# Patient Record
Sex: Male | Born: 1945
Health system: Southern US, Community
[De-identification: ages and names within clinical notes are randomized; demographics above are authoritative.]

## PROBLEM LIST (undated history)

## (undated) DIAGNOSIS — R011 Cardiac murmur, unspecified: Secondary | ICD-10-CM

## (undated) DIAGNOSIS — I509 Heart failure, unspecified: Secondary | ICD-10-CM

## (undated) DIAGNOSIS — M1990 Unspecified osteoarthritis, unspecified site: Secondary | ICD-10-CM

## (undated) DIAGNOSIS — R0902 Hypoxemia: Secondary | ICD-10-CM

## (undated) DIAGNOSIS — I2510 Atherosclerotic heart disease of native coronary artery without angina pectoris: Secondary | ICD-10-CM

## (undated) DIAGNOSIS — Z9289 Personal history of other medical treatment: Secondary | ICD-10-CM

## (undated) DIAGNOSIS — K5900 Constipation, unspecified: Secondary | ICD-10-CM

## (undated) DIAGNOSIS — H269 Unspecified cataract: Secondary | ICD-10-CM

## (undated) DIAGNOSIS — I10 Essential (primary) hypertension: Secondary | ICD-10-CM

## (undated) DIAGNOSIS — I4819 Other persistent atrial fibrillation: Secondary | ICD-10-CM

## (undated) DIAGNOSIS — E039 Hypothyroidism, unspecified: Secondary | ICD-10-CM

## (undated) DIAGNOSIS — G4733 Obstructive sleep apnea (adult) (pediatric): Secondary | ICD-10-CM

## (undated) DIAGNOSIS — C4490 Unspecified malignant neoplasm of skin, unspecified: Secondary | ICD-10-CM

## (undated) DIAGNOSIS — I340 Nonrheumatic mitral (valve) insufficiency: Secondary | ICD-10-CM

## (undated) DIAGNOSIS — Z86010 Personal history of colonic polyps: Secondary | ICD-10-CM

## (undated) DIAGNOSIS — I5030 Unspecified diastolic (congestive) heart failure: Secondary | ICD-10-CM

## (undated) DIAGNOSIS — E785 Hyperlipidemia, unspecified: Secondary | ICD-10-CM

## (undated) DIAGNOSIS — I7121 Aneurysm of the ascending aorta, without rupture: Secondary | ICD-10-CM

## (undated) DIAGNOSIS — I499 Cardiac arrhythmia, unspecified: Secondary | ICD-10-CM

## (undated) DIAGNOSIS — K759 Inflammatory liver disease, unspecified: Secondary | ICD-10-CM

## (undated) DIAGNOSIS — K635 Polyp of colon: Secondary | ICD-10-CM

## (undated) DIAGNOSIS — K922 Gastrointestinal hemorrhage, unspecified: Secondary | ICD-10-CM

## (undated) DIAGNOSIS — Z79899 Other long term (current) drug therapy: Secondary | ICD-10-CM

## (undated) DIAGNOSIS — I77810 Thoracic aortic ectasia: Secondary | ICD-10-CM

## (undated) DIAGNOSIS — I712 Thoracic aortic aneurysm, without rupture: Secondary | ICD-10-CM

## (undated) HISTORY — DX: Thoracic aortic aneurysm, without rupture: I71.2

## (undated) HISTORY — DX: Heart failure, unspecified: I50.9

## (undated) HISTORY — DX: Other persistent atrial fibrillation: I48.19

## (undated) HISTORY — DX: Gastrointestinal hemorrhage, unspecified: K92.2

## (undated) HISTORY — DX: Hypothyroidism, unspecified: E03.9

## (undated) HISTORY — DX: Inflammatory liver disease, unspecified: K75.9

## (undated) HISTORY — DX: Hypoxemia: R09.02

## (undated) HISTORY — DX: Essential (primary) hypertension: I10

## (undated) HISTORY — DX: Unspecified osteoarthritis, unspecified site: M19.90

## (undated) HISTORY — DX: Aneurysm of the ascending aorta, without rupture: I71.21

## (undated) HISTORY — DX: Cardiac murmur, unspecified: R01.1

## (undated) HISTORY — DX: Obstructive sleep apnea (adult) (pediatric): G47.33

## (undated) HISTORY — DX: Atherosclerotic heart disease of native coronary artery without angina pectoris: I25.10

## (undated) HISTORY — DX: Constipation, unspecified: K59.00

## (undated) HISTORY — PX: TONSILLECTOMY: SUR1361

## (undated) HISTORY — DX: Thoracic aortic ectasia: I77.810

## (undated) HISTORY — DX: Unspecified diastolic (congestive) heart failure: I50.30

## (undated) HISTORY — DX: Nonrheumatic mitral (valve) insufficiency: I34.0

## (undated) HISTORY — DX: Encounter for therapeutic drug level monitoring: Z79.899

## (undated) HISTORY — DX: Unspecified malignant neoplasm of skin, unspecified: C44.90

## (undated) HISTORY — DX: Personal history of colonic polyps: Z86.010

## (undated) HISTORY — DX: Hyperlipidemia, unspecified: E78.5

## (undated) HISTORY — DX: Unspecified cataract: H26.9

## (undated) HISTORY — DX: Polyp of colon: K63.5

## (undated) HISTORY — PX: COLONOSCOPY: SHX174

## (undated) HISTORY — DX: Personal history of other medical treatment: Z92.89

---

## 1980-08-06 HISTORY — PX: VASECTOMY: SHX75

## 1984-08-06 HISTORY — PX: CATARACT EXTRACTION: SUR2

## 1993-08-06 HISTORY — PX: CATARACT EXTRACTION: SUR2

## 1996-01-01 ENCOUNTER — Encounter: Payer: Self-pay | Admitting: Family Medicine

## 1996-05-13 ENCOUNTER — Encounter: Payer: Self-pay | Admitting: Family Medicine

## 1996-05-13 LAB — CONVERTED CEMR LAB: TSH: 6.84 microintl units/mL

## 1997-05-14 ENCOUNTER — Encounter: Payer: Self-pay | Admitting: Family Medicine

## 1997-06-06 HISTORY — PX: LIPOMA EXCISION: SHX5283

## 1997-08-11 ENCOUNTER — Encounter: Payer: Self-pay | Admitting: Family Medicine

## 1997-08-11 LAB — CONVERTED CEMR LAB
PSA: 0.7 ng/mL
TSH: 9.26 microintl units/mL

## 1997-11-26 ENCOUNTER — Encounter: Payer: Self-pay | Admitting: Family Medicine

## 1998-12-07 ENCOUNTER — Encounter: Payer: Self-pay | Admitting: Family Medicine

## 1998-12-07 LAB — CONVERTED CEMR LAB
Blood Glucose, Fasting: 89 mg/dL
TSH: 2.35 microintl units/mL

## 1999-12-05 ENCOUNTER — Encounter: Payer: Self-pay | Admitting: Family Medicine

## 2000-12-11 ENCOUNTER — Encounter: Payer: Self-pay | Admitting: Family Medicine

## 2000-12-11 LAB — CONVERTED CEMR LAB
RBC count: 4.5 10*6/uL
WBC, blood: 7.3 10*3/uL

## 2001-03-06 ENCOUNTER — Encounter: Payer: Self-pay | Admitting: Family Medicine

## 2001-03-06 LAB — CONVERTED CEMR LAB: TSH: 4.57 microintl units/mL

## 2001-12-19 ENCOUNTER — Encounter: Payer: Self-pay | Admitting: Family Medicine

## 2001-12-19 LAB — CONVERTED CEMR LAB
Blood Glucose, Fasting: 94 mg/dL
PSA: 0.7 ng/mL
TSH: 2.84 microintl units/mL

## 2002-12-17 ENCOUNTER — Encounter: Payer: Self-pay | Admitting: Family Medicine

## 2004-01-11 ENCOUNTER — Encounter: Payer: Self-pay | Admitting: Family Medicine

## 2004-01-11 LAB — CONVERTED CEMR LAB: PSA: 0.6 ng/mL

## 2005-02-02 ENCOUNTER — Ambulatory Visit: Payer: Self-pay | Admitting: Family Medicine

## 2005-02-02 LAB — CONVERTED CEMR LAB: TSH: 6.29 microintl units/mL

## 2005-02-05 ENCOUNTER — Ambulatory Visit: Payer: Self-pay | Admitting: Family Medicine

## 2005-02-27 ENCOUNTER — Ambulatory Visit: Payer: Self-pay | Admitting: Family Medicine

## 2005-07-20 ENCOUNTER — Ambulatory Visit: Payer: Self-pay | Admitting: Family Medicine

## 2005-07-20 LAB — CONVERTED CEMR LAB: TSH: 2.05 microintl units/mL

## 2006-02-12 ENCOUNTER — Ambulatory Visit: Payer: Self-pay | Admitting: Family Medicine

## 2006-02-12 LAB — CONVERTED CEMR LAB
Blood Glucose, Fasting: 90 mg/dL
PSA: 0.68 ng/mL
TSH: 0.81 microintl units/mL

## 2006-02-15 ENCOUNTER — Ambulatory Visit: Payer: Self-pay | Admitting: Family Medicine

## 2006-02-27 ENCOUNTER — Ambulatory Visit: Payer: Self-pay | Admitting: Family Medicine

## 2007-02-21 ENCOUNTER — Ambulatory Visit: Payer: Self-pay | Admitting: Family Medicine

## 2007-02-21 DIAGNOSIS — I10 Essential (primary) hypertension: Secondary | ICD-10-CM | POA: Insufficient documentation

## 2007-02-21 DIAGNOSIS — M479 Spondylosis, unspecified: Secondary | ICD-10-CM | POA: Insufficient documentation

## 2007-02-21 DIAGNOSIS — E7800 Pure hypercholesterolemia, unspecified: Secondary | ICD-10-CM | POA: Insufficient documentation

## 2007-02-26 ENCOUNTER — Ambulatory Visit: Payer: Self-pay | Admitting: Family Medicine

## 2007-02-26 ENCOUNTER — Telehealth: Payer: Self-pay | Admitting: Family Medicine

## 2007-02-26 LAB — CONVERTED CEMR LAB
AST: 15 units/L (ref 0–37)
Bilirubin, Direct: 0.1 mg/dL (ref 0.0–0.3)
Chloride: 107 meq/L (ref 96–112)
Cholesterol: 184 mg/dL (ref 0–200)
Creatinine, Ser: 1.1 mg/dL (ref 0.4–1.5)
Free T4: 1.1 ng/dL (ref 0.6–1.6)
Glucose, Bld: 90 mg/dL (ref 70–99)
HDL: 30.6 mg/dL — ABNORMAL LOW (ref >39.0)
Sodium: 142 meq/L (ref 135–145)
Total Bilirubin: 1 mg/dL (ref 0.3–1.2)
Total Protein: 6.3 g/dL (ref 6.0–8.3)
Triglycerides: 129 mg/dL (ref 0–149)

## 2007-08-18 ENCOUNTER — Telehealth (INDEPENDENT_AMBULATORY_CARE_PROVIDER_SITE_OTHER): Payer: Self-pay | Admitting: *Deleted

## 2007-09-30 ENCOUNTER — Encounter: Payer: Self-pay | Admitting: Family Medicine

## 2007-10-09 ENCOUNTER — Encounter: Payer: Self-pay | Admitting: Family Medicine

## 2008-02-16 ENCOUNTER — Ambulatory Visit: Payer: Self-pay | Admitting: Family Medicine

## 2008-02-16 LAB — CONVERTED CEMR LAB
ALT: 11 units/L (ref 0–53)
Basophils Relative: 0.6 % (ref 0.0–1.0)
Bilirubin, Direct: 0.1 mg/dL (ref 0.0–0.3)
CO2: 29 meq/L (ref 19–32)
Calcium: 9.6 mg/dL (ref 8.4–10.5)
Glucose, Bld: 92 mg/dL (ref 70–99)
Hemoglobin: 14.9 g/dL (ref 13.0–17.0)
LDL Cholesterol: 122 mg/dL — ABNORMAL HIGH (ref 0–99)
Lymphocytes Relative: 44.2 % (ref 12.0–46.0)
Monocytes Relative: 7.6 % (ref 3.0–12.0)
Neutro Abs: 3.4 10*3/uL (ref 1.4–7.7)
PSA: 0.92 ng/mL (ref 0.10–4.00)
RBC: 4.89 M/uL (ref 4.22–5.81)
RDW: 13 % (ref 11.5–14.6)
Sodium: 143 meq/L (ref 135–145)
Total CHOL/HDL Ratio: 5.4
Total Protein: 6.6 g/dL (ref 6.0–8.3)
WBC: 7.4 10*3/uL (ref 4.5–10.5)

## 2008-02-19 ENCOUNTER — Ambulatory Visit: Payer: Self-pay | Admitting: Family Medicine

## 2008-02-23 ENCOUNTER — Encounter: Payer: Self-pay | Admitting: Family Medicine

## 2008-02-24 DIAGNOSIS — E039 Hypothyroidism, unspecified: Secondary | ICD-10-CM | POA: Insufficient documentation

## 2008-03-11 ENCOUNTER — Ambulatory Visit: Payer: Self-pay | Admitting: Family Medicine

## 2008-03-11 LAB — CONVERTED CEMR LAB
OCCULT 1: NEGATIVE
OCCULT 2: NEGATIVE
OCCULT 3: NEGATIVE

## 2008-11-03 ENCOUNTER — Telehealth: Payer: Self-pay | Admitting: Family Medicine

## 2009-02-21 ENCOUNTER — Ambulatory Visit: Payer: Self-pay | Admitting: Family Medicine

## 2009-02-21 LAB — CONVERTED CEMR LAB
ALT: 9 units/L (ref 0–53)
AST: 13 units/L (ref 0–37)
Albumin: 3.6 g/dL (ref 3.5–5.2)
Alkaline Phosphatase: 61 units/L (ref 39–117)
Bilirubin, Direct: 0.2 mg/dL (ref 0.0–0.3)
CO2: 30 meq/L (ref 19–32)
Chloride: 109 meq/L (ref 96–112)
Glucose, Bld: 86 mg/dL (ref 70–99)
HDL: 36.6 mg/dL — ABNORMAL LOW (ref >39.00)
Potassium: 4.8 meq/L (ref 3.5–5.1)
Sodium: 143 meq/L (ref 135–145)
Total Protein: 6.3 g/dL (ref 6.0–8.3)

## 2009-02-23 ENCOUNTER — Ambulatory Visit: Payer: Self-pay | Admitting: Family Medicine

## 2009-03-30 ENCOUNTER — Encounter (INDEPENDENT_AMBULATORY_CARE_PROVIDER_SITE_OTHER): Payer: Self-pay | Admitting: *Deleted

## 2009-03-30 ENCOUNTER — Ambulatory Visit: Payer: Self-pay | Admitting: Family Medicine

## 2009-03-30 LAB — CONVERTED CEMR LAB
OCCULT 2: NEGATIVE
OCCULT 3: NEGATIVE

## 2009-05-19 ENCOUNTER — Ambulatory Visit: Payer: Self-pay | Admitting: Family Medicine

## 2009-05-19 LAB — CONVERTED CEMR LAB
CO2: 29 meq/L (ref 19–32)
Chloride: 103 meq/L (ref 96–112)
Sodium: 140 meq/L (ref 135–145)

## 2009-05-26 ENCOUNTER — Ambulatory Visit: Payer: Self-pay | Admitting: Family Medicine

## 2010-01-16 ENCOUNTER — Ambulatory Visit: Payer: Self-pay | Admitting: Family Medicine

## 2010-01-16 LAB — CONVERTED CEMR LAB
ALT: 10 units/L (ref 0–53)
AST: 14 units/L (ref 0–37)
Alkaline Phosphatase: 55 units/L (ref 39–117)
BUN: 21 mg/dL (ref 6–23)
Chloride: 112 meq/L (ref 96–112)
Cholesterol: 187 mg/dL (ref 0–200)
Free T4: 1.19 ng/dL (ref 0.60–1.60)
GFR calc non Af Amer: 66.38 mL/min (ref >60)
Glucose, Bld: 85 mg/dL (ref 70–99)
LDL Cholesterol: 123 mg/dL — ABNORMAL HIGH (ref 0–99)
PSA: 0.97 ng/mL (ref 0.10–4.00)
Potassium: 4.8 meq/L (ref 3.5–5.1)
Sodium: 144 meq/L (ref 135–145)
TSH: 5.33 microintl units/mL (ref 0.35–5.50)
Total Bilirubin: 0.9 mg/dL (ref 0.3–1.2)
VLDL: 20.8 mg/dL (ref 0.0–40.0)

## 2010-01-23 ENCOUNTER — Ambulatory Visit: Payer: Self-pay | Admitting: Family Medicine

## 2010-03-08 ENCOUNTER — Encounter (INDEPENDENT_AMBULATORY_CARE_PROVIDER_SITE_OTHER): Payer: Self-pay | Admitting: *Deleted

## 2010-06-16 ENCOUNTER — Ambulatory Visit: Payer: Self-pay | Admitting: Family Medicine

## 2010-06-19 LAB — CONVERTED CEMR LAB
ALT: 11 units/L (ref 0–53)
AST: 14 units/L (ref 0–37)
Albumin: 4.1 g/dL (ref 3.5–5.2)
Alkaline Phosphatase: 60 units/L (ref 39–117)
BUN: 15 mg/dL (ref 6–23)
Bilirubin, Direct: 0.2 mg/dL (ref 0.0–0.3)
CO2: 28 meq/L (ref 19–32)
Calcium: 9.7 mg/dL (ref 8.4–10.5)
Chloride: 107 meq/L (ref 96–112)
Cholesterol: 198 mg/dL (ref 0–200)
Creatinine, Ser: 1.3 mg/dL (ref 0.4–1.5)
Free T4: 1.29 ng/dL (ref 0.60–1.60)
GFR calc non Af Amer: 62.02 mL/min (ref >60)
Glucose, Bld: 91 mg/dL (ref 70–99)
HDL: 46.6 mg/dL (ref >39.00)
LDL Cholesterol: 125 mg/dL — ABNORMAL HIGH (ref 0–99)
PSA: 1.12 ng/mL (ref 0.10–4.00)
Potassium: 5 meq/L (ref 3.5–5.1)
Sodium: 141 meq/L (ref 135–145)
TSH: 8.7 microintl units/mL — ABNORMAL HIGH (ref 0.35–5.50)
Total Bilirubin: 1.1 mg/dL (ref 0.3–1.2)
Total CHOL/HDL Ratio: 4
Total Protein: 6.6 g/dL (ref 6.0–8.3)
Triglycerides: 134 mg/dL (ref 0.0–149.0)
VLDL: 26.8 mg/dL (ref 0.0–40.0)

## 2010-06-21 ENCOUNTER — Ambulatory Visit: Payer: Self-pay | Admitting: Family Medicine

## 2010-06-26 ENCOUNTER — Ambulatory Visit: Payer: Self-pay | Admitting: Family Medicine

## 2010-06-26 LAB — FECAL OCCULT BLOOD, GUAIAC: Fecal Occult Blood: NEGATIVE

## 2010-06-28 ENCOUNTER — Encounter (INDEPENDENT_AMBULATORY_CARE_PROVIDER_SITE_OTHER): Payer: Self-pay | Admitting: *Deleted

## 2010-07-05 ENCOUNTER — Ambulatory Visit: Payer: Self-pay | Admitting: Family Medicine

## 2010-08-26 ENCOUNTER — Encounter: Payer: Self-pay | Admitting: Orthopedic Surgery

## 2010-09-05 NOTE — Letter (Signed)
Summary: [facility]: Immunoassay Fecal Occult Blood (iFOB) Order Form  [place] at [facility]  [address] [place], [place] [address]   Phone: [phone]  Fax: [phone]      [facility]: Immunoassay Fecal Occult Blood (iFOB) Order Form   June 21, 2010 MRN: [id number]   JHOAN SCHMIEDER 10-Aug-1945   Physicican Name:____Robert Liana Crocker MD  June 21, 2010 9:01 AM _____________________  Diagnosis Code:______________v70.0____________      Shaune Leeks MD

## 2010-09-05 NOTE — Assessment & Plan Note (Signed)
Summary: HIGH BP/DLO   Vital Signs:  Patient profile:   65 year old male Height:      72 inches Weight:      262.50 pounds BMI:     35.73 Temp:     96.8 degrees F oral Pulse rate:   60 / minute Pulse rhythm:   regular BP sitting:   140 / 88  (right arm) Cuff size:   large  Vitals Entered By: Linde Gillis CMA Duncan Dull) (July 05, 2010 11:41 AM) CC: blood pressure    History of Present Illness: Pt here for recheck of his BP. He got a machine for home monitoring and has found his pressure to be all over the place.Marland Kitchen..140-160s/80s. He  is not taking any OTCs and is taking his other meds as rx'd. He has no urinary sxs typically and feels well.  Problems Prior to Update: 1)  Special Screening Malig Neoplasms Other Sites  (ICD-V76.49) 2)  Screening For Malignant Neoplasm, Prostate  (ICD-V76.44) 3)  Degenerative Joint Disease, Cervical Spine  (ICD-721.90) 4)  Hypothyroidism Nos  (ICD-244.9) 5)  Hypertension, Benign Essential  (ICD-401.1) 6)  Hypercholesterolemia  (ICD-272.0) 7)  Health Maintenance Exam  (ICD-V70.0)  Medications Prior to Update: 1)  Ramipril 10 Mg Caps (Ramipril) .... One Tab By Mouth Once Daily 2)  Adult Aspirin Low Strength 81 Mg  Tbdp (Aspirin) .Marland Kitchen.. 1 Daily By Mouth 3)  Levoxyl 137 Mcg Tabs (Levothyroxine Sodium) .... Onbe Tab By Mouth Once Daily 4)  Simvastatin 40 Mg Tabs (Simvastatin) .... One Tab By Mouth At Night 5)  Doxazosin Mesylate 2 Mg  Tabs (Doxazosin Mesylate) .Marland Kitchen.. 1 By Mouth Daily  Allergies: 1)  ! Sulfa  Physical Exam  General:  Well-developed,well-nourished,in no acute distress; alert,appropriate and cooperative throughout examination, slightly overweight. Head:  Normocephalic and atraumatic without obvious abnormalities. No apparent alopecia or balding. Sinuses NT. Eyes:  Conjunctiva clear bilaterally.  Ears:  External ear exam shows no significant lesions or deformities.  Otoscopic examination reveals clear canals, tympanic membranes are intact  bilaterally without bulging, retraction, inflammation or discharge. Hearing is grossly normal bilaterally. Nose:  External nasal examination shows no deformity or inflammation. Nasal mucosa are pink and moist without lesions or exudates. Mouth:  Oral mucosa and oropharynx without lesions or exudates.  Teeth in good repair. Neck:  No deformities, masses, or tenderness noted. Lungs:  Normal respiratory effort, chest expands symmetrically. Lungs are clear to auscultation, no crackles or wheezes. Heart:  Normal rate and regular rhythm. S1 and S2 normal without gallop, murmur, click, rub or other extra sounds.   Impression & Recommendations:  Problem # 1:  HYPERTENSION, BENIGN ESSENTIAL (ICD-401.1) Assessment Unchanged Still high and appears to be as outpt also. Will incr Doxaz to 4mg  and cont to monitor. Can go up on his own in 6 wks if conts high.  Will see back in 3 mos. His updated medication list for this problem includes:    Ramipril 10 Mg Caps (Ramipril) ..... One tab by mouth once daily    Doxazosin Mesylate 2 Mg Tabs (Doxazosin mesylate) .Marland Kitchen... 1 by mouth daily    Doxazosin Mesylate 4 Mg Tabs (Doxazosin mesylate) ..... One tab by mouth once daily  BP today: 140/88 Prior BP: 140/78 (06/21/2010)  Labs Reviewed: K+: 5.0 (06/16/2010) Creat: : 1.3 (06/16/2010)   Chol: 198 (06/16/2010)   HDL: 46.60 (06/16/2010)   LDL: 125 (06/16/2010)   TG: 134.0 (06/16/2010)  Complete Medication List: 1)  Ramipril 10 Mg Caps (Ramipril) .... One  tab by mouth once daily 2)  Adult Aspirin Low Strength 81 Mg Tbdp (Aspirin) .Marland Kitchen.. 1 daily by mouth 3)  Levoxyl 137 Mcg Tabs (Levothyroxine sodium) .... Onbe tab by mouth once daily 4)  Simvastatin 40 Mg Tabs (Simvastatin) .... One tab by mouth at night 5)  Doxazosin Mesylate 2 Mg Tabs (Doxazosin mesylate) .Marland Kitchen.. 1 by mouth daily 6)  Doxazosin Mesylate 4 Mg Tabs (Doxazosin mesylate) .... One tab by mouth once daily  Patient Instructions: 1)  RTC 3mos for BP  check. Prescriptions: DOXAZOSIN MESYLATE 4 MG TABS (DOXAZOSIN MESYLATE) one tab by mouth once daily  #90 x 3   Entered and Authorized by:   Shaune Leeks MD   Signed by:   Shaune Leeks MD on 07/05/2010   Method used:   Print then Give to Patient   RxID:   [id number]    Orders Added: 1)  Est. Patient Level III [21308]     Current Allergies (reviewed today): ! SULFA

## 2010-09-05 NOTE — Letter (Signed)
Summary: Results Follow up Letter   at [facility]  [address] [place], [place] [address]   Phone: [phone]  Fax: [phone]    06/28/2010 MRN: [id number]     James [name] [address] [place], [place]  [address]     Dear [name],  The following are the results of your recent test(s):  Test         Result    Pap Smear:        Normal _____  Not Normal _____ Comments: ______________________________________________________ Cholesterol: LDL(Bad cholesterol):         Your goal is less than:         HDL (Good cholesterol):       Your goal is more than: Comments:  ______________________________________________________ Mammogram:        Normal _____  Not Normal _____ Comments:  ___________________________________________________________________ Hemoccult:        Normal __X___  Not normal _______ Comments:  There was no blood in your stool. Please follow-up in one             year.  _____________________________________________________________________ Other Tests:    We routinely do not discuss normal results over the telephone.  If you desire a copy of the results, or you have any questions about this information we can discuss them at your next office visit.   Sincerely,     Laurita Quint, [facility]

## 2010-09-05 NOTE — Letter (Signed)
Summary: Nadara Eaton letter  [place] at [facility]  [address] [place], [place] [address]   Phone: [phone]  Fax: [phone]       03/08/2010 MRN: [id number]  NICOLAS BANH [address] [place], [place]  [address]  Dear Mr. [facility] [place] and [place] announce the retirement of Arta Silence, M.D., from full-time practice at the [facility] office effective February 02, 2010 and his plans of returning part-time.  It is important to Dr. Hetty Ely and to our practice that you understand that [facility] has seven physicians in our office for your health care needs.  We will continue to offer the same exceptional care that you have today.    Dr. Hetty Ely has spoken to many of you about his plans for retirement and returning part-time in the fall.   We will continue to work with you through the transition to schedule appointments for you in the office and meet the high standards that [facility] is committed to.   Again, it is with great pleasure that we share the news that Dr. Hetty Ely will return to [facility] at [facility] in October of 2011 with a reduced schedule.    If you have any questions, or would like to request an appointment with one of our physicians, please call us at [phone] and press the option for Scheduling an appointment.  We take pleasure in providing you with excellent patient care and look forward to seeing you at your next office visit.  Our [facility] Physicians are:  Tillman Abide, M.D. Laurita Quint, M.D. Roxy Manns, M.D. Kerby Nora, M.D. Hannah Beat, M.D. Ruthe Mannan, M.D. We proudly welcomed Raechel Ache, M.D. and Eustaquio Boyden, M.D. to the practice in July/August 2011.  Sincerely,  [facility]

## 2010-09-05 NOTE — Assessment & Plan Note (Signed)
Summary: CPX   Vital Signs:  Patient profile:   65 year old male Weight:      263.8 pounds Temp:     98.0 degrees F oral Pulse rate:   68 / minute Pulse rhythm:   regular BP sitting:   140 / 78  (left arm) Cuff size:   large  Vitals Entered By: Sydell Axon LPN (June 21, 2010 8:19 AM) CC: 30 Minute checkup, declined flu vaccine   History of Present Illness: Pt here for Comp Exam, BP up slightly. He had a couple cups of coffee this AM but is usual routine.  Preventive Screening-Counseling & Management  Alcohol-Tobacco     Alcohol drinks/day: <1     Alcohol type: wine     Smoking Status: quit     Packs/Day: 1985     Year Quit: 1982     Pack years: 45     Passive Smoke Exposure: no  Caffeine-Diet-Exercise     Caffeine use/day: 2     Does Patient Exercise: yes     Type of exercise: tennis     Times/week: 3  Problems Prior to Update: 1)  Special Screening Malig Neoplasms Other Sites  (ICD-V76.49) 2)  Screening For Malignant Neoplasm, Prostate  (ICD-V76.44) 3)  Degenerative Joint Disease, Cervical Spine  (ICD-721.90) 4)  Hypothyroidism Nos  (ICD-244.9) 5)  Hypertension, Benign Essential  (ICD-401.1) 6)  Hypercholesterolemia  (ICD-272.0) 7)  Health Maintenance Exam  (ICD-V70.0)  Medications Prior to Update: 1)  Ramipril 10 Mg Caps (Ramipril) .... One Tab By Mouth Once Daily 2)  Adult Aspirin Low Strength 81 Mg  Tbdp (Aspirin) .Marland Kitchen.. 1 Daily By Mouth 3)  Levoxyl 125 Mcg  Tabs (Levothyroxine Sodium) .Marland Kitchen.. 1daily By Mouth 4)  Simvastatin 40 Mg Tabs (Simvastatin) .... One Tab By Mouth At Night 5)  Doxazosin Mesylate 2 Mg  Tabs (Doxazosin Mesylate) .Marland Kitchen.. 1 By Mouth Daily  Current Medications (verified): 1)  Ramipril 10 Mg Caps (Ramipril) .... One Tab By Mouth Once Daily 2)  Adult Aspirin Low Strength 81 Mg  Tbdp (Aspirin) .Marland Kitchen.. 1 Daily By Mouth 3)  Levoxyl 125 Mcg  Tabs (Levothyroxine Sodium) .Marland Kitchen.. 1daily By Mouth 4)  Simvastatin 40 Mg Tabs (Simvastatin) .... One Tab By  Mouth At Night 5)  Doxazosin Mesylate 2 Mg  Tabs (Doxazosin Mesylate) .Marland Kitchen.. 1 By Mouth Daily  Allergies: 1)  ! 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 Married LIVES WITH WIFE CHILDREN : 1 DAUGHTER  Risk Factors: Alcohol Use: <1 (06/21/2010) Caffeine Use: 2 (06/21/2010) Exercise: yes (06/21/2010)  Risk Factors: Smoking Status: quit (06/21/2010) Packs/Day: 1985 (06/21/2010) Passive Smoke Exposure: no (06/21/2010)  Family History: Father dec 32  MVA Mother A 65  Joint repl other MSK comps Numbness of hands Brother A  61 CV: +PGF MI OLD AGE HBP: + SELF DM: NEGTIVE GOUT/ARTHRITIS: PROSTATE CANCER:  BREAST/OVARIAN/UTERINE CANCER: + MGF LUNG CANCER:(SMOKER) COLON CANCER: DEPRESSION: NEGATIVE ETOH/DRUG ABUSE: NEGATIVE  OTHER: + STROKE MGF  Social History: 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 Married LIVES WITH WIFE CHILDREN : 1 DAUGHTER  Review of Systems General:  Denies chills, fatigue, fever, sweats, weakness, and weight loss. Eyes:  Denies blurring, discharge, and eye pain. ENT:  Complains of ringing in ears; denies decreased hearing and earache; chronic. CV:  Denies chest pain or discomfort, fainting, fatigue, palpitations, shortness of breath with exertion, swelling of feet, and swelling of  hands. Resp:  Denies cough, shortness of breath, and wheezing. GI:  Denies abdominal pain, bloody stools, change in bowel habits, constipation, dark tarry stools, diarrhea, indigestion, loss of appetite, nausea, vomiting, vomiting blood, and yellowish skin color. GU:  Denies discharge, dysuria, nocturia, and urinary frequency. MS:  Complains of low back pain; denies joint pain, muscle aches, cramps, and stiffness; occas right hip pain. Derm:  Denies dryness, itching, and rash. Neuro:  Denies numbness, poor balance, tingling, and tremors.  Physical Exam  General:  Well-developed,well-nourished,in no acute distress; alert,appropriate and cooperative throughout examination, slightly overweight. Head:  Normocephalic and atraumatic without obvious abnormalities. No apparent alopecia or balding. Sinuses NT. Eyes:  Conjunctiva clear bilaterally.  Ears:  External ear exam shows no significant lesions or deformities.  Otoscopic examination reveals clear canals, tympanic membranes are intact bilaterally without bulging, retraction, inflammation or discharge. Hearing is grossly normal bilaterally. Nose:  External nasal examination shows no deformity or inflammation. Nasal mucosa are pink and moist without lesions or exudates. Mouth:  Oral mucosa and oropharynx without lesions or exudates.  Teeth in good repair. Neck:  No deformities, masses, or tenderness noted. Chest Wall:  No deformities, masses, tenderness or gynecomastia noted. Breasts:  No masses or gynecomastia noted Lungs:  Normal respiratory effort, chest expands symmetrically. Lungs are clear to auscultation, no crackles or wheezes. Heart:  Normal rate and regular rhythm. S1 and S2 normal without gallop, murmur, click, rub or other extra sounds. Abdomen:  Bowel sounds positive,abdomen soft and non-tender without masses, organomegaly  noted. Ventral hernia above the umbilicus  that does not appear to be through and through defect. Rectal:  No  external abnormalities noted. Normal sphincter tone. No rectal masses or tenderness. G neg. Genitalia:  Testes bilaterally descended without nodularity, tenderness or masses. No scrotal masses or lesions. No penis lesions or urethral discharge. Prostate:  Prostate gland firm and smooth, no enlargement, nodularity, tenderness, mass, asymmetry or induration. 10gms. Msk:  No deformity or scoliosis noted of thoracic or lumbar spine.   Pulses:  R and L carotid,radial,femoral,dorsalis pedis and posterior tibial pulses are full and equal bilaterally Extremities:  No clubbing, cyanosis, edema, or deformity noted with normal full range of motion of all joints.   Neurologic:  No cranial nerve deficits noted. Station and gait are normal. Sensory, motor and coordinative functions appear intact. Skin:  Intact without suspicious lesions or rashes. Cervical Nodes:  No lymphadenopathy noted Inguinal Nodes:  No significant adenopathy Psych:  Cognition and judgment appear intact. Alert and cooperative with normal attention span and concentration. No apparent delusions, illusions, hallucinations   Impression & Recommendations:  Problem # 1:  HEALTH MAINTENANCE EXAM (ICD-V70.0)  Reviewed preventive care protocols, scheduled due services, and updated immunizations. Up to date.  Problem # 2:  HYPERTENSION, BENIGN ESSENTIAL (ICD-401.1) Assessment: Deteriorated Elevated today, 155/90 by me. Get machine and monitor for 6 weeks then bring nos in to reassess. Has done well on just Doxasocin. His updated medication list for this problem includes:  Ramipril 10 Mg Caps (Ramipril) ..... One tab by mouth once daily    Doxazosin Mesylate 2 Mg Tabs (Doxazosin mesylate) .Marland Kitchen... 1 by mouth daily  BP today: 140/78 Prior BP: 120/74 (01/23/2010)  Labs Reviewed: K+: 5.0 (06/16/2010) Creat: : 1.3 (06/16/2010)   Chol: 198 (06/16/2010)   HDL: 46.60 (06/16/2010)   LDL: 125 (06/16/2010)   TG: 134.0 (06/16/2010)  Problem # 3:   SCREENING FOR MALIGNANT NEOPLASM, PROSTATE (ICD-V76.44) Assessment: Unchanged Stable PSA and exam.  Problem # 4:  HYPOTHYROIDISM NOS (ICD-244.9) Assessment: Deteriorated TSH high, will increase Levoxyl to 137 and recheck. His updated medication list for this problem includes:    Levoxyl 137 Mcg Tabs (Levothyroxine sodium) ..... Onbe tab by mouth once daily  Labs Reviewed: TSH: 8.70 (06/16/2010)    Chol: 198 (06/16/2010)   HDL: 46.60 (06/16/2010)   LDL: 125 (06/16/2010)   TG: 134.0 (06/16/2010)  Problem # 5:  HYPERCHOLESTEROLEMIA (ICD-272.0) Assessment: Unchanged OK, not great, watch intake. His updated medication list for this problem includes:    Simvastatin 40 Mg Tabs (Simvastatin) ..... One tab by mouth at night  Labs Reviewed: SGOT: 14 (06/16/2010)   SGPT: 11 (06/16/2010)   HDL:46.60 (06/16/2010), 43.30 (01/16/2010)  LDL:125 (06/16/2010), 123 (01/16/2010)  Chol:198 (06/16/2010), 187 (01/16/2010)  Trig:134.0 (06/16/2010), 104.0 (01/16/2010)  Complete Medication List: 1)  Ramipril 10 Mg Caps (Ramipril) .... One tab by mouth once daily 2)  Adult Aspirin Low Strength 81 Mg Tbdp (Aspirin) .Marland Kitchen.. 1 daily by mouth 3)  Levoxyl 137 Mcg Tabs (Levothyroxine sodium) .... Onbe tab by mouth once daily 4)  Simvastatin 40 Mg Tabs (Simvastatin) .... One tab by mouth at night 5)  Doxazosin Mesylate 2 Mg Tabs (Doxazosin mesylate) .Marland Kitchen.. 1 by mouth daily  Patient Instructions: 1)  RTC 6 mos for Thyroid recheck, TSH, T4Fr 244.9 Check BP also. 2)  RTC 6 weeks to recheck BP  Prescriptions: LEVOXYL 137 MCG TABS (LEVOTHYROXINE SODIUM) onbe tab by mouth once daily  #90 x 3   Entered and Authorized by:   Shaune Leeks MD   Signed by:   Shaune Leeks MD on 06/21/2010   Method used:   Electronically to        [facility]* (retail)             ,          Ph: [phone]       Fax: [phone]   RxID:   [id number]    Orders Added: 1)  Est. Patient 40-64 years  [[id number]]    Current Allergies (reviewed today): ! SULFA

## 2010-09-05 NOTE — Assessment & Plan Note (Signed)
Summary: James [name]   Vital Signs:  Patient profile:   65 year old male Weight:      261.75 pounds BMI:     35.63 Temp:     98.0 degrees F oral Pulse rate:   64 / minute Pulse rhythm:   regular BP sitting:   120 / 74  (left arm) Cuff size:   large  Vitals Entered By: Sydell Axon LPN (James 20, 2011 7:58 AM) CC: Follow-up after labs   History of Present Illness: Pty here for 6 month followup. He has low back SI joint area discomfort. We discussed SI pathophysiology. Warm up well. He would like to cahange from Lipitor if poss. He is on 10 mg with good response altho LDL in 130 range. He has had no problems with it.  Preventive Screening-Counseling & Management  Alcohol-Tobacco     Alcohol drinks/day: <1     Alcohol type: wine     Smoking Status: quit     Packs/Day: 1985     Year Quit: 1982     Pack years: 45     Passive Smoke Exposure: no  Caffeine-Diet-Exercise     Caffeine use/day: 2     Does Patient Exercise: yes     Type of exercise: tennis     Times/week: 3  Problems Prior to Update: 1)  Special Screening Malig Neoplasms Other Sites  (ICD-V76.49) 2)  Screening For Malignant Neoplasm, Prostate  (ICD-V76.44) 3)  Degenerative Joint Disease, Cervical Spine  (ICD-721.90) 4)  Hypothyroidism Nos  (ICD-244.9) 5)  Hypertension, Benign Essential  (ICD-401.1) 6)  Hypercholesterolemia  (ICD-272.0) 7)  Health Maintenance Exam  (ICD-V70.0)  Medications Prior to Update: 1)  Ramipril 10 Mg Caps (Ramipril) .... One Tab By Mouth Once Daily 2)  Adult Aspirin Low Strength 81 Mg  Tbdp (Aspirin) .Marland Kitchen.. 1 Daily By Mouth 3)  Levoxyl 125 Mcg  Tabs (Levothyroxine Sodium) .Marland Kitchen.. 1daily By Mouth 4)  Lipitor 10 Mg  Tabs (Atorvastatin Calcium) .Marland Kitchen.. 1 By Mouth Daily 5)  Doxazosin Mesylate 2 Mg  Tabs (Doxazosin Mesylate) .Marland Kitchen.. 1 By Mouth Daily  Allergies: 1)  ! 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 Married LIVES WITH WIFE CHILDREN : 1 DAUGHTER  Risk Factors: Alcohol Use: <1 (01/23/2010) Caffeine Use: 2 (01/23/2010) Exercise: yes (01/23/2010)  Risk Factors: Smoking Status: quit (01/23/2010) Packs/Day: 1985 (01/23/2010) Passive Smoke Exposure: no (01/23/2010)  Physical Exam  General:  Well-developed,well-nourished,in no acute distress; alert,appropriate and cooperative throughout examination, slightly overweight. Head:  Normocephalic and atraumatic without obvious abnormalities. No apparent alopecia or balding. Sinuses NT. Eyes:  Conjunctiva clear bilaterally.  Ears:  External ear exam shows no significant lesions or deformities.  Otoscopic examination reveals clear canals, tympanic membranes are intact bilaterally without bulging, retraction, inflammation or discharge. Hearing is grossly normal bilaterally. Nose:  External nasal examination shows no deformity or inflammation. Nasal mucosa are pink and moist without lesions or exudates. Mouth:  Oral mucosa and oropharynx without  lesions or exudates.  Teeth in good repair. Neck:  No deformities, masses, or tenderness noted. Chest Wall:  No deformities, masses, tenderness or gynecomastia noted. Breasts:  No masses or gynecomastia noted Lungs:  Normal respiratory effort, chest expands symmetrically. Lungs are clear to auscultation, no crackles or wheezes. Heart:  Normal rate and regular rhythm. S1 and S2 normal without  gallop, murmur, click, rub or other extra sounds. Abdomen:  Bowel sounds positive,abdomen soft and non-tender without masses, organomegaly  noted. Ventral hernia above the umbilicus  that does not appear to be through and through defect. Msk:  No deformity or scoliosis noted of thoracic or lumbar spine.   Pulses:  R and L carotid,radial,femoral,dorsalis pedis and posterior tibial pulses are full and equal bilaterally Extremities:  No clubbing, cyanosis, edema, or deformity noted with normal full range of motion of all joints.   Neurologic:  No cranial nerve deficits noted. Station and gait are normal. Sensory, motor and coordinative functions appear intact. Skin:  Intact without suspicious lesions or rashes, recent biopsy scar left ant lower neck. Cervical Nodes:  No lymphadenopathy noted Inguinal Nodes:  No significant adenopathy Psych:  Cognition and judgment appear intact. Alert and cooperative with normal attention span and concentration. No apparent delusions, illusions, hallucinations   Impression & Recommendations:  Problem # 1:  DEGENERATIVE JOINT DISEASE, CERVICAL SPINE (ICD-721.90) Assessment Unchanged Stable.  Problem # 2:  HYPOTHYROIDISM NOS (ICD-244.9) Assessment: Unchanged  Stable on curr dose. Cont script sent in. His updated medication list for this problem includes:    Levoxyl 125 Mcg Tabs (Levothyroxine sodium) .Marland Kitchen... 1daily by mouth  Labs Reviewed: TSH: 5.33 (01/16/2010)    Chol: 187 (01/16/2010)   HDL: 43.30 (01/16/2010)   LDL: 123 (01/16/2010)   TG: 104.0 (01/16/2010)  Problem # 3:  HYPERTENSION, BENIGN ESSENTIAL (ICD-401.1) Assessment: Unchanged Well controlled. Cont curr med. Script sent in. His updated medication list for this problem includes:    Ramipril 10 Mg Caps (Ramipril) ..... One tab by mouth once daily    Doxazosin Mesylate 2 Mg Tabs (Doxazosin mesylate) .Marland Kitchen... 1 by mouth daily  BP today: 120/74 Prior BP: 130/80 (05/26/2009)  Labs Reviewed: K+:  4.8 (01/16/2010) Creat: : 1.2 (01/16/2010)   Chol: 187 (01/16/2010)   HDL: 43.30 (01/16/2010)   LDL: 123 (01/16/2010)   TG: 104.0 (01/16/2010)  Problem # 4:  HYPERCHOLESTEROLEMIA (ICD-272.0) Assessment: Unchanged  OK.   Changed to Simvastatin at pt request. Recheck at end of year. His updated medication list for this problem includes:    Simvastatin 40 Mg Tabs (Simvastatin) ..... One tab by mouth at night  Labs Reviewed: SGOT: 14 (01/16/2010)   SGPT: 10 (01/16/2010)   HDL:43.30 (01/16/2010), 36.60 (02/21/2009)  LDL:123 (01/16/2010), 105 (02/21/2009)  Chol:187 (01/16/2010), 161 (02/21/2009)  Trig:104.0 (01/16/2010), 95.0 (02/21/2009)  Complete Medication List: 1)  Ramipril 10 Mg Caps (Ramipril) .... One tab by mouth once daily 2)  Adult Aspirin Low Strength 81 Mg Tbdp (Aspirin) .Marland Kitchen.. 1 daily by mouth 3)  Levoxyl 125 Mcg Tabs (Levothyroxine sodium) .Marland Kitchen.. 1daily by mouth 4)  Simvastatin 40 Mg Tabs (Simvastatin) .... One tab by mouth at night 5)  Doxazosin Mesylate 2 Mg Tabs (Doxazosin mesylate) .Marland Kitchen.. 1 by mouth daily  Patient Instructions: 1)  Call in 3 weeks for appt in Dec for Comp Exam, labs prior. Prescriptions: DOXAZOSIN MESYLATE 2 MG  TABS (DOXAZOSIN MESYLATE) 1 by mouth daily  #90 x 3   Entered and Authorized by:   Shaune Leeks MD  Signed by:   Shaune Leeks MD on 01/23/2010   Method used:   Electronically to        [facility]* (retail)             ,          Ph: [phone]       Fax: [phone]   RxID:   [id number] LEVOXYL 125 MCG  TABS (LEVOTHYROXINE SODIUM) 1daily by mouth  #90 x 3   Entered and Authorized by:   Shaune Leeks MD   Signed by:   Shaune Leeks MD on 01/23/2010   Method used:   Electronically to        [facility]* (retail)             ,          Ph: [phone]       Fax: [phone]   RxID:   [id number] RAMIPRIL 10 MG CAPS (RAMIPRIL) one tab by mouth once daily  #90 x 3   Entered and Authorized by:    Shaune Leeks MD   Signed by:   Shaune Leeks MD on 01/23/2010   Method used:   Electronically to        [facility]* (retail)             ,          Ph: [phone]       Fax: [phone]   RxID:   [id number] SIMVASTATIN 40 MG TABS (SIMVASTATIN) one tab by mouth at night  #90 x 3   Entered and Authorized by:   Shaune Leeks MD   Signed by:   Shaune Leeks MD on 01/23/2010   Method used:   Electronically to        [facility]* (retail)             ,          Ph: [phone]       Fax: [phone]   RxID:   [id number]   Current Allergies (reviewed today): ! SULFA

## 2010-09-05 NOTE — Letter (Signed)
Summary: [facility]: Immunoassay Fecal Occult Blood (iFOB) Order Form  [facility]  [address] [place], [place] [address]   Phone: [phone]  Fax: [phone]      [facility]: Immunoassay Fecal Occult Blood (iFOB) Order Form   June 21, 2010 MRN: [id number]   James [name] 09-30-1945   Physicican Name:_________________________  Diagnosis Code:__________________________      Shaune Leeks MD

## 2010-10-04 ENCOUNTER — Encounter: Payer: Self-pay | Admitting: Family Medicine

## 2010-10-04 ENCOUNTER — Ambulatory Visit (INDEPENDENT_AMBULATORY_CARE_PROVIDER_SITE_OTHER): Payer: Managed Care, Other (non HMO) | Admitting: Family Medicine

## 2010-10-04 DIAGNOSIS — I10 Essential (primary) hypertension: Secondary | ICD-10-CM

## 2010-10-12 NOTE — Assessment & Plan Note (Signed)
Summary: 3 MONTH FU BP   Vital Signs:  Patient profile:   65 year old male Weight:      257 pounds Temp:     97.0 degrees F oral Pulse rate:   64 / minute Pulse rhythm:   regular BP sitting:   122 / 64  (left arm) Cuff size:   large  Vitals Entered By: Sydell Axon LPN (October 04, 2010 8:03 AM) CC: 3 month follow-up on BP   History of Present Illness: Pt here for three month followup of BP with increase of Doxazosin which he has tolerated well w/o dizziness or other problem. He is urinating well and BPs pressures at home have been better altho not as good as here this AM. He feels well and  has no other complaints.  Problems Prior to Update: 1)  Special Screening Malig Neoplasms Other Sites  (ICD-V76.49) 2)  Screening For Malignant Neoplasm, Prostate  (ICD-V76.44) 3)  Degenerative Joint Disease, Cervical Spine  (ICD-721.90) 4)  Hypothyroidism Nos  (ICD-244.9) 5)  Hypertension, Benign Essential  (ICD-401.1) 6)  Hypercholesterolemia  (ICD-272.0) 7)  Health Maintenance Exam  (ICD-V70.0)  Medications Prior to Update: 1)  Ramipril 10 Mg Caps (Ramipril) .... One Tab By Mouth Once Daily 2)  Adult Aspirin Low Strength 81 Mg  Tbdp (Aspirin) .Marland Kitchen.. 1 Daily By Mouth 3)  Levoxyl 137 Mcg Tabs (Levothyroxine Sodium) .... Onbe Tab By Mouth Once Daily 4)  Simvastatin 40 Mg Tabs (Simvastatin) .... One Tab By Mouth At Night 5)  Doxazosin Mesylate 2 Mg  Tabs (Doxazosin Mesylate) .Marland Kitchen.. 1 By Mouth Daily 6)  Doxazosin Mesylate 4 Mg Tabs (Doxazosin Mesylate) .... One Tab By Mouth Once Daily  Current Medications (verified): 1)  Ramipril 10 Mg Caps (Ramipril) .... One Tab By Mouth Once Daily 2)  Adult Aspirin Low Strength 81 Mg  Tbdp (Aspirin) .Marland Kitchen.. 1 Daily By Mouth 3)  Levoxyl 137 Mcg Tabs (Levothyroxine Sodium) .... Onbe Tab By Mouth Once Daily 4)  Simvastatin 40 Mg Tabs (Simvastatin) .... One Tab By Mouth At Night 5)  Doxazosin Mesylate 4 Mg Tabs (Doxazosin Mesylate) .... One Tab By Mouth Once  Daily  Allergies: 1)  ! Sulfa  Physical Exam  General:  Well-developed,well-nourished,in no acute distress; alert,appropriate and cooperative throughout examination, slightly overweight. Head:  Normocephalic and atraumatic without obvious abnormalities. No apparent alopecia or balding. Sinuses NT. Eyes:  Conjunctiva clear bilaterally.  Ears:  External ear exam shows no significant lesions or deformities.  Otoscopic examination reveals clear canals, tympanic membranes are intact bilaterally without bulging, retraction, inflammation or discharge. Hearing is grossly normal bilaterally. Nose:  External nasal examination shows no deformity or inflammation. Nasal mucosa are pink and moist without lesions or exudates. Mouth:  Oral mucosa and oropharynx without lesions or exudates.  Teeth in good repair. Lungs:  Normal respiratory effort, chest expands symmetrically. Lungs are clear to auscultation, no crackles or wheezes. Heart:  Normal rate and regular rhythm. S1 and S2 normal without gallop, murmur, click, rub or other extra sounds.   Impression & Recommendations:  Problem # 1:  HYPERTENSION, BENIGN ESSENTIAL (ICD-401.1) Assessment Improved Good response. Cont curr meds. The following medications were removed from the medication list:    Doxazosin Mesylate 2 Mg Tabs (Doxazosin mesylate) .Marland Kitchen... 1 by mouth daily His updated medication list for this problem includes:    Ramipril 10 Mg Caps (Ramipril) ..... One tab by mouth once daily    Doxazosin Mesylate 4 Mg Tabs (Doxazosin mesylate) ..... One tab  by mouth once daily  BP today: 122/64     130/70 by me. Prior BP: 140/88 (07/05/2010)  Labs Reviewed: K+: 5.0 (06/16/2010) Creat: : 1.3 (06/16/2010)   Chol: 198 (06/16/2010)   HDL: 46.60 (06/16/2010)   LDL: 125 (06/16/2010)   TG: 134.0 (06/16/2010)  Complete Medication List: 1)  Ramipril 10 Mg Caps (Ramipril) .... One tab by mouth once daily 2)  Adult Aspirin Low Strength 81 Mg Tbdp (Aspirin)  .Marland Kitchen.. 1 daily by mouth 3)  Levoxyl 137 Mcg Tabs (Levothyroxine sodium) .... Onbe tab by mouth once daily 4)  Simvastatin 40 Mg Tabs (Simvastatin) .... One tab by mouth at night 5)  Doxazosin Mesylate 4 Mg Tabs (Doxazosin mesylate) .... One tab by mouth once daily  Patient Instructions: 1)  RTC as scheduled.   Orders Added: 1)  Est. Patient Level II [04540]    Current Allergies (reviewed today): ! SULFA

## 2010-11-18 ENCOUNTER — Encounter: Payer: Self-pay | Admitting: Family Medicine

## 2010-12-12 ENCOUNTER — Other Ambulatory Visit: Payer: Self-pay | Admitting: Family Medicine

## 2010-12-12 DIAGNOSIS — E039 Hypothyroidism, unspecified: Secondary | ICD-10-CM

## 2010-12-15 ENCOUNTER — Other Ambulatory Visit (INDEPENDENT_AMBULATORY_CARE_PROVIDER_SITE_OTHER): Payer: Managed Care, Other (non HMO) | Admitting: Family Medicine

## 2010-12-15 ENCOUNTER — Other Ambulatory Visit: Payer: Self-pay | Admitting: Family Medicine

## 2010-12-15 DIAGNOSIS — E039 Hypothyroidism, unspecified: Secondary | ICD-10-CM

## 2010-12-15 LAB — TSH: TSH: 1.6 u[IU]/mL (ref 0.35–5.50)

## 2010-12-15 LAB — T4, FREE: Free T4: 1.2 ng/dL (ref 0.60–1.60)

## 2010-12-20 ENCOUNTER — Encounter: Payer: Self-pay | Admitting: Family Medicine

## 2010-12-20 ENCOUNTER — Ambulatory Visit (INDEPENDENT_AMBULATORY_CARE_PROVIDER_SITE_OTHER): Payer: Managed Care, Other (non HMO) | Admitting: Family Medicine

## 2010-12-20 DIAGNOSIS — E039 Hypothyroidism, unspecified: Secondary | ICD-10-CM

## 2010-12-20 DIAGNOSIS — I10 Essential (primary) hypertension: Secondary | ICD-10-CM

## 2010-12-20 NOTE — Assessment & Plan Note (Signed)
Good control. Cont curr meds. BP Readings from Last 3 Encounters:  12/20/10 122/70  10/04/10 122/64  07/05/10 140/88

## 2010-12-20 NOTE — Patient Instructions (Signed)
RTC 6 mos for Comp Exam, labs prior. 

## 2010-12-20 NOTE — Progress Notes (Signed)
  Subjective:    Patient ID: KINSEY COWSERT, male    DOB: 06/08/1946, 65 y.o.   MRN: [id number]  HPI Pt here for repeated recheck of BP and recheck of readjusted thyroid replacement. He feels well and has no complaints. He was at the dermatologist's yesterday and had a basal cell cut off the right zygoma area. Tolerated fine.    Review of SystemsNoncontributory except as above.       Objective:   Physical Exam  Constitutional: He appears well-developed and well-nourished. No distress.  HENT:  Head: Normocephalic and atraumatic.  Right Ear: External ear normal.  Left Ear: External ear normal.  Nose: Nose normal.  Mouth/Throat: Oropharynx is clear and moist.  Eyes: Conjunctivae and EOM are normal. Pupils are equal, round, and reactive to light. Right eye exhibits no discharge. Left eye exhibits no discharge.  Neck: Normal range of motion. Neck supple.  Cardiovascular: Normal rate and regular rhythm.   Pulmonary/Chest: Effort normal and breath sounds normal. He has no wheezes.  Lymphadenopathy:    He has no cervical adenopathy.  Skin: He is not diaphoretic.       Dressing over right zygoma.          Assessment & Plan:

## 2010-12-20 NOTE — Assessment & Plan Note (Signed)
Euthyroid on curr dose of replacement. Cont.

## 2011-01-17 ENCOUNTER — Other Ambulatory Visit: Payer: Self-pay | Admitting: Family Medicine

## 2011-05-01 ENCOUNTER — Other Ambulatory Visit: Payer: Self-pay | Admitting: Family Medicine

## 2011-05-25 ENCOUNTER — Other Ambulatory Visit: Payer: Self-pay | Admitting: Family Medicine

## 2011-06-14 ENCOUNTER — Other Ambulatory Visit: Payer: Self-pay | Admitting: Family Medicine

## 2011-06-14 DIAGNOSIS — Z125 Encounter for screening for malignant neoplasm of prostate: Secondary | ICD-10-CM

## 2011-06-14 DIAGNOSIS — E039 Hypothyroidism, unspecified: Secondary | ICD-10-CM

## 2011-06-14 DIAGNOSIS — E7800 Pure hypercholesterolemia, unspecified: Secondary | ICD-10-CM

## 2011-06-14 DIAGNOSIS — M479 Spondylosis, unspecified: Secondary | ICD-10-CM

## 2011-06-14 DIAGNOSIS — I10 Essential (primary) hypertension: Secondary | ICD-10-CM

## 2011-06-20 ENCOUNTER — Other Ambulatory Visit (INDEPENDENT_AMBULATORY_CARE_PROVIDER_SITE_OTHER): Payer: Managed Care, Other (non HMO)

## 2011-06-20 DIAGNOSIS — I10 Essential (primary) hypertension: Secondary | ICD-10-CM

## 2011-06-20 DIAGNOSIS — M479 Spondylosis, unspecified: Secondary | ICD-10-CM

## 2011-06-20 DIAGNOSIS — E7800 Pure hypercholesterolemia, unspecified: Secondary | ICD-10-CM

## 2011-06-20 DIAGNOSIS — Z125 Encounter for screening for malignant neoplasm of prostate: Secondary | ICD-10-CM

## 2011-06-20 DIAGNOSIS — E039 Hypothyroidism, unspecified: Secondary | ICD-10-CM

## 2011-06-20 LAB — HEPATIC FUNCTION PANEL
AST: 14 U/L (ref 0–37)
Total Bilirubin: 1 mg/dL (ref 0.3–1.2)

## 2011-06-20 LAB — CBC WITH DIFFERENTIAL/PLATELET
Basophils Absolute: 0 10*3/uL (ref 0.0–0.1)
Eosinophils Absolute: 0.1 10*3/uL (ref 0.0–0.7)
Lymphocytes Relative: 39.8 % (ref 12.0–46.0)
MCHC: 33.9 g/dL (ref 30.0–36.0)
Monocytes Relative: 6.7 % (ref 3.0–12.0)
Platelets: 174 10*3/uL (ref 150.0–400.0)
RDW: 13.4 % (ref 11.5–14.6)

## 2011-06-20 LAB — LIPID PANEL
Cholesterol: 158 mg/dL (ref 0–200)
HDL: 43.9 mg/dL (ref >39.00)
LDL Cholesterol: 99 mg/dL (ref 0–99)
VLDL: 14.8 mg/dL (ref 0.0–40.0)

## 2011-06-20 LAB — BASIC METABOLIC PANEL
CO2: 28 mEq/L (ref 19–32)
Calcium: 9.4 mg/dL (ref 8.4–10.5)
Chloride: 105 mEq/L (ref 96–112)
Sodium: 140 mEq/L (ref 135–145)

## 2011-06-20 LAB — TSH: TSH: 2.26 u[IU]/mL (ref 0.35–5.50)

## 2011-06-22 LAB — T4, FREE: Free T4: 1.05 ng/dL (ref 0.60–1.60)

## 2011-06-23 ENCOUNTER — Other Ambulatory Visit: Payer: Self-pay | Admitting: Family Medicine

## 2011-06-27 ENCOUNTER — Ambulatory Visit (INDEPENDENT_AMBULATORY_CARE_PROVIDER_SITE_OTHER): Payer: Managed Care, Other (non HMO) | Admitting: Family Medicine

## 2011-06-27 ENCOUNTER — Encounter: Payer: Self-pay | Admitting: Family Medicine

## 2011-06-27 DIAGNOSIS — I10 Essential (primary) hypertension: Secondary | ICD-10-CM

## 2011-06-27 DIAGNOSIS — E039 Hypothyroidism, unspecified: Secondary | ICD-10-CM

## 2011-06-27 DIAGNOSIS — K644 Residual hemorrhoidal skin tags: Secondary | ICD-10-CM | POA: Insufficient documentation

## 2011-06-27 DIAGNOSIS — E7800 Pure hypercholesterolemia, unspecified: Secondary | ICD-10-CM

## 2011-06-27 MED ORDER — HYDROCORTISONE 2.5 % RE CREA: TOPICAL_CREAM | RECTAL | Status: DC

## 2011-06-27 MED ORDER — METOPROLOL SUCCINATE ER 25 MG PO TB24: 25.0000 mg | ORAL_TABLET | Freq: Every day | ORAL | Status: DC

## 2011-06-27 NOTE — Assessment & Plan Note (Signed)
Trial of Anusol cream and sitz bathing as discussed.

## 2011-06-27 NOTE — Assessment & Plan Note (Signed)
Still mildly elevated. Cont Doxazosin and Ramipril and add small dose of Metoprolol at night. Script sent in. Recheck in three months. BP Readings from Last 3 Encounters:  06/27/11 146/70  12/20/10 122/70  10/04/10 122/64

## 2011-06-27 NOTE — Patient Instructions (Signed)
RTC 3 mos to estab with Dr Para March and recheck BP

## 2011-06-27 NOTE — Assessment & Plan Note (Signed)
Great nos. Cont Simva current dose. Lab Results  Component Value Date   CHOL 158 06/20/2011   HDL 43.90 06/20/2011   LDLCALC 99 06/20/2011   TRIG 74.0 06/20/2011   CHOLHDL 4 06/20/2011

## 2011-06-27 NOTE — Assessment & Plan Note (Signed)
Well controlled and currently euthyroid. Cont curr dose.

## 2011-06-27 NOTE — Progress Notes (Signed)
  Subjective:    Patient ID: James [name], male    DOB: 08-Aug-1945, 65 y.o.   MRN: [id number]  HPI Pt here for Comp Exam. He feels and has no complaints except for irregular bowel movements. He has had some hemm problems.     Review of Systems  Constitutional: Negative for fever, chills, diaphoresis, appetite change, fatigue and unexpected weight change.  HENT: Positive for tinnitus (chronic). Negative for hearing loss, ear pain and ear discharge.   Eyes: Negative for pain, discharge and visual disturbance.  Respiratory: Negative for cough, shortness of breath and wheezing.   Cardiovascular: Negative for chest pain and palpitations.       No SOB w/ exertion  Gastrointestinal: Positive for constipation (see HPI). Negative for nausea, vomiting, abdominal pain, diarrhea and blood in stool.       No heartburn or swallowing problems.  Genitourinary: Negative for dysuria, frequency and difficulty urinating.       No nocturia  Musculoskeletal: Positive for arthralgias (generally.). Negative for myalgias and back pain.  Skin: Negative for rash.       No itching or dryness.  Neurological: Positive for light-headedness (thought to have mild labyrinthitis at times.). Negative for tremors and numbness.       No tingling or balance problems.  Hematological: Negative for adenopathy. Does not bruise/bleed easily.  Psychiatric/Behavioral: Negative for dysphoric mood and agitation.       Objective:   Physical Exam  Constitutional: He is oriented to person, place, and time. He appears well-developed and well-nourished. No distress.  HENT:  Head: Normocephalic and atraumatic.  Right Ear: External ear normal.  Left Ear: External ear normal.  Nose: Nose normal.  Mouth/Throat: Oropharynx is clear and moist.  Eyes: Conjunctivae and EOM are normal. Pupils are equal, round, and reactive to light. Right eye exhibits no discharge. Left eye exhibits no discharge. No scleral icterus.  Neck: Normal range  of motion. Neck supple. No thyromegaly present.  Cardiovascular: Normal rate, regular rhythm, normal heart sounds and intact distal pulses.   No murmur heard. Pulmonary/Chest: Effort normal and breath sounds normal. No respiratory distress. He has no wheezes.  Abdominal: Soft. Bowel sounds are normal. He exhibits no distension and no mass. There is no tenderness. There is no rebound and no guarding.  Genitourinary: Rectum normal, prostate normal and penis normal. Guaiac negative stool.  Musculoskeletal: Normal range of motion. He exhibits no edema.  Lymphadenopathy:    He has no cervical adenopathy.  Neurological: He is alert and oriented to person, place, and time. Coordination normal.  Skin: Skin is warm and dry. No rash noted. He is not diaphoretic.  Psychiatric: He has a normal mood and affect. His behavior is normal. Judgment and thought content normal.          Assessment & Plan:  HMPE

## 2011-07-30 ENCOUNTER — Other Ambulatory Visit: Payer: Self-pay | Admitting: Family Medicine

## 2011-08-07 HISTORY — PX: MOHS SURGERY: SUR867

## 2011-08-14 ENCOUNTER — Other Ambulatory Visit: Payer: Self-pay | Admitting: Family Medicine

## 2011-08-14 ENCOUNTER — Other Ambulatory Visit: Payer: Managed Care, Other (non HMO)

## 2011-08-14 DIAGNOSIS — Z0000 Encounter for general adult medical examination without abnormal findings: Secondary | ICD-10-CM

## 2011-08-14 LAB — FECAL OCCULT BLOOD, IMMUNOCHEMICAL: Fecal Occult Bld: NEGATIVE

## 2011-08-19 ENCOUNTER — Other Ambulatory Visit: Payer: Self-pay | Admitting: Family Medicine

## 2011-09-10 ENCOUNTER — Other Ambulatory Visit: Payer: Self-pay | Admitting: *Deleted

## 2011-09-10 MED ORDER — SIMVASTATIN 40 MG PO TABS: 40.0000 mg | ORAL_TABLET | Freq: Every day | ORAL | Status: DC

## 2011-09-10 MED ORDER — RAMIPRIL 10 MG PO CAPS: 10.0000 mg | ORAL_CAPSULE | Freq: Every day | ORAL | Status: DC

## 2011-09-21 ENCOUNTER — Other Ambulatory Visit: Payer: Self-pay | Admitting: Family Medicine

## 2011-09-27 ENCOUNTER — Encounter: Payer: Self-pay | Admitting: Family Medicine

## 2011-09-27 ENCOUNTER — Ambulatory Visit (INDEPENDENT_AMBULATORY_CARE_PROVIDER_SITE_OTHER): Payer: Managed Care, Other (non HMO) | Admitting: Family Medicine

## 2011-09-27 DIAGNOSIS — I10 Essential (primary) hypertension: Secondary | ICD-10-CM

## 2011-09-27 NOTE — Patient Instructions (Addendum)
When you are 23, we should talk about getting the pneumonia vaccine and screening for AAA.  I would get a flu shot each fall. We may not need to check a PSA this fall.   Physical this fall.

## 2011-09-27 NOTE — Progress Notes (Signed)
Hypertension:    Using medication without problems or lightheadedness: yes Chest pain with exertion:no Edema:no Short of breath:no Average home BPs: occ checked but his cuff wasn't reliable.   Exercising: walking and playing tennis.  Labs d/w pt.   Meds, vitals, and allergies reviewed.   PMH and SH reviewed  ROS: See HPI.  Otherwise negative.    GEN: nad, alert and oriented HEENT: mucous membranes moist NECK: supple w/o LA CV: rrr. PULM: ctab, no inc wob ABD: soft, +bs EXT: no edema SKIN: no acute rash

## 2011-09-30 NOTE — Assessment & Plan Note (Signed)
Improved, no change in meds.  Labs d/w pt.  F/u this fall.

## 2011-10-01 NOTE — Telephone Encounter (Signed)
Received refill request from [facility]. Medication was refilled with [facility] on 09/10/11. Left message for patient to call back and verify pharmacy.

## 2012-04-08 ENCOUNTER — Other Ambulatory Visit: Payer: Self-pay | Admitting: *Deleted

## 2012-04-08 MED ORDER — DOXAZOSIN MESYLATE 4 MG PO TABS: 4.0000 mg | ORAL_TABLET | Freq: Every day | ORAL | Status: DC

## 2012-04-24 ENCOUNTER — Ambulatory Visit (INDEPENDENT_AMBULATORY_CARE_PROVIDER_SITE_OTHER): Payer: Managed Care, Other (non HMO) | Admitting: Family Medicine

## 2012-04-24 ENCOUNTER — Encounter: Payer: Self-pay | Admitting: Family Medicine

## 2012-04-24 ENCOUNTER — Telehealth: Payer: Self-pay | Admitting: Family Medicine

## 2012-04-24 VITALS — BP 150/84 | HR 59 | Temp 98.6°F | Wt 253.0 lb

## 2012-04-24 DIAGNOSIS — K644 Residual hemorrhoidal skin tags: Secondary | ICD-10-CM

## 2012-04-24 MED ORDER — METOPROLOL SUCCINATE ER 25 MG PO TB24: 25.0000 mg | ORAL_TABLET | Freq: Every day | ORAL | Status: DC

## 2012-04-24 MED ORDER — LEVOTHYROXINE SODIUM 137 MCG PO TABS: ORAL_TABLET | ORAL | Status: DC

## 2012-04-24 MED ORDER — HYDROCORTISONE ACETATE 25 MG RE SUPP: 25.0000 mg | Freq: Two times a day (BID) | RECTAL | Status: DC

## 2012-04-24 MED ORDER — DOCUSATE SODIUM 100 MG PO CAPS: 100.0000 mg | ORAL_CAPSULE | Freq: Every day | ORAL | Status: DC | PRN

## 2012-04-24 NOTE — Telephone Encounter (Signed)
Caller: James [name]/Patient; Patient Name: James [name]; PCP: Crawford Givens Clelia Croft) [facility]); Best Callback Phone Number: [phone]. Caller reports he has not had a good BM in about two weeks due to some hemmorhoids. He has been treating them with minimal amounts of stool produced. No bleeding. Pressure in lower abdomen and feeling urge to have stool. Per Constipation Protocol, No bowel movement for one week and unresponsive to home care, See MD in 24 hours. Appointment scheduled for today, Thursday 9/19 at 12:15 with Dr Para March. Caller is agreeable.

## 2012-04-24 NOTE — Telephone Encounter (Signed)
Seen today. 

## 2012-04-24 NOTE — Patient Instructions (Addendum)
Use the suppositories and take colace 100mg  a day.  If not improved, then notify us.  Take care.

## 2012-04-24 NOTE — Progress Notes (Signed)
Constipation/altered BMs over the last few weeks.  H/o hemorrhoids that are puffy and tender.  He has urge to defecate.  He's trying not to strain with BM.  Still passing gas.  No clear trigger for constipation.  No abd pain but he feels slightly bloated.  Last BM was 2 days ago.  No blood in stool.  He's had occ soft stools, no very hard stools.    Fiber in diet- taking Citrucel, this is not new.  He usually does well with that.   Needed refills on BB and levoxyl.  Sent.  Has routine f/u pending for later in the year.   No FH colon cancer.  Prev with neg IFOBs.    Meds, vitals, and allergies reviewed.   ROS: See HPI.  Otherwise, noncontributory.  nad ncat rrr ctab Abd soft, not ttp, +BS.   Ext hemorrhoid noted, not thrombosed.

## 2012-04-25 NOTE — Assessment & Plan Note (Signed)
Also possibly with int hemorrhoids.  Will change to suppository tx, add on colace and then f/u prn.  He agrees.  Exam reassuring with abd not ttp and normal BS.

## 2012-05-26 ENCOUNTER — Other Ambulatory Visit: Payer: Self-pay | Admitting: *Deleted

## 2012-05-26 NOTE — Telephone Encounter (Signed)
Faxed refill request   

## 2012-05-27 MED ORDER — LEVOTHYROXINE SODIUM 137 MCG PO TABS: ORAL_TABLET | ORAL | Status: DC

## 2012-05-27 NOTE — Telephone Encounter (Signed)
Sent!

## 2012-06-09 ENCOUNTER — Other Ambulatory Visit: Payer: Self-pay | Admitting: Family Medicine

## 2012-06-09 DIAGNOSIS — I10 Essential (primary) hypertension: Secondary | ICD-10-CM

## 2012-06-09 DIAGNOSIS — Z125 Encounter for screening for malignant neoplasm of prostate: Secondary | ICD-10-CM

## 2012-06-09 DIAGNOSIS — E039 Hypothyroidism, unspecified: Secondary | ICD-10-CM

## 2012-06-18 ENCOUNTER — Other Ambulatory Visit (INDEPENDENT_AMBULATORY_CARE_PROVIDER_SITE_OTHER): Payer: Managed Care, Other (non HMO)

## 2012-06-18 DIAGNOSIS — E039 Hypothyroidism, unspecified: Secondary | ICD-10-CM

## 2012-06-18 DIAGNOSIS — I10 Essential (primary) hypertension: Secondary | ICD-10-CM

## 2012-06-18 DIAGNOSIS — Z125 Encounter for screening for malignant neoplasm of prostate: Secondary | ICD-10-CM

## 2012-06-18 LAB — COMPREHENSIVE METABOLIC PANEL
BUN: 16 mg/dL (ref 6–23)
CO2: 28 mEq/L (ref 19–32)
Calcium: 9.3 mg/dL (ref 8.4–10.5)
Chloride: 106 mEq/L (ref 96–112)
Creatinine, Ser: 1.3 mg/dL (ref 0.4–1.5)
GFR: 57.87 mL/min — ABNORMAL LOW (ref >60.00)
Glucose, Bld: 85 mg/dL (ref 70–99)
Total Bilirubin: 0.8 mg/dL (ref 0.3–1.2)

## 2012-06-18 LAB — LIPID PANEL
Cholesterol: 169 mg/dL (ref 0–200)
HDL: 46.6 mg/dL (ref >39.00)
Triglycerides: 113 mg/dL (ref 0.0–149.0)

## 2012-06-18 LAB — TSH: TSH: 4.44 u[IU]/mL (ref 0.35–5.50)

## 2012-06-19 ENCOUNTER — Other Ambulatory Visit: Payer: Managed Care, Other (non HMO)

## 2012-06-30 ENCOUNTER — Encounter: Payer: Self-pay | Admitting: Family Medicine

## 2012-06-30 ENCOUNTER — Ambulatory Visit (INDEPENDENT_AMBULATORY_CARE_PROVIDER_SITE_OTHER): Payer: Managed Care, Other (non HMO) | Admitting: Family Medicine

## 2012-06-30 VITALS — BP 130/78 | HR 54 | Temp 97.8°F | Ht 72.0 in | Wt 255.8 lb

## 2012-06-30 DIAGNOSIS — Z0000 Encounter for general adult medical examination without abnormal findings: Secondary | ICD-10-CM

## 2012-06-30 DIAGNOSIS — I10 Essential (primary) hypertension: Secondary | ICD-10-CM

## 2012-06-30 DIAGNOSIS — Z1211 Encounter for screening for malignant neoplasm of colon: Secondary | ICD-10-CM

## 2012-06-30 DIAGNOSIS — E7800 Pure hypercholesterolemia, unspecified: Secondary | ICD-10-CM

## 2012-06-30 DIAGNOSIS — E039 Hypothyroidism, unspecified: Secondary | ICD-10-CM

## 2012-06-30 NOTE — Progress Notes (Signed)
CPE- See plan.  Routine anticipatory guidance given to patient.  See health maintenance. Tetanus 2009 Shingles 2009 PNA due at 58.   Flu shot done prev at pharmacy.   Living will.  Wife would be designated.  He's talked with wife prev.   D/w patient [id number] for colon cancer screening, including IFOB vs. colonoscopy.  Risks and benefits of both were discussed and patient voiced understanding.  Pt elects WUJ:[id number].   PSA wnl.   Diet d/w pt.   Exercise d/w pt.  Tennis a few times a week.  Has a silver sneakers card.    Hypothyroid.  No ant neck pain. No neck mass. Compliant with meds.  TSH.   Hypertension:    Using medication without problems or lightheadedness: yes Chest pain with exertion:no Edema:no Short of breath:no  Elevated Cholesterol: Using medications without problems: yes Muscle aches: no Diet compliance:yes Exercise:yes  Hemorrhoids are improved from prev.     He has sleep walked once since last OV.  He had gotten up to go to the bathroom and his wife saw him.  He urinated in the bathroom and then went back to bed.  No other events.  He was very fatigued that night and this may have played a role in the event.    PMH and SH reviewed  Meds, vitals, and allergies reviewed.   ROS: See HPI.  Otherwise negative.    GEN: nad, alert and oriented HEENT: mucous membranes moist, TM wnl, pinna w/o x2 w/o abnormality seen, OP wnl NECK: supple w/o LA, no tmg CV: rrr. PULM: ctab, no inc wob ABD: soft, +bs EXT: no edema SKIN: no acute rash

## 2012-06-30 NOTE — Patient Instructions (Addendum)
Go to the lab on the way out to get the stool cards.  We'll contact you with your lab report. Take care.  Glad to see you.  Don't change your meds.

## 2012-07-01 DIAGNOSIS — Z0000 Encounter for general adult medical examination without abnormal findings: Secondary | ICD-10-CM | POA: Insufficient documentation

## 2012-07-01 NOTE — Assessment & Plan Note (Signed)
Controlled, continue as is.  Labs d/w pt.  

## 2012-07-01 NOTE — Assessment & Plan Note (Signed)
Routine anticipatory guidance given to patient.  See health maintenance. Tetanus 2009 Shingles 2009 PNA due at 56.   Flu shot done prev at pharmacy.   Living will.  Wife would be designated.  He's talked with wife prev.   D/w patient AV:[id number] for colon cancer screening, including IFOB vs. colonoscopy.  Risks and benefits of both were discussed and patient voiced understanding.  Pt elects JYN:WGNF.   PSA wnl.   Diet d/w pt.   Exercise d/w pt.  Tennis a few times a week.  Has a silver sneakers card.

## 2012-07-01 NOTE — Assessment & Plan Note (Signed)
With tsh trending up but still normal and no TMG.  Continue as is for now, recheck yearly, sooner prn.

## 2012-07-04 ENCOUNTER — Other Ambulatory Visit (INDEPENDENT_AMBULATORY_CARE_PROVIDER_SITE_OTHER): Payer: Managed Care, Other (non HMO)

## 2012-07-04 DIAGNOSIS — Z1211 Encounter for screening for malignant neoplasm of colon: Secondary | ICD-10-CM

## 2012-07-04 LAB — FECAL OCCULT BLOOD, IMMUNOCHEMICAL: Fecal Occult Bld: NEGATIVE

## 2012-07-07 ENCOUNTER — Encounter: Payer: Self-pay | Admitting: *Deleted

## 2012-09-11 ENCOUNTER — Other Ambulatory Visit: Payer: Self-pay | Admitting: *Deleted

## 2012-09-11 MED ORDER — RAMIPRIL 10 MG PO CAPS: 10.0000 mg | ORAL_CAPSULE | Freq: Every day | ORAL | Status: DC

## 2012-09-11 MED ORDER — SIMVASTATIN 40 MG PO TABS: 40.0000 mg | ORAL_TABLET | Freq: Every day | ORAL | Status: DC

## 2013-01-05 ENCOUNTER — Other Ambulatory Visit: Payer: Self-pay | Admitting: *Deleted

## 2013-01-05 MED ORDER — RAMIPRIL 10 MG PO CAPS: 10.0000 mg | ORAL_CAPSULE | Freq: Every day | ORAL | Status: DC

## 2013-02-18 ENCOUNTER — Other Ambulatory Visit: Payer: Self-pay | Admitting: *Deleted

## 2013-02-18 MED ORDER — METOPROLOL SUCCINATE ER 25 MG PO TB24: 25.0000 mg | ORAL_TABLET | Freq: Every day | ORAL | Status: DC

## 2013-02-18 MED ORDER — DOXAZOSIN MESYLATE 4 MG PO TABS: 4.0000 mg | ORAL_TABLET | Freq: Every day | ORAL | Status: DC

## 2013-03-03 ENCOUNTER — Other Ambulatory Visit: Payer: Self-pay | Admitting: *Deleted

## 2013-03-03 MED ORDER — SIMVASTATIN 40 MG PO TABS: 40.0000 mg | ORAL_TABLET | Freq: Every day | ORAL | Status: DC

## 2013-03-31 ENCOUNTER — Other Ambulatory Visit: Payer: Self-pay

## 2013-03-31 MED ORDER — LEVOTHYROXINE SODIUM 137 MCG PO TABS: ORAL_TABLET | ORAL | Status: DC

## 2013-03-31 NOTE — Telephone Encounter (Signed)
Pt left v/m; pt is out of levothyroxine 137 mcg; pt has ordered from mail order pharmacy but request # [phone] sent to [facility].advised pt done.

## 2013-05-21 ENCOUNTER — Telehealth: Payer: Self-pay | Admitting: Family Medicine

## 2013-05-21 NOTE — Telephone Encounter (Signed)
I'll work on the hard copy.  

## 2013-05-21 NOTE — Telephone Encounter (Signed)
Pt dropped off a medical clearance form for [facility] for Dr. Para March to sign. I have placed the form in your inbox. Thank you.

## 2013-07-03 ENCOUNTER — Encounter: Payer: Managed Care, Other (non HMO) | Admitting: Family Medicine

## 2013-07-05 ENCOUNTER — Other Ambulatory Visit: Payer: Self-pay | Admitting: Family Medicine

## 2013-07-05 DIAGNOSIS — E7800 Pure hypercholesterolemia, unspecified: Secondary | ICD-10-CM

## 2013-07-05 DIAGNOSIS — E039 Hypothyroidism, unspecified: Secondary | ICD-10-CM

## 2013-07-08 ENCOUNTER — Other Ambulatory Visit: Payer: Self-pay | Admitting: *Deleted

## 2013-07-08 MED ORDER — LEVOTHYROXINE SODIUM 137 MCG PO TABS: ORAL_TABLET | ORAL | Status: DC

## 2013-07-09 ENCOUNTER — Other Ambulatory Visit (INDEPENDENT_AMBULATORY_CARE_PROVIDER_SITE_OTHER): Payer: Medicare Other

## 2013-07-09 DIAGNOSIS — E039 Hypothyroidism, unspecified: Secondary | ICD-10-CM

## 2013-07-09 DIAGNOSIS — I10 Essential (primary) hypertension: Secondary | ICD-10-CM

## 2013-07-09 DIAGNOSIS — E7800 Pure hypercholesterolemia, unspecified: Secondary | ICD-10-CM

## 2013-07-09 LAB — LIPID PANEL
Cholesterol: 161 mg/dL (ref 0–200)
HDL: 40.6 mg/dL (ref >39.00)
Triglycerides: 111 mg/dL (ref 0.0–149.0)
VLDL: 22.2 mg/dL (ref 0.0–40.0)

## 2013-07-09 LAB — COMPREHENSIVE METABOLIC PANEL
ALT: 15 U/L (ref 0–53)
Alkaline Phosphatase: 45 U/L (ref 39–117)
BUN: 17 mg/dL (ref 6–23)
CO2: 31 mEq/L (ref 19–32)
Chloride: 107 mEq/L (ref 96–112)
Creatinine, Ser: 1.2 mg/dL (ref 0.4–1.5)
GFR: 66.96 mL/min (ref >60.00)
Glucose, Bld: 88 mg/dL (ref 70–99)
Total Bilirubin: 1 mg/dL (ref 0.3–1.2)

## 2013-07-16 ENCOUNTER — Ambulatory Visit (INDEPENDENT_AMBULATORY_CARE_PROVIDER_SITE_OTHER): Payer: Medicare Other | Admitting: Family Medicine

## 2013-07-16 ENCOUNTER — Encounter: Payer: Self-pay | Admitting: Family Medicine

## 2013-07-16 VITALS — BP 142/78 | HR 74 | Temp 97.7°F | Ht 73.0 in | Wt 253.8 lb

## 2013-07-16 DIAGNOSIS — E039 Hypothyroidism, unspecified: Secondary | ICD-10-CM

## 2013-07-16 DIAGNOSIS — Z0000 Encounter for general adult medical examination without abnormal findings: Secondary | ICD-10-CM

## 2013-07-16 DIAGNOSIS — Z23 Encounter for immunization: Secondary | ICD-10-CM

## 2013-07-16 DIAGNOSIS — K644 Residual hemorrhoidal skin tags: Secondary | ICD-10-CM

## 2013-07-16 DIAGNOSIS — Z1211 Encounter for screening for malignant neoplasm of colon: Secondary | ICD-10-CM

## 2013-07-16 DIAGNOSIS — I10 Essential (primary) hypertension: Secondary | ICD-10-CM

## 2013-07-16 DIAGNOSIS — E7800 Pure hypercholesterolemia, unspecified: Secondary | ICD-10-CM

## 2013-07-16 MED ORDER — DOXAZOSIN MESYLATE 4 MG PO TABS: 4.0000 mg | ORAL_TABLET | Freq: Every day | ORAL | Status: DC

## 2013-07-16 MED ORDER — RAMIPRIL 10 MG PO CAPS: 10.0000 mg | ORAL_CAPSULE | Freq: Every day | ORAL | Status: DC

## 2013-07-16 MED ORDER — METOPROLOL SUCCINATE ER 25 MG PO TB24: 25.0000 mg | ORAL_TABLET | Freq: Every day | ORAL | Status: DC

## 2013-07-16 MED ORDER — HYDROCORTISONE 2.5 % RE CREA: 1.0000 "application " | TOPICAL_CREAM | Freq: Two times a day (BID) | RECTAL | Status: DC

## 2013-07-16 MED ORDER — SIMVASTATIN 40 MG PO TABS: 40.0000 mg | ORAL_TABLET | Freq: Every day | ORAL | Status: DC

## 2013-07-16 NOTE — Assessment & Plan Note (Signed)
Controlled, continue current meds. Labs d/w pt.  

## 2013-07-16 NOTE — Progress Notes (Signed)
Pre-visit discussion using our clinic review tool. No additional management support is needed unless otherwise documented below in the visit note.  I have personally reviewed the Medicare Annual Wellness questionnaire and have noted 1. The patient's medical and social history 2. Their use of alcohol, tobacco or illicit drugs 3. Their current medications and supplements 4. The patient's functional ability including ADL's, fall risks, home safety risks and hearing or visual             impairment. 5. Diet and physical activities 6. Evidence for depression or mood disorders  The patients weight, height, BMI have been recorded in the chart and visual acuity is per eye clinic.  I have made referrals, counseling and provided education to the patient based review of the above and I have provided the pt with a written personalized care plan for preventive services.  See scanned forms.  Routine anticipatory guidance given to patient.  See health maintenance. Flu 04/23/13 Shingles 2009 PNA today Tetanus 2009 D/w patient [id number] for colon cancer screening, including IFOB vs. colonoscopy.  Risks and benefits of both were discussed and patient voiced understanding.  Pt elects [id number].  Prostate cancer screening and PSA options (with potential risks and benefits of testing vs not testing) were discussed along with recent recs/guidelines.  He declined testing PSA at this point. Advance directive.  D/w pt.  Encouraged.  Wife would be designated if incapacitated.  Cognitive function addressed- see scanned forms- and if abnormal then additional documentation follows.   Hypertension:    Using medication without problems or lightheadedness: yes Chest pain with exertion:no Edema:no Short of breath:no  Elevated Cholesterol: Using medications without problems:yes Muscle aches: no Diet compliance:yes Exercise:yes  Hypothyroid, no lump in neck no dysphagia. No sx of under/over replacement.   PMH and  SH reviewed  Meds, vitals, and allergies reviewed.   ROS: See HPI.  Otherwise negative.    GEN: nad, alert and oriented HEENT: mucous membranes moist NECK: supple w/o LA, no TMG CV: rrr. PULM: ctab, no inc wob ABD: soft, +bs EXT: no edema SKIN: no acute rash

## 2013-07-16 NOTE — Assessment & Plan Note (Signed)
See scanned forms.  Routine anticipatory guidance given to patient.  See health maintenance. Flu 04/23/13 Shingles 2009 PNA today Tetanus 2009 D/w patient [id number] for colon cancer screening, including IFOB vs. colonoscopy.  Risks and benefits of both were discussed and patient voiced understanding.  Pt elects WUJ:[id number].  Prostate cancer screening and PSA options (with potential risks and benefits of testing vs not testing) were discussed along with recent recs/guidelines.  He declined testing PSA at this point. Advance directive.  D/w pt.  Encouraged.  Wife would be designated if incapacitated.  Cognitive function addressed- see scanned forms- and if abnormal then additional documentation follows.

## 2013-07-16 NOTE — Assessment & Plan Note (Signed)
Controlled, continue current meds. Labs d/w pt.  Continue diet and exercise.

## 2013-07-16 NOTE — Assessment & Plan Note (Signed)
Reasonably controlled, continue current meds. Labs d/w pt.  Continue diet and exercise.

## 2013-07-16 NOTE — Assessment & Plan Note (Signed)
Controlled, continue current meds to use prn.  He agrees.

## 2013-07-16 NOTE — Patient Instructions (Signed)
Take care.  Glad to see you.  Don't change your meds.  Go to the lab on the way out.  We'll contact you with your lab report.

## 2013-07-28 ENCOUNTER — Other Ambulatory Visit (INDEPENDENT_AMBULATORY_CARE_PROVIDER_SITE_OTHER): Payer: Medicare Other

## 2013-07-28 ENCOUNTER — Encounter: Payer: Self-pay | Admitting: *Deleted

## 2013-07-28 DIAGNOSIS — Z1211 Encounter for screening for malignant neoplasm of colon: Secondary | ICD-10-CM

## 2013-07-28 LAB — FECAL OCCULT BLOOD, IMMUNOCHEMICAL: Fecal Occult Bld: NEGATIVE

## 2013-10-16 ENCOUNTER — Other Ambulatory Visit: Payer: Self-pay | Admitting: *Deleted

## 2013-10-16 MED ORDER — LEVOTHYROXINE SODIUM 137 MCG PO TABS: ORAL_TABLET | ORAL | Status: DC

## 2014-03-01 ENCOUNTER — Other Ambulatory Visit: Payer: Self-pay | Admitting: *Deleted

## 2014-03-01 MED ORDER — LEVOTHYROXINE SODIUM 137 MCG PO TABS: ORAL_TABLET | ORAL | Status: DC

## 2014-07-07 ENCOUNTER — Other Ambulatory Visit: Payer: Self-pay | Admitting: Family Medicine

## 2014-07-07 DIAGNOSIS — I10 Essential (primary) hypertension: Secondary | ICD-10-CM

## 2014-07-13 ENCOUNTER — Other Ambulatory Visit (INDEPENDENT_AMBULATORY_CARE_PROVIDER_SITE_OTHER): Payer: Medicare Other

## 2014-07-13 DIAGNOSIS — I10 Essential (primary) hypertension: Secondary | ICD-10-CM

## 2014-07-13 LAB — TSH: TSH: 4.09 u[IU]/mL (ref 0.35–4.50)

## 2014-07-13 LAB — COMPREHENSIVE METABOLIC PANEL
ALT: 9 U/L (ref 0–53)
AST: 14 U/L (ref 0–37)
Albumin: 3.7 g/dL (ref 3.5–5.2)
Alkaline Phosphatase: 53 U/L (ref 39–117)
BUN: 18 mg/dL (ref 6–23)
CALCIUM: 9.3 mg/dL (ref 8.4–10.5)
CHLORIDE: 106 meq/L (ref 96–112)
CO2: 26 meq/L (ref 19–32)
Creatinine, Ser: 1.3 mg/dL (ref 0.4–1.5)
GFR: 59.05 mL/min — AB (ref >60.00)
Glucose, Bld: 89 mg/dL (ref 70–99)
Potassium: 4.4 mEq/L (ref 3.5–5.1)
Sodium: 139 mEq/L (ref 135–145)
Total Bilirubin: 1.2 mg/dL (ref 0.2–1.2)
Total Protein: 6.3 g/dL (ref 6.0–8.3)

## 2014-07-13 LAB — LIPID PANEL
Cholesterol: 163 mg/dL (ref 0–200)
HDL: 32.8 mg/dL — ABNORMAL LOW (ref >39.00)
LDL Cholesterol: 103 mg/dL — ABNORMAL HIGH (ref 0–99)
NonHDL: 130.2
TRIGLYCERIDES: 138 mg/dL (ref 0.0–149.0)
Total CHOL/HDL Ratio: 5
VLDL: 27.6 mg/dL (ref 0.0–40.0)

## 2014-07-20 ENCOUNTER — Ambulatory Visit (INDEPENDENT_AMBULATORY_CARE_PROVIDER_SITE_OTHER): Payer: Medicare Other | Admitting: Family Medicine

## 2014-07-20 ENCOUNTER — Encounter: Payer: Self-pay | Admitting: Family Medicine

## 2014-07-20 VITALS — BP 152/86 | HR 62 | Temp 98.4°F | Ht 72.0 in | Wt 259.0 lb

## 2014-07-20 DIAGNOSIS — Z0000 Encounter for general adult medical examination without abnormal findings: Secondary | ICD-10-CM

## 2014-07-20 DIAGNOSIS — J111 Influenza due to unidentified influenza virus with other respiratory manifestations: Secondary | ICD-10-CM

## 2014-07-20 DIAGNOSIS — J101 Influenza due to other identified influenza virus with other respiratory manifestations: Secondary | ICD-10-CM

## 2014-07-20 DIAGNOSIS — Z7189 Other specified counseling: Secondary | ICD-10-CM

## 2014-07-20 DIAGNOSIS — E7800 Pure hypercholesterolemia, unspecified: Secondary | ICD-10-CM

## 2014-07-20 DIAGNOSIS — Z1211 Encounter for screening for malignant neoplasm of colon: Secondary | ICD-10-CM

## 2014-07-20 DIAGNOSIS — I10 Essential (primary) hypertension: Secondary | ICD-10-CM

## 2014-07-20 DIAGNOSIS — Z23 Encounter for immunization: Secondary | ICD-10-CM

## 2014-07-20 DIAGNOSIS — Z136 Encounter for screening for cardiovascular disorders: Secondary | ICD-10-CM

## 2014-07-20 DIAGNOSIS — E039 Hypothyroidism, unspecified: Secondary | ICD-10-CM

## 2014-07-20 MED ORDER — DOXAZOSIN MESYLATE 4 MG PO TABS: 4.0000 mg | ORAL_TABLET | Freq: Every day | ORAL | Status: DC

## 2014-07-20 MED ORDER — METOPROLOL SUCCINATE ER 25 MG PO TB24: 25.0000 mg | ORAL_TABLET | Freq: Every day | ORAL | Status: DC

## 2014-07-20 MED ORDER — LEVOTHYROXINE SODIUM 137 MCG PO TABS: ORAL_TABLET | ORAL | Status: DC

## 2014-07-20 MED ORDER — SIMVASTATIN 40 MG PO TABS: 40.0000 mg | ORAL_TABLET | Freq: Every day | ORAL | Status: DC

## 2014-07-20 MED ORDER — RAMIPRIL 10 MG PO CAPS: 10.0000 mg | ORAL_CAPSULE | Freq: Every day | ORAL | Status: DC

## 2014-07-20 NOTE — Progress Notes (Signed)
Pre visit review using our clinic review tool, if applicable. No additional management support is needed unless otherwise documented below in the visit note.  I have personally reviewed the Medicare Annual Wellness questionnaire and have noted 1. The patient's medical and social history 2. Their use of alcohol, tobacco or illicit drugs 3. Their current medications and supplements 4. The patient's functional ability including ADL's, fall risks, home safety risks and hearing or visual             impairment. 5. Diet and physical activities 6. Evidence for depression or mood disorders  The patients weight, height, BMI have been recorded in the chart and visual acuity is per eye clinic.  I have made referrals, counseling and provided education to the patient based review of the above and I have provided the pt with a written personalized care plan for preventive services.  Provider list updated- see scanned forms.  Routine anticipatory guidance given to patient.  See health maintenance.  See scanned forms. Routine anticipatory guidance given to patient. See health maintenance. Flu 04/2014 Shingles 2009 PNA today Tetanus 2009 D/w patient [id number] for colon cancer screening, including IFOB vs. colonoscopy. Risks and benefits of both were discussed and patient voiced understanding. Pt elects VEL:FYBO.  Prostate cancer screening and PSA options (with potential risks and benefits of testing vs not testing) were discussed along with recent recs/guidelines. He declined testing PSA at this point. Advance directive. D/w pt. Encouraged. Wife would be designated if incapacitated.  Cognitive function addressed- see scanned forms- and if abnormal then additional documentation follows.  AAA screening pending.   Hypertension:    Using medication without problems or lightheadedness: yes Chest pain with exertion: usually no.  He'll get an occ chest sensation (not pain) with walking, about once a  month or less often.  "it happens about 10 steps in, then immediately resolves."  He can exercise and it doesn't happen, never was a problem playing tennis.  It isn't getting worse or more often.   Edema:no Short of breath:no  Hypothyroid.  No ADE.  No dysphagia.  No neck mass.  TSH wnl.   Elevated Cholesterol: Using medications without problems:yes Muscle aches: no Diet compliance:yes Exercise:yes  PMH and SH reviewed  Meds, vitals, and allergies reviewed.   ROS: See HPI.  Otherwise negative.    GEN: nad, alert and oriented HEENT: mucous membranes moist NECK: supple w/o LA, no tmg CV: rrr. PULM: ctab, no inc wob ABD: soft, +bs EXT: no edema SKIN: no acute rash

## 2014-07-20 NOTE — Patient Instructions (Signed)
Check your BP a few times.  If consistently >140/>90, then notify me.  Go to the lab on the way out.  We'll contact you with your lab report. James [name] will call about your referral. Take care. Glad to see you.

## 2014-07-21 ENCOUNTER — Encounter: Payer: Self-pay | Admitting: Family Medicine

## 2014-07-21 DIAGNOSIS — Z7189 Other specified counseling: Secondary | ICD-10-CM | POA: Insufficient documentation

## 2014-07-21 NOTE — Assessment & Plan Note (Signed)
Reasonable control, continue current statin, work on diet and exercise d/w pt.

## 2014-07-21 NOTE — Assessment & Plan Note (Signed)
tsh wnl, continue as is.  D/w pt.  He agrees.

## 2014-07-21 NOTE — Assessment & Plan Note (Addendum)
He'll heck his BP a few times. If consistently >140/>90, then he'll notify me.  O/w continue as is.  Labs d/w pt.  He has rare, atypical, inconsistent sx that aren't reproduced with exercise and no CP.  He'll monitor this, no extra w/u for now.  He agrees.

## 2014-07-21 NOTE — Assessment & Plan Note (Signed)
See scanned forms. Routine anticipatory guidance given to patient. See health maintenance. Flu 04/2014 Shingles 2009 PNA today Tetanus 2009 D/w patient [id number] for colon cancer screening, including IFOB vs. colonoscopy. Risks and benefits of both were discussed and patient voiced understanding. Pt elects UXL:[id number].  Prostate cancer screening and PSA options (with potential risks and benefits of testing vs not testing) were discussed along with recent recs/guidelines. He declined testing PSA at this point. Advance directive. D/w pt. Encouraged. Wife would be designated if incapacitated.  Cognitive function addressed- see scanned forms- and if abnormal then additional documentation follows.  AAA screening pending.

## 2014-07-27 ENCOUNTER — Other Ambulatory Visit (INDEPENDENT_AMBULATORY_CARE_PROVIDER_SITE_OTHER): Payer: Medicare Other

## 2014-07-27 DIAGNOSIS — Z1211 Encounter for screening for malignant neoplasm of colon: Secondary | ICD-10-CM

## 2014-07-27 LAB — FECAL OCCULT BLOOD, IMMUNOCHEMICAL: Fecal Occult Bld: NEGATIVE

## 2014-07-28 ENCOUNTER — Encounter: Payer: Self-pay | Admitting: *Deleted

## 2014-08-17 ENCOUNTER — Other Ambulatory Visit: Payer: Self-pay | Admitting: Radiology

## 2014-08-17 ENCOUNTER — Encounter (INDEPENDENT_AMBULATORY_CARE_PROVIDER_SITE_OTHER): Payer: Medicare Other

## 2014-08-17 DIAGNOSIS — Z136 Encounter for screening for cardiovascular disorders: Secondary | ICD-10-CM

## 2014-08-17 DIAGNOSIS — I700 Atherosclerosis of aorta: Secondary | ICD-10-CM

## 2014-10-06 ENCOUNTER — Ambulatory Visit (INDEPENDENT_AMBULATORY_CARE_PROVIDER_SITE_OTHER): Payer: Medicare Other | Admitting: Family Medicine

## 2014-10-06 ENCOUNTER — Encounter: Payer: Self-pay | Admitting: Family Medicine

## 2014-10-06 VITALS — BP 122/76 | HR 72 | Temp 98.5°F | Wt 256.8 lb

## 2014-10-06 DIAGNOSIS — B9789 Other viral agents as the cause of diseases classified elsewhere: Principal | ICD-10-CM

## 2014-10-06 DIAGNOSIS — J069 Acute upper respiratory infection, unspecified: Secondary | ICD-10-CM

## 2014-10-06 LAB — POCT INFLUENZA A/B
Influenza A, POC: NEGATIVE
Influenza B, POC: NEGATIVE

## 2014-10-06 NOTE — Progress Notes (Signed)
BP 122/76 mmHg  Pulse 72  Temp(Src) 98.5 F (36.9 C) (Oral)  Wt 256 lb 12 oz (116.461 kg)   CC: I think I have the flu  Subjective:    Patient ID: James [name], male    DOB: Nov 20, 1945, 69 y.o.   MRN: [id number]  HPI: James [name] is a 69 y.o. male presenting on 10/06/2014 for URI   Started feeling ill 2 nights ago, yesterday with fever, chills, chest congestion, and dry cough. Painful to cough. +body aches. Mild ST. Progressive onset of sxs. Cough slept fitfully.   No ear or tooth pain, PNDrainage, abd pain, nausea.   Wife with similar illness.  Did receive flu shot this year.  No h/o asthma or COPD.   So far has tried corcedin BP and cough drops.  Relevant past medical, surgical, family and social history reviewed and updated as indicated. Interim medical history since our last visit reviewed. Allergies and medications reviewed and updated. Current Outpatient Prescriptions on File Prior to Visit  Medication Sig  . aspirin 81 MG tablet Take 81 mg by mouth daily.    Marland Kitchen doxazosin (CARDURA) 4 MG tablet Take 1 tablet (4 mg total) by mouth daily.  Marland Kitchen ibuprofen (ADVIL,MOTRIN) 200 MG tablet Take 200 mg by mouth as needed.    Marland Kitchen levothyroxine (LEVOXYL) 137 MCG tablet TAKE 1 TABLET ONCE DAILY  . metoprolol succinate (TOPROL XL) 25 MG 24 hr tablet Take 1 tablet (25 mg total) by mouth daily.  . Multiple Vitamin (MULTIVITAMIN) tablet Take 1 tablet by mouth daily.    . Omega-3 Fatty Acids (FISH OIL) 1000 MG CAPS Take by mouth.  . ramipril (ALTACE) 10 MG capsule Take 1 capsule (10 mg total) by mouth daily.  . simvastatin (ZOCOR) 40 MG tablet Take 1 tablet (40 mg total) by mouth at bedtime.   No current facility-administered medications on file prior to visit.    Review of Systems Per HPI unless specifically indicated above     Objective:    BP 122/76 mmHg  Pulse 72  Temp(Src) 98.5 F (36.9 C) (Oral)  Wt 256 lb 12 oz (116.461 kg)  Wt Readings from Last 3 Encounters:    10/06/14 256 lb 12 oz (116.461 kg)  07/20/14 259 lb (117.482 kg)  07/16/13 253 lb 12 oz (115.1 kg)    Physical Exam  Constitutional: He appears well-developed and well-nourished. No distress.  Tired but non toxic appearing  HENT:  Head: Normocephalic and atraumatic.  Right Ear: Hearing, tympanic membrane, external ear and ear canal normal.  Left Ear: Hearing, tympanic membrane, external ear and ear canal normal.  Nose: Mucosal edema and rhinorrhea present. Right sinus exhibits no maxillary sinus tenderness and no frontal sinus tenderness. Left sinus exhibits no maxillary sinus tenderness and no frontal sinus tenderness.  Mouth/Throat: Uvula is midline and mucous membranes are normal. Posterior oropharyngeal erythema present. No oropharyngeal exudate, posterior oropharyngeal edema or tonsillar abscesses.  Nasal mucosal erythema  Eyes: Conjunctivae and EOM are normal. Pupils are equal, round, and reactive to light. No scleral icterus.  Neck: Normal range of motion. Neck supple.  Cardiovascular: Normal rate, regular rhythm, normal heart sounds and intact distal pulses.   No murmur heard. Pulmonary/Chest: Effort normal and breath sounds normal. No respiratory distress. He has no wheezes. He has no rales.  Lymphadenopathy:    He has no cervical adenopathy.  Skin: Skin is warm and dry. No rash noted.  Nursing note and vitals reviewed.  Assessment & Plan:   Problem List Items Addressed This Visit    Viral URI with cough - Primary    Anticipate viral URI - discussed supportive care as per instructions. Pt declines cough syrup today. Flu swab negative Update if not improving as expected.          Follow up plan: Return if symptoms worsen or fail to improve.

## 2014-10-06 NOTE — Assessment & Plan Note (Signed)
Anticipate viral URI - discussed supportive care as per instructions. Pt declines cough syrup today. Flu swab negative Update if not improving as expected.

## 2014-10-06 NOTE — Progress Notes (Signed)
Pre visit review using our clinic review tool, if applicable. No additional management support is needed unless otherwise documented below in the visit note. 

## 2014-10-06 NOTE — Patient Instructions (Signed)
You have a viral upper respiratory infection. Antibiotics are not needed for this.  Viral infections usually take 7-10 days to resolve.  The cough can last a few weeks to go away. Push fluids and plenty of rest. May continue corsedin Please return if you are not improving as expected over the next week, or if you have high fevers (>101.5) or difficulty swallowing or worsening productive cough. Call clinic with questions.  Good to see you today. I hope you start feeling better soon.

## 2014-10-12 ENCOUNTER — Telehealth: Payer: Self-pay

## 2014-10-12 MED ORDER — GUAIFENESIN-CODEINE 100-10 MG/5ML PO SYRP: 5.0000 mL | ORAL_SOLUTION | Freq: Every evening | ORAL | Status: DC | PRN

## 2014-10-12 NOTE — Telephone Encounter (Signed)
[facility] [address]. Called and notified patient as well. Patient verbalized understanding.

## 2014-10-12 NOTE — Telephone Encounter (Signed)
Phoned in codeine cough syrup. plz notify patient. Update if not improving with time and treatment.

## 2014-10-12 NOTE — Telephone Encounter (Signed)
Pt was seen 10/06/14; productive cough with clear phlegm has worsened and pt is requesting cough med. No fever,wheezing or SOB. Pt does have sinus congestion and pressure. Pt request cb. [facility].

## 2015-07-14 ENCOUNTER — Other Ambulatory Visit: Payer: Self-pay | Admitting: Family Medicine

## 2015-07-14 DIAGNOSIS — I10 Essential (primary) hypertension: Secondary | ICD-10-CM

## 2015-07-15 ENCOUNTER — Other Ambulatory Visit (INDEPENDENT_AMBULATORY_CARE_PROVIDER_SITE_OTHER): Payer: Medicare Other

## 2015-07-15 DIAGNOSIS — I10 Essential (primary) hypertension: Secondary | ICD-10-CM | POA: Diagnosis not present

## 2015-07-15 LAB — COMPREHENSIVE METABOLIC PANEL
ALK PHOS: 50 U/L (ref 39–117)
ALT: 8 U/L (ref 0–53)
AST: 12 U/L (ref 0–37)
Albumin: 3.8 g/dL (ref 3.5–5.2)
BUN: 15 mg/dL (ref 6–23)
CO2: 29 mEq/L (ref 19–32)
Calcium: 9.2 mg/dL (ref 8.4–10.5)
Chloride: 107 mEq/L (ref 96–112)
Creatinine, Ser: 1.3 mg/dL (ref 0.40–1.50)
GFR: 58.35 mL/min — AB (ref >60.00)
Glucose, Bld: 77 mg/dL (ref 70–99)
POTASSIUM: 4.6 meq/L (ref 3.5–5.1)
SODIUM: 142 meq/L (ref 135–145)
TOTAL PROTEIN: 6.3 g/dL (ref 6.0–8.3)
Total Bilirubin: 0.8 mg/dL (ref 0.2–1.2)

## 2015-07-15 LAB — LIPID PANEL
Cholesterol: 177 mg/dL (ref 0–200)
HDL: 42.2 mg/dL (ref >39.00)
LDL CALC: 112 mg/dL — AB (ref 0–99)
NonHDL: 134.66
Total CHOL/HDL Ratio: 4
Triglycerides: 115 mg/dL (ref 0.0–149.0)
VLDL: 23 mg/dL (ref 0.0–40.0)

## 2015-07-15 LAB — TSH: TSH: 6.37 u[IU]/mL — AB (ref 0.35–4.50)

## 2015-07-21 ENCOUNTER — Other Ambulatory Visit: Payer: Self-pay | Admitting: *Deleted

## 2015-07-21 MED ORDER — LEVOTHYROXINE SODIUM 137 MCG PO TABS: ORAL_TABLET | ORAL | Status: DC

## 2015-07-22 ENCOUNTER — Encounter: Payer: Self-pay | Admitting: Family Medicine

## 2015-07-22 ENCOUNTER — Ambulatory Visit (INDEPENDENT_AMBULATORY_CARE_PROVIDER_SITE_OTHER): Payer: Medicare Other | Admitting: Family Medicine

## 2015-07-22 VITALS — BP 136/88 | HR 58 | Temp 97.6°F | Ht 72.0 in | Wt 263.5 lb

## 2015-07-22 DIAGNOSIS — I10 Essential (primary) hypertension: Secondary | ICD-10-CM

## 2015-07-22 DIAGNOSIS — Z0000 Encounter for general adult medical examination without abnormal findings: Secondary | ICD-10-CM | POA: Diagnosis not present

## 2015-07-22 DIAGNOSIS — E039 Hypothyroidism, unspecified: Secondary | ICD-10-CM

## 2015-07-22 DIAGNOSIS — E7800 Pure hypercholesterolemia, unspecified: Secondary | ICD-10-CM | POA: Diagnosis not present

## 2015-07-22 DIAGNOSIS — Z119 Encounter for screening for infectious and parasitic diseases, unspecified: Secondary | ICD-10-CM

## 2015-07-22 DIAGNOSIS — Z1211 Encounter for screening for malignant neoplasm of colon: Secondary | ICD-10-CM

## 2015-07-22 MED ORDER — LEVOTHYROXINE SODIUM 150 MCG PO TABS
ORAL_TABLET | ORAL | Status: DC
Start: 2015-07-22 — End: 2015-07-26

## 2015-07-22 MED ORDER — SIMVASTATIN 40 MG PO TABS: 40.0000 mg | ORAL_TABLET | Freq: Every day | ORAL | Status: DC

## 2015-07-22 MED ORDER — RAMIPRIL 10 MG PO CAPS: 10.0000 mg | ORAL_CAPSULE | Freq: Every day | ORAL | Status: DC

## 2015-07-22 MED ORDER — DOXAZOSIN MESYLATE 4 MG PO TABS: 4.0000 mg | ORAL_TABLET | Freq: Every day | ORAL | Status: DC

## 2015-07-22 MED ORDER — METOPROLOL SUCCINATE ER 25 MG PO TB24: 25.0000 mg | ORAL_TABLET | Freq: Every day | ORAL | Status: DC

## 2015-07-22 NOTE — Progress Notes (Signed)
Pre visit review using our clinic review tool, if applicable. No additional management support is needed unless otherwise documented below in the visit note.  I have personally reviewed the Medicare Annual Wellness questionnaire and have noted 1. The patient's medical and social history 2. Their use of alcohol, tobacco or illicit drugs 3. Their current medications and supplements 4. The patient's functional ability including ADL's, fall risks, home safety risks and hearing or visual             impairment. 5. Diet and physical activities 6. Evidence for depression or mood disorders  The patients weight, height, BMI have been recorded in the chart and visual acuity is per eye clinic.  I have made referrals, counseling and provided education to the patient based review of the above and I have provided the pt with a written personalized care plan for preventive services.  Provider list updated- see scanned forms.  Routine anticipatory guidance given to patient.  See health maintenance.  Flu 05/2014 Shingles 2009 PNA 2015 Tetanus 2009 D/w patient [id number] for colon cancer screening, including IFOB vs. colonoscopy. Risks and benefits of both were discussed and patient voiced understanding. Pt elects [id number].  Prostate cancer screening and PSA options (with potential risks and benefits of testing vs not testing) were discussed along with recent recs/guidelines. He declined testing PSA at this point. Advance directive. D/w pt. Encouraged. Wife would be designated if incapacitated.  Cognitive function addressed- see scanned forms- and if abnormal then additional documentation follows.  AAA screening prev done.  Pt opts in for HCV screening with next set of labs.  D/w pt re: routine screening.    Hypertension:    Using medication without problems or lightheadedness: yes Chest pain with exertion:no Edema:no Short of breath:no  Elevated Cholesterol: Using medications without  problems:yes Muscle aches: not now, prev with some leg aches that resolved w/o clear cause.   Diet compliance:yes, "trying" Exercise:yes  Hypothyroidism.   TSH up.  No neck mass or lumps.  Compliant.  occ slightly sensation of mild changes with swallowing, not consistent. No formal dysphagia.    PMH and SH reviewed  Meds, vitals, and allergies reviewed.   ROS: See HPI.  Otherwise negative.    GEN: nad, alert and oriented HEENT: mucous membranes moist NECK: supple w/o LA, no tmg CV: rrr. PULM: ctab, no inc wob ABD: soft, +bs EXT: no edema SKIN: no acute rash External hemorrhoid noted on exam, not bloody (Longstanding per patient, he'll update me if worsening).

## 2015-07-22 NOTE — Patient Instructions (Addendum)
Go to the lab on the way out.  We'll contact you with your lab report (stool cards).   Take 1 tab of 149mcg levothyroid daily except for 1.5 tabs on Sundays to finish your current rx.  (7.5 tabs a week). Recheck TSH in about 2-3 months (with the HCV lab). When you get your new rx of 150mcg, change to 1 a day, assuming your TSH is normal.  Take care.  Keep working on your weight.  Glad to see you.

## 2015-07-25 NOTE — Assessment & Plan Note (Signed)
D/w pt re: tsh.  Take 1 tab of 153mcg levothyroid daily except for 1.5 tabs on Sundays to finish current rx. (7.5 tabs a week, to use up old rx supply). Recheck TSH in about 2-3 months.  When he gets new rx of 161mcg tab, change to 1 a day, assuming TSH is normal.  He agrees.

## 2015-07-25 NOTE — Assessment & Plan Note (Signed)
Flu 05/2014 Shingles 2009 PNA 2015 Tetanus 2009 D/w patient [id number] for colon cancer screening, including IFOB vs. colonoscopy. Risks and benefits of both were discussed and patient voiced understanding. Pt elects [id number].  Prostate cancer screening and PSA options (with potential risks and benefits of testing vs not testing) were discussed along with recent recs/guidelines. He declined testing PSA at this point. Advance directive. D/w pt. Encouraged. Wife would be designated if incapacitated.  Cognitive function addressed- see scanned forms- and if abnormal then additional documentation follows.  AAA screening prev done.  Pt opts in for HCV screening with next set of labs.  D/w pt re: routine screening.

## 2015-07-25 NOTE — Assessment & Plan Note (Signed)
Reasonable control, continue work on diet and exercise.  No change in meds.  He agrees.

## 2015-07-25 NOTE — Assessment & Plan Note (Signed)
Reasonable control, continue work on diet and exercise. Continue statin.

## 2015-07-26 ENCOUNTER — Telehealth: Payer: Self-pay | Admitting: *Deleted

## 2015-07-26 ENCOUNTER — Other Ambulatory Visit: Payer: Self-pay | Admitting: *Deleted

## 2015-07-26 ENCOUNTER — Other Ambulatory Visit (INDEPENDENT_AMBULATORY_CARE_PROVIDER_SITE_OTHER): Payer: Medicare Other

## 2015-07-26 DIAGNOSIS — Z1211 Encounter for screening for malignant neoplasm of colon: Secondary | ICD-10-CM | POA: Diagnosis not present

## 2015-07-26 MED ORDER — LEVOTHYROXINE SODIUM 150 MCG PO TABS: ORAL_TABLET | ORAL | Status: DC

## 2015-07-26 NOTE — Telephone Encounter (Signed)
133mcg is correct. Printed for faxing.  Thanks.

## 2015-07-26 NOTE — Telephone Encounter (Signed)
Fax received:  Mail order pharmacy says they have received two prescriptions for Levothyroxine on this patient.  One dose is 150 mcg per day and the other is for 137 mcg per day.  Please clarify which is correct and return to [facility] for faxing.

## 2015-07-26 NOTE — Telephone Encounter (Signed)
Clarified and faxed.

## 2015-07-27 ENCOUNTER — Encounter: Payer: Self-pay | Admitting: *Deleted

## 2015-07-27 LAB — FECAL OCCULT BLOOD, IMMUNOCHEMICAL: FECAL OCCULT BLD: NEGATIVE

## 2015-08-23 ENCOUNTER — Other Ambulatory Visit: Payer: Self-pay

## 2015-08-23 MED ORDER — DOXAZOSIN MESYLATE 4 MG PO TABS: 4.0000 mg | ORAL_TABLET | Freq: Every day | ORAL | Status: DC

## 2015-08-23 MED ORDER — SIMVASTATIN 40 MG PO TABS: 40.0000 mg | ORAL_TABLET | Freq: Every day | ORAL | Status: DC

## 2015-08-23 MED ORDER — METOPROLOL SUCCINATE ER 25 MG PO TB24: 25.0000 mg | ORAL_TABLET | Freq: Every day | ORAL | Status: DC

## 2015-08-23 MED ORDER — LEVOTHYROXINE SODIUM 150 MCG PO TABS: ORAL_TABLET | ORAL | Status: DC

## 2015-08-23 MED ORDER — RAMIPRIL 10 MG PO CAPS: 10.0000 mg | ORAL_CAPSULE | Freq: Every day | ORAL | Status: DC

## 2015-08-23 NOTE — Telephone Encounter (Signed)
Pt has changed ins and now using [facility] for refills; pt no longer has [employer]. Pt request refills for doxazosin, levothyroxine, metoprolol, ramipril and simvastatin to [facility]. Pt last annual 07/2015. Advised pt done.

## 2015-09-26 DIAGNOSIS — Z85820 Personal history of malignant melanoma of skin: Secondary | ICD-10-CM | POA: Diagnosis not present

## 2015-09-26 DIAGNOSIS — D2262 Melanocytic nevi of left upper limb, including shoulder: Secondary | ICD-10-CM | POA: Diagnosis not present

## 2015-09-26 DIAGNOSIS — Z85828 Personal history of other malignant neoplasm of skin: Secondary | ICD-10-CM | POA: Diagnosis not present

## 2015-09-26 DIAGNOSIS — D225 Melanocytic nevi of trunk: Secondary | ICD-10-CM | POA: Diagnosis not present

## 2015-10-20 ENCOUNTER — Other Ambulatory Visit: Payer: Self-pay | Admitting: Family Medicine

## 2015-10-20 ENCOUNTER — Other Ambulatory Visit (INDEPENDENT_AMBULATORY_CARE_PROVIDER_SITE_OTHER): Payer: Medicare Other

## 2015-10-20 DIAGNOSIS — Z119 Encounter for screening for infectious and parasitic diseases, unspecified: Secondary | ICD-10-CM | POA: Diagnosis not present

## 2015-10-20 DIAGNOSIS — E039 Hypothyroidism, unspecified: Secondary | ICD-10-CM

## 2015-10-20 LAB — TSH: TSH: 1.06 u[IU]/mL (ref 0.35–4.50)

## 2015-10-21 ENCOUNTER — Encounter: Payer: Self-pay | Admitting: *Deleted

## 2015-10-21 LAB — HEPATITIS C ANTIBODY: HCV Ab: NEGATIVE

## 2015-10-24 ENCOUNTER — Encounter: Payer: Self-pay | Admitting: *Deleted

## 2015-11-22 ENCOUNTER — Ambulatory Visit (INDEPENDENT_AMBULATORY_CARE_PROVIDER_SITE_OTHER): Payer: Medicare Other | Admitting: Family Medicine

## 2015-11-22 ENCOUNTER — Encounter: Payer: Self-pay | Admitting: Family Medicine

## 2015-11-22 VITALS — BP 148/84 | HR 65 | Temp 97.8°F | Wt 265.2 lb

## 2015-11-22 DIAGNOSIS — R195 Other fecal abnormalities: Secondary | ICD-10-CM | POA: Diagnosis not present

## 2015-11-22 LAB — COMPREHENSIVE METABOLIC PANEL
ALBUMIN: 4 g/dL (ref 3.5–5.2)
ALT: 10 U/L (ref 0–53)
AST: 15 U/L (ref 0–37)
Alkaline Phosphatase: 52 U/L (ref 39–117)
BUN: 14 mg/dL (ref 6–23)
CHLORIDE: 104 meq/L (ref 96–112)
CO2: 30 mEq/L (ref 19–32)
CREATININE: 1.19 mg/dL (ref 0.40–1.50)
Calcium: 10.1 mg/dL (ref 8.4–10.5)
GFR: 64.55 mL/min (ref >60.00)
GLUCOSE: 89 mg/dL (ref 70–99)
POTASSIUM: 4.8 meq/L (ref 3.5–5.1)
SODIUM: 139 meq/L (ref 135–145)
Total Bilirubin: 0.8 mg/dL (ref 0.2–1.2)
Total Protein: 6.8 g/dL (ref 6.0–8.3)

## 2015-11-22 LAB — CBC WITH DIFFERENTIAL/PLATELET
BASOS PCT: 0.4 % (ref 0.0–3.0)
Basophils Absolute: 0 10*3/uL (ref 0.0–0.1)
EOS ABS: 0.1 10*3/uL (ref 0.0–0.7)
EOS PCT: 0.9 % (ref 0.0–5.0)
HCT: 44.2 % (ref 39.0–52.0)
HEMOGLOBIN: 14.9 g/dL (ref 13.0–17.0)
LYMPHS ABS: 3.8 10*3/uL (ref 0.7–4.0)
Lymphocytes Relative: 39 % (ref 12.0–46.0)
MCHC: 33.8 g/dL (ref 30.0–36.0)
MCV: 88.8 fl (ref 78.0–100.0)
MONO ABS: 0.6 10*3/uL (ref 0.1–1.0)
MONOS PCT: 6.2 % (ref 3.0–12.0)
NEUTROS PCT: 53.5 % (ref 43.0–77.0)
Neutro Abs: 5.2 10*3/uL (ref 1.4–7.7)
Platelets: 166 10*3/uL (ref 150.0–400.0)
RBC: 4.97 Mil/uL (ref 4.22–5.81)
RDW: 13.6 % (ref 11.5–15.5)
WBC: 9.6 10*3/uL (ref 4.0–10.5)

## 2015-11-22 MED ORDER — HYDROCORTISONE 2.5 % RE CREA: TOPICAL_CREAM | RECTAL | Status: AC

## 2015-11-22 NOTE — Patient Instructions (Addendum)
Go to the lab on the way out.  We'll contact you with your lab report. Rosaria Ferries will call about your referral (GI in [place]) Don't change your meds for now.  Take care.  Glad to see you.

## 2015-11-22 NOTE — Assessment & Plan Note (Signed)
Check cbc and cmet.  I didn't check ifob again, if pos or neg would still refer to GI, d/w pt.  He agrees.  Refer to gi.  ddx d/w pt.  There is the possibility of occult malignancy, but still could be from polyp, AVM, etc.  Not acute abd, okay for outpatient f/u.  abd not ttp.

## 2015-11-22 NOTE — Progress Notes (Signed)
Pre visit review using our clinic review tool, if applicable. No additional management support is needed unless otherwise documented below in the visit note.  GI complaint.  Longstanding- he would have urge to have BM, but then couldn't have BM at that moment.  "I've learned to be patient."  In the last few weeks, more trouble again with BMs, more trouble getting a full void.  Has sensation of incomplete voiding.   H/o hemorrhoid noted, not having sx from that recently- does have rare small amount of BRBPR presumed from the hemorrhoid.  No blood in stool recently.  Still with soft stools, not hard until recently, now harder and darker.  Not on iron, not on new meds.  Stool cards always neg when done in the last few years, done yearly.   He can feel bloated episodically, better after BM.  No FH colon cancer.    O/w no FCNAV.  Feels at baseline o/w.   PMH and SH reviewed  ROS: See HPI, otherwise noncontributory.  Meds, vitals, and allergies reviewed.   GEN: nad, alert and oriented OP: MMM NECK: supple w/o LA CV: rrr PULM: ctab, no inc wob ABD: soft, +bs, not ttp, no rebound.  EXT: no edema SKIN: no acute rash

## 2015-11-22 NOTE — Addendum Note (Signed)
Addended by: Tonia Ghent on: 11/22/2015 11:33 AM   Modules accepted: Orders

## 2015-11-23 ENCOUNTER — Ambulatory Visit (AMBULATORY_SURGERY_CENTER): Payer: Self-pay | Admitting: *Deleted

## 2015-11-23 VITALS — Ht 72.0 in | Wt 265.4 lb

## 2015-11-23 DIAGNOSIS — Z1211 Encounter for screening for malignant neoplasm of colon: Secondary | ICD-10-CM

## 2015-11-23 NOTE — Progress Notes (Signed)
Patient denies any allergies to egg or soy products. Patient denies complications with anesthesia/sedation.  Patient denies oxygen use at home and denies diet medications. Emmi instructions for colonoscopy explained and given to patient.  

## 2015-11-24 ENCOUNTER — Encounter: Payer: Self-pay | Admitting: *Deleted

## 2015-12-05 ENCOUNTER — Encounter: Payer: Self-pay | Admitting: *Deleted

## 2015-12-05 ENCOUNTER — Observation Stay
Admission: EM | Admit: 2015-12-05 | Discharge: 2015-12-06 | Disposition: A | Discharge status: Home / Self Care | Payer: Medicare Other | Attending: Internal Medicine | Admitting: Internal Medicine

## 2015-12-05 ENCOUNTER — Telehealth: Payer: Self-pay | Admitting: Internal Medicine

## 2015-12-05 ENCOUNTER — Encounter: Payer: Self-pay | Admitting: Internal Medicine

## 2015-12-05 ENCOUNTER — Ambulatory Visit (AMBULATORY_SURGERY_CENTER): Payer: Medicare Other | Admitting: Internal Medicine

## 2015-12-05 VITALS — BP 133/80 | HR 49 | Temp 97.3°F | Resp 12 | Ht 72.0 in | Wt 265.0 lb

## 2015-12-05 DIAGNOSIS — Z791 Long term (current) use of non-steroidal anti-inflammatories (NSAID): Secondary | ICD-10-CM | POA: Diagnosis not present

## 2015-12-05 DIAGNOSIS — E785 Hyperlipidemia, unspecified: Secondary | ICD-10-CM | POA: Insufficient documentation

## 2015-12-05 DIAGNOSIS — Z9852 Vasectomy status: Secondary | ICD-10-CM | POA: Insufficient documentation

## 2015-12-05 DIAGNOSIS — Z87891 Personal history of nicotine dependence: Secondary | ICD-10-CM | POA: Insufficient documentation

## 2015-12-05 DIAGNOSIS — E039 Hypothyroidism, unspecified: Secondary | ICD-10-CM | POA: Diagnosis not present

## 2015-12-05 DIAGNOSIS — Z9842 Cataract extraction status, left eye: Secondary | ICD-10-CM | POA: Diagnosis not present

## 2015-12-05 DIAGNOSIS — Z79899 Other long term (current) drug therapy: Secondary | ICD-10-CM | POA: Insufficient documentation

## 2015-12-05 DIAGNOSIS — Z85828 Personal history of other malignant neoplasm of skin: Secondary | ICD-10-CM | POA: Insufficient documentation

## 2015-12-05 DIAGNOSIS — K625 Hemorrhage of anus and rectum: Secondary | ICD-10-CM | POA: Diagnosis present

## 2015-12-05 DIAGNOSIS — Z86010 Personal history of colonic polyps: Secondary | ICD-10-CM

## 2015-12-05 DIAGNOSIS — K91840 Postprocedural hemorrhage and hematoma of a digestive system organ or structure following a digestive system procedure: Principal | ICD-10-CM | POA: Insufficient documentation

## 2015-12-05 DIAGNOSIS — I10 Essential (primary) hypertension: Secondary | ICD-10-CM | POA: Diagnosis not present

## 2015-12-05 DIAGNOSIS — Z7982 Long term (current) use of aspirin: Secondary | ICD-10-CM | POA: Diagnosis not present

## 2015-12-05 DIAGNOSIS — Z1211 Encounter for screening for malignant neoplasm of colon: Secondary | ICD-10-CM

## 2015-12-05 DIAGNOSIS — R195 Other fecal abnormalities: Secondary | ICD-10-CM | POA: Diagnosis not present

## 2015-12-05 DIAGNOSIS — R109 Unspecified abdominal pain: Secondary | ICD-10-CM | POA: Diagnosis not present

## 2015-12-05 DIAGNOSIS — Z9889 Other specified postprocedural states: Secondary | ICD-10-CM | POA: Diagnosis not present

## 2015-12-05 DIAGNOSIS — D123 Benign neoplasm of transverse colon: Secondary | ICD-10-CM

## 2015-12-05 DIAGNOSIS — K922 Gastrointestinal hemorrhage, unspecified: Secondary | ICD-10-CM | POA: Insufficient documentation

## 2015-12-05 DIAGNOSIS — D122 Benign neoplasm of ascending colon: Secondary | ICD-10-CM

## 2015-12-05 DIAGNOSIS — Z9841 Cataract extraction status, right eye: Secondary | ICD-10-CM | POA: Insufficient documentation

## 2015-12-05 DIAGNOSIS — D124 Benign neoplasm of descending colon: Secondary | ICD-10-CM

## 2015-12-05 DIAGNOSIS — K635 Polyp of colon: Secondary | ICD-10-CM

## 2015-12-05 DIAGNOSIS — K649 Unspecified hemorrhoids: Secondary | ICD-10-CM | POA: Insufficient documentation

## 2015-12-05 DIAGNOSIS — Z860101 Personal history of adenomatous and serrated colon polyps: Secondary | ICD-10-CM

## 2015-12-05 HISTORY — DX: Personal history of colonic polyps: Z86.010

## 2015-12-05 HISTORY — DX: Personal history of adenomatous and serrated colon polyps: Z86.0101

## 2015-12-05 LAB — COMPREHENSIVE METABOLIC PANEL
ALK PHOS: 55 U/L (ref 38–126)
ALT: 12 U/L — AB (ref 17–63)
AST: 16 U/L (ref 15–41)
Albumin: 3.9 g/dL (ref 3.5–5.0)
Anion gap: 8 (ref 5–15)
BUN: 15 mg/dL (ref 6–20)
CALCIUM: 9.2 mg/dL (ref 8.9–10.3)
CO2: 25 mmol/L (ref 22–32)
CREATININE: 1.4 mg/dL — AB (ref 0.61–1.24)
Chloride: 108 mmol/L (ref 101–111)
GFR, EST AFRICAN AMERICAN: 58 mL/min — AB (ref >60)
GFR, EST NON AFRICAN AMERICAN: 50 mL/min — AB (ref >60)
Glucose, Bld: 104 mg/dL — ABNORMAL HIGH (ref 65–99)
Potassium: 3.7 mmol/L (ref 3.5–5.1)
Sodium: 141 mmol/L (ref 135–145)
Total Bilirubin: 1 mg/dL (ref 0.3–1.2)
Total Protein: 6.7 g/dL (ref 6.5–8.1)

## 2015-12-05 LAB — CBC
HCT: 43.6 % (ref 40.0–52.0)
Hemoglobin: 14.9 g/dL (ref 13.0–18.0)
MCH: 30.2 pg (ref 26.0–34.0)
MCHC: 34.3 g/dL (ref 32.0–36.0)
MCV: 88 fL (ref 80.0–100.0)
PLATELETS: 148 10*3/uL — AB (ref 150–440)
RBC: 4.95 MIL/uL (ref 4.40–5.90)
RDW: 13.6 % (ref 11.5–14.5)
WBC: 11.2 10*3/uL — AB (ref 3.8–10.6)

## 2015-12-05 MED ORDER — SODIUM CHLORIDE 0.9 % IV SOLN: 500.0000 mL | INTRAVENOUS | Status: DC

## 2015-12-05 NOTE — Progress Notes (Signed)
Report given to PACU RN, vss 

## 2015-12-05 NOTE — ED Notes (Addendum)
Pt ambulatory to triage.  Pt states he had a colonoscopy today in [place].  Tonight, pt has diarrhea and rectal bleeding.  Pt denies abd pain.  No dizziness.  Hx hemorrhoids.  Pt alert.  Speech clear.

## 2015-12-05 NOTE — Progress Notes (Signed)
No problems noted in the recovery room. maw 

## 2015-12-05 NOTE — Telephone Encounter (Signed)
Pt called tonight at 20:00 Colon today.  Multiple polyps removed. One 25 mm polyp removed from transverse colon with hot snare.  Other subcentimeter polyps removed from the left colon with cold snare. About 10 min ago he had a large bloody BM associated with minor lower abd cramping.  He described the bleeding as "excessive". No dizziness, lightheadedness, cp, dyspnea, fever or chills. He lives in [place] I instructed him to contact 911 and go immediately to the ER if he has any further bloody BMs tonight. Same recommendation in the event of abd pain, fever, nausea or vomiting. In the event of more bleeding, I instructed that he not drive to [facility], but rather go to [facility] (local ED for eval). He voiced understanding I advised he can call me back tonight if needed He thanked me for the call

## 2015-12-05 NOTE — Progress Notes (Signed)
Called to room to assist during endoscopic procedure.  Patient ID and intended procedure confirmed with present staff. Received instructions for my participation in the procedure from the performing physician.  

## 2015-12-05 NOTE — Op Note (Signed)
[place] Patient Name: James [name] Procedure Date: 12/05/2015 10:09 AM MRN: [id number] Endoscopist: Gatha Mayer , MD Age: 70 Date of Birth: 12/12/1945 Gender: Male Procedure:                Colonoscopy Indications:              Screening for colorectal malignant neoplasm, This                            is the patient's first colonoscopy Medicines:                Propofol per Anesthesia, Monitored Anesthesia Care Procedure:                Pre-Anesthesia Assessment:                           - Prior to the procedure, a History and Physical                            was performed, and patient medications and                            allergies were reviewed. The patient's tolerance of                            previous anesthesia was also reviewed. The risks                            and benefits of the procedure and the sedation                            options and risks were discussed with the patient.                            All questions were answered, and informed consent                            was obtained. Prior Anticoagulants: The patient                            last took aspirin 4 days prior to the procedure.                            ASA Grade Assessment: II - A patient with mild                            systemic disease. After reviewing the risks and                            benefits, the patient was deemed in satisfactory                            condition to undergo the procedure.  After obtaining informed consent, the colonoscope                            was passed under direct vision. Throughout the                            procedure, the patient's blood pressure, pulse, and                            oxygen saturations were monitored continuously. The                            Model CF-HQ190L [id number]) scope was introduced                            through the anus and advanced to the the cecum,                           identified by appendiceal orifice and ileocecal                            valve. The colonoscopy was performed without                            difficulty. The patient tolerated the procedure                            well. The quality of the bowel preparation was                            excellent. The bowel preparation used was Miralax.                            The ileocecal valve, appendiceal orifice, and                            rectum were photographed. Scope In: 10:24:22 AM Scope Out: 10:48:34 AM Scope Withdrawal Time: 0 hours 22 minutes 34 seconds  Total Procedure Duration: 0 hours 24 minutes 12 seconds  Findings:                 The digital rectal exam findings include increased                            firmness of the prostate. Pertinent negatives                            include no palpable rectal lesions.                           A 25 mm polyp was found in the transverse colon.                            The polyp was semi-sessile. The polyp was removed  with a hot snare. Resection and retrieval were                            complete. Verification of patient identification                            for the specimen was done. Estimated blood loss was                            minimal. Area was tattooed with an injection of 3                            mL of Spot (carbon black). Estimated blood loss:                            none.                           Three sessile polyps were found in the transverse                            colon and ascending colon. The polyps were 3 to 5                            mm in size. These polyps were removed with a cold                            snare. Resection and retrieval were complete.                            Verification of patient identification for the                            specimen was done. Estimated blood loss was minimal.                           A 2  mm polyp was found in the descending colon. The                            polyp was sessile. The polyp was removed with a                            cold biopsy forceps. Resection and retrieval were                            complete. Verification of patient identification                            for the specimen was done. Estimated blood loss was                            minimal.  Multiple small and large-mouthed diverticula were                            found in the entire colon. There was no evidence of                            diverticular bleeding.                           The exam was otherwise without abnormality on                            direct and retroflexion views. Complications:            No immediate complications. Estimated Blood Loss:     Estimated blood loss was minimal. Impression:               - Increased firmness of the prostate found on                            digital rectal exam.                           - One 25 mm polyp in the transverse colon, removed                            with a hot snare. Resected and retrieved. Tattooed.                           - Three 3 to 5 mm polyps in the transverse colon                            and in the ascending colon, removed with a cold                            snare. Resected and retrieved.                           - One 2 mm polyp in the descending colon, removed                            with a cold biopsy forceps. Resected and retrieved.                           - Moderate diverticulosis in the entire examined                            colon. There was no evidence of diverticular                            bleeding.                           - The examination was otherwise normal on direct  and retroflexion views. Recommendation:           - Patient has a contact number available for                            emergencies. The signs and  symptoms of potential                            delayed complications were discussed with the                            patient. Return to normal activities tomorrow.                            Written discharge instructions were provided to the                            patient.                           - Resume previous diet.                           - Continue present medications.                           - No aspirin, ibuprofen, naproxen, or other                            non-steroidal anti-inflammatory drugs for 2 weeks                            after polyp removal.                           - Repeat colonoscopy is recommended for                            surveillance after piecemeal polypectomy. The                            colonoscopy date will be determined after pathology                            results from today's exam become available for                            review. Gatha Mayer, MD 12/05/2015 11:01:54 AM This report has been signed electronically. CC Letter to:             Elveria Rising. Damita Dunnings

## 2015-12-05 NOTE — Patient Instructions (Addendum)
I found and removed 5 polyps - all look beneign but one was large and will need a recheck within 1 year I suspect - to make sure I removed it all.  I will let you know pathology results and when to have another routine colonoscopy by mail.  You also have a condition called diverticulosis - common and not usually a problem. Please read the handout provided.  I appreciate the opportunity to care for you. Gatha Mayer, MD, FACG      YOU HAD AN ENDOSCOPIC PROCEDURE TODAY AT [facility]:   Refer to the procedure report that was given to you for any specific questions about what was found during the examination.  If the procedure report does not answer your questions, please call your gastroenterologist to clarify.  If you requested that your care partner not be given the details of your procedure findings, then the procedure report has been included in a sealed envelope for you to review at your convenience later.  YOU SHOULD EXPECT: Some feelings of bloating in the abdomen. Passage of more gas than usual.  Walking can help get rid of the air that was put into your GI tract during the procedure and reduce the bloating. If you had a lower endoscopy (such as a colonoscopy or flexible sigmoidoscopy) you may notice spotting of blood in your stool or on the toilet paper. If you underwent a bowel prep for your procedure, you may not have a normal bowel movement for a few days.  Please Note:  You might notice some irritation and congestion in your nose or some drainage.  This is from the oxygen used during your procedure.  There is no need for concern and it should clear up in a day or so.  SYMPTOMS TO REPORT IMMEDIATELY:   Following lower endoscopy (colonoscopy or flexible sigmoidoscopy):  Excessive amounts of blood in the stool  Significant tenderness or worsening of abdominal pains  Swelling of the abdomen that is new, acute  Fever of 100F or higher   For urgent or  emergent issues, a gastroenterologist can be reached at any hour by calling [phone].   DIET: Your first meal following the procedure should be a small meal and then it is ok to progress to your normal diet. Heavy or fried foods are harder to digest and may make you feel nauseous or bloated.  Likewise, meals heavy in dairy and vegetables can increase bloating.  Drink plenty of fluids but you should avoid alcoholic beverages for 24 hours.  ACTIVITY:  You should plan to take it easy for the rest of today and you should NOT DRIVE or use heavy machinery until tomorrow (because of the sedation medicines used during the test).    FOLLOW UP: Our staff will call the number listed on your records the next business day following your procedure to check on you and address any questions or concerns that you may have regarding the information given to you following your procedure. If we do not reach you, we will leave a message.  However, if you are feeling well and you are not experiencing any problems, there is no need to return our call.  We will assume that you have returned to your regular daily activities without incident.  If any biopsies were taken you will be contacted by phone or by letter within the next 1-3 weeks.  Please call us at [phone] if you have not heard about the  biopsies in 3 weeks.    SIGNATURES/CONFIDENTIALITY: You and/or your care partner have signed paperwork which will be entered into your electronic medical record.  These signatures attest to the fact that that the information above on your After Visit Summary has been reviewed and is understood.  Full responsibility of the confidentiality of this discharge information lies with you and/or your care-partner.   Handouts were given to your care partner on polyps and diverticulosis. Please hold aspirin, ibuprofen, naproxen, or NSIADS for 2 weeks. You may resume your other current medications today. Await biopsy  results. Please call if any questions or concerns.

## 2015-12-06 ENCOUNTER — Encounter: Payer: Self-pay | Admitting: Internal Medicine

## 2015-12-06 ENCOUNTER — Telehealth: Payer: Self-pay

## 2015-12-06 ENCOUNTER — Telehealth: Payer: Self-pay | Admitting: Internal Medicine

## 2015-12-06 DIAGNOSIS — R109 Unspecified abdominal pain: Secondary | ICD-10-CM | POA: Diagnosis not present

## 2015-12-06 DIAGNOSIS — K922 Gastrointestinal hemorrhage, unspecified: Secondary | ICD-10-CM | POA: Diagnosis not present

## 2015-12-06 DIAGNOSIS — E785 Hyperlipidemia, unspecified: Secondary | ICD-10-CM | POA: Diagnosis not present

## 2015-12-06 DIAGNOSIS — I10 Essential (primary) hypertension: Secondary | ICD-10-CM | POA: Diagnosis not present

## 2015-12-06 LAB — HEMOGLOBIN
HEMOGLOBIN: 14 g/dL (ref 13.0–18.0)
Hemoglobin: 13.8 g/dL (ref 13.0–18.0)

## 2015-12-06 LAB — BASIC METABOLIC PANEL
Anion gap: 5 (ref 5–15)
BUN: 16 mg/dL (ref 6–20)
CHLORIDE: 111 mmol/L (ref 101–111)
CO2: 26 mmol/L (ref 22–32)
CREATININE: 1.26 mg/dL — AB (ref 0.61–1.24)
Calcium: 8.9 mg/dL (ref 8.9–10.3)
GFR calc Af Amer: >60 mL/min (ref >60)
GFR, EST NON AFRICAN AMERICAN: 57 mL/min — AB (ref >60)
GLUCOSE: 92 mg/dL (ref 65–99)
POTASSIUM: 4.6 mmol/L (ref 3.5–5.1)
SODIUM: 142 mmol/L (ref 135–145)

## 2015-12-06 MED ORDER — DOXAZOSIN MESYLATE 4 MG PO TABS: 4.0000 mg | ORAL_TABLET | Freq: Every day | ORAL | Status: DC

## 2015-12-06 MED ORDER — ACETAMINOPHEN 325 MG PO TABS: 650.0000 mg | ORAL_TABLET | Freq: Four times a day (QID) | ORAL | Status: DC | PRN

## 2015-12-06 MED ORDER — ONDANSETRON HCL 4 MG PO TABS
4.0000 mg | ORAL_TABLET | Freq: Four times a day (QID) | ORAL | Status: DC | PRN
Start: 2015-12-06 — End: 2015-12-06

## 2015-12-06 MED ORDER — ONDANSETRON HCL 4 MG/2ML IJ SOLN: 4.0000 mg | Freq: Four times a day (QID) | INTRAMUSCULAR | Status: DC | PRN

## 2015-12-06 MED ORDER — RAMIPRIL 5 MG PO CAPS: 10.0000 mg | ORAL_CAPSULE | Freq: Every day | ORAL | Status: DC

## 2015-12-06 MED ORDER — HYDROCORTISONE 2.5 % RE CREA
TOPICAL_CREAM | Freq: Three times a day (TID) | RECTAL | Status: DC
  Filled 2015-12-06: qty 28.35

## 2015-12-06 MED ORDER — LEVOTHYROXINE SODIUM 75 MCG PO TABS: 75.0000 ug | ORAL_TABLET | Freq: Every day | ORAL | Status: DC

## 2015-12-06 MED ORDER — SIMVASTATIN 40 MG PO TABS: 40.0000 mg | ORAL_TABLET | Freq: Every day | ORAL | Status: DC

## 2015-12-06 MED ORDER — ACETAMINOPHEN 650 MG RE SUPP: 650.0000 mg | Freq: Four times a day (QID) | RECTAL | Status: DC | PRN

## 2015-12-06 MED ORDER — PANTOPRAZOLE SODIUM 40 MG IV SOLR
40.0000 mg | Freq: Two times a day (BID) | INTRAVENOUS | Status: DC
  Administered 2015-12-06: 40 mg via INTRAVENOUS
  Filled 2015-12-06: qty 40

## 2015-12-06 MED ORDER — METOPROLOL SUCCINATE ER 25 MG PO TB24: 25.0000 mg | ORAL_TABLET | Freq: Every day | ORAL | Status: DC

## 2015-12-06 MED ORDER — SODIUM CHLORIDE 0.9 % IV SOLN
INTRAVENOUS | Status: DC
  Administered 2015-12-06: 02:00:00 via INTRAVENOUS

## 2015-12-06 NOTE — Telephone Encounter (Signed)
  Follow up Call-  Call back number 12/05/2015  Post procedure Call Back phone  # [phone]  Permission to leave phone message Yes     Patient questions:  Do you have a fever, pain , or abdominal swelling? No. Pain Score  0 *  Have you tolerated food without any problems? Yes.    Have you been able to return to your normal activities? No.  Do you have any questions about your discharge instructions: Diet   No. Medications  No. Follow up visit  No.  Do you have questions or concerns about your Care? No.  Actions: * If pain score is 4 or above: No action needed, pain <4. Patient admitted to hospital for rectal bleeding.

## 2015-12-06 NOTE — Telephone Encounter (Signed)
He is admitted at [facility] Last bleeding midnight Feels ok  CBC Latest Ref Rng 12/06/2015 12/06/2015 12/05/2015  WBC 3.8 - 10.6 K/uL - - 11.2(H)  Hemoglobin 13.0 - 18.0 g/dL 14.0 13.8 14.9  Hematocrit 40.0 - 52.0 % - - 43.6  Platelets 150 - 440 K/uL - - 148(L)   VSS/NL He thinks they may release him this AM.  I will f/u by phone w/ him and he knows to call back if more bleeding.

## 2015-12-06 NOTE — Telephone Encounter (Signed)
This encounter was created in error - please disregard.

## 2015-12-06 NOTE — Telephone Encounter (Signed)
Pt presented to [facility] after another bloody BM Hgb was stable there ED MD was to decide whether to admit to obs at [facility] or transfer to [facility] Looking at census, I do not see him here

## 2015-12-06 NOTE — ED Provider Notes (Signed)
[facility] Emergency Department Provider Note   ____________________________________________  Time seen: Approximately 2327 PM  I have reviewed the triage vital signs and the nursing notes.   HISTORY  Chief Complaint Rectal Bleeding    HPI ZACARIAS DILALLO is a 70 y.o. male who comes into the hospital today with rectal bleeding. The patient reports that he had a colonoscopy this morning and they removed a large polyp. He was warned that he may have some bleeding but he reports that his first bowel movement was very bloody at 6 PM. The patient had a second bowel movement a short while later and reports that it was still bloody but not as bad. The patient had his procedure in [place]. He contacted the on-call physician and was told that if after the first bowel movement he was still having bloody stools that he should come into the emergency department here. He reports that he had some lower abdominal cramping earlier but is currently resolved. He reports it is watery stool this dark material with blood in it. He denies any dizziness or lightheadedness. The patient was concerned as he was continuing to bleed so he came in to get checked out.   Past Medical History  Diagnosis Date  . Hyperlipidemia   . Hypertension   . Hypothyroid   . Hemorrhoids   . Skin cancer     basal cell, R ear, MOHS  . Hepatitis     self resolved, likely food exposure, 1974.   . Cataract     bilateral surgery to remove  . Constipation     Patient Active Problem List   Diagnosis Date Noted  . GI bleed 12/06/2015  . Dark stools 11/22/2015  . Advance care planning 07/21/2014  . Medicare annual wellness visit, subsequent 07/01/2012  . External hemorrhoids 06/27/2011  . Hypothyroidism 02/24/2008  . HYPERCHOLESTEROLEMIA 02/21/2007  . HYPERTENSION, BENIGN ESSENTIAL 02/21/2007  . DEGENERATIVE JOINT DISEASE, CERVICAL SPINE 02/21/2007    Past Surgical History  Procedure  Laterality Date  . Tonsillectomy    . Vasectomy  1982  . Cataract extraction  1986    OD  . Cataract extraction Bilateral 1995    x 2 for right and left   . Lipoma excision  06/1997    left neck ([place])  . Mohs surgery Right 2013    behind right ear    Current Outpatient Rx  Name  Route  Sig  Dispense  Refill  . aspirin 81 MG tablet   Oral   Take 81 mg by mouth daily.           Marland Kitchen doxazosin (CARDURA) 4 MG tablet   Oral   Take 1 tablet (4 mg total) by mouth daily.   90 tablet   3   . hydrocortisone (ANUSOL-HC) 2.5 % rectal cream      Apply rectally 3  times daily as needed.   30 g   6   . ibuprofen (ADVIL,MOTRIN) 200 MG tablet   Oral   Take 200 mg by mouth as needed.           Marland Kitchen levothyroxine (SYNTHROID, LEVOTHROID) 150 MCG tablet      TAKE 1 TABLET ONCE DAILY   90 tablet   3   . metoprolol succinate (TOPROL XL) 25 MG 24 hr tablet   Oral   Take 1 tablet (25 mg total) by mouth daily.   90 tablet   3   . Multiple Vitamin (MULTIVITAMIN)  tablet   Oral   Take 1 tablet by mouth daily.           . Omega-3 Fatty Acids (FISH OIL) 1000 MG CAPS   Oral   Take by mouth.         . psyllium (METAMUCIL) 58.6 % powder   Oral   Take 1 packet by mouth daily.         . ramipril (ALTACE) 10 MG capsule   Oral   Take 1 capsule (10 mg total) by mouth daily.   90 capsule   3   . simvastatin (ZOCOR) 40 MG tablet   Oral   Take 1 tablet (40 mg total) by mouth at bedtime.   90 tablet   3     Allergies Sulfonamide derivatives  Family History  Problem Relation Age of Onset  . Cancer Maternal Grandfather     lung, smoker  . Stroke Maternal Grandfather   . Heart disease Paternal Grandfather     MI, old age  . Stroke Mother   . Colon cancer Neg Hx   . Colon polyps Neg Hx   . Esophageal cancer Neg Hx   . Rectal cancer Neg Hx   . Stomach cancer Neg Hx     Social History Social History  Substance Use Topics  . Smoking status: Former Smoker -- 2.00  packs/day for 20 years    Types: Cigarettes    Quit date: 08/07/1983  . Smokeless tobacco: Never Used     Comment: quit over 30 years ago  . Alcohol Use: 4.2 oz/week    7 Shots of liquor per week     Comment: a drink every evening, scotch or whiskey    Review of Systems Constitutional: No fever/chills Eyes: No visual changes. ENT: No sore throat. Cardiovascular: Denies chest pain. Respiratory: Denies shortness of breath. Gastrointestinal: Abdominal cramping and diarrhea.  No constipation. Genitourinary: Rectal bleeding Musculoskeletal: Negative for back pain. Skin: Negative for rash. Neurological: Negative for headaches, focal weakness or numbness.  10-point ROS otherwise negative.  ____________________________________________   PHYSICAL EXAM:  VITAL SIGNS: ED Triage Vitals  Enc Vitals Group     BP 12/05/15 2136 161/84 mmHg     Pulse Rate 12/05/15 2136 67     Resp 12/05/15 2136 20     Temp 12/05/15 2136 98.2 F (36.8 C)     Temp Source 12/05/15 2136 Oral     SpO2 12/05/15 2136 98 %     Weight 12/05/15 2136 255 lb (115.667 kg)     Height 12/05/15 2136 6\' 1"  (1.854 m)     Head Cir --      Peak Flow --      Pain Score 12/05/15 2137 1     Pain Loc --      Pain Edu? --      Excl. in [place]? --     Constitutional: Alert and oriented. Well appearing and in no acute distress. Eyes: Conjunctivae are normal. PERRL. EOMI. Head: Atraumatic. Nose: No congestion/rhinnorhea. Mouth/Throat: Mucous membranes are moist.  Oropharynx non-erythematous. Cardiovascular: Normal rate, regular rhythm. Grossly normal heart sounds.  Good peripheral circulation. Respiratory: Normal respiratory effort.  No retractions. Lungs CTAB. Gastrointestinal: Soft and nontender. No distention. Positive bowel sounds Rectal: Gross blood noted on rectal exam with no pain, hemorrhoid also noted on rectal exam. Musculoskeletal: No lower extremity tenderness nor edema.  Neurologic:  Normal speech and  language.  Skin:  Skin is warm, dry and intact.  Psychiatric: Mood and affect are normal.   ____________________________________________   LABS (all labs ordered are listed, but only abnormal results are displayed)  Labs Reviewed  COMPREHENSIVE METABOLIC PANEL - Abnormal; Notable for the following:    Glucose, Bld 104 (*)    Creatinine, Ser 1.40 (*)    ALT 12 (*)    GFR calc non Af Amer 50 (*)    GFR calc Af Amer 58 (*)    All other components within normal limits  CBC - Abnormal; Notable for the following:    WBC 11.2 (*)    Platelets 148 (*)    All other components within normal limits  HEMOGLOBIN  HEMOGLOBIN  HEMOGLOBIN  HEMOGLOBIN   ____________________________________________  EKG  none ____________________________________________  RADIOLOGY  none ____________________________________________   PROCEDURES  Procedure(s) performed: None  Critical Care performed: No  ____________________________________________   INITIAL IMPRESSION / ASSESSMENT AND PLAN / ED COURSE  Pertinent labs & imaging results that were available during my care of the patient were reviewed by me and considered in my medical decision making (see chart for details).  This is a 70 year old male who comes into the hospital today with rectal bleeding. The patient had a colonoscopy done today and had a large polyp removed. I did contact the physician on-call for the GI group and he reports that he recommended the patient coming here as he was concerned with the amount of bleeding that the patient reported. He reports though that he thinks the patient should be observed overnight and have a repeat colonoscopy. He said that it is at the patient's discretion whether he wants to be transferred or stay here at [facility]. I did discuss it with the patient and he says he would like to stay at this hospital. The patient will be admitted to the hospitalist service and he will be  reevaluated by GI in the morning. ____________________________________________   FINAL CLINICAL IMPRESSION(S) / ED DIAGNOSES  Final diagnoses:  Gastrointestinal hemorrhage associated with anorectal source      NEW MEDICATIONS STARTED DURING THIS VISIT:  New Prescriptions   No medications on file     Note:  This document was prepared using Dragon voice recognition software and may include unintentional dictation errors.   Loney Hering, MD 12/06/15 [phone]

## 2015-12-06 NOTE — H&P (Signed)
[place] [facility] [place] NAME: James [name]    MR#:  [id number]  DATE OF BIRTH:  [birth year]  DATE OF ADMISSION:  12/05/2015  PRIMARY CARE PHYSICIAN: Elsie Stain, MD   REQUESTING/REFERRING PHYSICIAN:   CHIEF COMPLAINT:   Chief Complaint  Patient presents with  . Rectal Bleeding    HISTORY OF PRESENT ILLNESS: James [name]  is a 70 y.o. male with a known history of hyperlipidemia, hypertension, hypothyroidism, basal cell cancer of skin presented to the emergency room for bleeding per rectum. Patient had colonoscopy yesterday at [facility] with gastroenterology clinic at [place]. Polyps were removed during colonoscopy. Patient went home and he had rectal bleed and 3 episodes and he came to the emergency room at our hospital. Case was discussed by ER attending with gastroenterology attending on call for [facility] who recommended the patient can be admitted at [facility]. No history of any vomiting of blood. Has mild abdominal discomfort which is aching in nature 2 out of 10 on a scale of 1-10. No history of fever or chills. Patient currently takes oral aspirin and Motrin as needed for pain. No history of chest pain. No history of any shortness of breath. No headache dizziness or blurry vision. No history of syncope or seizure.  PAST MEDICAL HISTORY:   Past Medical History  Diagnosis Date  . Hyperlipidemia   . Hypertension   . Hypothyroid   . Hemorrhoids   . Skin cancer     basal cell, R ear, MOHS  . Hepatitis     self resolved, likely food exposure, 1974.   . Cataract     bilateral surgery to remove  . Constipation     PAST SURGICAL HISTORY: Past Surgical History  Procedure Laterality Date  . Tonsillectomy    . Vasectomy  1982  . Cataract extraction  1986    OD  . Cataract extraction Bilateral 1995    x 2 for right and left   . Lipoma excision  06/1997    left neck ([place])  . Mohs surgery Right 2013     behind right ear    SOCIAL HISTORY:  Social History  Substance Use Topics  . Smoking status: Former Smoker -- 2.00 packs/day for 20 years    Types: Cigarettes    Quit date: 08/07/1983  . Smokeless tobacco: Never Used     Comment: quit over 30 years ago  . Alcohol Use: 4.2 oz/week    7 Shots of liquor per week     Comment: a drink every evening, scotch or whiskey    FAMILY HISTORY:  Family History  Problem Relation Age of Onset  . Cancer Maternal Grandfather     lung, smoker  . Stroke Maternal Grandfather   . Heart disease Paternal Grandfather     MI, old age  . Stroke Mother   . Colon cancer Neg Hx   . Colon polyps Neg Hx   . Esophageal cancer Neg Hx   . Rectal cancer Neg Hx   . Stomach cancer Neg Hx     DRUG ALLERGIES:  Allergies  Allergen Reactions  . Sulfonamide Derivatives Rash    REACTION: UNKNOWN AN CHILD    REVIEW OF SYSTEMS:   CONSTITUTIONAL: No fever, fatigue or weakness.  EYES: No blurred or double vision.  EARS, NOSE, AND THROAT: No tinnitus or ear pain.  RESPIRATORY: No cough, shortness of breath, wheezing or hemoptysis.  CARDIOVASCULAR: No chest pain,  orthopnea, edema.  GASTROINTESTINAL: No nausea, vomiting, mild abdominal pain, has rectal bleed.  GENITOURINARY: No dysuria, hematuria.  ENDOCRINE: No polyuria, nocturia,  HEMATOLOGY: No anemia, easy bruising, bleeding per rectum noted SKIN: No rash or lesion. MUSCULOSKELETAL: No joint pain or arthritis.   NEUROLOGIC: No tingling, numbness, weakness.  PSYCHIATRY: No anxiety or depression.   MEDICATIONS AT HOME:  Prior to Admission medications   Medication Sig Start Date End Date Taking? Authorizing Provider  aspirin 81 MG tablet Take 81 mg by mouth daily.     Yes Historical Provider, MD  doxazosin (CARDURA) 4 MG tablet Take 1 tablet (4 mg total) by mouth daily. 08/23/15  Yes Tonia Ghent, MD  hydrocortisone (ANUSOL-HC) 2.5 % rectal cream Apply rectally 3  times daily as needed. 11/22/15  11/21/16 Yes Tonia Ghent, MD  ibuprofen (ADVIL,MOTRIN) 200 MG tablet Take 200 mg by mouth as needed.     Yes Historical Provider, MD  levothyroxine (SYNTHROID, LEVOTHROID) 150 MCG tablet TAKE 1 TABLET ONCE DAILY 08/23/15  Yes Tonia Ghent, MD  metoprolol succinate (TOPROL XL) 25 MG 24 hr tablet Take 1 tablet (25 mg total) by mouth daily. 08/23/15 08/26/16 Yes Tonia Ghent, MD  Multiple Vitamin (MULTIVITAMIN) tablet Take 1 tablet by mouth daily.     Yes Historical Provider, MD  Omega-3 Fatty Acids (FISH OIL) 1000 MG CAPS Take by mouth.   Yes Historical Provider, MD  psyllium (METAMUCIL) 58.6 % powder Take 1 packet by mouth daily.   Yes Historical Provider, MD  ramipril (ALTACE) 10 MG capsule Take 1 capsule (10 mg total) by mouth daily. 08/23/15  Yes Tonia Ghent, MD  simvastatin (ZOCOR) 40 MG tablet Take 1 tablet (40 mg total) by mouth at bedtime. 08/23/15  Yes Tonia Ghent, MD      PHYSICAL EXAMINATION:   VITAL SIGNS: Blood pressure 164/90, pulse 54, temperature 98.2 F (36.8 C), temperature source Oral, resp. rate 18, height 6\' 1"  (1.854 m), weight 115.667 kg (255 lb), SpO2 97 %.  GENERAL:  70 y.o.-year-old patient lying in the bed with no acute distress.  EYES: Pupils equal, round, reactive to light and accommodation. No scleral icterus. Extraocular muscles intact.  HEENT: Head atraumatic, normocephalic. Oropharynx and nasopharynx clear.  NECK:  Supple, no jugular venous distention. No thyroid enlargement, no tenderness.  LUNGS: Normal breath sounds bilaterally, no wheezing, rales,rhonchi or crepitation. No use of accessory muscles of respiration.  CARDIOVASCULAR: S1, S2 normal. No murmurs, rubs, or gallops.  ABDOMEN: Soft, mild tenderness around umbilicus, nondistended. Bowel sounds present. No organomegaly or mass.  EXTREMITIES: No pedal edema, cyanosis, or clubbing.  NEUROLOGIC: Cranial nerves II through XII are intact. Muscle strength 5/5 in all extremities. Sensation intact.  Gait normal PSYCHIATRIC: The patient is alert and oriented x 3.  SKIN: No obvious rash, lesion, or ulcer.   LABORATORY PANEL:   CBC  Recent Labs Lab 12/05/15 2139  WBC 11.2*  HGB 14.9  HCT 43.6  PLT 148*  MCV 88.0  MCH 30.2  MCHC 34.3  RDW 13.6   ------------------------------------------------------------------------------------------------------------------  Chemistries   Recent Labs Lab 12/05/15 2139  NA 141  K 3.7  CL 108  CO2 25  GLUCOSE 104*  BUN 15  CREATININE 1.40*  CALCIUM 9.2  AST 16  ALT 12*  ALKPHOS 55  BILITOT 1.0   ------------------------------------------------------------------------------------------------------------------ estimated creatinine clearance is 67.3 mL/min (by C-G formula based on Cr of 1.4). ------------------------------------------------------------------------------------------------------------------ No results for input(s): TSH, T4TOTAL, T3FREE, THYROIDAB in  the last 72 hours.  Invalid input(s): FREET3   Coagulation profile No results for input(s): INR, PROTIME in the last 168 hours. ------------------------------------------------------------------------------------------------------------------- No results for input(s): DDIMER in the last 72 hours. -------------------------------------------------------------------------------------------------------------------  Cardiac Enzymes No results for input(s): CKMB, TROPONINI, MYOGLOBIN in the last 168 hours.  Invalid input(s): CK ------------------------------------------------------------------------------------------------------------------ Invalid input(s): POCBNP  ---------------------------------------------------------------------------------------------------------------  Urinalysis No results found for: COLORURINE, APPEARANCEUR, LABSPEC, PHURINE, GLUCOSEU, HGBUR, BILIRUBINUR, KETONESUR, PROTEINUR, UROBILINOGEN, NITRITE, LEUKOCYTESUR   RADIOLOGY: No results  found.  EKG: No orders found for this or any previous visit.  IMPRESSION AND PLAN: 70 year old male patient with history of hypertension, hyperlipidemia, hypothyroidism, hemorrhoids presented to the emergency room with rectal bleeding. Patient had colonoscopy done yesterday at [facility] and polyps removed. Hemoglobin is stable around 14.9 after evaluation in the emergency room. Admitting diagnosis 1. Acute gastrointestinal bleeding which could be secondary to residual bleed after polypectomy 2. Hypertension 3. Hyperlipidemia 4. Hypothyroidism 5. Abdominal discomfort Treatment plan Admit patient to medical floor observation bed IV fluid hydration Hold aspirin and Motrin for now Serial hemoglobin monitoring every 6 hourly Gastroenterology consultation for rectal bleed IV Protonix 40 mg every 12 hourly DVT prophylaxis with sequential compression device to both lower extremities. Supportive care.  All the records are reviewed and case discussed with ED provider. Management plans discussed with the patient, family and they are in agreement.  CODE STATUS:FULL Code Status History    This patient does not have a recorded code status. Please follow your organizational policy for patients in this situation.    Advance Directive Documentation        Most Recent Value   Type of Advance Directive  Living will   Pre-existing out of facility DNR order (yellow form or pink MOST form)     "MOST" Form in Place?         TOTAL TIME TAKING CARE OF THIS PATIENT: 50 minutes.    Saundra Shelling M.D on 12/06/2015 at 12:48 AM  Between 7am to 6pm - Pager - [phone]  After 6pm go to [email or web address] - password [email or web address] [facility]  Office  [phone]  CC: Primary care physician; Elsie Stain, MD

## 2015-12-06 NOTE — Progress Notes (Signed)
12/06/2015 1:40 PM  BP 137/67 mmHg  Pulse 60  Temp(Src) 98.2 F (36.8 C) (Oral)  Resp 18  Ht 6\' 1"  (1.854 m)  Wt 115.577 kg (254 lb 12.8 oz)  BMI 33.62 kg/m2  SpO2 99% Patient discharged per MD orders. Discharge instructions reviewed with patient and patient verbalized understanding. IV removed per policy.  Discharged via wheelchair escorted by auxilary.  Almedia Balls, RN

## 2015-12-06 NOTE — Discharge Instructions (Signed)
Heart healthy diet. °Activity as tolerated. °

## 2015-12-06 NOTE — Discharge Summary (Signed)
[place] [facility] [place] NAME: James [name]    MR#:  [id number]  DATE OF BIRTH:  Jun 25, 1946  DATE OF ADMISSION:  12/05/2015 ADMITTING PHYSICIAN: Saundra Shelling, MD  DATE OF DISCHARGE: 12/06/2015 PRIMARY CARE PHYSICIAN: Elsie Stain, MD    ADMISSION DIAGNOSIS:  Gastrointestinal hemorrhage associated with anorectal source [K62.5]   DISCHARGE DIAGNOSIS:   Acute gastrointestinal bleeding, secondary to residual bleed after polypectomy  SECONDARY DIAGNOSIS:   Past Medical History  Diagnosis Date  . Hyperlipidemia   . Hypertension   . Hypothyroid   . Hemorrhoids   . Skin cancer     basal cell, R ear, MOHS  . Hepatitis     self resolved, likely food exposure, 1974.   . Cataract     bilateral surgery to remove  . Constipation     HOSPITAL COURSE:   Acute gastrointestinal bleeding which could be secondary to residual bleed after polypectomy. He has been treated with IV fluid support, Protonix IV twice a day. He has no active bleeding. Hemoglobin is stable at 14. Started diet this morning. Hold aspirin. Follow-up with his GI physician as outpatient.  DISCHARGE CONDITIONS:   Stable, discharge to home today.  CONSULTS OBTAINED:  Treatment Team:  Lollie Sails, MD  DRUG ALLERGIES:   Allergies  Allergen Reactions  . Sulfonamide Derivatives Rash    REACTION: UNKNOWN AN CHILD    DISCHARGE MEDICATIONS:   Current Discharge Medication List    CONTINUE these medications which have NOT CHANGED   Details  doxazosin (CARDURA) 4 MG tablet Take 1 tablet (4 mg total) by mouth daily. Qty: 90 tablet, Refills: 3    hydrocortisone (ANUSOL-HC) 2.5 % rectal cream Apply rectally 3  times daily as needed. Qty: 30 g, Refills: 6    levothyroxine (SYNTHROID, LEVOTHROID) 150 MCG tablet TAKE 1 TABLET ONCE DAILY Qty: 90 tablet, Refills: 3    metoprolol succinate (TOPROL XL) 25 MG 24 hr tablet Take 1 tablet (25 mg total) by mouth  daily. Qty: 90 tablet, Refills: 3    Multiple Vitamin (MULTIVITAMIN) tablet Take 1 tablet by mouth daily.      Omega-3 Fatty Acids (FISH OIL) 1000 MG CAPS Take by mouth.    psyllium (METAMUCIL) 58.6 % powder Take 1 packet by mouth daily.    ramipril (ALTACE) 10 MG capsule Take 1 capsule (10 mg total) by mouth daily. Qty: 90 capsule, Refills: 3    simvastatin (ZOCOR) 40 MG tablet Take 1 tablet (40 mg total) by mouth at bedtime. Qty: 90 tablet, Refills: 3      STOP taking these medications     aspirin 81 MG tablet      ibuprofen (ADVIL,MOTRIN) 200 MG tablet          DISCHARGE INSTRUCTIONS:   If you experience worsening of your admission symptoms, develop shortness of breath, life threatening emergency, suicidal or homicidal thoughts you must seek medical attention immediately by calling 911 or calling your MD immediately  if symptoms less severe.  You Must read complete instructions/literature along with all the possible adverse reactions/side effects for all the Medicines you take and that have been prescribed to you. Take any new Medicines after you have completely understood and accept all the possible adverse reactions/side effects.   Please note  You were cared for by a hospitalist during your hospital stay. If you have any questions about your discharge medications or the care you received while you were in the  hospital after you are discharged, you can call the unit and asked to speak with the hospitalist on call if the hospitalist that took care of you is not available. Once you are discharged, your primary care physician will handle any further medical issues. Please note that NO REFILLS for any discharge medications will be authorized once you are discharged, as it is imperative that you return to your primary care physician (or establish a relationship with a primary care physician if you do not have one) for your aftercare needs so that they can reassess your need for  medications and monitor your lab values.    Today   SUBJECTIVE   No complaint.   VITAL SIGNS:  Blood pressure 152/76, pulse 53, temperature 97.6 F (36.4 C), temperature source Oral, resp. rate 18, height 6\' 1"  (1.854 m), weight 115.577 kg (254 lb 12.8 oz), SpO2 98 %.  I/O:   Intake/Output Summary (Last 24 hours) at 12/06/15 1114 Last data filed at 12/06/15 0513  Gross per 24 hour  Intake    381 ml  Output    200 ml  Net    181 ml    PHYSICAL EXAMINATION:  GENERAL:  70 y.o.-year-old patient lying in the bed with no acute distress. Morbidly obese. EYES: Pupils equal, round, reactive to light and accommodation. No scleral icterus. Extraocular muscles intact.  HEENT: Head atraumatic, normocephalic. Oropharynx and nasopharynx clear.  NECK:  Supple, no jugular venous distention. No thyroid enlargement, no tenderness.  LUNGS: Normal breath sounds bilaterally, no wheezing, rales,rhonchi or crepitation. No use of accessory muscles of respiration.  CARDIOVASCULAR: S1, S2 normal. No murmurs, rubs, or gallops.  ABDOMEN: Soft, non-tender, non-distended. Bowel sounds present. No organomegaly or mass.  EXTREMITIES: No pedal edema, cyanosis, or clubbing.  NEUROLOGIC: Cranial nerves II through XII are intact. Muscle strength 5/5 in all extremities. Sensation intact. Gait not checked.  PSYCHIATRIC: The patient is alert and oriented x 3.  SKIN: No obvious rash, lesion, or ulcer.   DATA REVIEW:   CBC  Recent Labs Lab 12/05/15 2139  12/06/15 0645  WBC 11.2*  --   --   HGB 14.9  < > 14.0  HCT 43.6  --   --   PLT 148*  --   --   < > = values in this interval not displayed.  Chemistries   Recent Labs Lab 12/05/15 2139 12/06/15 0645  NA 141 142  K 3.7 4.6  CL 108 111  CO2 25 26  GLUCOSE 104* 92  BUN 15 16  CREATININE 1.40* 1.26*  CALCIUM 9.2 8.9  AST 16  --   ALT 12*  --   ALKPHOS 55  --   BILITOT 1.0  --     Cardiac Enzymes No results for input(s): TROPONINI in the  last 168 hours.  Microbiology Results  Results for orders placed or performed in visit on 07/26/15  Fecal occult blood, imunochemical     Status: None   Collection Time: 07/26/15  4:33 PM  Result Value Ref Range Status   Fecal Occult Bld Negative Negative Final    RADIOLOGY:  No results found.      Management plans discussed with the patient, His wife and they are in agreement.  CODE STATUS:     Code Status Orders        Start     Ordered   12/06/15 0137  Full code   Continuous     12/06/15 0136    Code  Status History    Date Active Date Inactive Code Status Order ID Comments User Context   This patient has a current code status but no historical code status.    Advance Directive Documentation        Most Recent Value   Type of Advance Directive  Living will   Pre-existing out of facility DNR order (yellow form or pink MOST form)     "MOST" Form in Place?        TOTAL TIME TAKING CARE OF THIS PATIENT:27 minutes.    Demetrios Loll M.D on 12/06/2015 at 11:14 AM  Between 7am to 6pm - Pager - [phone]  After 6pm go to [email or web address] - password [email or web address] [facility]  Office  [phone]  CC: Primary care physician; Elsie Stain, MD

## 2015-12-09 ENCOUNTER — Encounter: Payer: Self-pay | Admitting: Gastroenterology

## 2015-12-12 ENCOUNTER — Ambulatory Visit (INDEPENDENT_AMBULATORY_CARE_PROVIDER_SITE_OTHER): Payer: Self-pay | Admitting: Internal Medicine

## 2015-12-12 ENCOUNTER — Encounter: Payer: Self-pay | Admitting: Internal Medicine

## 2015-12-12 VITALS — BP 102/70 | HR 76 | Ht 71.5 in | Wt 260.2 lb

## 2015-12-12 DIAGNOSIS — K922 Gastrointestinal hemorrhage, unspecified: Secondary | ICD-10-CM

## 2015-12-12 DIAGNOSIS — D123 Benign neoplasm of transverse colon: Secondary | ICD-10-CM

## 2015-12-12 HISTORY — DX: Gastrointestinal hemorrhage, unspecified: K92.2

## 2015-12-12 NOTE — Progress Notes (Signed)
   Subjective:    Patient ID: James [name], male    DOB: 03-09-1946, 70 y.o.   MRN: [id number] Cc: f/u GI bleeding after colonoscopy HPI Doing well no further bleeding since his brief hospitalization. The patient had been hospitalized overnight at [facility] the day of his colonoscopy. He has some hematochezia and maroon stools, hemoglobin never became abnormal. Sxs lasted < 24 hrs. 6 polyps removed largest 25 mm in the transverse colon. He also had left sided diverticulosis. He is having a little mucus when he  wipes his rectum after defecation was C thinks is a new issue.  Medications, allergies, past medical history, past surgical history, family history and social history are reviewed and updated in the EMR.   Review of Systems As above    Objective:   Physical Exam BP 102/70 mmHg  Pulse 76  Ht 5' 11.5" (1.816 m)  Wt 260 lb 4 oz (118.049 kg)  BMI 35.80 kg/m2   CBC Latest Ref Rng 12/06/2015 12/06/2015 12/05/2015  WBC 3.8 - 10.6 K/uL - - 11.2(H)  Hemoglobin 13.0 - 18.0 g/dL 14.0 13.8 14.9  Hematocrit 40.0 - 52.0 % - - 43.6  Platelets 150 - 440 K/uL - - 148(L)      Assessment & Plan:   1. Benign neoplasm of transverse colon   2. Acute lower GI bleeding after colonoscopy and polypectomy, resolved     His bleeding is resolved, presumed post-polypectomy bleeding though he could've had a transient diverticular bleed, half seen that in the past after colonoscopy also. His hemoglobin never became abnormal. He is reassured today. He needs a repeat colonoscopy later this year in about 6 months to make sure the 2.5 cm tubular adenoma removed from the transverse colon (likely source of bleed) is completely gone. I explained the risks benefits and indications and the rationale for that and he understands and agrees to proceed later this year.  He is not charged for today's visit. I appreciate the opportunity to care for this patient. CC: Elsie Stain, MD

## 2015-12-12 NOTE — Patient Instructions (Signed)
   We are glad the bleeding is over and we will put you in the system for an October colon recall.     I appreciate the opportunity to care for you.

## 2015-12-15 ENCOUNTER — Encounter: Payer: Self-pay | Admitting: Internal Medicine

## 2015-12-15 NOTE — Progress Notes (Signed)
Quick Note:  5 adenomas one large - recall 05/2016 already entered at office visit No letter ______

## 2015-12-20 ENCOUNTER — Encounter: Payer: Self-pay | Admitting: Family Medicine

## 2015-12-20 ENCOUNTER — Ambulatory Visit (INDEPENDENT_AMBULATORY_CARE_PROVIDER_SITE_OTHER): Payer: Medicare Other | Admitting: Family Medicine

## 2015-12-20 VITALS — BP 134/78 | HR 48 | Temp 97.5°F | Wt 262.5 lb

## 2015-12-20 DIAGNOSIS — Z86010 Personal history of colonic polyps: Secondary | ICD-10-CM | POA: Diagnosis not present

## 2015-12-20 NOTE — Progress Notes (Signed)
Pre visit review using our clinic review tool, if applicable. No additional management support is needed unless otherwise documented below in the visit note.  Admitted after colonoscopy with prev polypectomy with rectal bleeding.  HGB stable prev, bleeding stopped and patient was discharged.  Here today to discuss.  D/w pt about path results (not CA), rationale for f/u.  No blood in stool, no black stools.  Prev mucous in stool resolved.  Back on ASA.  No bleeding o/w.    Meds, vitals, and allergies reviewed.   ROS: Per HPI unless specifically indicated in ROS section   GEN: nad, alert and oriented HEENT: mucous membranes moist NECK: supple w/o LA CV: rrr. PULM: ctab, no inc wob ABD: soft, +bs

## 2015-12-20 NOTE — Patient Instructions (Signed)
Take care.  Glad to see you. Update me as needed.  

## 2015-12-21 NOTE — Assessment & Plan Note (Signed)
D/w pt about path results (not CA), rationale for f/u. No blood in stool, no black stools. Prev mucous in stool resolved. Back on ASA. No bleeding o/w.  Would still need f/u colonoscopy (images reviewed with patient at [facility]).  Would hold ASA before the repeat testing.  He agrees.   Update me as needed.  App help of all involved.  >15 minutes spent in face to face time with patient, >50% spent in counselling or coordination of care

## 2016-03-06 ENCOUNTER — Encounter: Payer: Self-pay | Admitting: Family Medicine

## 2016-03-06 ENCOUNTER — Ambulatory Visit (INDEPENDENT_AMBULATORY_CARE_PROVIDER_SITE_OTHER): Payer: Medicare Other | Admitting: Family Medicine

## 2016-03-06 ENCOUNTER — Telehealth: Payer: Self-pay | Admitting: Family Medicine

## 2016-03-06 DIAGNOSIS — R319 Hematuria, unspecified: Secondary | ICD-10-CM | POA: Insufficient documentation

## 2016-03-06 LAB — URINALYSIS, MICROSCOPIC ONLY: WBC UA: NONE SEEN (ref 0–?)

## 2016-03-06 LAB — POC URINALSYSI DIPSTICK (AUTOMATED)
Bilirubin, UA: NEGATIVE
GLUCOSE UA: NEGATIVE
LEUKOCYTES UA: NEGATIVE
NITRITE UA: NEGATIVE
Protein, UA: NEGATIVE
Spec Grav, UA: 1.01
UROBILINOGEN UA: 0.2
pH, UA: 6

## 2016-03-06 NOTE — Addendum Note (Signed)
Addended by: Ellamae Sia on: 03/06/2016 03:45 PM   Modules accepted: Orders

## 2016-03-06 NOTE — Progress Notes (Signed)
Pre visit review using our clinic review tool, if applicable. No additional management support is needed unless otherwise documented below in the visit note. 

## 2016-03-06 NOTE — Assessment & Plan Note (Signed)
New- UA pos for RBC, otherwise neg. Infection less likely but will send for urine cx to be sure. Urine micro to quantify RBC. Also cannot rule out nephrolithiasis/malignancy. Renal stone CT ordered. If neg and hematuria persists, refer to urology for cystoscopy to rule out malignancy. The patient indicates understanding of these issues and agrees with the plan.

## 2016-03-06 NOTE — Progress Notes (Signed)
Subjective:   Patient ID: James [name], male    DOB: [birth year], 70 y.o.   MRN: [id number]  James [name] is a pleasant 70 y.o. year old male pt of Dr. Damita Dunnings, new to me, who presents to clinic today with Hematuria  on 03/06/2016  HPI:  Onset of gross, painless hematuria last night.  He is having some back pain but this started at least a week ago.  Has been sitting more, doing more work on his computer.  No dysuria.  No change in urinary frequency- does have BPH.  No known h/o nephrolithiasis but his brother has suffered from multiple bouts of kidney stones.  No fever, nausea or vomiting.  Current Outpatient Prescriptions on File Prior to Visit  Medication Sig Dispense Refill  . aspirin 81 MG tablet Take 81 mg by mouth daily.    Marland Kitchen doxazosin (CARDURA) 4 MG tablet Take 1 tablet (4 mg total) by mouth daily. 90 tablet 3  . hydrocortisone (ANUSOL-HC) 2.5 % rectal cream Apply rectally 3  times daily as needed. 30 g 6  . levothyroxine (SYNTHROID, LEVOTHROID) 150 MCG tablet TAKE 1 TABLET ONCE DAILY 90 tablet 3  . metoprolol succinate (TOPROL XL) 25 MG 24 hr tablet Take 1 tablet (25 mg total) by mouth daily. 90 tablet 3  . Multiple Vitamin (MULTIVITAMIN) tablet Take 1 tablet by mouth daily.      . Omega-3 Fatty Acids (FISH OIL) 1000 MG CAPS Take by mouth.    . psyllium (METAMUCIL) 58.6 % powder Take 1 packet by mouth daily.    . ramipril (ALTACE) 10 MG capsule Take 1 capsule (10 mg total) by mouth daily. 90 capsule 3  . simvastatin (ZOCOR) 40 MG tablet Take 1 tablet (40 mg total) by mouth at bedtime. 90 tablet 3   No current facility-administered medications on file prior to visit.     Allergies  Allergen Reactions  . Sulfonamide Derivatives Rash    REACTION: UNKNOWN AN CHILD    Past Medical History:  Diagnosis Date  . Acute lower GI bleeding after colonoscopy and polypectomy, resolved 12/12/2015  . Cataract    bilateral surgery to remove  . Colon polyp   . Constipation   .  Hemorrhoids   . Hepatitis    self resolved, likely food exposure, 1974.   Marland Kitchen Hx of adenomatous colonic polyps 12/05/2015  . Hyperlipidemia   . Hypertension   . Hypothyroidism   . Skin cancer    basal cell, R ear, MOHS    Past Surgical History:  Procedure Laterality Date  . CATARACT EXTRACTION  1986   OD  . CATARACT EXTRACTION Bilateral 1995   x 2 for right and left   . COLONOSCOPY    . LIPOMA EXCISION  06/1997   left neck (Juengel)  . MOHS SURGERY Right 2013   behind right ear  . TONSILLECTOMY    . VASECTOMY  1982    Family History  Problem Relation Age of Onset  . Lung cancer Maternal Grandfather     smoker  . Stroke Maternal Grandfather   . Heart disease Paternal Grandfather     MI, old age  . Stroke Mother   . Colon cancer Neg Hx   . Colon polyps Neg Hx   . Esophageal cancer Neg Hx   . Rectal cancer Neg Hx   . Stomach cancer Neg Hx     Social History   Social History  . Marital status: Married  Spouse name: N/A  . Number of children: 1  . Years of education: N/A   Occupational History  . Retired Retired    1   Social History Main Topics  . Smoking status: Former Smoker    Packs/day: 2.00    Years: 20.00    Types: Cigarettes    Quit date: 08/07/1983  . Smokeless tobacco: Never Used     Comment: quit over 30 years ago  . Alcohol use 4.2 oz/week    7 Shots of liquor per week     Comment: a drink every evening, scotch or whiskey  . Drug use: No  . Sexual activity: Not on file   Other Topics Concern  . Not on file   Social History Narrative   Married and lives with wife 1974   One daughter, not local   Retired from [employer], sea based telecommunication/contracting   The PMH, [place], Social History, Family History, Medications, and allergies have been reviewed in [facility], and have been updated if relevant.   Review of Systems  Constitutional: Negative.   HENT: Negative.   Cardiovascular: Negative.   Gastrointestinal: Negative.   Genitourinary:  Positive for flank pain and hematuria. Negative for decreased urine volume, frequency, penile swelling, testicular pain and urgency.  Musculoskeletal: Positive for back pain.  Neurological: Negative.   Hematological: Negative.   Psychiatric/Behavioral: Negative.   All other systems reviewed and are negative.      Objective:    BP (!) 160/90   Pulse 62   Temp 98.1 F (36.7 C) (Oral)   Wt 266 lb 8 oz (120.9 kg)   SpO2 97%   BMI 36.65 kg/m    Physical Exam  Constitutional: He is oriented to person, place, and time. He appears well-developed and well-nourished. No distress.  HENT:  Head: Normocephalic.  Eyes: Conjunctivae are normal.  Cardiovascular: Normal rate.   Pulmonary/Chest: Effort normal.  Abdominal: Soft. He exhibits no distension. There is no tenderness. There is no rebound and no guarding.  Musculoskeletal: Normal range of motion.  Neurological: He is alert and oriented to person, place, and time. No cranial nerve deficit.  Skin: Skin is warm and dry. He is not diaphoretic.  Psychiatric: He has a normal mood and affect. His behavior is normal. Judgment and thought content normal.  Nursing note and vitals reviewed.         Assessment & Plan:   Hematuria No Follow-up on file.

## 2016-03-06 NOTE — Telephone Encounter (Signed)
Patient Name: James [name]  DOB: 1946/04/28    Initial Comment caller states he has blood in his urine   Nurse Assessment  Nurse: Raphael Gibney, RN, Vanita Ingles Date/Time (Eastern Time): 03/06/2016 7:14:12 AM  Confirm and document reason for call. If symptomatic, describe symptoms. You must click the next button to save text entered. ---Caller states he passes a little blood after he urinates. Slight burning when he urinates. No fever. Has a little back pain which he has occasaionally.  Has the patient traveled out of the country within the last 30 days? ---Not Applicable  Does the patient have any new or worsening symptoms? ---Yes  Will a triage be completed? ---Yes  Related visit to physician within the last 2 weeks? ---No  Does the PT have any chronic conditions? (i.e. diabetes, asthma, etc.) ---Yes  List chronic conditions. ---HTN  Is this a behavioral health or substance abuse call? ---No     Guidelines    Guideline Title Affirmed Question Affirmed Notes  Urine - Blood In Pain or burning with passing urine    Final Disposition User   See Physician within 24 Hours Hayden, RN, Vera    Comments  appt scheduled for 12 noon 03/06/16 with Dr. Arnette Norris   Referrals  REFERRED TO PCP OFFICE   Disagree/Comply: Comply

## 2016-03-06 NOTE — Telephone Encounter (Signed)
Pt has appt with Dr Deborra Medina 03/06/16 12 noon/

## 2016-03-06 NOTE — Patient Instructions (Signed)
Nice to meet you. Please stop by to see Rosaria Ferries or Ebony Hail on your way out.

## 2016-03-08 ENCOUNTER — Ambulatory Visit
Admission: RE | Admit: 2016-03-08 | Discharge: 2016-03-08 | Disposition: A | Discharge status: Home / Self Care | Payer: Medicare Other | Source: Ambulatory Visit | Attending: Family Medicine | Admitting: Family Medicine

## 2016-03-08 DIAGNOSIS — R310 Gross hematuria: Secondary | ICD-10-CM | POA: Diagnosis not present

## 2016-03-08 DIAGNOSIS — I700 Atherosclerosis of aorta: Secondary | ICD-10-CM | POA: Insufficient documentation

## 2016-03-08 DIAGNOSIS — K5730 Diverticulosis of large intestine without perforation or abscess without bleeding: Secondary | ICD-10-CM | POA: Insufficient documentation

## 2016-03-08 DIAGNOSIS — R935 Abnormal findings on diagnostic imaging of other abdominal regions, including retroperitoneum: Secondary | ICD-10-CM | POA: Diagnosis not present

## 2016-03-08 DIAGNOSIS — R319 Hematuria, unspecified: Secondary | ICD-10-CM

## 2016-03-08 LAB — URINE CULTURE: ORGANISM ID, BACTERIA: NO GROWTH

## 2016-03-08 IMAGING — CT CT RENAL STONE PROTOCOL
1 of 2 series · 15 of 32 positions shown, 19 images · non-contrast
Comparison: None.

CLINICAL DATA: History of painless gross hematuria for several
days, no history of kidney stone

EXAM:
CT ABDOMEN AND PELVIS WITHOUT CONTRAST
TECHNIQUE: Multidetector CT imaging of the abdomen and pelvis was performed
following the standard protocol without IV contrast.

[Series 2: axial st · axial · 0.90mm/px · z∈[-1070,-610]mm · 15 of 102 slices shown, 19 images]
[im 5/102  soft-tissue]
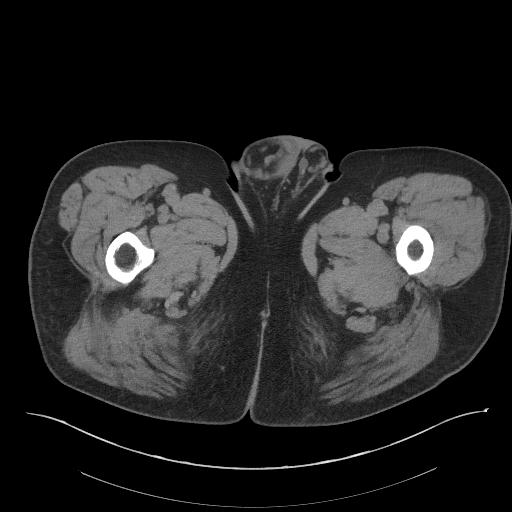
[im 5/102  bone]
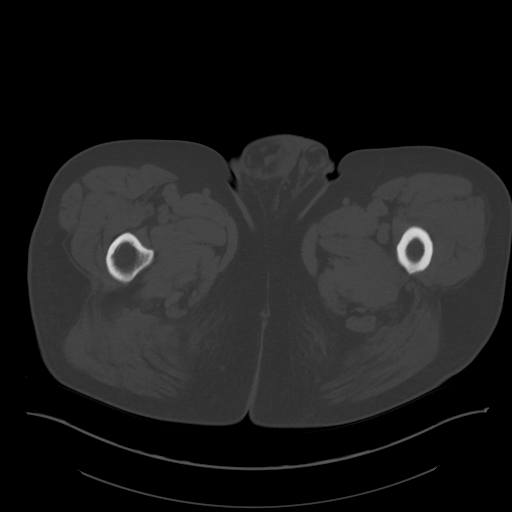
[im 13/102  soft-tissue]
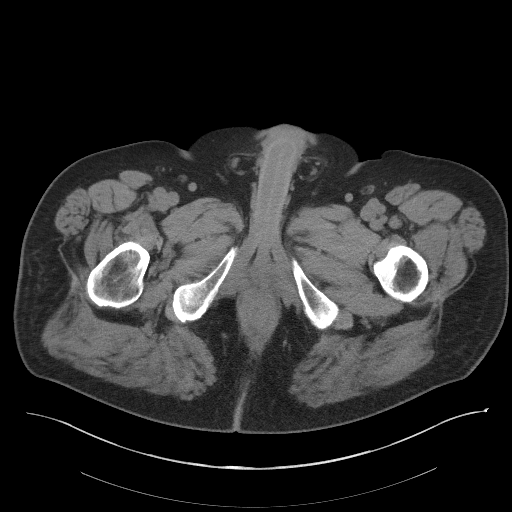
[im 21/102  soft-tissue]
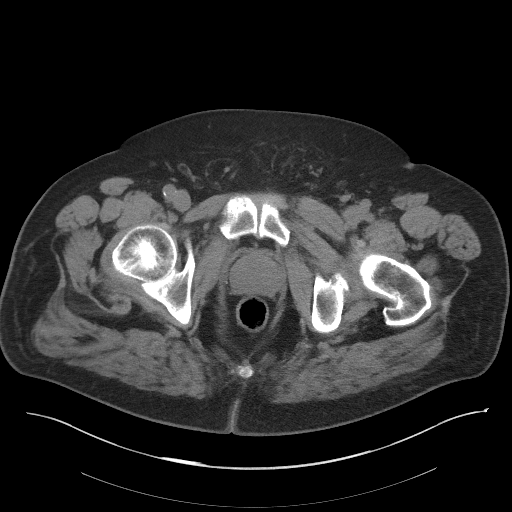
[im 29/102  soft-tissue]
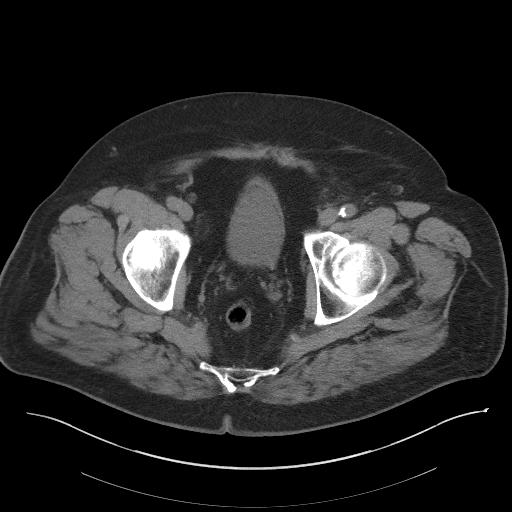
[im 37/102  soft-tissue]
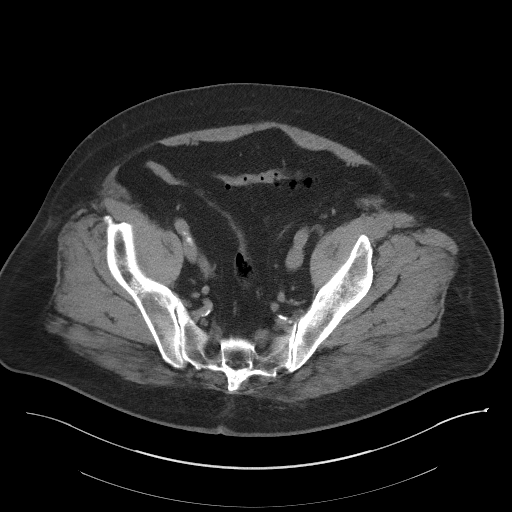
[im 45/102  soft-tissue]
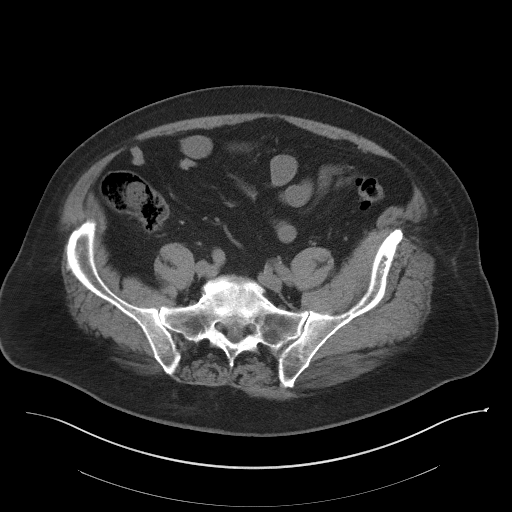
[im 53/102  soft-tissue]
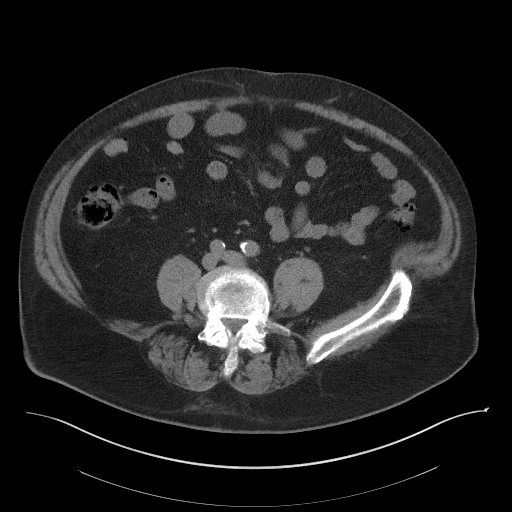
[im 57/102  soft-tissue]
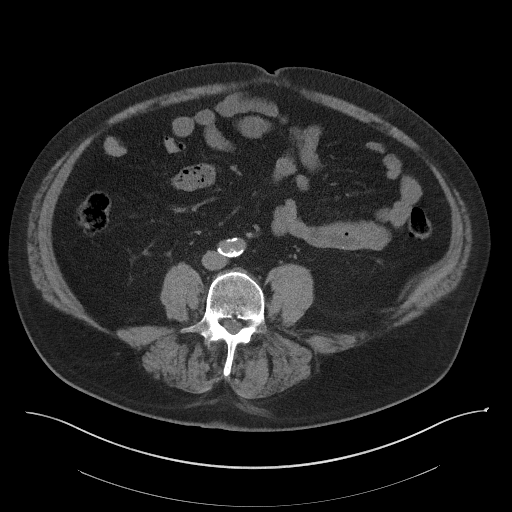
[im 65/102  soft-tissue]
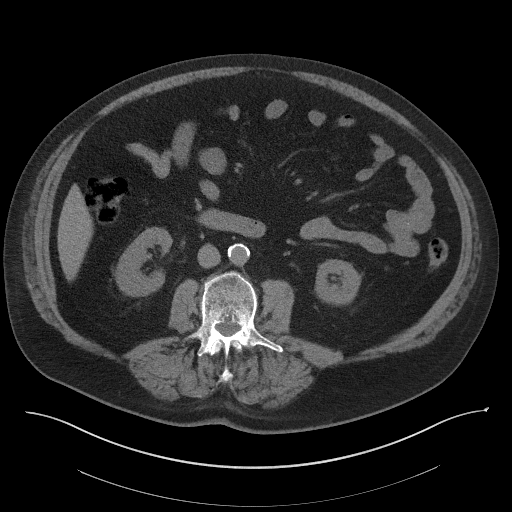
[im 65/102  bone]
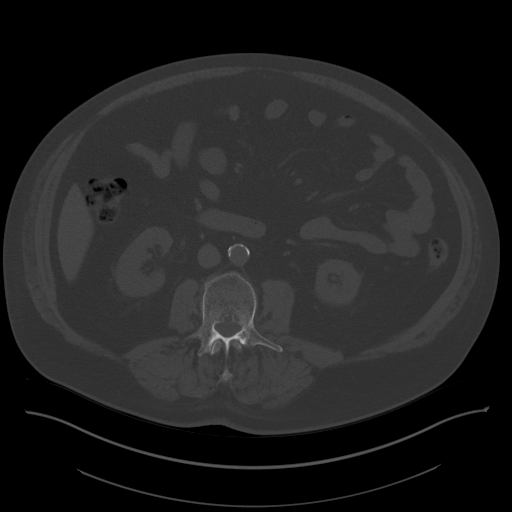
[im 73/102  soft-tissue]
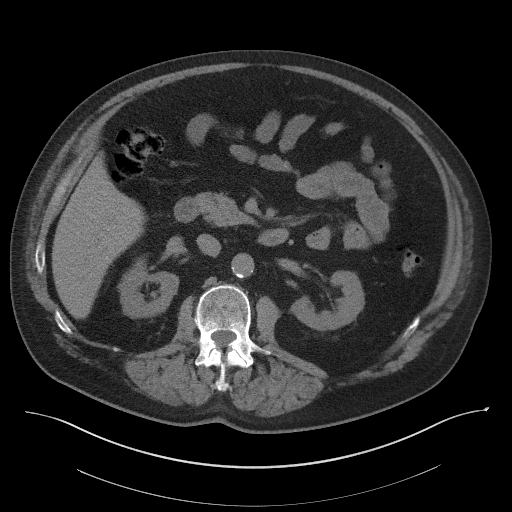
[im 81/102  soft-tissue]
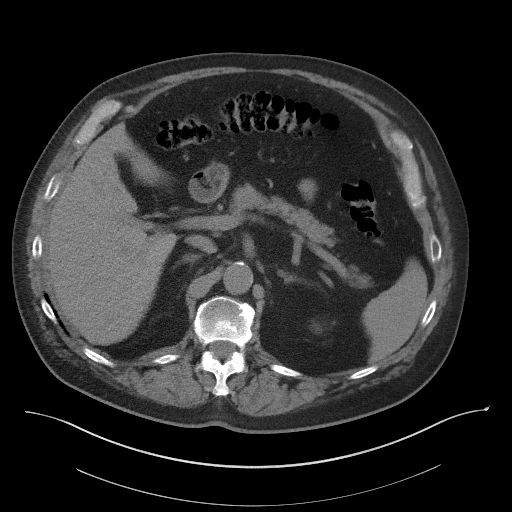
[im 85/102  lung]
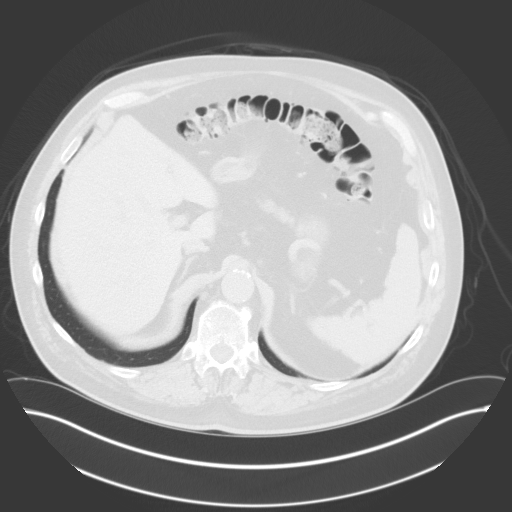
[im 89/102  soft-tissue]
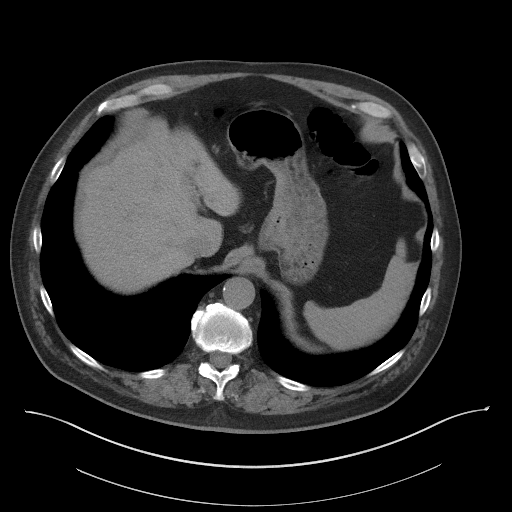
[im 89/102  lung]
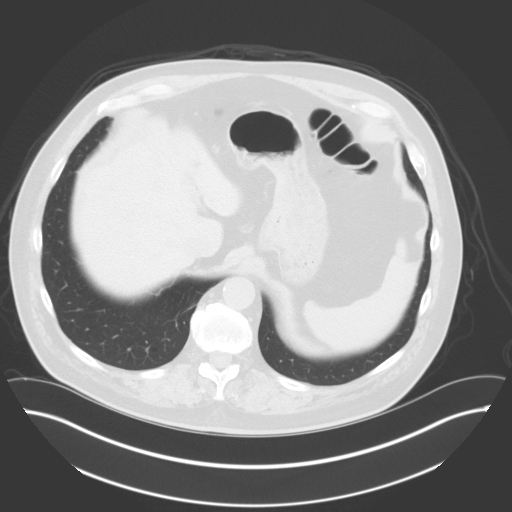
[im 93/102  lung]
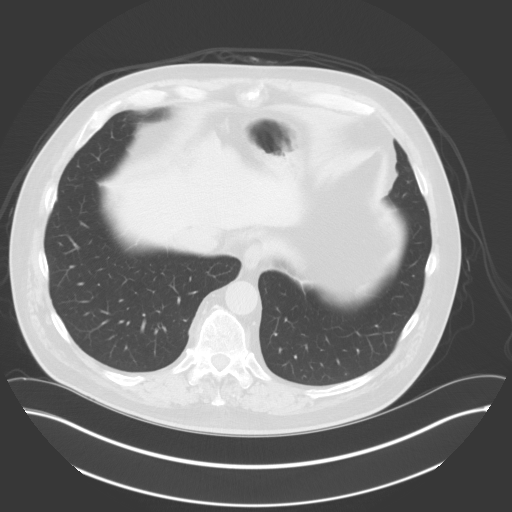
[im 97/102  soft-tissue]
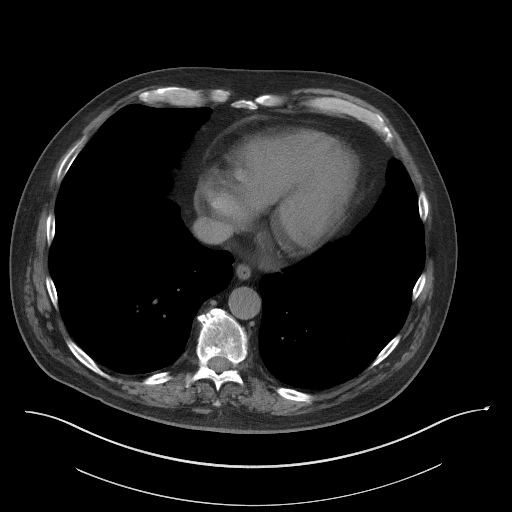
[im 97/102  lung]
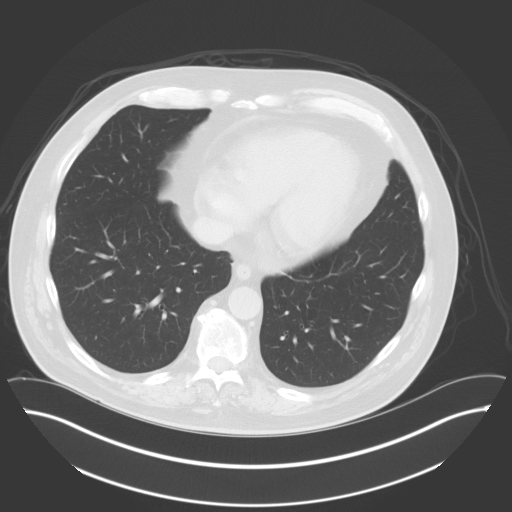

[15 of 32 positions shown; findings below may reference images not displayed]

FINDINGS: The lung bases are clear. The heart is borderline enlarged. No
pericardial effusion is seen. There is faint calcification of the
left anterior descending and right coronary arteries. The liver is
unremarkable in the unenhanced state. No calcified gallstones are
seen. The pancreas is normal in size and the pancreatic duct is not
dilated. The adrenal glands and spleen are unremarkable. The stomach
is decompressed.

No renal calculi are seen on this unenhanced study. A small
exophytic structure emanates from the upper pole of the left kidney
posterior medially probably a small cyst but difficult to assess on
this unenhanced study. The abdominal aorta is normal in caliber with
moderate atherosclerotic change present.

The distal ureters are normal in caliber and no distal ureteral
calculus or mass is seen. The urinary bladder unfortunately is not
well distended but no intraluminal abnormality is evident. The
prostate is within normal limits in size. There are rectosigmoid
colon diverticula present but no diverticulitis is seen the terminal
ileum is unremarkable. The appendix is not visualized. Has the
appendix been resected previously? The lumbar vertebrae are normal
alignment. There is degenerative disc disease most marked at L4-5
and L5-S1. Abdominal aortic atherosclerotic change is present.
IMPRESSION: 1. No explanation for the patient's gross hematuria is seen. No
renal calculi are noted and there is no evidence of hydronephrosis
or hydroureter.
2. The urinary bladder is unfortunately not well distended but no
intraluminal bladder lesion is seen.
3. Moderate abdominal aortic atherosclerosis.
4. Multiple rectosigmoid colon diverticula.

## 2016-03-21 ENCOUNTER — Ambulatory Visit (INDEPENDENT_AMBULATORY_CARE_PROVIDER_SITE_OTHER): Payer: Medicare Other | Admitting: Family Medicine

## 2016-03-21 ENCOUNTER — Encounter: Payer: Self-pay | Admitting: Family Medicine

## 2016-03-21 VITALS — BP 148/84 | HR 55 | Temp 98.6°F | Ht 71.5 in | Wt 263.8 lb

## 2016-03-21 DIAGNOSIS — R319 Hematuria, unspecified: Secondary | ICD-10-CM

## 2016-03-21 DIAGNOSIS — R399 Unspecified symptoms and signs involving the genitourinary system: Secondary | ICD-10-CM | POA: Diagnosis not present

## 2016-03-21 MED ORDER — DOXAZOSIN MESYLATE 4 MG PO TABS: 4.0000 mg | ORAL_TABLET | Freq: Every day | ORAL | Status: DC

## 2016-03-21 NOTE — Progress Notes (Signed)
He was seen prev with hematuria.  CT stone protocol done.  D/w pt:   1. No explanation for the patient's gross hematuria is seen. No renal calculi are noted and there is no evidence of hydronephrosis or hydroureter. 2. The urinary bladder is unfortunately not well distended but no intraluminal bladder lesion is seen. 3. Moderate abdominal aortic atherosclerosis. 4. Multiple rectosigmoid colon diverticula.  Now with lower abd bloating and lower back pain, slightly worse in the last 2 weeks.  No burning with urination.  Stream is slower.  No blood seen in urine in the meantime.  No FCNAVD.  No FH prostate cancer.  On doxazosin.    PMH and SH reviewed  ROS: Per HPI unless specifically indicated in ROS section   Meds, vitals, and allergies reviewed.   GEN: nad, alert and oriented HEENT: mucous membranes moist NECK: supple w/o LA CV: rrr. PULM: ctab, no inc wob ABD: soft, +bs EXT: no edema SKIN: no acute rash Prostate gland firm and smooth, no enlargement, nodularity, tenderness, mass, asymmetry or induration.

## 2016-03-21 NOTE — Patient Instructions (Signed)
Go to the lab on the way out.  We'll contact you with your lab report. Rosaria Ferries will call about your referral. Take care.  Glad to see you.  Take 1.5-2 doxazosin in the meantime and see if that helps.

## 2016-03-22 ENCOUNTER — Encounter: Payer: Self-pay | Admitting: Family Medicine

## 2016-03-22 LAB — PSA, MEDICARE: PSA: 2.88 ng/mL (ref 0.10–4.00)

## 2016-03-22 NOTE — Assessment & Plan Note (Signed)
Presumed that he has some BPH with urethral narrowing, with presumed source of prev blood in urine from prostatic urethra.  With dec bladder emptying, that could explain some of the abd bloating and abd sx.  D/w pt.  Check PSA, take 1.5-2 doxazosin in the meantime to see if that helps with urination with BP caution.  He agrees.  Refer to uro.  Okay for outpatient f/u.

## 2016-04-04 ENCOUNTER — Ambulatory Visit (INDEPENDENT_AMBULATORY_CARE_PROVIDER_SITE_OTHER): Payer: Medicare Other | Admitting: Urology

## 2016-04-04 ENCOUNTER — Encounter: Payer: Self-pay | Admitting: Urology

## 2016-04-04 VITALS — BP 160/86 | HR 59 | Ht 71.5 in | Wt 264.7 lb

## 2016-04-04 DIAGNOSIS — R310 Gross hematuria: Secondary | ICD-10-CM | POA: Diagnosis not present

## 2016-04-04 DIAGNOSIS — R350 Frequency of micturition: Secondary | ICD-10-CM | POA: Diagnosis not present

## 2016-04-04 DIAGNOSIS — R399 Unspecified symptoms and signs involving the genitourinary system: Secondary | ICD-10-CM

## 2016-04-04 LAB — MICROSCOPIC EXAMINATION: Bacteria, UA: NONE SEEN

## 2016-04-04 LAB — URINALYSIS, COMPLETE
BILIRUBIN UA: NEGATIVE
GLUCOSE, UA: NEGATIVE
KETONES UA: NEGATIVE
Leukocytes, UA: NEGATIVE
Nitrite, UA: NEGATIVE
PROTEIN UA: NEGATIVE
Specific Gravity, UA: 1.01 (ref 1.005–1.030)
UUROB: 0.2 mg/dL (ref 0.2–1.0)
pH, UA: 6.5 (ref 5.0–7.5)

## 2016-04-04 NOTE — Progress Notes (Signed)
04/04/2016 3:11 PM   James [name] 06-26-1946 [id number]  Referring provider: Tonia Ghent, MD [address] [place], [place] [address]  Chief Complaint  Patient presents with  . New Patient (Initial Visit)    HPI: 1) Gross hematuria - Pt complain of gross hematuria about 2 months ago. He had red urine, no clots. Also saw red in his underwear. This was after a c-scope where he was admitted for post-op bleeding. His gross hematuria resolved. His UA was clear when he saw his PCP. CT non-con A/P was normal - I reviewed the images.   2) BPH with LUTS - pt was having frequency, nocturia, weak stream. He is on doxazosin for HTN and this was increased from 4 - 6 mg and then back to 4 mg. Recent PSA 2.88 with a PSAV of 0.56 / year. DRE was normal. Prostate looks normal on CT - measured ~ 40 grams. His symptoms improved. He has no h/o kidney stones, UTI or urethral stricture.   Today, [name] is seen for the above. His symptoms have improved. He has nocturia x 2 which is chronic.    PMH: Past Medical History:  Diagnosis Date  . Acute lower GI bleeding after colonoscopy and polypectomy, resolved 12/12/2015  . Cataract    bilateral surgery to remove  . Colon polyp   . Constipation   . Hemorrhoids   . Hepatitis    self resolved, likely food exposure, 1974.   Marland Kitchen Hx of adenomatous colonic polyps 12/05/2015  . Hyperlipidemia   . Hypertension   . Hypothyroidism   . Skin cancer    basal cell, R ear, MOHS    Surgical History: Past Surgical History:  Procedure Laterality Date  . CATARACT EXTRACTION  1986   OD  . CATARACT EXTRACTION Bilateral 1995   x 2 for right and left   . COLONOSCOPY    . LIPOMA EXCISION  06/1997   left neck (Juengel)  . MOHS SURGERY Right 2013   behind right ear  . TONSILLECTOMY    . VASECTOMY  1982    Home Medications:    Medication List       Accurate as of 04/04/16  3:11 PM. Always use your most recent med list.          aspirin 81 MG  tablet Take 81 mg by mouth daily.   doxazosin 4 MG tablet Commonly known as:  CARDURA Take 1-2 tablets (4-8 mg total) by mouth daily.   Fish Oil 1000 MG Caps Take by mouth.   hydrocortisone 2.5 % rectal cream Commonly known as:  ANUSOL-HC Apply rectally 3  times daily as needed.   levothyroxine 150 MCG tablet Commonly known as:  SYNTHROID, LEVOTHROID TAKE 1 TABLET ONCE DAILY   metoprolol succinate 25 MG 24 hr tablet Commonly known as:  TOPROL XL Take 1 tablet (25 mg total) by mouth daily.   multivitamin tablet Take 1 tablet by mouth daily.   psyllium 58.6 % powder Commonly known as:  METAMUCIL Take 1 packet by mouth daily.   ramipril 10 MG capsule Commonly known as:  ALTACE Take 1 capsule (10 mg total) by mouth daily.   simvastatin 40 MG tablet Commonly known as:  ZOCOR Take 1 tablet (40 mg total) by mouth at bedtime.       Allergies:  Allergies  Allergen Reactions  . Sulfonamide Derivatives Rash    REACTION: UNKNOWN AN CHILD    Family History: Family History  Problem Relation Age of Onset  . Heart disease Paternal Grandfather     MI, old age  . Stroke Mother   . Lung cancer Maternal Grandfather     smoker  . Stroke Maternal Grandfather   . Colon cancer Neg Hx   . Colon polyps Neg Hx   . Esophageal cancer Neg Hx   . Rectal cancer Neg Hx   . Stomach cancer Neg Hx   . Bladder Cancer Neg Hx   . Prostate cancer Neg Hx     Social History:  reports that he quit smoking about 32 years ago. His smoking use included Cigarettes. He has a 40.00 pack-year smoking history. He has never used smokeless tobacco. He reports that he drinks about 4.2 oz of alcohol per week . He reports that he does not use drugs.  ROS: UROLOGY Frequent Urination?: Yes Hard to postpone urination?: No Burning/pain with urination?: No Get up at night to urinate?: Yes Leakage of urine?: No Urine stream starts and stops?: No Trouble starting stream?: No Do you have to strain to  urinate?: No Blood in urine?: No Urinary tract infection?: No Sexually transmitted disease?: No Injury to kidneys or bladder?: No Painful intercourse?: No Weak stream?: Yes Erection problems?: Yes Penile pain?: No  Gastrointestinal Nausea?: No Vomiting?: No Indigestion/heartburn?: No Diarrhea?: No Constipation?: No  Constitutional Fever: No Night sweats?: No Weight loss?: No Fatigue?: No  Skin Skin rash/lesions?: No Itching?: No  Eyes Blurred vision?: No Double vision?: No  Ears/Nose/Throat Sore throat?: No Sinus problems?: No  Hematologic/Lymphatic Swollen glands?: No Easy bruising?: No  Cardiovascular Leg swelling?: No Chest pain?: No  Respiratory Cough?: No Shortness of breath?: No  Endocrine Excessive thirst?: Yes  Musculoskeletal Back pain?: Yes Joint pain?: No  Neurological Headaches?: No Dizziness?: No  Psychologic Depression?: No Anxiety?: No  Physical Exam: BP (!) 160/86 (BP Location: Left Arm, Patient Position: Sitting, Cuff Size: Large)   Pulse (!) 59   Ht 5' 11.5" (1.816 m)   Wt 264 lb 11.2 oz (120.1 kg)   BMI 36.40 kg/m   Constitutional:  Alert and oriented, No acute distress. HEENT: [place] AT, moist mucus membranes.  Trachea midline, no masses. Cardiovascular: No clubbing, cyanosis, or edema. Respiratory: Normal respiratory effort, no increased work of breathing. GI: Abdomen is soft, nontender, nondistended, no abdominal masses Skin: No rashes, bruises or suspicious lesions. Lymph: No cervical or inguinal adenopathy. Neurologic: Grossly intact, no focal deficits, moving all 4 extremities. Psychiatric: Normal mood and affect.  Laboratory Data: Lab Results  Component Value Date   WBC 11.2 (H) 12/05/2015   HGB 14.0 12/06/2015   HCT 43.6 12/05/2015   MCV 88.0 12/05/2015   PLT 148 (L) 12/05/2015    Lab Results  Component Value Date   CREATININE 1.26 (H) 12/06/2015    Lab Results  Component Value Date   PSA 2.88  03/21/2016   PSA 1.23 06/18/2012   PSA 1.45 06/20/2011    No results found for: TESTOSTERONE  No results found for: HGBA1C  Urinalysis    Component Value Date/Time   BILIRUBINUR neg 03/06/2016 1212   PROTEINUR neg 03/06/2016 1212   UROBILINOGEN 0.2 03/06/2016 1212   NITRITE neg 03/06/2016 1212   LEUKOCYTESUR Negative 03/06/2016 1212    Pertinent Imaging: I reviewed non-con CT. Good visual of kidneys and ureters.   Assessment & Plan:   1) gross and microscopic hematuria - discussed cystoscopy and he elects to proceed. I don't feel he needs contrasted study at  this point.   2) BPH with LUTS - stable. Eval as above.    No Follow-up on file.  Festus Aloe, [facility] [address] [place], [place] [address] [phone]

## 2016-05-01 ENCOUNTER — Ambulatory Visit (INDEPENDENT_AMBULATORY_CARE_PROVIDER_SITE_OTHER): Payer: Medicare Other | Admitting: Urology

## 2016-05-01 ENCOUNTER — Encounter: Payer: Self-pay | Admitting: Urology

## 2016-05-01 DIAGNOSIS — R3912 Poor urinary stream: Secondary | ICD-10-CM | POA: Insufficient documentation

## 2016-05-01 DIAGNOSIS — R310 Gross hematuria: Secondary | ICD-10-CM | POA: Diagnosis not present

## 2016-05-01 DIAGNOSIS — Z0000 Encounter for general adult medical examination without abnormal findings: Secondary | ICD-10-CM | POA: Insufficient documentation

## 2016-05-01 DIAGNOSIS — Z125 Encounter for screening for malignant neoplasm of prostate: Secondary | ICD-10-CM

## 2016-05-01 LAB — URINALYSIS, COMPLETE
Bilirubin, UA: NEGATIVE
GLUCOSE, UA: NEGATIVE
Ketones, UA: NEGATIVE
Leukocytes, UA: NEGATIVE
NITRITE UA: NEGATIVE
Protein, UA: NEGATIVE
Specific Gravity, UA: 1.02 (ref 1.005–1.030)
UUROB: 1 mg/dL (ref 0.2–1.0)
pH, UA: 6 (ref 5.0–7.5)

## 2016-05-01 LAB — MICROSCOPIC EXAMINATION: Bacteria, UA: NONE SEEN

## 2016-05-01 MED ORDER — LIDOCAINE HCL 2 % EX GEL
1.0000 "application " | Freq: Once | CUTANEOUS | Status: AC
  Administered 2016-05-01: 1 via URETHRAL

## 2016-05-01 MED ORDER — FINASTERIDE 5 MG PO TABS: 5.0000 mg | ORAL_TABLET | Freq: Every day | ORAL | 3 refills | Status: AC

## 2016-05-01 MED ORDER — CIPROFLOXACIN HCL 500 MG PO TABS
500.0000 mg | ORAL_TABLET | Freq: Once | ORAL | Status: AC
  Administered 2016-05-01: 500 mg via ORAL

## 2016-05-01 NOTE — Progress Notes (Signed)
04/04/2016 3:11 PM   James [name] 11-27-1945 [id number]  Referring provider: Tonia Ghent, MD [address] [place], [place] [address]  Chief Complaint  Patient presents with  . New Patient (Initial Visit)    HPI: 1) Gross hematuria - non-recurrent episide gross hematuria after colonoscopoy that was complicated by re-admission for rectal bleeding. Subsequent UA normal. CT non-con A/P w/o stoens or large upper tract masses. Cysto 04/2016 with large bilobar prostatic hypertrophy only.   2) BPH with LUTS - pt was having frequency, nocturia, weak stream. He is on doxazosin for HTN and this was increased from 4 - 6 mg and then back to 4 mg. Prostate vol 40mg  by imaging. Staring finasteride 04/2016.  3) Prostate Screening -  2017 - PSA 2.88, DRE 40gm smooth  Today, James [name] is seen for the above. His symptoms have improved. He has nocturia x 2 which is chronic.    PMH: Past Medical History:  Diagnosis Date  . Acute lower GI bleeding after colonoscopy and polypectomy, resolved 12/12/2015  . Cataract    bilateral surgery to remove  . Colon polyp   . Constipation   . Hemorrhoids   . Hepatitis    self resolved, likely food exposure, 1974.   Marland Kitchen Hx of adenomatous colonic polyps 12/05/2015  . Hyperlipidemia   . Hypertension   . Hypothyroidism   . Skin cancer    basal cell, R ear, MOHS    Surgical History: Past Surgical History:  Procedure Laterality Date  . CATARACT EXTRACTION  1986   OD  . CATARACT EXTRACTION Bilateral 1995   x 2 for right and left   . COLONOSCOPY    . LIPOMA EXCISION  06/1997   left neck ([place])  . MOHS SURGERY Right 2013   behind right ear  . TONSILLECTOMY    . VASECTOMY  1982    Home Medications:    Medication List       Accurate as of 04/04/16  3:11 PM. Always use your most recent med list.          aspirin 81 MG tablet Take 81 mg by mouth daily.   doxazosin 4 MG tablet Commonly known as:  CARDURA Take 1-2 tablets (4-8 mg  total) by mouth daily.   Fish Oil 1000 MG Caps Take by mouth.   hydrocortisone 2.5 % rectal cream Commonly known as:  ANUSOL-HC Apply rectally 3  times daily as needed.   levothyroxine 150 MCG tablet Commonly known as:  SYNTHROID, LEVOTHROID TAKE 1 TABLET ONCE DAILY   metoprolol succinate 25 MG 24 hr tablet Commonly known as:  TOPROL XL Take 1 tablet (25 mg total) by mouth daily.   multivitamin tablet Take 1 tablet by mouth daily.   psyllium 58.6 % powder Commonly known as:  METAMUCIL Take 1 packet by mouth daily.   ramipril 10 MG capsule Commonly known as:  ALTACE Take 1 capsule (10 mg total) by mouth daily.   simvastatin 40 MG tablet Commonly known as:  ZOCOR Take 1 tablet (40 mg total) by mouth at bedtime.       Allergies:  Allergies  Allergen Reactions  . Sulfonamide Derivatives Rash    REACTION: UNKNOWN AN CHILD    Family History: Family History  Problem Relation Age of Onset  . Heart disease Paternal Grandfather     MI, old age  . Stroke Mother   . Lung cancer Maternal Grandfather     smoker  . Stroke  Maternal Grandfather   . Colon cancer Neg Hx   . Colon polyps Neg Hx   . Esophageal cancer Neg Hx   . Rectal cancer Neg Hx   . Stomach cancer Neg Hx   . Bladder Cancer Neg Hx   . Prostate cancer Neg Hx     Social History:  reports that he quit smoking about 32 years ago. His smoking use included Cigarettes. He has a 40.00 pack-year smoking history. He has never used smokeless tobacco. He reports that he drinks about 4.2 oz of alcohol per week . He reports that he does not use drugs.  ROS: UROLOGY Frequent Urination?: Yes Hard to postpone urination?: No Burning/pain with urination?: No Get up at night to urinate?: Yes Leakage of urine?: No Urine stream starts and stops?: No Trouble starting stream?: No Do you have to strain to urinate?: No Blood in urine?: No Urinary tract infection?: No Sexually transmitted disease?: No Injury to kidneys  or bladder?: No Painful intercourse?: No Weak stream?: Yes Erection problems?: Yes Penile pain?: No  Gastrointestinal Nausea?: No Vomiting?: No Indigestion/heartburn?: No Diarrhea?: No Constipation?: No  Constitutional Fever: No Night sweats?: No Weight loss?: No Fatigue?: No  Skin Skin rash/lesions?: No Itching?: No  Eyes Blurred vision?: No Double vision?: No  Ears/Nose/Throat Sore throat?: No Sinus problems?: No  Hematologic/Lymphatic Swollen glands?: No Easy bruising?: No  Cardiovascular Leg swelling?: No Chest pain?: No  Respiratory Cough?: No Shortness of breath?: No  Endocrine Excessive thirst?: Yes  Musculoskeletal Back pain?: Yes Joint pain?: No  Neurological Headaches?: No Dizziness?: No  Psychologic Depression?: No Anxiety?: No  Physical Exam: BP (!) 160/86 (BP Location: Left Arm, Patient Position: Sitting, Cuff Size: Large)   Pulse (!) 59   Ht 5' 11.5" (1.816 m)   Wt 264 lb 11.2 oz (120.1 kg)   BMI 36.40 kg/m   Constitutional:  Alert and oriented, No acute distress. HEENT: [place] AT, moist mucus membranes.  Trachea midline, no masses. Cardiovascular: No clubbing, cyanosis, or edema. Respiratory: Normal respiratory effort, no increased work of breathing. GI: Abdomen is soft, nontender, nondistended, no abdominal masses Skin: No rashes, bruises or suspicious lesions. Lymph: No cervical or inguinal adenopathy. Neurologic: Grossly intact, no focal deficits, moving all 4 extremities. Psychiatric: Normal mood and affect.  Laboratory Data: Lab Results  Component Value Date   WBC 11.2 (H) 12/05/2015   HGB 14.0 12/06/2015   HCT 43.6 12/05/2015   MCV 88.0 12/05/2015   PLT 148 (L) 12/05/2015    Lab Results  Component Value Date   CREATININE 1.26 (H) 12/06/2015    Lab Results  Component Value Date   PSA 2.88 03/21/2016   PSA 1.23 06/18/2012   PSA 1.45 06/20/2011    No results found for: TESTOSTERONE  No results found for:  HGBA1C  Urinalysis    Component Value Date/Time   BILIRUBINUR neg 03/06/2016 1212   PROTEINUR neg 03/06/2016 1212   UROBILINOGEN 0.2 03/06/2016 1212   NITRITE neg 03/06/2016 1212   LEUKOCYTESUR Negative 03/06/2016 1212      Cystoscopy Procedure Note  Patient identification was confirmed, informed consent was obtained, and patient was prepped using Betadine solution.  Lidocaine jelly was administered per urethral meatus.    Preoperative abx where received prior to procedure.     Pre-Procedure: - Inspection reveals a normal caliber ureteral meatus.  Procedure: The flexible cystoscope was introduced without difficulty - No urethral strictures/lesions are present. - Enlarged prostate bilobar with kissing lobes. - Normal bladder  neck - Bilateral ureteral orifices identified - Bladder mucosa  reveals no ulcers, tumors, or lesions - No bladder stones - No trabeculation  Retroflexion shows no addiitonal findings.    Post-Procedure: - Patient tolerated the procedure well    Assessment & Plan:   1) Gross hematuria - most likely from BPH given history, exam, and eval. Discussed role of daily finasteride for this and to help LUTS and he opts for trial. Time course to improvement (mos) discussed. Also frankly discussed that future unexplained gross episodes need to prompte repeat eval.   2) BPH with LUTS - add finasteride in adidtion to cardura.   3) Prostate Screening -  Up to date thisyear, continue annual screening until age 57 or so as average risk.   RTC 1 year with PSA prior.    No Follow-up on file.  Festus Aloe, [facility] [address] [place], [place] [address] [phone]

## 2016-05-11 ENCOUNTER — Ambulatory Visit (INDEPENDENT_AMBULATORY_CARE_PROVIDER_SITE_OTHER): Payer: Medicare Other

## 2016-05-11 VITALS — BP 176/101 | HR 60 | Temp 98.0°F | Wt 265.4 lb

## 2016-05-11 DIAGNOSIS — R3912 Poor urinary stream: Secondary | ICD-10-CM | POA: Diagnosis not present

## 2016-05-11 LAB — URINALYSIS, COMPLETE
BILIRUBIN UA: NEGATIVE
Glucose, UA: NEGATIVE
Ketones, UA: NEGATIVE
Leukocytes, UA: NEGATIVE
Nitrite, UA: NEGATIVE
PH UA: 7 (ref 5.0–7.5)
Protein, UA: NEGATIVE
Specific Gravity, UA: 1.015 (ref 1.005–1.030)
UUROB: 0.2 mg/dL (ref 0.2–1.0)

## 2016-05-11 LAB — MICROSCOPIC EXAMINATION: BACTERIA UA: NONE SEEN

## 2016-05-11 NOTE — Progress Notes (Signed)
Pt came in today stating he had a cysto on 9/26 and since then feels as though he has developed a UTI. Pt described s/s to be urinary frequency, burning on urination, back pain, and lower abd pain. Pt stated he only had cipro that was given at cysto. A clean catch urine was obtained for a u/a and cx.   Pt BP is very high. Pt refused to go to ER due to not having taken BP meds today. Reinforce with pt to take BP med and an hour later take BP again. Reinforced with pt should BP not come down he should call PCP or go to ER. Pt voiced understanding.  Blood pressure (!) 176/101, pulse 60, temperature 98 F (36.7 C), weight 265 lb 6.4 oz (120.4 kg).

## 2016-05-14 ENCOUNTER — Telehealth: Payer: Self-pay

## 2016-05-14 LAB — CULTURE, URINE COMPREHENSIVE

## 2016-05-14 NOTE — Telephone Encounter (Signed)
-----   Message from Hollice Espy, MD sent at 05/11/2016  4:41 PM EDT ----- UA negative.  No evidence of infection.    Hollice Espy, MD

## 2016-05-14 NOTE — Telephone Encounter (Signed)
Spoke with pt in reference to u/a results. Pt voiced understanding.  

## 2016-05-15 ENCOUNTER — Telehealth: Payer: Self-pay

## 2016-05-15 DIAGNOSIS — N390 Urinary tract infection, site not specified: Secondary | ICD-10-CM

## 2016-05-15 MED ORDER — CIPROFLOXACIN HCL 500 MG PO TABS: 500.0000 mg | ORAL_TABLET | Freq: Two times a day (BID) | ORAL | 0 refills | Status: DC

## 2016-05-15 NOTE — Telephone Encounter (Signed)
-----   Message from Hollice Espy, MD sent at 05/15/2016  8:33 AM EDT ----- Mr. Swearinger ended up growing VERY low count of what appears to be contaminant in setting of negative UA.  Can you please call and see if his symptoms are resolving/ improving?  Hollice Espy, MD

## 2016-05-15 NOTE — Telephone Encounter (Signed)
Spoke with pt in reference to s/s of UTI. Pt stated that his s/s are still present and burning is starting to get worse.

## 2016-05-15 NOTE — Telephone Encounter (Signed)
I am still not convinced he has an infection.  We can go ahead and treat with cipro 500 mg po bid x 7 days but advise him to drink plenty of water and contact us if not improved.    Hollice Espy, MD

## 2016-05-15 NOTE — Telephone Encounter (Signed)
Spoke with pt in reference to s/s of UTI and abx. Made aware abx were sent to pharmacy. Pt voiced understanding.

## 2016-05-30 ENCOUNTER — Encounter: Payer: Self-pay | Admitting: Internal Medicine

## 2016-06-12 ENCOUNTER — Encounter: Payer: Self-pay | Admitting: Internal Medicine

## 2016-07-15 ENCOUNTER — Other Ambulatory Visit: Payer: Self-pay | Admitting: Family Medicine

## 2016-07-15 DIAGNOSIS — I10 Essential (primary) hypertension: Secondary | ICD-10-CM

## 2016-07-16 ENCOUNTER — Other Ambulatory Visit (INDEPENDENT_AMBULATORY_CARE_PROVIDER_SITE_OTHER): Payer: Medicare Other

## 2016-07-16 DIAGNOSIS — I10 Essential (primary) hypertension: Secondary | ICD-10-CM

## 2016-07-16 LAB — LIPID PANEL
Cholesterol: 177 mg/dL (ref 0–200)
HDL: 42.4 mg/dL (ref >39.00)
LDL CALC: 104 mg/dL — AB (ref 0–99)
NONHDL: 134.73
Total CHOL/HDL Ratio: 4
Triglycerides: 153 mg/dL — ABNORMAL HIGH (ref 0.0–149.0)
VLDL: 30.6 mg/dL (ref 0.0–40.0)

## 2016-07-16 LAB — COMPREHENSIVE METABOLIC PANEL
ALT: 10 U/L (ref 0–53)
AST: 12 U/L (ref 0–37)
Albumin: 3.9 g/dL (ref 3.5–5.2)
Alkaline Phosphatase: 53 U/L (ref 39–117)
BUN: 15 mg/dL (ref 6–23)
CHLORIDE: 104 meq/L (ref 96–112)
CO2: 29 mEq/L (ref 19–32)
Calcium: 9.6 mg/dL (ref 8.4–10.5)
Creatinine, Ser: 1.22 mg/dL (ref 0.40–1.50)
GFR: 62.6 mL/min (ref >60.00)
GLUCOSE: 93 mg/dL (ref 70–99)
POTASSIUM: 4.4 meq/L (ref 3.5–5.1)
SODIUM: 140 meq/L (ref 135–145)
Total Bilirubin: 0.8 mg/dL (ref 0.2–1.2)
Total Protein: 6.6 g/dL (ref 6.0–8.3)

## 2016-07-16 LAB — TSH: TSH: 2.2 u[IU]/mL (ref 0.35–4.50)

## 2016-07-23 ENCOUNTER — Encounter: Payer: Self-pay | Admitting: Family Medicine

## 2016-07-23 ENCOUNTER — Ambulatory Visit (INDEPENDENT_AMBULATORY_CARE_PROVIDER_SITE_OTHER): Payer: Medicare Other | Admitting: Family Medicine

## 2016-07-23 VITALS — BP 130/70 | HR 90 | Temp 97.7°F | Wt 264.5 lb

## 2016-07-23 DIAGNOSIS — E7800 Pure hypercholesterolemia, unspecified: Secondary | ICD-10-CM | POA: Diagnosis not present

## 2016-07-23 DIAGNOSIS — I10 Essential (primary) hypertension: Secondary | ICD-10-CM | POA: Diagnosis not present

## 2016-07-23 DIAGNOSIS — Z0000 Encounter for general adult medical examination without abnormal findings: Secondary | ICD-10-CM

## 2016-07-23 DIAGNOSIS — E039 Hypothyroidism, unspecified: Secondary | ICD-10-CM

## 2016-07-23 MED ORDER — DOXAZOSIN MESYLATE 4 MG PO TABS
4.0000 mg | ORAL_TABLET | Freq: Every day | ORAL | Status: DC
Start: 2016-07-23 — End: 2016-07-23

## 2016-07-23 MED ORDER — DOXAZOSIN MESYLATE 4 MG PO TABS: 4.0000 mg | ORAL_TABLET | Freq: Every day | ORAL | 3 refills | Status: DC

## 2016-07-23 MED ORDER — SIMVASTATIN 40 MG PO TABS: 40.0000 mg | ORAL_TABLET | Freq: Every day | ORAL | 3 refills | Status: DC

## 2016-07-23 MED ORDER — LEVOTHYROXINE SODIUM 150 MCG PO TABS: ORAL_TABLET | ORAL | 3 refills | Status: DC

## 2016-07-23 MED ORDER — METOPROLOL SUCCINATE ER 25 MG PO TB24: 25.0000 mg | ORAL_TABLET | Freq: Every day | ORAL | 3 refills | Status: DC

## 2016-07-23 MED ORDER — RAMIPRIL 10 MG PO CAPS: 10.0000 mg | ORAL_CAPSULE | Freq: Every day | ORAL | 3 refills | Status: DC

## 2016-07-23 NOTE — Patient Instructions (Signed)
Take care.  Glad to see you. Update me as needed.  

## 2016-07-23 NOTE — Assessment & Plan Note (Signed)
Controlled, continue as is.  No ADE on med.  Labs d/w pt.  He agrees.  Diet and exercise d/w pt.

## 2016-07-23 NOTE — Assessment & Plan Note (Signed)
Flu done 04/2016 at hospital.   Shingles 2009 PNA 2015 Tetanus 2009 F/u colonoscopy pending . PSA prev done 2017.  Has seen urology in the meantime.   Advance directive. D/w pt. Encouraged. Wife would be designated if incapacitated.  Cognitive function addressed- see scanned forms- and if abnormal then additional documentation follows.  AAA screening prev done.

## 2016-07-23 NOTE — Progress Notes (Signed)
I have personally reviewed the Medicare Annual Wellness questionnaire and have noted 1. The patient's medical and social history 2. Their use of alcohol, tobacco or illicit drugs 3. Their current medications and supplements 4. The patient's functional ability including ADL's, fall risks, home safety risks and hearing or visual             impairment. 5. Diet and physical activities 6. Evidence for depression or mood disorders  The patients weight, height, BMI have been recorded in the chart and visual acuity is per eye clinic.  I have made referrals, counseling and provided education to the patient based review of the above and I have provided the pt with a written personalized care plan for preventive services.  Provider list updated- see scanned forms.  Routine anticipatory guidance given to patient.  See health maintenance.  Flu done 04/2016 at hospital.   Shingles 2009 PNA 2015 Tetanus 2009 F/u colonoscopy pending . PSA prev done 2017.  Has seen urology in the meantime.   Advance directive. D/w pt. Encouraged. Wife would be designated if incapacitated.  Cognitive function addressed- see scanned forms- and if abnormal then additional documentation follows.  AAA screening prev done.    Hypertension:    Using medication without problems or lightheadedness: yes Chest pain with exertion:no Edema:no Short of breath:no  Some recent heel pain with exercise, improved in the meantime with relative rest from exercise.    Elevated Cholesterol: Using medications without problems:yes Muscle aches: no Diet compliance:encouraged Exercise: encouraged.  See above.   Hypothyroidism.  TSH wnl.  Compliant.  D/w pt.   No ADE on med.  Labs d/w pt.    PMH and SH reviewed  Meds, vitals, and allergies reviewed.   ROS: Per HPI.  Unless specifically indicated otherwise in HPI, the patient denies:  General: fever. Eyes: acute vision changes ENT: sore throat Cardiovascular: chest  pain Respiratory: SOB GI: vomiting GU: dysuria Musculoskeletal: acute back pain Derm: acute rash Neuro: acute motor dysfunction Psych: worsening mood Endocrine: polydipsia Heme: bleeding Allergy: hayfever  GEN: nad, alert and oriented HEENT: mucous membranes moist NECK: supple w/o LA CV: rrr. PULM: ctab, no inc wob ABD: soft, +bs EXT: no edema SKIN: no acute rash

## 2016-07-23 NOTE — Assessment & Plan Note (Signed)
Controlled, continue as is.  No ADE on med.  Labs d/w pt.  He agrees.

## 2016-07-23 NOTE — Progress Notes (Signed)
Pre visit review using our clinic review tool, if applicable. No additional management support is needed unless otherwise documented below in the visit note. 

## 2016-08-09 ENCOUNTER — Ambulatory Visit (AMBULATORY_SURGERY_CENTER): Payer: Self-pay | Admitting: *Deleted

## 2016-08-09 VITALS — Ht 72.0 in | Wt 269.4 lb

## 2016-08-09 DIAGNOSIS — Z86010 Personal history of colonic polyps: Secondary | ICD-10-CM

## 2016-08-09 NOTE — Progress Notes (Signed)
No egg or soy allergy  No anesthesia or intubation problems per pt  No diet medications taken  Registered in EMMI  No home oxygen used or hx of sleep apnea

## 2016-08-10 DIAGNOSIS — H02831 Dermatochalasis of right upper eyelid: Secondary | ICD-10-CM | POA: Diagnosis not present

## 2016-08-13 ENCOUNTER — Encounter: Payer: Self-pay | Admitting: Internal Medicine

## 2016-08-16 DIAGNOSIS — H02834 Dermatochalasis of left upper eyelid: Secondary | ICD-10-CM | POA: Diagnosis not present

## 2016-08-16 DIAGNOSIS — H02831 Dermatochalasis of right upper eyelid: Secondary | ICD-10-CM | POA: Diagnosis not present

## 2016-08-21 ENCOUNTER — Ambulatory Visit (AMBULATORY_SURGERY_CENTER): Payer: Medicare Other | Admitting: Internal Medicine

## 2016-08-21 ENCOUNTER — Encounter: Payer: Self-pay | Admitting: Internal Medicine

## 2016-08-21 VITALS — BP 130/73 | HR 61 | Temp 96.9°F | Resp 11 | Ht 72.0 in | Wt 269.0 lb

## 2016-08-21 DIAGNOSIS — D122 Benign neoplasm of ascending colon: Secondary | ICD-10-CM

## 2016-08-21 DIAGNOSIS — Z86010 Personal history of colonic polyps: Secondary | ICD-10-CM | POA: Diagnosis present

## 2016-08-21 DIAGNOSIS — D123 Benign neoplasm of transverse colon: Secondary | ICD-10-CM

## 2016-08-21 DIAGNOSIS — I10 Essential (primary) hypertension: Secondary | ICD-10-CM | POA: Diagnosis not present

## 2016-08-21 MED ORDER — SODIUM CHLORIDE 0.9 % IV SOLN: 500.0000 mL | INTRAVENOUS | Status: DC

## 2016-08-21 NOTE — Progress Notes (Signed)
Called to room to assist during endoscopic procedure.  Patient ID and intended procedure confirmed with present staff. Received instructions for my participation in the procedure from the performing physician.  

## 2016-08-21 NOTE — Op Note (Signed)
James [name] Patient Name: James [name] Procedure Date: 08/21/2016 1:19 PM MRN: [id number] Endoscopist: Gatha Mayer , MD Age: 71 Referring MD:  Date of Birth: 1945-12-08 Gender: Male Account #: [id number] Procedure:                Colonoscopy Indications:              Surveillance: Piecemeal removal of large sessile                            adenoma last colonoscopy (< 3 yrs) Medicines:                Propofol per Anesthesia, Monitored Anesthesia Care Procedure:                Pre-Anesthesia Assessment:                           - Prior to the procedure, a History and Physical                            was performed, and patient medications and                            allergies were reviewed. The patient's tolerance of                            previous anesthesia was also reviewed. The risks                            and benefits of the procedure and the sedation                            options and risks were discussed with the patient.                            All questions were answered, and informed consent                            was obtained. Prior Anticoagulants: The patient                            last took aspirin 1 day prior to the procedure. ASA                            Grade Assessment: II - A patient with mild systemic                            disease. After reviewing the risks and benefits,                            the patient was deemed in satisfactory condition to                            undergo the procedure.  After obtaining informed consent, the colonoscope                            was passed under direct vision. Throughout the                            procedure, the patient's blood pressure, pulse, and                            oxygen saturations were monitored continuously. The                            Model CF-HQ190L [id number]) scope was introduced                            through the  anus and advanced to the the cecum,                            identified by appendiceal orifice and ileocecal                            valve. The colonoscopy was performed without                            difficulty. The patient tolerated the procedure                            well. The quality of the bowel preparation was                            excellent. The bowel preparation used was Miralax.                            The ileocecal valve, appendiceal orifice, and                            rectum were photographed. Scope In: 1:27:07 PM Scope Out: 1:44:13 PM Scope Withdrawal Time: 0 hours 14 minutes 55 seconds  Total Procedure Duration: 0 hours 17 minutes 6 seconds  Findings:                 The perianal and digital rectal examinations were                            normal. Pertinent negatives include normal prostate                            (size, shape, and consistency).                           Three sessile polyps were found in the transverse                            colon and ascending colon. The polyps were 2 to 6  mm in size. These polyps were removed with a cold                            snare. Resection and retrieval were complete.                            Verification of patient identification for the                            specimen was done. Estimated blood loss was minimal.                           A medium post polypectomy scar was found in the                            ascending colon. Tattoos also noted. There was no                            evidence of the previous polyp. Biopsies were taken                            with a cold forceps for histology. Verification of                            patient identification for the specimen was done.                            Estimated blood loss was minimal.                           Multiple small and large-mouthed diverticula were                            found in the  entire colon.                           The exam was otherwise without abnormality on                            direct and retroflexion views. Complications:            No immediate complications. Estimated Blood Loss:     Estimated blood loss was minimal. Impression:               - Three 2 to 6 mm polyps in the transverse colon                            and in the ascending colon, removed with a cold                            snare. Resected and retrieved.                           - Post-polypectomy scar in the ascending colon.  Biopsied.                           - Diverticulosis in the entire examined colon.                           - The examination was otherwise normal on direct                            and retroflexion views.                           - Personal history of colonic polyps. Recommendation:           - Patient has a contact number available for                            emergencies. The signs and symptoms of potential                            delayed complications were discussed with the                            patient. Return to normal activities tomorrow.                            Written discharge instructions were provided to the                            patient.                           - Continue present medications.                           - Resume previous diet.                           - Repeat colonoscopy is recommended. The                            colonoscopy date will be determined after pathology                            results from today's exam become available for                            review. Gatha Mayer, MD 08/21/2016 2:01:42 PM This report has been signed electronically.

## 2016-08-21 NOTE — Progress Notes (Signed)
To PACU PT awake and alert. Report to RN 

## 2016-08-21 NOTE — Patient Instructions (Addendum)
I found and removed 3 other small polyps. The large polyp looks completely gone though I took biopsies.  I will let you know pathology results and when to have another routine colonoscopy by mail.  I appreciate the opportunity to care for you. Gatha Mayer, MD, FACG     YOU HAD AN ENDOSCOPIC PROCEDURE TODAY AT [facility]:   Refer to the procedure report that was given to you for any specific questions about what was found during the examination.  If the procedure report does not answer your questions, please call your gastroenterologist to clarify.  If you requested that your care partner not be given the details of your procedure findings, then the procedure report has been included in a sealed envelope for you to review at your convenience later.  YOU SHOULD EXPECT: Some feelings of bloating in the abdomen. Passage of more gas than usual.  Walking can help get rid of the air that was put into your GI tract during the procedure and reduce the bloating. If you had a lower endoscopy (such as a colonoscopy or flexible sigmoidoscopy) you may notice spotting of blood in your stool or on the toilet paper. If you underwent a bowel prep for your procedure, you may not have a normal bowel movement for a few days.  Please Note:  You might notice some irritation and congestion in your nose or some drainage.  This is from the oxygen used during your procedure.  There is no need for concern and it should clear up in a day or so.  SYMPTOMS TO REPORT IMMEDIATELY:   Following lower endoscopy (colonoscopy or flexible sigmoidoscopy):  Excessive amounts of blood in the stool  Significant tenderness or worsening of abdominal pains  Swelling of the abdomen that is new, acute  Fever of 100F or higher    For urgent or emergent issues, a gastroenterologist can be reached at any hour by calling [phone].   DIET:  We do recommend a small meal at first, but then you may  proceed to your regular diet.  Drink plenty of fluids but you should avoid alcoholic beverages for 24 hours.  ACTIVITY:  You should plan to take it easy for the rest of today and you should NOT DRIVE or use heavy machinery until tomorrow (because of the sedation medicines used during the test).    FOLLOW UP: Our staff will call the number listed on your records the next business day following your procedure to check on you and address any questions or concerns that you may have regarding the information given to you following your procedure. If we do not reach you, we will leave a message.  However, if you are feeling well and you are not experiencing any problems, there is no need to return our call.  We will assume that you have returned to your regular daily activities without incident.  If any biopsies were taken you will be contacted by phone or by letter within the next 1-3 weeks.  Please call us at [phone] if you have not heard about the biopsies in 3 weeks.    SIGNATURES/CONFIDENTIALITY: You and/or your care partner have signed paperwork which will be entered into your electronic medical record.  These signatures attest to the fact that that the information above on your After Visit Summary has been reviewed and is understood.  Full responsibility of the confidentiality of this discharge information lies with you and/or your care-partner.  Resume medications. Information given on polyps and diverticulosis.

## 2016-08-22 ENCOUNTER — Telehealth: Payer: Self-pay | Admitting: *Deleted

## 2016-08-22 NOTE — Telephone Encounter (Signed)
Message left

## 2016-08-28 ENCOUNTER — Encounter: Payer: Self-pay | Admitting: Internal Medicine

## 2016-08-28 NOTE — Progress Notes (Signed)
3 adenomas and no residual polyp Colon recall 2021

## 2016-09-04 ENCOUNTER — Other Ambulatory Visit: Payer: Self-pay | Admitting: Family Medicine

## 2016-09-12 ENCOUNTER — Telehealth: Payer: Self-pay

## 2016-09-12 NOTE — Telephone Encounter (Signed)
Pt request refill ramipril and metoprolol; advised to ck with [facility] and speak with someone; refills done on 07/23/16. Pt voiced understanding.

## 2016-09-18 ENCOUNTER — Other Ambulatory Visit: Payer: Self-pay | Admitting: *Deleted

## 2016-09-18 NOTE — Telephone Encounter (Signed)
Received faxed refill request Last office visit 07/23/16 No upcoming appointment scheduled

## 2016-09-19 MED ORDER — DOXAZOSIN MESYLATE 4 MG PO TABS: 4.0000 mg | ORAL_TABLET | Freq: Every day | ORAL | 3 refills | Status: DC

## 2016-09-19 MED ORDER — LEVOTHYROXINE SODIUM 150 MCG PO TABS: ORAL_TABLET | ORAL | 3 refills | Status: DC

## 2016-09-19 NOTE — Telephone Encounter (Signed)
Sent. Thanks.   

## 2016-09-25 DIAGNOSIS — Z85820 Personal history of malignant melanoma of skin: Secondary | ICD-10-CM | POA: Diagnosis not present

## 2016-09-25 DIAGNOSIS — D2261 Melanocytic nevi of right upper limb, including shoulder: Secondary | ICD-10-CM | POA: Diagnosis not present

## 2016-09-25 DIAGNOSIS — D225 Melanocytic nevi of trunk: Secondary | ICD-10-CM | POA: Diagnosis not present

## 2016-09-25 DIAGNOSIS — Z85828 Personal history of other malignant neoplasm of skin: Secondary | ICD-10-CM | POA: Diagnosis not present

## 2016-10-15 ENCOUNTER — Encounter: Payer: Self-pay | Admitting: *Deleted

## 2016-10-18 NOTE — Discharge Instructions (Signed)
INSTRUCTIONS FOLLOWING OCULOPLASTIC SURGERY °AMY M. FOWLER, MD ° °AFTER YOUR EYE SURGERY, THER ARE MANY THINGS THWIHC YOU, THE PATIENT, CAN DO TO ASSURE THE BEST POSSIBLE RESULT FROM YOUR OPERATION.  THIS SHEET SHOULD BE REFERRED TO WHENEVER QUESTIONS ARISE.  IF THERE ARE ANY QUESTIONS NOT ANSWERED HERE, DO NOT HESITATE TO CALL OUR OFFICE AT [phone] OR [phone].  THERE IS ALWAYS [facility] AVAILABLE TO CALL IF QUESTIONS OR PROBLEMS ARISE. ° °VISION: Your vision may be blurred and out of focus after surgery until you are able to stop using your ointment, swelling resolves and your eye(s) heal. This may take 1 to 2 weeks at the least.  If your vision becomes gradually more dim or dark, this is not normal and you need to call our office immediately. ° °EYE CARE: For the first 48 hours after surgery, use ice packs frequently - “20 minutes on, 20 minutes off” - to help reduce swelling and bruising.  Small bags of frozen peas or corn make good ice packs along with cloths soaked in ice water.  If you are wearing a patch or other type of dressing following surgery, keep this on for the amount of time specified by your doctor.  For the first week following surgery, you will need to treat your stitches with great care.  If is OK to shower, but take care to not allow soapy water to run into your eye(s) to help reduce changes of infection.  You may gently clean the eyelashes and around the eye(s) with cotton balls and sterile water, BUT DO NOT RUB THE STITCHES VIGOROUSLY.  Keeping your stitches moist with ointment will help promote healing with minimal scar formation. ° °ACTIVITY: When you leave the surgery center, you should go home, rest and be inactive.  The eye(s) may feel scratchy and keeping the eyes closed will allow for faster healing.  The first week following surgery, avoid straining (anything making the face turn red) or lifting over 20 pounds.  Additionally, avoid bending which causes your head to go below  your waist.  Using your eyes will NOT harm them, so feel free to read, watch television, use the computer, etc as desired.  Driving depends on each individual, so check with your doctor if you have questions about driving. ° °MEDICATIONS:  You will be given a prescription for an ointment to use 4 times a day on your stitches.  You can use the ointment in your eyes if they feel scratchy or irritated.  If you eyelid(s) don’t close completely when you sleep, put some ointment in your eyes before bedtime. ° °EMERGENCY: If you experience SEVERE EYE PAIN OR HEADACHE UNRELIEVED BY TYLENOL OR PERCOCET, NAUSEA OR VOMITING, WORSENING REDNESS, OR WORSENING VISION (ESPECIALLY VISION THAT WA INITIALLY BETTER) CALL [phone] OR [phone] DURING BUSINESS HOURS OR AFTER HOURS. ° °General Anesthesia, Adult, Care After °These instructions provide you with information about caring for yourself after your procedure. Your health care provider may also give you more specific instructions. Your treatment has been planned according to current medical practices, but problems sometimes occur. Call your health care provider if you have any problems or questions after your procedure. °What can I expect after the procedure? °After the procedure, it is common to have: °· Vomiting. °· A sore throat. °· Mental slowness. °It is common to feel: °· Nauseous. °· Cold or shivery. °· Sleepy. °· Tired. °· Sore or achy, even in parts of your body where you did not have surgery. °Follow   these instructions at home: °For at least 24 hours after the procedure:  °· Do not: °¨ Participate in activities where you could fall or become injured. °¨ Drive. °¨ Use heavy machinery. °¨ Drink alcohol. °¨ Take sleeping pills or medicines that cause drowsiness. °¨ Make important decisions or sign legal documents. °¨ Take care of children on your own. °· Rest. °Eating and drinking  °· If you vomit, drink water, juice, or soup when you can drink without  vomiting. °· Drink enough fluid to keep your urine clear or pale yellow. °· Make sure you have little or no nausea before eating solid foods. °· Follow the diet recommended by your health care provider. °General instructions  °· Have a responsible adult stay with you until you are awake and alert. °· Return to your normal activities as told by your health care provider. Ask your health care provider what activities are safe for you. °· Take over-the-counter and prescription medicines only as told by your health care provider. °· If you smoke, do not smoke without supervision. °· Keep all follow-up visits as told by your health care provider. This is important. °Contact a health care provider if: °· You continue to have nausea or vomiting at home, and medicines are not helpful. °· You cannot drink fluids or start eating again. °· You cannot urinate after 8-12 hours. °· You develop a skin rash. °· You have fever. °· You have increasing redness at the site of your procedure. °Get help right away if: °· You have difficulty breathing. °· You have chest pain. °· You have unexpected bleeding. °· You feel that you are having a life-threatening or urgent problem. °This information is not intended to replace advice given to you by your health care provider. Make sure you discuss any questions you have with your health care provider. °Document Released: 10/29/2000 Document Revised: 12/26/2015 Document Reviewed: 07/07/2015 °Elsevier Interactive Patient Education © 2017 [employer] ° °

## 2016-10-23 ENCOUNTER — Encounter
Admission: RE | Disposition: A | Discharge status: Home / Self Care | Payer: Self-pay | Source: Ambulatory Visit | Attending: Ophthalmology

## 2016-10-23 ENCOUNTER — Ambulatory Visit: Payer: Medicare Other | Admitting: Anesthesiology

## 2016-10-23 ENCOUNTER — Ambulatory Visit
Admission: RE | Admit: 2016-10-23 | Discharge: 2016-10-23 | Disposition: A | Discharge status: Home / Self Care | Payer: Medicare Other | Source: Ambulatory Visit | Attending: Ophthalmology | Admitting: Ophthalmology

## 2016-10-23 DIAGNOSIS — H02002 Unspecified entropion of right lower eyelid: Secondary | ICD-10-CM | POA: Insufficient documentation

## 2016-10-23 DIAGNOSIS — H02834 Dermatochalasis of left upper eyelid: Secondary | ICD-10-CM | POA: Insufficient documentation

## 2016-10-23 DIAGNOSIS — H02042 Spastic entropion of right lower eyelid: Secondary | ICD-10-CM | POA: Diagnosis not present

## 2016-10-23 DIAGNOSIS — Z7982 Long term (current) use of aspirin: Secondary | ICD-10-CM | POA: Insufficient documentation

## 2016-10-23 DIAGNOSIS — I10 Essential (primary) hypertension: Secondary | ICD-10-CM | POA: Diagnosis not present

## 2016-10-23 DIAGNOSIS — H02032 Senile entropion of right lower eyelid: Secondary | ICD-10-CM | POA: Diagnosis not present

## 2016-10-23 DIAGNOSIS — H02009 Unspecified entropion of unspecified eye, unspecified eyelid: Secondary | ICD-10-CM | POA: Diagnosis present

## 2016-10-23 DIAGNOSIS — H02005 Unspecified entropion of left lower eyelid: Secondary | ICD-10-CM | POA: Diagnosis not present

## 2016-10-23 DIAGNOSIS — E039 Hypothyroidism, unspecified: Secondary | ICD-10-CM | POA: Diagnosis not present

## 2016-10-23 DIAGNOSIS — Z79899 Other long term (current) drug therapy: Secondary | ICD-10-CM | POA: Insufficient documentation

## 2016-10-23 DIAGNOSIS — Z87891 Personal history of nicotine dependence: Secondary | ICD-10-CM | POA: Insufficient documentation

## 2016-10-23 DIAGNOSIS — H02831 Dermatochalasis of right upper eyelid: Secondary | ICD-10-CM | POA: Diagnosis not present

## 2016-10-23 HISTORY — PX: ECTROPION REPAIR: SHX357

## 2016-10-23 HISTORY — PX: BROW LIFT: SHX178

## 2016-10-23 SURGERY — BLEPHAROPLASTY
Anesthesia: Monitor Anesthesia Care | Site: Eye | Laterality: Bilateral | Wound class: Clean

## 2016-10-23 MED ORDER — ERYTHROMYCIN 5 MG/GM OP OINT: TOPICAL_OINTMENT | OPHTHALMIC | 3 refills | Status: DC

## 2016-10-23 MED ORDER — LACTATED RINGERS IV SOLN
INTRAVENOUS | Status: DC | PRN
  Administered 2016-10-23: 09:00:00 via INTRAVENOUS

## 2016-10-23 MED ORDER — HYALURONIDASE HUMAN 150 UNIT/ML IJ SOLN
INTRAMUSCULAR | Status: DC | PRN
  Administered 2016-10-23: 8.5 mL via OPHTHALMIC
  Administered 2016-10-23: 3.5 mL via OPHTHALMIC

## 2016-10-23 MED ORDER — MEPERIDINE HCL 25 MG/ML IJ SOLN: 6.2500 mg | INTRAMUSCULAR | Status: DC | PRN

## 2016-10-23 MED ORDER — OXYCODONE HCL 5 MG/5ML PO SOLN: 5.0000 mg | Freq: Once | ORAL | Status: AC | PRN

## 2016-10-23 MED ORDER — TETRACAINE HCL 0.5 % OP SOLN
OPHTHALMIC | Status: DC | PRN
  Administered 2016-10-23: 2 [drp] via OPHTHALMIC

## 2016-10-23 MED ORDER — OXYCODONE HCL 5 MG PO TABS
5.0000 mg | ORAL_TABLET | Freq: Once | ORAL | Status: AC | PRN
  Administered 2016-10-23: 5 mg via ORAL

## 2016-10-23 MED ORDER — ERYTHROMYCIN 5 MG/GM OP OINT
TOPICAL_OINTMENT | OPHTHALMIC | Status: DC | PRN
  Administered 2016-10-23: 1 via OPHTHALMIC

## 2016-10-23 MED ORDER — ALFENTANIL 500 MCG/ML IJ INJ
INJECTION | INTRAMUSCULAR | Status: DC | PRN
  Administered 2016-10-23: 800 ug via INTRAVENOUS

## 2016-10-23 MED ORDER — OXYCODONE-ACETAMINOPHEN 5-325 MG PO TABS: 1.0000 | ORAL_TABLET | ORAL | 0 refills | Status: DC | PRN

## 2016-10-23 MED ORDER — MIDAZOLAM HCL 2 MG/2ML IJ SOLN
INTRAMUSCULAR | Status: DC | PRN
  Administered 2016-10-23 (×4): 0.5 mg via INTRAVENOUS

## 2016-10-23 MED ORDER — BSS IO SOLN
INTRAOCULAR | Status: DC | PRN
  Administered 2016-10-23: 4 mL

## 2016-10-23 MED ORDER — HYDROMORPHONE HCL 1 MG/ML IJ SOLN: 0.2500 mg | INTRAMUSCULAR | Status: DC | PRN

## 2016-10-23 MED ORDER — PROMETHAZINE HCL 25 MG/ML IJ SOLN
6.2500 mg | INTRAMUSCULAR | Status: DC | PRN
Start: 2016-10-23 — End: 2016-10-23

## 2016-10-23 MED ORDER — PROPOFOL 500 MG/50ML IV EMUL
INTRAVENOUS | Status: DC | PRN
  Administered 2016-10-23: 25 ug/kg/min via INTRAVENOUS

## 2016-10-23 SURGICAL SUPPLY — 38 items
APPLICATOR COTTON TIP WD 3 STR (MISCELLANEOUS) ×4 IMPLANT
BLADE SURG 15 STRL LF DISP TIS (BLADE) ×1 IMPLANT
BLADE SURG 15 STRL SS (BLADE) ×2
CORD BIP STRL DISP 12FT (MISCELLANEOUS) ×2 IMPLANT
DRAPE HEAD BAR (DRAPES) ×2 IMPLANT
GAUZE SPONGE 4X4 12PLY STRL (GAUZE/BANDAGES/DRESSINGS) ×2 IMPLANT
GAUZE SPONGE NON-WVN 2X2 STRL (MISCELLANEOUS) ×10 IMPLANT
GLOVE SURG LX 7.0 MICRO (GLOVE) ×2
GLOVE SURG LX STRL 7.0 MICRO (GLOVE) ×2 IMPLANT
MARKER SKIN XFINE TIP W/RULER (MISCELLANEOUS) ×2 IMPLANT
NDL FILTER BLUNT 18X1 1/2 (NEEDLE) ×1 IMPLANT
NDL HYPO 30X.5 LL (NEEDLE) ×2 IMPLANT
NEEDLE FILTER BLUNT 18X 1/2SAF (NEEDLE) ×1
NEEDLE FILTER BLUNT 18X1 1/2 (NEEDLE) ×1 IMPLANT
NEEDLE HYPO 30X.5 LL (NEEDLE) ×4 IMPLANT
PACK DRAPE NASAL/ENT (PACKS) ×2 IMPLANT
SOL PREP PVP 2OZ (MISCELLANEOUS) ×2
SOLUTION PREP PVP 2OZ (MISCELLANEOUS) ×1 IMPLANT
SPONGE VERSALON 2X2 STRL (MISCELLANEOUS) ×20
SUT CHROMIC 4-0 (SUTURE)
SUT CHROMIC 4-0 M2 12X2 ARM (SUTURE)
SUT CHROMIC 5 0 P 3 (SUTURE) IMPLANT
SUT ETHILON 4 0 CL P 3 (SUTURE) ×2 IMPLANT
SUT MERSILENE 4-0 S-2 (SUTURE) ×3 IMPLANT
SUT PDS AB 4-0 P3 18 (SUTURE) IMPLANT
SUT PLAIN GUT (SUTURE) ×3 IMPLANT
SUT PROLENE 5 0 P 3 (SUTURE) IMPLANT
SUT PROLENE 6 0 P 1 18 (SUTURE) IMPLANT
SUT SILK 4 0 G 3 (SUTURE) IMPLANT
SUT VIC AB 5-0 P-3 18X BRD (SUTURE) IMPLANT
SUT VIC AB 5-0 P3 18 (SUTURE)
SUT VICRYL 6-0  S14 CTD (SUTURE)
SUT VICRYL 6-0 S14 CTD (SUTURE) IMPLANT
SUT VICRYL 7 0 TG140 8 (SUTURE) IMPLANT
SUTURE CHRMC 4-0 M2 12X2 ARM (SUTURE) IMPLANT
SYR 3ML LL SCALE MARK (SYRINGE) ×2 IMPLANT
SYRINGE 10CC LL (SYRINGE) ×2 IMPLANT
WATER STERILE IRR 500ML POUR (IV SOLUTION) ×2 IMPLANT

## 2016-10-23 NOTE — Anesthesia Procedure Notes (Signed)
Procedure Name: MAC Performed by: Velvet Moomaw Pre-anesthesia Checklist: Patient identified, Emergency Drugs available, Suction available, Patient being monitored and Timeout performed Patient Re-evaluated:Patient Re-evaluated prior to inductionOxygen Delivery Method: Nasal cannula       

## 2016-10-23 NOTE — Interval H&P Note (Signed)
History and Physical Interval Note:  10/23/2016 8:54 AM  James [name]  has presented today for surgery, with the diagnosis of H02.831 Dermatochalasis H02.834 entropion senile H02.032 H02.042 Entropion spastic  The various methods of treatment have been discussed with the patient and family. After consideration of risks, benefits and other options for treatment, the patient has consented to  Procedure(s) with comments: BLEPHAROPLASTY upper eyelid with excess skin (Bilateral) - MAC REPAIR OF ECTROPION sutures, extensive (Bilateral) as a surgical intervention .  The patient's history has been reviewed, patient examined, no change in status, stable for surgery.  I have reviewed the patient's chart and labs.  Questions were answered to the patient's satisfaction.     Vickki Muff, Amy M

## 2016-10-23 NOTE — Anesthesia Postprocedure Evaluation (Signed)
Anesthesia Post Note  Patient: James [name]  Procedure(s) Performed: Procedure(s) (LRB): BLEPHAROPLASTY upper eyelid with excess skin (Bilateral) REPAIR OF ECTROPION sutures, extensive (Bilateral)  Patient location during evaluation: PACU Anesthesia Type: MAC Level of consciousness: awake and alert and oriented Pain management: pain level controlled Vital Signs Assessment: post-procedure vital signs reviewed and stable Respiratory status: spontaneous breathing and nonlabored ventilation Cardiovascular status: stable Postop Assessment: no signs of nausea or vomiting and adequate PO intake Anesthetic complications: no    Estill Batten

## 2016-10-23 NOTE — Anesthesia Preprocedure Evaluation (Signed)
Anesthesia Evaluation  Patient identified by MRN, date of birth, ID band Patient awake    Reviewed: Allergy & Precautions, NPO status , Patient's Chart, lab work & pertinent test results  Airway Mallampati: II  TM Distance: >3 FB Neck ROM: Full    Dental no notable dental hx.    Pulmonary former smoker,    Pulmonary exam normal        Cardiovascular hypertension, Pt. on medications and Pt. on home beta blockers Normal cardiovascular exam     Neuro/Psych    GI/Hepatic negative GI ROS, Neg liver ROS,   Endo/Other  Hypothyroidism   Renal/GU negative Renal ROS     Musculoskeletal  (+) Arthritis , Osteoarthritis,    Abdominal   Peds  Hematology   Anesthesia Other Findings   Reproductive/Obstetrics                             Anesthesia Physical Anesthesia Plan  ASA: II  Anesthesia Plan: MAC   Post-op Pain Management:    Induction: Intravenous  Airway Management Planned:   Additional Equipment:   Intra-op Plan:   Post-operative Plan:   Informed Consent: I have reviewed the patients History and Physical, chart, labs and discussed the procedure including the risks, benefits and alternatives for the proposed anesthesia with the patient or authorized representative who has indicated his/her understanding and acceptance.     Plan Discussed with: CRNA  Anesthesia Plan Comments:         Anesthesia Quick Evaluation

## 2016-10-23 NOTE — H&P (Signed)
See the history and physical completed at [facility] on 10/02/16 and scanned into the chart.

## 2016-10-23 NOTE — Transfer of Care (Signed)
Immediate Anesthesia Transfer of Care Note  Patient: James [name]  Procedure(s) Performed: Procedure(s) with comments: BLEPHAROPLASTY upper eyelid with excess skin (Bilateral) - MAC REPAIR OF ECTROPION sutures, extensive (Bilateral)  Patient Location: PACU  Anesthesia Type: MAC  Level of Consciousness: awake, alert  and patient cooperative  Airway and Oxygen Therapy: Patient Spontanous Breathing and Patient connected to supplemental oxygen  Post-op Assessment: Post-op Vital signs reviewed, Patient's Cardiovascular Status Stable, Respiratory Function Stable, Patent Airway and No signs of Nausea or vomiting  Post-op Vital Signs: Reviewed and stable  Complications: No apparent anesthesia complications

## 2016-10-23 NOTE — Op Note (Signed)
Preoperative Diagnosis:   1.  Lower eyelid laxity with entropion, bilateral lower eyelid(s). 2.  Spastic Entropion right Lower eyelid(s) 3.  Visually significant dermatochalasis bilateral Upper Eyelid(s)  Postoperative Diagnosis:   Same.  Procedure(s) Performed:  1.  Lateral tarsal strip procedure,  bilateral  lower eyelid(s). 2.  Quickert Sutures for entropion right lower eyelid(s) 3.  Upper eyelid blepharoplasty with excess skin excision  bilateral Upper Eyelid(s)  Surgeon: Philis Pique. Vickki [name], M.D.  Assistants: none  Anesthesia: MAC  Specimens: None.  Estimated Blood Loss: Minimal.  Complications: None.  Operative Findings: None   Procedure:   Allergies were reviewed and the patient Lipitor [atorvastatin calcium].    After discussing the risks, benefits, complications, and alternatives with the patient, appropriate informed consent was obtained. The patient was brought to the operating suite and reclined supine. Time out was conducted and the patient was sedated.  Local anesthetic consisting of a 50-50 mixture of 2% lidocaine with epinephrine and 0.75% bupivacaine with added Hylenex was injected subcutaneously  to both upper eyelid(s) and to the bilateral lateral canthal region(s) and lower eyelid(s). Additional anesthetic was injected subconjunctivally to the bilateral lower eyelid(s). Finally, anesthetic was injected down to the periosteum of the bilateral lateral orbital rim(s).  After adequate local was instilled, the patient was prepped and draped in the usual sterile fashion for eyelid surgery.   Attention was turned to the upper eyelids. A 15m upper eyelid crease incision line was marked with calipers on both upper eyelid(s).  A pinch test was used to estimate the amount of excess skin to remove and this was marked in standard blepharoplasty style fashion. Attention was turned to the  right upper eyelid. A #15 blade was used to open the premarked incision line. A skin and  muscle flap was excised and hemostasis was obtained with bipolar cautery.   A buttonhole was created medially in orbicularis and orbital septum to reveal the medial fat pocket. This was dissected free from fascial attachments, cauterized towards the pedicle base and excised to produce a nice flattening of the medial corner of the upper eyelid.   The incisions were closed with a combination of running and interrupted 6-0 fast absorbing plain gut suture. Attention was then turned to the opposite eyelid where the same procedure was performed in the same manner.   Attention was turned to the right lateral canthal angle. Westcott scissors were used to create a lateral canthotomy. Hemostasis was obtained with bipolar cautery. An inferior cantholysis was then performed with additional bipolar hemostasis. The anterior and posterior lamella of the lid were divided for approximately 8 mm.  A strip of the epithelium was excised off the superior margin of the tarsal strip and conjunctiva and retractors were incised off the inferior margin of the tarsal strip.   Two double-armed 4-0 chromic sutures were passed medially and laterally through the lower lid from the conjunctival fornix, anteriorly through the skin just below the border of the tarsal plate. Each of these sutures was tied off and this created a nice outward rolling of the lid margin.  A double-armed 4-0 Mersilene suture was then passed each arm through the terminal portion of the tarsal strip. Each arm of the suture was then passed through the periosteum of the inner portion of the lateral orbital rim at the level of Whitnall's tubercle. The sutures were advanced and this provided nice elevation and tightening of the lower eyelid. Once the suture was secured, a thin strip of follicle-bearing skin was  excised. The lateral canthal angle was reformed with an interrupted 6-0 fast absorbing plain suture. Orbicularis was reapproximated with horizontal  subcuticular 6-0 fast absorbing plain gut sutures. The skin was closed with interrupted 6-0 fast absorbing plain gut sutures.   Attention was then turned to the opposite eyelid where the same procedure was performed in the same manner with the exception that Quickert sutures were not placed in the left lower eyelid.   The patient tolerated the procedure well. Erythromycin ophthalmic ointment was applied to the incision site(s) followed by ice packs. The patient was taken to the recovery area where she recovered without difficulty.  Post-Op Plan/Instructions:  The patient was instructed to use ice packs frequently for the next 48 hours. She was instructed to use erythromycin ophthalmic ointment on her incisions 4 times a day for the next 12 to 14 days. She was given a prescription for Percocet for pain control should Tylenol not be effective. She was asked to to follow up at the [facility] in [place], [place] in 3-4 weeks' time or sooner as needed for problems.   James [name], M.D. Ophthalmology

## 2016-10-24 ENCOUNTER — Encounter: Payer: Self-pay | Admitting: Ophthalmology

## 2016-11-20 ENCOUNTER — Ambulatory Visit (INDEPENDENT_AMBULATORY_CARE_PROVIDER_SITE_OTHER): Payer: Medicare Other | Admitting: Family Medicine

## 2016-11-20 ENCOUNTER — Encounter: Payer: Self-pay | Admitting: Family Medicine

## 2016-11-20 DIAGNOSIS — L237 Allergic contact dermatitis due to plants, except food: Secondary | ICD-10-CM

## 2016-11-20 MED ORDER — PREDNISONE 10 MG PO TABS: ORAL_TABLET | ORAL | 0 refills | Status: DC

## 2016-11-20 MED ORDER — TRIAMCINOLONE ACETONIDE 0.1 % EX CREA: 1.0000 "application " | TOPICAL_CREAM | Freq: Two times a day (BID) | CUTANEOUS | 1 refills | Status: DC | PRN

## 2016-11-20 NOTE — Patient Instructions (Signed)
TAC cream as needed.  Use prednisone pills if worse, with food.  Take care.  Glad to see you.  Update me as needed.

## 2016-11-20 NOTE — Progress Notes (Signed)
Pre visit review using our clinic review tool, if applicable. No additional management support is needed unless otherwise documented below in the visit note. 

## 2016-11-20 NOTE — Progress Notes (Signed)
Linear rash on the forearm.  Also on the R side of the neck.  No FCNAVD.  He isn't prone to poison ivy usually and didn't have a known exposure.  No trauma.  He had been working outside some.  Using benadryl cream.    Meds, vitals, and allergies reviewed.   ROS: Per HPI unless specifically indicated in ROS section   nad R neck blotchy blanching lesions without fluctuance Linear lesion pattern on R forearm. Neither set of lesions are purulent or fluctuant or hot  rrr ctab

## 2016-11-21 DIAGNOSIS — L237 Allergic contact dermatitis due to plants, except food: Secondary | ICD-10-CM | POA: Insufficient documentation

## 2016-11-21 NOTE — Assessment & Plan Note (Signed)
Presumed. Likely. Discussed with patient. Use topical triamcinolone cream. If not improved or if worse continue steroid taper with routine cautions. Update me as needed. He agrees.

## 2017-01-29 ENCOUNTER — Ambulatory Visit (INDEPENDENT_AMBULATORY_CARE_PROVIDER_SITE_OTHER): Payer: Medicare Other | Admitting: Primary Care

## 2017-01-29 ENCOUNTER — Ambulatory Visit (INDEPENDENT_AMBULATORY_CARE_PROVIDER_SITE_OTHER)
Admission: RE | Admit: 2017-01-29 | Discharge: 2017-01-29 | Disposition: A | Discharge status: Home / Self Care | Payer: Medicare Other | Source: Ambulatory Visit | Attending: Primary Care | Admitting: Primary Care

## 2017-01-29 ENCOUNTER — Encounter: Payer: Self-pay | Admitting: Primary Care

## 2017-01-29 ENCOUNTER — Other Ambulatory Visit: Payer: Self-pay | Admitting: Primary Care

## 2017-01-29 VITALS — BP 130/94 | HR 64 | Temp 97.6°F | Wt 276.8 lb

## 2017-01-29 DIAGNOSIS — M25562 Pain in left knee: Principal | ICD-10-CM

## 2017-01-29 DIAGNOSIS — M25561 Pain in right knee: Principal | ICD-10-CM

## 2017-01-29 DIAGNOSIS — G8929 Other chronic pain: Secondary | ICD-10-CM

## 2017-01-29 IMAGING — DX DG KNEE COMPLETE 4+V*L*
4 series · 4 of 4 positions shown · non-contrast
Comparison: None.

CLINICAL DATA: Chronic left knee pain

EXAM:
LEFT KNEE - COMPLETE 4+ VIEW

[knee ap (1 of 2)]
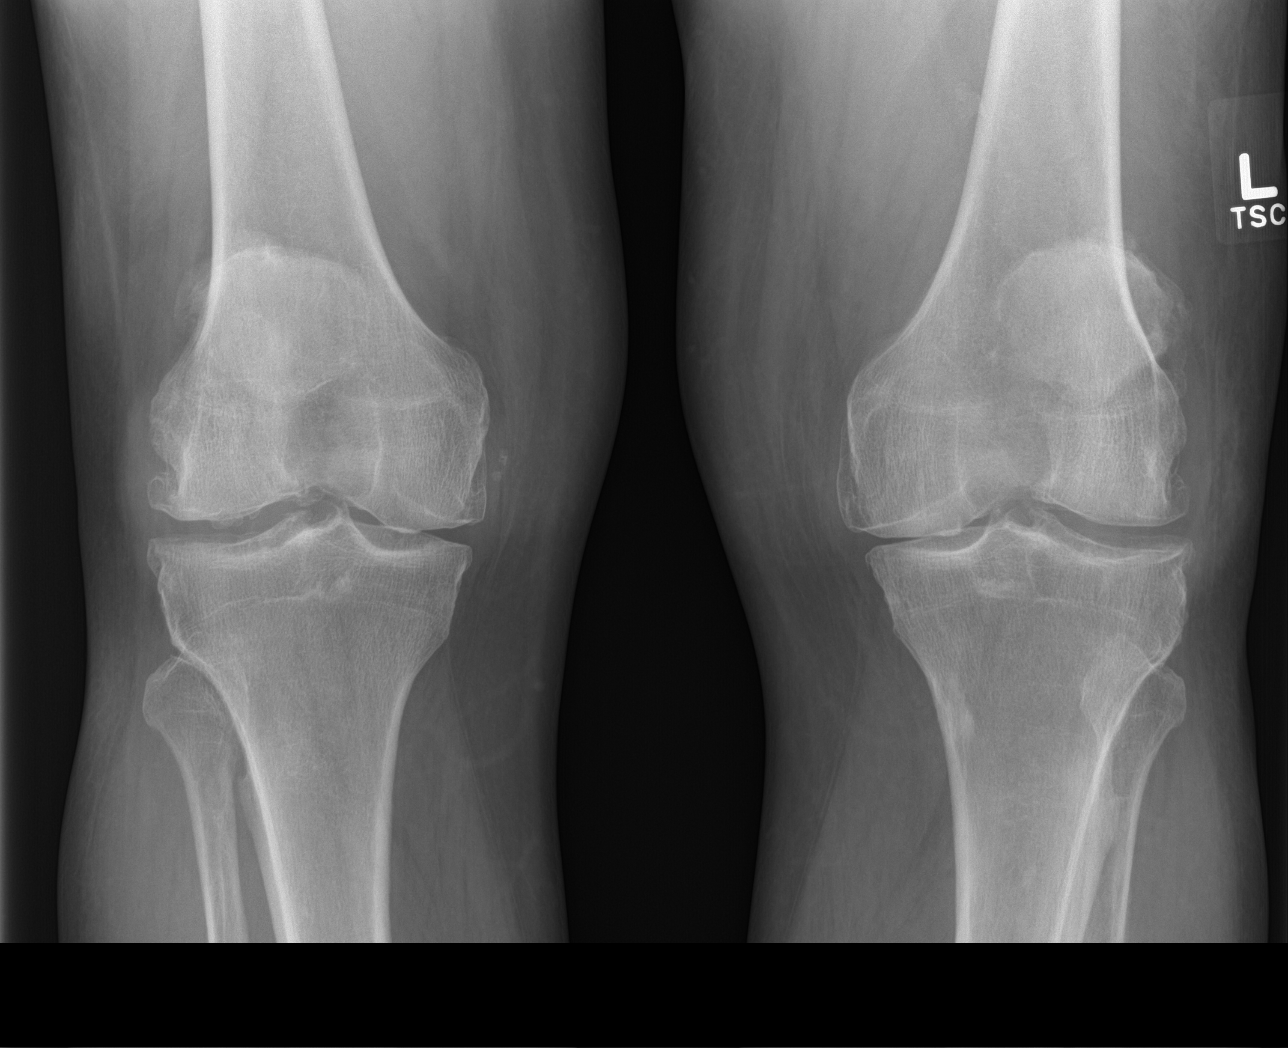

[knee lat]
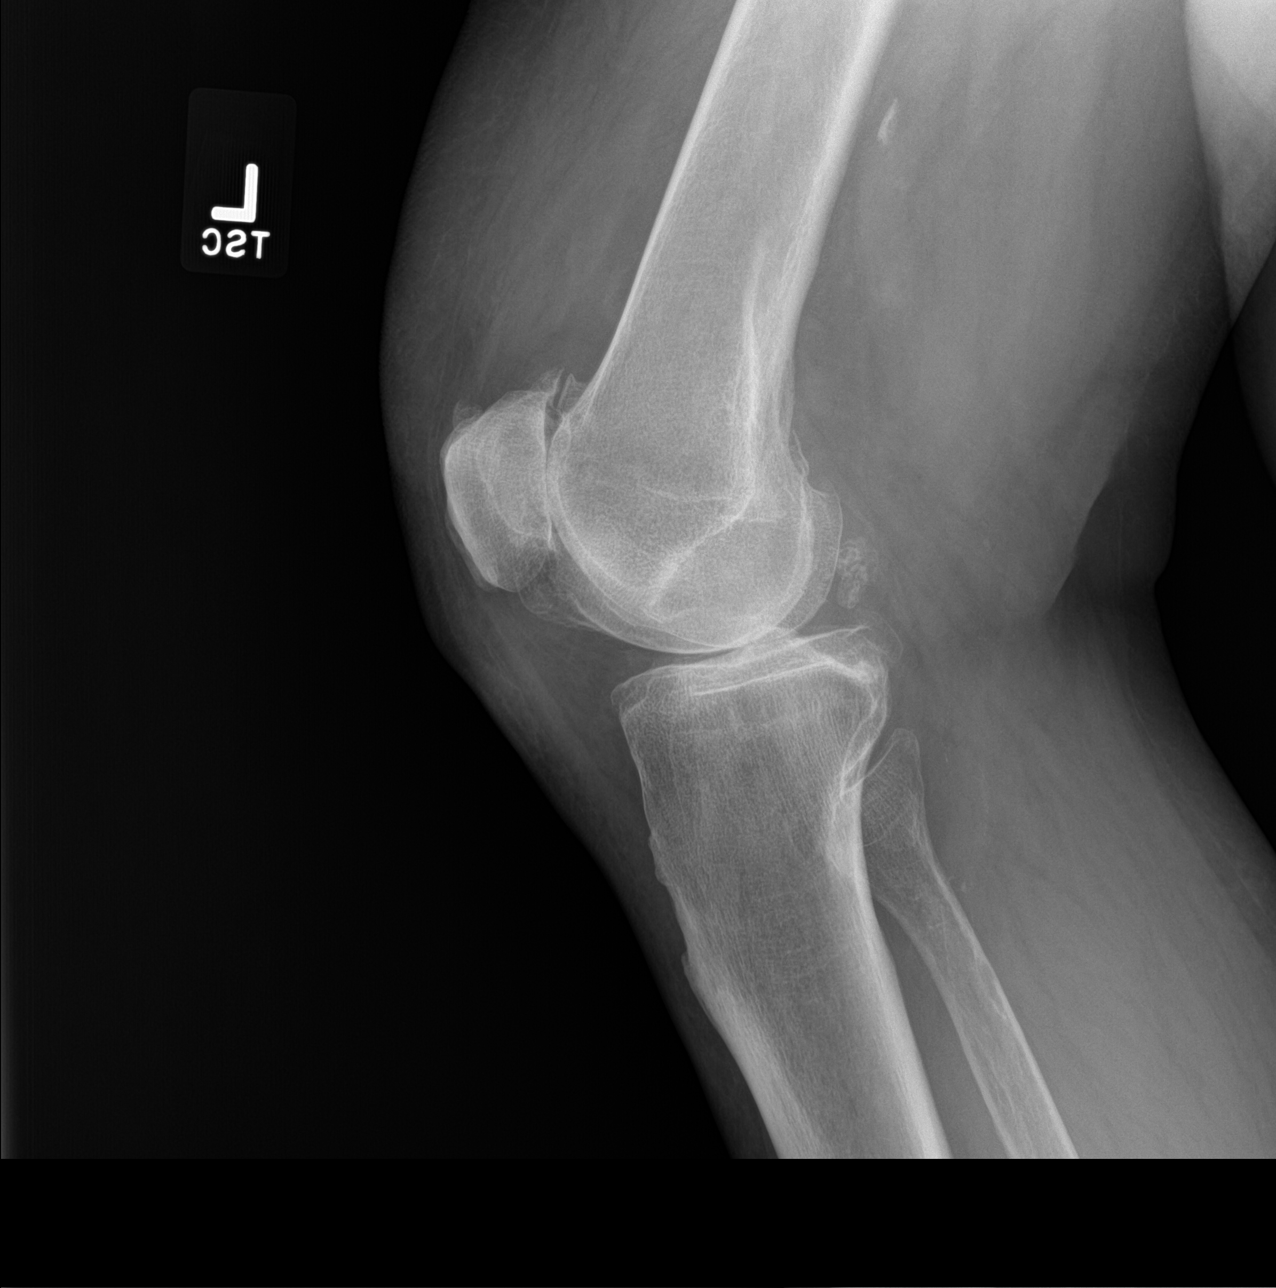

[patella skyline]
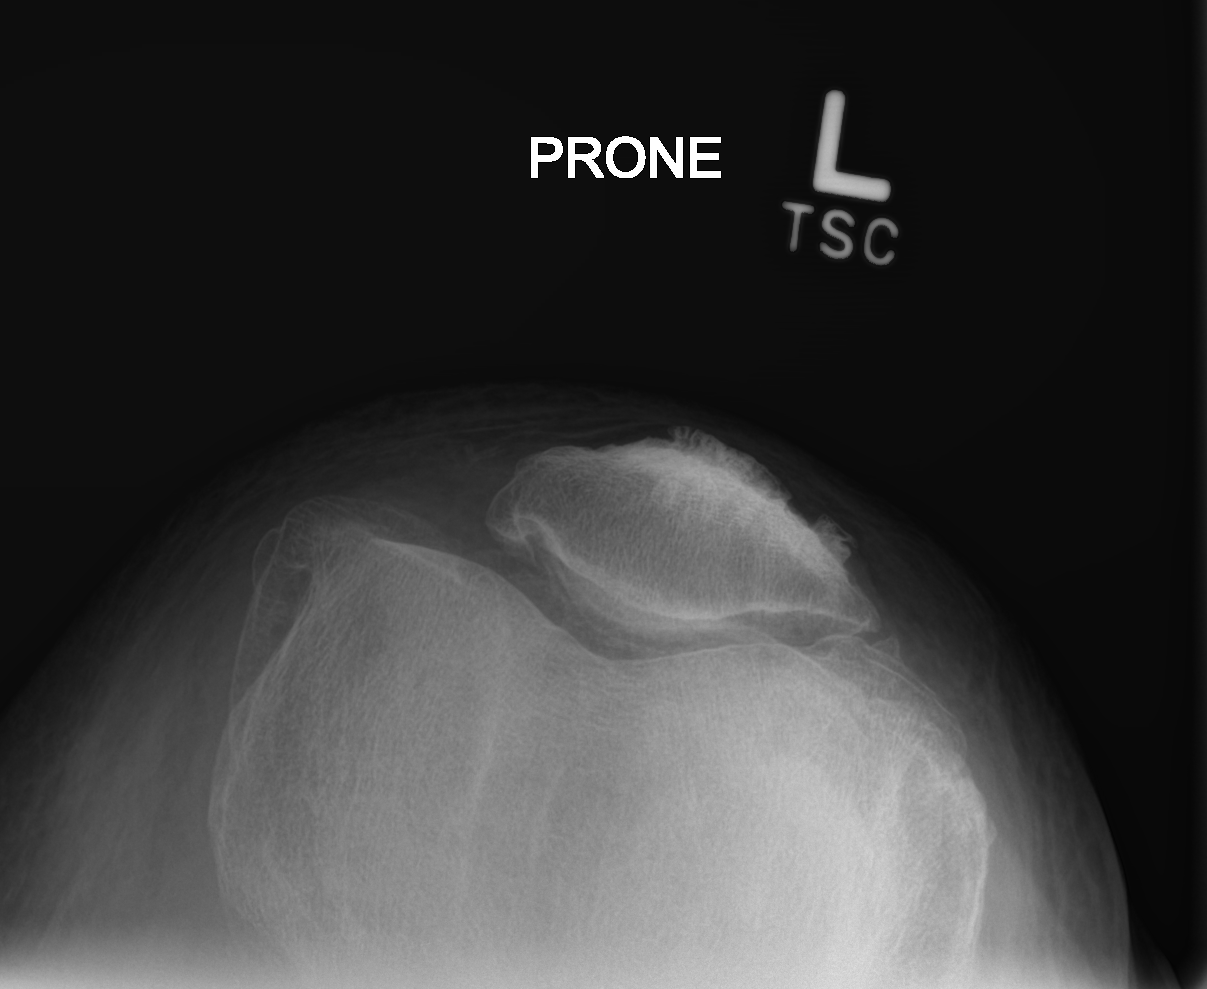

[knee ap (2 of 2)]
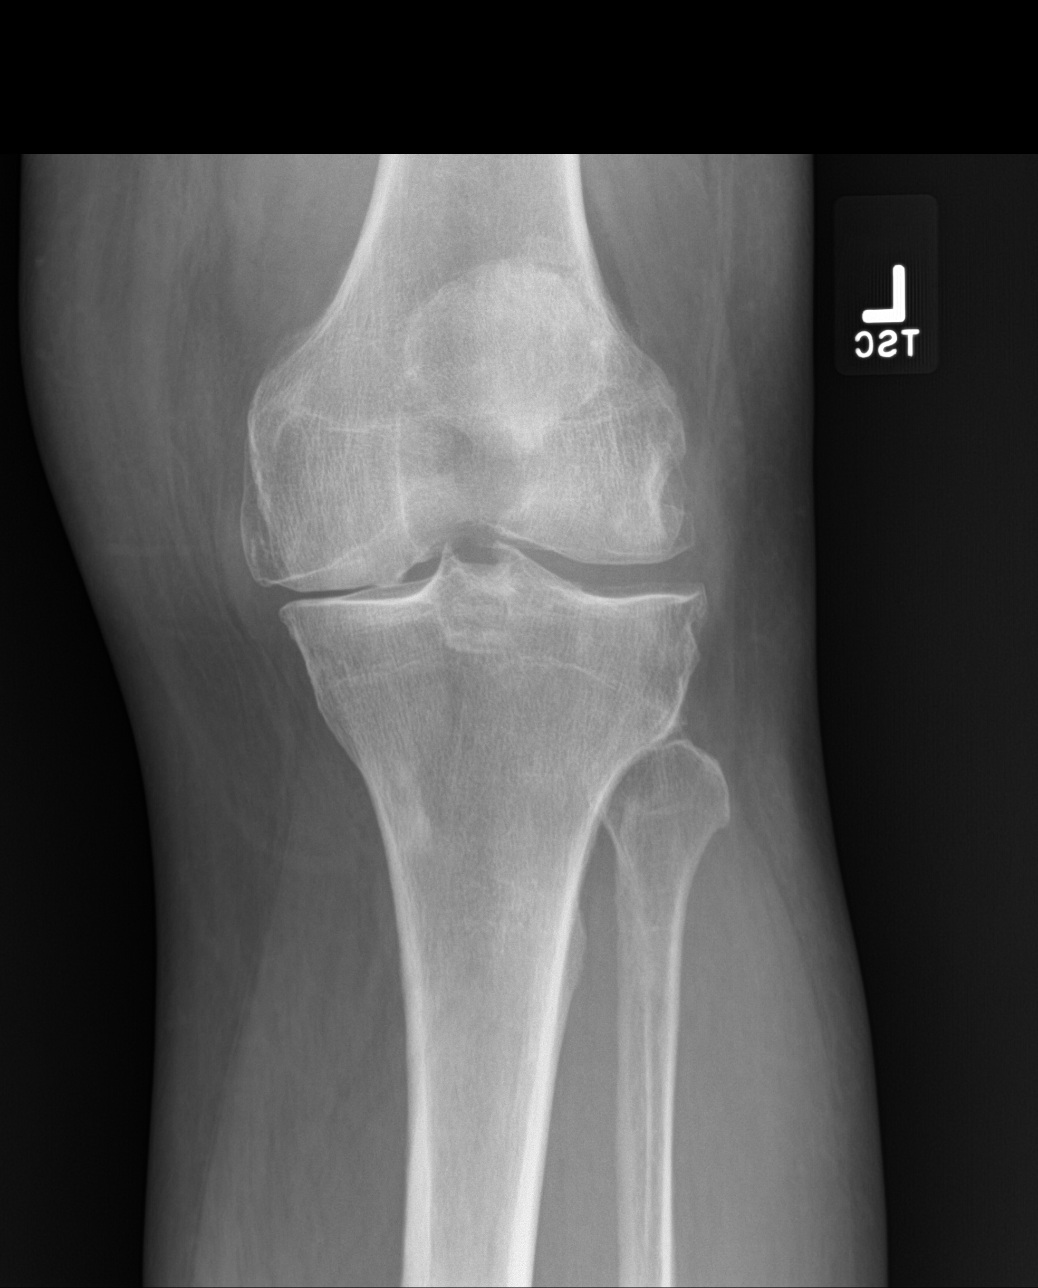

[4 of 4 positions shown; findings below may reference images not displayed]

FINDINGS: Advanced tricompartment osteoarthritic change. No acute fracture. No
dislocation. There is varus deformity at the knee. Minimal vascular
calcification in the distal superficial femoral artery.
IMPRESSION: No acute bony pathology.  Degenerative change.

## 2017-01-29 NOTE — Progress Notes (Signed)
Subjective:    Patient ID: James [name], male    DOB: 09/25/1945, 71 y.o.   MRN: [id number]  HPI  James [name] is a 71 year old male with a history of chronic knee pain and swelling who presents today with a chief complaint of knee pain. His pain is located to the bottom anterior part of the left knee. His recent knee pain and swelling began 1 month ago with intermittent symptoms of swelling and pain. He felt better yesterday so he attempted to play tennis. During his warm up he noticed his pain return to the anterior lower patellar joint with swelling so he had to stop. He's been taking Ibuprofen 600 mg TID with some improvement. He has an elastic knee sleeve that he'll wear occasionally when he experiences increased pain.   He denies recent injury/trauma, numbness/tingling, weakness.   Review of Systems  Musculoskeletal: Positive for arthralgias and joint swelling.  Skin: Negative for color change.  Neurological: Negative for weakness and numbness.       Past Medical History:  Diagnosis Date  . Acute lower GI bleeding after colonoscopy and polypectomy, resolved 12/12/2015  . Cataract    bilateral surgery to remove  . Colon polyp   . Constipation   . Hemorrhoids   . Hepatitis    self resolved, likely food exposure, 1974.   Marland Kitchen Hx of adenomatous colonic polyps 12/05/2015  . Hyperlipidemia   . Hypertension   . Hypothyroidism   . Skin cancer    basal cell, R ear, MOHS     Social History   Social History  . Marital status: Married    Spouse name: N/A  . Number of children: 1  . Years of education: N/A   Occupational History  . Retired Retired    1   Social History Main Topics  . Smoking status: Former Smoker    Packs/day: 2.00    Years: 20.00    Types: Cigarettes    Quit date: 08/07/1983  . Smokeless tobacco: Never Used     Comment: quit over 30 years ago  . Alcohol use 4.2 oz/week    7 Shots of liquor per week     Comment: a drink every evening, scotch or whiskey  .  Drug use: No  . Sexual activity: Not on file   Other Topics Concern  . Not on file   Social History Narrative   Married and lives with wife 1974   One daughter, not local   Retired from [employer], sea based telecommunication/contracting    Past Surgical History:  Procedure Laterality Date  . BROW LIFT Bilateral 10/23/2016   Procedure: BLEPHAROPLASTY upper eyelid with excess skin;  Surgeon: Karle Starch, MD;  Location: [facility];  Service: Ophthalmology;  Laterality: Bilateral;  MAC  . CATARACT EXTRACTION  1986   OD  . CATARACT EXTRACTION Bilateral 1995   x 2 for right and left   . COLONOSCOPY    . ECTROPION REPAIR Bilateral 10/23/2016   Procedure: REPAIR OF ECTROPION sutures, extensive;  Surgeon: Karle Starch, MD;  Location: [facility];  Service: Ophthalmology;  Laterality: Bilateral;  . LIPOMA EXCISION  06/1997   left neck ([place])  . MOHS SURGERY Right 2013   behind right ear  . TONSILLECTOMY    . VASECTOMY  1982    Family History  Problem Relation Age of Onset  . Heart disease Paternal Grandfather        MI, old age  .  Stroke Mother   . Lung cancer Maternal Grandfather        smoker  . Stroke Maternal Grandfather   . Colon cancer Neg Hx   . Colon polyps Neg Hx   . Esophageal cancer Neg Hx   . Rectal cancer Neg Hx   . Stomach cancer Neg Hx   . Bladder Cancer Neg Hx   . Prostate cancer Neg Hx     Allergies  Allergen Reactions  . Sulfonamide Derivatives Rash    REACTION: UNKNOWN AN CHILD    Current Outpatient Prescriptions on File Prior to Visit  Medication Sig Dispense Refill  . aspirin 81 MG tablet Take 81 mg by mouth daily.    Marland Kitchen doxazosin (CARDURA) 4 MG tablet Take 1 tablet (4 mg total) by mouth daily. 90 tablet 3  . finasteride (PROSCAR) 5 MG tablet Take 1 tablet (5 mg total) by mouth daily. 90 tablet 3  . levothyroxine (SYNTHROID, LEVOTHROID) 150 MCG tablet TAKE 1 TABLET ONCE DAILY 90 tablet 3  . metoprolol succinate (TOPROL XL) 25 MG 24  hr tablet Take 1 tablet (25 mg total) by mouth daily. 90 tablet 3  . Multiple Vitamin (MULTIVITAMIN) tablet Take 1 tablet by mouth daily.      . Omega-3 Fatty Acids (FISH OIL) 1000 MG CAPS Take by mouth.    . psyllium (METAMUCIL) 58.6 % powder Take 2 teaspoons with water daily.    . ramipril (ALTACE) 10 MG capsule Take 1 capsule (10 mg total) by mouth daily. 90 capsule 3  . simvastatin (ZOCOR) 40 MG tablet Take 1 tablet (40 mg total) by mouth at bedtime. 90 tablet 3   No current facility-administered medications on file prior to visit.     BP (!) 130/94   Pulse 64   Temp 97.6 F (36.4 C) (Oral)   Wt 276 lb 12.8 oz (125.6 kg)   SpO2 99%   BMI 37.54 kg/m    Objective:   Physical Exam  Constitutional: He appears well-nourished.  Neck: Neck supple.  Cardiovascular: Normal rate.   Pulmonary/Chest: Effort normal.  Musculoskeletal:       Left knee: He exhibits decreased range of motion and swelling. He exhibits no erythema and no bony tenderness.       Legs: Negative Lachman and Drawer tests.  Skin: Skin is warm and dry. No erythema.          Assessment & Plan:  Acute on Chronic Knee Pain:  Located to left lower knee intermittently for years, recent bout over the last 1 month. Exam today with mild swelling and mild decrease in ROM. No deformity or laxity in joints. Could be arthritis. Check xray today.  Discussed potential for PT vs Sports Medicine evaluation based off of xray results. Continue NSAID's PRN. Continue wearing brace for support. Wait for xray results.  Sheral Flow, NP

## 2017-01-29 NOTE — Patient Instructions (Signed)
Complete xray(s) prior to leaving today.   Continue Ibuprofen 600 mg three times daily as needed.  Wear the knee brace during the day for stability.  We may consider physical therapy vs Sports Medicine referral based off of the xray results. We will be in touch once we receive the results.  It was a pleasure meeting you!

## 2017-02-05 ENCOUNTER — Ambulatory Visit: Payer: Medicare Other | Attending: Primary Care

## 2017-02-05 DIAGNOSIS — R262 Difficulty in walking, not elsewhere classified: Secondary | ICD-10-CM | POA: Insufficient documentation

## 2017-02-05 DIAGNOSIS — G8929 Other chronic pain: Secondary | ICD-10-CM | POA: Diagnosis not present

## 2017-02-05 DIAGNOSIS — M25561 Pain in right knee: Secondary | ICD-10-CM | POA: Diagnosis not present

## 2017-02-05 DIAGNOSIS — M25562 Pain in left knee: Secondary | ICD-10-CM | POA: Diagnosis not present

## 2017-02-05 DIAGNOSIS — M6281 Muscle weakness (generalized): Secondary | ICD-10-CM | POA: Diagnosis not present

## 2017-02-05 NOTE — Patient Instructions (Signed)
Abduction: Side Leg Lift (Eccentric) - Side-Lying    Lie on side. Lift top leg slightly higher than shoulder level. Keep top leg straight with body, toes pointing forward.  __10_ reps per set, __3_ sets per day, _5__ days per week. Perform for both sides.   http://ecce.exer.us/62   Copyright  VHI. All rights reserved.

## 2017-02-05 NOTE — Therapy (Signed)
[facility] [address] [place], [address] Phone: [phone]   Fax:  [phone]  Physical Therapy Evaluation  Patient Details  Name: James [name] MRN: [id number] Date of Birth: 10/01/1945 Referring Provider: Pleas Koch, NP  Encounter Date: 02/05/2017      PT End of Session - 02/05/17 1645    Visit Number 1   Number of Visits 13   Date for PT Re-Evaluation 03/21/17   Authorization Type 1    Authorization Time Period of 10 g-code   PT Start Time 1645   PT Stop Time 1755   PT Time Calculation (min) 70 min   Activity Tolerance Patient tolerated treatment well   Behavior During Therapy [facility] for tasks assessed/performed      Past Medical History:  Diagnosis Date  . Acute lower GI bleeding after colonoscopy and polypectomy, resolved 12/12/2015  . Cataract    bilateral surgery to remove  . Colon polyp   . Constipation   . Hemorrhoids   . Hepatitis    self resolved, likely food exposure, 1974.   Marland Kitchen Hx of adenomatous colonic polyps 12/05/2015  . Hyperlipidemia   . Hypertension   . Hypothyroidism   . Skin cancer    basal cell, R ear, MOHS    Past Surgical History:  Procedure Laterality Date  . BROW LIFT Bilateral 10/23/2016   Procedure: BLEPHAROPLASTY upper eyelid with excess skin;  Surgeon: Karle Starch, MD;  Location: [place];  Service: Ophthalmology;  Laterality: Bilateral;  MAC  . CATARACT EXTRACTION  1986   OD  . CATARACT EXTRACTION Bilateral 1995   x 2 for right and left   . COLONOSCOPY    . ECTROPION REPAIR Bilateral 10/23/2016   Procedure: REPAIR OF ECTROPION sutures, extensive;  Surgeon: Karle Starch, MD;  Location: [place];  Service: Ophthalmology;  Laterality: Bilateral;  . LIPOMA EXCISION  06/1997   left neck (Juengel)  . MOHS SURGERY Right 2013   behind right ear  . TONSILLECTOMY    . VASECTOMY  1982    There were no vitals filed for this  visit.       Subjective Assessment - 02/05/17 1652    Subjective L knee pain 0/10 (pt is sitting on a chair; 1/10 L knee pain with gait; pt took 600 mg ibuprofen lunchtime), 6/10 L knee pain at most for the past month (pt would wake up in the morning at times with knee pain).   R knee pain 0/10 currently, 3/10 R knee pain at most for the past month.   Pertinent History Bilateral knee pain. Pt states having pain in his L knee currently. Has pain in his R knee at times. Has always had knee problems due to tennis. Pain worsened during the spring of 2018. Has always taken ibuprofen  which helps but takes a higher dose now due to increased pain. Denies locking sensation bilateral knees.    Patient Stated Goals I'd love to get back to playing tennis, improve mobility (being able to go up and down from the floor or chair easier), be able to load and unload his kayak, get into and out of his kayak.    Currently in Pain? Yes   Pain Score 1   L knee with gait   Pain Location Knee   Pain Orientation Left;Right  L > R knee joint line and posterior knee muscles   Pain Descriptors / Indicators Aching;[place] Ard  Pain Type Chronic pain   Pain Onset More than a month ago   Pain Frequency Occasional   Aggravating Factors  walking on uneven ground, playing tennis (side to side movement), floor to stand transfers, sit <> stand transfers, going up and down steps (greater than 5-6 steps with one rail assist)   Pain Relieving Factors walking with his L knee locked in extension, ibuprofen, rubbing his knee.             [facility] PT Assessment - 02/05/17 1636      Assessment   Medical Diagnosis Chronic pain of both knees   Referring Provider Pleas Koch, NP   Onset Date/Surgical Date 01/29/17  Date PT referral signed. Chronic condition   Next MD Visit none scheduled   Prior Therapy Has not had PT for his knees before      Precautions   Precaution Comments no known precautions     Restrictions   Other  Position/Activity Restrictions no known restrictions     Balance Screen   Has the patient fallen in the past 6 months No   Has the patient had a decrease in activity level because of a fear of falling?  No   Is the patient reluctant to leave their home because of a fear of falling?  No     Home Environment   Additional Comments Pt lives in a 1 story home with his wife, no stairs.      Prior Function   Vocation Retired   Biomedical scientist PLOF: better able to perform sit <> stand, floor <> stand transfers, walk on uneven ground, participate in activities such as Designer, television/film set, tennis     Observation/Other Assessments   Observations Step down test bilateral LE reveals decreased femoral control    Lower Extremity Functional Scale  44/80     Posture/Postural Control   Posture Comments bilaterally protracted shoulders and neck, R lateral shift, decreased bilateral hip extension, L leg in ER, bilateral foot prontation     AROM   Overall AROM Comments no reproduction of knee pain with lumbar AROM. Side bending and rotation performed in sitting.      PROM   Overall PROM Comments Prone knee flexion: bilateral quadriceps tightness L > R. WFL but L knee flexion limited compared to R knee. hips at 90/90: R hip IR limited compared to L hip     Strength   Right Hip Extension 4-/5   Right Hip External Rotation  4/5   Right Hip ABduction 4/5   Left Hip Extension 4-/5   Left Hip External Rotation 4/5   Left Hip ABduction 4/5   Right Knee Flexion 4/5   Right Knee Extension 5/5   Left Knee Flexion 4/5   Left Knee Extension 5/5     Palpation   Palpation comment Reproduced joint line pain with manual L knee extension with medial and lateral tibial rotation. Reproduced L knee pain with flexion and medial tibial rotation.      Ambulation/Gait   Gait Comments Decreased stance L LE, contralateral pelvic drop during stance phase of gait, decreased bilateral hip  extension            Objective measurements completed on examination: See above findings.   supine L knee extension with ER of tibia bothers knee > than extension with medial tibial rotation     Manual therapy  Supine medial tibial rotation mobilization grade  1 to 3- to 3.  Discomfort with grade 3. No discomfort with lower grades.   No change in symptoms  with knee extension with ER tibial rotation in supine   There-ex  Directed patient with S/L L hip abduction 10x2   Review and given exercise as part of his HEP. Pt demonstrated and verbalized understanding. Handout provided.   Improved exercise technique, movement at target joints, use of target muscles after mod verbal, visual, tactile cues.           PT Education - 02/05/17 [place]    Education provided Yes   Education Details ther-ex, HEP, plan of care   Person(s) Educated Patient   Methods Explanation;Demonstration;Tactile cues;Verbal cues;Handout   Comprehension Returned demonstration;Verbalized understanding             PT Long Term Goals - 02/05/17 1812      PT LONG TERM GOAL #1   Title Patient will have a decrease in L knee pain to 3/10 or less at most to promote ability to walk on uneven ground, perform transfers, participate in activities such as tennis and kayaking.    Baseline 6/10 L knee pain at most for the past month (takes ibuprofen), 02/05/2017   Time 6   Period Weeks   Status New     PT LONG TERM GOAL #2   Title Patient will improve bilateral LE strength by at least 1/2 MMT grade to promote ability to perform functional tasks with less bilateral knee pain.    Time 6   Period Weeks   Status New     PT LONG TERM GOAL #3   Title Patient will improve his LEFS score by at least 9 points as a demonstration of improved function.    Baseline 44/80 (02/05/2017)   Time 6   Period Weeks   Status New     PT LONG TERM GOAL #4   Title Pt will report being able to play at least 1 game of tennis  with 3/10 L knee pain or less to promote ability to participate in recreational activities.    Baseline Unable to play tennis due to knee pain; 6/10 at most (02/05/2017)   Time 6   Period Weeks   Status New                Plan - 02/05/17 1802    Clinical Impression Statement Patient is a 72 year old male who came to physical therapy secondary to bilateral knee pain L > R. He also presents with reproduction of symptoms with L knee extension with medial and lateral tibial rotation; bilateral hip weakness, decreased femoral control, altered gait pattern and posture, and difficulty with performing functional task such as walking on uneven ground, floor <> stand and sit <> stand transfers, and stair negotiation. Pt will benefit from skilled physical therapy services to address the aforementioned deficits.    History and Personal Factors relevant to plan of care: Chronicity of condition   Clinical Presentation Evolving   Clinical Presentation due to: decreased L knee pain with ibuprofen but at a higher dose due to worsening L knee pain   Clinical Decision Making Low   Rehab Potential Good   Clinical Impairments Affecting Rehab Potential (+) chronicity of condition; (+) motivated, active   PT Frequency 2x / week   PT Duration 6 weeks   PT Treatment/Interventions Therapeutic exercise;Therapeutic activities;Manual techniques;Aquatic Therapy;Electrical Stimulation;Iontophoresis 47m/ml Dexamethasone;Ultrasound;Neuromuscular re-education;Patient/family education;Dry needling   PT Next Visit Plan hip strengthening, femoral control, manual therapy and modalities PRN  Consulted and Agree with Plan of Care Patient      Patient will benefit from skilled therapeutic intervention in order to improve the following deficits and impairments:  Pain, Improper body mechanics, Difficulty walking, Decreased strength  Visit Diagnosis: Chronic pain of left knee - Plan: PT plan of care cert/re-cert  Chronic  pain of right knee - Plan: PT plan of care cert/re-cert  Muscle weakness (generalized) - Plan: PT plan of care cert/re-cert  Difficulty in walking, not elsewhere classified - Plan: PT plan of care cert/re-cert      G-Codes - 61/95/09 1821    Functional Assessment Tool Used (Outpatient Only) LEFS, clinical presentation, patient interview   Functional Limitation Mobility: Walking and moving around   Mobility: Walking and Moving Around Current Status (T2671) At least 40 percent but less than 60 percent impaired, limited or restricted   Mobility: Walking and Moving Around Goal Status [id number]) At least 20 percent but less than 40 percent impaired, limited or restricted       Problem List Patient Active Problem List   Diagnosis Date Noted  . Poison ivy 11/21/2016  . Gross hematuria 05/01/2016  . Weak urinary stream 05/01/2016  . Prostate cancer screening 05/01/2016  . Hematuria 03/06/2016  . Hx of adenomatous colonic polyps 12/05/2015  . Advance care planning 07/21/2014  . Medicare annual wellness visit, subsequent 07/01/2012  . External hemorrhoids 06/27/2011  . Hypothyroidism 02/24/2008  . HYPERCHOLESTEROLEMIA 02/21/2007  . HYPERTENSION, BENIGN ESSENTIAL 02/21/2007  . DEGENERATIVE JOINT DISEASE, CERVICAL SPINE 02/21/2007    Joneen Boers PT, DPT   02/05/2017, 6:42 PM  [facility] [facility] [address] [place], [address] Phone: [phone]   Fax:  [phone]  Name: GARRISON MICHIE MRN: [id number] Date of Birth: 04/05/1946

## 2017-02-07 ENCOUNTER — Ambulatory Visit: Payer: Medicare Other

## 2017-02-07 DIAGNOSIS — M6281 Muscle weakness (generalized): Secondary | ICD-10-CM

## 2017-02-07 DIAGNOSIS — M25562 Pain in left knee: Secondary | ICD-10-CM | POA: Diagnosis not present

## 2017-02-07 DIAGNOSIS — R262 Difficulty in walking, not elsewhere classified: Secondary | ICD-10-CM

## 2017-02-07 DIAGNOSIS — M25561 Pain in right knee: Secondary | ICD-10-CM

## 2017-02-07 DIAGNOSIS — G8929 Other chronic pain: Secondary | ICD-10-CM

## 2017-02-07 NOTE — Therapy (Signed)
[facility] [address], [place], [address] Phone: [phone]   Fax:  [phone]  Physical Therapy Treatment  Patient Details  Name: James [name] MRN: [id number] Date of Birth: [birth year] Referring Provider: Pleas Koch, NP  Encounter Date: 02/07/2017      PT End of Session - 02/07/17 1338    Visit Number 2   Number of Visits 13   Date for PT Re-Evaluation 03/21/17   Authorization Type 2   Authorization Time Period of 10 g-code   PT Start Time 1338   PT Stop Time 1423   PT Time Calculation (min) 45 min   Activity Tolerance Patient tolerated treatment well   Behavior During Therapy [facility] for tasks assessed/performed      Past Medical History:  Diagnosis Date  . Acute lower GI bleeding after colonoscopy and polypectomy, resolved 12/12/2015  . Cataract    bilateral surgery to remove  . Colon polyp   . Constipation   . Hemorrhoids   . Hepatitis    self resolved, likely food exposure, 1974.   Marland Kitchen Hx of adenomatous colonic polyps 12/05/2015  . Hyperlipidemia   . Hypertension   . Hypothyroidism   . Skin cancer    basal cell, R ear, MOHS    Past Surgical History:  Procedure Laterality Date  . BROW LIFT Bilateral 10/23/2016   Procedure: BLEPHAROPLASTY upper eyelid with excess skin;  Surgeon: Karle Starch, MD;  Location: [place];  Service: Ophthalmology;  Laterality: Bilateral;  MAC  . CATARACT EXTRACTION  1986   OD  . CATARACT EXTRACTION Bilateral 1995   x 2 for right and left   . COLONOSCOPY    . ECTROPION REPAIR Bilateral 10/23/2016   Procedure: REPAIR OF ECTROPION sutures, extensive;  Surgeon: Karle Starch, MD;  Location: [place];  Service: Ophthalmology;  Laterality: Bilateral;  . LIPOMA EXCISION  06/1997   left neck (Juengel)  . MOHS SURGERY Right 2013   behind right ear  . TONSILLECTOMY    . VASECTOMY  1982    There were no vitals filed for this visit.      Subjective Assessment - 02/07/17 1339    Subjective Pt states cutting back on his ibuprofen just a little bit. L knee is a little bit of pain today. 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 2/10 L knee pain with gait from waiting room to treatment room, more at the medial knee.    Pertinent History Bilateral knee pain. Pt states having pain in his L knee currently. Has pain in his R knee at times. Has always had knee problems due to tennis. Pain worsened during the spring of 2018. Has always taken ibuprofen  which helps but takes a higher dose now due to increased pain. Denies locking sensation bilateral knees.    Patient Stated Goals I'd love to get back to playing tennis, improve mobility (being able to go up and down from the floor or chair easier), be able to load and unload his kayak, get into and out of his kayak.    Currently in Pain? Yes   Pain Score 2    Pain Onset More than a month ago                                 PT Education - 02/07/17 1412    Education  provided Yes   Education Details ther-ex, HEP   Person(s) Educated Patient   Methods Explanation;Demonstration;Tactile cues;Verbal cues;Handout   Comprehension Verbalized understanding;Returned demonstration       Objectives   Manual therapy  Supine medial tibial rotation mobilization grade  1 to 3-  Decreased knee discomfort with gait afterwards  Supine STM L lateral hamstrings  Supine medial glide to L patella grade 3  No L medial knee pain with gait    There-ex   Gait throughout session to test effectiveness of treatment  Seated L knee flexion targeting the medial hamstrings 10x3  Seated hip adduction ball squeeze 10x3 with 5 second holds with glute max squeeze  SLS on L LE with opposite tip toe assist, emphasis on pelvic control 10x5 seconds  Then SLS 10x2 with 5 second holds with bilateral UE assist to promote glute med strengthening.   Standing ankle DF/PF on  rocker board to promtoe gastroc flexibility  Side stepping 32 ft to the L and 32 ft to the R. Slight L medial knee twinge when side stepping to the R.    Seated hip adduction ball squeeze with glute max squeeze 10x5 seconds again  No L knee twinge with gait afterwards.    Reviewed HEP. Pt demonstrated and verbalized understanding, handout provided.    Improved exercise technique, movement at target joints, use of target muscles after mod verbal, visual, tactile cues.    Decreased L medial knee pain following manual therapy to promote gentle IR of tibia, medial patellar glide and ther-ex to promote VMO/glute muscle use.         PT Long Term Goals - 02/05/17 1812      PT LONG TERM GOAL #1   Title Patient will have a decrease in L knee pain to 3/10 or less at most to promote ability to walk on uneven ground, perform transfers, participate in activities such as tennis and kayaking.    Baseline 6/10 L knee pain at most for the past month (takes ibuprofen), 02/05/2017   Time 6   Period Weeks   Status New     PT LONG TERM GOAL #2   Title Patient will improve bilateral LE strength by at least 1/2 MMT grade to promote ability to perform functional tasks with less bilateral knee pain.    Time 6   Period Weeks   Status New     PT LONG TERM GOAL #3   Title Patient will improve his LEFS score by at least 9 points as a demonstration of improved function.    Baseline 44/80 (02/05/2017)   Time 6   Period Weeks   Status New     PT LONG TERM GOAL #4   Title Pt will report being able to play at least 1 game of tennis with 3/10 L knee pain or less to promote ability to participate in recreational activities.    Baseline Unable to play tennis due to knee pain; 6/10 at most (02/05/2017)   Time 6   Period Weeks   Status New               Plan - 02/07/17 1337    Clinical Impression Statement Decreased L medial knee pain following manual therapy to promote gentle IR of tibia, medial  patellar glide and ther-ex to promote VMO/glute muscle use.    History and Personal Factors relevant to plan of care: Chronicity of condition   Clinical Presentation Stable   Clinical Presentation due to: decreased L  knee pain after today's treatment   Clinical Decision Making Low   Rehab Potential Good   Clinical Impairments Affecting Rehab Potential (+) chronicity of condition; (+) motivated, active   PT Frequency 2x / week   PT Duration 6 weeks   PT Treatment/Interventions Therapeutic exercise;Therapeutic activities;Manual techniques;Aquatic Therapy;Electrical Stimulation;Iontophoresis 29m/ml Dexamethasone;Ultrasound;Neuromuscular re-education;Patient/family education;Dry needling   PT Next Visit Plan hip strengthening, femoral control, manual therapy and modalities PRN   Consulted and Agree with Plan of Care Patient      Patient will benefit from skilled therapeutic intervention in order to improve the following deficits and impairments:  Pain, Improper body mechanics, Difficulty walking, Decreased strength  Visit Diagnosis: Chronic pain of left knee  Chronic pain of right knee  Muscle weakness (generalized)  Difficulty in walking, not elsewhere classified     Problem List Patient Active Problem List   Diagnosis Date Noted  . Poison ivy 11/21/2016  . Gross hematuria 05/01/2016  . Weak urinary stream 05/01/2016  . Prostate cancer screening 05/01/2016  . Hematuria 03/06/2016  . Hx of adenomatous colonic polyps 12/05/2015  . Advance care planning 07/21/2014  . Medicare annual wellness visit, subsequent 07/01/2012  . External hemorrhoids 06/27/2011  . Hypothyroidism 02/24/2008  . HYPERCHOLESTEROLEMIA 02/21/2007  . HYPERTENSION, BENIGN ESSENTIAL 02/21/2007  . DEGENERATIVE JOINT DISEASE, CERVICAL SPINE 02/21/2007    MJoneen BoersPT, DPT  02/07/2017, 7:51 PM  [facility] [address] [place]  [address]Phone: [phone]  Fax:  [phone] Name: James DROSTMRN: [id number] of Birth: 1Jan 19, 1947

## 2017-02-07 NOTE — Patient Instructions (Signed)
  Adduction: Hip - Knees Together (Sitting)   Sit with folded pillows between knees. Push knees together. Hold for _5__ seconds. Repeat _10__ times. Do __3_ times a day.  Copyright  VHI. All rights reserved.

## 2017-02-11 ENCOUNTER — Ambulatory Visit: Payer: Medicare Other

## 2017-02-11 DIAGNOSIS — R262 Difficulty in walking, not elsewhere classified: Secondary | ICD-10-CM

## 2017-02-11 DIAGNOSIS — M25562 Pain in left knee: Principal | ICD-10-CM

## 2017-02-11 DIAGNOSIS — G8929 Other chronic pain: Secondary | ICD-10-CM

## 2017-02-11 DIAGNOSIS — M25561 Pain in right knee: Secondary | ICD-10-CM

## 2017-02-11 DIAGNOSIS — M6281 Muscle weakness (generalized): Secondary | ICD-10-CM

## 2017-02-11 NOTE — Therapy (Signed)
[facility] [address], [place], [address] Phone: [phone]   Fax:  [phone]  Physical Therapy Treatment  Patient Details  Name: James [name] MRN: [id number] Date of Birth: 1945/12/05 Referring Provider: Pleas Koch, NP  Encounter Date: 02/11/2017      PT End of Session - 02/11/17 1743    Visit Number 3   Number of Visits 13   Date for PT Re-Evaluation 03/21/17   Authorization Type 3   Authorization Time Period of 10 g-code   PT Start Time 1093   PT Stop Time 1830   PT Time Calculation (min) 47 min   Activity Tolerance Patient tolerated treatment well   Behavior During Therapy [facility] for tasks assessed/performed      Past Medical History:  Diagnosis Date  . Acute lower GI bleeding after colonoscopy and polypectomy, resolved 12/12/2015  . Cataract    bilateral surgery to remove  . Colon polyp   . Constipation   . Hemorrhoids   . Hepatitis    self resolved, likely food exposure, 1974.   Marland Kitchen Hx of adenomatous colonic polyps 12/05/2015  . Hyperlipidemia   . Hypertension   . Hypothyroidism   . Skin cancer    basal cell, R ear, MOHS    Past Surgical History:  Procedure Laterality Date  . BROW LIFT Bilateral 10/23/2016   Procedure: BLEPHAROPLASTY upper eyelid with excess skin;  Surgeon: Karle Starch, MD;  Location: [place];  Service: Ophthalmology;  Laterality: Bilateral;  MAC  . CATARACT EXTRACTION  1986   OD  . CATARACT EXTRACTION Bilateral 1995   x 2 for right and left   . COLONOSCOPY    . ECTROPION REPAIR Bilateral 10/23/2016   Procedure: REPAIR OF ECTROPION sutures, extensive;  Surgeon: Karle Starch, MD;  Location: [facility];  Service: Ophthalmology;  Laterality: Bilateral;  . LIPOMA EXCISION  06/1997   left neck (Juengel)  . MOHS SURGERY Right 2013   behind right ear  . TONSILLECTOMY    . VASECTOMY  1982    There were no vitals filed for this visit.      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 Was hobbling around that evening but cleared up the next day. Taking ibuprofen. No L knee pain when walking from waiting room to treatment room. Has an ache in the back of his R hip at times (no R hip ache today).  Took the same amount of ibuprofen as previous session.    Pertinent History Bilateral knee pain. Pt states having pain in his L knee currently. Has pain in his R knee at times. Has always had knee problems due to tennis. Pain worsened during the spring of 2018. Has always taken ibuprofen  which helps but takes a higher dose now due to increased pain. Denies locking sensation bilateral knees.    Patient Stated Goals I'd love to get back to playing tennis, improve mobility (being able to go up and down from the floor or chair easier), be able to load and unload his kayak, get into and out of his kayak.    Currently in Pain? No/denies   Pain Score 0-No pain   Pain Onset More than a month ago  PT Education - 02/11/17 1807    Education provided Yes   Education Details ther-ex, HEP   Person(s) Educated Patient   Methods Explanation;Demonstration;Tactile cues;Verbal cues;Handout   Comprehension Returned demonstration;Verbalized understanding        Objectives    Manual therapy   Supine medial glide to L patella grade 3-           Supine STM L lateral hamstrings    There-ex   Seated L knee flexion targeting the medial hamstrings  Supine SLR L hip flexion to promote VMO muscle use 10x2   S/L L hip abduction 10x  Prone glute max/quad set10x2 with 5 second holds  Seated L knee flexion targeting the medial hamstrings 10x3 resisting green band    SLS 10x2 with 5 second holds with bilateral UE assist to promote glute med strengthening.   Standing ankle DF/PF on rocker board to  promtoe gastroc flexibility 2 minutes  Standing hip machine:   Hip extension plate 70 for 23N each LE  Forward step up onto Dyna disc with L LE and R UE assist, emphasis on femoral control 10x2  Walking on balance stones 5x each way with one UE assist     Improved exercise technique, movement at target joints, use of target muscles after mod verbal, visual, tactile cues.    Pt tolerated session well without complain of knee pain. Continued working on decreasing lateral hamstring tension, VMO, glute med and max strengthening, and femoral control to help decrease knee pain, especially when on uneven surfaces.        PT Long Term Goals - 02/05/17 1812      PT LONG TERM GOAL #1   Title Patient will have a decrease in L knee pain to 3/10 or less at most to promote ability to walk on uneven ground, perform transfers, participate in activities such as tennis and kayaking.    Baseline 6/10 L knee pain at most for the past month (takes ibuprofen), 02/05/2017   Time 6   Period Weeks   Status New     PT LONG TERM GOAL #2   Title Patient will improve bilateral LE strength by at least 1/2 MMT grade to promote ability to perform functional tasks with less bilateral knee pain.    Time 6   Period Weeks   Status New     PT LONG TERM GOAL #3   Title Patient will improve his LEFS score by at least 9 points as a demonstration of improved function.    Baseline 44/80 (02/05/2017)   Time 6   Period Weeks   Status New     PT LONG TERM GOAL #4   Title Pt will report being able to play at least 1 game of tennis with 3/10 L knee pain or less to promote ability to participate in recreational activities.    Baseline Unable to play tennis due to knee pain; 6/10 at most (02/05/2017)   Time 6   Period Weeks   Status New               Plan - 02/11/17 1738    Clinical Impression Statement Pt tolerated session well without complain of knee pain. Continued working on decreasing lateral  hamstring tension, VMO, glute med and max strengthening, and femoral control to help decrease knee pain, especially when on uneven surfaces.    History and Personal Factors relevant to plan of care: Chronicity of condition   Clinical Presentation Stable   Clinical  Presentation due to: no knee pain today, pt took same amount of ibuprofen as previous session   Clinical Decision Making Low   Rehab Potential Good   Clinical Impairments Affecting Rehab Potential (+) chronicity of condition; (+) motivated, active   PT Frequency 2x / week   PT Duration 6 weeks   PT Treatment/Interventions Therapeutic exercise;Therapeutic activities;Manual techniques;Aquatic Therapy;Electrical Stimulation;Iontophoresis 25m/ml Dexamethasone;Ultrasound;Neuromuscular re-education;Patient/family education;Dry needling   PT Next Visit Plan hip strengthening, femoral control, manual therapy and modalities PRN   Consulted and Agree with Plan of Care Patient      Patient will benefit from skilled therapeutic intervention in order to improve the following deficits and impairments:  Pain, Improper body mechanics, Difficulty walking, Decreased strength  Visit Diagnosis: Chronic pain of left knee  Chronic pain of right knee  Muscle weakness (generalized)  Difficulty in walking, not elsewhere classified     Problem List Patient Active Problem List   Diagnosis Date Noted  . Poison ivy 11/21/2016  . Gross hematuria 05/01/2016  . Weak urinary stream 05/01/2016  . Prostate cancer screening 05/01/2016  . Hematuria 03/06/2016  . Hx of adenomatous colonic polyps 12/05/2015  . Advance care planning 07/21/2014  . Medicare annual wellness visit, subsequent 07/01/2012  . External hemorrhoids 06/27/2011  . Hypothyroidism 02/24/2008  . HYPERCHOLESTEROLEMIA 02/21/2007  . HYPERTENSION, BENIGN ESSENTIAL 02/21/2007  . DEGENERATIVE JOINT DISEASE, CERVICAL SPINE 02/21/2007    MJoneen BoersPT, DPT   02/11/2017, 6:50 PM  [facility] [address] [place] [address]Phone: [phone]  Fax:  [phone] Name: TEDER MACEKMRN: [id number] of Birth: 103/24/1947

## 2017-02-11 NOTE — Patient Instructions (Signed)
   Quadriceps Set (Prone)   With toes supporting lower legs, squeeze rear end muscles and tighten thigh muscles to straighten knees. Hold _5___ seconds. Relax. Repeat __10__ times per set. Do ___3_ sets per session. Do ___1_ sessions per day.  http://orth.exer.us/726   Copyright  VHI. All rights reserved.   

## 2017-02-13 ENCOUNTER — Ambulatory Visit: Payer: Medicare Other

## 2017-02-13 DIAGNOSIS — M6281 Muscle weakness (generalized): Secondary | ICD-10-CM

## 2017-02-13 DIAGNOSIS — M25561 Pain in right knee: Secondary | ICD-10-CM

## 2017-02-13 DIAGNOSIS — M25562 Pain in left knee: Principal | ICD-10-CM

## 2017-02-13 DIAGNOSIS — G8929 Other chronic pain: Secondary | ICD-10-CM

## 2017-02-13 DIAGNOSIS — R262 Difficulty in walking, not elsewhere classified: Secondary | ICD-10-CM

## 2017-02-13 NOTE — Therapy (Signed)
[facility] [address], [place], [address] Phone: [phone]   Fax:  [phone]  Physical Therapy Treatment  Patient Details  Name: James [name] MRN: [id number] Date of Birth: Nov 09, 1945 Referring Provider: Pleas Koch, NP  Encounter Date: 02/13/2017      PT End of Session - 02/13/17 1652    Visit Number 4   Number of Visits 13   Date for PT Re-Evaluation 03/21/17   Authorization Type 4   Authorization Time Period of 10 g-code   PT Start Time 1652   PT Stop Time 1733   PT Time Calculation (min) 41 min   Activity Tolerance Patient tolerated treatment well   Behavior During Therapy [facility] for tasks assessed/performed      Past Medical History:  Diagnosis Date  . Acute lower GI bleeding after colonoscopy and polypectomy, resolved 12/12/2015  . Cataract    bilateral surgery to remove  . Colon polyp   . Constipation   . Hemorrhoids   . Hepatitis    self resolved, likely food exposure, 1974.   Marland Kitchen Hx of adenomatous colonic polyps 12/05/2015  . Hyperlipidemia   . Hypertension   . Hypothyroidism   . Skin cancer    basal cell, R ear, MOHS    Past Surgical History:  Procedure Laterality Date  . BROW LIFT Bilateral 10/23/2016   Procedure: BLEPHAROPLASTY upper eyelid with excess skin;  Surgeon: Karle Starch, MD;  Location: [facility];  Service: Ophthalmology;  Laterality: Bilateral;  MAC  . CATARACT EXTRACTION  1986   OD  . CATARACT EXTRACTION Bilateral 1995   x 2 for right and left   . COLONOSCOPY    . ECTROPION REPAIR Bilateral 10/23/2016   Procedure: REPAIR OF ECTROPION sutures, extensive;  Surgeon: Karle Starch, MD;  Location: [facility];  Service: Ophthalmology;  Laterality: Bilateral;  . LIPOMA EXCISION  06/1997   left neck (Juengel)  . MOHS SURGERY Right 2013   behind right ear  . TONSILLECTOMY    . VASECTOMY  1982    There were no vitals filed for this visit.       Subjective Assessment - 02/13/17 1653    Subjective L knee is doing pretty good. Not bothering him at all. Took the same amount of ibuprofen as before. 1/10 L knee pain at most since last session.     Pertinent History Bilateral knee pain. Pt states having pain in his L knee currently. Has pain in his R knee at times. Has always had knee problems due to tennis. Pain worsened during the spring of 2018. Has always taken ibuprofen  which helps but takes a higher dose now due to increased pain. Denies locking sensation bilateral knees.    Patient Stated Goals I'd love to get back to playing tennis, improve mobility (being able to go up and down from the floor or chair easier), be able to load and unload his kayak, get into and out of his kayak.    Currently in Pain? No/denies   Pain Score 0-No pain   Pain Onset More than a month ago                                 PT Education - 02/13/17 1658    Education provided Yes   Education Details ther-ex   [employer]) Educated Patient   Methods Explanation;Demonstration;Tactile  cues;Verbal cues   Comprehension Returned demonstration;Verbalized understanding        Objectives    Manual therapy  Supine STM L lateral hamstrings  Supine STM to vastus lateralis on L       There-ex  Seated hip adduction ball squeeze 10x2 with 5 second holds with glute max squeeze with gentle isometric knee extension to help target VMO as well  Supine SLR L hip flexion to promote VMO muscle use 10x2   Prone glute max/quad set10x2 with 5 second holds  Standing hip machine:              Hip extension plate 70 for 26Z each LE  Forward step up onto Dyna disc with L LE and R UE assist, emphasis on femoral control 10x2   SLS 10x2 with 5 second holds with bilateral UE assist to promote glute med strengthening.   Standing ankle DF/PF on rocker board to promtoe gastroc flexibility 2 minutes    Improved  exercise technique, movement at target joints, use of target muscles after min to mod verbal, visual, tactile cues.    Tight L vastus lateralis and lateral hamstrings which improved after manual therapy. Good carry over of decreased to no L knee pain from one session to the next. Pt however reports also taking ibuprofen. Continued working on glute strengthening and femoral control to help decrease L knee pain during standing activities.                     PT Long Term Goals - 02/05/17 1812      PT LONG TERM GOAL #1   Title Patient will have a decrease in L knee pain to 3/10 or less at most to promote ability to walk on uneven ground, perform transfers, participate in activities such as tennis and kayaking.    Baseline 6/10 L knee pain at most for the past month (takes ibuprofen), 02/05/2017   Time 6   Period Weeks   Status New     PT LONG TERM GOAL #2   Title Patient will improve bilateral LE strength by at least 1/2 MMT grade to promote ability to perform functional tasks with less bilateral knee pain.    Time 6   Period Weeks   Status New     PT LONG TERM GOAL #3   Title Patient will improve his LEFS score by at least 9 points as a demonstration of improved function.    Baseline 44/80 (02/05/2017)   Time 6   Period Weeks   Status New     PT LONG TERM GOAL #4   Title Pt will report being able to play at least 1 game of tennis with 3/10 L knee pain or less to promote ability to participate in recreational activities.    Baseline Unable to play tennis due to knee pain; 6/10 at most (02/05/2017)   Time 6   Period Weeks   Status New               Plan - 02/13/17 1658    Clinical Impression Statement Tight L vastus lateralis and lateral hamstrings which improved after manual therapy. Good carry over of decreased to no L knee pain from one session to the next. Pt however reports also taking ibuprofen. Continued working on glute strengthening and femoral control to  help decrease L knee pain during standing activities.    History and Personal Factors relevant to plan of care: Chronicity of condition  Clinical Presentation Stable   Clinical Presentation due to: no knee pain today, pt took same amount of ibuprofen as previous session   Clinical Decision Making Low   Rehab Potential Good   Clinical Impairments Affecting Rehab Potential (+) chronicity of condition; (+) motivated, active   PT Frequency 2x / week   PT Duration 6 weeks   PT Treatment/Interventions Therapeutic exercise;Therapeutic activities;Manual techniques;Aquatic Therapy;Electrical Stimulation;Iontophoresis 68m/ml Dexamethasone;Ultrasound;Neuromuscular re-education;Patient/family education;Dry needling   PT Next Visit Plan hip strengthening, femoral control, manual therapy and modalities PRN   Consulted and Agree with Plan of Care Patient      Patient will benefit from skilled therapeutic intervention in order to improve the following deficits and impairments:  Pain, Improper body mechanics, Difficulty walking, Decreased strength  Visit Diagnosis: Chronic pain of left knee  Chronic pain of right knee  Muscle weakness (generalized)  Difficulty in walking, not elsewhere classified     Problem List Patient Active Problem List   Diagnosis Date Noted  . Poison ivy 11/21/2016  . Gross hematuria 05/01/2016  . Weak urinary stream 05/01/2016  . Prostate cancer screening 05/01/2016  . Hematuria 03/06/2016  . Hx of adenomatous colonic polyps 12/05/2015  . Advance care planning 07/21/2014  . Medicare annual wellness visit, subsequent 07/01/2012  . External hemorrhoids 06/27/2011  . Hypothyroidism 02/24/2008  . HYPERCHOLESTEROLEMIA 02/21/2007  . HYPERTENSION, BENIGN ESSENTIAL 02/21/2007  . DEGENERATIVE JOINT DISEASE, CERVICAL SPINE 02/21/2007     MJoneen BoersPT, DPT  02/13/2017, 9:17 PM  [facility] [address] [place] [address]Phone: [phone]  Fax:  [phone] Name: James BADALMRN: [id number] of Birth: 109-07-1946

## 2017-02-19 ENCOUNTER — Ambulatory Visit: Payer: Medicare Other

## 2017-02-19 DIAGNOSIS — R262 Difficulty in walking, not elsewhere classified: Secondary | ICD-10-CM

## 2017-02-19 DIAGNOSIS — G8929 Other chronic pain: Secondary | ICD-10-CM

## 2017-02-19 DIAGNOSIS — M6281 Muscle weakness (generalized): Secondary | ICD-10-CM

## 2017-02-19 DIAGNOSIS — M25562 Pain in left knee: Secondary | ICD-10-CM | POA: Diagnosis not present

## 2017-02-19 NOTE — Patient Instructions (Signed)
Gave seated L knee flexion resisting green band targeting the medial hamstrings 10x3 daily as part of his HEP. Pt demonstrated and verbalized understanding.

## 2017-02-19 NOTE — Therapy (Signed)
[facility] [address] [place], [address] Phone: [phone]   Fax:  [phone]  Physical Therapy Treatment  Patient Details  Name: James [name] MRN: [id number] Date of Birth: 03-01-1946 Referring Provider: Pleas Koch, NP  Encounter Date: 02/19/2017      PT End of Session - 02/19/17 1620    Visit Number 5   Number of Visits 13   Date for PT Re-Evaluation 03/21/17   Authorization Type 5   Authorization Time Period of 10 g-code   PT Start Time 1621   PT Stop Time 1703   PT Time Calculation (min) 42 min   Activity Tolerance Patient tolerated treatment well   Behavior During Therapy [facility] for tasks assessed/performed      Past Medical History:  Diagnosis Date  . Acute lower GI bleeding after colonoscopy and polypectomy, resolved 12/12/2015  . Cataract    bilateral surgery to remove  . Colon polyp   . Constipation   . Hemorrhoids   . Hepatitis    self resolved, likely food exposure, 1974.   Marland Kitchen Hx of adenomatous colonic polyps 12/05/2015  . Hyperlipidemia   . Hypertension   . Hypothyroidism   . Skin cancer    basal cell, R ear, MOHS    Past Surgical History:  Procedure Laterality Date  . BROW LIFT Bilateral 10/23/2016   Procedure: BLEPHAROPLASTY upper eyelid with excess skin;  Surgeon: Karle Starch, MD;  Location: [facility];  Service: Ophthalmology;  Laterality: Bilateral;  MAC  . CATARACT EXTRACTION  1986   OD  . CATARACT EXTRACTION Bilateral 1995   x 2 for right and left   . COLONOSCOPY    . ECTROPION REPAIR Bilateral 10/23/2016   Procedure: REPAIR OF ECTROPION sutures, extensive;  Surgeon: Karle Starch, MD;  Location: [facility];  Service: Ophthalmology;  Laterality: Bilateral;  . LIPOMA EXCISION  06/1997   left neck (Juengel)  . MOHS SURGERY Right 2013   behind right ear  . TONSILLECTOMY    . VASECTOMY  1982    There were no vitals filed for this visit.       Subjective Assessment - 02/19/17 1621    Subjective L knee feels perfect. No pain today or yesterday. 1/10 L knee pain at most for the past 6 days. Still takes the same amount of ibuprofen over the counter, MD told him to take it.   Planning on trying to not take his Ibuprofen tomorrow.     Pertinent History Bilateral knee pain. Pt states having pain in his L knee currently. Has pain in his R knee at times. Has always had knee problems due to tennis. Pain worsened during the spring of 2018. Has always taken ibuprofen  which helps but takes a higher dose now due to increased pain. Denies locking sensation bilateral knees.    Patient Stated Goals I'd love to get back to playing tennis, improve mobility (being able to go up and down from the floor or chair easier), be able to load and unload his kayak, get into and out of his kayak.    Currently in Pain? No/denies   Pain Score 0-No pain   Pain Onset More than a month ago                                 PT Education - 02/19/17 1631  Education provided Yes   Education Details ther-ex, HEP   Person(s) Educated Patient   Methods Explanation;Demonstration;Tactile cues;Verbal cues   Comprehension Verbalized understanding;Returned demonstration      Objectives   There-ex   Seated L knee flexion targeting medial hamstrings resisting green band 10x3 Seated hip adductor physioball squeeze with glute max squeeze and gentle knee extension isometrics to help activate VMO 10x2 with 5 second holds  T-band Cowboy walk resisting green band 32 ft x 2. L medial knee pain at tibia which eases with rest.  Then 32 ft x 2 after low dye tape. No pain.    Standing posture: bilateral foot pronation  Low Dye Tape L foot to support arch. Decreased L knee pain with standing exercises. Pt was recommended to try arch supports. Pt verbalized understanding.   T-band side step green 32 ft each direction. Slight medial knee pain which  disappeared..   L LE on Air Ex pad: R hip abduction with light touch assist 10x2 to work L glute med in closed chain   Improved exercise technique, movement at target joints, use of target muscles after min to mod verbal, visual, tactile cues.    Manual therapy  STM lateral L hamstrings in supine   Decreased knee pain with standing exercise following low dye tape to support arch.  Continued working on decreasing lateral hamstring pull on L tibia in addition to hip muscle strengthening to promote ability to perform standing tasks with less pain.           PT Long Term Goals - 02/05/17 1812      PT LONG TERM GOAL #1   Title Patient will have a decrease in L knee pain to 3/10 or less at most to promote ability to walk on uneven ground, perform transfers, participate in activities such as tennis and kayaking.    Baseline 6/10 L knee pain at most for the past month (takes ibuprofen), 02/05/2017   Time 6   Period Weeks   Status New     PT LONG TERM GOAL #2   Title Patient will improve bilateral LE strength by at least 1/2 MMT grade to promote ability to perform functional tasks with less bilateral knee pain.    Time 6   Period Weeks   Status New     PT LONG TERM GOAL #3   Title Patient will improve his LEFS score by at least 9 points as a demonstration of improved function.    Baseline 44/80 (02/05/2017)   Time 6   Period Weeks   Status New     PT LONG TERM GOAL #4   Title Pt will report being able to play at least 1 game of tennis with 3/10 L knee pain or less to promote ability to participate in recreational activities.    Baseline Unable to play tennis due to knee pain; 6/10 at most (02/05/2017)   Time 6   Period Weeks   Status New               Plan - 02/19/17 1835    Clinical Impression Statement Decreased knee pain with standing exercise following low dye tape to support arch.  Continued working on decreasing lateral hamstring pull on L tibia in addition to hip  muscle strengthening to promote ability to perform standing tasks with less pain.   History and Personal Factors relevant to plan of care: Chronicity of condition   Clinical Presentation Stable   Clinical Presentation due to:  Minimal to no knee pain, pt takes same amount of ibuprofen.    Clinical Decision Making Low   Rehab Potential Good   Clinical Impairments Affecting Rehab Potential (+) chronicity of condition; (+) motivated, active   PT Frequency 2x / week   PT Duration 6 weeks   PT Treatment/Interventions Therapeutic exercise;Therapeutic activities;Manual techniques;Aquatic Therapy;Electrical Stimulation;Iontophoresis 41m/ml Dexamethasone;Ultrasound;Neuromuscular re-education;Patient/family education;Dry needling   PT Next Visit Plan hip strengthening, femoral control, manual therapy and modalities PRN   Consulted and Agree with Plan of Care Patient      Patient will benefit from skilled therapeutic intervention in order to improve the following deficits and impairments:  Pain, Improper body mechanics, Difficulty walking, Decreased strength  Visit Diagnosis: Chronic pain of left knee  Muscle weakness (generalized)  Difficulty in walking, not elsewhere classified     Problem List Patient Active Problem List   Diagnosis Date Noted  . Poison ivy 11/21/2016  . Gross hematuria 05/01/2016  . Weak urinary stream 05/01/2016  . Prostate cancer screening 05/01/2016  . Hematuria 03/06/2016  . Hx of adenomatous colonic polyps 12/05/2015  . Advance care planning 07/21/2014  . Medicare annual wellness visit, subsequent 07/01/2012  . External hemorrhoids 06/27/2011  . Hypothyroidism 02/24/2008  . HYPERCHOLESTEROLEMIA 02/21/2007  . HYPERTENSION, BENIGN ESSENTIAL 02/21/2007  . DEGENERATIVE JOINT DISEASE, CERVICAL SPINE 02/21/2007   MJoneen BoersPT, DPT   02/19/2017, 6:42 PM  [facility] [address]  [place] [address]Phone: [phone]  Fax:  [phone] Name: TMADDYX WIECKMRN: [id number] of Birth: [birth year]

## 2017-02-21 ENCOUNTER — Ambulatory Visit: Payer: Medicare Other

## 2017-02-21 DIAGNOSIS — G8929 Other chronic pain: Secondary | ICD-10-CM | POA: Diagnosis not present

## 2017-02-21 DIAGNOSIS — R262 Difficulty in walking, not elsewhere classified: Secondary | ICD-10-CM | POA: Diagnosis not present

## 2017-02-21 DIAGNOSIS — M6281 Muscle weakness (generalized): Secondary | ICD-10-CM

## 2017-02-21 DIAGNOSIS — M25561 Pain in right knee: Secondary | ICD-10-CM | POA: Diagnosis not present

## 2017-02-21 DIAGNOSIS — M25562 Pain in left knee: Principal | ICD-10-CM

## 2017-02-21 NOTE — Therapy (Signed)
[facility] [address], [place], [address] Phone: [phone]   Fax:  [phone]  Physical Therapy Treatment  Patient Details  Name: James [name] MRN: [id number] Date of Birth: Jun 10, 1946 Referring Provider: Pleas Koch, NP  Encounter Date: 02/21/2017      PT End of Session - 02/21/17 1050    Visit Number 6   Number of Visits 13   Date for PT Re-Evaluation 03/21/17   Authorization Type 6   Authorization Time Period of 10 g-code   PT Start Time 0959   PT Stop Time 1052   PT Time Calculation (min) 53 min   Activity Tolerance Patient tolerated treatment well   Behavior During Therapy [facility] for tasks assessed/performed      Past Medical History:  Diagnosis Date  . Acute lower GI bleeding after colonoscopy and polypectomy, resolved 12/12/2015  . Cataract    bilateral surgery to remove  . Colon polyp   . Constipation   . Hemorrhoids   . Hepatitis    self resolved, likely food exposure, 1974.   Marland Kitchen Hx of adenomatous colonic polyps 12/05/2015  . Hyperlipidemia   . Hypertension   . Hypothyroidism   . Skin cancer    basal cell, R ear, MOHS    Past Surgical History:  Procedure Laterality Date  . BROW LIFT Bilateral 10/23/2016   Procedure: BLEPHAROPLASTY upper eyelid with excess skin;  Surgeon: Karle Starch, MD;  Location: [place];  Service: Ophthalmology;  Laterality: Bilateral;  MAC  . CATARACT EXTRACTION  1986   OD  . CATARACT EXTRACTION Bilateral 1995   x 2 for right and left   . COLONOSCOPY    . ECTROPION REPAIR Bilateral 10/23/2016   Procedure: REPAIR OF ECTROPION sutures, extensive;  Surgeon: Karle Starch, MD;  Location: [place];  Service: Ophthalmology;  Laterality: Bilateral;  . LIPOMA EXCISION  06/1997   left neck (Juengel)  . MOHS SURGERY Right 2013   behind right ear  . TONSILLECTOMY    . VASECTOMY  1982    There were no vitals filed for this visit.       Subjective Assessment - 02/21/17 0958    Subjective Stopped taking the ibuprofen since Tuesday.  R knee is not doing as well. Woke up with discomfort at 3-4 am this morning. Pt was sleeping on his L side with his R knee on top of his L knee. Could not find a position which did not bother him. Tried using a pillow between his knees which helped a little bit. Lying down on his back with a pillow behind his knee gives him the most relief.   Feels more swollen and puffy.  2/10 L knee pain currently,    Pertinent History Bilateral knee pain. Pt states having pain in his L knee currently. Has pain in his R knee at times. Has always had knee problems due to tennis. Pain worsened during the spring of 2018. Has always taken ibuprofen  which helps but takes a higher dose now due to increased pain. Denies locking sensation bilateral knees.    Patient Stated Goals I'd love to get back to playing tennis, improve mobility (being able to go up and down from the floor or chair easier), be able to load and unload his kayak, get into and out of his kayak.    Currently in Pain? Yes   Pain Score 2    Pain Onset  More than a month ago                                 PT Education - 02/21/17 1102    Education provided Yes   Education Details ther-ex, HEP   Person(s) Educated Patient   Methods Demonstration;Explanation;Verbal cues;Tactile cues   Comprehension Verbalized understanding;Returned demonstration        Objectives  Manual therapy  Supine medial tibial rotation mobilization grade 1 to 3- to L LE  Decreased L knee pain with gait  Muscle energy technique to promote knee extension  Improved L knee extension AROM to -15 degrees  STM lateral L hamstrings in supine to decrease lateral hamstring tension   There-ex  Seated L knee extension AROM -18 degrees prior to muscle energy technique  Gait throughout session to assess effectiveness of treatment  Supine quad set  10x3 with 5 second holds. L medial knee pain (at proximal tibia) which disappeared when supported into gentle IR at tibia by PT  rocker board ankle DF/PF with bilateral UE assist to promote L knee extension and ankle DF.  Seated L knee extension resisting 5 lb ankle weight 10x5 seconds, then 10x10 seconds to promote quadriceps muscle use and reciprocally inhibit hamstrings to promote knee extension   Improved exercise technique, movement at target joints, use of target muscles after min to mod verbal, visual, tactile cues.    Decreased L knee pain following manual therapy and exercise to promote medial rotation of L tibia, decrease L lateral hamstrings tension, and improve L knee extension AROM. No L knee pain after session. Just an insignificant discomfort medial L knee at proximal tibia per pt. Pt will benefit from continued skilled physical therapy services to decrease L knee pain with standing activities, improve knee extension ROM and proper positioning of his L tibia to promote ability to perform functional tasks.           PT Long Term Goals - 02/05/17 1812      PT LONG TERM GOAL #1   Title Patient will have a decrease in L knee pain to 3/10 or less at most to promote ability to walk on uneven ground, perform transfers, participate in activities such as tennis and kayaking.    Baseline 6/10 L knee pain at most for the past month (takes ibuprofen), 02/05/2017   Time 6   Period Weeks   Status New     PT LONG TERM GOAL #2   Title Patient will improve bilateral LE strength by at least 1/2 MMT grade to promote ability to perform functional tasks with less bilateral knee pain.    Time 6   Period Weeks   Status New     PT LONG TERM GOAL #3   Title Patient will improve his LEFS score by at least 9 points as a demonstration of improved function.    Baseline 44/80 (02/05/2017)   Time 6   Period Weeks   Status New     PT LONG TERM GOAL #4   Title Pt will report being able to play at  least 1 game of tennis with 3/10 L knee pain or less to promote ability to participate in recreational activities.    Baseline Unable to play tennis due to knee pain; 6/10 at most (02/05/2017)   Time 6   Period Weeks   Status New  Plan - 02/21/17 0952    Clinical Impression Statement Decreased L knee pain following manual therapy and exercise to promote medial rotation of L tibia, decrease L lateral hamstrings tension, and improve L knee extension AROM. No L knee pain after session. Just an insignificant discomfort medial L knee at proximal tibia per pt. Pt will benefit from continued skilled physical therapy services to decrease L knee pain with standing activities, improve knee extension ROM and proper positioning of his L tibia to promote ability to perform functional tasks.    History and Personal Factors relevant to plan of care: Chronicity of condition   Clinical Presentation Stable   Clinical Presentation due to: Minimal to no L knee pain after session. No ibuprofen used per pt.    Clinical Decision Making Low   Rehab Potential Good   Clinical Impairments Affecting Rehab Potential (+) chronicity of condition; (+) motivated, active   PT Frequency 2x / week   PT Duration 6 weeks   PT Treatment/Interventions Therapeutic exercise;Therapeutic activities;Manual techniques;Aquatic Therapy;Electrical Stimulation;Iontophoresis 11m/ml Dexamethasone;Ultrasound;Neuromuscular re-education;Patient/family education;Dry needling   PT Next Visit Plan hip strengthening, femoral control, manual therapy and modalities PRN   Consulted and Agree with Plan of Care Patient      Patient will benefit from skilled therapeutic intervention in order to improve the following deficits and impairments:  Pain, Improper body mechanics, Difficulty walking, Decreased strength  Visit Diagnosis: Chronic pain of left knee  Muscle weakness (generalized)  Difficulty in walking, not elsewhere  classified     Problem List Patient Active Problem List   Diagnosis Date Noted  . Poison ivy 11/21/2016  . Gross hematuria 05/01/2016  . Weak urinary stream 05/01/2016  . Prostate cancer screening 05/01/2016  . Hematuria 03/06/2016  . Hx of adenomatous colonic polyps 12/05/2015  . Advance care planning 07/21/2014  . Medicare annual wellness visit, subsequent 07/01/2012  . External hemorrhoids 06/27/2011  . Hypothyroidism 02/24/2008  . HYPERCHOLESTEROLEMIA 02/21/2007  . HYPERTENSION, BENIGN ESSENTIAL 02/21/2007  . DEGENERATIVE JOINT DISEASE, CERVICAL SPINE 02/21/2007    MJoneen BoersPT, DPT  02/21/2017, 11:10 AM  [facility] [address] [place] [address]Phone: [phone]  Fax:  [phone] Name: James WINCHELLMRN: [id number] of Birth: 127-Dec-1947

## 2017-02-21 NOTE — Patient Instructions (Signed)
Pt was recommended to try using a heating pad to his L lateral hamstrings with his leg propped x 15 minutes (with a cloth barrier between the skin and heating pad) then perform his seated knee flexion resisting the green band targeting the medial hamstrings exercise.   Pt was also recommended to perform seated knee extension exercises holding 10 seconds to 15 seconds at a time if not using an ankle weight at home to promote knee extension ROM and quadriceps muscle use.   Pt demonstrated and verbalized understanding.

## 2017-02-26 ENCOUNTER — Ambulatory Visit: Payer: Medicare Other

## 2017-02-26 DIAGNOSIS — G8929 Other chronic pain: Secondary | ICD-10-CM

## 2017-02-26 DIAGNOSIS — M25562 Pain in left knee: Principal | ICD-10-CM

## 2017-02-26 DIAGNOSIS — M6281 Muscle weakness (generalized): Secondary | ICD-10-CM

## 2017-02-26 DIAGNOSIS — R262 Difficulty in walking, not elsewhere classified: Secondary | ICD-10-CM

## 2017-02-26 NOTE — Therapy (Signed)
[facility] [address], [place], [address] Phone: [phone]   Fax:  [phone]  Physical Therapy Treatment  Patient Details  Name: James [name] MRN: [id number] Date of Birth: 1945/09/12 Referring Provider: Pleas Koch, NP  Encounter Date: 02/26/2017      PT End of Session - 02/26/17 1745    Visit Number 7   Number of Visits 13   Date for PT Re-Evaluation 03/21/17   Authorization Type 7   Authorization Time Period of 10 g-code   PT Start Time 8329   PT Stop Time 1840   PT Time Calculation (min) 54 min   Activity Tolerance Patient tolerated treatment well   Behavior During Therapy [facility] for tasks assessed/performed      Past Medical History:  Diagnosis Date  . Acute lower GI bleeding after colonoscopy and polypectomy, resolved 12/12/2015  . Cataract    bilateral surgery to remove  . Colon polyp   . Constipation   . Hemorrhoids   . Hepatitis    self resolved, likely food exposure, 1974.   Marland Kitchen Hx of adenomatous colonic polyps 12/05/2015  . Hyperlipidemia   . Hypertension   . Hypothyroidism   . Skin cancer    basal cell, R ear, MOHS    Past Surgical History:  Procedure Laterality Date  . BROW LIFT Bilateral 10/23/2016   Procedure: BLEPHAROPLASTY upper eyelid with excess skin;  Surgeon: Karle Starch, MD;  Location: [place];  Service: Ophthalmology;  Laterality: Bilateral;  MAC  . CATARACT EXTRACTION  1986   OD  . CATARACT EXTRACTION Bilateral 1995   x 2 for right and left   . COLONOSCOPY    . ECTROPION REPAIR Bilateral 10/23/2016   Procedure: REPAIR OF ECTROPION sutures, extensive;  Surgeon: Karle Starch, MD;  Location: [place];  Service: Ophthalmology;  Laterality: Bilateral;  . LIPOMA EXCISION  06/1997   left neck (Juengel)  . MOHS SURGERY Right 2013   behind right ear  . TONSILLECTOMY    . VASECTOMY  1982    There were no vitals filed for this visit.       Subjective Assessment - 02/26/17 1747    Subjective L knee is 1/10. Did not take ibuprofen. Took ibuprofen since after last session until yesterday. L knee was okay after last session. Not pain but stiffness or a real dull ache.  Feels like the home exercises keep his L knee from being pain free.  Does not know which specific exercise does it.    Pertinent History Bilateral knee pain. Pt states having pain in his L knee currently. Has pain in his R knee at times. Has always had knee problems due to tennis. Pain worsened during the spring of 2018. Has always taken ibuprofen  which helps but takes a higher dose now due to increased pain. Denies locking sensation bilateral knees.    Patient Stated Goals I'd love to get back to playing tennis, improve mobility (being able to go up and down from the floor or chair easier), be able to load and unload his kayak, get into and out of his kayak.    Currently in Pain? Yes   Pain Score 1    Pain Onset More than a month ago  PT Education - 02/26/17 1851    Education provided Yes   Education Details ther-ex   Person(s) Educated Patient   Methods Explanation;Demonstration;Tactile cues;Verbal cues   Comprehension Returned demonstration;Verbalized understanding        Objectives  -15 degrees seated L knee extension AROM at start of session    Manual therapy  Muscle energy technique to promote knee extension             Improved L knee extension AROM to -13 degrees  STM lateral L hamstrings in supine to decrease lateral hamstring tension  No change in L knee pain today   Supine medial tibial rotation mobilization grade 1 to 3- to L LE             Decreased L knee pain with gait     There-ex  Pt was recommended to perform one home exercise at a time per day to see which one bothers his L knee. Pt verbalized understanding.    Gait throughout session to assess effectiveness  of treatment  Supine quad set 10x 5 second holds  Seated L knee extension resisting 5 lb ankle weight 10x10 seconds to promote quadriceps muscle use and reciprocally inhibit hamstrings to promote knee extension  Standing hip machine:              Hip extension plate 70 for 63K1 each LE   One LE on Air Ex pad:  hip abduction with bilateral UE  assist 10x2 to work glute med in closed chain and open chain. Bilateral UE assist  Standing heel -toe raises 10x2 each way with bilateral UE assist    Forward step up onto air ex pad with L LE 10x2 Lateral step up onto Air ex pad with L LE 10x2  NuStep at end of session seat 11, level 3 for 5 min (4 min unskilled)   Improved exercise technique, movement at target joints, use of target muscles after min to mod verbal, visual, tactile cues.      Slight decrease in L knee pain following gentle medial rotation of tibia manual therapy. Continued working on knee extension ROM, hip strengthening and femoral control to help decrease L knee pain with closed chain activities. Less starting knee pain at beginning of session compared to last visit.         PT Long Term Goals - 02/05/17 1812      PT LONG TERM GOAL #1   Title Patient will have a decrease in L knee pain to 3/10 or less at most to promote ability to walk on uneven ground, perform transfers, participate in activities such as tennis and kayaking.    Baseline 6/10 L knee pain at most for the past month (takes ibuprofen), 02/05/2017   Time 6   Period Weeks   Status New     PT LONG TERM GOAL #2   Title Patient will improve bilateral LE strength by at least 1/2 MMT grade to promote ability to perform functional tasks with less bilateral knee pain.    Time 6   Period Weeks   Status New     PT LONG TERM GOAL #3   Title Patient will improve his LEFS score by at least 9 points as a demonstration of improved function.    Baseline 44/80 (02/05/2017)   Time 6   Period Weeks   Status New      PT LONG TERM GOAL #4   Title Pt will report being able to play  at least 1 game of tennis with 3/10 L knee pain or less to promote ability to participate in recreational activities.    Baseline Unable to play tennis due to knee pain; 6/10 at most (02/05/2017)   Time 6   Period Weeks   Status New               Plan - 02/26/17 1745    Clinical Impression Statement Slight decrease in L knee pain following gentle medial rotation of tibia manual therapy. Continued working on knee extension ROM, hip strengthening and femoral control to help decrease L knee pain with closed chain activities. Less starting knee pain at beginning of session compared to last visit.    History and Personal Factors relevant to plan of care: Chronicity of condition   Clinical Presentation Stable   Clinical Presentation due to: decreased starting knee pain   Clinical Decision Making Low   Rehab Potential Good   Clinical Impairments Affecting Rehab Potential (+) chronicity of condition; (+) motivated, active   PT Frequency 2x / week   PT Duration 6 weeks   PT Treatment/Interventions Therapeutic exercise;Therapeutic activities;Manual techniques;Aquatic Therapy;Electrical Stimulation;Iontophoresis 58m/ml Dexamethasone;Ultrasound;Neuromuscular re-education;Patient/family education;Dry needling   PT Next Visit Plan hip strengthening, femoral control, manual therapy and modalities PRN   Consulted and Agree with Plan of Care Patient      Patient will benefit from skilled therapeutic intervention in order to improve the following deficits and impairments:  Pain, Improper body mechanics, Difficulty walking, Decreased strength  Visit Diagnosis: Chronic pain of left knee  Muscle weakness (generalized)  Difficulty in walking, not elsewhere classified     Problem List Patient Active Problem List   Diagnosis Date Noted  . Poison ivy 11/21/2016  . Gross hematuria 05/01/2016  . Weak urinary stream 05/01/2016  .  Prostate cancer screening 05/01/2016  . Hematuria 03/06/2016  . Hx of adenomatous colonic polyps 12/05/2015  . Advance care planning 07/21/2014  . Medicare annual wellness visit, subsequent 07/01/2012  . External hemorrhoids 06/27/2011  . Hypothyroidism 02/24/2008  . HYPERCHOLESTEROLEMIA 02/21/2007  . HYPERTENSION, BENIGN ESSENTIAL 02/21/2007  . DEGENERATIVE JOINT DISEASE, CERVICAL SPINE 02/21/2007   MJoneen BoersPT, DPT   02/26/2017, 7:02 PM  [facility] [address] [place] [address]Phone: [phone]  Fax:  [phone] Name: TDEWANE TIMSONMRN: [id number] of Birth: 119-Jan-1947

## 2017-03-05 ENCOUNTER — Ambulatory Visit: Payer: Medicare Other

## 2017-03-05 DIAGNOSIS — M6281 Muscle weakness (generalized): Secondary | ICD-10-CM

## 2017-03-05 DIAGNOSIS — M25561 Pain in right knee: Secondary | ICD-10-CM

## 2017-03-05 DIAGNOSIS — M25562 Pain in left knee: Principal | ICD-10-CM

## 2017-03-05 DIAGNOSIS — R262 Difficulty in walking, not elsewhere classified: Secondary | ICD-10-CM

## 2017-03-05 DIAGNOSIS — G8929 Other chronic pain: Secondary | ICD-10-CM

## 2017-03-05 NOTE — Patient Instructions (Signed)
  Sitting on a chair with a back rest   Pull your left leg back with a strap to feel a comfortable stretch at your hamstrings.    Hold for 15 seconds.    Repeat 5 times per day.

## 2017-03-05 NOTE — Therapy (Signed)
[place] [facility] [address] [place], [address] Phone: [phone]   Fax:  [phone]  Physical Therapy Treatment  Patient Details  Name: James [name] MRN: [id number] Date of Birth: [birth year] Referring Provider: Pleas Koch, NP  Encounter Date: 03/05/2017      PT End of Session - 03/05/17 1636    Visit Number 8   Number of Visits 13   Date for PT Re-Evaluation 03/21/17   Authorization Type 8   Authorization Time Period of 10 g-code   PT Start Time 1636   PT Stop Time 1721   PT Time Calculation (min) 45 min   Activity Tolerance Patient tolerated treatment well   Behavior During Therapy [facility] for tasks assessed/performed      Past Medical History:  Diagnosis Date  . Acute lower GI bleeding after colonoscopy and polypectomy, resolved 12/12/2015  . Cataract    bilateral surgery to remove  . Colon polyp   . Constipation   . Hemorrhoids   . Hepatitis    self resolved, likely food exposure, 1974.   Marland Kitchen Hx of adenomatous colonic polyps 12/05/2015  . Hyperlipidemia   . Hypertension   . Hypothyroidism   . Skin cancer    basal cell, R ear, MOHS    Past Surgical History:  Procedure Laterality Date  . BROW LIFT Bilateral 10/23/2016   Procedure: BLEPHAROPLASTY upper eyelid with excess skin;  Surgeon: Karle Starch, MD;  Location: [place];  Service: Ophthalmology;  Laterality: Bilateral;  MAC  . CATARACT EXTRACTION  1986   OD  . CATARACT EXTRACTION Bilateral 1995   x 2 for right and left   . COLONOSCOPY    . ECTROPION REPAIR Bilateral 10/23/2016   Procedure: REPAIR OF ECTROPION sutures, extensive;  Surgeon: Karle Starch, MD;  Location: [place];  Service: Ophthalmology;  Laterality: Bilateral;  . LIPOMA EXCISION  06/1997   left neck (Juengel)  . MOHS SURGERY Right 2013   behind right ear  . TONSILLECTOMY    . VASECTOMY  1982    There were no vitals filed for this visit.       Subjective Assessment - 03/05/17 1637    Subjective Continued to scale back on the ibuprofen. Did not have any today. L knee feels very slight discomfort. 1/10 currently.  Thinks that the exercise with the ball between his knees might be the home exercise that bothers him.   3-4/10 Friday and Saturday due to walking on uneven ground.    Pertinent History Bilateral knee pain. Pt states having pain in his L knee currently. Has pain in his R knee at times. Has always had knee problems due to tennis. Pain worsened during the spring of 2018. Has always taken ibuprofen  which helps but takes a higher dose now due to increased pain. Denies locking sensation bilateral knees.    Patient Stated Goals I'd love to get back to playing tennis, improve mobility (being able to go up and down from the floor or chair easier), be able to load and unload his kayak, get into and out of his kayak.    Currently in Pain? Yes   Pain Score 1    Pain Onset More than a month ago                                 PT Education -  03/05/17 1641    Education provided Yes   Education Details ther-ex, HEP   Person(s) Educated Patient   Methods Explanation;Demonstration;Tactile cues;Verbal cues;Handout   Comprehension Returned demonstration;Verbalized understanding        Objectives    There-ex  Seated hip adduction ball squeeze with glute max squeeze and knee extension isometrics 10x5 seconds for 2 sets  No increase in pain   Pt was recommended to try 2 folded pillows instead of his small firm ball. Stop if it bothers him.  Pt demonstrated and verbalized understanding.   Seated L knee flexion targeting the medial hamstrings resisting green band 10x  Then blue band 10x2    Standing hip machine:  Hip extension plate 70 for 48N4 each LE  Seated L hamstring stretch 15 seconds x 5  Seated L knee extension plate 15 for 62V0    Improved exercise technique, movement at  target joints, use of target muscles after min to mod verbal, visual, tactile cues.      Manual therapy   Supine STM to lateral hamstrings Supine P to A to R tibia grade 3 with leg propped on pillow and towel to promote knee extension ROM.   No change in 1/10 pain   Pt demonstrates consistent 1/10 medial L knee pain throughout session. Limited L knee extension ROM observed. Worked on increasing knee extension to see if it helps decrease pain with standing activities.        PT Long Term Goals - 02/05/17 1812      PT LONG TERM GOAL #1   Title Patient will have a decrease in L knee pain to 3/10 or less at most to promote ability to walk on uneven ground, perform transfers, participate in activities such as tennis and kayaking.    Baseline 6/10 L knee pain at most for the past month (takes ibuprofen), 02/05/2017   Time 6   Period Weeks   Status New     PT LONG TERM GOAL #2   Title Patient will improve bilateral LE strength by at least 1/2 MMT grade to promote ability to perform functional tasks with less bilateral knee pain.    Time 6   Period Weeks   Status New     PT LONG TERM GOAL #3   Title Patient will improve his LEFS score by at least 9 points as a demonstration of improved function.    Baseline 44/80 (02/05/2017)   Time 6   Period Weeks   Status New     PT LONG TERM GOAL #4   Title Pt will report being able to play at least 1 game of tennis with 3/10 L knee pain or less to promote ability to participate in recreational activities.    Baseline Unable to play tennis due to knee pain; 6/10 at most (02/05/2017)   Time 6   Period Weeks   Status New               Plan - 03/05/17 1635    Clinical Impression Statement Pt demonstrates consistent 1/10 medial L knee pain throughout session. Limited L knee extension ROM observed. Worked on increasing knee extension to see if it helps decrease pain with standing activities.    History and Personal Factors relevant to plan  of care: Chronicity of condition   Clinical Presentation Stable   Clinical Presentation due to: decreased starting knee pain with decreased use of ibuprofen   Clinical Decision Making Low   Rehab Potential Good  Clinical Impairments Affecting Rehab Potential (+) chronicity of condition; (+) motivated, active   PT Frequency 2x / week   PT Duration 6 weeks   PT Treatment/Interventions Therapeutic exercise;Therapeutic activities;Manual techniques;Aquatic Therapy;Electrical Stimulation;Iontophoresis 55m/ml Dexamethasone;Ultrasound;Neuromuscular re-education;Patient/family education;Dry needling   PT Next Visit Plan hip strengthening, femoral control, manual therapy and modalities PRN   Consulted and Agree with Plan of Care Patient      Patient will benefit from skilled therapeutic intervention in order to improve the following deficits and impairments:  Pain, Improper body mechanics, Difficulty walking, Decreased strength  Visit Diagnosis: Chronic pain of left knee  Muscle weakness (generalized)  Difficulty in walking, not elsewhere classified  Chronic pain of right knee     Problem List Patient Active Problem List   Diagnosis Date Noted  . Poison ivy 11/21/2016  . Gross hematuria 05/01/2016  . Weak urinary stream 05/01/2016  . Prostate cancer screening 05/01/2016  . Hematuria 03/06/2016  . Hx of adenomatous colonic polyps 12/05/2015  . Advance care planning 07/21/2014  . Medicare annual wellness visit, subsequent 07/01/2012  . External hemorrhoids 06/27/2011  . Hypothyroidism 02/24/2008  . HYPERCHOLESTEROLEMIA 02/21/2007  . HYPERTENSION, BENIGN ESSENTIAL 02/21/2007  . DEGENERATIVE JOINT DISEASE, CERVICAL SPINE 02/21/2007    MJoneen BoersPT, DPT   03/05/2017, 5:37 PM  [facility] [address] [place] [address]Phone: [phone]  Fax:  [phone] Name: James CARSTARPHENMRN: [id number]  of Birth: [birth year]

## 2017-03-07 ENCOUNTER — Ambulatory Visit: Payer: Medicare Other | Attending: Primary Care

## 2017-03-07 DIAGNOSIS — G8929 Other chronic pain: Secondary | ICD-10-CM | POA: Insufficient documentation

## 2017-03-07 DIAGNOSIS — M6281 Muscle weakness (generalized): Secondary | ICD-10-CM | POA: Diagnosis present

## 2017-03-07 DIAGNOSIS — M25561 Pain in right knee: Secondary | ICD-10-CM | POA: Diagnosis present

## 2017-03-07 DIAGNOSIS — M25562 Pain in left knee: Secondary | ICD-10-CM | POA: Insufficient documentation

## 2017-03-07 DIAGNOSIS — R262 Difficulty in walking, not elsewhere classified: Secondary | ICD-10-CM

## 2017-03-07 NOTE — Therapy (Signed)
[place] [facility] [address] [place], [address] Phone: [phone]   Fax:  [phone]  Physical Therapy Treatment And Progress Report (G-code)  Patient Details  Name: James [name] MRN: [id number] Date of Birth: [birth year] Referring Provider: Pleas Koch, NP  Encounter Date: 03/07/2017      PT End of Session - 03/07/17 1633    Visit Number 10   Number of Visits 13   Date for PT Re-Evaluation 03/21/17   Authorization Type 1   Authorization Time Period of 10 g-code   PT Start Time 1634   PT Stop Time 1718   PT Time Calculation (min) 44 min   Activity Tolerance Patient tolerated treatment well   Behavior During Therapy [facility] for tasks assessed/performed      Past Medical History:  Diagnosis Date  . Acute lower GI bleeding after colonoscopy and polypectomy, resolved 12/12/2015  . Cataract    bilateral surgery to remove  . Colon polyp   . Constipation   . Hemorrhoids   . Hepatitis    self resolved, likely food exposure, 1974.   Marland Kitchen Hx of adenomatous colonic polyps 12/05/2015  . Hyperlipidemia   . Hypertension   . Hypothyroidism   . Skin cancer    basal cell, R ear, MOHS    Past Surgical History:  Procedure Laterality Date  . BROW LIFT Bilateral 10/23/2016   Procedure: BLEPHAROPLASTY upper eyelid with excess skin;  Surgeon: Karle Starch, MD;  Location: [place];  Service: Ophthalmology;  Laterality: Bilateral;  MAC  . CATARACT EXTRACTION  1986   OD  . CATARACT EXTRACTION Bilateral 1995   x 2 for right and left   . COLONOSCOPY    . ECTROPION REPAIR Bilateral 10/23/2016   Procedure: REPAIR OF ECTROPION sutures, extensive;  Surgeon: Karle Starch, MD;  Location: [place];  Service: Ophthalmology;  Laterality: Bilateral;  . LIPOMA EXCISION  06/1997   left neck (Juengel)  . MOHS SURGERY Right 2013   behind right ear  . TONSILLECTOMY    . VASECTOMY  1982    There were no  vitals filed for this visit.      Subjective Assessment - 03/07/17 1635    Subjective Has not had Ibuprofen since this past Sunday (4 days ago). L knee is not bad. 1/10 L knee currently. Wakes up around 4 am with R knee hurning.  Does not use a pillow between the knees but will try it.  Ends up sleeping on his L side.  Felt L knee pain after walking 200 yards on uneven surface (ditch on the side of the road) which bothered his L knee (3/10).  Knee pain went away after 3-4 hours.  Has been taking ibuprofen strongly (3 pills for 3 times a day) before starting PT.   3/10 L knee pain at most for the past 7 days.    Pertinent History Bilateral knee pain. Pt states having pain in his L knee currently. Has pain in his R knee at times. Has always had knee problems due to tennis. Pain worsened during the spring of 2018. Has always taken ibuprofen  which helps but takes a higher dose now due to increased pain. Denies locking sensation bilateral knees.    Patient Stated Goals I'd love to get back to playing tennis, improve mobility (being able to go up and down from the floor or chair easier), be able to load and unload his  kayak, get into and out of his kayak.    Currently in Pain? Yes   Pain Score 1    Pain Onset More than a month ago            [facility] PT Assessment - 03/07/17 1727      Observation/Other Assessments   Lower Extremity Functional Scale  53/80     Strength   Right Hip Extension 4/5   Right Hip External Rotation  4/5   Right Hip ABduction 4+/5   Left Hip Extension 4+/5   Left Hip External Rotation 4/5   Left Hip ABduction 4+/5   Right Knee Flexion 4/5   Right Knee Extension 5/5   Left Knee Flexion 4+/5   Left Knee Extension 5/5                             PT Education - 03/07/17 1703    Education provided Yes   Education Details ther-ex   Person(s) Educated Patient   Methods Explanation;Demonstration;Tactile cues;Verbal cues   Comprehension Returned  demonstration;Verbalized understanding        Objectives    There-ex  Seated manually resisted knee flexion, knee extension, hip ER, S/L hip abduction, prone hip extension 1-2x each way for each LE. Reviewed progress/current status with strength with pt.  Forward step onto and over Air Ex pad with L LE, no UE assist 10x2 Bilateral gastroc stretch at stair step 30 seconds x 4  Limited L knee extension compared to R knee extension  Muscle energy technique to promote L knee extension AROM   Forward step up onto bosu with L LE and R UE assist 10x  L knee pressure  Side stepping on Air Ex beam with bilateral UE assist 5 x  Then without UE assist 5x. L medial knee discomfort which eased with addition of glute max contraction.    Standing hip machine:  Hip extension plate 70 for [id number] LE   Improved exercise technique, movement at target joints, use of target muscles after min to mod verbal, visual, tactile cues.    Pt demonstrates improved bilateral glute med, max and L hamstring strength, improved function (improved LEFS score) and overall decreased L knee pain to 3/10 at worst with pt not taking as much ibuprofen as before since initial evaluation. Patient making progress with physical therapy towards goals. He still presents with LE weakness, L knee pain, and difficulty performing functional tasks such as walking on uneven surfaces due to knee pain and will benefit from continued skilled physical therapy services to address the aforementioned deficits.          PT Long Term Goals - 03/07/17 1733      PT LONG TERM GOAL #1   Title Patient will have a decrease in L knee pain to 3/10 or less at most to promote ability to walk on uneven ground, perform transfers, participate in activities such as tennis and kayaking.    Baseline 6/10 L knee pain at most for the past month (takes ibuprofen), 02/05/2017; 3/10 L knee pain at most for the past 7 days (03/07/2017)    Time 2   Period Weeks   Status Partially Met   Target Date 03/21/17     PT LONG TERM GOAL #2   Title Patient will improve bilateral LE strength by at least 1/2 MMT grade to promote ability to perform functional tasks with less bilateral knee pain.  Time 2   Period Weeks   Status Partially Met   Target Date 03/21/17     PT LONG TERM GOAL #3   Title Patient will improve his LEFS score by at least 9 points as a demonstration of improved function.    Baseline 44/80 (02/05/2017); 53/80 (2017-04-04)   Time 2   Period Weeks   Status Partially Met   Target Date 03/21/17     PT LONG TERM GOAL #4   Title Pt will report being able to play at least 1 game of tennis with 3/10 L knee pain or less to promote ability to participate in recreational activities.    Baseline Unable to play tennis due to knee pain; 6/10 at most (02/05/2017); Pt has not played tennis yet (April 04, 2017)   Time 6   Period Weeks   Status On-going   Target Date 03/21/17               Plan - 04/04/17 1630    Clinical Impression Statement Pt demonstrates improved bilateral glute med, max and L hamstring strength, improved function (improved LEFS score) and overall decreased L knee pain to 3/10 at worst with pt not taking as much ibuprofen as before since initial evaluation. Patient making progress with physical therapy towards goals. He still presents with LE weakness, L knee pain, and difficulty performing functional tasks such as walking on uneven surfaces due to knee pain and will benefit from continued skilled physical therapy services to address the aforementioned deficits.    History and Personal Factors relevant to plan of care: Chronicity of condition   Clinical Presentation Stable   Clinical Presentation due to: Decreasing pain in L knee even with decreased ibuprofen intake.    Clinical Decision Making Low   Rehab Potential Good   Clinical Impairments Affecting Rehab Potential (+) chronicity of condition; (+)  motivated, active   PT Frequency 2x / week   PT Duration 6 weeks   PT Treatment/Interventions Therapeutic exercise;Therapeutic activities;Manual techniques;Aquatic Therapy;Electrical Stimulation;Iontophoresis 46m/ml Dexamethasone;Ultrasound;Neuromuscular re-education;Patient/family education;Dry needling   PT Next Visit Plan hip strengthening, femoral control, manual therapy and modalities PRN   Consulted and Agree with Plan of Care Patient      Patient will benefit from skilled therapeutic intervention in order to improve the following deficits and impairments:  Pain, Improper body mechanics, Difficulty walking, Decreased strength  Visit Diagnosis: Chronic pain of left knee  Muscle weakness (generalized)  Difficulty in walking, not elsewhere classified       G-Codes - [id number]    Functional Assessment Tool Used (Outpatient Only) LEFS, clinical presentation, patient interview   Functional Limitation Mobility: Walking and moving around   Mobility: Walking and Moving Around Current Status (([id number] At least 20 percent but less than 40 percent impaired, limited or restricted   Mobility: Walking and Moving Around Goal Status [phone] At least 1 percent but less than 20 percent impaired, limited or restricted      Problem List Patient Active Problem List   Diagnosis Date Noted  . Poison ivy 11/21/2016  . Gross hematuria 05/01/2016  . Weak urinary stream 05/01/2016  . Prostate cancer screening 05/01/2016  . Hematuria 03/06/2016  . Hx of adenomatous colonic polyps 12/05/2015  . Advance care planning 07/21/2014  . Medicare annual wellness visit, subsequent 07/01/2012  . External hemorrhoids 06/27/2011  . Hypothyroidism 02/24/2008  . HYPERCHOLESTEROLEMIA 02/21/2007  . HYPERTENSION, BENIGN ESSENTIAL 02/21/2007  . DEGENERATIVE JOINT DISEASE, CERVICAL SPINE 02/21/2007   Thank you for your  referral.  Joneen Boers PT, DPT   03/07/2017, 5:42 PM  [place] [facility] [address] [place], [address] Phone: [phone]   Fax:  [phone]  Name: James [name] MRN: [id number] Date of Birth: January 13, 1946

## 2017-03-07 NOTE — Patient Instructions (Signed)
Gave standing wall gastroc stretch 30 seconds 4-5x each LE per day as part of his HEP. Pt demonstrated and verbalized understanding.

## 2017-03-08 NOTE — Progress Notes (Signed)
FYI

## 2017-03-12 ENCOUNTER — Ambulatory Visit: Payer: Medicare Other

## 2017-03-12 DIAGNOSIS — M6281 Muscle weakness (generalized): Secondary | ICD-10-CM

## 2017-03-12 DIAGNOSIS — M25562 Pain in left knee: Principal | ICD-10-CM

## 2017-03-12 DIAGNOSIS — R262 Difficulty in walking, not elsewhere classified: Secondary | ICD-10-CM

## 2017-03-12 DIAGNOSIS — G8929 Other chronic pain: Secondary | ICD-10-CM

## 2017-03-12 NOTE — Therapy (Signed)
[place] [facility] [address], [place], [address] Phone: [phone]   Fax:  [phone]  Physical Therapy Treatment  Patient Details  Name: James [name] MRN: [id number] Date of Birth: 1946/01/17 Referring Provider: Pleas Koch, NP  Encounter Date: 03/12/2017      PT End of Session - 03/12/17 1550    Visit Number 10   Number of Visits 13   Date for PT Re-Evaluation 03/21/17   Authorization Type 2   Authorization Time Period of 10 g-code   PT Start Time 8182   PT Stop Time 1633   PT Time Calculation (min) 43 min   Activity Tolerance Patient tolerated treatment well   Behavior During Therapy [facility] for tasks assessed/performed      Past Medical History:  Diagnosis Date  . Acute lower GI bleeding after colonoscopy and polypectomy, resolved 12/12/2015  . Cataract    bilateral surgery to remove  . Colon polyp   . Constipation   . Hemorrhoids   . Hepatitis    self resolved, likely food exposure, 1974.   Marland Kitchen Hx of adenomatous colonic polyps 12/05/2015  . Hyperlipidemia   . Hypertension   . Hypothyroidism   . Skin cancer    basal cell, R ear, MOHS    Past Surgical History:  Procedure Laterality Date  . BROW LIFT Bilateral 10/23/2016   Procedure: BLEPHAROPLASTY upper eyelid with excess skin;  Surgeon: Karle Starch, MD;  Location: [place];  Service: Ophthalmology;  Laterality: Bilateral;  MAC  . CATARACT EXTRACTION  1986   OD  . CATARACT EXTRACTION Bilateral 1995   x 2 for right and left   . COLONOSCOPY    . ECTROPION REPAIR Bilateral 10/23/2016   Procedure: REPAIR OF ECTROPION sutures, extensive;  Surgeon: Karle Starch, MD;  Location: [place];  Service: Ophthalmology;  Laterality: Bilateral;  . LIPOMA EXCISION  06/1997   left neck (Juengel)  . MOHS SURGERY Right 2013   behind right ear  . TONSILLECTOMY    . VASECTOMY  1982    There were no vitals filed for this visit.       Subjective Assessment - 03/12/17 1551    Subjective Has not taken ibuprofen since last session. L knee is a 1/10 currently, 2/10 at most for the past 7 days. Pt states that his knee is better now compared to before he started.    Pertinent History Bilateral knee pain. Pt states having pain in his L knee currently. Has pain in his R knee at times. Has always had knee problems due to tennis. Pain worsened during the spring of 2018. Has always taken ibuprofen  which helps but takes a higher dose now due to increased pain. Denies locking sensation bilateral knees.    Patient Stated Goals I'd love to get back to playing tennis, improve mobility (being able to go up and down from the floor or chair easier), be able to load and unload his kayak, get into and out of his kayak.    Currently in Pain? Yes   Pain Score 1    Pain Onset More than a month ago                                 PT Education - 03/12/17 1618    Education provided Yes   Education Details ther-ex    Person(s)  Educated Patient   Methods Explanation;Demonstration;Tactile cues;Verbal cues   Comprehension Returned demonstration;Verbalized understanding        Objectives  Pt states that is knee is better now compared to before he started.   -15 degrees seated L knee extension AROM  There-ex  Bilateral gastroc stretch at stair step 30 seconds x 3  Muscle energy technique to promote L knee extension AROM   Improve L knee extension AROM to -12 degrees  Standing hip machine:  Hip extension plate 70 for [id number] LE   Forward step onto and over Air Ex pad with L LE, no UE assist 10x  Then 5x with glute max squeeze  Then 5x with one UE assist   Forward step up onto Dyna disc with L LE 5x5 with R UE assist  Forward weight shifting onto Dyna disc with L foot 5x5 seconds  Seated L knee extension resisting 7 lbs 10x3  Pt was recommended to use ice to his knee at home. Pt  verbalized understanding.    Standing hip machine: L hip abductoin plate 25 for [id number] (easy) Try increasing weight next visit   R hip abduction plate 40 for 67H4  Forward static mini lunge L foot onto 2nd stair step 5x5 seconds to promote glute muscle use.   Seated hip extension isometrics on table with bilateral UE assist 5x5 seconds each LE for 2 sets to promote glute max muscle use in non-weight bearing.    Improved exercise technique, movement at target joints, use of target muscles after min to mod verbal, visual, tactile cues.      Pt continues to report 1/10 starting knee pain. Decreased L knee pain at worst to 2/10 for the past 7 days without taking ibuprofen. Patient making progress towards pain goal. Continue working on knee extension ROM, hip and quadriceps strengthening in both weight bearing and non-weight bearing positions. Pt tolerated session well without aggravation of symptoms.         PT Long Term Goals - 03/07/17 1733      PT LONG TERM GOAL #1   Title Patient will have a decrease in L knee pain to 3/10 or less at most to promote ability to walk on uneven ground, perform transfers, participate in activities such as tennis and kayaking.    Baseline 6/10 L knee pain at most for the past month (takes ibuprofen), 02/05/2017; 3/10 L knee pain at most for the past 7 days (03/07/2017)   Time 2   Period Weeks   Status Partially Met   Target Date 03/21/17     PT LONG TERM GOAL #2   Title Patient will improve bilateral LE strength by at least 1/2 MMT grade to promote ability to perform functional tasks with less bilateral knee pain.    Time 2   Period Weeks   Status Partially Met   Target Date 03/21/17     PT LONG TERM GOAL #3   Title Patient will improve his LEFS score by at least 9 points as a demonstration of improved function.    Baseline 44/80 (02/05/2017); 53/80 (03/07/2017)   Time 2   Period Weeks   Status Partially Met   Target Date 03/21/17     PT LONG TERM  GOAL #4   Title Pt will report being able to play at least 1 game of tennis with 3/10 L knee pain or less to promote ability to participate in recreational activities.    Baseline Unable to play tennis due to  knee pain; 6/10 at most (02/05/2017); Pt has not played tennis yet (03/07/2017)   Time 6   Period Weeks   Status On-going   Target Date 03/21/17               Plan - 03/12/17 1619    Clinical Impression Statement Pt continues to report 1/10 starting knee pain. Decreased L knee pain at worst to 2/10 for the past 7 days without taking ibuprofen. Patient making progress towards pain goal. Continue working on knee extension ROM, hip and quadriceps strengthening in both weight bearing and non-weight bearing positions. Pt tolerated session well without aggravation of symptoms.    History and Personal Factors relevant to plan of care: chronicity of condition   Clinical Presentation Stable   Clinical Presentation due to: decreasing pain in L knee even with decreased ibuprofen intake   Clinical Decision Making Low   Rehab Potential Good   Clinical Impairments Affecting Rehab Potential (+) chronicity of condition; (+) motivated, active   PT Frequency 2x / week   PT Duration 6 weeks   PT Treatment/Interventions Therapeutic exercise;Therapeutic activities;Manual techniques;Aquatic Therapy;Electrical Stimulation;Iontophoresis 81m/ml Dexamethasone;Ultrasound;Neuromuscular re-education;Patient/family education;Dry needling   PT Next Visit Plan hip strengthening, femoral control, manual therapy and modalities PRN   Consulted and Agree with Plan of Care Patient      Patient will benefit from skilled therapeutic intervention in order to improve the following deficits and impairments:  Pain, Improper body mechanics, Difficulty walking, Decreased strength  Visit Diagnosis: Chronic pain of left knee  Muscle weakness (generalized)  Difficulty in walking, not elsewhere classified     Problem  List Patient Active Problem List   Diagnosis Date Noted  . Poison ivy 11/21/2016  . Gross hematuria 05/01/2016  . Weak urinary stream 05/01/2016  . Prostate cancer screening 05/01/2016  . Hematuria 03/06/2016  . Hx of adenomatous colonic polyps 12/05/2015  . Advance care planning 07/21/2014  . Medicare annual wellness visit, subsequent 07/01/2012  . External hemorrhoids 06/27/2011  . Hypothyroidism 02/24/2008  . HYPERCHOLESTEROLEMIA 02/21/2007  . HYPERTENSION, BENIGN ESSENTIAL 02/21/2007  . DEGENERATIVE JOINT DISEASE, CERVICAL SPINE 02/21/2007    MJoneen BoersPT, DPT   03/12/2017, 7:28 PM  [place] [facility] [address] [place] [address]Phone: [phone]  Fax:  [phone] Name: TCANTON YEARBYMRN: [id number] of Birth: [birth year]

## 2017-03-14 ENCOUNTER — Ambulatory Visit: Payer: Medicare Other

## 2017-03-14 DIAGNOSIS — M6281 Muscle weakness (generalized): Secondary | ICD-10-CM

## 2017-03-14 DIAGNOSIS — G8929 Other chronic pain: Secondary | ICD-10-CM

## 2017-03-14 DIAGNOSIS — R262 Difficulty in walking, not elsewhere classified: Secondary | ICD-10-CM

## 2017-03-14 DIAGNOSIS — M25562 Pain in left knee: Principal | ICD-10-CM

## 2017-03-14 NOTE — Patient Instructions (Signed)
Sitting: raising arch of foot 10x5 seconds for 2 sets   Reviewed and given as part of his HEP. Pt demonstrated and verbalized understanding.

## 2017-03-14 NOTE — Therapy (Signed)
[place] [facility] [address] [place], [address] Phone: [phone]   Fax:  [phone]  Physical Therapy Treatment  Patient Details  Name: James [name] MRN: [id number] Date of Birth: [birth year] Referring Provider: Pleas Koch, NP  Encounter Date: 03/14/2017      PT End of Session - 03/14/17 1548    Visit Number 11   Number of Visits 13   Date for PT Re-Evaluation 03/21/17   Authorization Type 3   Authorization Time Period of 10 g-code   PT Start Time 1548   PT Stop Time 1629   PT Time Calculation (min) 41 min   Activity Tolerance Patient tolerated treatment well   Behavior During Therapy [facility] for tasks assessed/performed      Past Medical History:  Diagnosis Date  . Acute lower GI bleeding after colonoscopy and polypectomy, resolved 12/12/2015  . Cataract    bilateral surgery to remove  . Colon polyp   . Constipation   . Hemorrhoids   . Hepatitis    self resolved, likely food exposure, 1974.   Marland Kitchen Hx of adenomatous colonic polyps 12/05/2015  . Hyperlipidemia   . Hypertension   . Hypothyroidism   . Skin cancer    basal cell, R ear, MOHS    Past Surgical History:  Procedure Laterality Date  . BROW LIFT Bilateral 10/23/2016   Procedure: BLEPHAROPLASTY upper eyelid with excess skin;  Surgeon: Karle Starch, MD;  Location: [place];  Service: Ophthalmology;  Laterality: Bilateral;  MAC  . CATARACT EXTRACTION  1986   OD  . CATARACT EXTRACTION Bilateral 1995   x 2 for right and left   . COLONOSCOPY    . ECTROPION REPAIR Bilateral 10/23/2016   Procedure: REPAIR OF ECTROPION sutures, extensive;  Surgeon: Karle Starch, MD;  Location: [place];  Service: Ophthalmology;  Laterality: Bilateral;  . LIPOMA EXCISION  06/1997   left neck (Juengel)  . MOHS SURGERY Right 2013   behind right ear  . TONSILLECTOMY    . VASECTOMY  1982    There were no vitals filed for this visit.       Subjective Assessment - 03/14/17 1549    Subjective L knee is doing very good. 0/10 currently. Felt knee pain when walking barefoot at home. No knee pain when walking with his shoes with the arch supprot inside.  Has not taken ibuprofen in about a week.    Pertinent History Bilateral knee pain. Pt states having pain in his L knee currently. Has pain in his R knee at times. Has always had knee problems due to tennis. Pain worsened during the spring of 2018. Has always taken ibuprofen  which helps but takes a higher dose now due to increased pain. Denies locking sensation bilateral knees.    Patient Stated Goals I'd love to get back to playing tennis, improve mobility (being able to go up and down from the floor or chair easier), be able to load and unload his kayak, get into and out of his kayak.    Currently in Pain? No/denies   Pain Score 0-No pain   Pain Onset More than a month ago                                 PT Education - 03/14/17 1558    Education provided Yes   Education Details ther-ex,  HEP   Person(s) Educated Patient   Methods Explanation;Demonstration;Tactile cues;Verbal cues   Comprehension Returned demonstration;Verbalized understanding        Objectives  R knee is no problem    There-ex  Sitting: raising arch of foot 10x5 seconds for 2 sets Bilateral gastroc stretch at stair step 30 seconds x 3  Muscle energy technique to promote L knee extension AROM   Standing ankle DF/PF on rocker board x 2 min to promote knee extension   Seated L knee extension resisting 7 lbs 10x3  Standing hip machine:  Hip extension plate 70 for [id number] LE   Forward step up onto Dyna disc with L LE 5x5 seconds with R UE assist  Forward weight shifting onto Dyna disc with L foot 5x5 seconds, then 10x5 seconds   Standing hip machine: L hip abduction plate 40 for 65K3                         R hip abduction plate 40 for  54S5     Improved exercise technique, movement at target joints, use of target muscles after min to mod verbal, visual, tactile cues.     Pt continues to make very good progress with decreased knee pain from one session to the next and without the use of medication. Worked on improving strength at the muscles of the arch of his foot to help decrease knee pain when walking barefoot at home as well as improving knee extension ROM and quadriceps and hip strength         PT Long Term Goals - 03/07/17 1733      PT LONG TERM GOAL #1   Title Patient will have a decrease in L knee pain to 3/10 or less at most to promote ability to walk on uneven ground, perform transfers, participate in activities such as tennis and kayaking.    Baseline 6/10 L knee pain at most for the past month (takes ibuprofen), 02/05/2017; 3/10 L knee pain at most for the past 7 days (03/07/2017)   Time 2   Period Weeks   Status Partially Met   Target Date 03/21/17     PT LONG TERM GOAL #2   Title Patient will improve bilateral LE strength by at least 1/2 MMT grade to promote ability to perform functional tasks with less bilateral knee pain.    Time 2   Period Weeks   Status Partially Met   Target Date 03/21/17     PT LONG TERM GOAL #3   Title Patient will improve his LEFS score by at least 9 points as a demonstration of improved function.    Baseline 44/80 (02/05/2017); 53/80 (03/07/2017)   Time 2   Period Weeks   Status Partially Met   Target Date 03/21/17     PT LONG TERM GOAL #4   Title Pt will report being able to play at least 1 game of tennis with 3/10 L knee pain or less to promote ability to participate in recreational activities.    Baseline Unable to play tennis due to knee pain; 6/10 at most (02/05/2017); Pt has not played tennis yet (03/07/2017)   Time 6   Period Weeks   Status On-going   Target Date 03/21/17               Plan - 03/14/17 1601    Clinical Impression Statement Pt continues to  make very good progress with decreased knee pain from  one session to the next and without the use of medication. Worked on improving strength at the muscles of the arch of his foot to help decrease knee pain when walking barefoot at home as well as improving knee extension ROM and quadriceps and hip strength   History and Personal Factors relevant to plan of care: Chronicity of condition   Clinical Presentation Stable   Clinical Presentation due to: continued decreased knee pain, good carryover from previous session   Clinical Decision Making Low   Rehab Potential Good   Clinical Impairments Affecting Rehab Potential (+) chronicity of condition; (+) motivated, active   PT Frequency 2x / week   PT Duration 6 weeks   PT Treatment/Interventions Therapeutic exercise;Therapeutic activities;Manual techniques;Aquatic Therapy;Electrical Stimulation;Iontophoresis 70m/ml Dexamethasone;Ultrasound;Neuromuscular re-education;Patient/family education;Dry needling   PT Next Visit Plan hip strengthening, femoral control, manual therapy and modalities PRN   Consulted and Agree with Plan of Care Patient      Patient will benefit from skilled therapeutic intervention in order to improve the following deficits and impairments:  Pain, Improper body mechanics, Difficulty walking, Decreased strength  Visit Diagnosis: Chronic pain of left knee  Muscle weakness (generalized)  Difficulty in walking, not elsewhere classified     Problem List Patient Active Problem List   Diagnosis Date Noted  . Poison ivy 11/21/2016  . Gross hematuria 05/01/2016  . Weak urinary stream 05/01/2016  . Prostate cancer screening 05/01/2016  . Hematuria 03/06/2016  . Hx of adenomatous colonic polyps 12/05/2015  . Advance care planning 07/21/2014  . Medicare annual wellness visit, subsequent 07/01/2012  . External hemorrhoids 06/27/2011  . Hypothyroidism 02/24/2008  . HYPERCHOLESTEROLEMIA 02/21/2007  . HYPERTENSION, BENIGN  ESSENTIAL 02/21/2007  . DEGENERATIVE JOINT DISEASE, CERVICAL SPINE 02/21/2007   MJoneen BoersPT, DPT   03/14/2017, 6:23 PM  [facility] [address] [place] [address]Phone: [phone]  Fax:  [phone] Name: TDENE NAZIRMRN: [id number] of Birth: 108-21-1947

## 2017-03-19 ENCOUNTER — Ambulatory Visit: Payer: Medicare Other

## 2017-03-19 DIAGNOSIS — M25562 Pain in left knee: Principal | ICD-10-CM

## 2017-03-19 DIAGNOSIS — G8929 Other chronic pain: Secondary | ICD-10-CM

## 2017-03-19 DIAGNOSIS — R262 Difficulty in walking, not elsewhere classified: Secondary | ICD-10-CM

## 2017-03-19 DIAGNOSIS — M6281 Muscle weakness (generalized): Secondary | ICD-10-CM

## 2017-03-19 NOTE — Therapy (Signed)
[facility] [address], [place], [address] Phone: [phone]   Fax:  [phone]  Physical Therapy Treatment  Patient Details  Name: James [name] MRN: [id number] Date of Birth: May 10, 1946 Referring Provider: Pleas Koch, NP  Encounter Date: 03/19/2017      PT End of Session - 03/19/17 1547    Visit Number 12   Number of Visits 13   Date for PT Re-Evaluation 03/21/17   Authorization Type 4   Authorization Time Period of 10 g-code   PT Start Time 2951   PT Stop Time 1632   PT Time Calculation (min) 45 min   Activity Tolerance Patient tolerated treatment well   Behavior During Therapy [facility] for tasks assessed/performed      Past Medical History:  Diagnosis Date  . Acute lower GI bleeding after colonoscopy and polypectomy, resolved 12/12/2015  . Cataract    bilateral surgery to remove  . Colon polyp   . Constipation   . Hemorrhoids   . Hepatitis    self resolved, likely food exposure, 1974.   Marland Kitchen Hx of adenomatous colonic polyps 12/05/2015  . Hyperlipidemia   . Hypertension   . Hypothyroidism   . Skin cancer    basal cell, R ear, MOHS    Past Surgical History:  Procedure Laterality Date  . BROW LIFT Bilateral 10/23/2016   Procedure: BLEPHAROPLASTY upper eyelid with excess skin;  Surgeon: Karle Starch, MD;  Location: [place];  Service: Ophthalmology;  Laterality: Bilateral;  MAC  . CATARACT EXTRACTION  1986   OD  . CATARACT EXTRACTION Bilateral 1995   x 2 for right and left   . COLONOSCOPY    . ECTROPION REPAIR Bilateral 10/23/2016   Procedure: REPAIR OF ECTROPION sutures, extensive;  Surgeon: Karle Starch, MD;  Location: [place];  Service: Ophthalmology;  Laterality: Bilateral;  . LIPOMA EXCISION  06/1997   left neck (Juengel)  . MOHS SURGERY Right 2013   behind right ear  . TONSILLECTOMY    . VASECTOMY  1982    There were no vitals filed for this visit.       Subjective Assessment - 03/19/17 1547    Subjective L knee is doing fine. No pain at all. Has not taken ibuprofen in the past 2 weeks. 2/10 L knee pain for the past 7 days.  Feels like he is good to continue with his HEP after his next visit.    Pertinent History Bilateral knee pain. Pt states having pain in his L knee currently. Has pain in his R knee at times. Has always had knee problems due to tennis. Pain worsened during the spring of 2018. Has always taken ibuprofen  which helps but takes a higher dose now due to increased pain. Denies locking sensation bilateral knees.    Patient Stated Goals I'd love to get back to playing tennis, improve mobility (being able to go up and down from the floor or chair easier), be able to load and unload his kayak, get into and out of his kayak.    Currently in Pain? No/denies   Pain Score 0-No pain   Pain Onset More than a month ago                                 PT Education - 03/19/17 1603    Education provided Yes  Education Details ther-ex   Person(s) Educated Patient   Methods Explanation;Demonstration;Tactile cues;Verbal cues   Comprehension Returned demonstration;Verbalized understanding         Objectives    There-ex   Muscle energy technique to promote L knee extension AROM   -10 degrees seated L knee extension AROM afterwards  Sitting: raising arch of foot 10x5 seconds for 2 sets   Standing hip machine:  Hip extension plate 70 for [id number] LE   Bilateral gastroc stretch at stair step 30 seconds x 3  Standing hip machine: L hip abduction plate 40 for [id number] R hip abduction plate 40 for [id number]   SLS on L LE with R foot on bosu: 1 kg ball toss to trampoline 20x2  Then with R foot on upside down bosu 20 throws x 2   Standing ankle DF/PF on rocker board x 2 min to promote knee extension   Seated hip adduction ball squeeze with glute max squeeze and  isometric knee extension 10x5 seconds for 2 sets.   Decreased L knee discomfort with forward weight shifting onto Dyna disc  Forward weight shifting onto Dyna disc with L foot 10x5 seconds for 2 sets   Forward step up onto Dyna disc with L LE 5x5 seconds for 2 sets with R UE assist     Improved exercise technique, movement at target joints, use of target muscles after min to mod verbal, visual, tactile cues.   Pt continues to demonstrate decreased L knee pain and without using pain medication. Pt also demonstrates improved seated L knee extension AROM following muscle energy technique to improve ROM. Continued working on hip strengthening, and knee extension and control over uneven surfaces to promote ability to perform standing tasks with less L knee pain.           PT Long Term Goals - 03/07/17 1733      PT LONG TERM GOAL #1   Title Patient will have a decrease in L knee pain to 3/10 or less at most to promote ability to walk on uneven ground, perform transfers, participate in activities such as tennis and kayaking.    Baseline 6/10 L knee pain at most for the past month (takes ibuprofen), 02/05/2017; 3/10 L knee pain at most for the past 7 days (03/07/2017)   Time 2   Period Weeks   Status Partially Met   Target Date 03/21/17     PT LONG TERM GOAL #2   Title Patient will improve bilateral LE strength by at least 1/2 MMT grade to promote ability to perform functional tasks with less bilateral knee pain.    Time 2   Period Weeks   Status Partially Met   Target Date 03/21/17     PT LONG TERM GOAL #3   Title Patient will improve his LEFS score by at least 9 points as a demonstration of improved function.    Baseline 44/80 (02/05/2017); 53/80 (03/07/2017)   Time 2   Period Weeks   Status Partially Met   Target Date 03/21/17     PT LONG TERM GOAL #4   Title Pt will report being able to play at least 1 game of tennis with 3/10 L knee pain or less to promote ability to participate in  recreational activities.    Baseline Unable to play tennis due to knee pain; 6/10 at most (02/05/2017); Pt has not played tennis yet (03/07/2017)   Time 6   Period Weeks   Status On-going  Target Date 03/21/17               Plan - 03/19/17 1603    Clinical Impression Statement Pt continues to demonstrate decreased L knee pain and without using pain medication. Pt also demonstrates improved seated L knee extension AROM following muscle energy technique to improve ROM. Continued working on hip strengthening, and knee extension and control over uneven surfaces to promote ability to perform standing tasks with less L knee pain.    History and Personal Factors relevant to plan of care: Chronicity of condition   Clinical Presentation Stable   Clinical Presentation due to: decreased L knee pain to 2/10 at most without medication    Clinical Decision Making Low   Rehab Potential Good   Clinical Impairments Affecting Rehab Potential (+) chronicity of condition; (+) motivated, active   PT Frequency 2x / week   PT Duration 6 weeks   PT Treatment/Interventions Therapeutic exercise;Therapeutic activities;Manual techniques;Aquatic Therapy;Electrical Stimulation;Iontophoresis 4m/ml Dexamethasone;Ultrasound;Neuromuscular re-education;Patient/family education;Dry needling   PT Next Visit Plan hip strengthening, femoral control, manual therapy and modalities PRN   Consulted and Agree with Plan of Care Patient      Patient will benefit from skilled therapeutic intervention in order to improve the following deficits and impairments:  Pain, Improper body mechanics, Difficulty walking, Decreased strength  Visit Diagnosis: Chronic pain of left knee  Muscle weakness (generalized)  Difficulty in walking, not elsewhere classified     Problem List Patient Active Problem List   Diagnosis Date Noted  . Poison ivy 11/21/2016  . Gross hematuria 05/01/2016  . Weak urinary stream 05/01/2016  .  Prostate cancer screening 05/01/2016  . Hematuria 03/06/2016  . Hx of adenomatous colonic polyps 12/05/2015  . Advance care planning 07/21/2014  . Medicare annual wellness visit, subsequent 07/01/2012  . External hemorrhoids 06/27/2011  . Hypothyroidism 02/24/2008  . HYPERCHOLESTEROLEMIA 02/21/2007  . HYPERTENSION, BENIGN ESSENTIAL 02/21/2007  . DEGENERATIVE JOINT DISEASE, CERVICAL SPINE 02/21/2007    MJoneen BoersPT, DPT   03/19/2017, 7:08 PM  [facility] [address] [place] [address]Phone: [phone]  Fax:  [phone] Name: TLORRAINE TERRIQUEZMRN: [id number] of Birth: 109-26-1947

## 2017-03-21 ENCOUNTER — Ambulatory Visit: Payer: Medicare Other

## 2017-03-21 DIAGNOSIS — M6281 Muscle weakness (generalized): Secondary | ICD-10-CM

## 2017-03-21 DIAGNOSIS — G8929 Other chronic pain: Secondary | ICD-10-CM

## 2017-03-21 DIAGNOSIS — M25562 Pain in left knee: Secondary | ICD-10-CM | POA: Diagnosis not present

## 2017-03-21 DIAGNOSIS — R262 Difficulty in walking, not elsewhere classified: Secondary | ICD-10-CM

## 2017-03-21 DIAGNOSIS — M25561 Pain in right knee: Secondary | ICD-10-CM

## 2017-03-21 NOTE — Therapy (Signed)
[place] [facility] [address], [place], [address] Phone: [phone]   Fax:  [phone]  Physical Therapy Treatment And Discharge Summary  Patient Details  Name: James [name] MRN: [id number] Date of Birth: 1946-06-09 Referring Provider: Pleas Koch, NP  Encounter Date: 03/21/2017      PT End of Session - 03/21/17 0809    Visit Number 13   Number of Visits 13   Date for PT Re-Evaluation 03/21/17   Authorization Type 5   Authorization Time Period of 10 g-code   PT Start Time 0809   PT Stop Time 0851   PT Time Calculation (min) 42 min   Activity Tolerance Patient tolerated treatment well   Behavior During Therapy [facility] for tasks assessed/performed      Past Medical History:  Diagnosis Date  . Acute lower GI bleeding after colonoscopy and polypectomy, resolved 12/12/2015  . Cataract    bilateral surgery to remove  . Colon polyp   . Constipation   . Hemorrhoids   . Hepatitis    self resolved, likely food exposure, 1974.   Marland Kitchen Hx of adenomatous colonic polyps 12/05/2015  . Hyperlipidemia   . Hypertension   . Hypothyroidism   . Skin cancer    basal cell, R ear, MOHS    Past Surgical History:  Procedure Laterality Date  . BROW LIFT Bilateral 10/23/2016   Procedure: BLEPHAROPLASTY upper eyelid with excess skin;  Surgeon: Karle Starch, MD;  Location: [place];  Service: Ophthalmology;  Laterality: Bilateral;  MAC  . CATARACT EXTRACTION  1986   OD  . CATARACT EXTRACTION Bilateral 1995   x 2 for right and left   . COLONOSCOPY    . ECTROPION REPAIR Bilateral 10/23/2016   Procedure: REPAIR OF ECTROPION sutures, extensive;  Surgeon: Karle Starch, MD;  Location: [place];  Service: Ophthalmology;  Laterality: Bilateral;  . LIPOMA EXCISION  06/1997   left neck (Juengel)  . MOHS SURGERY Right 2013   behind right ear  . TONSILLECTOMY    . VASECTOMY  1982    There were no vitals  filed for this visit.      Subjective Assessment - 03/21/17 0811    Subjective No L knee pain. Feels much better than he was. Feels like he is good to graduate and continue with his HEP.    Pertinent History Bilateral knee pain. Pt states having pain in his L knee currently. Has pain in his R knee at times. Has always had knee problems due to tennis. Pain worsened during the spring of 2018. Has always taken ibuprofen  which helps but takes a higher dose now due to increased pain. Denies locking sensation bilateral knees.    Patient Stated Goals I'd love to get back to playing tennis, improve mobility (being able to go up and down from the floor or chair easier), be able to load and unload his kayak, get into and out of his kayak.    Currently in Pain? No/denies   Pain Score 0-No pain   Pain Onset More than a month ago            [facility] PT Assessment - 03/21/17 0841      Observation/Other Assessments   Lower Extremity Functional Scale  54/80     Strength   Right Hip Extension 4+/5   Right Hip External Rotation  4+/5   Right Hip ABduction 5/5   Left Hip  Extension 5/5   Left Hip External Rotation 4+/5   Left Hip ABduction 4+/5   Right Knee Flexion 5/5   Right Knee Extension 5/5   Left Knee Flexion 5/5   Left Knee Extension 5/5                             PT Education - 03/21/17 0820    Education provided Yes   Education Details ther-ex, HEP   Person(s) Educated Patient   Methods Explanation;Demonstration;Tactile cues;Verbal cues;Handout   Comprehension Verbalized understanding;Returned demonstration        Objectives    There-ex   Seated L knee extension AROM at start of session -15 degrees  Seated self muscle energy technique to promote L knee extension AROM   -12 degrees seated L knee extension AROM afterwards.    Standing hip machine:  Hip extension plate 70 for [id number] LE   Standing hip machine: L hip  abduction plate 76fr [id number] hip abduction plate 40 for [id number]  Walking on balance stones with one UE assist 5x each way  Seated manually resisted hip ER, knee flexion, knee extension, S/L hip abduction, prone hip extension 1x each way for each LE.   Reviewed progress/current status with LE strength with pt.    Standing ankle DF/PF on rocker board x 2 min to promote knee extension      Improved exercise technique, movement at target joints, use of target muscles after min to mod verbal, visual, tactile cues.    Pt demonstrates significant decrease in L knee pain since initial evaluation and without taking his ibuprofen. He also demonstrates improvement in bilateral LE strength and L knee extension ROM, function as well as independence and consistency with his HEP. No complain of R knee pain. Pt has made good progress with PT towards goals. Skilled physical therapy services discharged with pt continuing progress with his exercises at home.             PT Long Term Goals - 03/21/17 06503     PT LONG TERM GOAL #1   Title Patient will have a decrease in L knee pain to 3/10 or less at most to promote ability to walk on uneven ground, perform transfers, participate in activities such as tennis and kayaking.    Baseline 6/10 L knee pain at most for the past month (takes ibuprofen), 02/05/2017; 3/10 L knee pain at most for the past 7 days (03/07/2017); 2/10 at most for the past 7 days (03/19/2017)   Time 2   Period Weeks   Status Achieved     PT LONG TERM GOAL #2   Title Patient will improve bilateral LE strength by at least 1/2 MMT grade to promote ability to perform functional tasks with less bilateral knee pain.    Time 2   Period Weeks   Status Achieved     PT LONG TERM GOAL #3   Title Patient will improve his LEFS score by at least 9 points as a demonstration of improved function.    Baseline 44/80 (02/05/2017); 53/80 (03/07/2017); 54/80 (03/21/2017)   Time 2    Period Weeks   Status Achieved     PT LONG TERM GOAL #4   Title Pt will report being able to play at least 1 game of tennis with 3/10 L knee pain or less to promote ability to participate in recreational activities.    Baseline Unable to play  tennis due to knee pain; 6/10 at most (02/05/2017); Pt has not played tennis yet (03/07/2017); (04/05/17)   Time 6   Period Weeks   Status On-going               Plan - 2017-04-05 1610    Clinical Impression Statement Pt demonstrates significant decrease in L knee pain since initial evaluation and without taking his ibuprofen. He also demonstrates improvement in bilateral LE strength and L knee extension ROM, function as well as independence and consistency with his HEP. No complain of R knee pain. Pt has made good progress with PT towards goals. Skilled physical therapy services discharged with pt continuing progress with his exercises at home.     History and Personal Factors relevant to plan of care: Chronicity of condition   Clinical Presentation Stable   Clinical Presentation due to: Decreased L knee pain, R knee continues to not bother him, improved LE strength since initial evaluation   Clinical Decision Making Low   Rehab Potential Good   Clinical Impairments Affecting Rehab Potential (+) chronicity of condition; (+) motivated, active   PT Frequency --   PT Duration --   PT Treatment/Interventions Therapeutic exercise;Therapeutic activities;Manual techniques;Neuromuscular re-education;Patient/family education   PT Next Visit Plan Continue progress with his HEP.    Consulted and Agree with Plan of Care Patient      Patient will benefit from skilled therapeutic intervention in order to improve the following deficits and impairments:  Pain, Improper body mechanics, Difficulty walking, Decreased strength  Visit Diagnosis: Chronic pain of left knee  Muscle weakness (generalized)  Difficulty in walking, not elsewhere classified  Chronic  pain of right knee       G-Codes - 04-05-17 1436    Functional Assessment Tool Used (Outpatient Only) LEFS, clinical presentation, patient interview   Functional Limitation Mobility: Walking and moving around   Mobility: Walking and Moving Around Goal Status [phone]) At least 1 percent but less than 20 percent impaired, limited or restricted   Mobility: Walking and Moving Around Discharge Status [phone]) At least 20 percent but less than 40 percent impaired, limited or restricted      Problem List Patient Active Problem List   Diagnosis Date Noted  . Poison ivy 11/21/2016  . Gross hematuria 05/01/2016  . Weak urinary stream 05/01/2016  . Prostate cancer screening 05/01/2016  . Hematuria 03/06/2016  . Hx of adenomatous colonic polyps 12/05/2015  . Advance care planning 07/21/2014  . Medicare annual wellness visit, subsequent 07/01/2012  . External hemorrhoids 06/27/2011  . Hypothyroidism 02/24/2008  . HYPERCHOLESTEROLEMIA 02/21/2007  . HYPERTENSION, BENIGN ESSENTIAL 02/21/2007  . DEGENERATIVE JOINT DISEASE, CERVICAL SPINE 02/21/2007    Thank you for your referral.  Joneen Boers PT, DPT   2017/04/05, 2:47 PM  [facility] [address] [place], [address] Phone: [phone]   Fax:  [phone]  Name: James [name] MRN: [id number] Date of Birth: [birth year]

## 2017-03-21 NOTE — Progress Notes (Signed)
To PCP

## 2017-03-21 NOTE — Patient Instructions (Addendum)
Gave seated L knee extension self muscle energy technique to promote L knee extension AROM. Pt demonstrated and verbalized understanding. Handout provided. 3x5 with 5 second holds daily

## 2017-04-25 ENCOUNTER — Other Ambulatory Visit: Payer: Medicare Other

## 2017-04-25 DIAGNOSIS — Z125 Encounter for screening for malignant neoplasm of prostate: Secondary | ICD-10-CM

## 2017-04-25 DIAGNOSIS — R310 Gross hematuria: Secondary | ICD-10-CM | POA: Diagnosis not present

## 2017-04-26 LAB — PSA TOTAL (REFLEX TO FREE): Prostate Specific Ag, Serum: 0.7 ng/mL (ref 0.0–4.0)

## 2017-04-30 ENCOUNTER — Ambulatory Visit: Payer: Medicare Other

## 2017-05-02 ENCOUNTER — Ambulatory Visit: Payer: Medicare Other | Admitting: Urology

## 2017-05-09 ENCOUNTER — Encounter: Payer: Self-pay | Admitting: Urology

## 2017-05-09 ENCOUNTER — Ambulatory Visit: Payer: Medicare Other | Admitting: Urology

## 2017-05-09 VITALS — BP 135/81 | HR 78 | Ht 72.0 in | Wt 268.1 lb

## 2017-05-09 DIAGNOSIS — N400 Enlarged prostate without lower urinary tract symptoms: Secondary | ICD-10-CM

## 2017-05-09 DIAGNOSIS — Z125 Encounter for screening for malignant neoplasm of prostate: Secondary | ICD-10-CM

## 2017-05-09 DIAGNOSIS — R310 Gross hematuria: Secondary | ICD-10-CM | POA: Diagnosis not present

## 2017-05-09 MED ORDER — FINASTERIDE 5 MG PO TABS: 5.0000 mg | ORAL_TABLET | Freq: Every day | ORAL | 3 refills | Status: DC

## 2017-05-09 NOTE — Progress Notes (Signed)
05/09/2017 3:29 PM   James [name] 1946/04/24 [id number]  Referring provider: Tonia Ghent, MD [address] [place], [place] [address]  Chief Complaint  Patient presents with  . Follow-up    HPI: The patient is a 71 year old gentleman who presents today for annual follow-up.  1) Gross hematuria - non-recurrent episide gross hematuria after colonoscopoy that was complicated by re-admission for rectal bleeding. Subsequent UA normal. CT non-con A/P w/o stoens or large upper tract masses. Cysto 04/2016 with large bilobar prostatic hypertrophy only.   2) BPH with LUTS - pt was having nocturia x1. He is on doxazosin for HTN and this was increased from 4 - 6 mg and then back to 4 mg. Prostate vol 8m by imaging. Started finasteride 04/2016. No stiff obstructive urinary symptoms. No current bother.  3) Prostate Screening -  2017 - PSA 2.88, DRE 40gm smooth 2018 - PSA 0.7 (First PSA on finasteride), DRE 20gm benign  Today, [name] seen for the above. His symptoms have improved. He has nocturia x 2 which is chronic.    PMH: Past Medical History:  Diagnosis Date  . Acute lower GI bleeding after colonoscopy and polypectomy, resolved 12/12/2015  . Cataract    bilateral surgery to remove  . Colon polyp   . Constipation   . Hemorrhoids   . Hepatitis    self resolved, likely food exposure, 1974.   .Marland KitchenHx of adenomatous colonic polyps 12/05/2015  . Hyperlipidemia   . Hypertension   . Hypothyroidism   . Skin cancer    basal cell, R ear, MOHS    Surgical History: Past Surgical History:  Procedure Laterality Date  . BROW LIFT Bilateral 10/23/2016   Procedure: BLEPHAROPLASTY upper eyelid with excess skin;  Surgeon: AKarle Starch MD;  [facility]  Service: Ophthalmology;  Laterality: Bilateral;  MAC  . CATARACT EXTRACTION  1986   OD  . CATARACT EXTRACTION Bilateral 1995   x 2 for right and left   . COLONOSCOPY    . ECTROPION REPAIR Bilateral  10/23/2016   Procedure: REPAIR OF ECTROPION sutures, extensive;  Surgeon: AKarle Starch MD;  [facility]  Service: Ophthalmology;  Laterality: Bilateral;  . LIPOMA EXCISION  06/1997   left neck (Juengel)  . MOHS SURGERY Right 2013   behind right ear  . TONSILLECTOMY    . VASECTOMY  1982    Home Medications:  Allergies as of 05/09/2017      Reactions   Sulfonamide Derivatives Rash   REACTION: UNKNOWN AN CHILD      Medication List       Accurate as of 05/09/17  3:29 PM. Always use your most recent med list.          aspirin 81 MG tablet Take 81 mg by mouth daily.   doxazosin 4 MG tablet Commonly known as:  CARDURA Take 1 tablet (4 mg total) by mouth daily.   finasteride 5 MG tablet Commonly known as:  PROSCAR Take 5 mg by mouth daily.   Fish Oil 1000 MG Caps Take by mouth.   levothyroxine 150 MCG tablet Commonly known as:  SYNTHROID, LEVOTHROID TAKE 1 TABLET ONCE DAILY   metoprolol succinate 25 MG 24 hr tablet Commonly known as:  TOPROL XL Take 1 tablet (25 mg total) by mouth daily.   multivitamin tablet Take 1 tablet by mouth daily.   psyllium 58.6 % powder Commonly known as:  METAMUCIL Take 2 teaspoons  with water daily.   ramipril 10 MG capsule Commonly known as:  ALTACE Take 1 capsule (10 mg total) by mouth daily.   simvastatin 40 MG tablet Commonly known as:  ZOCOR Take 1 tablet (40 mg total) by mouth at bedtime.       Allergies:  Allergies  Allergen Reactions  . Sulfonamide Derivatives Rash    REACTION: UNKNOWN AN CHILD    Family History: Family History  Problem Relation Age of Onset  . Heart disease Paternal Grandfather        MI, old age  . Stroke Mother   . Lung cancer Maternal Grandfather        smoker  . Stroke Maternal Grandfather   . Colon cancer Neg Hx   . Colon polyps Neg Hx   . Esophageal cancer Neg Hx   . Rectal cancer Neg Hx   . Stomach cancer Neg Hx   . Bladder Cancer Neg Hx   . Prostate cancer Neg  Hx     Social History:  reports that he quit smoking about 33 years ago. His smoking use included Cigarettes. He has a 40.00 pack-year smoking history. He has never used smokeless tobacco. He reports that he drinks about 4.2 oz of alcohol per week . He reports that he does not use drugs.  ROS: UROLOGY Frequent Urination?: No Hard to postpone urination?: No Burning/pain with urination?: No Get up at night to urinate?: Yes Leakage of urine?: No Urine stream starts and stops?: No Trouble starting stream?: No Do you have to strain to urinate?: No Blood in urine?: No Urinary tract infection?: No Sexually transmitted disease?: No Injury to kidneys or bladder?: No Painful intercourse?: No Weak stream?: No Erection problems?: No Penile pain?: No  Gastrointestinal Nausea?: No Vomiting?: No Indigestion/heartburn?: No Diarrhea?: No Constipation?: No  Constitutional Fever: No Night sweats?: No Weight loss?: No Fatigue?: No  Skin Skin rash/lesions?: No Itching?: No  Eyes Blurred vision?: No Double vision?: No  Ears/Nose/Throat Sore throat?: No Sinus problems?: No  Hematologic/Lymphatic Swollen glands?: No Easy bruising?: No  Cardiovascular Leg swelling?: No Chest pain?: No  Respiratory Cough?: No Shortness of breath?: No  Endocrine Excessive thirst?: No  Musculoskeletal Back pain?: No Joint pain?: No  Neurological Headaches?: No Dizziness?: No  Psychologic Depression?: No Anxiety?: No  Physical Exam: BP 135/81 (BP Location: Right Arm, Patient Position: Sitting, Cuff Size: Normal)   Pulse 78   Ht 6' (1.829 m)   Wt 268 lb 1.6 oz (121.6 kg)   BMI 36.36 kg/m   Constitutional:  Alert and oriented, No acute distress. HEENT: [place] AT, moist mucus membranes.  Trachea midline, no masses. Cardiovascular: No clubbing, cyanosis, or edema. Respiratory: Normal respiratory effort, no increased work of breathing. GI: Abdomen is soft, nontender, nondistended, no  abdominal masses GU: No CVA tenderness. DRE 20 g benign Skin: No rashes, bruises or suspicious lesions. Lymph: No cervical or inguinal adenopathy. Neurologic: Grossly intact, no focal deficits, moving all 4 extremities. Psychiatric: Normal mood and affect.  Laboratory Data: Lab Results  Component Value Date   WBC 11.2 (H) 12/05/2015   HGB 14.0 12/06/2015   HCT 43.6 12/05/2015   MCV 88.0 12/05/2015   PLT 148 (L) 12/05/2015    Lab Results  Component Value Date   CREATININE 1.22 07/16/2016    Lab Results  Component Value Date   PSA 2.88 03/21/2016   PSA 1.23 06/18/2012   PSA 1.45 06/20/2011    No results found for: TESTOSTERONE  No results found for: HGBA1C  Urinalysis    Component Value Date/Time   APPEARANCEUR Clear 05/11/2016 1149   GLUCOSEU Negative 05/11/2016 1149   BILIRUBINUR Negative 05/11/2016 1149   PROTEINUR Negative 05/11/2016 1149   UROBILINOGEN 0.2 03/06/2016 1212   NITRITE Negative 05/11/2016 1149   LEUKOCYTESUR Negative 05/11/2016 1149    Assessment & Plan:    1) Gross hematuria - most likely from BPH given history. No further episdoes  2) BPH with LUTS - continue finasteride and cardura.   3) Prostate Screening -  Up to date thisyear, continue annual screening until age 94 or so as average risk.   RTC 1 year with PSA prior.   Return in about 1 year (around 05/09/2018) for PSA prior.  Nickie Retort, MD  [facility] [address], [place], [place] [address] [phone]

## 2017-07-12 DIAGNOSIS — H11442 Conjunctival cysts, left eye: Secondary | ICD-10-CM | POA: Diagnosis not present

## 2017-07-24 ENCOUNTER — Telehealth: Payer: Self-pay

## 2017-07-24 DIAGNOSIS — E782 Mixed hyperlipidemia: Secondary | ICD-10-CM

## 2017-07-24 DIAGNOSIS — I10 Essential (primary) hypertension: Secondary | ICD-10-CM

## 2017-07-24 NOTE — Telephone Encounter (Signed)
CPE labs ordered. TSH, FLP, CMP  Dr. Damita Dunnings, please review lab orders, make modifications as necessary, and sign note. Thank you.

## 2017-07-25 ENCOUNTER — Ambulatory Visit (INDEPENDENT_AMBULATORY_CARE_PROVIDER_SITE_OTHER): Payer: Medicare Other

## 2017-07-25 VITALS — BP 124/76 | HR 89 | Temp 98.1°F | Ht 72.0 in | Wt 268.5 lb

## 2017-07-25 DIAGNOSIS — E782 Mixed hyperlipidemia: Secondary | ICD-10-CM | POA: Diagnosis not present

## 2017-07-25 DIAGNOSIS — Z0000 Encounter for general adult medical examination without abnormal findings: Secondary | ICD-10-CM

## 2017-07-25 DIAGNOSIS — I10 Essential (primary) hypertension: Secondary | ICD-10-CM

## 2017-07-25 LAB — LIPID PANEL
CHOLESTEROL: 140 mg/dL (ref 0–200)
HDL: 40.5 mg/dL (ref >39.00)
LDL CALC: 73 mg/dL (ref 0–99)
NonHDL: 99.31
TRIGLYCERIDES: 132 mg/dL (ref 0.0–149.0)
Total CHOL/HDL Ratio: 3
VLDL: 26.4 mg/dL (ref 0.0–40.0)

## 2017-07-25 LAB — COMPREHENSIVE METABOLIC PANEL
ALBUMIN: 3.8 g/dL (ref 3.5–5.2)
ALT: 7 U/L (ref 0–53)
AST: 9 U/L (ref 0–37)
Alkaline Phosphatase: 54 U/L (ref 39–117)
BUN: 18 mg/dL (ref 6–23)
CALCIUM: 9.3 mg/dL (ref 8.4–10.5)
CHLORIDE: 107 meq/L (ref 96–112)
CO2: 29 mEq/L (ref 19–32)
Creatinine, Ser: 1.22 mg/dL (ref 0.40–1.50)
GFR: 62.42 mL/min (ref >60.00)
Glucose, Bld: 81 mg/dL (ref 70–99)
POTASSIUM: 4.6 meq/L (ref 3.5–5.1)
Sodium: 140 mEq/L (ref 135–145)
Total Bilirubin: 1.2 mg/dL (ref 0.2–1.2)
Total Protein: 6.8 g/dL (ref 6.0–8.3)

## 2017-07-25 LAB — TSH: TSH: 1.62 u[IU]/mL (ref 0.35–4.50)

## 2017-07-25 NOTE — Patient Instructions (Signed)
James [name] , Thank you for taking time to come for your Medicare Wellness Visit. I appreciate your ongoing commitment to your health goals. Please review the following plan we discussed and let me know if I can assist you in the future.   These are the goals we discussed: Goals    . Increase physical activity     Spring 2019, I will attempt to walk for at least 60 minutes 3 days per week.        This is a list of the screening recommended for you and due dates:  Health Maintenance  Topic Date Due  . Tetanus Vaccine  02/18/2018  . Colon Cancer Screening  08/22/2019  . Flu Shot  Completed  .  Hepatitis C: One time screening is recommended by Center for Disease Control  (CDC) for  adults born from [birth year] through 1964.   Completed  . Pneumonia vaccines  Completed   Preventive Care for Adults  A healthy lifestyle and preventive care can promote health and wellness. Preventive health guidelines for adults include the following key practices.  . A routine yearly physical is a good way to check with your health care provider about your health and preventive screening. It is a chance to share any concerns and updates on your health and to receive a thorough exam.  . Visit your dentist for a routine exam and preventive care every 6 months. Brush your teeth twice a day and floss once a day. Good oral hygiene prevents tooth decay and gum disease.  . The frequency of eye exams is based on your age, health, family medical history, use  of contact lenses, and other factors. Follow your health care provider's recommendations for frequency of eye exams.  . Eat a healthy diet. Foods like vegetables, fruits, whole grains, low-fat dairy products, and lean protein foods contain the nutrients you need without too many calories. Decrease your intake of foods high in solid fats, added sugars, and salt. Eat the right amount of calories for you. Get information about a proper diet from your health care provider,  if necessary.  . Regular physical exercise is one of the most important things you can do for your health. Most adults should get at least 150 minutes of moderate-intensity exercise (any activity that increases your heart rate and causes you to sweat) each week. In addition, most adults need muscle-strengthening exercises on 2 or more days a week.  Silver Sneakers may be a benefit available to you. To determine eligibility, you may visit the website: www.silversneakers.com or contact program at 902-883-8765 Mon-Fri between 8AM-8PM.   . Maintain a healthy weight. The body mass index (BMI) is a screening tool to identify possible weight problems. It provides an estimate of body fat based on height and weight. Your health care provider can find your BMI and can help you achieve or maintain a healthy weight.   For adults 20 years and older: ? A BMI below 18.5 is considered underweight. ? A BMI of 18.5 to 24.9 is normal. ? A BMI of 25 to 29.9 is considered overweight. ? A BMI of 30 and above is considered obese.   . Maintain normal blood lipids and cholesterol levels by exercising and minimizing your intake of saturated fat. Eat a balanced diet with plenty of fruit and vegetables. Blood tests for lipids and cholesterol should begin at age 90 and be repeated every 5 years. If your lipid or cholesterol levels are high, you are over 50,  or you are at high risk for heart disease, you may need your cholesterol levels checked more frequently. Ongoing high lipid and cholesterol levels should be treated with medicines if diet and exercise are not working.  . If you smoke, find out from your health care provider how to quit. If you do not use tobacco, please do not start.  . If you choose to drink alcohol, please do not consume more than 2 drinks per day. One drink is considered to be 12 ounces (355 mL) of beer, 5 ounces (148 mL) of wine, or 1.5 ounces (44 mL) of liquor.  . If you are 58-65 years old, ask  your health care provider if you should take aspirin to prevent strokes.  . Use sunscreen. Apply sunscreen liberally and repeatedly throughout the day. You should seek shade when your shadow is shorter than you. Protect yourself by wearing long sleeves, pants, a wide-brimmed hat, and sunglasses year round, whenever you are outdoors.  . Once a month, do a whole body skin exam, using a mirror to look at the skin on your back. Tell your health care provider of new moles, moles that have irregular borders, moles that are larger than a pencil eraser, or moles that have changed in shape or color.

## 2017-07-25 NOTE — Progress Notes (Signed)
Subjective:   James [name] is a 71 y.o. male who presents for Medicare Annual/Subsequent preventive examination.  Review of Systems:  N/A Cardiac Risk Factors include: advanced age (>78mn, >>50women);male gender;obesity (BMI >30kg/m2);dyslipidemia;hypertension     Objective:    Vitals: BP 124/76 (BP Location: Right Arm, Patient Position: Sitting, Cuff Size: Normal)   Pulse 89   Temp 98.1 F (36.7 C) (Oral)   Ht 6' (1.829 m) Comment: no shoes  Wt 268 lb 8 oz (121.8 kg)   SpO2 98%   BMI 36.42 kg/m   Body mass index is 36.42 kg/m.  Advanced Directives 07/25/2017 02/05/2017 10/23/2016 08/09/2016 12/06/2015 12/05/2015 11/23/2015  Does Patient Have a Medical Advance Directive? _0  Yes Yes  Type of AParamedicof AFair OaksLiving will HWilton ManorsLiving will HEdieLiving will HSpringerLiving will Living will Living will HBooker Does patient want to make changes to medical advance directive? - No - Patient declined - - No - Patient declined - -  Copy of HWarrenin Chart? No - copy requested - No - copy requested - - - -    Tobacco Social History   Tobacco Use  Smoking Status Former Smoker  . Packs/day: 2.00  . Years: 20.00  . Pack years: 40.00  . Types: Cigarettes  . Last attempt to quit: 08/07/1983  . Years since quitting: 33.9  Smokeless Tobacco Never Used  Tobacco Comment   quit over 30 years ago     Counseling given: No Comment: quit over 30 years ago   Clinical Intake:  Pre-visit preparation completed: Yes  Pain Score: 2 (left knee pain)     Nutritional Status: BMI > 30  Obese Nutritional Risks: None Diabetes: No  How often do you need to have someone help you when you read instructions, pamphlets, or other written materials from your doctor or pharmacy?: 1 - Never What is the last grade level you completed in school?:  Technical degree  Interpreter Needed?: No  Comments: pt lives with spouse Information entered by :: LPinson, LPN  Past Medical History:  Diagnosis Date  . Acute lower GI bleeding after colonoscopy and polypectomy, resolved 12/12/2015  . Cataract    bilateral surgery to remove  . Colon polyp   . Constipation   . Hemorrhoids   . Hepatitis    self resolved, likely food exposure, 1974.   .Marland KitchenHx of adenomatous colonic polyps 12/05/2015  . Hyperlipidemia   . Hypertension   . Hypothyroidism   . Skin cancer    basal cell, R ear, MOHS   Past Surgical History:  Procedure Laterality Date  . BROW LIFT Bilateral 10/23/2016   Procedure: BLEPHAROPLASTY upper eyelid with excess skin;  Surgeon: AKarle Starch MD;  [place] [facility]  Service: Ophthalmology;  Laterality: Bilateral;  MAC  . CATARACT EXTRACTION  1986   OD  . CATARACT EXTRACTION Bilateral 1995   x 2 for right and left   . COLONOSCOPY    . ECTROPION REPAIR Bilateral 10/23/2016   Procedure: REPAIR OF ECTROPION sutures, extensive;  Surgeon: AKarle Starch MD;  [facility]  Service: Ophthalmology;  Laterality: Bilateral;  . LIPOMA EXCISION  06/1997   left neck (Juengel)  . MOHS SURGERY Right 2013   behind right ear  . TONSILLECTOMY    . VASECTOMY  1982   Family History  Problem Relation Age  of Onset  . Heart disease Paternal Grandfather        MI, old age  . Stroke Mother   . Lung cancer Maternal Grandfather        smoker  . Stroke Maternal Grandfather   . Colon cancer Neg Hx   . Colon polyps Neg Hx   . Esophageal cancer Neg Hx   . Rectal cancer Neg Hx   . Stomach cancer Neg Hx   . Bladder Cancer Neg Hx   . Prostate cancer Neg Hx    Social History   Socioeconomic History  . Marital status: Married    Spouse name: Remo Lipps  . Number of children: 1  . Years of education: 74  . Highest education level: Associate degree: occupational, Hotel manager, or vocational program  Social Needs  . Financial  resource strain: None  . Food insecurity - worry: None  . Food insecurity - inability: None  . Transportation needs - medical: None  . Transportation needs - non-medical: None  Occupational History  . Occupation: Retired    Fish farm manager: RETIRED    Comment: 1  Tobacco Use  . Smoking status: Former Smoker    Packs/day: 2.00    Years: 20.00    Pack years: 40.00    Types: Cigarettes    Last attempt to quit: 08/07/1983    Years since quitting: 33.9  . Smokeless tobacco: Never Used  . Tobacco comment: quit over 30 years ago  Substance and Sexual Activity  . Alcohol use: Yes    Alcohol/week: 4.2 oz    Types: 7 Shots of liquor per week    Comment: a drink every evening, scotch or whiskey  . Drug use: No  . Sexual activity: None  Other Topics Concern  . None  Social History Narrative   Married and lives with wife 1974   One daughter, not local   Retired from [employer], sea based telecommunication/contracting    Outpatient Encounter Medications as of 07/25/2017  Medication Sig  . aspirin 81 MG tablet Take 81 mg by mouth daily.  Marland Kitchen doxazosin (CARDURA) 4 MG tablet Take 1 tablet (4 mg total) by mouth daily.  . finasteride (PROSCAR) 5 MG tablet Take 1 tablet (5 mg total) by mouth daily.  Marland Kitchen levothyroxine (SYNTHROID, LEVOTHROID) 150 MCG tablet TAKE 1 TABLET ONCE DAILY  . metoprolol succinate (TOPROL XL) 25 MG 24 hr tablet Take 1 tablet (25 mg total) by mouth daily.  . Multiple Vitamin (MULTIVITAMIN) tablet Take 1 tablet by mouth daily.    . Omega-3 Fatty Acids (FISH OIL) 1000 MG CAPS Take by mouth.  . psyllium (METAMUCIL) 58.6 % powder Take 2 teaspoons with water daily.  . ramipril (ALTACE) 10 MG capsule Take 1 capsule (10 mg total) by mouth daily.  . simvastatin (ZOCOR) 40 MG tablet Take 1 tablet (40 mg total) by mouth at bedtime.  . [DISCONTINUED] finasteride (PROSCAR) 5 MG tablet Take 5 mg by mouth daily.   No facility-administered encounter medications on file as of 07/25/2017.      Activities of Daily Living In your present state of health, do you have any difficulty performing the following activities: 07/25/2017 10/23/2016  Hearing? N N  Vision? N N  Difficulty concentrating or making decisions? N N  Walking or climbing stairs? Y N  Comment SOB and knee pain -  Dressing or bathing? N N  Doing errands, shopping? N -  Preparing Food and eating ? N -  Using the Toilet? N -  In the past six months, have you accidently leaked urine? N -  Do you have problems with loss of bowel control? N -  Managing your Medications? N -  Managing your Finances? N -  Housekeeping or managing your Housekeeping? N -  Some recent data might be hidden    Patient Care Team: Tonia Ghent, MD as PCP - General (Family Medicine)   Assessment:   This is a routine wellness examination for [facility].   Hearing Screening   _0  _1  _2  _3  _4  _5  _6  _7  _8   Right ear:   40 40 40  40    Left ear:   40 40 40  0    Vision Screening Comments: Last vision exam in December 2018 @ Dr. Edison Pace    Exercise Activities and Dietary recommendations Current Exercise Habits: The patient does not participate in regular exercise at present, Exercise limited by: orthopedic condition(s)  Goals    . Increase physical activity     Spring 2019, I will attempt to walk for at least 60 minutes 3 days per week.        Fall Risk Fall Risk  07/25/2017 07/23/2016 07/22/2015 07/20/2014 07/16/2013  Falls in the past year? No No No No Yes  Number falls in past yr: - - - - 1   Depression Screen PHQ 2/9 Scores 07/25/2017 07/23/2016 07/22/2015 07/20/2014  PHQ - 2 Score 0 0 0 0  PHQ- 9 Score 0 - - -    Cognitive Function MMSE - Mini Mental State Exam 07/25/2017  Orientation to time 5  Orientation to Place 5  Registration 3  Attention/ Calculation 0  Recall 3  Language- name 2 objects 0  Language- repeat 1  Language- follow 3 step command 3  Language- read & follow  direction 0  Write a sentence 0  Copy design 0  Total score 20     PLEASE NOTE: A Mini-Cog screen was completed. Maximum score is 20. A value of 0 denotes this part of [place] MMSE was not completed or the patient failed this part of the Mini-Cog screening.   Mini-Cog Screening Orientation to Time - Max 5 pts Orientation to Place - Max 5 pts Registration - Max 3 pts Recall - Max 3 pts Language Repeat - Max 1 pts Language Follow 3 Step Command - Max 3 pts     Immunization History  Administered Date(s) Administered  . Influenza, High Dose Seasonal PF 04/14/2017  . Influenza-Unspecified 05/06/2013, 04/20/2014, 05/07/2015, 04/06/2016  . Pneumococcal Conjugate-13 07/20/2014  . Pneumococcal Polysaccharide-23 07/16/2013  . Td 02/19/2008  . Zoster 09/30/2007    Screening Tests Health Maintenance  Topic Date Due  . TETANUS/TDAP  02/18/2018  . COLONOSCOPY  08/22/2019  . INFLUENZA VACCINE  Completed  . Hepatitis C Screening  Completed  . PNA vac Low Risk Adult  Completed     Plan:     I have personally reviewed, addressed, and noted the following in the patient's chart:  A. Medical and social history B. Use of alcohol, tobacco or illicit drugs  C. Current medications and supplements D. Functional ability and status E.  Nutritional status F.  Physical activity G. Advance directives H. List of other physicians I.  Hospitalizations, surgeries, and ER visits in previous 12 months [facility] to include hearing, vision, cognitive, depression L. Referrals and appointments - none  In addition, I have reviewed and discussed with patient certain preventive protocols, quality metrics, and best practice  recommendations. A written personalized care plan for preventive services as well as general preventive health recommendations were provided to patient.  See attached scanned questionnaire for additional information.   Signed,   Lindell Noe, MHA, BS, LPN [facility]

## 2017-07-25 NOTE — Progress Notes (Signed)
PCP notes:   Health maintenance:  No gaps identified.  Abnormal screenings:   Hearing - failed  Hearing Screening   125Hz  250Hz  500Hz  1000Hz  2000Hz  3000Hz  4000Hz  6000Hz  8000Hz   Right ear:   40 40 40  40    Left ear:   40 40 40  0      Patient concerns:   Chronic left knee pain - Pain scale: 2/10. Takes OTC Ibuprofen and uses massage as relief measures.   Nurse concerns:  None  Next PCP appt:   08/05/17 @ 0845 I reviewed health advisor's note, was available for consultation on the day of service listed in this note, and agree with documentation and plan. Elsie Stain, MD.

## 2017-07-25 NOTE — Progress Notes (Signed)
Pre visit review using our clinic review tool, if applicable. No additional management support is needed unless otherwise documented below in the visit note. 

## 2017-08-05 ENCOUNTER — Encounter: Payer: Self-pay | Admitting: Family Medicine

## 2017-08-05 ENCOUNTER — Ambulatory Visit (INDEPENDENT_AMBULATORY_CARE_PROVIDER_SITE_OTHER): Payer: Medicare Other | Admitting: Family Medicine

## 2017-08-05 VITALS — BP 122/88 | HR 88 | Temp 97.7°F | Ht 72.0 in | Wt 272.2 lb

## 2017-08-05 DIAGNOSIS — Z0000 Encounter for general adult medical examination without abnormal findings: Secondary | ICD-10-CM

## 2017-08-05 DIAGNOSIS — E7800 Pure hypercholesterolemia, unspecified: Secondary | ICD-10-CM

## 2017-08-05 DIAGNOSIS — M25569 Pain in unspecified knee: Secondary | ICD-10-CM | POA: Diagnosis not present

## 2017-08-05 DIAGNOSIS — E039 Hypothyroidism, unspecified: Secondary | ICD-10-CM

## 2017-08-05 DIAGNOSIS — I10 Essential (primary) hypertension: Secondary | ICD-10-CM | POA: Diagnosis not present

## 2017-08-05 MED ORDER — METOPROLOL SUCCINATE ER 25 MG PO TB24: 25.0000 mg | ORAL_TABLET | Freq: Every day | ORAL | 3 refills | Status: DC

## 2017-08-05 MED ORDER — SIMVASTATIN 40 MG PO TABS: 40.0000 mg | ORAL_TABLET | Freq: Every day | ORAL | 3 refills | Status: DC

## 2017-08-05 MED ORDER — LEVOTHYROXINE SODIUM 150 MCG PO TABS: ORAL_TABLET | ORAL | 3 refills | Status: DC

## 2017-08-05 MED ORDER — DICLOFENAC SODIUM 1 % TD GEL: 4.0000 g | Freq: Four times a day (QID) | TRANSDERMAL | 3 refills | Status: DC | PRN

## 2017-08-05 MED ORDER — RAMIPRIL 10 MG PO CAPS: 10.0000 mg | ORAL_CAPSULE | Freq: Every day | ORAL | 3 refills | Status: DC

## 2017-08-05 MED ORDER — DOXAZOSIN MESYLATE 4 MG PO TABS: 4.0000 mg | ORAL_TABLET | Freq: Every day | ORAL | 3 refills | Status: DC

## 2017-08-05 NOTE — Patient Instructions (Addendum)
Check with your insurance to see if they will cover the shingrix shot. Use diclofenac gel and limit oral ibuprofen.  Get back to exercising.  Update me as needed.  Take care.  Glad to see you.

## 2017-08-05 NOTE — Progress Notes (Signed)
Hearing - failed.  Declined hearing aids.   Shingles d/w pt.  See AVS.   PSA per urology.  I'll defer for now.  Consider recheck PSA next year.  D/w pt about PSAs in general.  We can recheck here next year if not seen at urology.   Colonoscopy up to date, due 2021.  Wife would be designated if incapacitated.  Diet and exercise d/w pt.  He was doing well until his knee started to bother him.  He had to quit tennis.    Chronic left knee pain - Pain scale: 2/10. Takes OTC Ibuprofen and uses massage as relief measures.  he is trying to limit ibuprofen.  Diclofenac gel helped a lot.  D/w pt.  No joint locking but just sore.    LUTS controlled with current meds.  See above re: PSA.    Hypothyroidism.  No neck mass, compliant.  No dysphagia.  Labs d/w pt.   Elevated Cholesterol: Using medications without problems:yes Muscle aches: not from statin Diet compliance: encouraged, "fair" Exercise: see above.   Hypertension:    Using medication without problems or lightheadedness: yes Chest pain with exertion:no Edema:no Short of breath: some with exertion, he thought due to relative deconditioning.    Meds, vitals, and allergies reviewed.   PMH and SH reviewed  ROS: Per HPI unless specifically indicated in ROS section   GEN: nad, alert and oriented HEENT: mucous membranes moist NECK: supple w/o LA CV: rrr. PULM: ctab, no inc wob ABD: soft, +bs EXT: no edema SKIN: no acute rash Left knee puffy with crepitus on range of motion.  Able to bear weight.  No redness.  No bruising.

## 2017-08-07 DIAGNOSIS — M25569 Pain in unspecified knee: Secondary | ICD-10-CM | POA: Insufficient documentation

## 2017-08-07 DIAGNOSIS — M25462 Effusion, left knee: Secondary | ICD-10-CM | POA: Insufficient documentation

## 2017-08-07 NOTE — Assessment & Plan Note (Signed)
No change in meds.  Continue work on diet and exercise.  He agrees.  Labs discussed with patient.

## 2017-08-07 NOTE — Assessment & Plan Note (Signed)
No neck mass, compliant.  No dysphagia.  Labs d/w pt.  Continue as is.  He agrees.

## 2017-08-07 NOTE — Assessment & Plan Note (Signed)
Okay to use diclofenac gel as needed and update me as needed.  Try to limit systemic NSAID use.  He agrees.  Routine cautions given.

## 2017-08-07 NOTE — Assessment & Plan Note (Signed)
Hearing - failed.  Declined hearing aids.   Shingles d/w pt.  See AVS.   PSA per urology.  I'll defer for now.  Consider recheck PSA next year.  D/w pt about PSAs in general.  We can recheck here next year if not seen at urology.   Colonoscopy up to date, due 2021.  Wife would be designated if incapacitated.  Diet and exercise d/w pt.  He was doing well until his knee started to bother him.  He had to quit tennis.

## 2017-08-13 ENCOUNTER — Telehealth: Payer: Self-pay | Admitting: *Deleted

## 2017-08-13 NOTE — Telephone Encounter (Signed)
Copied from [place] [id number]. Topic: General - Other >> Aug 13, 2017 11:21 AM Cecelia Byars, NT wrote: Reason for CRM: [facility] called and need additional clinical information for diclofenac sodium  please call  [phone] opt. 5

## 2017-08-13 NOTE — Telephone Encounter (Signed)
Spoke with Lorriane Shire at [facility].  PA will be submitted to their pharmacist for a determination.

## 2017-09-24 DIAGNOSIS — Z85820 Personal history of malignant melanoma of skin: Secondary | ICD-10-CM | POA: Diagnosis not present

## 2017-09-24 DIAGNOSIS — Z08 Encounter for follow-up examination after completed treatment for malignant neoplasm: Secondary | ICD-10-CM | POA: Diagnosis not present

## 2017-09-24 DIAGNOSIS — L821 Other seborrheic keratosis: Secondary | ICD-10-CM | POA: Diagnosis not present

## 2017-09-24 DIAGNOSIS — Z85828 Personal history of other malignant neoplasm of skin: Secondary | ICD-10-CM | POA: Diagnosis not present

## 2017-11-18 DIAGNOSIS — H26499 Other secondary cataract, unspecified eye: Secondary | ICD-10-CM | POA: Diagnosis not present

## 2017-12-23 ENCOUNTER — Ambulatory Visit (INDEPENDENT_AMBULATORY_CARE_PROVIDER_SITE_OTHER): Payer: Medicare Other | Admitting: Orthopedic Surgery

## 2017-12-23 ENCOUNTER — Encounter (INDEPENDENT_AMBULATORY_CARE_PROVIDER_SITE_OTHER): Payer: Self-pay | Admitting: Orthopedic Surgery

## 2017-12-23 DIAGNOSIS — M1712 Unilateral primary osteoarthritis, left knee: Secondary | ICD-10-CM | POA: Diagnosis not present

## 2017-12-23 DIAGNOSIS — R0989 Other specified symptoms and signs involving the circulatory and respiratory systems: Secondary | ICD-10-CM | POA: Diagnosis not present

## 2017-12-24 ENCOUNTER — Telehealth (INDEPENDENT_AMBULATORY_CARE_PROVIDER_SITE_OTHER): Payer: Self-pay | Admitting: *Deleted

## 2017-12-24 NOTE — Telephone Encounter (Signed)
Pt is scheduled for [place] ABI on Thurs May 23 at 2:00pm at [facility]. IC LVM for pt to return call.

## 2017-12-25 NOTE — Telephone Encounter (Signed)
PT AWARE OF APPT VIA MYCHART PER PT

## 2017-12-26 ENCOUNTER — Ambulatory Visit (HOSPITAL_COMMUNITY)
Admission: RE | Admit: 2017-12-26 | Discharge: 2017-12-26 | Disposition: A | Discharge status: Home / Self Care | Payer: Medicare Other | Source: Ambulatory Visit | Attending: Orthopedic Surgery | Admitting: Orthopedic Surgery

## 2017-12-26 DIAGNOSIS — R0989 Other specified symptoms and signs involving the circulatory and respiratory systems: Secondary | ICD-10-CM | POA: Diagnosis not present

## 2017-12-26 NOTE — Progress Notes (Signed)
VASCULAR LAB PRELIMINARY  ARTERIAL  ABI completed:    RIGHT    LEFT    PRESSURE WAVEFORM  PRESSURE WAVEFORM  BRACHIAL 147 T BRACHIAL 144 T  DP 151 T DP 151 T  AT   AT    PT 175 T PT 181 T  PER   PER    GREAT TOE  NA GREAT TOE  NA    RIGHT LEFT  ABI 1.19 1.23     Sahej Hauswirth, RVT 12/26/2017, 1:56 PM

## 2017-12-28 DIAGNOSIS — R0989 Other specified symptoms and signs involving the circulatory and respiratory systems: Secondary | ICD-10-CM | POA: Diagnosis not present

## 2017-12-28 DIAGNOSIS — M1712 Unilateral primary osteoarthritis, left knee: Secondary | ICD-10-CM

## 2017-12-28 MED ORDER — METHYLPREDNISOLONE ACETATE 40 MG/ML IJ SUSP
40.0000 mg | INTRAMUSCULAR | Status: AC | PRN
Start: 2017-12-28 — End: 2017-12-28
  Administered 2017-12-28: 40 mg via INTRA_ARTICULAR

## 2017-12-28 MED ORDER — BUPIVACAINE HCL 0.25 % IJ SOLN
4.0000 mL | INTRAMUSCULAR | Status: AC | PRN
  Administered 2017-12-28: 4 mL via INTRA_ARTICULAR

## 2017-12-28 MED ORDER — LIDOCAINE HCL 1 % IJ SOLN
5.0000 mL | INTRAMUSCULAR | Status: AC | PRN
  Administered 2017-12-28: 5 mL

## 2017-12-28 NOTE — Progress Notes (Signed)
Office Visit Note   Patient: [name]           Date of Birth: 02/07/1946           MRN: [id number] Visit Date: 12/23/2017 Requested by: James Ghent, MD [place], [place] [address] PCP: James Ghent, MD  Subjective: Chief Complaint  Patient presents with  . Left Knee - Pain    HPI: [name] is a patient with left knee pain of one year duration.  Been going on for longer but is been severe over the past year.  Radiographs from June 2018 show tricompartmental arthritis.  He states that the left knee is not really getting any better.  He describes decreased walking endurance.  Does not wake him from sleep.  Hard for him to walk on uneven ground.  Patient also has known history of mild right knee arthritis.  He has had no prior surgery in the left knee.  Describes global pain in that knee.  His wife is at home and they live in a retirement community which is entirely flat.  Dental he is okay with recent checkup and no pending surgeries.              ROS: All systems reviewed are negative as they relate to the chief complaint within the history of present illness.  Patient denies  fevers or chills.   Assessment & Plan: Visit Diagnoses:  1. Decreased pulses in feet     Plan: Impression is bilateral knee arthritis left worse than right with symptoms which may warrant total knee replacement in the future.  Pulses diminished today and ABIs were obtained.  They were normal.  Injection performed today and we will see him back in 8 weeks.  May decide then for or against further surgical intervention depending on his result with that injection.  He does take ibuprofen and diclofenac gel.  Follow-Up Instructions: Return in about 8 weeks (around 02/17/2018).   Orders:  No orders of the defined types were placed in this encounter.  No orders of the defined types were placed in this encounter.     Procedures: Large Joint Inj: L knee on 12/28/2017 2:42  PM Indications: diagnostic evaluation, joint swelling and pain Details: 18 G 1.5 in needle, superolateral approach  Arthrogram: No  Medications: 5 mL lidocaine 1 %; 40 mg methylPREDNISolone acetate 40 MG/ML; 4 mL bupivacaine 0.25 % Outcome: tolerated well, no immediate complications Procedure, treatment alternatives, risks and benefits explained, specific risks discussed. Consent was given by the patient. Immediately prior to procedure a time out was called to verify the correct patient, procedure, equipment, support staff and site/side marked as required. Patient was prepped and draped in the usual sterile fashion.       Clinical Data: No additional findings.  Objective: Vital Signs: There were no vitals taken for this visit.  Physical Exam:   Constitutional: Patient appears well-developed HEENT:  Head: Normocephalic Eyes:EOM are normal Neck: Normal range of motion Cardiovascular: Normal rate Pulmonary/chest: Effort normal Neurologic: Patient is alert Skin: Skin is warm Psychiatric: Patient has normal mood and affect    Ortho Exam: Ortho exam demonstrates mildly antalgic gait to the left.  Flexion contracture noted on the left of about 10 degrees with none on the right.  Medial and lateral joint line tenderness is present with bilateral patellofemoral crepitus.  Extensor mechanism is intact.  No groin pain with internal/external rotation of the leg.  No other masses  lymph adenopathy or skin changes noted in that left knee region.  Range of motion is past 90.  Specialty Comments:  No specialty comments available.  Imaging: No results found.   PMFS History: Patient Active Problem List   Diagnosis Date Noted  . Knee pain 08/07/2017  . Poison ivy 11/21/2016  . Gross hematuria 05/01/2016  . Weak urinary stream 05/01/2016  . Healthcare maintenance 05/01/2016  . Hematuria 03/06/2016  . Hx of adenomatous colonic polyps 12/05/2015  . Advance care planning 07/21/2014  .  Medicare annual wellness visit, subsequent 07/01/2012  . External hemorrhoids 06/27/2011  . Hypothyroidism 02/24/2008  . HYPERCHOLESTEROLEMIA 02/21/2007  . HYPERTENSION, BENIGN ESSENTIAL 02/21/2007  . DEGENERATIVE JOINT DISEASE, CERVICAL SPINE 02/21/2007   Past Medical History:  Diagnosis Date  . Acute lower GI bleeding after colonoscopy and polypectomy, resolved 12/12/2015  . Cataract    bilateral surgery to remove  . Colon polyp   . Constipation   . Hemorrhoids   . Hepatitis    self resolved, likely food exposure, 1974.   Marland Kitchen Hx of adenomatous colonic polyps 12/05/2015  . Hyperlipidemia   . Hypertension   . Hypothyroidism   . Skin cancer    basal cell, R ear, MOHS    Family History  Problem Relation Age of Onset  . Heart disease Paternal Grandfather        MI, old age  . Stroke Mother   . Lung cancer Maternal Grandfather        smoker  . Stroke Maternal Grandfather   . Colon cancer Neg Hx   . Colon polyps Neg Hx   . Esophageal cancer Neg Hx   . Rectal cancer Neg Hx   . Stomach cancer Neg Hx   . Bladder Cancer Neg Hx   . Prostate cancer Neg Hx     Past Surgical History:  Procedure Laterality Date  . BROW LIFT Bilateral 10/23/2016   Procedure: BLEPHAROPLASTY upper eyelid with excess skin;  Surgeon: James Starch, MD;  Location: [name];  Service: Ophthalmology;  Laterality: Bilateral;  MAC  . CATARACT EXTRACTION  1986   OD  . CATARACT EXTRACTION Bilateral 1995   x 2 for right and left   . COLONOSCOPY    . ECTROPION REPAIR Bilateral 10/23/2016   Procedure: REPAIR OF ECTROPION sutures, extensive;  Surgeon: James Starch, MD;  Location: [name];  Service: Ophthalmology;  Laterality: Bilateral;  . LIPOMA EXCISION  06/1997   left neck ([name])  . MOHS SURGERY Right 2013   behind right ear  . TONSILLECTOMY    . VASECTOMY  1982   Social History   Occupational History  . Occupation: Retired    Fish farm manager: RETIRED    Comment: 1  Tobacco Use  .  Smoking status: Former Smoker    Packs/day: 2.00    Years: 20.00    Pack years: 40.00    Types: Cigarettes    Last attempt to quit: 08/07/1983    Years since quitting: 34.4  . Smokeless tobacco: Never Used  . Tobacco comment: quit over 30 years ago  Substance and Sexual Activity  . Alcohol use: Yes    Alcohol/week: 4.2 oz    Types: 7 Shots of liquor per week    Comment: a drink every evening, scotch or whiskey  . Drug use: No  . Sexual activity: Not on file

## 2018-01-17 ENCOUNTER — Telehealth (INDEPENDENT_AMBULATORY_CARE_PROVIDER_SITE_OTHER): Payer: Self-pay | Admitting: Orthopedic Surgery

## 2018-01-17 NOTE — Telephone Encounter (Signed)
Please complete sheet. Thanks.

## 2018-01-17 NOTE — Telephone Encounter (Signed)
Patient would like to proceed with scheduling LEFT TOTAL KNEE REPLACEMENT with Dr. Marlou Sa.  May I have a surgery sheet?

## 2018-01-27 NOTE — Telephone Encounter (Signed)
Patient calling in reference to surgery date.  Can I get a surgery sheet?  Thanks

## 2018-01-28 NOTE — Telephone Encounter (Signed)
Please complete sheet for Debbie. Thanks.

## 2018-01-28 NOTE — Telephone Encounter (Addendum)
Patient was not aware that he was to return in 8 weeks following his injection on 12/23/17.  No follow up visit was scheduled. He states the injection provided him with some relief, but not enough that he could live with.  He will come in for a visit prior to the surgery if you suggest, but overall he feels that he should proceed with scheduling the total knee replacement.

## 2018-01-29 NOTE — Telephone Encounter (Signed)
Ok for tka pls claal thx

## 2018-01-31 ENCOUNTER — Other Ambulatory Visit (INDEPENDENT_AMBULATORY_CARE_PROVIDER_SITE_OTHER): Payer: Self-pay | Admitting: Orthopedic Surgery

## 2018-01-31 DIAGNOSIS — M1712 Unilateral primary osteoarthritis, left knee: Secondary | ICD-10-CM

## 2018-02-10 ENCOUNTER — Other Ambulatory Visit (INDEPENDENT_AMBULATORY_CARE_PROVIDER_SITE_OTHER): Payer: Self-pay | Admitting: Orthopedic Surgery

## 2018-02-11 ENCOUNTER — Encounter: Payer: Self-pay | Admitting: Family Medicine

## 2018-02-11 ENCOUNTER — Ambulatory Visit: Payer: Medicare Other | Admitting: Family Medicine

## 2018-02-11 VITALS — BP 142/84 | HR 94 | Temp 98.2°F | Ht 72.0 in | Wt 266.5 lb

## 2018-02-11 DIAGNOSIS — Z01818 Encounter for other preprocedural examination: Secondary | ICD-10-CM | POA: Diagnosis not present

## 2018-02-11 DIAGNOSIS — R0989 Other specified symptoms and signs involving the circulatory and respiratory systems: Secondary | ICD-10-CM | POA: Diagnosis not present

## 2018-02-11 DIAGNOSIS — R011 Cardiac murmur, unspecified: Secondary | ICD-10-CM

## 2018-02-11 DIAGNOSIS — I4891 Unspecified atrial fibrillation: Secondary | ICD-10-CM | POA: Diagnosis not present

## 2018-02-11 LAB — COMPREHENSIVE METABOLIC PANEL
ALK PHOS: 51 U/L (ref 39–117)
ALT: 8 U/L (ref 0–53)
AST: 13 U/L (ref 0–37)
Albumin: 3.9 g/dL (ref 3.5–5.2)
BILIRUBIN TOTAL: 1 mg/dL (ref 0.2–1.2)
BUN: 14 mg/dL (ref 6–23)
CO2: 30 meq/L (ref 19–32)
Calcium: 9.9 mg/dL (ref 8.4–10.5)
Chloride: 105 mEq/L (ref 96–112)
Creatinine, Ser: 1.25 mg/dL (ref 0.40–1.50)
GFR: 60.59 mL/min (ref >60.00)
GLUCOSE: 74 mg/dL (ref 70–99)
Potassium: 4.7 mEq/L (ref 3.5–5.1)
SODIUM: 141 meq/L (ref 135–145)
TOTAL PROTEIN: 6.7 g/dL (ref 6.0–8.3)

## 2018-02-11 LAB — CBC WITH DIFFERENTIAL/PLATELET
BASOS ABS: 0 10*3/uL (ref 0.0–0.1)
Basophils Relative: 0.3 % (ref 0.0–3.0)
EOS PCT: 1.3 % (ref 0.0–5.0)
Eosinophils Absolute: 0.1 10*3/uL (ref 0.0–0.7)
HCT: 47.2 % (ref 39.0–52.0)
Hemoglobin: 16.2 g/dL (ref 13.0–17.0)
LYMPHS ABS: 4.7 10*3/uL — AB (ref 0.7–4.0)
Lymphocytes Relative: 47.3 % — ABNORMAL HIGH (ref 12.0–46.0)
MCHC: 34.3 g/dL (ref 30.0–36.0)
MCV: 89.4 fl (ref 78.0–100.0)
MONOS PCT: 7.3 % (ref 3.0–12.0)
Monocytes Absolute: 0.7 10*3/uL (ref 0.1–1.0)
NEUTROS ABS: 4.4 10*3/uL (ref 1.4–7.7)
NEUTROS PCT: 43.8 % (ref 43.0–77.0)
PLATELETS: 147 10*3/uL — AB (ref 150.0–400.0)
RBC: 5.28 Mil/uL (ref 4.22–5.81)
RDW: 14.3 % (ref 11.5–15.5)
WBC: 10 10*3/uL (ref 4.0–10.5)

## 2018-02-11 LAB — TSH: TSH: 0.09 u[IU]/mL — ABNORMAL LOW (ref 0.35–4.50)

## 2018-02-11 MED ORDER — APIXABAN 5 MG PO TABS: 5.0000 mg | ORAL_TABLET | Freq: Two times a day (BID) | ORAL | 3 refills | Status: DC

## 2018-02-11 NOTE — Patient Instructions (Addendum)
We'll contact you with your lab report. Stop aspirin when you start eliquis 5mg  twice day.  We will call about your referral.  Rosaria Ferries or Azalee Course will call you if you don't see one of them on the way out.  Take care.  Glad to see you.  Don't cancel your surgery yet but it may need to be pushed back.

## 2018-02-11 NOTE — Progress Notes (Signed)
Pleasant 72 year old male who came in for routine preop evaluation.  He is planning on having orthopedic surgery as long as he does not have any contraindications.  He has been feeling well at baseline.  He denies any chest pain.  He has mild dyspnea on exertion that he attributed to relative decrease in activity and relative deconditioning secondary to his knee pain.  No bilateral lower extremity edema.  He does not have a cardiac history.  We talked about his situation in general.  We got a preop EKG.  It looks like he is in atrial fibrillation.  Unknown duration.  He does not feel any heart racing.  He is already on beta-blocker at baseline and this may mask some of his symptoms.  He is not on anticoagulation at this point other than aspirin.  He is still on his baseline medications with antihypertensive, thyroid replacement, etc.  Reviewed with patient.  He has no history of a heart murmur prior to today.   PMH and SH reviewed  ROS: Per HPI unless specifically indicated in ROS section   Meds, vitals, and allergies reviewed.   GEN: nad, alert and oriented HEENT: mucous membranes moist NECK: supple w/o LA CV: IRR, not tachy.  Soft SEM noted, new finding.  PULM: ctab, no inc wob ABD: soft, +bs EXT: no edema SKIN: no acute rash

## 2018-02-12 ENCOUNTER — Encounter: Payer: Self-pay | Admitting: Family Medicine

## 2018-02-12 ENCOUNTER — Other Ambulatory Visit: Payer: Self-pay | Admitting: Family Medicine

## 2018-02-12 DIAGNOSIS — I4819 Other persistent atrial fibrillation: Secondary | ICD-10-CM | POA: Insufficient documentation

## 2018-02-12 DIAGNOSIS — I4891 Unspecified atrial fibrillation: Secondary | ICD-10-CM

## 2018-02-12 DIAGNOSIS — R011 Cardiac murmur, unspecified: Secondary | ICD-10-CM | POA: Insufficient documentation

## 2018-02-12 DIAGNOSIS — E039 Hypothyroidism, unspecified: Secondary | ICD-10-CM

## 2018-02-12 MED ORDER — LEVOTHYROXINE SODIUM 150 MCG PO TABS: ORAL_TABLET | ORAL | Status: DC

## 2018-02-12 NOTE — Assessment & Plan Note (Signed)
New issue.  Needs echo to delineate his anatomy.  Pathophysiology of murmurs discussed with patient.  Does not radiate to the carotids.  Still okay for outpatient follow-up.  Does not appear to be in failure.  Refer to cardiology concurrently given the new onset/new diagnosis of atrial fibrillation.

## 2018-02-12 NOTE — Assessment & Plan Note (Signed)
New diagnosis.  Likely has some of his symptoms are masked by being on beta-blocker already.  No chest pain.  Not short of breath other than some with activity and this may be related to deconditioning.  Reasonable to check routine labs.  Check echo.  Stop aspirin.  Start Eliquis.  Rationale and routine cautions discussed with patient.  Pathophysiology of atrial fibrillation discussed with patient.  Given this recent change in his situation we will contact orthopedics not to cancel his surgery but could potentially push it back for a little while.  I appreciate the help of all involved.  >40 minutes spent in face to face time with patient, >50% spent in counselling or coordination of care.

## 2018-02-13 ENCOUNTER — Telehealth (INDEPENDENT_AMBULATORY_CARE_PROVIDER_SITE_OTHER): Payer: Self-pay | Admitting: Orthopedic Surgery

## 2018-02-13 NOTE — Telephone Encounter (Signed)
Pre-operative clearance request sent to Dr. Elsie Stain for LEFT TOTAL KNEE ARTHROPLASTY along with request for instructions for blood thinner and aspirin.  Request received today with instructions to defer surgery for now. Patient has new diagnosis of AFib with cardiologist.  Patient has cardiology evaluation August 17th with Dr. Harrell Gave End.  Patient is aware that he is not cleared at this time.

## 2018-02-14 NOTE — Telephone Encounter (Signed)
Ok thx.

## 2018-02-14 NOTE — Telephone Encounter (Signed)
fyi

## 2018-02-19 ENCOUNTER — Other Ambulatory Visit: Payer: Self-pay

## 2018-02-19 ENCOUNTER — Ambulatory Visit (INDEPENDENT_AMBULATORY_CARE_PROVIDER_SITE_OTHER): Payer: Medicare Other

## 2018-02-19 ENCOUNTER — Ambulatory Visit: Payer: Medicare Other | Admitting: Internal Medicine

## 2018-02-19 ENCOUNTER — Encounter: Payer: Self-pay | Admitting: Internal Medicine

## 2018-02-19 VITALS — BP 146/90 | HR 78 | Ht 73.0 in | Wt 267.0 lb

## 2018-02-19 DIAGNOSIS — I4891 Unspecified atrial fibrillation: Secondary | ICD-10-CM | POA: Diagnosis not present

## 2018-02-19 DIAGNOSIS — Z01810 Encounter for preprocedural cardiovascular examination: Secondary | ICD-10-CM | POA: Diagnosis not present

## 2018-02-19 DIAGNOSIS — I481 Persistent atrial fibrillation: Secondary | ICD-10-CM

## 2018-02-19 DIAGNOSIS — I10 Essential (primary) hypertension: Secondary | ICD-10-CM

## 2018-02-19 DIAGNOSIS — I4819 Other persistent atrial fibrillation: Secondary | ICD-10-CM

## 2018-02-19 DIAGNOSIS — R0600 Dyspnea, unspecified: Secondary | ICD-10-CM

## 2018-02-19 DIAGNOSIS — R0609 Other forms of dyspnea: Secondary | ICD-10-CM

## 2018-02-19 MED ORDER — PERFLUTREN LIPID MICROSPHERE
1.0000 mL | INTRAVENOUS | Status: AC | PRN
  Administered 2018-02-19: 2 mL via INTRAVENOUS

## 2018-02-19 NOTE — H&P (View-Only) (Signed)
New Outpatient Visit Date: 02/19/2018  Referring Provider: Tonia Ghent, MD [place], [place] [address]  Chief Complaint: Atrial fibrillation  HPI:  [name] is a 72 y.o. male who is being seen today for the evaluation of atrial fibrillation at the request of Dr. Damita Dunnings. He has a history of hypertension, hyperlipidemia, hypothyroidism, obesity, and osteoarthritis.  He was seen for preop evaluation in anticipation of orthopedic surgery by Dr. Damita Dunnings on 02/11/18, at which time he was noted to be in atrial fibrillation.  He reports worsening left knee pain over the last several years, which has limited his activity.  He was planning to undergo left total knee arthroplasty with Dr. Marlou Sa at the [facility] of this month, though this has been put on hold in light of recent a-fib diagnosis.  [name] has otherwise felt well other than mild exertional dyspnea.  He has attributed this to deconditioning secondary to being sedentary on account of his knee pain.  He denies chest pain, palpitations, lightheadedness, and orthopnea.  He endorses rare episodes of PND over the last few weeks as well as a long history of intermittent dependent calf edema.  He snores at night and has never been evaluated for sleep apnea.  He does not feel refreshed when he awakens in the morning but only naps on rare occasions.  [name] denies a history of prior heart disease or testing up until today's echocardiogram.  However, he was told that he may have had rheumatic fever as a child.  He was started on apixaban 5 mg BID by Dr. Damita Dunnings last week, which he has been tolerating well.  He has not missed any doses.  --------------------------------------------------------------------------------------------------  Cardiovascular History & Procedures: Cardiovascular Problems:  Atrial fibrillation  Risk Factors:  Hypertension, hyperlipidemia, obesity, male gender, and age > 96  Cath/PCI:  None  CV  Surgery:  None  EP Procedures and Devices:  None  Non-Invasive Evaluation(s):  Echo (02/19/18): Normal LV size with mild LVH.  LVEF 60-65% with normal wall motion.  Probably mild aortic regurgitation.  Mildly dilated thoracic aorta.  Mild mitral regurgitation.  Mild left atrial enlargement.  Mildly dilated right ventricle with normal contraction.  ABI's (12/26/17): Right 1.19, left 1.23  Recent CV Pertinent Labs: Lab Results  Component Value Date   CHOL 140 07/25/2017   HDL 40.50 07/25/2017   LDLCALC 73 07/25/2017   TRIG 132.0 07/25/2017   CHOLHDL 3 07/25/2017   K 4.7 02/11/2018   BUN 14 02/11/2018   CREATININE 1.25 02/11/2018    --------------------------------------------------------------------------------------------------  Past Medical History:  Diagnosis Date  . Acute lower GI bleeding after colonoscopy and polypectomy, resolved 12/12/2015  . Atrial fibrillation ([place])   . Cardiac murmur   . Cataract    bilateral surgery to remove  . Colon polyp   . Constipation   . Hemorrhoids   . Hepatitis    self resolved, likely food exposure, 1974.   Marland Kitchen Hx of adenomatous colonic polyps 12/05/2015  . Hyperlipidemia   . Hypertension   . Hypothyroidism   . Skin cancer    basal cell, R ear, MOHS    Past Surgical History:  Procedure Laterality Date  . BROW LIFT Bilateral 10/23/2016   Procedure: BLEPHAROPLASTY upper eyelid with excess skin;  Surgeon: Karle Starch, MD;  Location: [facility];  Service: Ophthalmology;  Laterality: Bilateral;  MAC  . CATARACT EXTRACTION  1986   OD  . CATARACT EXTRACTION Bilateral 1995  x 2 for right and left   . COLONOSCOPY    . ECTROPION REPAIR Bilateral 10/23/2016   Procedure: REPAIR OF ECTROPION sutures, extensive;  Surgeon: Karle Starch, MD;  Location: [facility];  Service: Ophthalmology;  Laterality: Bilateral;  . LIPOMA EXCISION  06/1997   left neck ([place])  . MOHS SURGERY Right 2013   behind right ear  . TONSILLECTOMY     . VASECTOMY  1982    Current Meds  Medication Sig  . apixaban (ELIQUIS) 5 MG TABS tablet Take 1 tablet (5 mg total) by mouth 2 (two) times daily.  . diclofenac sodium (VOLTAREN) 1 % GEL Apply 4 g topically 4 (four) times daily as needed.  . doxazosin (CARDURA) 4 MG tablet Take 1 tablet (4 mg total) by mouth daily.  . finasteride (PROSCAR) 5 MG tablet Take 1 tablet (5 mg total) by mouth daily.  Marland Kitchen levothyroxine (SYNTHROID, LEVOTHROID) 150 MCG tablet Take 1 tablet a day except for 1/2 tablet on Sundays.  Total of 6.5 tablets/week.  . metoprolol succinate (TOPROL XL) 25 MG 24 hr tablet Take 1 tablet (25 mg total) by mouth daily.  . Multiple Vitamin (MULTIVITAMIN) tablet Take 1 tablet by mouth daily.    . Omega-3 Fatty Acids (FISH OIL) 1000 MG CAPS Take by mouth.  . psyllium (METAMUCIL) 58.6 % powder Take 2 teaspoons with water daily.  . ramipril (ALTACE) 10 MG capsule Take 1 capsule (10 mg total) by mouth daily.  . simvastatin (ZOCOR) 40 MG tablet Take 1 tablet (40 mg total) by mouth at bedtime.   Current Facility-Administered Medications for the 02/19/18 encounter (Office Visit) with Ramiya Delahunty, Harrell Gave, MD  Medication  . perflutren lipid microspheres (DEFINITY) IV suspension    Allergies: Sulfonamide derivatives  Social History   Tobacco Use  . Smoking status: Former Smoker    Packs/day: 2.00    Years: 20.00    Pack years: 40.00    Types: Cigarettes    Last attempt to quit: 08/07/1983    Years since quitting: 34.5  . Smokeless tobacco: Never Used  . Tobacco comment: quit over 30 years ago  Substance Use Topics  . Alcohol use: Yes    Alcohol/week: 4.2 oz    Types: 7 Shots of liquor per week    Comment: a drink every evening, scotch or whiskey  . Drug use: No    Family History  Problem Relation Age of Onset  . Heart disease Paternal Grandfather        MI, old age  . Stroke Mother   . Lung cancer Maternal Grandfather        smoker  . Stroke Maternal Grandfather   . Colon  cancer Neg Hx   . Colon polyps Neg Hx   . Esophageal cancer Neg Hx   . Rectal cancer Neg Hx   . Stomach cancer Neg Hx   . Bladder Cancer Neg Hx   . Prostate cancer Neg Hx     Review of Systems: A 12-system review of systems was performed and was negative except as noted in the HPI.  --------------------------------------------------------------------------------------------------  Physical Exam: BP (!) 146/90 (BP Location: Right Arm, Patient Position: Sitting, Cuff Size: Normal)   Pulse 78   Ht _0  (1.854 m)   Wt 267 lb (121.1 kg)   BMI 35.23 kg/m   General:  Obese man, seated comfortably in the exam room. HEENT: No conjunctival pallor or scleral icterus. Moist mucous membranes. OP clear. Neck: Supple without lymphadenopathy,  thyromegaly, JVD, or HJR. No carotid bruit. Lungs: Normal work of breathing. Clear to auscultation bilaterally without wheezes or crackles. Heart: Irregularly irregular without murmurs, rubs, or gallops. Non-displaced PMI. Abd: Bowel sounds present. Soft, NT/ND without hepatosplenomegaly Ext: 1+ pretibial edema. Radial, PT, and DP pulses are 2+ bilaterally Skin: Warm and dry without rash. Neuro: CNIII-XII intact. Strength and fine-touch sensation intact in upper and lower extremities bilaterally. Psych: Normal mood and affect.  EKG:  Atrial fibrillation (ventricular rate 78 bpm).  Left axis deviation.  No significant change since 02/11/18.  Lab Results  Component Value Date   WBC 10.0 02/11/2018   HGB 16.2 02/11/2018   HCT 47.2 02/11/2018   MCV 89.4 02/11/2018   PLT 147.0 (L) 02/11/2018    Lab Results  Component Value Date   NA 141 02/11/2018   K 4.7 02/11/2018   CL 105 02/11/2018   CO2 30 02/11/2018   BUN 14 02/11/2018   CREATININE 1.25 02/11/2018   GLUCOSE 74 02/11/2018   ALT 8 02/11/2018    Lab Results  Component Value Date   CHOL 140 07/25/2017   HDL 40.50 07/25/2017   LDLCALC 73 07/25/2017   TRIG 132.0 07/25/2017   CHOLHDL 3  07/25/2017     --------------------------------------------------------------------------------------------------  ASSESSMENT AND PLAN: Persistent atrial fibrillation Minimally symptomatic (DOE that could also be due to obesity and deconditioning).  We will continue metoprolol succinate 25 mg daily and apixaban 5 mg daily, given adequate heart rate control and CHADSVASC score of at least 2.  We have discussed rate versus rhythm control strategies and have agreed pursue cardioversion after [name] has completed at least 4 weeks of continuous anticoagulation.  I will have him come in the week before the scheduled cardioversion for labs and an EKG to confirm that he remains in a-fib.  Dyspnea on exertion Most likely due to deconditioning.  However, [name] has several risk factors for CAD.  Given progressive dyspnea and new a-fib, we have agreed to obtain a pharmacologic myocardial perfusion stress test at his convenience.  [name] also has signs and symptoms consistent with obstructive sleep apnea.  We will defer referral for sleep study until after completion of DCCV and stress test.  Hypertension Blood pressure mildly elevated today.  I have encouraged lifestyle modifications.  No medication changes today.  Preop risk assessment We have agreed to defer elective surgeries until after MPI and DCCV.  [name] will need to complete at least 4 weeks of uninterrupted anticoagulation following cardioversion.  If his stress test is low risk, I think it would be fine for him to proceed with knee replacement this fall.  Follow-up: Return to clinic in late August.  Nelva Bush, MD 02/19/2018 3:33 PM

## 2018-02-19 NOTE — Progress Notes (Signed)
 New Outpatient Visit Date: 02/19/2018  Referring Provider: James [name], James S, MD [address] [place], [place] [address]  Chief Complaint: Atrial fibrillation  HPI:  James [name] is a 71 y.o. male who is being seen today for the evaluation of atrial fibrillation at the request of James [name]. He has a history of hypertension, hyperlipidemia, hypothyroidism, obesity, and osteoarthritis.  He was seen for preop evaluation in anticipation of orthopedic surgery by James [name] on 02/11/18, at which time he was noted to be in atrial fibrillation.  He reports worsening left knee pain over the last several years, which has limited his activity.  He was planning to undergo left total knee arthroplasty with Dr. Dean at the [facility] of this month, though this has been put on hold in light of recent a-fib diagnosis.  James [name] has otherwise felt well other than mild exertional dyspnea.  He has attributed this to deconditioning secondary to being sedentary on account of his knee pain.  He denies chest pain, palpitations, lightheadedness, and orthopnea.  He endorses rare episodes of PND over the last few weeks as well as a long history of intermittent dependent calf edema.  He snores at night and has never been evaluated for sleep apnea.  He does not feel refreshed when he awakens in the morning but only naps on rare occasions.  James [name] denies a history of prior heart disease or testing up until today'[name] echocardiogram.  However, he was told that he may have had rheumatic fever as a child.  He was started on apixaban 5 mg BID by James [name] last week, which he has been tolerating well.  He has not missed any doses.  --------------------------------------------------------------------------------------------------  Cardiovascular History & Procedures: Cardiovascular Problems:  Atrial fibrillation  Risk Factors:  Hypertension, hyperlipidemia, obesity, male gender, and age > 55  Cath/PCI:  None  CV  Surgery:  None  EP Procedures and Devices:  None  Non-Invasive Evaluation([name]):  Echo (02/19/18): Normal LV size with mild LVH.  LVEF 60-65% with normal wall motion.  Probably mild aortic regurgitation.  Mildly dilated thoracic aorta.  Mild mitral regurgitation.  Mild left atrial enlargement.  Mildly dilated right ventricle with normal contraction.  ABI'[name] (12/26/17): Right 1.19, left 1.23  Recent CV Pertinent Labs: Lab Results  Component Value Date   CHOL 140 07/25/2017   HDL 40.50 07/25/2017   LDLCALC 73 07/25/2017   TRIG 132.0 07/25/2017   CHOLHDL 3 07/25/2017   K 4.7 02/11/2018   BUN 14 02/11/2018   CREATININE 1.25 02/11/2018    --------------------------------------------------------------------------------------------------  Past Medical History:  Diagnosis Date  . Acute lower GI bleeding after colonoscopy and polypectomy, resolved 12/12/2015  . Atrial fibrillation (HCC)   . Cardiac murmur   . Cataract    bilateral surgery to remove  . Colon polyp   . Constipation   . Hemorrhoids   . Hepatitis    self resolved, likely food exposure, 1974.   . Hx of adenomatous colonic polyps 12/05/2015  . Hyperlipidemia   . Hypertension   . Hypothyroidism   . Skin cancer    basal cell, R ear, MOHS    Past Surgical History:  Procedure Laterality Date  . BROW LIFT Bilateral 10/23/2016   Procedure: BLEPHAROPLASTY upper eyelid with excess skin;  Surgeon: Amy M Fowler, MD;  Location: [facility];  Service: Ophthalmology;  Laterality: Bilateral;  MAC  . CATARACT EXTRACTION  1986   OD  . CATARACT EXTRACTION Bilateral 1995     x 2 for right and left   . COLONOSCOPY    . ECTROPION REPAIR Bilateral 10/23/2016   Procedure: REPAIR OF ECTROPION sutures, extensive;  Surgeon: Amy M Fowler, MD;  Location: [facility];  Service: Ophthalmology;  Laterality: Bilateral;  . LIPOMA EXCISION  06/1997   left neck ([place])  . MOHS SURGERY Right 2013   behind right ear  . TONSILLECTOMY     . VASECTOMY  1982    Current Meds  Medication Sig  . apixaban (ELIQUIS) 5 MG TABS tablet Take 1 tablet (5 mg total) by mouth 2 (two) times daily.  . diclofenac sodium (VOLTAREN) 1 % GEL Apply 4 g topically 4 (four) times daily as needed.  . doxazosin (CARDURA) 4 MG tablet Take 1 tablet (4 mg total) by mouth daily.  . finasteride (PROSCAR) 5 MG tablet Take 1 tablet (5 mg total) by mouth daily.  . levothyroxine (SYNTHROID, LEVOTHROID) 150 MCG tablet Take 1 tablet a day except for 1/2 tablet on Sundays.  Total of 6.5 tablets/week.  . metoprolol succinate (TOPROL XL) 25 MG 24 hr tablet Take 1 tablet (25 mg total) by mouth daily.  . Multiple Vitamin (MULTIVITAMIN) tablet Take 1 tablet by mouth daily.    . Omega-3 Fatty Acids (FISH OIL) 1000 MG CAPS Take by mouth.  . psyllium (METAMUCIL) 58.6 % powder Take 2 teaspoons with water daily.  . ramipril (ALTACE) 10 MG capsule Take 1 capsule (10 mg total) by mouth daily.  . simvastatin (ZOCOR) 40 MG tablet Take 1 tablet (40 mg total) by mouth at bedtime.   Current Facility-Administered Medications for the 02/19/18 encounter (Office Visit) with James King, MD  Medication  . perflutren lipid microspheres (DEFINITY) IV suspension    Allergies: Sulfonamide derivatives  Social History   Tobacco Use  . Smoking status: Former Smoker    Packs/day: 2.00    Years: 20.00    Pack years: 40.00    Types: Cigarettes    Last attempt to quit: 08/07/1983    Years since quitting: 34.5  . Smokeless tobacco: Never Used  . Tobacco comment: quit over 30 years ago  Substance Use Topics  . Alcohol use: Yes    Alcohol/week: 4.2 oz    Types: 7 Shots of liquor per week    Comment: a drink every evening, scotch or whiskey  . Drug use: No    Family History  Problem Relation Age of Onset  . Heart disease Paternal Grandfather        MI, old age  . Stroke Mother   . Lung cancer Maternal Grandfather        smoker  . Stroke Maternal Grandfather   . Colon  cancer Neg Hx   . Colon polyps Neg Hx   . Esophageal cancer Neg Hx   . Rectal cancer Neg Hx   . Stomach cancer Neg Hx   . Bladder Cancer Neg Hx   . Prostate cancer Neg Hx     Review of Systems: A 12-system review of systems was performed and was negative except as noted in the HPI.  --------------------------------------------------------------------------------------------------  Physical Exam: BP (!) 146/90 (BP Location: Right Arm, Patient Position: Sitting, Cuff Size: Normal)   Pulse 78   Ht 6' 1" (1.854 m)   Wt 267 lb (121.1 kg)   BMI 35.23 kg/m   General:  Obese man, seated comfortably in the exam room. HEENT: No conjunctival pallor or scleral icterus. Moist mucous membranes. OP clear. Neck: Supple without lymphadenopathy,   thyromegaly, JVD, or HJR. No carotid bruit. Lungs: Normal work of breathing. Clear to auscultation bilaterally without wheezes or crackles. Heart: Irregularly irregular without murmurs, rubs, or gallops. Non-displaced PMI. Abd: Bowel sounds present. Soft, NT/ND without hepatosplenomegaly Ext: 1+ pretibial edema. Radial, PT, and DP pulses are 2+ bilaterally Skin: Warm and dry without rash. Neuro: CNIII-XII intact. Strength and fine-touch sensation intact in upper and lower extremities bilaterally. Psych: Normal mood and affect.  EKG:  Atrial fibrillation (ventricular rate 78 bpm).  Left axis deviation.  No significant change since 02/11/18.  Lab Results  Component Value Date   WBC 10.0 02/11/2018   HGB 16.2 02/11/2018   HCT 47.2 02/11/2018   MCV 89.4 02/11/2018   PLT 147.0 (L) 02/11/2018    Lab Results  Component Value Date   NA 141 02/11/2018   K 4.7 02/11/2018   CL 105 02/11/2018   CO2 30 02/11/2018   BUN 14 02/11/2018   CREATININE 1.25 02/11/2018   GLUCOSE 74 02/11/2018   ALT 8 02/11/2018    Lab Results  Component Value Date   CHOL 140 07/25/2017   HDL 40.50 07/25/2017   LDLCALC 73 07/25/2017   TRIG 132.0 07/25/2017   CHOLHDL 3  07/25/2017     --------------------------------------------------------------------------------------------------  ASSESSMENT AND PLAN: Persistent atrial fibrillation Minimally symptomatic (DOE that could also be due to obesity and deconditioning).  We will continue metoprolol succinate 25 mg daily and apixaban 5 mg daily, given adequate heart rate control and CHADSVASC score of at least 2.  We have discussed rate versus rhythm control strategies and have agreed pursue cardioversion after Mr. Cronkright has completed at least 4 weeks of continuous anticoagulation.  I will have him come in the week before the scheduled cardioversion for labs and an EKG to confirm that he remains in a-fib.  Dyspnea on exertion Most likely due to deconditioning.  However, Mr. Carol has several risk factors for CAD.  Given progressive dyspnea and new a-fib, we have agreed to obtain a pharmacologic myocardial perfusion stress test at his convenience.  Mr. Eskelson also has signs and symptoms consistent with obstructive sleep apnea.  We will defer referral for sleep study until after completion of DCCV and stress test.  Hypertension Blood pressure mildly elevated today.  I have encouraged lifestyle modifications.  No medication changes today.  Preop risk assessment We have agreed to defer elective surgeries until after MPI and DCCV.  Mr. Portugal will need to complete at least 4 weeks of uninterrupted anticoagulation following cardioversion.  If his stress test is low risk, I think it would be fine for him to proceed with knee replacement this fall.  Follow-up: Return to clinic in late August.  Ondine Gemme, MD 02/19/2018 3:33 PM  

## 2018-02-19 NOTE — Patient Instructions (Signed)
Medication Instructions:  Your physician recommends that you continue on your current medications as directed. Please refer to the Current Medication list given to you today.   Labwork: Your physician recommends that you return for lab work in: about 2 [facility] LAB WORK AND EKG NURSE VISIT.  (BMET, CBC).   Testing/Procedures: You are scheduled for a Cardioversion on ___08/13/19____ with Dr.__END_____ Please arrive at the [facility] at _06:30_ a.m. on the day of your procedure.  DIET INSTRUCTIONS:  Nothing to eat or drink after midnight except your medications with a              sip of water.         1) Labs: __PRIOR TO_____  2) Medications:  YOU MAY TAKE ALL of your remaining medications with a small amount of water.  3) Must have a responsible person to drive you home.  4) Bring a current list of your medications and current insurance cards.    If you have any questions after you get home, please call the office at [place]  Your caregiver has ordered a Stress Test with nuclear imaging. The purpose of this test is to evaluate the blood supply to your heart muscle. This procedure is referred to as a "Non-Invasive Stress Test." This is because other than having an IV started in your vein, nothing is inserted or "invades" your body. Cardiac stress tests are done to find areas of poor blood flow to the heart by determining the extent of coronary artery disease (CAD). Some patients exercise on a treadmill, which naturally increases the blood flow to your heart, while others who are  unable to walk on a treadmill due to physical limitations have a pharmacologic/chemical stress agent called Lexiscan . This medicine will mimic walking on a treadmill by temporarily increasing your coronary blood flow.   Please note: these test may take anywhere between 2-4 hours to complete  PLEASE REPORT TO [facility] AT THE FIRST DESK  WILL DIRECT YOU WHERE TO GO  Date of Procedure:_____________________________________  Arrival Time for Procedure:______________________________   PLEASE NOTIFY THE OFFICE AT LEAST 24 HOURS IN ADVANCE IF YOU ARE UNABLE TO KEEP YOUR APPOINTMENT.  [phone] AND  PLEASE NOTIFY NUCLEAR MEDICINE AT [facility] AT LEAST 24 HOURS IN ADVANCE IF YOU ARE UNABLE TO KEEP YOUR APPOINTMENT. [phone]  How to prepare for your Myoview test:  1. Do not eat or drink after midnight 2. No caffeine for 24 hours prior to test 3. No smoking 24 hours prior to test. 4. Your medication may be taken with water.  If your doctor stopped a medication because of this test, do not take that medication. 5. Ladies, please do not wear dresses.  Skirts or pants are appropriate. Please wear a short sleeve shirt. 6. No perfume, cologne or lotion. 7. Wear comfortable walking shoes.      Follow-Up: Your physician recommends that you schedule a follow-up appointment in SOMETIME BETWEEN AUGUST 6-9TH FOR LAB WORK AND NURSE VISIT FOR EKG.  Your physician recommends that you schedule a follow-up appointment END OF AUGUST AFTER CARDIOVERSION WITH DR END OR APP.   If you need a refill on your cardiac medications before your next appointment, please call your pharmacy.     Electrical Cardioversion Electrical cardioversion is the delivery of a jolt of electricity to restore a normal rhythm to the heart. A rhythm that is too  fast or is not regular keeps the heart from pumping well. In this procedure, sticky patches or metal paddles are placed on the chest to deliver electricity to the heart from a device. This procedure may be done in an emergency if:  There is low or no blood pressure as a result of the heart rhythm.  Normal rhythm must be restored as fast as possible to protect the brain and heart from further damage.  It may save a life.  This procedure may also be done for irregular or fast heart rhythms that are not  immediately life-threatening. Tell a health care provider about:  Any allergies you have.  All medicines you are taking, including vitamins, herbs, eye drops, creams, and over-the-counter medicines.  Any problems you or family members have had with anesthetic medicines.  Any blood disorders you have.  Any surgeries you have had.  Any medical conditions you have.  Whether you are pregnant or may be pregnant. What are the risks? Generally, this is a safe procedure. However, problems may occur, including:  Allergic reactions to medicines.  A blood clot that breaks free and travels to other parts of your body.  The possible return of an abnormal heart rhythm within hours or days after the procedure.  Your heart stopping (cardiac arrest). This is rare.  What happens before the procedure? Medicines  Your health care provider may have you start taking: ? Blood-thinning medicines (anticoagulants) so your blood does not clot as easily. ? Medicines may be given to help stabilize your heart rate and rhythm.  Ask your health care provider about changing or stopping your regular medicines. This is especially important if you are taking diabetes medicines or blood thinners. General instructions  Plan to have someone take you home from the hospital or clinic.  If you will be going home right after the procedure, plan to have someone with you for 24 hours.  Follow instructions from your health care provider about eating or drinking restrictions. What happens during the procedure?  To lower your risk of infection: ? Your health care team will wash or sanitize their hands. ? Your skin will be washed with soap.  An IV tube will be inserted into one of your veins.  You will be given a medicine to help you relax (sedative).  Sticky patches (electrodes) or metal paddles may be placed on your chest.  An electrical shock will be delivered. The procedure may vary among health care  providers and hospitals. What happens after the procedure?  Your blood pressure, heart rate, breathing rate, and blood oxygen level will be monitored until the medicines you were given have worn off.  Do not drive for 24 hours if you were given a sedative.  Your heart rhythm will be watched to make sure it does not change. This information is not intended to replace advice given to you by your health care provider. Make sure you discuss any questions you have with your health care provider. Document Released: 07/13/2002 Document Revised: 03/21/2016 Document Reviewed: 01/27/2016 Elsevier Interactive Patient Education  2017 [employer].     Cardiac Nuclear Scan A cardiac nuclear scan is a test that measures blood flow to the heart when a person is resting and when he or she is exercising. The test looks for problems such as:  Not enough blood reaching a portion of the heart.  The heart muscle not working normally.  You may need this test if:  You have heart disease.  You have had abnormal lab results.  You have had heart surgery or angioplasty.  You have chest pain.  You have shortness of breath.  In this test, a radioactive dye (tracer) is injected into your bloodstream. After the tracer has traveled to your heart, an imaging device is used to measure how much of the tracer is absorbed by or distributed to various areas of your heart. This procedure is usually done at a hospital and takes 2-4 hours. Tell a health care provider about:  Any allergies you have.  All medicines you are taking, including vitamins, herbs, eye drops, creams, and over-the-counter medicines.  Any problems you or family members have had with the use of anesthetic medicines.  Any blood disorders you have.  Any surgeries you have had.  Any medical conditions you have.  Whether you are pregnant or may be pregnant. What are the risks? Generally, this is a safe procedure. However, problems may  occur, including:  Serious chest pain and heart attack. This is only a risk if the stress portion of the test is done.  Rapid heartbeat.  Sensation of warmth in your chest. This usually passes quickly.  What happens before the procedure?  Ask your health care provider about changing or stopping your regular medicines. This is especially important if you are taking diabetes medicines or blood thinners.  Remove your jewelry on the day of the procedure. What happens during the procedure?  An IV tube will be inserted into one of your veins.  Your health care provider will inject a small amount of radioactive tracer through the tube.  You will wait for 20-40 minutes while the tracer travels through your bloodstream.  Your heart activity will be monitored with an electrocardiogram (ECG).  You will lie down on an exam table.  Images of your heart will be taken for about 15-20 minutes.  You may be asked to exercise on a treadmill or stationary bike. While you exercise, your heart's activity will be monitored with an ECG, and your blood pressure will be checked. If you are unable to exercise, you may be given a medicine to increase blood flow to parts of your heart.  When blood flow to your heart has peaked, a tracer will again be injected through the IV tube.  After 20-40 minutes, you will get back on the exam table and have more images taken of your heart.  When the procedure is over, your IV tube will be removed. The procedure may vary among health care providers and hospitals. Depending on the type of tracer used, scans may need to be repeated 3-4 hours later. What happens after the procedure?  Unless your health care provider tells you otherwise, you may return to your normal schedule, including diet, activities, and medicines.  Unless your health care provider tells you otherwise, you may increase your fluid intake. This will help flush the contrast dye from your body. Drink  enough fluid to keep your urine clear or pale yellow.  It is up to you to get your test results. Ask your health care provider, or the department that is doing the test, when your results will be ready. Summary  A cardiac nuclear scan measures the blood flow to the heart when a person is resting and when he or she is exercising.  You may need this test if you are at risk for heart disease.  Tell your health care provider if you are pregnant.  Unless your health care provider tells  you otherwise, increase your fluid intake. This will help flush the contrast dye from your body. Drink enough fluid to keep your urine clear or pale yellow. This information is not intended to replace advice given to you by your health care provider. Make sure you discuss any questions you have with your health care provider. Document Released: 08/17/2004 Document Revised: 07/25/2016 Document Reviewed: 07/01/2013 Elsevier Interactive Patient Education  2017 [employer].

## 2018-02-21 ENCOUNTER — Other Ambulatory Visit (HOSPITAL_COMMUNITY): Payer: Medicare Other

## 2018-02-28 ENCOUNTER — Encounter
Admission: RE | Admit: 2018-02-28 | Discharge: 2018-02-28 | Disposition: A | Discharge status: Home / Self Care | Payer: Medicare Other | Source: Ambulatory Visit | Attending: Internal Medicine | Admitting: Internal Medicine

## 2018-02-28 DIAGNOSIS — R0609 Other forms of dyspnea: Secondary | ICD-10-CM | POA: Diagnosis not present

## 2018-02-28 DIAGNOSIS — I481 Persistent atrial fibrillation: Secondary | ICD-10-CM | POA: Diagnosis not present

## 2018-02-28 DIAGNOSIS — R0600 Dyspnea, unspecified: Secondary | ICD-10-CM

## 2018-02-28 DIAGNOSIS — I4819 Other persistent atrial fibrillation: Secondary | ICD-10-CM

## 2018-02-28 LAB — NM MYOCAR MULTI W/SPECT W/WALL MOTION / EF
CHL CUP NUCLEAR SDS: 2
CHL CUP NUCLEAR SRS: 3
CHL CUP RESTING HR STRESS: 80 {beats}/min
CSEPHR: 82 %
LVDIAVOL: 117 mL (ref 62–150)
LVSYSVOL: 39 mL
Peak HR: 123 {beats}/min
SSS: 5
TID: 1.1

## 2018-02-28 MED ORDER — TECHNETIUM TC 99M TETROFOSMIN IV KIT
10.0000 | PACK | Freq: Once | INTRAVENOUS | Status: AC | PRN
  Administered 2018-02-28: 14.13 via INTRAVENOUS

## 2018-02-28 MED ORDER — TECHNETIUM TC 99M TETROFOSMIN IV KIT
30.0000 | PACK | Freq: Once | INTRAVENOUS | Status: AC | PRN
  Administered 2018-02-28: 31.06 via INTRAVENOUS

## 2018-02-28 MED ORDER — REGADENOSON 0.4 MG/5ML IV SOLN
0.4000 mg | Freq: Once | INTRAVENOUS | Status: AC
  Administered 2018-02-28: 0.4 mg via INTRAVENOUS

## 2018-03-04 ENCOUNTER — Inpatient Hospital Stay: Admit: 2018-03-04 | Payer: Medicare Other | Admitting: Orthopedic Surgery

## 2018-03-04 SURGERY — ARTHROPLASTY, KNEE, TOTAL
Anesthesia: General | Laterality: Left

## 2018-03-10 ENCOUNTER — Ambulatory Visit (INDEPENDENT_AMBULATORY_CARE_PROVIDER_SITE_OTHER): Payer: Medicare Other | Admitting: *Deleted

## 2018-03-10 ENCOUNTER — Other Ambulatory Visit (INDEPENDENT_AMBULATORY_CARE_PROVIDER_SITE_OTHER): Payer: Medicare Other

## 2018-03-10 VITALS — BP 142/89 | HR 76 | Ht 73.0 in | Wt 268.5 lb

## 2018-03-10 DIAGNOSIS — I481 Persistent atrial fibrillation: Secondary | ICD-10-CM | POA: Diagnosis not present

## 2018-03-10 DIAGNOSIS — I10 Essential (primary) hypertension: Secondary | ICD-10-CM

## 2018-03-10 DIAGNOSIS — I4819 Other persistent atrial fibrillation: Secondary | ICD-10-CM

## 2018-03-10 DIAGNOSIS — R0609 Other forms of dyspnea: Secondary | ICD-10-CM | POA: Diagnosis not present

## 2018-03-10 DIAGNOSIS — R0600 Dyspnea, unspecified: Secondary | ICD-10-CM

## 2018-03-10 NOTE — Progress Notes (Signed)
1.) Reason for visit: EKG check and labs  2.) Name of MD requesting visit: End*  3.) H&P: atrial fibrillation**  4.) ROS related to problem: Patient here to verify heart rhythm today. He is scheduled for cardioversion on 03/18/18 with Dr End.  Patient has no complaints today. Denies chest pain, shortness of breath, dizziness or swelling.   5.) Assessment and plan per MD: EKG reviewed by Christell Faith. Patient is still in a. Fib. Continue with plan for cardioversion on 03/18/18.  Patient verbalized understanding of preprocedural instructions and appointment date and time.

## 2018-03-11 LAB — CBC WITH DIFFERENTIAL/PLATELET
Basophils Absolute: 0 10*3/uL (ref 0.0–0.2)
Basos: 0 %
EOS (ABSOLUTE): 0.1 10*3/uL (ref 0.0–0.4)
EOS: 2 %
HEMATOCRIT: 46.3 % (ref 37.5–51.0)
Hemoglobin: 16 g/dL (ref 13.0–17.7)
Immature Grans (Abs): 0 10*3/uL (ref 0.0–0.1)
Immature Granulocytes: 0 %
LYMPHS ABS: 3.9 10*3/uL — AB (ref 0.7–3.1)
Lymphs: 42 %
MCH: 30.9 pg (ref 26.6–33.0)
MCHC: 34.6 g/dL (ref 31.5–35.7)
MCV: 89 fL (ref 79–97)
MONOS ABS: 0.4 10*3/uL (ref 0.1–0.9)
Monocytes: 5 %
NEUTROS ABS: 4.9 10*3/uL (ref 1.4–7.0)
Neutrophils: 51 %
Platelets: 161 10*3/uL (ref 150–450)
RBC: 5.18 x10E6/uL (ref 4.14–5.80)
RDW: 14 % (ref 12.3–15.4)
WBC: 9.5 10*3/uL (ref 3.4–10.8)

## 2018-03-13 ENCOUNTER — Telehealth: Payer: Self-pay | Admitting: *Deleted

## 2018-03-13 ENCOUNTER — Other Ambulatory Visit: Payer: Self-pay | Admitting: *Deleted

## 2018-03-13 DIAGNOSIS — I4819 Other persistent atrial fibrillation: Secondary | ICD-10-CM

## 2018-03-13 DIAGNOSIS — Z01810 Encounter for preprocedural cardiovascular examination: Secondary | ICD-10-CM

## 2018-03-13 NOTE — Telephone Encounter (Signed)
No answer. Left message to call back.  [facility].

## 2018-03-13 NOTE — Telephone Encounter (Signed)
-----   Message from Nelva Bush, MD sent at 03/13/2018  4:57 PM EDT ----- I am okay foregoing a BMP but anesthesia would probably like one prior to sedation.  We should check one before cardioversion just to be safe.  Thanks.

## 2018-03-14 ENCOUNTER — Other Ambulatory Visit
Admission: RE | Admit: 2018-03-14 | Discharge: 2018-03-14 | Disposition: A | Discharge status: Home / Self Care | Payer: Medicare Other | Source: Ambulatory Visit | Attending: Internal Medicine | Admitting: Internal Medicine

## 2018-03-14 DIAGNOSIS — I481 Persistent atrial fibrillation: Secondary | ICD-10-CM | POA: Insufficient documentation

## 2018-03-14 DIAGNOSIS — I4819 Other persistent atrial fibrillation: Secondary | ICD-10-CM

## 2018-03-14 DIAGNOSIS — Z01810 Encounter for preprocedural cardiovascular examination: Secondary | ICD-10-CM

## 2018-03-14 LAB — BASIC METABOLIC PANEL
Anion gap: 7 (ref 5–15)
BUN: 16 mg/dL (ref 8–23)
CO2: 28 mmol/L (ref 22–32)
CREATININE: 1.38 mg/dL — AB (ref 0.61–1.24)
Calcium: 9 mg/dL (ref 8.9–10.3)
Chloride: 108 mmol/L (ref 98–111)
GFR calc non Af Amer: 50 mL/min — ABNORMAL LOW (ref >60)
GFR, EST AFRICAN AMERICAN: 58 mL/min — AB (ref >60)
Glucose, Bld: 103 mg/dL — ABNORMAL HIGH (ref 70–99)
POTASSIUM: 4.2 mmol/L (ref 3.5–5.1)
SODIUM: 143 mmol/L (ref 135–145)

## 2018-03-14 NOTE — Telephone Encounter (Signed)
Patient returning call .  Aware to [facility] today [facility] additional labs and will wait for the nurse to call with results .

## 2018-03-17 NOTE — Telephone Encounter (Signed)
BMP resulted. Dr. Saunders Revel reviewed- creatinine up slightly- recommendations are to stay hydrated and proceed with DCCV as scheduled. Results released to the patient via [place].

## 2018-03-18 ENCOUNTER — Encounter: Payer: Self-pay | Admitting: Anesthesiology

## 2018-03-18 ENCOUNTER — Ambulatory Visit: Payer: Medicare Other | Admitting: Anesthesiology

## 2018-03-18 ENCOUNTER — Ambulatory Visit
Admission: RE | Admit: 2018-03-18 | Discharge: 2018-03-18 | Disposition: A | Discharge status: Home / Self Care | Payer: Medicare Other | Source: Ambulatory Visit | Attending: Internal Medicine | Admitting: Internal Medicine

## 2018-03-18 ENCOUNTER — Encounter
Admission: RE | Disposition: A | Discharge status: Home / Self Care | Payer: Self-pay | Source: Ambulatory Visit | Attending: Internal Medicine

## 2018-03-18 DIAGNOSIS — Z7901 Long term (current) use of anticoagulants: Secondary | ICD-10-CM | POA: Diagnosis not present

## 2018-03-18 DIAGNOSIS — I481 Persistent atrial fibrillation: Secondary | ICD-10-CM | POA: Insufficient documentation

## 2018-03-18 DIAGNOSIS — E785 Hyperlipidemia, unspecified: Secondary | ICD-10-CM | POA: Diagnosis not present

## 2018-03-18 DIAGNOSIS — Z79890 Hormone replacement therapy (postmenopausal): Secondary | ICD-10-CM | POA: Diagnosis not present

## 2018-03-18 DIAGNOSIS — E039 Hypothyroidism, unspecified: Secondary | ICD-10-CM | POA: Insufficient documentation

## 2018-03-18 DIAGNOSIS — I10 Essential (primary) hypertension: Secondary | ICD-10-CM | POA: Insufficient documentation

## 2018-03-18 DIAGNOSIS — Z79899 Other long term (current) drug therapy: Secondary | ICD-10-CM | POA: Insufficient documentation

## 2018-03-18 DIAGNOSIS — Z85828 Personal history of other malignant neoplasm of skin: Secondary | ICD-10-CM | POA: Diagnosis not present

## 2018-03-18 DIAGNOSIS — I4891 Unspecified atrial fibrillation: Secondary | ICD-10-CM | POA: Diagnosis not present

## 2018-03-18 DIAGNOSIS — Z87891 Personal history of nicotine dependence: Secondary | ICD-10-CM | POA: Diagnosis not present

## 2018-03-18 DIAGNOSIS — E669 Obesity, unspecified: Secondary | ICD-10-CM | POA: Insufficient documentation

## 2018-03-18 DIAGNOSIS — Z6834 Body mass index (BMI) 34.0-34.9, adult: Secondary | ICD-10-CM | POA: Diagnosis not present

## 2018-03-18 HISTORY — PX: CARDIOVERSION: EP1203

## 2018-03-18 SURGERY — CARDIOVERSION (CATH LAB)
Anesthesia: General

## 2018-03-18 MED ORDER — PROPOFOL 10 MG/ML IV BOLUS
INTRAVENOUS | Status: DC | PRN
  Administered 2018-03-18: 80 mg via INTRAVENOUS

## 2018-03-18 MED ORDER — PROPOFOL 10 MG/ML IV BOLUS
INTRAVENOUS | Status: AC
  Filled 2018-03-18: qty 20

## 2018-03-18 MED ORDER — SODIUM CHLORIDE 0.9 % IV SOLN
INTRAVENOUS | Status: DC
  Administered 2018-03-18: 07:00:00 via INTRAVENOUS

## 2018-03-18 MED ORDER — PHENYLEPHRINE HCL 10 MG/ML IJ SOLN
INTRAMUSCULAR | Status: AC
  Filled 2018-03-18: qty 1

## 2018-03-18 NOTE — Transfer of Care (Signed)
Immediate Anesthesia Transfer of Care Note  Patient: James [name]  Procedure(s) Performed: CARDIOVERSION (N/A )  Patient Location: Nursing Unit  Anesthesia Type:General  Level of Consciousness: awake, alert  and oriented  Airway & Oxygen Therapy: Patient Spontanous Breathing and Patient connected to nasal cannula oxygen  Post-op Assessment: Report given to RN and Post -op Vital signs reviewed and stable  Post vital signs: Reviewed and stable  Last Vitals:  Vitals Value Taken Time  BP 130/96 03/18/2018  7:45 AM  Temp    Pulse 73 03/18/2018  7:45 AM  Resp 20 03/18/2018  7:45 AM  SpO2 99 % 03/18/2018  7:45 AM    Last Pain:  Vitals:   03/18/18 0649  PainSc: 0-No pain         Complications: No apparent anesthesia complications

## 2018-03-18 NOTE — CV Procedure (Signed)
    Cardioversion Note  James [name] [id number] 1946/04/19  Procedure: DC Cardioversion Indications: Persistent atrial fibrillation  Procedure Details Consent: Obtained Time Out: Verified patient identification, verified procedure, site/side was marked, verified correct patient position, special equipment/implants available, Radiology Safety Procedures followed,  medications/allergies/relevent history reviewed, required imaging and test results available.  Performed  The patient has been on adequate anticoagulation with apixaban.  The patient received IV propofol for sedation by anesthesia services.  Synchronous cardioversion was performed at 200 joules x 1.  The cardioversion was successful with restoration of normal sinus rhythm.    Complications: No apparent complications Patient did tolerate procedure well.   Nelva Bush., MD 03/18/2018, 7:40 AM

## 2018-03-18 NOTE — Anesthesia Post-op Follow-up Note (Signed)
Anesthesia QCDR form completed.        

## 2018-03-18 NOTE — Interval H&P Note (Signed)
History and Physical Interval Note:  03/18/2018 7:10 AM  James [name]  has presented today for cardioversion, with the diagnosis of persistent atrial fibrillation. The various methods of treatment have been discussed with the patient and family. After consideration of risks, benefits and other options for treatment, the patient has consented to  Procedure(s): CARDIOVERSION (N/A) as a surgical intervention .  The patient's history has been reviewed, patient examined, no change in status, stable for surgery.  I have reviewed the patient's chart and labs.  Questions were answered to the patient's satisfaction.     [name]

## 2018-03-18 NOTE — Anesthesia Preprocedure Evaluation (Signed)
Anesthesia Evaluation  Patient identified by MRN, date of birth, ID band Patient awake    Reviewed: Allergy & Precautions, H&P , NPO status , Patient's Chart, lab work & pertinent test results, reviewed documented beta blocker date and time   History of Anesthesia Complications Negative for: history of anesthetic complications  Airway Mallampati: I  TM Distance: >3 FB Neck ROM: full    Dental  (+) Dental Advidsory Given, Missing, Caps, Teeth Intact   Pulmonary neg pulmonary ROS, former smoker,           Cardiovascular Exercise Tolerance: Good hypertension, (-) angina(-) CAD, (-) Past MI, (-) Cardiac Stents and (-) CABG + dysrhythmias Atrial Fibrillation + Valvular Problems/Murmurs      Neuro/Psych negative neurological ROS  negative psych ROS   GI/Hepatic negative GI ROS, Neg liver ROS,   Endo/Other  neg diabetesHypothyroidism   Renal/GU negative Renal ROS  negative genitourinary   Musculoskeletal   Abdominal   Peds  Hematology negative hematology ROS (+)   Anesthesia Other Findings Past Medical History: 12/12/2015: Acute lower GI bleeding after colonoscopy and polypectomy,  resolved No date: Atrial fibrillation (HCC) No date: Cardiac murmur No date: Cataract     Comment:  bilateral surgery to remove No date: Colon polyp No date: Constipation No date: Hemorrhoids No date: Hepatitis     Comment:  self resolved, likely food exposure, 1974.  12/05/2015: Hx of adenomatous colonic polyps No date: Hyperlipidemia No date: Hypertension No date: Hypothyroidism No date: Skin cancer     Comment:  basal cell, R ear, MOHS   Reproductive/Obstetrics negative OB ROS                             Anesthesia Physical Anesthesia Plan  ASA: II  Anesthesia Plan: General   Post-op Pain Management:    Induction: Intravenous  PONV Risk Score and Plan: 2 and Propofol infusion and TIVA  Airway  Management Planned: Nasal Cannula  Additional Equipment:   Intra-op Plan:   Post-operative Plan:   Informed Consent: I have reviewed the patients History and Physical, chart, labs and discussed the procedure including the risks, benefits and alternatives for the proposed anesthesia with the patient or authorized representative who has indicated his/her understanding and acceptance.   Dental Advisory Given  Plan Discussed with: Anesthesiologist, CRNA and Surgeon  Anesthesia Plan Comments:         Anesthesia Quick Evaluation

## 2018-03-19 ENCOUNTER — Inpatient Hospital Stay (INDEPENDENT_AMBULATORY_CARE_PROVIDER_SITE_OTHER): Payer: Medicare Other | Admitting: Orthopedic Surgery

## 2018-03-19 NOTE — Anesthesia Postprocedure Evaluation (Signed)
Anesthesia Post Note  Patient: James [name]  Procedure(s) Performed: CARDIOVERSION (N/A )  Patient location during evaluation: Cath Lab Anesthesia Type: General Level of consciousness: awake and alert Pain management: pain level controlled Vital Signs Assessment: post-procedure vital signs reviewed and stable Respiratory status: spontaneous breathing, nonlabored ventilation, respiratory function stable and patient connected to nasal cannula oxygen Cardiovascular status: blood pressure returned to baseline and stable Postop Assessment: no apparent nausea or vomiting Anesthetic complications: no     Last Vitals:  Vitals:   03/18/18 0800 03/18/18 0815  BP: 134/89 (!) 141/95  Pulse: 76 74  Resp: 20 15  Temp:    SpO2:  97%    Last Pain:  Vitals:   03/18/18 0815  PainSc: 0-No pain                 Martha Clan

## 2018-04-03 ENCOUNTER — Ambulatory Visit: Payer: Medicare Other | Admitting: Nurse Practitioner

## 2018-04-03 ENCOUNTER — Encounter: Payer: Self-pay | Admitting: Nurse Practitioner

## 2018-04-03 VITALS — BP 140/72 | HR 102 | Ht 73.0 in | Wt 267.2 lb

## 2018-04-03 DIAGNOSIS — E039 Hypothyroidism, unspecified: Secondary | ICD-10-CM

## 2018-04-03 DIAGNOSIS — I10 Essential (primary) hypertension: Secondary | ICD-10-CM | POA: Diagnosis not present

## 2018-04-03 DIAGNOSIS — I481 Persistent atrial fibrillation: Secondary | ICD-10-CM | POA: Diagnosis not present

## 2018-04-03 DIAGNOSIS — E782 Mixed hyperlipidemia: Secondary | ICD-10-CM

## 2018-04-03 DIAGNOSIS — I4819 Other persistent atrial fibrillation: Secondary | ICD-10-CM

## 2018-04-03 MED ORDER — METOPROLOL SUCCINATE ER 50 MG PO TB24: 50.0000 mg | ORAL_TABLET | Freq: Every day | ORAL | 3 refills | Status: DC

## 2018-04-03 NOTE — Progress Notes (Signed)
Office Visit    Patient Name: James [name] Date of Encounter: 04/03/2018  Primary Care Provider:  Tonia Ghent, MD Primary Cardiologist:  Nelva Bush, MD  Chief Complaint    72 y/o ? with a history of hypertension, hyperlipidemia, osteoarthritis, hypothyroidism, and recent diagnosis of atrial fibrillation who presents for follow-up after recent cardioversion.  Past Medical History    Past Medical History:  Diagnosis Date  . Acute lower GI bleeding after colonoscopy and polypectomy, resolved 12/12/2015  . Cardiac murmur    a. 02/2018 Echo: EF 60-65%, no rwma, mild AI, mildly dil Ao root/Asc Ao/Arch, Mild MR, mildly dil LA. Nl RV fxn.  . Cataract    bilateral surgery to remove  . Colon polyp   . Constipation   . Dilated aortic root ([place])    a. 02/2018 Echo: mildly dil Ao root/asc ao/arch.  . Essential hypertension   . Hemorrhoids   . Hepatitis    self resolved, likely food exposure, 1974.   Marland Kitchen History of cardiovascular stress test    a. 02/2018 Myoview: EF 55-65%, small/mild apical defect w/ nl wall motion ->attenuation artifact. No ischemia. Low risk study.  Marland Kitchen Hx of adenomatous colonic polyps 12/05/2015  . Hyperlipidemia   . Hypothyroidism   . Osteoarthritis   . Persistent atrial fibrillation ([place])    a. Dx 02/2018. CHA2DS2VASc = 2-->Eliquis; b. 03/2018 DCCV (200J).  . Skin cancer    basal cell, R ear, MOHS   Past Surgical History:  Procedure Laterality Date  . BROW LIFT Bilateral 10/23/2016   Procedure: BLEPHAROPLASTY upper eyelid with excess skin;  Surgeon: Karle Starch, MD;  Location: [facility];  Service: Ophthalmology;  Laterality: Bilateral;  MAC  . CARDIOVERSION N/A 03/18/2018   Procedure: CARDIOVERSION;  Surgeon: Nelva Bush, MD;  Location: [facility];  Service: Cardiovascular;  Laterality: N/A;  . CATARACT EXTRACTION  1986   OD  . CATARACT EXTRACTION Bilateral 1995   x 2 for right and left   . COLONOSCOPY    . ECTROPION REPAIR Bilateral  10/23/2016   Procedure: REPAIR OF ECTROPION sutures, extensive;  Surgeon: Karle Starch, MD;  Location: [facility];  Service: Ophthalmology;  Laterality: Bilateral;  . LIPOMA EXCISION  06/1997   left neck ([place])  . MOHS SURGERY Right 2013   behind right ear  . TONSILLECTOMY    . VASECTOMY  1982    Allergies  Allergies  Allergen Reactions  . Sulfonamide Derivatives Rash    History of Present Illness    72 year old male with the above past medical history including hypertension, hyperlipidemia, hypothyroidism, obesity, and osteoarthritis pending left total knee arthroplasty.  In early July, he was evaluated by primary care for preoperative evaluation and was noted to be in rate controlled atrial fibrillation.  He was placed on Eliquis and referred to cardiology.  An echocardiogram was performed and showed normal LV function and a mildly dilated left atrium.  He was seen by Dr. Saunders Revel on July 17 and reported some dyspnea on exertion.  Stress testing was undertaken and was low risk.  On August 13, following 4 weeks of consistent oral anticoagulation, he underwent successful cardioversion.  Following cardioversion, he says he felt improved exercise tolerance for a few days but then again noted mild dyspnea on exertion and fatigue.  He is back in atrial fibrillation today however, he was not aware of this.  He has not any palpitations, chest pain, PND, orthopnea, dizziness, syncope, or early satiety.  He does have mild lower extremity swelling.  Home Medications    Prior to Admission medications   Medication Sig Start Date End Date Taking? Authorizing Provider  apixaban (ELIQUIS) 5 MG TABS tablet Take 1 tablet (5 mg total) by mouth 2 (two) times daily. 02/11/18   Tonia Ghent, MD  diclofenac sodium (VOLTAREN) 1 % GEL Apply 4 g topically 4 (four) times daily as needed. 08/05/17   Tonia Ghent, MD  doxazosin (CARDURA) 4 MG tablet Take 1 tablet (4 mg total) by mouth daily. 08/05/17    Tonia Ghent, MD  finasteride (PROSCAR) 5 MG tablet Take 1 tablet (5 mg total) by mouth daily. 05/09/17   Nickie Retort, MD  levothyroxine (SYNTHROID, LEVOTHROID) 150 MCG tablet Take 1 tablet a day except for 1/2 tablet on Sundays.  Total of 6.5 tablets/week. 02/12/18   Tonia Ghent, MD  metoprolol succinate (TOPROL XL) 25 MG 24 hr tablet Take 1 tablet (25 mg total) by mouth daily. 08/05/17 08/09/18  Tonia Ghent, MD  Multiple Vitamin (MULTIVITAMIN) tablet Take 1 tablet by mouth daily.      [provider]  Omega-3 Fatty Acids (FISH OIL) 1000 MG CAPS Take 100 mg by mouth daily.     [provider]  psyllium (METAMUCIL) 58.6 % powder Take 1 packet by mouth daily.     [provider]  ramipril (ALTACE) 10 MG capsule Take 1 capsule (10 mg total) by mouth daily. 08/05/17   Tonia Ghent, MD  simvastatin (ZOCOR) 40 MG tablet Take 1 tablet (40 mg total) by mouth at bedtime. 08/05/17   Tonia Ghent, MD    Review of Systems    As above, he did note initial improvement in exercise tolerance following cardioversion but has since had mild dyspnea and fatigue.  He denies palpitations, chest pain, PND, orthopnea, dizziness, syncope, or early satiety.  He is accustomed to having mild lower extremity swelling.  All other systems reviewed and are otherwise negative except as noted above.  Physical Exam    VS:  BP 140/72 (BP Location: Left Arm, Patient Position: Sitting, Cuff Size: Large)   Pulse (!) 102   Ht _0  (1.854 m)   Wt 267 lb 4 oz (121.2 kg)   BMI 35.26 kg/m  , BMI Body mass index is 35.26 kg/m. GEN: Obese, in no acute distress.  HEENT: normal.  Neck: Supple, no JVD, carotid bruits, or masses. Cardiac: Irregularly irregular, no murmurs, rubs, or gallops. No clubbing, cyanosis, trace bilateral ankle edema.  Radials/DP/PT 2+ and equal bilaterally.  Respiratory:  Respirations regular and unlabored, clear to auscultation bilaterally. GI: Obese, soft,  nontender, nondistended, BS + x 4. MS: no deformity or atrophy. Skin: warm and dry, no rash. Neuro:  Strength and sensation are intact. Psych: Normal affect.  Accessory Clinical Findings    ECG -atrial fibrillation, 102, left axis deviation, left anterior fascicular block, prior inferior infarct.  Assessment & Plan    1.  Persistent atrial fibrillation: Patient was diagnosed with atrial fibrillation of unknown duration in July and subsequently placed on Eliquis.  He underwent cardioversion in early August which was initially successful with improvement in exercise tolerance but he says a few days after cardioversion he noted recurrent mild dyspnea and fatigue.  He is back in atrial fibrillation today at a rate of 102 bpm.  He has been compliant with Eliquis.  We discussed future management of symptomatic atrial fibrillation and the role of antiarrhythmics  with potential for repeat cardioversion.  Patient is interested in referral to atrial fibrillation clinic in [facility] for consideration of antiarrhythmic therapy versus ablative options.  In the absence of any prior history of heart failure or coronary disease, it would appear that we have several options.  I am going to increase his Toprol-XL to 50 mg daily.  I will also arrange for sleep evaluation referral.  2.  Dyspnea on exertion: Previously evaluated with echocardiogram which showed normal LV function and followed by stress testing, which was nonischemic.  Patient had some improvement in his exercise tolerance following cardioversion though this only lasted a few days.  It would appear that although deconditioning may be playing some role in his exercise intolerance, atrial fibrillation very clearly plays a role, thus making maintenance of sinus rhythm that much more important.  3.  Essential hypertension: Blood pressure elevated today at 140/72.  In the setting of elevated heart rate at 102, I am increasing metoprolol to 50 mg daily.  4.   Hyperlipidemia: LDL was 73 in December 2018.  He remains on statin therapy.  5.  Hypothyroidism with iatrogenic hyperthyroidism: TSH was 0.09 in early July.  His Synthroid dose was reduced by primary care with plan for follow-up in September.  6.  Morbid obesity/snoring: We will arrange for sleep evaluation.  7.  Preoperative cardiovascular evaluation/osteoarthritis: Patient still pending left total knee arthroplasty.  As above, we continue to work on rhythm management.  He remains on oral anticoagulation.  As previously noted, he will need to remain on oral anticoagulation for at least 4 weeks following conversion to sinus rhythm in the future.  In that setting, surgery is on hold.  As he has recently had a stress test which was low risk, he should not require additional ischemic testing prior to surgery in the future.  8.  Disposition: Follow-up in atrial fibrillation clinic within the next week or so.  Follow-up here in 1 month or sooner if necessary.  Murray Hodgkins, NP 04/03/2018, 1:34 PM

## 2018-04-03 NOTE — Patient Instructions (Signed)
Medication Instructions:  Your physician has recommended you make the following change in your medication:  1- INCREASE Metoprolol to 50 mg by mouth once a day.   Labwork: none  Testing/Procedures: none  Follow-Up: You have been referred to [facility] Fib Clinic in [place].  You have been referred to Pulmonology for sleep study evaluation.  Your physician recommends that you schedule a follow-up appointment in: [place].    If you need a refill on your cardiac medications before your next appointment, please call your pharmacy.

## 2018-04-14 ENCOUNTER — Encounter: Payer: Self-pay | Admitting: Internal Medicine

## 2018-04-14 ENCOUNTER — Ambulatory Visit (INDEPENDENT_AMBULATORY_CARE_PROVIDER_SITE_OTHER): Payer: Medicare Other | Admitting: Internal Medicine

## 2018-04-14 VITALS — BP 130/86 | HR 83 | Resp 16 | Ht 73.0 in | Wt 268.0 lb

## 2018-04-14 DIAGNOSIS — G4719 Other hypersomnia: Secondary | ICD-10-CM | POA: Diagnosis not present

## 2018-04-14 NOTE — Progress Notes (Signed)
[facility] Consultation      Assessment and Plan:  Excessive daytime sleepiness. - Symptoms and signs of obstructive sleep apnea. - We will send for sleep study.  Orders Placed This Encounter  Procedures  . Home sleep test   Return in about 3 months (around 07/14/2018).    Date: 04/14/2018  MRN# [id number] James [name] [birth year]   James [name] is a 72 y.o. old male seen in consultation for chief complaint of:    Chief Complaint  Patient presents with  . Consult    referred by Dr. Saunders Revel for eval of OSA:  . Shortness of Breath    on exertion    HPI:   The patient is a 72 year old male, presents with symptoms of excessive daytime sleepiness.  He finds that he wakes up at about 3 AM, he usually goes to bed between 10 and 11 PM.  Wakes up to get out of bed at 6 or 7 AM. He has Afib, seeing Dr. Saunders Revel, this is a new diagnosis found as preparation for a knee surgery.  No sleep paralysis, sleep walking, no cataplexy.  Denies jaw pain, or TMJ.   PMHX:   Past Medical History:  Diagnosis Date  . Acute lower GI bleeding after colonoscopy and polypectomy, resolved 12/12/2015  . Cardiac murmur    a. 02/2018 Echo: EF 60-65%, no rwma, mild AI, mildly dil Ao root/Asc Ao/Arch, Mild MR, mildly dil LA. Nl RV fxn.  . Cataract    bilateral surgery to remove  . Colon polyp   . Constipation   . Dilated aortic root ([place])    a. 02/2018 Echo: mildly dil Ao root/asc ao/arch.  . Essential hypertension   . Hemorrhoids   . Hepatitis    self resolved, likely food exposure, 1974.   Marland Kitchen History of cardiovascular stress test    a. 02/2018 Myoview: EF 55-65%, small/mild apical defect w/ nl wall motion ->attenuation artifact. No ischemia. Low risk study.  Marland Kitchen Hx of adenomatous colonic polyps 12/05/2015  . Hyperlipidemia   . Hypothyroidism   . Osteoarthritis   . Persistent atrial fibrillation ([place])    a. Dx 02/2018. CHA2DS2VASc = 2-->Eliquis; b. 03/2018 DCCV (200J).  . Skin cancer    basal cell, R ear, MOHS   Surgical Hx:  Past Surgical History:  Procedure Laterality Date  . BROW LIFT Bilateral 10/23/2016   Procedure: BLEPHAROPLASTY upper eyelid with excess skin;  Surgeon: Karle Starch, MD;  Location: [place];  Service: Ophthalmology;  Laterality: Bilateral;  MAC  . CARDIOVERSION N/A 03/18/2018   Procedure: CARDIOVERSION;  Surgeon: Nelva Bush, MD;  Location: [facility];  Service: Cardiovascular;  Laterality: N/A;  . CATARACT EXTRACTION  1986   OD  . CATARACT EXTRACTION Bilateral 1995   x 2 for right and left   . COLONOSCOPY    . ECTROPION REPAIR Bilateral 10/23/2016   Procedure: REPAIR OF ECTROPION sutures, extensive;  Surgeon: Karle Starch, MD;  Location: [place];  Service: Ophthalmology;  Laterality: Bilateral;  . LIPOMA EXCISION  06/1997   left neck ([place])  . MOHS SURGERY Right 2013   behind right ear  . TONSILLECTOMY    . VASECTOMY  1982   Family Hx:  Family History  Problem Relation Age of Onset  . Heart disease Paternal Grandfather        MI, old age  . Stroke Mother   . Lung cancer Maternal Grandfather  smoker  . Stroke Maternal Grandfather   . Colon cancer Neg Hx   . Colon polyps Neg Hx   . Esophageal cancer Neg Hx   . Rectal cancer Neg Hx   . Stomach cancer Neg Hx   . Bladder Cancer Neg Hx   . Prostate cancer Neg Hx    Social Hx:   Social History   Tobacco Use  . Smoking status: Former Smoker    Packs/day: 2.00    Years: 20.00    Pack years: 40.00    Types: Cigarettes    Last attempt to quit: 08/07/1983    Years since quitting: 34.7  . Smokeless tobacco: Never Used  . Tobacco comment: quit over 30 years ago  Substance Use Topics  . Alcohol use: Yes    Alcohol/week: 7.0 standard drinks    Types: 7 Shots of liquor per week    Comment: a drink every evening, scotch or whiskey  . Drug use: No   Medication:    Current Outpatient Medications:  .  apixaban (ELIQUIS) 5 MG TABS tablet, Take 1 tablet (5  mg total) by mouth 2 (two) times daily., Disp: 60 tablet, Rfl: 3 .  diclofenac sodium (VOLTAREN) 1 % GEL, Apply 4 g topically 4 (four) times daily as needed., Disp: 300 g, Rfl: 3 .  doxazosin (CARDURA) 4 MG tablet, Take 1 tablet (4 mg total) by mouth daily., Disp: 90 tablet, Rfl: 3 .  finasteride (PROSCAR) 5 MG tablet, Take 1 tablet (5 mg total) by mouth daily., Disp: 90 tablet, Rfl: 3 .  levothyroxine (SYNTHROID, LEVOTHROID) 150 MCG tablet, Take 1 tablet a day except for 1/2 tablet on Sundays.  Total of 6.5 tablets/week., Disp: , Rfl:  .  metoprolol succinate (TOPROL-XL) 50 MG 24 hr tablet, Take 1 tablet (50 mg total) by mouth daily. Take with or immediately following a meal., Disp: 90 tablet, Rfl: 3 .  Multiple Vitamin (MULTIVITAMIN) tablet, Take 1 tablet by mouth daily.  , Disp: , Rfl:  .  Omega-3 Fatty Acids (FISH OIL) 1000 MG CAPS, Take 100 mg by mouth daily. , Disp: , Rfl:  .  psyllium (METAMUCIL) 58.6 % powder, Take 1 packet by mouth daily. , Disp: , Rfl:  .  ramipril (ALTACE) 10 MG capsule, Take 1 capsule (10 mg total) by mouth daily., Disp: 90 capsule, Rfl: 3 .  simvastatin (ZOCOR) 40 MG tablet, Take 1 tablet (40 mg total) by mouth at bedtime., Disp: 90 tablet, Rfl: 3   Allergies:  Sulfonamide derivatives  Review of Systems: Gen:  Denies  fever, sweats, chills HEENT: Denies blurred vision, double vision. bleeds, sore throat Cvc:  No dizziness, chest pain. Resp:   Denies cough or sputum production, shortness of breath Gi: Denies swallowing difficulty, stomach pain. Gu:  Denies bladder incontinence, burning urine Ext:   No Joint pain, stiffness. Skin: No skin rash,  hives  Endoc:  No polyuria, polydipsia. Psych: No depression, insomnia. Other:  All other systems were reviewed with the patient and were negative other that what is mentioned in the HPI.   Physical Examination:   VS: BP 130/86 (BP Location: Left Arm, Cuff Size: Large)   Pulse 83   Resp 16   Ht _0  (1.854 m)    Wt 268 lb (121.6 kg)   SpO2 100%   BMI 35.36 kg/m   General Appearance: No distress  Neuro:without focal findings,  speech normal,  HEENT: PERRLA, EOM intact.   Pulmonary: normal breath sounds, No  wheezing.  CardiovascularNormal S1,S2.  No m/r/g.   Abdomen: Benign, Soft, non-tender. Renal:  No costovertebral tenderness  GU:  No performed at this time. Endoc: No evident thyromegaly, no signs of acromegaly. Skin:   warm, no rashes, no ecchymosis  Extremities: normal, no cyanosis, clubbing.  Other findings:    LABORATORY PANEL:   CBC No results for input(s): WBC, HGB, HCT, PLT in the last 168 hours. ------------------------------------------------------------------------------------------------------------------  Chemistries  No results for input(s): NA, K, CL, CO2, GLUCOSE, BUN, CREATININE, CALCIUM, MG, AST, ALT, ALKPHOS, BILITOT in the last 168 hours.  Invalid input(s): GFRCGP ------------------------------------------------------------------------------------------------------------------  Cardiac Enzymes No results for input(s): TROPONINI in the last 168 hours. ------------------------------------------------------------  RADIOLOGY:  No results found.     Thank  you for the consultation and for allowing [facility] to assist in the care of your patient. Our recommendations are noted above.  Please contact us if we can be of further service.   Marda Stalker, M.D., F.C.C.P.  Board Certified in Internal Medicine, Pulmonary Medicine, [place], and Sleep Medicine.  [facility] Office Number: [phone]   04/14/2018

## 2018-04-14 NOTE — Patient Instructions (Signed)

## 2018-04-15 ENCOUNTER — Other Ambulatory Visit (INDEPENDENT_AMBULATORY_CARE_PROVIDER_SITE_OTHER): Payer: Medicare Other

## 2018-04-15 DIAGNOSIS — E039 Hypothyroidism, unspecified: Secondary | ICD-10-CM

## 2018-04-15 LAB — TSH: TSH: 0.6 u[IU]/mL (ref 0.35–4.50)

## 2018-04-17 ENCOUNTER — Ambulatory Visit (HOSPITAL_COMMUNITY)
Admission: RE | Admit: 2018-04-17 | Discharge: 2018-04-17 | Disposition: A | Discharge status: Home / Self Care | Payer: Medicare Other | Source: Ambulatory Visit | Attending: Nurse Practitioner | Admitting: Nurse Practitioner

## 2018-04-17 ENCOUNTER — Encounter (HOSPITAL_COMMUNITY): Payer: Self-pay | Admitting: Nurse Practitioner

## 2018-04-17 VITALS — BP 136/78 | HR 64 | Ht 73.0 in | Wt 268.0 lb

## 2018-04-17 DIAGNOSIS — I10 Essential (primary) hypertension: Secondary | ICD-10-CM | POA: Diagnosis not present

## 2018-04-17 DIAGNOSIS — Z7901 Long term (current) use of anticoagulants: Secondary | ICD-10-CM | POA: Insufficient documentation

## 2018-04-17 DIAGNOSIS — Z87891 Personal history of nicotine dependence: Secondary | ICD-10-CM | POA: Diagnosis not present

## 2018-04-17 DIAGNOSIS — E785 Hyperlipidemia, unspecified: Secondary | ICD-10-CM | POA: Diagnosis not present

## 2018-04-17 DIAGNOSIS — E039 Hypothyroidism, unspecified: Secondary | ICD-10-CM | POA: Diagnosis not present

## 2018-04-17 DIAGNOSIS — Z823 Family history of stroke: Secondary | ICD-10-CM | POA: Diagnosis not present

## 2018-04-17 DIAGNOSIS — Z9842 Cataract extraction status, left eye: Secondary | ICD-10-CM | POA: Diagnosis not present

## 2018-04-17 DIAGNOSIS — Z79890 Hormone replacement therapy (postmenopausal): Secondary | ICD-10-CM | POA: Diagnosis not present

## 2018-04-17 DIAGNOSIS — Z85828 Personal history of other malignant neoplasm of skin: Secondary | ICD-10-CM | POA: Diagnosis not present

## 2018-04-17 DIAGNOSIS — I4819 Other persistent atrial fibrillation: Secondary | ICD-10-CM

## 2018-04-17 DIAGNOSIS — Z9841 Cataract extraction status, right eye: Secondary | ICD-10-CM | POA: Diagnosis not present

## 2018-04-17 DIAGNOSIS — I481 Persistent atrial fibrillation: Secondary | ICD-10-CM | POA: Diagnosis not present

## 2018-04-17 DIAGNOSIS — Z86010 Personal history of colonic polyps: Secondary | ICD-10-CM | POA: Diagnosis not present

## 2018-04-17 DIAGNOSIS — Z882 Allergy status to sulfonamides status: Secondary | ICD-10-CM | POA: Insufficient documentation

## 2018-04-17 DIAGNOSIS — M1990 Unspecified osteoarthritis, unspecified site: Secondary | ICD-10-CM | POA: Diagnosis not present

## 2018-04-17 DIAGNOSIS — Z79899 Other long term (current) drug therapy: Secondary | ICD-10-CM | POA: Diagnosis not present

## 2018-04-17 MED ORDER — FLECAINIDE ACETATE 50 MG PO TABS: 50.0000 mg | ORAL_TABLET | Freq: Two times a day (BID) | ORAL | 3 refills | Status: DC

## 2018-04-17 NOTE — Patient Instructions (Addendum)
Start Flecainide 50mg  twice a day on Monday morning

## 2018-04-18 NOTE — Progress Notes (Signed)
 Primary Care Physician: Duncan, Graham S, MD Referring Physician: Chris Berge, NP/Dr. End   James [name] is a 71 y.o. male with a h/o hypertension, hyperlipidemia, osteoarthritis, hypothyroidism, and recent diagnosis of atrial fibrillation who presents to the afib clinic for persistent afib.   In early July, he was evaluated by primary care for preoperative evaluation and was noted to be in rate controlled atrial fibrillation.  He was placed on Eliquis and referred to cardiology.  An echocardiogram was performed and showed normal LV function and a mildly dilated left atrium.  He was seen by Dr. End on July 17 and reported some dyspnea on exertion.  Stress testing was undertaken and was low risk.  On August 13, following 4 weeks of consistent oral anticoagulation, he underwent successful cardioversion.  Following cardioversion, he says he felt improved exercise tolerance for a few days but then again noted mild dyspnea on exertion and fatigue.  He is back in atrial fibrillation today however, he was not aware of this  Today, he denies symptoms of palpitations, chest pain, shortness of breath, orthopnea, PND, lower extremity edema, dizziness, presyncope, syncope, or neurologic sequela. The patient is tolerating medications without difficulties and is otherwise without complaint today.   Past Medical History:  Diagnosis Date  . Acute lower GI bleeding after colonoscopy and polypectomy, resolved 12/12/2015  . Cardiac murmur    a. 02/2018 Echo: EF 60-65%, no rwma, mild AI, mildly dil Ao root/Asc Ao/Arch, Mild MR, mildly dil LA. Nl RV fxn.  . Cataract    bilateral surgery to remove  . Colon polyp   . Constipation   . Dilated aortic root (HCC)    a. 02/2018 Echo: mildly dil Ao root/asc ao/arch.  . Essential hypertension   . Hemorrhoids   . Hepatitis    self resolved, likely food exposure, 1974.   . History of cardiovascular stress test    a. 02/2018 Myoview: EF 55-65%, small/mild apical defect  w/ nl wall motion ->attenuation artifact. No ischemia. Low risk study.  . Hx of adenomatous colonic polyps 12/05/2015  . Hyperlipidemia   . Hypothyroidism   . Osteoarthritis   . Persistent atrial fibrillation (HCC)    a. Dx 02/2018. CHA2DS2VASc = 2-->Eliquis; b. 03/2018 DCCV (200J).  . Skin cancer    basal cell, R ear, MOHS   Past Surgical History:  Procedure Laterality Date  . BROW LIFT Bilateral 10/23/2016   Procedure: BLEPHAROPLASTY upper eyelid with excess skin;  Surgeon: Amy M Fowler, MD;  Location: [facility];  Service: Ophthalmology;  Laterality: Bilateral;  MAC  . CARDIOVERSION N/A 03/18/2018   Procedure: CARDIOVERSION;  Surgeon: End, Christopher, MD;  Location: [facility];  Service: Cardiovascular;  Laterality: N/A;  . CATARACT EXTRACTION  1986   OD  . CATARACT EXTRACTION Bilateral 1995   x 2 for right and left   . COLONOSCOPY    . ECTROPION REPAIR Bilateral 10/23/2016   Procedure: REPAIR OF ECTROPION sutures, extensive;  Surgeon: Amy M Fowler, MD;  Location: [facility];  Service: Ophthalmology;  Laterality: Bilateral;  . LIPOMA EXCISION  06/1997   left neck (Juengel)  . MOHS SURGERY Right 2013   behind right ear  . TONSILLECTOMY    . VASECTOMY  1982    Current Outpatient Medications  Medication Sig Dispense Refill  . apixaban (ELIQUIS) 5 MG TABS tablet Take 1 tablet (5 mg total) by mouth 2 (two) times daily. 60 tablet 3  . diclofenac sodium (VOLTAREN) 1 % GEL   Apply 4 g topically 4 (four) times daily as needed. 300 g 3  . doxazosin (CARDURA) 4 MG tablet Take 1 tablet (4 mg total) by mouth daily. 90 tablet 3  . finasteride (PROSCAR) 5 MG tablet Take 1 tablet (5 mg total) by mouth daily. 90 tablet 3  . levothyroxine (SYNTHROID, LEVOTHROID) 150 MCG tablet Take 1 tablet a day except for 1/2 tablet on Sundays.  Total of 6.5 tablets/week.    . metoprolol succinate (TOPROL-XL) 50 MG 24 hr tablet Take 1 tablet (50 mg total) by mouth daily. Take with or immediately  following a meal. 90 tablet 3  . Multiple Vitamin (MULTIVITAMIN) tablet Take 1 tablet by mouth daily.      . Omega-3 Fatty Acids (FISH OIL) 1000 MG CAPS Take 100 mg by mouth daily.     . psyllium (METAMUCIL) 58.6 % powder Take 1 packet by mouth daily.     . ramipril (ALTACE) 10 MG capsule Take 1 capsule (10 mg total) by mouth daily. 90 capsule 3  . simvastatin (ZOCOR) 40 MG tablet Take 1 tablet (40 mg total) by mouth at bedtime. 90 tablet 3  . flecainide (TAMBOCOR) 50 MG tablet Take 1 tablet (50 mg total) by mouth 2 (two) times daily. 60 tablet 3   No current facility-administered medications for this encounter.     Allergies  Allergen Reactions  . Sulfonamide Derivatives Rash    Social History   Socioeconomic History  . Marital status: Married    Spouse name: Remo Lipps  . Number of children: 1  . Years of education: 26  . Highest education level: Associate degree: occupational, Hotel manager, or vocational program  Occupational History  . Occupation: Retired    Fish farm manager: RETIRED    Comment: 1  Social Needs  . Financial resource strain: Not on file  . Food insecurity:    Worry: Not on file    Inability: Not on file  . Transportation needs:    Medical: Not on file    Non-medical: Not on file  Tobacco Use  . Smoking status: Former Smoker    Packs/day: 2.00    Years: 20.00    Pack years: 40.00    Types: Cigarettes    Last attempt to quit: 08/07/1983    Years since quitting: 34.7  . Smokeless tobacco: Never Used  . Tobacco comment: quit over 30 years ago  Substance and Sexual Activity  . Alcohol use: Yes    Alcohol/week: 7.0 standard drinks    Types: 7 Shots of liquor per week    Comment: a drink every evening, scotch or whiskey  . Drug use: No  . Sexual activity: Not on file  Lifestyle  . Physical activity:    Days per week: Not on file    Minutes per session: Not on file  . Stress: Not on file  Relationships  . Social connections:    Talks on phone: Not on file    Gets  together: Not on file    Attends religious service: Not on file    Active member of club or organization: Not on file    Attends meetings of clubs or organizations: Not on file    Relationship status: Not on file  . Intimate partner violence:    Fear of current or ex partner: Not on file    Emotionally abused: Not on file    Physically abused: Not on file    Forced sexual activity: Not on file  Other Topics Concern  .  Not on file  Social History Narrative   Married and lives with wife 1974   One daughter, not local   Retired from [employer], sea based telecommunication/contracting    Family History  Problem Relation Age of Onset  . Heart disease Paternal Grandfather        MI, old age  . Stroke Mother   . Lung cancer Maternal Grandfather        smoker  . Stroke Maternal Grandfather   . Colon cancer Neg Hx   . Colon polyps Neg Hx   . Esophageal cancer Neg Hx   . Rectal cancer Neg Hx   . Stomach cancer Neg Hx   . Bladder Cancer Neg Hx   . Prostate cancer Neg Hx     ROS- All systems are reviewed and negative except as per the HPI above  Physical Exam: Vitals:   04/17/18 1413  BP: 136/78  Pulse: 64  Weight: 121.6 kg  Height: 6' 1" (1.854 m)   Wt Readings from Last 3 Encounters:  04/17/18 121.6 kg  04/14/18 121.6 kg  04/03/18 121.2 kg    Labs: Lab Results  Component Value Date   NA 143 03/14/2018   K 4.2 03/14/2018   CL 108 03/14/2018   CO2 28 03/14/2018   GLUCOSE 103 (H) 03/14/2018   BUN 16 03/14/2018   CREATININE 1.38 (H) 03/14/2018   CALCIUM 9.0 03/14/2018   No results found for: INR Lab Results  Component Value Date   CHOL 140 07/25/2017   HDL 40.50 07/25/2017   LDLCALC 73 07/25/2017   TRIG 132.0 07/25/2017     GEN- The patient is well appearing, alert and oriented x 3 today.   Head- normocephalic, atraumatic Eyes-  Sclera clear, conjunctiva pink Ears- hearing intact Oropharynx- clear Neck- supple, no JVP Lymph- no cervical  lymphadenopathy Lungs- Clear to ausculation bilaterally, normal work of breathing Heart- Regular rate and rhythm, no murmurs, rubs or gallops, PMI not laterally displaced GI- soft, NT, ND, + BS Extremities- no clubbing, cyanosis, or edema MS- no significant deformity or atrophy Skin- no rash or lesion Psych- euthymic mood, full affect Neuro- strength and sensation are intact  EKG-afib at 64 bpm, Qrs int 100 ms, qtc 396 ms Echo-Study Conclusions  - Left ventricle: The cavity size was normal. Wall thickness was   increased in a pattern of mild LVH. Systolic function was normal.   The estimated ejection fraction was in the range of 60% to 65%.   Wall motion was normal; there were no regional wall motion   abnormalities. - Aortic valve: There was probably mild regurgitation, though   evaluation is limited by suboptimal Doppler windows. - Aortic root: The aortic root was mildly dilated. - Ascending aorta: The ascending aorta was mildly dilated. - Aortic arch: The aortic arch was mildly dilated. - Mitral valve: There was mild regurgitation. - Left atrium: The atrium was mildly dilated. - Right ventricle: The cavity size was mildly dilated. Systolic   function was normal.   Stress test-There was no ST segment deviation noted during stress.  No T wave inversion was noted during stress.  Defect 1: There is a small defect of mild severity present in the apex location. This defect is fixed with normal wall motion suggestive of attenuation artifact.  The study is normal.  This is a low risk study.  The left ventricular ejection fraction is normal (55-65%).  Suboptimal study due to significant diaphragm attenuation      Assessment   and Plan: 1. Persistent afib Successful cardioversion but with ERAF Recent echo with normal EF and stress test , low risk  Discussed antiarrythmic's with pt and will try flecainide 50 mg bid He will start Monday Am and have f/u EKG on Wednesday He  will have repeat EKG in the [place] office and faxed to me  If intervals ok and remains in afib, will increase to 100 mg bid and if still in afib schedule for cardioversion This may be able to be done in the [place] area to minimize travel for the pt  2. CHA2DS2VASc score of 2 States no missed doses of  eliquis 5 mg bid  3. HTN Stable  Braxdon Gappa C. Letty Salvi, ANP-C [facility] [address] [place], [place] [address] [phone]  

## 2018-04-18 NOTE — H&P (View-Only) (Signed)
Primary Care Physician: Tonia Ghent, MD Referring Physician: Ignacia Bayley, NP/Dr. End   James [name] is a 72 y.o. male with a h/o hypertension, hyperlipidemia, osteoarthritis, hypothyroidism, and recent diagnosis of atrial fibrillation who presents to the afib clinic for persistent afib.   In early July, he was evaluated by primary care for preoperative evaluation and was noted to be in rate controlled atrial fibrillation.  He was placed on Eliquis and referred to cardiology.  An echocardiogram was performed and showed normal LV function and a mildly dilated left atrium.  He was seen by Dr. Saunders Revel on July 17 and reported some dyspnea on exertion.  Stress testing was undertaken and was low risk.  On August 13, following 4 weeks of consistent oral anticoagulation, he underwent successful cardioversion.  Following cardioversion, he says he felt improved exercise tolerance for a few days but then again noted mild dyspnea on exertion and fatigue.  He is back in atrial fibrillation today however, he was not aware of this  Today, he denies symptoms of palpitations, chest pain, shortness of breath, orthopnea, PND, lower extremity edema, dizziness, presyncope, syncope, or neurologic sequela. The patient is tolerating medications without difficulties and is otherwise without complaint today.   Past Medical History:  Diagnosis Date  . Acute lower GI bleeding after colonoscopy and polypectomy, resolved 12/12/2015  . Cardiac murmur    a. 02/2018 Echo: EF 60-65%, no rwma, mild AI, mildly dil Ao root/Asc Ao/Arch, Mild MR, mildly dil LA. Nl RV fxn.  . Cataract    bilateral surgery to remove  . Colon polyp   . Constipation   . Dilated aortic root ([place])    a. 02/2018 Echo: mildly dil Ao root/asc ao/arch.  . Essential hypertension   . Hemorrhoids   . Hepatitis    self resolved, likely food exposure, 1974.   Marland Kitchen History of cardiovascular stress test    a. 02/2018 Myoview: EF 55-65%, small/mild apical defect  w/ nl wall motion ->attenuation artifact. No ischemia. Low risk study.  Marland Kitchen Hx of adenomatous colonic polyps 12/05/2015  . Hyperlipidemia   . Hypothyroidism   . Osteoarthritis   . Persistent atrial fibrillation ([place])    a. Dx 02/2018. CHA2DS2VASc = 2-->Eliquis; b. 03/2018 DCCV (200J).  . Skin cancer    basal cell, R ear, MOHS   Past Surgical History:  Procedure Laterality Date  . BROW LIFT Bilateral 10/23/2016   Procedure: BLEPHAROPLASTY upper eyelid with excess skin;  Surgeon: Karle Starch, MD;  Location: [place];  Service: Ophthalmology;  Laterality: Bilateral;  MAC  . CARDIOVERSION N/A 03/18/2018   Procedure: CARDIOVERSION;  Surgeon: Nelva Bush, MD;  Location: [facility];  Service: Cardiovascular;  Laterality: N/A;  . CATARACT EXTRACTION  1986   OD  . CATARACT EXTRACTION Bilateral 1995   x 2 for right and left   . COLONOSCOPY    . ECTROPION REPAIR Bilateral 10/23/2016   Procedure: REPAIR OF ECTROPION sutures, extensive;  Surgeon: Karle Starch, MD;  Location: [facility];  Service: Ophthalmology;  Laterality: Bilateral;  . LIPOMA EXCISION  06/1997   left neck ([place]engel)  . MOHS SURGERY Right 2013   behind right ear  . TONSILLECTOMY    . VASECTOMY  1982    Current Outpatient Medications  Medication Sig Dispense Refill  . apixaban (ELIQUIS) 5 MG TABS tablet Take 1 tablet (5 mg total) by mouth 2 (two) times daily. 60 tablet 3  . diclofenac sodium (VOLTAREN) 1 % GEL  Apply 4 g topically 4 (four) times daily as needed. 300 g 3  . doxazosin (CARDURA) 4 MG tablet Take 1 tablet (4 mg total) by mouth daily. 90 tablet 3  . finasteride (PROSCAR) 5 MG tablet Take 1 tablet (5 mg total) by mouth daily. 90 tablet 3  . levothyroxine (SYNTHROID, LEVOTHROID) 150 MCG tablet Take 1 tablet a day except for 1/2 tablet on Sundays.  Total of 6.5 tablets/week.    . metoprolol succinate (TOPROL-XL) 50 MG 24 hr tablet Take 1 tablet (50 mg total) by mouth daily. Take with or immediately  following a meal. 90 tablet 3  . Multiple Vitamin (MULTIVITAMIN) tablet Take 1 tablet by mouth daily.      . Omega-3 Fatty Acids (FISH OIL) 1000 MG CAPS Take 100 mg by mouth daily.     . psyllium (METAMUCIL) 58.6 % powder Take 1 packet by mouth daily.     . ramipril (ALTACE) 10 MG capsule Take 1 capsule (10 mg total) by mouth daily. 90 capsule 3  . simvastatin (ZOCOR) 40 MG tablet Take 1 tablet (40 mg total) by mouth at bedtime. 90 tablet 3  . flecainide (TAMBOCOR) 50 MG tablet Take 1 tablet (50 mg total) by mouth 2 (two) times daily. 60 tablet 3   No current facility-administered medications for this encounter.     Allergies  Allergen Reactions  . Sulfonamide Derivatives Rash    Social History   Socioeconomic History  . Marital status: Married    Spouse name: Remo Lipps  . Number of children: 1  . Years of education: 58  . Highest education level: Associate degree: occupational, Hotel manager, or vocational program  Occupational History  . Occupation: Retired    Fish farm manager: RETIRED    Comment: 1  Social Needs  . Financial resource strain: Not on file  . Food insecurity:    Worry: Not on file    Inability: Not on file  . Transportation needs:    Medical: Not on file    Non-medical: Not on file  Tobacco Use  . Smoking status: Former Smoker    Packs/day: 2.00    Years: 20.00    Pack years: 40.00    Types: Cigarettes    Last attempt to quit: 08/07/1983    Years since quitting: 34.7  . Smokeless tobacco: Never Used  . Tobacco comment: quit over 30 years ago  Substance and Sexual Activity  . Alcohol use: Yes    Alcohol/week: 7.0 standard drinks    Types: 7 Shots of liquor per week    Comment: a drink every evening, scotch or whiskey  . Drug use: No  . Sexual activity: Not on file  Lifestyle  . Physical activity:    Days per week: Not on file    Minutes per session: Not on file  . Stress: Not on file  Relationships  . Social connections:    Talks on phone: Not on file    Gets  together: Not on file    Attends religious service: Not on file    Active member of club or organization: Not on file    Attends meetings of clubs or organizations: Not on file    Relationship status: Not on file  . Intimate partner violence:    Fear of current or ex partner: Not on file    Emotionally abused: Not on file    Physically abused: Not on file    Forced sexual activity: Not on file  Other Topics Concern  .  Not on file  Social History Narrative   Married and lives with wife 1974   One daughter, not local   Retired from [employer], sea based telecommunication/contracting    Family History  Problem Relation Age of Onset  . Heart disease Paternal Grandfather        MI, old age  . Stroke Mother   . Lung cancer Maternal Grandfather        smoker  . Stroke Maternal Grandfather   . Colon cancer Neg Hx   . Colon polyps Neg Hx   . Esophageal cancer Neg Hx   . Rectal cancer Neg Hx   . Stomach cancer Neg Hx   . Bladder Cancer Neg Hx   . Prostate cancer Neg Hx     ROS- All systems are reviewed and negative except as per the HPI above  Physical Exam: Vitals:   04/17/18 1413  BP: 136/78  Pulse: 64  Weight: 121.6 kg  Height: _0  (1.854 m)   Wt Readings from Last 3 Encounters:  04/17/18 121.6 kg  04/14/18 121.6 kg  04/03/18 121.2 kg    Labs: Lab Results  Component Value Date   NA 143 03/14/2018   K 4.2 03/14/2018   CL 108 03/14/2018   CO2 28 03/14/2018   GLUCOSE 103 (H) 03/14/2018   BUN 16 03/14/2018   CREATININE 1.38 (H) 03/14/2018   CALCIUM 9.0 03/14/2018   No results found for: INR Lab Results  Component Value Date   CHOL 140 07/25/2017   HDL 40.50 07/25/2017   LDLCALC 73 07/25/2017   TRIG 132.0 07/25/2017     GEN- The patient is well appearing, alert and oriented x 3 today.   Head- normocephalic, atraumatic Eyes-  Sclera clear, conjunctiva pink Ears- hearing intact Oropharynx- clear Neck- supple, no JVP Lymph- no cervical  lymphadenopathy Lungs- Clear to ausculation bilaterally, normal work of breathing Heart- Regular rate and rhythm, no murmurs, rubs or gallops, PMI not laterally displaced GI- soft, NT, ND, + BS Extremities- no clubbing, cyanosis, or edema MS- no significant deformity or atrophy Skin- no rash or lesion Psych- euthymic mood, full affect Neuro- strength and sensation are intact  EKG-afib at 64 bpm, Qrs int 100 ms, qtc 396 ms Echo-Study Conclusions  - Left ventricle: The cavity size was normal. Wall thickness was   increased in a pattern of mild LVH. Systolic function was normal.   The estimated ejection fraction was in the range of 60% to 65%.   Wall motion was normal; there were no regional wall motion   abnormalities. - Aortic valve: There was probably mild regurgitation, though   evaluation is limited by suboptimal Doppler windows. - Aortic root: The aortic root was mildly dilated. - Ascending aorta: The ascending aorta was mildly dilated. - Aortic arch: The aortic arch was mildly dilated. - Mitral valve: There was mild regurgitation. - Left atrium: The atrium was mildly dilated. - Right ventricle: The cavity size was mildly dilated. Systolic   function was normal.   Stress test-There was no ST segment deviation noted during stress.  No T wave inversion was noted during stress.  Defect 1: There is a small defect of mild severity present in the apex location. This defect is fixed with normal wall motion suggestive of attenuation artifact.  The study is normal.  This is a low risk study.  The left ventricular ejection fraction is normal (55-65%).  Suboptimal study due to significant diaphragm attenuation      Assessment  and Plan: 1. Persistent afib Successful cardioversion but with ERAF Recent echo with normal EF and stress test , low risk  Discussed antiarrythmic's with pt and will try flecainide 50 mg bid He will start Monday Am and have f/u EKG on Wednesday He  will have repeat EKG in the [facility] office and faxed to me  If intervals ok and remains in afib, will increase to 100 mg bid and if still in afib schedule for cardioversion This may be able to be done in the [place] area to minimize travel for the pt  2. CHA2DS2VASc score of 2 States no missed doses of  eliquis 5 mg bid  3. HTN Stable  Elna Radovich C. Ike Maragh, [facility] [address] [place], [place] [address] [phone]

## 2018-04-23 ENCOUNTER — Ambulatory Visit (INDEPENDENT_AMBULATORY_CARE_PROVIDER_SITE_OTHER): Payer: Medicare Other | Admitting: *Deleted

## 2018-04-23 ENCOUNTER — Other Ambulatory Visit (HOSPITAL_COMMUNITY): Payer: Self-pay | Admitting: *Deleted

## 2018-04-23 VITALS — BP 128/85 | HR 69 | Ht 73.0 in

## 2018-04-23 DIAGNOSIS — Z01810 Encounter for preprocedural cardiovascular examination: Secondary | ICD-10-CM

## 2018-04-23 DIAGNOSIS — I4819 Other persistent atrial fibrillation: Secondary | ICD-10-CM

## 2018-04-23 DIAGNOSIS — I481 Persistent atrial fibrillation: Secondary | ICD-10-CM

## 2018-04-23 MED ORDER — FLECAINIDE ACETATE 50 MG PO TABS: 100.0000 mg | ORAL_TABLET | Freq: Two times a day (BID) | ORAL | 3 refills | Status: DC

## 2018-04-23 NOTE — Patient Instructions (Addendum)
Medication Instructions:  Your physician has recommended you make the following change in your medication:  1- INCREASE Flecainide to 100 mg (2 tablets) by mouth two times a day.    Labwork: Your physician recommends that you return for lab work in: TODAY- [place], BMET.   Testing/Procedures: Your physician has recommended that you have a Cardioversion (DCCV). Electrical Cardioversion uses a jolt of electricity to your heart either through paddles or wired patches attached to your chest. This is a controlled, usually prescheduled, procedure. Defibrillation is done under light anesthesia in the hospital, and you usually go home the day of the procedure. This is done to get your heart back into a normal rhythm. You are not awake for the procedure. Please see the instruction sheet given to you today.  You are scheduled for a Cardioversion on __10/1/19___ with Dr._END___ Please arrive at the [place] [facility] at __06:30__ a.m. on the day of your procedure.  DIET INSTRUCTIONS:  Nothing to eat or drink after midnight except your medications with a sip of water.         1) Labs: __TODAY____  2) Medications:  YOU MAY TAKE ALL of your remaining medications with a small amount of water.  3) Must have a responsible person to drive you home.  4) Bring a current list of your medications and current insurance cards.    If you have any questions after you get home, please call the office at [phone]   Follow-Up: Your physician recommends that you schedule a follow-up appointment in: [place] [facility].  If you need a refill on your cardiac medications before your next appointment, please call your pharmacy.    Electrical Cardioversion Electrical cardioversion is the delivery of a jolt of electricity to restore a normal rhythm to the heart. A rhythm that is too fast or is not regular keeps the heart from pumping well. In this procedure, sticky patches or metal  paddles are placed on the chest to deliver electricity to the heart from a device. This procedure may be done in an emergency if:  There is low or no blood pressure as a result of the heart rhythm.  Normal rhythm must be restored as fast as possible to protect the brain and heart from further damage.  It may save a life.  This procedure may also be done for irregular or fast heart rhythms that are not immediately life-threatening. Tell a health care provider about:  Any allergies you have.  All medicines you are taking, including vitamins, herbs, eye drops, creams, and over-the-counter medicines.  Any problems you or family members have had with anesthetic medicines.  Any blood disorders you have.  Any surgeries you have had.  Any medical conditions you have.  Whether you are pregnant or may be pregnant. What are the risks? Generally, this is a safe procedure. However, problems may occur, including:  Allergic reactions to medicines.  A blood clot that breaks free and travels to other parts of your body.  The possible return of an abnormal heart rhythm within hours or days after the procedure.  Your heart stopping (cardiac arrest). This is rare.  What happens before the procedure? Medicines  Your health care provider may have you start taking: ? Blood-thinning medicines (anticoagulants) so your blood does not clot as easily. ? Medicines may be given to help stabilize your heart rate and rhythm.  Ask your health care provider about changing or stopping your  regular medicines. This is especially important if you are taking diabetes medicines or blood thinners. General instructions  Plan to have someone take you home from the hospital or clinic.  If you will be going home right after the procedure, plan to have someone with you for 24 hours.  Follow instructions from your health care provider about eating or drinking restrictions. What happens during the  procedure?  To lower your risk of infection: ? Your health care team will wash or sanitize their hands. ? Your skin will be washed with soap.  An IV tube will be inserted into one of your veins.  You will be given a medicine to help you relax (sedative).  Sticky patches (electrodes) or metal paddles may be placed on your chest.  An electrical shock will be delivered. The procedure may vary among health care providers and hospitals. What happens after the procedure?  Your blood pressure, heart rate, breathing rate, and blood oxygen level will be monitored until the medicines you were given have worn off.  Do not drive for 24 hours if you were given a sedative.  Your heart rhythm will be watched to make sure it does not change. This information is not intended to replace advice given to you by your health care provider. Make sure you discuss any questions you have with your health care provider. Document Released: 07/13/2002 Document Revised: 03/21/2016 Document Reviewed: 01/27/2016 Elsevier Interactive Patient Education  2017 [employer].

## 2018-04-23 NOTE — Progress Notes (Signed)
1.) Reason for visit: EKG check per AFib clinic  2.) Name of MD requesting visit: [name], NP  3.) H&P: aFib  4.) ROS related to problem: Patient here for EKG f/u. Denies chest pain, shortness of breath or dizziness today. States "Actually, I feel quite chipper today." Patient says he has some edema on his ankles but its normal for him. EKG performed and med-list verified.   5.) Assessment and plan per MD: EKG faxed to [name], NP in [facility]. She advised for patient to: 1- Increase flecainide to 100 mg by mouth two times a day. 2- schedule cardioversion in [place] 3- follow up in 1 week post cardioversion for EKG and possible GXT while on flecainide if patient maintains sinus rhythm s/p cardioversion.  Patient verbalized understanding of plan of care. AVS completed and given to patient.

## 2018-04-24 LAB — BASIC METABOLIC PANEL
BUN/Creatinine Ratio: 11 (ref 10–24)
BUN: 15 mg/dL (ref 8–27)
CO2: 20 mmol/L (ref 20–29)
CREATININE: 1.31 mg/dL — AB (ref 0.76–1.27)
Calcium: 9.3 mg/dL (ref 8.6–10.2)
Chloride: 104 mmol/L (ref 96–106)
GFR calc Af Amer: 63 mL/min/{1.73_m2} (ref >59)
GFR, EST NON AFRICAN AMERICAN: 55 mL/min/{1.73_m2} — AB (ref >59)
Glucose: 77 mg/dL (ref 65–99)
Potassium: 4.4 mmol/L (ref 3.5–5.2)
Sodium: 139 mmol/L (ref 134–144)

## 2018-04-24 LAB — CBC WITH DIFFERENTIAL/PLATELET
BASOS: 0 %
Basophils Absolute: 0 10*3/uL (ref 0.0–0.2)
EOS (ABSOLUTE): 0.2 10*3/uL (ref 0.0–0.4)
Eos: 3 %
HEMOGLOBIN: 15.4 g/dL (ref 13.0–17.7)
Hematocrit: 45.3 % (ref 37.5–51.0)
IMMATURE GRANS (ABS): 0 10*3/uL (ref 0.0–0.1)
IMMATURE GRANULOCYTES: 0 %
LYMPHS: 38 %
Lymphocytes Absolute: 3.4 10*3/uL — ABNORMAL HIGH (ref 0.7–3.1)
MCH: 30 pg (ref 26.6–33.0)
MCHC: 34 g/dL (ref 31.5–35.7)
MCV: 88 fL (ref 79–97)
Monocytes Absolute: 0.8 10*3/uL (ref 0.1–0.9)
Monocytes: 9 %
NEUTROS PCT: 50 %
Neutrophils Absolute: 4.5 10*3/uL (ref 1.4–7.0)
Platelets: 153 10*3/uL (ref 150–450)
RBC: 5.14 x10E6/uL (ref 4.14–5.80)
RDW: 14.1 % (ref 12.3–15.4)
WBC: 9 10*3/uL (ref 3.4–10.8)

## 2018-04-25 ENCOUNTER — Other Ambulatory Visit (HOSPITAL_COMMUNITY): Payer: Self-pay | Admitting: *Deleted

## 2018-04-25 MED ORDER — FLECAINIDE ACETATE 100 MG PO TABS: 100.0000 mg | ORAL_TABLET | Freq: Two times a day (BID) | ORAL | 6 refills | Status: DC

## 2018-05-05 ENCOUNTER — Other Ambulatory Visit: Payer: Medicare Other

## 2018-05-05 DIAGNOSIS — Z125 Encounter for screening for malignant neoplasm of prostate: Secondary | ICD-10-CM | POA: Diagnosis not present

## 2018-05-06 ENCOUNTER — Encounter
Admission: RE | Disposition: A | Discharge status: Home / Self Care | Payer: Self-pay | Source: Ambulatory Visit | Attending: Internal Medicine

## 2018-05-06 ENCOUNTER — Other Ambulatory Visit: Payer: Medicare Other

## 2018-05-06 ENCOUNTER — Ambulatory Visit (HOSPITAL_BASED_OUTPATIENT_CLINIC_OR_DEPARTMENT_OTHER)
Admission: RE | Admit: 2018-05-06 | Discharge: 2018-05-06 | Disposition: A | Discharge status: Home / Self Care | Payer: Medicare Other | Source: Ambulatory Visit | Attending: Internal Medicine | Admitting: Internal Medicine

## 2018-05-06 ENCOUNTER — Ambulatory Visit: Payer: Medicare Other | Admitting: Anesthesiology

## 2018-05-06 DIAGNOSIS — Z85828 Personal history of other malignant neoplasm of skin: Secondary | ICD-10-CM

## 2018-05-06 DIAGNOSIS — Z79899 Other long term (current) drug therapy: Secondary | ICD-10-CM

## 2018-05-06 DIAGNOSIS — I130 Hypertensive heart and chronic kidney disease with heart failure and stage 1 through stage 4 chronic kidney disease, or unspecified chronic kidney disease: Secondary | ICD-10-CM | POA: Diagnosis not present

## 2018-05-06 DIAGNOSIS — Z87891 Personal history of nicotine dependence: Secondary | ICD-10-CM | POA: Insufficient documentation

## 2018-05-06 DIAGNOSIS — Z7901 Long term (current) use of anticoagulants: Secondary | ICD-10-CM | POA: Insufficient documentation

## 2018-05-06 DIAGNOSIS — Z882 Allergy status to sulfonamides status: Secondary | ICD-10-CM

## 2018-05-06 DIAGNOSIS — I10 Essential (primary) hypertension: Secondary | ICD-10-CM | POA: Insufficient documentation

## 2018-05-06 DIAGNOSIS — M1990 Unspecified osteoarthritis, unspecified site: Secondary | ICD-10-CM | POA: Insufficient documentation

## 2018-05-06 DIAGNOSIS — E785 Hyperlipidemia, unspecified: Secondary | ICD-10-CM | POA: Insufficient documentation

## 2018-05-06 DIAGNOSIS — I110 Hypertensive heart disease with heart failure: Secondary | ICD-10-CM | POA: Diagnosis not present

## 2018-05-06 DIAGNOSIS — R0602 Shortness of breath: Secondary | ICD-10-CM | POA: Diagnosis not present

## 2018-05-06 DIAGNOSIS — E039 Hypothyroidism, unspecified: Secondary | ICD-10-CM | POA: Insufficient documentation

## 2018-05-06 DIAGNOSIS — I4891 Unspecified atrial fibrillation: Secondary | ICD-10-CM | POA: Diagnosis not present

## 2018-05-06 DIAGNOSIS — I509 Heart failure, unspecified: Secondary | ICD-10-CM | POA: Diagnosis not present

## 2018-05-06 DIAGNOSIS — I4819 Other persistent atrial fibrillation: Secondary | ICD-10-CM | POA: Insufficient documentation

## 2018-05-06 HISTORY — PX: CARDIOVERSION: EP1203

## 2018-05-06 LAB — PSA: PROSTATE SPECIFIC AG, SERUM: 0.6 ng/mL (ref 0.0–4.0)

## 2018-05-06 SURGERY — CARDIOVERSION (CATH LAB)
Anesthesia: General

## 2018-05-06 MED ORDER — SODIUM CHLORIDE 0.9 % IV SOLN
INTRAVENOUS | Status: DC | PRN
  Administered 2018-05-06: 07:00:00 via INTRAVENOUS

## 2018-05-06 MED ORDER — PROPOFOL 10 MG/ML IV BOLUS
INTRAVENOUS | Status: AC
  Filled 2018-05-06: qty 20

## 2018-05-06 MED ORDER — ONDANSETRON HCL 4 MG/2ML IJ SOLN: 4.0000 mg | Freq: Once | INTRAMUSCULAR | Status: DC | PRN

## 2018-05-06 MED ORDER — FENTANYL CITRATE (PF) 100 MCG/2ML IJ SOLN: 25.0000 ug | INTRAMUSCULAR | Status: DC | PRN

## 2018-05-06 MED ORDER — PROPOFOL 500 MG/50ML IV EMUL
INTRAVENOUS | Status: DC | PRN
  Administered 2018-05-06: 50 ug/kg/min via INTRAVENOUS

## 2018-05-06 NOTE — Transfer of Care (Signed)
Immediate Anesthesia Transfer of Care Note  Patient: James [name]  Procedure(s) Performed: CARDIOVERSION (N/A )  Patient Location: [facility] recovery  Anesthesia Type:General  Level of Consciousness: awake and alert   Airway & Oxygen Therapy: Patient Spontanous Breathing and Patient connected to nasal cannula oxygen  Post-op Assessment: Report given to RN and Post -op Vital signs reviewed and stable  Post vital signs: Reviewed and stable  Last Vitals:  Vitals Value Taken Time  BP 122/88 05/06/2018  7:40 AM  Temp    Pulse 60 05/06/2018  7:43 AM  Resp 16 05/06/2018  7:43 AM  SpO2 92 % 05/06/2018  7:43 AM    Last Pain:  Vitals:   05/06/18 2620  TempSrc: Oral  PainSc: 0-No pain         Complications: No apparent anesthesia complications

## 2018-05-06 NOTE — Anesthesia Postprocedure Evaluation (Signed)
Anesthesia Post Note  Patient: James [name]  Procedure(s) Performed: CARDIOVERSION (N/A )  Patient location during evaluation: PACU Anesthesia Type: General Level of consciousness: awake and alert Pain management: pain level controlled Vital Signs Assessment: post-procedure vital signs reviewed and stable Respiratory status: spontaneous breathing, nonlabored ventilation, respiratory function stable and patient connected to nasal cannula oxygen Cardiovascular status: blood pressure returned to baseline and stable Postop Assessment: no apparent nausea or vomiting Anesthetic complications: no     Last Vitals:  Vitals:   05/06/18 0742 05/06/18 0743  BP:    Pulse: 62 60  Resp: 16 16  Temp:    SpO2: 92% 92%    Last Pain:  Vitals:   05/06/18 0633  TempSrc: Oral  PainSc: 0-No pain                 Molli Barrows

## 2018-05-06 NOTE — Interval H&P Note (Signed)
History and Physical Interval Note:  05/06/2018 7:17 AM  James [name]  has presented today for cardioversion, with the diagnosis of persistent atrial fibrillation.  The various methods of treatment have been discussed with the patient and family. After consideration of risks, benefits and other options for treatment, the patient has consented to  Procedure(s): CARDIOVERSION (N/A) as a surgical intervention .  The patient's history has been reviewed, patient examined, no change in status, stable for surgery.  I have reviewed the patient's chart and labs.  Questions were answered to the patient's satisfaction.     Brynlyn Dade

## 2018-05-06 NOTE — Anesthesia Post-op Follow-up Note (Signed)
Anesthesia QCDR form completed.        

## 2018-05-06 NOTE — Discharge Instructions (Signed)
Electrical Cardioversion, Care After °This sheet gives you information about how to care for yourself after your procedure. Your health care provider may also give you more specific instructions. If you have problems or questions, contact your health care provider. °What can I expect after the procedure? °After the procedure, it is common to have: °· Some redness on the skin where the shocks were given. ° °Follow these instructions at home: °· Do not drive for 24 hours if you were given a medicine to help you relax (sedative). °· Take over-the-counter and prescription medicines only as told by your health care provider. °· Ask your health care provider how to check your pulse. Check it often. °· Rest for 48 hours after the procedure or as told by your health care provider. °· Avoid or limit your caffeine use as told by your health care provider. °Contact a health care provider if: °· You feel like your heart is beating too quickly or your pulse is not regular. °· You have a serious muscle cramp that does not go away. °Get help right away if: °· You have discomfort in your chest. °· You are dizzy or you feel faint. °· You have trouble breathing or you are short of breath. °· Your speech is slurred. °· You have trouble moving an arm or leg on one side of your body. °· Your fingers or toes turn cold or blue. °This information is not intended to replace advice given to you by your health care provider. Make sure you discuss any questions you have with your health care provider. °Document Released: 05/13/2013 Document Revised: 02/24/2016 Document Reviewed: 01/27/2016 °Elsevier Interactive Patient Education © 2018 Elsevier Inc. ° °

## 2018-05-06 NOTE — CV Procedure (Signed)
    Cardioversion Note  AUDLEY HINOJOS [id number] 1946-07-13  Procedure: DC Cardioversion Indications: Persistent atrial fibrillation  Procedure Details Consent: Obtained Time Out: Verified patient identification, verified procedure, site/side was marked, verified correct patient position, special equipment/implants available, Radiology Safety Procedures followed,  medications/allergies/relevent history reviewed, required imaging and test results available.  Performed  The patient has been on adequate anticoagulation.  The patient received IV propofol by anesthesia for sedation.  Synchronous cardioversion was performed at 200 joules x 2.  The cardioversion was successful with restoration of sinus rhythm after the second shock.  Complications: No apparent complications Patient did tolerate procedure well.  Nelva Bush., MD 05/06/2018, 7:40 AM

## 2018-05-06 NOTE — Addendum Note (Signed)
Addendum  created 05/06/18 0746 by Molli Barrows, MD   Attestation recorded in [place], [place] filed

## 2018-05-06 NOTE — Anesthesia Preprocedure Evaluation (Signed)
Anesthesia Evaluation  Patient identified by MRN, date of birth, ID band Patient awake    Reviewed: Allergy & Precautions, H&P , NPO status , Patient's Chart, lab work & pertinent test results, reviewed documented beta blocker date and time   Airway Mallampati: III   Neck ROM: full    Dental  (+) Teeth Intact   Pulmonary neg pulmonary ROS, former smoker,    Pulmonary exam normal        Cardiovascular hypertension, On Medications negative cardio ROS Normal cardiovascular examAtrial Fibrillation + Valvular Problems/Murmurs  Rhythm:regular Rate:Normal     Neuro/Psych negative neurological ROS  negative psych ROS   GI/Hepatic negative GI ROS, Neg liver ROS, (+) Hepatitis -  Endo/Other  negative endocrine ROSHypothyroidism   Renal/GU negative Renal ROS  negative genitourinary   Musculoskeletal   Abdominal   Peds  Hematology negative hematology ROS (+)   Anesthesia Other Findings Past Medical History: 12/12/2015: Acute lower GI bleeding after colonoscopy and polypectomy,  resolved No date: Cardiac murmur     Comment:  a. 02/2018 Echo: EF 60-65%, no rwma, mild AI, mildly dil               Ao root/Asc Ao/Arch, Mild MR, mildly dil LA. Nl RV fxn. No date: Cataract     Comment:  bilateral surgery to remove No date: Colon polyp No date: Constipation No date: Dilated aortic root ([place])     Comment:  a. 02/2018 Echo: mildly dil Ao root/asc ao/arch. No date: Essential hypertension No date: Hemorrhoids No date: Hepatitis     Comment:  self resolved, likely food exposure, 1974.  No date: History of cardiovascular stress test     Comment:  a. 02/2018 Myoview: EF 55-65%, small/mild apical defect               w/ nl wall motion ->attenuation artifact. No ischemia.               Low risk study. 12/05/2015: Hx of adenomatous colonic polyps No date: Hyperlipidemia No date: Hypothyroidism No date: Osteoarthritis No date: Persistent  atrial fibrillation     Comment:  a. Dx 02/2018. CHA2DS2VASc = 2-->Eliquis; b. 03/2018 DCCV               (200J). No date: Skin cancer     Comment:  basal cell, R ear, MOHS Past Surgical History: 10/23/2016: BROW LIFT; Bilateral     Comment:  Procedure: BLEPHAROPLASTY upper eyelid with excess skin;              Surgeon: Karle Starch, MD;  Location: [facility];  Service: Ophthalmology;  Laterality: Bilateral;                MAC 03/18/2018: CARDIOVERSION; N/A     Comment:  Procedure: CARDIOVERSION;  Surgeon: Nelva Bush,               MD;  Location: [facility];  Service: Cardiovascular;                Laterality: N/A; 1986: CATARACT EXTRACTION     Comment:  OD 1995: CATARACT EXTRACTION; Bilateral     Comment:  x 2 for right and left  No date: COLONOSCOPY 10/23/2016: ECTROPION REPAIR; Bilateral     Comment:  Procedure: REPAIR OF ECTROPION sutures, extensive;                Surgeon:  Amy Dennie Maizes, MD;  Location: [facility];  Service: Ophthalmology;  Laterality: Bilateral; 06/1997: LIPOMA EXCISION     Comment:  left neck ([place]) 2013: MOHS SURGERY; Right     Comment:  behind right ear No date: TONSILLECTOMY 1982: VASECTOMY BMI    Body Mass Index:  35.37 kg/m     Reproductive/Obstetrics negative OB ROS                             Anesthesia Physical Anesthesia Plan  ASA: IV  Anesthesia Plan: General   Post-op Pain Management:    Induction:   PONV Risk Score and Plan:   Airway Management Planned:   Additional Equipment:   Intra-op Plan:   Post-operative Plan:   Informed Consent: I have reviewed the patients History and Physical, chart, labs and discussed the procedure including the risks, benefits and alternatives for the proposed anesthesia with the patient or authorized representative who has indicated his/her understanding and acceptance.   Dental Advisory Given  Plan Discussed with:  CRNA  Anesthesia Plan Comments:         Anesthesia Quick Evaluation

## 2018-05-07 ENCOUNTER — Ambulatory Visit (INDEPENDENT_AMBULATORY_CARE_PROVIDER_SITE_OTHER): Payer: Medicare Other | Admitting: Urology

## 2018-05-07 ENCOUNTER — Other Ambulatory Visit: Payer: Self-pay

## 2018-05-07 ENCOUNTER — Encounter: Payer: Self-pay | Admitting: Urology

## 2018-05-07 VITALS — BP 119/78 | HR 66 | Ht 73.0 in | Wt 260.0 lb

## 2018-05-07 DIAGNOSIS — N401 Enlarged prostate with lower urinary tract symptoms: Secondary | ICD-10-CM | POA: Diagnosis not present

## 2018-05-07 DIAGNOSIS — R351 Nocturia: Secondary | ICD-10-CM

## 2018-05-07 MED ORDER — DOXAZOSIN MESYLATE 4 MG PO TABS: 4.0000 mg | ORAL_TABLET | Freq: Every day | ORAL | 3 refills | Status: DC

## 2018-05-07 MED ORDER — FINASTERIDE 5 MG PO TABS: 5.0000 mg | ORAL_TABLET | Freq: Every day | ORAL | 3 refills | Status: DC

## 2018-05-07 NOTE — Progress Notes (Signed)
   05/07/2018 2:42 PM   [name] October 02, 1945 [id number]  Reason for visit: Follow up LUTS, PSA surveillance  HPI: I had the pleasure of meeting Mr. Hinz today in urology clinic for follow-up of lower urinary tract symptoms and PSA surveillance.  He was previously followed by Dr. Pilar Jarvis.  Regarding his urinary symptoms, he feels they are very well managed on doxazosin and finasteride.  He occasionally gets up once a night to void, otherwise is voiding with a strong stream and feels he is emptying well.  There are no aggravating or alleviating factors.  Severity is mild.  His PSA has been stable.  It is 0.6 this year from 0.7 last year.  Both values are on finasteride.  He was recently diagnosed with atrial fibrillation and is on new anticoagulation with Eliquis.   ROS: Please see flowsheet from today's date for complete review of systems.  Physical Exam: BP 119/78   Pulse 66   Ht 6\' 1"  (1.854 m)   Wt 260 lb (117.9 kg)   BMI 34.30 kg/m    Constitutional:  Alert and oriented, No acute distress. Respiratory: Normal respiratory effort, no increased work of breathing. GI: Abdomen is soft, nontender, nondistended, no abdominal masses GU: No CVA tenderness Skin: No rashes, bruises or suspicious lesions. Neurologic: Grossly intact, no focal deficits, moving all 4 extremities. Psychiatric: Normal mood and affect  Laboratory Data: PSA 04/2018: 0.6(on finasteride) 2018: 0.7 (on finasteride) 2017: 2.88  Assessment & Plan:   In summary, Mr. Bommarito is a 72 year old man we are following for lower urinary tract symptoms that are very well managed with maximal medical therapy with doxazosin and finasteride.  We have also been following him for PSA screening, and his PSAs have been stable and reassuring with most recent value of 0.6 (corrected on finasteride 1.2).  We discussed AUA recommendations to discontinue PSA screening in men over the age of 69, and he is in agreement.  He would  like to follow-up with his PCP regarding refills for doxazosin and finasteride on a yearly basis.  We are happy to see him back in the future if he develops worsening urinary symptoms.  Billey Co, [facility] [address], [place], [place] [address] [phone]

## 2018-05-08 ENCOUNTER — Other Ambulatory Visit: Payer: Self-pay

## 2018-05-08 ENCOUNTER — Encounter: Payer: Self-pay | Admitting: Emergency Medicine

## 2018-05-08 DIAGNOSIS — I25110 Atherosclerotic heart disease of native coronary artery with unstable angina pectoris: Secondary | ICD-10-CM | POA: Diagnosis present

## 2018-05-08 DIAGNOSIS — Z9842 Cataract extraction status, left eye: Secondary | ICD-10-CM

## 2018-05-08 DIAGNOSIS — Z8249 Family history of ischemic heart disease and other diseases of the circulatory system: Secondary | ICD-10-CM

## 2018-05-08 DIAGNOSIS — Z882 Allergy status to sulfonamides status: Secondary | ICD-10-CM

## 2018-05-08 DIAGNOSIS — Z6835 Body mass index (BMI) 35.0-35.9, adult: Secondary | ICD-10-CM

## 2018-05-08 DIAGNOSIS — Z86010 Personal history of colonic polyps: Secondary | ICD-10-CM

## 2018-05-08 DIAGNOSIS — Z823 Family history of stroke: Secondary | ICD-10-CM

## 2018-05-08 DIAGNOSIS — Z7982 Long term (current) use of aspirin: Secondary | ICD-10-CM

## 2018-05-08 DIAGNOSIS — L7602 Intraoperative hemorrhage and hematoma of skin and subcutaneous tissue complicating other procedure: Secondary | ICD-10-CM | POA: Diagnosis not present

## 2018-05-08 DIAGNOSIS — I5033 Acute on chronic diastolic (congestive) heart failure: Secondary | ICD-10-CM | POA: Diagnosis present

## 2018-05-08 DIAGNOSIS — Z7902 Long term (current) use of antithrombotics/antiplatelets: Secondary | ICD-10-CM

## 2018-05-08 DIAGNOSIS — E039 Hypothyroidism, unspecified: Secondary | ICD-10-CM | POA: Diagnosis present

## 2018-05-08 DIAGNOSIS — Z801 Family history of malignant neoplasm of trachea, bronchus and lung: Secondary | ICD-10-CM

## 2018-05-08 DIAGNOSIS — Z85828 Personal history of other malignant neoplasm of skin: Secondary | ICD-10-CM

## 2018-05-08 DIAGNOSIS — Z9841 Cataract extraction status, right eye: Secondary | ICD-10-CM

## 2018-05-08 DIAGNOSIS — I130 Hypertensive heart and chronic kidney disease with heart failure and stage 1 through stage 4 chronic kidney disease, or unspecified chronic kidney disease: Principal | ICD-10-CM | POA: Diagnosis present

## 2018-05-08 DIAGNOSIS — I110 Hypertensive heart disease with heart failure: Secondary | ICD-10-CM | POA: Diagnosis not present

## 2018-05-08 DIAGNOSIS — Z79899 Other long term (current) drug therapy: Secondary | ICD-10-CM

## 2018-05-08 DIAGNOSIS — Z7901 Long term (current) use of anticoagulants: Secondary | ICD-10-CM

## 2018-05-08 DIAGNOSIS — I160 Hypertensive urgency: Secondary | ICD-10-CM | POA: Diagnosis present

## 2018-05-08 DIAGNOSIS — Z79890 Hormone replacement therapy (postmenopausal): Secondary | ICD-10-CM

## 2018-05-08 DIAGNOSIS — N400 Enlarged prostate without lower urinary tract symptoms: Secondary | ICD-10-CM | POA: Diagnosis present

## 2018-05-08 DIAGNOSIS — M1990 Unspecified osteoarthritis, unspecified site: Secondary | ICD-10-CM | POA: Diagnosis present

## 2018-05-08 DIAGNOSIS — E785 Hyperlipidemia, unspecified: Secondary | ICD-10-CM | POA: Diagnosis present

## 2018-05-08 DIAGNOSIS — Z87891 Personal history of nicotine dependence: Secondary | ICD-10-CM

## 2018-05-08 DIAGNOSIS — I509 Heart failure, unspecified: Secondary | ICD-10-CM | POA: Diagnosis not present

## 2018-05-08 DIAGNOSIS — I712 Thoracic aortic aneurysm, without rupture: Secondary | ICD-10-CM | POA: Diagnosis present

## 2018-05-08 DIAGNOSIS — N183 Chronic kidney disease, stage 3 (moderate): Secondary | ICD-10-CM | POA: Diagnosis present

## 2018-05-08 DIAGNOSIS — I4819 Other persistent atrial fibrillation: Secondary | ICD-10-CM | POA: Diagnosis present

## 2018-05-08 DIAGNOSIS — Z9889 Other specified postprocedural states: Secondary | ICD-10-CM

## 2018-05-08 NOTE — ED Triage Notes (Signed)
Patient to ER for c/o shortness of breath and chest tightness that began approx 30 mins ago. Patient states he had cardiac ablation done on Tuesday for A-Fib. Reports feeling fine afterwards until tonight. Patient states he feels just as short of breath with exertion as he does with resting. Reports being unable to lay down d/t shortness of breath worsening.

## 2018-05-09 ENCOUNTER — Inpatient Hospital Stay (HOSPITAL_COMMUNITY)
Admit: 2018-05-09 | Discharge: 2018-05-09 | Disposition: A | Discharge status: Home / Self Care | Payer: Medicare Other | Attending: Internal Medicine | Admitting: Internal Medicine

## 2018-05-09 ENCOUNTER — Emergency Department: Payer: Medicare Other

## 2018-05-09 ENCOUNTER — Other Ambulatory Visit: Payer: Self-pay

## 2018-05-09 ENCOUNTER — Ambulatory Visit: Payer: Medicare Other

## 2018-05-09 ENCOUNTER — Inpatient Hospital Stay
Admission: EM | Admit: 2018-05-09 | Discharge: 2018-05-14 | DRG: 246 | Disposition: A | Discharge status: Home / Self Care | Payer: Medicare Other | Attending: Internal Medicine | Admitting: Internal Medicine

## 2018-05-09 DIAGNOSIS — I5031 Acute diastolic (congestive) heart failure: Secondary | ICD-10-CM

## 2018-05-09 DIAGNOSIS — I712 Thoracic aortic aneurysm, without rupture: Secondary | ICD-10-CM | POA: Diagnosis not present

## 2018-05-09 DIAGNOSIS — Z9861 Coronary angioplasty status: Secondary | ICD-10-CM | POA: Diagnosis not present

## 2018-05-09 DIAGNOSIS — I200 Unstable angina: Secondary | ICD-10-CM | POA: Diagnosis not present

## 2018-05-09 DIAGNOSIS — Z7901 Long term (current) use of anticoagulants: Secondary | ICD-10-CM | POA: Diagnosis not present

## 2018-05-09 DIAGNOSIS — I509 Heart failure, unspecified: Secondary | ICD-10-CM

## 2018-05-09 DIAGNOSIS — I340 Nonrheumatic mitral (valve) insufficiency: Secondary | ICD-10-CM | POA: Diagnosis not present

## 2018-05-09 DIAGNOSIS — Z79890 Hormone replacement therapy (postmenopausal): Secondary | ICD-10-CM | POA: Diagnosis not present

## 2018-05-09 DIAGNOSIS — I130 Hypertensive heart and chronic kidney disease with heart failure and stage 1 through stage 4 chronic kidney disease, or unspecified chronic kidney disease: Secondary | ICD-10-CM | POA: Diagnosis not present

## 2018-05-09 DIAGNOSIS — Z823 Family history of stroke: Secondary | ICD-10-CM | POA: Diagnosis not present

## 2018-05-09 DIAGNOSIS — I10 Essential (primary) hypertension: Secondary | ICD-10-CM | POA: Diagnosis not present

## 2018-05-09 DIAGNOSIS — R0602 Shortness of breath: Secondary | ICD-10-CM | POA: Diagnosis not present

## 2018-05-09 DIAGNOSIS — I160 Hypertensive urgency: Secondary | ICD-10-CM | POA: Diagnosis not present

## 2018-05-09 DIAGNOSIS — Z7902 Long term (current) use of antithrombotics/antiplatelets: Secondary | ICD-10-CM | POA: Diagnosis not present

## 2018-05-09 DIAGNOSIS — Z79899 Other long term (current) drug therapy: Secondary | ICD-10-CM | POA: Diagnosis not present

## 2018-05-09 DIAGNOSIS — I480 Paroxysmal atrial fibrillation: Secondary | ICD-10-CM | POA: Diagnosis not present

## 2018-05-09 DIAGNOSIS — N400 Enlarged prostate without lower urinary tract symptoms: Secondary | ICD-10-CM | POA: Diagnosis not present

## 2018-05-09 DIAGNOSIS — Z85828 Personal history of other malignant neoplasm of skin: Secondary | ICD-10-CM | POA: Diagnosis not present

## 2018-05-09 DIAGNOSIS — I5033 Acute on chronic diastolic (congestive) heart failure: Secondary | ICD-10-CM | POA: Diagnosis not present

## 2018-05-09 DIAGNOSIS — I25110 Atherosclerotic heart disease of native coronary artery with unstable angina pectoris: Secondary | ICD-10-CM | POA: Diagnosis not present

## 2018-05-09 DIAGNOSIS — N183 Chronic kidney disease, stage 3 (moderate): Secondary | ICD-10-CM | POA: Diagnosis not present

## 2018-05-09 DIAGNOSIS — E785 Hyperlipidemia, unspecified: Secondary | ICD-10-CM | POA: Diagnosis not present

## 2018-05-09 DIAGNOSIS — I25118 Atherosclerotic heart disease of native coronary artery with other forms of angina pectoris: Secondary | ICD-10-CM | POA: Diagnosis not present

## 2018-05-09 DIAGNOSIS — E039 Hypothyroidism, unspecified: Secondary | ICD-10-CM | POA: Diagnosis not present

## 2018-05-09 DIAGNOSIS — I77810 Thoracic aortic ectasia: Secondary | ICD-10-CM | POA: Diagnosis not present

## 2018-05-09 DIAGNOSIS — I4819 Other persistent atrial fibrillation: Secondary | ICD-10-CM | POA: Diagnosis not present

## 2018-05-09 DIAGNOSIS — Z6835 Body mass index (BMI) 35.0-35.9, adult: Secondary | ICD-10-CM | POA: Diagnosis not present

## 2018-05-09 DIAGNOSIS — Z801 Family history of malignant neoplasm of trachea, bronchus and lung: Secondary | ICD-10-CM | POA: Diagnosis not present

## 2018-05-09 DIAGNOSIS — L7602 Intraoperative hemorrhage and hematoma of skin and subcutaneous tissue complicating other procedure: Secondary | ICD-10-CM | POA: Diagnosis not present

## 2018-05-09 DIAGNOSIS — Z9889 Other specified postprocedural states: Secondary | ICD-10-CM | POA: Diagnosis not present

## 2018-05-09 DIAGNOSIS — Z86010 Personal history of colonic polyps: Secondary | ICD-10-CM | POA: Diagnosis not present

## 2018-05-09 DIAGNOSIS — Z882 Allergy status to sulfonamides status: Secondary | ICD-10-CM | POA: Diagnosis not present

## 2018-05-09 DIAGNOSIS — I4891 Unspecified atrial fibrillation: Secondary | ICD-10-CM | POA: Diagnosis not present

## 2018-05-09 DIAGNOSIS — M1990 Unspecified osteoarthritis, unspecified site: Secondary | ICD-10-CM | POA: Diagnosis not present

## 2018-05-09 DIAGNOSIS — I5021 Acute systolic (congestive) heart failure: Secondary | ICD-10-CM

## 2018-05-09 LAB — HEPARIN LEVEL (UNFRACTIONATED): HEPARIN UNFRACTIONATED: 2.26 [IU]/mL — AB (ref 0.30–0.70)

## 2018-05-09 LAB — BASIC METABOLIC PANEL
ANION GAP: 8 (ref 5–15)
BUN: 16 mg/dL (ref 8–23)
CALCIUM: 9.1 mg/dL (ref 8.9–10.3)
CO2: 23 mmol/L (ref 22–32)
Chloride: 107 mmol/L (ref 98–111)
Creatinine, Ser: 1.22 mg/dL (ref 0.61–1.24)
GFR calc non Af Amer: 58 mL/min — ABNORMAL LOW (ref >60)
Glucose, Bld: 101 mg/dL — ABNORMAL HIGH (ref 70–99)
Potassium: 4 mmol/L (ref 3.5–5.1)
SODIUM: 138 mmol/L (ref 135–145)

## 2018-05-09 LAB — CBC
HCT: 42.8 % (ref 40.0–52.0)
HEMOGLOBIN: 15.1 g/dL (ref 13.0–18.0)
MCH: 31.6 pg (ref 26.0–34.0)
MCHC: 35.2 g/dL (ref 32.0–36.0)
MCV: 89.7 fL (ref 80.0–100.0)
PLATELETS: 153 10*3/uL (ref 150–440)
RBC: 4.77 MIL/uL (ref 4.40–5.90)
RDW: 14.2 % (ref 11.5–14.5)
WBC: 11.2 10*3/uL — AB (ref 3.8–10.6)

## 2018-05-09 LAB — BRAIN NATRIURETIC PEPTIDE: B NATRIURETIC PEPTIDE 5: 290 pg/mL — AB (ref 0.0–100.0)

## 2018-05-09 LAB — APTT
APTT: 47 s — AB (ref 24–36)
aPTT: 48 seconds — ABNORMAL HIGH (ref 24–36)

## 2018-05-09 LAB — ECHOCARDIOGRAM COMPLETE
Height: 72 in
Weight: 4224 oz

## 2018-05-09 LAB — TSH: TSH: 2.827 u[IU]/mL (ref 0.350–4.500)

## 2018-05-09 LAB — PROTIME-INR
INR: 1.31
Prothrombin Time: 16.2 seconds — ABNORMAL HIGH (ref 11.4–15.2)

## 2018-05-09 LAB — TROPONIN I

## 2018-05-09 IMAGING — CT CT ANGIO CHEST
2 of 7 series · 18 of 46 positions shown · IV contrast (APPLIED)
Comparison: Chest radiograph earlier this day

CLINICAL DATA: PE suspected, low pretest prob. Left-sided chest
tightness and shortness of breath. Increased shortness of breath
when lying flat.

EXAM:
CT ANGIOGRAPHY CHEST WITH CONTRAST
TECHNIQUE: Multidetector CT imaging of the chest was performed using the
standard protocol during bolus administration of intravenous
contrast. Multiplanar CT image reconstructions and MIPs were
obtained to evaluate the vascular anatomy.
CONTRAST:  75mL OMNIPAQUE IOHEXOL 350 MG/ML SOLN

[Series 6: thins · axial · 0.78mm/px · z∈[-730,-426]mm · 15 of 344 slices shown]
[im 20/344  lung]
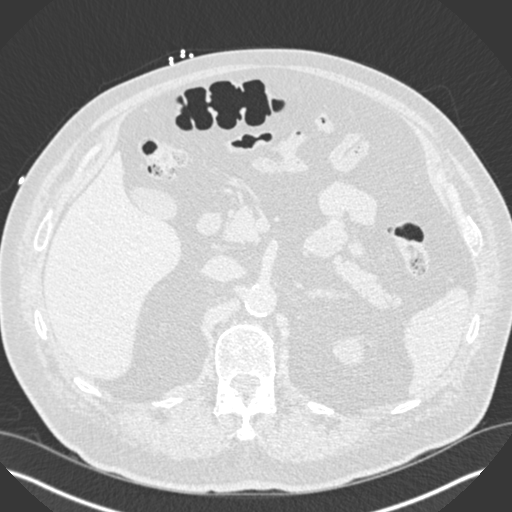
[im 39/344  soft-tissue]
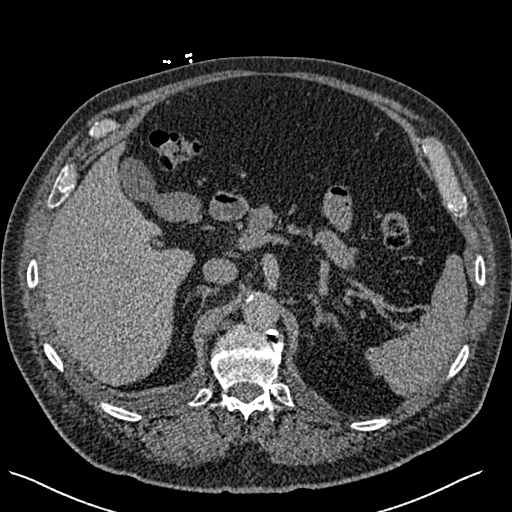
[im 58/344  lung]
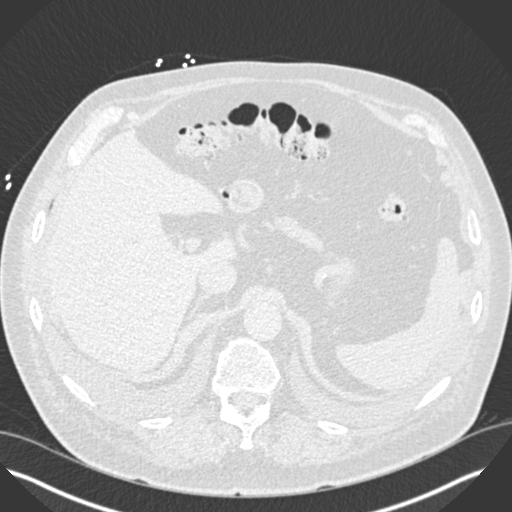
[im 77/344  soft-tissue]
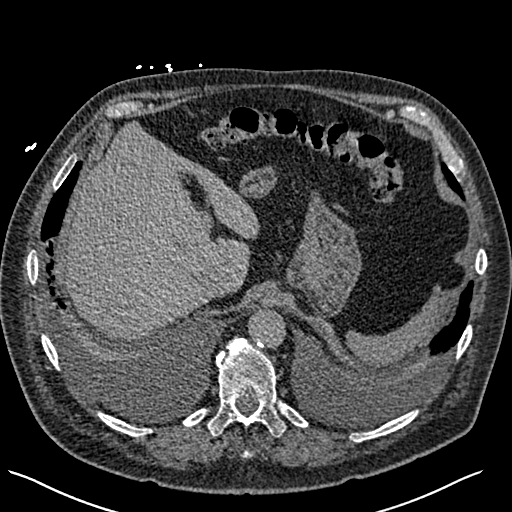
[im 115/344  lung]
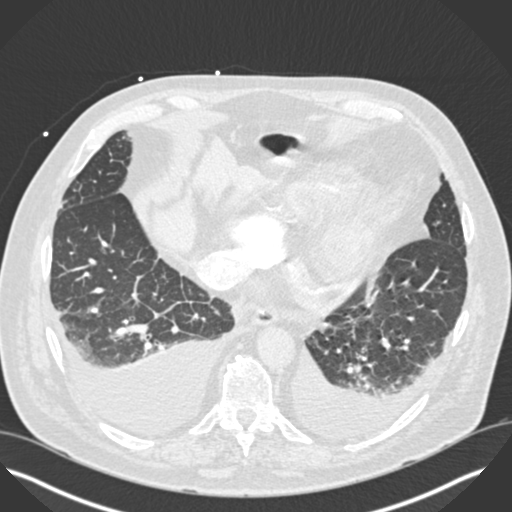
[im 134/344  soft-tissue]
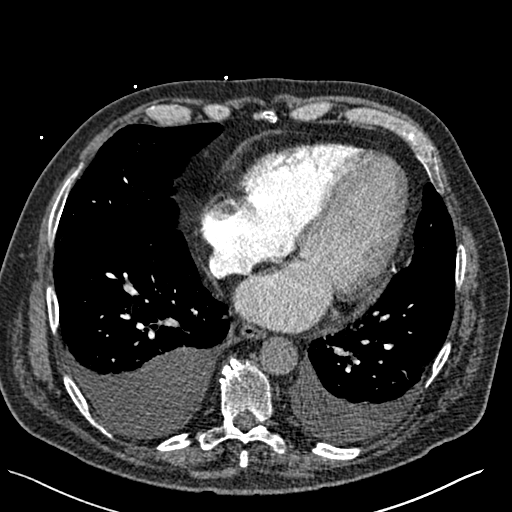
[im 153/344  lung]
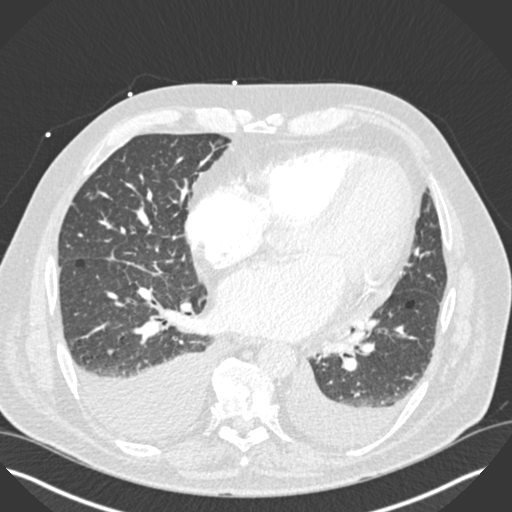
[im 172/344  soft-tissue]
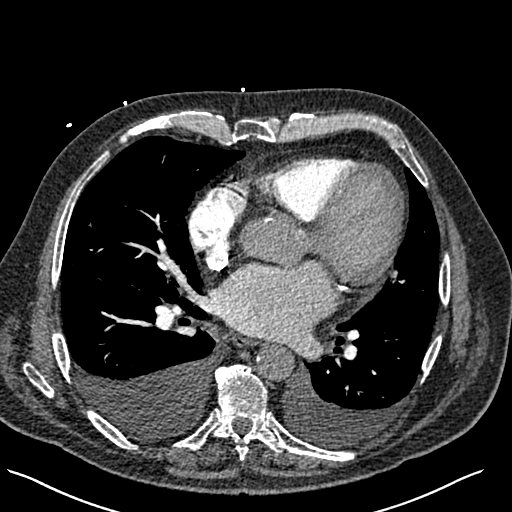
[im 191/344  lung]
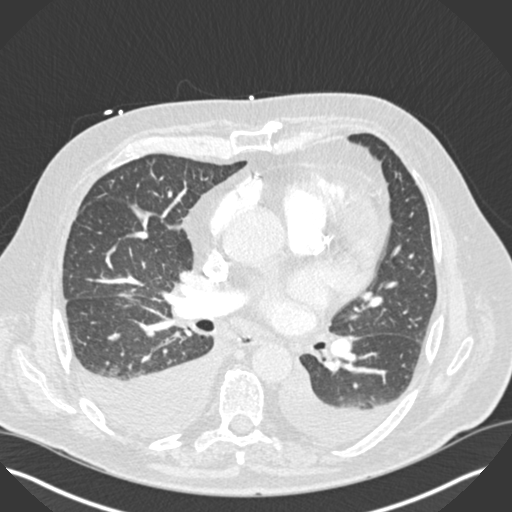
[im 210/344  soft-tissue]
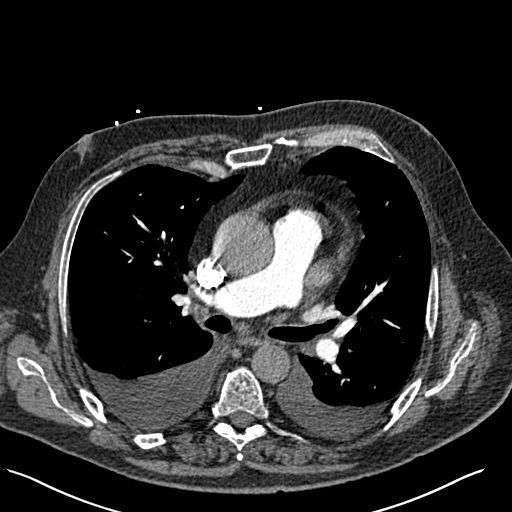
[im 229/344  lung]
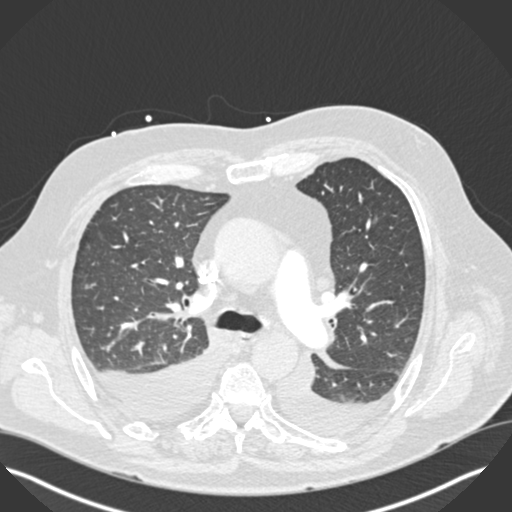
[im 267/344  soft-tissue]
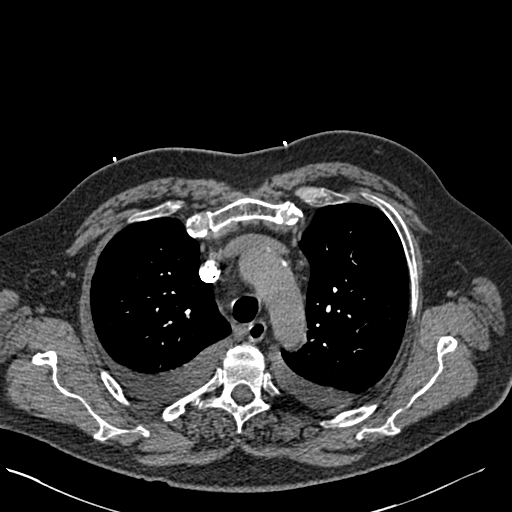
[im 286/344  lung]
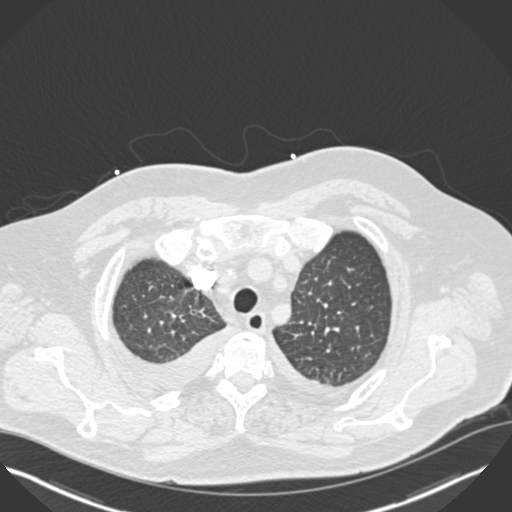
[im 305/344  soft-tissue]
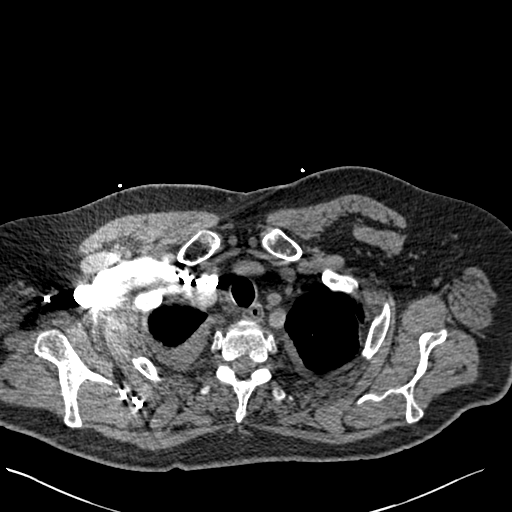
[im 324/344  lung]
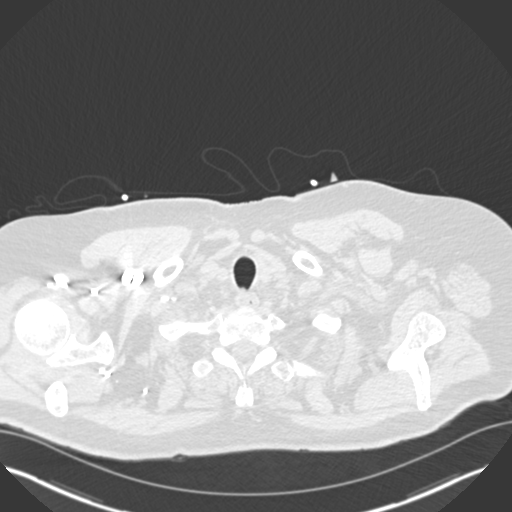

[Series 8: coronal mpr · coronal · 0.67mm/px · 3 of 110 slices shown]
[im 28/110  soft-tissue]
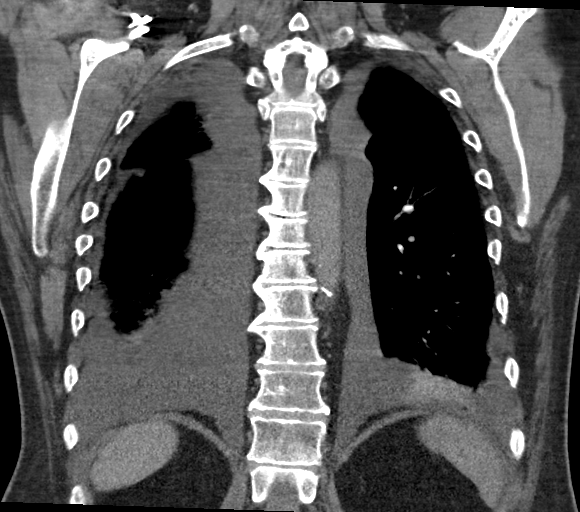
[im 55/110  soft-tissue]
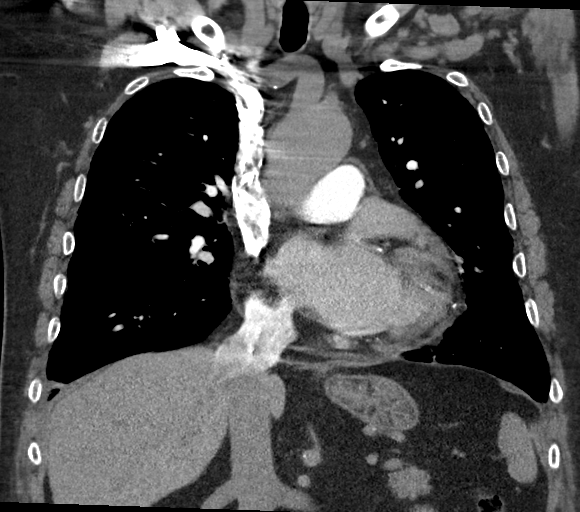
[im 82/110  soft-tissue]
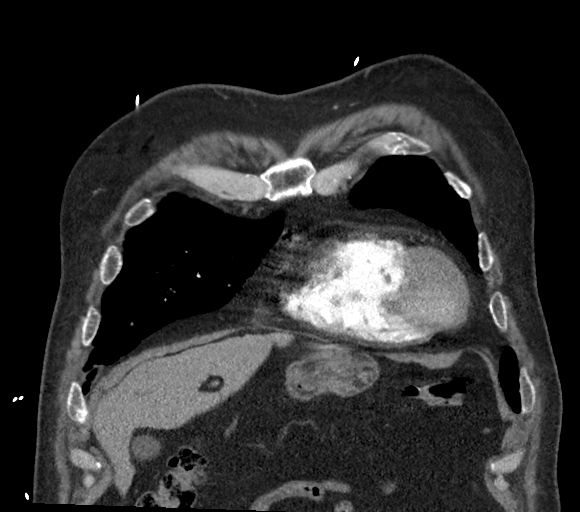

[18 of 46 positions shown; findings below may reference images not displayed]

FINDINGS: Cardiovascular: There are no filling defects within the pulmonary
arteries to suggest pulmonary embolus. Fusiform aneurysmal
dilatation of the ascending thoracic aorta, maximal dimension
cm. Cannot assess for dissection given phase of contrast tailored
for pulmonary artery evaluation. Aortic atherosclerosis. Multi
chamber cardiomegaly. Coronary artery calcifications. Small
pericardial effusion. Contrast refluxes into the IVC.

Mediastinum/Nodes: Multiple small mediastinal nodes. No thyroid
nodule. Esophagus is nondistended.

Lungs/Pleura: Small to moderate bilateral pleural effusions with
adjacent compressive atelectasis. Septal thickening consistent with
pulmonary edema. Peribronchial thickening is likely congestive. No
consolidation. No pulmonary mass. Scattered pulmonary cysts.

Upper Abdomen: Tiny hepatic hypodensities are too small to
accurately characterize. Small cyst in the posterior left kidney. No
acute findings.

Musculoskeletal: There are no acute or suspicious osseous
abnormalities. Tiny peripherally sclerotic sclerotic densities
within left lateral ribs are nonspecific but likely of no clinical
significance.

Review of the MIP images confirms the above findings.
IMPRESSION: 1. No pulmonary embolus.
2. CHF with cardiomegaly, pulmonary edema, and bilateral pleural
effusions.
3. Fusiform aneurysm of the ascending aorta, maximal dimension
cm. Recommend semi-annual imaging followup by CTA or MRA and
referral to cardiothoracic surgery if not already obtained. This
recommendation follows [XK]
ACCF/AHA/AATS/ACR/ASA/SCA/TBO/TBO/TBO/TBO Guidelines for the
Diagnosis and Management of Patients With Thoracic Aortic Disease.
Circulation. [XK]; 121: [id number]
4. Aortic aneurysm NOS ([XK]-[XK]). Aortic Atherosclerosis
([XK]-[XK]).

## 2018-05-09 IMAGING — CR DG CHEST 2V
2 series · 2 of 2 positions shown · non-contrast
Comparison: None.

CLINICAL DATA: 71 y/o  M; shortness of breath and chest tightness.

EXAM:
CHEST - 2 VIEW

[chest pa]
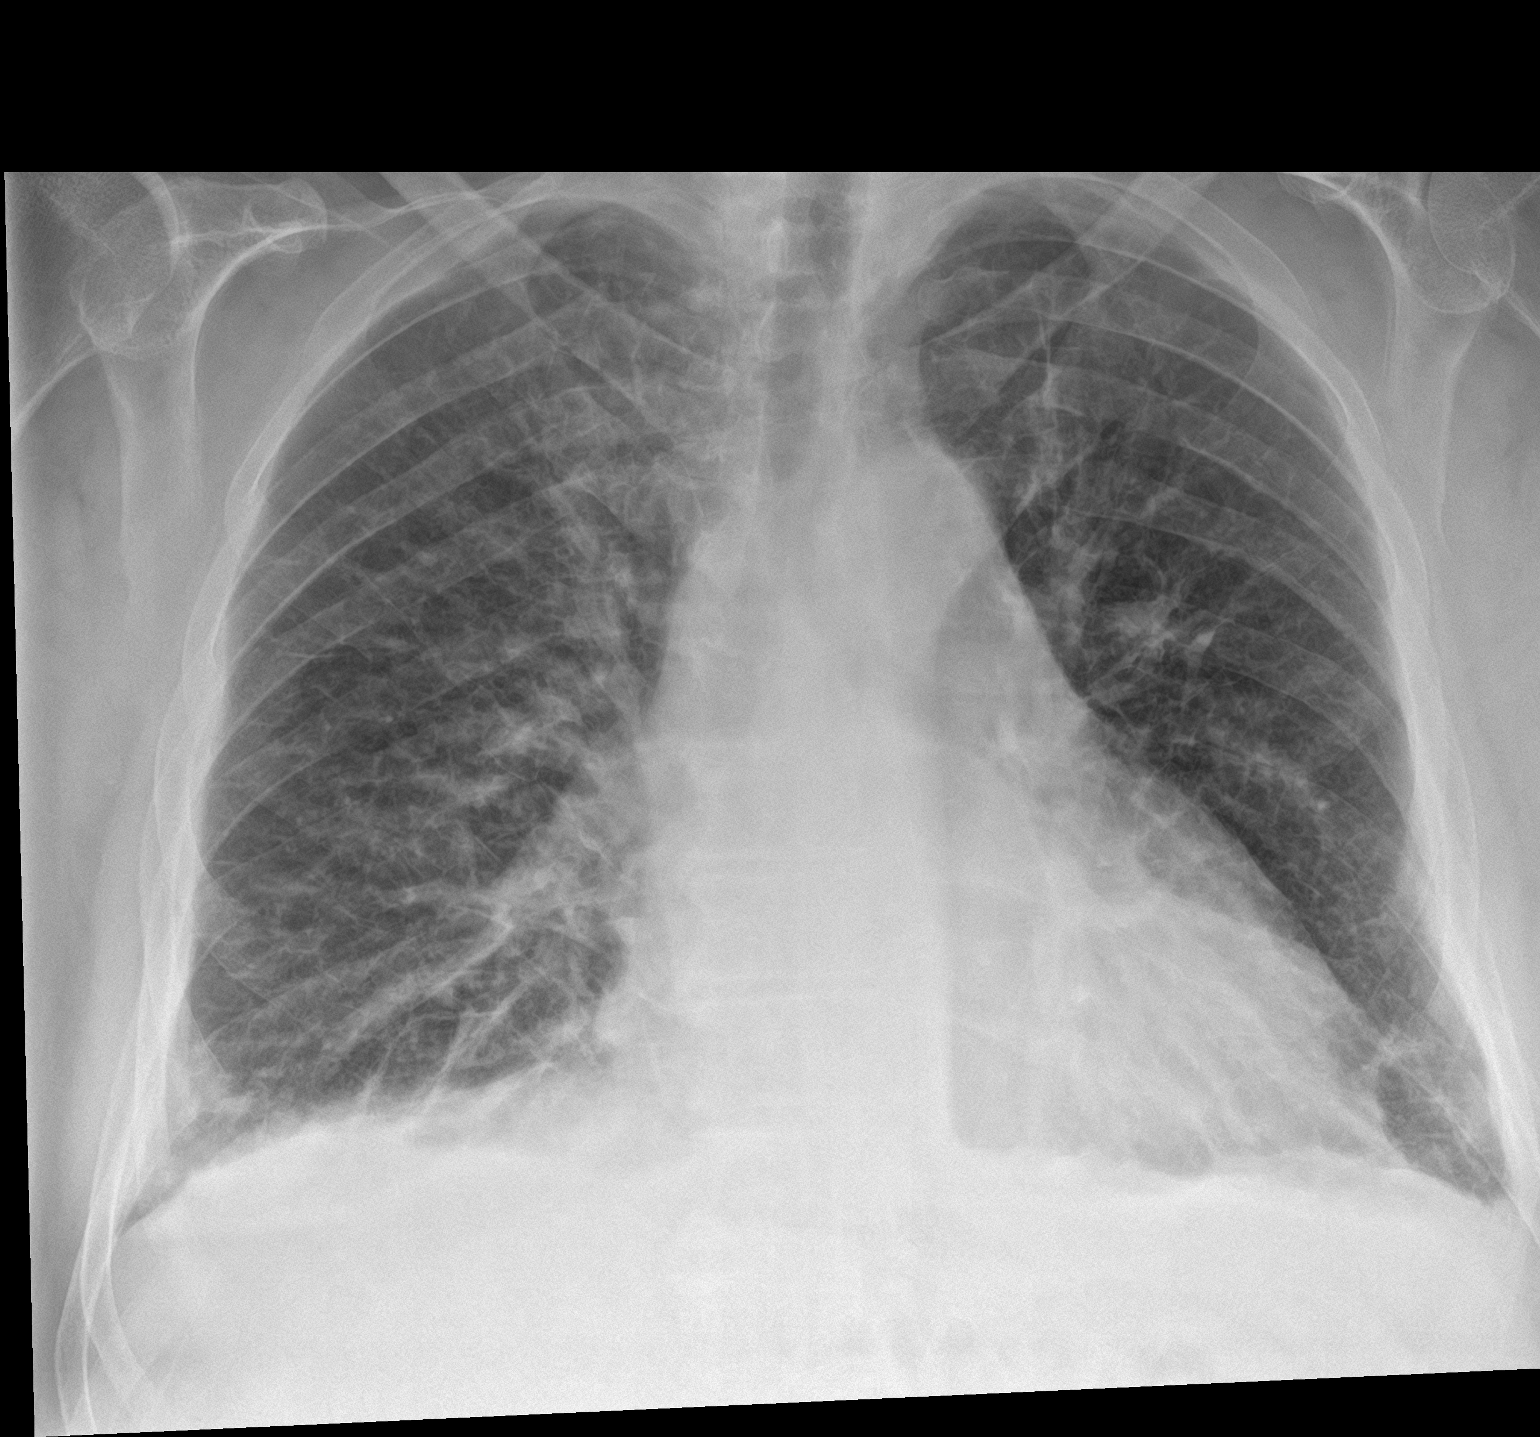

[chest lat]
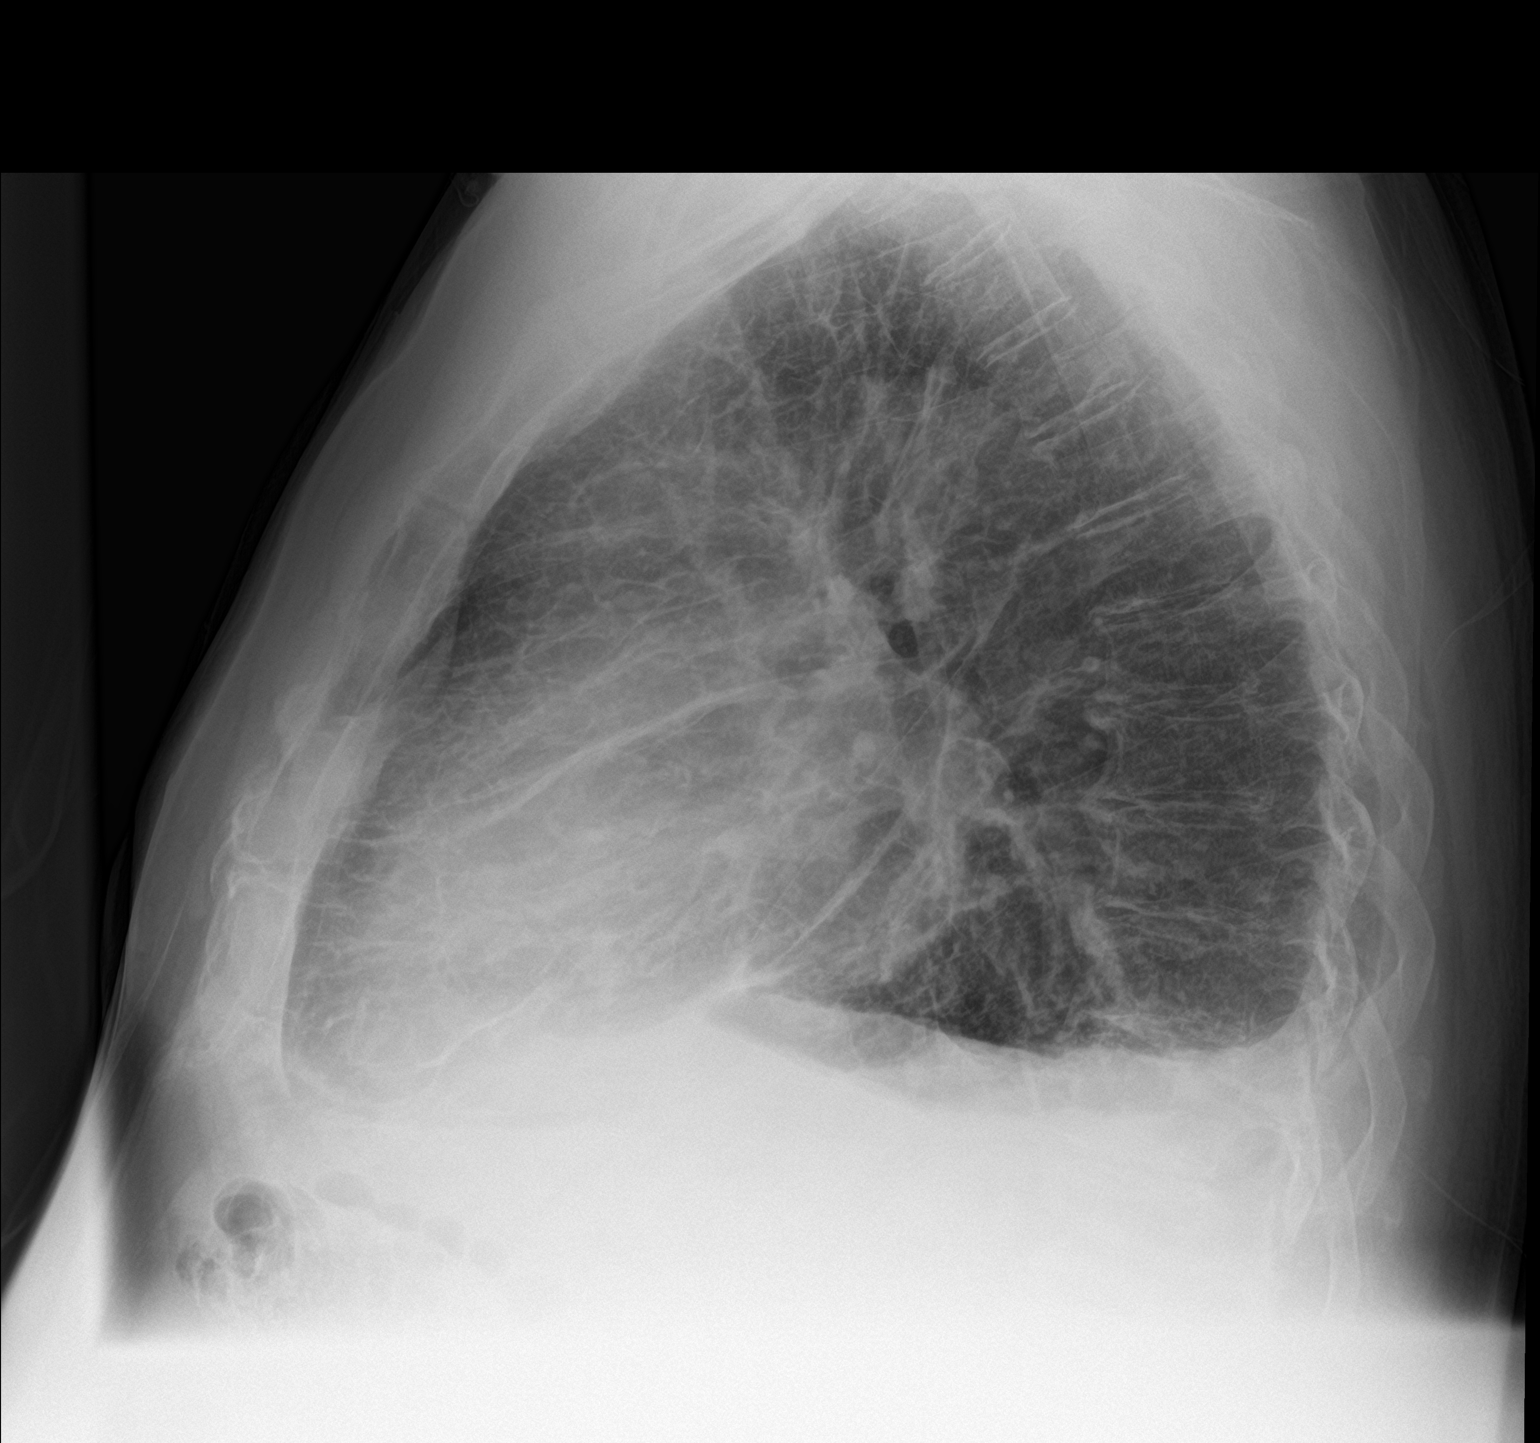

[2 of 2 positions shown; findings below may reference images not displayed]

FINDINGS: Mild cardiomegaly given projection and technique. Diffuse reticular
and peripheral linear opacities. Small bilateral pleural effusions.
No consolidation or pneumothorax. No acute osseous abnormality is
evident. Mild discogenic degenerative changes of the thoracic spine.
IMPRESSION: Interstitial pulmonary edema. Small bilateral pleural effusions.
Mild cardiomegaly.

By: AUJLA M.D.

## 2018-05-09 MED ORDER — SIMVASTATIN 20 MG PO TABS
40.0000 mg | ORAL_TABLET | Freq: Every day | ORAL | Status: DC
  Administered 2018-05-09 – 2018-05-13 (×5): 40 mg via ORAL
  Filled 2018-05-09 (×5): qty 2

## 2018-05-09 MED ORDER — FUROSEMIDE 10 MG/ML IJ SOLN
40.0000 mg | Freq: Once | INTRAMUSCULAR | Status: AC
  Administered 2018-05-09: 40 mg via INTRAVENOUS
  Filled 2018-05-09: qty 4

## 2018-05-09 MED ORDER — FUROSEMIDE 10 MG/ML IJ SOLN: 40.0000 mg | Freq: Four times a day (QID) | INTRAMUSCULAR | Status: DC

## 2018-05-09 MED ORDER — DOCUSATE SODIUM 100 MG PO CAPS
100.0000 mg | ORAL_CAPSULE | Freq: Two times a day (BID) | ORAL | Status: DC
  Administered 2018-05-09 – 2018-05-14 (×7): 100 mg via ORAL
  Filled 2018-05-09 (×8): qty 1

## 2018-05-09 MED ORDER — HEPARIN BOLUS VIA INFUSION
5000.0000 [IU] | Freq: Once | INTRAVENOUS | Status: AC
  Administered 2018-05-09: 5000 [IU] via INTRAVENOUS
  Filled 2018-05-09: qty 5000

## 2018-05-09 MED ORDER — PSYLLIUM 95 % PO PACK
1.0000 | PACK | Freq: Every day | ORAL | Status: DC
  Administered 2018-05-09 – 2018-05-14 (×4): 1 via ORAL
  Filled 2018-05-09 (×6): qty 1

## 2018-05-09 MED ORDER — AMLODIPINE BESYLATE 5 MG PO TABS
5.0000 mg | ORAL_TABLET | Freq: Every day | ORAL | Status: DC
  Administered 2018-05-09 – 2018-05-14 (×5): 5 mg via ORAL
  Filled 2018-05-09 (×5): qty 1

## 2018-05-09 MED ORDER — LEVOTHYROXINE SODIUM 50 MCG PO TABS
150.0000 ug | ORAL_TABLET | ORAL | Status: DC
  Administered 2018-05-09 – 2018-05-14 (×3): 150 ug via ORAL
  Filled 2018-05-09 (×3): qty 1

## 2018-05-09 MED ORDER — HEPARIN (PORCINE) IN NACL 100-0.45 UNIT/ML-% IJ SOLN
1550.0000 [IU]/h | INTRAMUSCULAR | Status: DC
  Administered 2018-05-09: 1550 [IU]/h via INTRAVENOUS
  Filled 2018-05-09: qty 250

## 2018-05-09 MED ORDER — IOHEXOL 350 MG/ML SOLN
75.0000 mL | Freq: Once | INTRAVENOUS | Status: AC | PRN
  Administered 2018-05-09: 75 mL via INTRAVENOUS

## 2018-05-09 MED ORDER — RAMIPRIL 10 MG PO CAPS
10.0000 mg | ORAL_CAPSULE | Freq: Every day | ORAL | Status: DC
  Administered 2018-05-09 – 2018-05-11 (×3): 10 mg via ORAL
  Filled 2018-05-09 (×3): qty 1

## 2018-05-09 MED ORDER — FLECAINIDE ACETATE 100 MG PO TABS
100.0000 mg | ORAL_TABLET | Freq: Two times a day (BID) | ORAL | Status: DC
  Administered 2018-05-09 – 2018-05-10 (×3): 100 mg via ORAL
  Filled 2018-05-09 (×7): qty 1

## 2018-05-09 MED ORDER — ONDANSETRON HCL 4 MG PO TABS: 4.0000 mg | ORAL_TABLET | Freq: Four times a day (QID) | ORAL | Status: DC | PRN

## 2018-05-09 MED ORDER — FINASTERIDE 5 MG PO TABS
5.0000 mg | ORAL_TABLET | Freq: Every day | ORAL | Status: DC
  Administered 2018-05-09 – 2018-05-14 (×5): 5 mg via ORAL
  Filled 2018-05-09 (×5): qty 1

## 2018-05-09 MED ORDER — HEPARIN (PORCINE) IN NACL 100-0.45 UNIT/ML-% IJ SOLN
1250.0000 [IU]/h | INTRAMUSCULAR | Status: DC
  Administered 2018-05-09 – 2018-05-10 (×3): 1350 [IU]/h via INTRAVENOUS
  Filled 2018-05-09 (×3): qty 250

## 2018-05-09 MED ORDER — DOXAZOSIN MESYLATE 4 MG PO TABS
4.0000 mg | ORAL_TABLET | Freq: Every day | ORAL | Status: DC
  Administered 2018-05-09 – 2018-05-14 (×5): 4 mg via ORAL
  Filled 2018-05-09 (×6): qty 1

## 2018-05-09 MED ORDER — PERFLUTREN LIPID MICROSPHERE
1.0000 mL | INTRAVENOUS | Status: AC | PRN
  Administered 2018-05-09: 3 mL via INTRAVENOUS
  Filled 2018-05-09: qty 10

## 2018-05-09 MED ORDER — NITROGLYCERIN 2 % TD OINT
0.5000 [in_us] | TOPICAL_OINTMENT | Freq: Four times a day (QID) | TRANSDERMAL | Status: DC
  Administered 2018-05-09 – 2018-05-13 (×15): 0.5 [in_us] via TOPICAL
  Filled 2018-05-09 (×15): qty 1

## 2018-05-09 MED ORDER — FUROSEMIDE 10 MG/ML IJ SOLN
40.0000 mg | Freq: Two times a day (BID) | INTRAMUSCULAR | Status: DC
  Administered 2018-05-09 – 2018-05-11 (×5): 40 mg via INTRAVENOUS
  Filled 2018-05-09 (×5): qty 4

## 2018-05-09 MED ORDER — ACETAMINOPHEN 325 MG PO TABS: 650.0000 mg | ORAL_TABLET | Freq: Four times a day (QID) | ORAL | Status: DC | PRN

## 2018-05-09 MED ORDER — ONDANSETRON HCL 4 MG/2ML IJ SOLN: 4.0000 mg | Freq: Four times a day (QID) | INTRAMUSCULAR | Status: DC | PRN

## 2018-05-09 MED ORDER — ACETAMINOPHEN 650 MG RE SUPP: 650.0000 mg | Freq: Four times a day (QID) | RECTAL | Status: DC | PRN

## 2018-05-09 MED ORDER — APIXABAN 5 MG PO TABS: 5.0000 mg | ORAL_TABLET | Freq: Two times a day (BID) | ORAL | Status: DC

## 2018-05-09 MED ORDER — METOPROLOL SUCCINATE ER 50 MG PO TB24
50.0000 mg | ORAL_TABLET | Freq: Every day | ORAL | Status: DC
  Administered 2018-05-09 – 2018-05-11 (×3): 50 mg via ORAL
  Filled 2018-05-09 (×3): qty 1

## 2018-05-09 NOTE — Progress Notes (Signed)
ANTICOAGULATION CONSULT NOTE - Initial Consult  Pharmacy Consult for Heparin Drip Indication: atrial fibrillation  Allergies  Allergen Reactions  . Sulfonamide Derivatives Rash    Patient Measurements: Height: 6' (182.9 cm) Weight: 264 lb (119.7 kg) IBW/kg (Calculated) : 77.6 Heparin Dosing Weight: 103.8 kg  Vital Signs: Temp: 98.1 F (36.7 C) (10/04 0547) BP: 169/104 (10/04 0915) Pulse Rate: 70 (10/04 0915)  Labs: Recent Labs    05/09/18 0003 05/09/18 0931 05/09/18 1536  HGB 15.1  --   --   HCT 42.8  --   --   PLT 153  --   --   APTT  --  48* >160*  LABPROT  --  16.2*  --   INR  --  1.31  --   HEPARINUNFRC  --  2.26*  --   CREATININE 1.22  --   --   TROPONINI <0.03  --   --     Estimated Creatinine Clearance: 75.3 mL/min (by C-G formula based on SCr of 1.22 mg/dL).   Medical History: Past Medical History:  Diagnosis Date  . Acute lower GI bleeding after colonoscopy and polypectomy, resolved 12/12/2015  . Cardiac murmur    a. 02/2018 Echo: EF 60-65%, no rwma, mild AI, mildly dil Ao root/Asc Ao/Arch, Mild MR, mildly dil LA. Nl RV fxn.  . Cataract    bilateral surgery to remove  . Colon polyp   . Constipation   . Dilated aortic root ([place])    a. 02/2018 Echo: mildly dil Ao root/asc ao/arch.  . Essential hypertension   . Hemorrhoids   . Hepatitis    self resolved, likely food exposure, 1974.   Marland Kitchen History of cardiovascular stress test    a. 02/2018 Myoview: EF 55-65%, small/mild apical defect w/ nl wall motion ->attenuation artifact. No ischemia. Low risk study.  Marland Kitchen Hx of adenomatous colonic polyps 12/05/2015  . Hyperlipidemia   . Hypothyroidism   . Osteoarthritis   . Persistent atrial fibrillation    a. Dx 02/2018. CHA2DS2VASc = 2-->Eliquis; b. 03/2018 DCCV (200J); c. 03/2018 recurrent Afib-->flecainide started 04/2018;  d. 05/06/2018 s/p successful DCCV - 200J x 2.  . Skin cancer    basal cell, R ear, MOHS    Medications:  Scheduled:  . amLODipine  5 mg Oral  Daily  . docusate sodium  100 mg Oral BID  . doxazosin  4 mg Oral QPC breakfast  . finasteride  5 mg Oral Daily  . flecainide  100 mg Oral BID  . furosemide  40 mg Intravenous BID  . levothyroxine  150 mcg Oral See admin instructions  . metoprolol succinate  50 mg Oral Daily  . nitroGLYCERIN  0.5 inch Topical Q6H  . psyllium  1 packet Oral Daily  . ramipril  10 mg Oral Daily  . simvastatin  40 mg Oral QHS   Infusions:    Assessment: 72 yo M to start Heparin Drip for Atrial Fibrillation. Patient on apixaban PTA. Last dose of apixban was taken ~ 2200 on 05/08/18. Hgb 15.1  Plt 153   INR 1.31  APTT 48 (drawn after drip started) HL 2.26  Goal of Therapy:  Heparin level 0.3-0.7 units/ml aPTT 66-102 seconds Monitor platelets by anticoagulation protocol: Yes   Plan:  Give 5000 units bolus x 1 Start heparin infusion at 1550 units/hr   10/04 @1536  aPTT >160.  Will hold for 2 hours and recheck aPTT prior to start.  When restarting plan to initiate at 1350 units/hour if aPTT  has fallen to a level less than 102 seconds.  Continue to monitor H&H and platelets  Will check aPTTs for dose adjustment until Heparin level correlates with aPTT. Check Heparin once per day until correlates with aPTT.  Forrest Moron, PharmD 05/09/2018,5:24 PM

## 2018-05-09 NOTE — Consult Note (Signed)
Cardiology Consult    Patient ID: James [name] MRN: [id number], DOB/AGE: Feb 04, 1946   Admit date: 05/09/2018 Date of Consult: 05/09/2018  Primary Physician: Tonia Ghent, MD Primary Cardiologist: Nelva Bush, MD Requesting Provider: Doylene Canning, MD  Patient Profile    James [name] is a 72 y.o. male with a history of HTN, HL, OA, hypothyroidism, and PAF on flecainide and s/p recent dccv on 10/1, who is being seen today for the evaluation of left chest pain and new onset heart failure at the request of Dr. Anselm Jungling.  Past Medical History   Past Medical History:  Diagnosis Date  . Acute lower GI bleeding after colonoscopy and polypectomy, resolved 12/12/2015  . Cardiac murmur    a. 02/2018 Echo: EF 60-65%, no rwma, mild AI, mildly dil Ao root/Asc Ao/Arch, Mild MR, mildly dil LA. Nl RV fxn.  . Cataract    bilateral surgery to remove  . Colon polyp   . Constipation   . Dilated aortic root ([place])    a. 02/2018 Echo: mildly dil Ao root/asc ao/arch.  . Essential hypertension   . Hemorrhoids   . Hepatitis    self resolved, likely food exposure, 1974.   Marland Kitchen History of cardiovascular stress test    a. 02/2018 Myoview: EF 55-65%, small/mild apical defect w/ nl wall motion ->attenuation artifact. No ischemia. Low risk study.  Marland Kitchen Hx of adenomatous colonic polyps 12/05/2015  . Hyperlipidemia   . Hypothyroidism   . Osteoarthritis   . Persistent atrial fibrillation    a. Dx 02/2018. CHA2DS2VASc = 2-->Eliquis; b. 03/2018 DCCV (200J); c. 03/2018 recurrent Afib-->flecainide started 04/2018;  d. 05/06/2018 s/p successful DCCV - 200J x 2.  . Skin cancer    basal cell, R ear, MOHS    Past Surgical History:  Procedure Laterality Date  . BROW LIFT Bilateral 10/23/2016   Procedure: BLEPHAROPLASTY upper eyelid with excess skin;  Surgeon: Karle Starch, MD;  Location: [place];  Service: Ophthalmology;  Laterality: Bilateral;  MAC  . CARDIOVERSION N/A 03/18/2018   Procedure: CARDIOVERSION;   Surgeon: Nelva Bush, MD;  Location: [facility];  Service: Cardiovascular;  Laterality: N/A;  . CARDIOVERSION N/A 05/06/2018   Procedure: CARDIOVERSION;  Surgeon: Nelva Bush, MD;  Location: [facility];  Service: Cardiovascular;  Laterality: N/A;  . CATARACT EXTRACTION  1986   OD  . CATARACT EXTRACTION Bilateral 1995   x 2 for right and left   . COLONOSCOPY    . ECTROPION REPAIR Bilateral 10/23/2016   Procedure: REPAIR OF ECTROPION sutures, extensive;  Surgeon: Karle Starch, MD;  Location: [place];  Service: Ophthalmology;  Laterality: Bilateral;  . LIPOMA EXCISION  06/1997   left neck ([place])  . MOHS SURGERY Right 2013   behind right ear  . TONSILLECTOMY    . VASECTOMY  1982     Allergies  Allergies  Allergen Reactions  . Sulfonamide Derivatives Rash    History of Present Illness    72 y/o ? with the above PMH including HTN, HL, hypothyroidism, obesity, OA, and persistent Afib.  In early July 2019, he was noted to be in rate controlled Afib by his PCP and was placed on Eliquis.  He was referred to cardiology.  Ehco showed nl LV fxn, mildly dil LA, and mild MR.  He was seen by Dr. Leveda Anna on 7/17, and reported DOE.  Stress testing was undertaken and was low risk.  On 8/13, he underwent DCCV, which was successful but pt  says within a few days, he again noted DOE and fatigue.  He was seen in f/u on 8/29 and was back in Afib.  blocker dose was adjusted and he was referred to AFib clinic where he was placed on flecainide on 9/12.  He then underwent repeat DCCV on 10/1, which was successful after two, 200J shocks.  Following DCCV, he was expecting to have resolution of fatigue but hasn't really noticed that yet.  He has had some chest congestion with minimally productive cough over the past 10 days, but has not been having fevers or chills.  Over the past 3 days, he has noted that when he lies down in bed @ night, he develops somewhat of a smothering sensation that resolves  after sitting up.  He has been able to eventually get comfortable and fall off to sleep.  He was not having DOE during the day but yesterday he felt very tired while outside and working on his irrigation system - it was also 100 degrees, so he went inside and rested.  Last night when he went to bed, he again noted a smothering sensation w/ dyspnea, and felt left sided chest discomfort.  Neither the smothering sensation nor the chest discomfort resolved with sitting up, so he presented to the [facility] ED for evaluation.  Here, ECG was non-acute.  Blood pressure was elevated at 171/106.  Trop was nl.  BNP was minimally elevated @ 290.  CTA chest was neg for PE but notable for CHF and bilat effusions.  He was admitted and placed on IV lasix.  He has remained in sinus rhythm overnight.  Breathing slightly improved.  He continues to note mild left chest discomfort.  Inpatient Medications    . docusate sodium  100 mg Oral BID  . doxazosin  4 mg Oral QPC breakfast  . finasteride  5 mg Oral Daily  . flecainide  100 mg Oral BID  . furosemide  40 mg Intravenous BID  . heparin  5,000 Units Intravenous Once  . levothyroxine  150 mcg Oral See admin instructions  . metoprolol succinate  50 mg Oral Daily  . nitroGLYCERIN  0.5 inch Topical Q6H  . psyllium  1 packet Oral Daily  . ramipril  10 mg Oral Daily  . simvastatin  40 mg Oral QHS    Family History    Family History  Problem Relation Age of Onset  . Heart disease Paternal Grandfather        MI, old age  . Stroke Mother   . Lung cancer Maternal Grandfather        smoker  . Stroke Maternal Grandfather   . Colon cancer Neg Hx   . Colon polyps Neg Hx   . Esophageal cancer Neg Hx   . Rectal cancer Neg Hx   . Stomach cancer Neg Hx   . Bladder Cancer Neg Hx   . Prostate cancer Neg Hx    He indicated that his mother is alive. He indicated that his father is deceased. He indicated that both of his brothers are alive. He indicated that the status of his  maternal grandfather is unknown. He indicated that his paternal grandfather is deceased. He indicated that the status of his neg hx is unknown.   Social History    Social History   Socioeconomic History  . Marital status: Married    Spouse name: Remo Lipps  . Number of children: 1  . Years of education: 83  . Highest education level:  Associate degree: occupational, technical, or vocational program  Occupational History  . Occupation: Retired    Fish farm manager: RETIRED    Comment: 1  Social Needs  . Financial resource strain: Not on file  . Food insecurity:    Worry: Not on file    Inability: Not on file  . Transportation needs:    Medical: Not on file    Non-medical: Not on file  Tobacco Use  . Smoking status: Former Smoker    Packs/day: 2.00    Years: 20.00    Pack years: 40.00    Types: Cigarettes    Last attempt to quit: 08/07/1983    Years since quitting: 34.7  . Smokeless tobacco: Never Used  . Tobacco comment: quit over 30 years ago  Substance and Sexual Activity  . Alcohol use: Yes    Alcohol/week: 7.0 standard drinks    Types: 7 Shots of liquor per week    Comment: a drink every evening, scotch or whiskey  . Drug use: No  . Sexual activity: Not on file  Lifestyle  . Physical activity:    Days per week: Not on file    Minutes per session: Not on file  . Stress: Not on file  Relationships  . Social connections:    Talks on phone: Not on file    Gets together: Not on file    Attends religious service: Not on file    Active member of club or organization: Not on file    Attends meetings of clubs or organizations: Not on file    Relationship status: Not on file  . Intimate partner violence:    Fear of current or ex partner: Not on file    Emotionally abused: Not on file    Physically abused: Not on file    Forced sexual activity: Not on file  Other Topics Concern  . Not on file  Social History Narrative   Married and lives with wife 1974   One daughter, not local     Retired from [employer], sea based telecommunication/contracting     Review of Systems    General:  No chills, fever, night sweats or weight changes. +++ fatigue Cardiovascular:  +++ left chest pain, +++ dyspnea/orthopnea, no edema, palpitations, paroxysmal nocturnal dyspnea. Dermatological: No rash, lesions/masses Respiratory: No cough, +++ dyspnea/orthopnea Urologic: No hematuria, dysuria Abdominal:   No nausea, vomiting, diarrhea, bright red blood per rectum, melena, or hematemesis Neurologic:  No visual changes, wkns, changes in mental status. All other systems reviewed and are otherwise negative except as noted above.  Physical Exam    Blood pressure (!) 180/104, pulse 78, temperature 98.1 F (36.7 C), resp. rate 18, height 6' (1.829 m), weight 119.7 kg, SpO2 95 %.  General: Pleasant, NAD Psych: Normal affect. Neuro: Alert and oriented X 3. Moves all extremities spontaneously. HEENT: Normal  Neck: Supple without bruits.  Difficult to gauge JVP 2/2 girth. Lungs:  Resp regular and unlabored, bibasilar crackles. Heart: RRR no s3, s4, 3/6 syst murmur @ LLSB to L axilla. Abdomen: Obese, soft, non-tender, non-distended, BS + x 4.  Extremities: No clubbing, cyanosis or edema. DP/PT/Radials 2+ and equal bilaterally.  Labs     Recent Labs    05/09/18 0003  TROPONINI <0.03   Lab Results  Component Value Date   WBC 11.2 (H) 05/09/2018   HGB 15.1 05/09/2018   HCT 42.8 05/09/2018   MCV 89.7 05/09/2018   PLT 153 05/09/2018    Recent Labs  Lab  05/09/18 0003  NA 138  K 4.0  CL 107  CO2 23  BUN 16  CREATININE 1.22  CALCIUM 9.1  GLUCOSE 101*   Lab Results  Component Value Date   CHOL 140 07/25/2017   HDL 40.50 07/25/2017   LDLCALC 73 07/25/2017   TRIG 132.0 07/25/2017    Radiology Studies    Dg Chest 2 View  Result Date: 05/09/2018 CLINICAL DATA:  72 y/o  M; shortness of breath and chest tightness. EXAM: CHEST - 2 VIEW COMPARISON:  None. FINDINGS: Mild cardiomegaly  given projection and technique. Diffuse reticular and peripheral linear opacities. Small bilateral pleural effusions. No consolidation or pneumothorax. No acute osseous abnormality is evident. Mild discogenic degenerative changes of the thoracic spine. IMPRESSION: Interstitial pulmonary edema. Small bilateral pleural effusions. Mild cardiomegaly. Electronically Signed   By: Kristine Garbe M.D.   On: 05/09/2018 01:00   Ct Angio Chest Pe W And/or Wo Contrast  Result Date: 05/09/2018 CLINICAL DATA:  PE suspected, low pretest prob. Left-sided chest tightness and shortness of breath. Increased shortness of breath when lying flat. EXAM: CT ANGIOGRAPHY CHEST WITH CONTRAST TECHNIQUE: Multidetector CT imaging of the chest was performed using the standard protocol during bolus administration of intravenous contrast. Multiplanar CT image reconstructions and MIPs were obtained to evaluate the vascular anatomy. CONTRAST:  53m OMNIPAQUE IOHEXOL 350 MG/ML SOLN COMPARISON:  Chest radiograph earlier this day FINDINGS: Cardiovascular: There are no filling defects within the pulmonary arteries to suggest pulmonary embolus. Fusiform aneurysmal dilatation of the ascending thoracic aorta, maximal dimension 4.7 cm. Cannot assess for dissection given phase of contrast tailored for pulmonary artery evaluation. Aortic atherosclerosis. Multi chamber cardiomegaly. Coronary artery calcifications. Small pericardial effusion. Contrast refluxes into the IVC. Mediastinum/Nodes: Multiple small mediastinal nodes. No thyroid nodule. Esophagus is nondistended. Lungs/Pleura: Small to moderate bilateral pleural effusions with adjacent compressive atelectasis. Septal thickening consistent with pulmonary edema. Peribronchial thickening is likely congestive. No consolidation. No pulmonary mass. Scattered pulmonary cysts. Upper Abdomen: Tiny hepatic hypodensities are too small to accurately characterize. Small cyst in the posterior left  kidney. No acute findings. Musculoskeletal: There are no acute or suspicious osseous abnormalities. Tiny peripherally sclerotic sclerotic densities within left lateral ribs are nonspecific but likely of no clinical significance. Review of the MIP images confirms the above findings. IMPRESSION: 1. No pulmonary embolus. 2. CHF with cardiomegaly, pulmonary edema, and bilateral pleural effusions. 3. Fusiform aneurysm of the ascending aorta, maximal dimension 4.7 cm. Recommend semi-annual imaging followup by CTA or MRA and referral to cardiothoracic surgery if not already obtained. This recommendation follows 2010 ACCF/AHA/AATS/ACR/ASA/SCA/SCAI/SIR/STS/SVM Guidelines for the Diagnosis and Management of Patients With Thoracic Aortic Disease. Circulation. 2010; 121: [id number]. Aortic aneurysm NOS (ICD10-I71.9). Aortic Atherosclerosis (ICD10-I70.0). Electronically Signed   By: MKeith RakeM.D.   On: 05/09/2018 04:22    ECG & Cardiac Imaging    Regular sinus rhythm, 76, first-degree AV block, IVCD.  No acute changes.  Assessment & Plan    1.  Acute diastolic congestive heart failure/hypertensive urgency: Patient status post cardioversion in the setting of persistent atrial fibrillation on October 1.  Since then, he has noted orthopnea in the evenings with a smothering sensation.  He has also had persistent fatigue despite maintenance of sinus rhythm.  Last night, he had worsening of orthopnea and smothering sensation associated with left-sided chest discomfort.  This prompted ER evaluation.  Here, his he was found to be in sinus rhythm.  CTA of the chest was negative for PE but notable  for CHF and bilateral pleural effusions.  He was placed on IV Lasix and admitted for further evaluation.  Breathing slightly better this morning.  He does not appear to be significantly volume overloaded on exam and weight is stable compared to prior measurements.  He does have a loud systolic murmur from the left lower sternal  border out towards the left axilla, which was not previously noted on examination..  Prior echo did show mild mitral regurgitation.  An echocardiogram has been performed this morning and those results are pending.  Continue diuresis.  Blood pressure remains elevated this morning and I will add amlodipine to his regimen.  Further recommendations following echo.  2.  Persistent atrial fibrillation: Flecainide initiated in September and he is status post cardioversion on October 1.  He is maintaining sinus rhythm.  Continue flecainide, beta-blocker.  Eliquis currently on hold and heparin ordered pending echo result in case patient requires right and left heart catheterization.  3.  Hyperlipidemia: Continue statin therapy.  LDL 73 last December.  4.  Ascending aortic aneurysm: Maximal dimension 4.7 cm noted on CT this admission.  He will need outpatient follow-up with CT surgery and serial monitoring.  Signed, Murray Hodgkins, NP 05/09/2018, 9:03 AM  For questions or updates, please contact   Please consult [email or web address] for contact info under Cardiology/STEMI.

## 2018-05-09 NOTE — Progress Notes (Addendum)
ANTICOAGULATION CONSULT NOTE - Initial Consult  Pharmacy Consult for Heparin Drip Indication: atrial fibrillation  Allergies  Allergen Reactions  . Sulfonamide Derivatives Rash    Patient Measurements: Height: 6' (182.9 cm) Weight: 264 lb (119.7 kg) IBW/kg (Calculated) : 77.6 Heparin Dosing Weight: 103.8 kg  Vital Signs: Temp: 98.1 F (36.7 C) (10/04 0547) Temp Source: Oral (10/03 2359) BP: 180/104 (10/04 0547) Pulse Rate: 78 (10/04 0547)  Labs: Recent Labs    05/09/18 0003  HGB 15.1  HCT 42.8  PLT 153  CREATININE 1.22  TROPONINI <0.03    Estimated Creatinine Clearance: 75.3 mL/min (by C-G formula based on SCr of 1.22 mg/dL).   Medical History: Past Medical History:  Diagnosis Date  . Acute lower GI bleeding after colonoscopy and polypectomy, resolved 12/12/2015  . Cardiac murmur    a. 02/2018 Echo: EF 60-65%, no rwma, mild AI, mildly dil Ao root/Asc Ao/Arch, Mild MR, mildly dil LA. Nl RV fxn.  . Cataract    bilateral surgery to remove  . Colon polyp   . Constipation   . Dilated aortic root ([place])    a. 02/2018 Echo: mildly dil Ao root/asc ao/arch.  . Essential hypertension   . Hemorrhoids   . Hepatitis    self resolved, likely food exposure, 1974.   Marland Kitchen History of cardiovascular stress test    a. 02/2018 Myoview: EF 55-65%, small/mild apical defect w/ nl wall motion ->attenuation artifact. No ischemia. Low risk study.  Marland Kitchen Hx of adenomatous colonic polyps 12/05/2015  . Hyperlipidemia   . Hypothyroidism   . Osteoarthritis   . Persistent atrial fibrillation    a. Dx 02/2018. CHA2DS2VASc = 2-->Eliquis; b. 03/2018 DCCV (200J).  . Skin cancer    basal cell, R ear, MOHS    Medications:  Scheduled:  . docusate sodium  100 mg Oral BID  . doxazosin  4 mg Oral QPC breakfast  . finasteride  5 mg Oral Daily  . flecainide  100 mg Oral BID  . furosemide  40 mg Intravenous BID  . heparin  5,000 Units Intravenous Once  . levothyroxine  150 mcg Oral See admin instructions   . metoprolol succinate  50 mg Oral Daily  . nitroGLYCERIN  0.5 inch Topical Q6H  . psyllium  1 packet Oral Daily  . ramipril  10 mg Oral Daily  . simvastatin  40 mg Oral QHS   Infusions:  . heparin      Assessment: 72 yo M to start Heparin Drip for Atrial Fibrillation. Patient on apixaban PTA. Last dose of apixban was taken ~ 2200 on 05/08/18. Hgb 15.1  Plt 153   INR 1.31  APTT 48 (drawn after drip started) HL 2.26  Goal of Therapy:  Heparin level 0.3-0.7 units/ml aPTT 66-102 seconds Monitor platelets by anticoagulation protocol: Yes   Plan:  Give 5000 units bolus x 1 Start heparin infusion at 1550 units/hr Continue to monitor H&H and platelets  Will check aPTTs for dose adjustment until Heparin level correlates with aPTT. Check Heparin once per day until correlates with aPTT.  Santosh Petter A 05/09/2018,8:34 AM

## 2018-05-09 NOTE — ED Notes (Signed)
Patient c/o left chest tightness and SOB beginning 30 minutes PTA. Patient reports increased SOB when lying flat. Patient reports Cardioversion on Tuesday - patient denies cardiac ablation as charted in the triage note.

## 2018-05-09 NOTE — Progress Notes (Signed)
*  PRELIMINARY RESULTS* Echocardiogram 2D Echocardiogram has been performed.  James [name] [name] 05/09/2018, 9:21 AM

## 2018-05-09 NOTE — ED Notes (Signed)
ED Provider at bedside. 

## 2018-05-09 NOTE — ED Notes (Signed)
Patient placed in subwait on 2L O2 d/t sats of 91-92% on RA.

## 2018-05-09 NOTE — Progress Notes (Signed)
[facility] NAME: James [name]    MR#:  [id number]  DATE OF BIRTH:  01-12-1946  SUBJECTIVE:  CHIEF COMPLAINT:   Chief Complaint  Patient presents with  . Shortness of Breath   Came with chest tightness, feels better now. On heparine drip, plan for cath Monday.  REVIEW OF SYSTEMS:  CONSTITUTIONAL: No fever, fatigue or weakness.  EYES: No blurred or double vision.  EARS, NOSE, AND THROAT: No tinnitus or ear pain.  RESPIRATORY: No cough, shortness of breath, wheezing or hemoptysis.  CARDIOVASCULAR: No chest pain, orthopnea, edema.  GASTROINTESTINAL: No nausea, vomiting, diarrhea or abdominal pain.  GENITOURINARY: No dysuria, hematuria.  ENDOCRINE: No polyuria, nocturia,  HEMATOLOGY: No anemia, easy bruising or bleeding SKIN: No rash or lesion. MUSCULOSKELETAL: No joint pain or arthritis.   NEUROLOGIC: No tingling, numbness, weakness.  PSYCHIATRY: No anxiety or depression.   ROS  DRUG ALLERGIES:   Allergies  Allergen Reactions  . Sulfonamide Derivatives Rash    VITALS:  Blood pressure 131/83, pulse (!) 58, temperature 98.2 F (36.8 C), temperature source Oral, resp. rate 16, height 6' (1.829 m), weight 119.7 kg, SpO2 92 %.  PHYSICAL EXAMINATION:  GENERAL:  72 y.o.-year-old patient lying in the bed with no acute distress.  EYES: Pupils equal, round, reactive to light and accommodation. No scleral icterus. Extraocular muscles intact.  HEENT: Head atraumatic, normocephalic. Oropharynx and nasopharynx clear.  NECK:  Supple, no jugular venous distention. No thyroid enlargement, no tenderness.  LUNGS: Normal breath sounds bilaterally, no wheezing, some crepitation. No use of accessory muscles of respiration.  CARDIOVASCULAR: S1, S2 normal. No murmurs, rubs, or gallops.  ABDOMEN: Soft, nontender, nondistended. Bowel sounds present. No organomegaly or mass.  EXTREMITIES: No pedal edema, cyanosis, or clubbing.  NEUROLOGIC:  Cranial nerves II through XII are intact. Muscle strength 5/5 in all extremities. Sensation intact. Gait not checked.  PSYCHIATRIC: The patient is alert and oriented x 3.  SKIN: No obvious rash, lesion, or ulcer.   Physical Exam LABORATORY PANEL:   CBC Recent Labs  Lab 05/09/18 0003  WBC 11.2*  HGB 15.1  HCT 42.8  PLT 153   ------------------------------------------------------------------------------------------------------------------  Chemistries  Recent Labs  Lab 05/09/18 0003  NA 138  K 4.0  CL 107  CO2 23  GLUCOSE 101*  BUN 16  CREATININE 1.22  CALCIUM 9.1   ------------------------------------------------------------------------------------------------------------------  Cardiac Enzymes Recent Labs  Lab 05/09/18 0003  TROPONINI <0.03   ------------------------------------------------------------------------------------------------------------------  RADIOLOGY:  Dg Chest 2 View  Result Date: 05/09/2018 CLINICAL DATA:  72 y/o  M; shortness of breath and chest tightness. EXAM: CHEST - 2 VIEW COMPARISON:  None. FINDINGS: Mild cardiomegaly given projection and technique. Diffuse reticular and peripheral linear opacities. Small bilateral pleural effusions. No consolidation or pneumothorax. No acute osseous abnormality is evident. Mild discogenic degenerative changes of the thoracic spine. IMPRESSION: Interstitial pulmonary edema. Small bilateral pleural effusions. Mild cardiomegaly. Electronically Signed   By: Kristine Garbe M.D.   On: 05/09/2018 01:00   Ct Angio Chest Pe W And/or Wo Contrast  Result Date: 05/09/2018 CLINICAL DATA:  PE suspected, low pretest prob. Left-sided chest tightness and shortness of breath. Increased shortness of breath when lying flat. EXAM: CT ANGIOGRAPHY CHEST WITH CONTRAST TECHNIQUE: Multidetector CT imaging of the chest was performed using the standard protocol during bolus administration of intravenous contrast. Multiplanar CT  image reconstructions and MIPs were obtained to evaluate the vascular anatomy. CONTRAST:  75mL OMNIPAQUE IOHEXOL 350 MG/ML  SOLN COMPARISON:  Chest radiograph earlier this day FINDINGS: Cardiovascular: There are no filling defects within the pulmonary arteries to suggest pulmonary embolus. Fusiform aneurysmal dilatation of the ascending thoracic aorta, maximal dimension 4.7 cm. Cannot assess for dissection given phase of contrast tailored for pulmonary artery evaluation. Aortic atherosclerosis. Multi chamber cardiomegaly. Coronary artery calcifications. Small pericardial effusion. Contrast refluxes into the IVC. Mediastinum/Nodes: Multiple small mediastinal nodes. No thyroid nodule. Esophagus is nondistended. Lungs/Pleura: Small to moderate bilateral pleural effusions with adjacent compressive atelectasis. Septal thickening consistent with pulmonary edema. Peribronchial thickening is likely congestive. No consolidation. No pulmonary mass. Scattered pulmonary cysts. Upper Abdomen: Tiny hepatic hypodensities are too small to accurately characterize. Small cyst in the posterior left kidney. No acute findings. Musculoskeletal: There are no acute or suspicious osseous abnormalities. Tiny peripherally sclerotic sclerotic densities within left lateral ribs are nonspecific but likely of no clinical significance. Review of the MIP images confirms the above findings. IMPRESSION: 1. No pulmonary embolus. 2. CHF with cardiomegaly, pulmonary edema, and bilateral pleural effusions. 3. Fusiform aneurysm of the ascending aorta, maximal dimension 4.7 cm. Recommend semi-annual imaging followup by CTA or MRA and referral to cardiothoracic surgery if not already obtained. This recommendation follows 2010 ACCF/AHA/AATS/ACR/ASA/SCA/SCAI/SIR/STS/SVM Guidelines for the Diagnosis and Management of Patients With Thoracic Aortic Disease. Circulation. 2010; 121: R443-X540 4. Aortic aneurysm NOS (ICD10-I71.9). Aortic Atherosclerosis  (ICD10-I70.0). Electronically Signed   By: Keith Rake M.D.   On: 05/09/2018 04:22    ASSESSMENT AND PLAN:   Active Problems:   CHF (congestive heart failure) ([place])  1.  CHF- ac diastolic CHF: New onset; echocardiogram from July 2019 showed normal ejection fraction.   Appreciated consult cardiology.  Continue IV Lasix. 2.  Hypertension: Uncontrolled; possible etiology of heart failure.  Continue ACE inhibitor as well as metoprolol.  3.  Hypothyroidism: Normal TSH.  Continue Synthroid. 4.  Atrial fibrillation: Rate controlled; was on Eliquis and flecainide  Stopped eliquis and started heparin drip for cath on Monday. 5.  BPH: Continue doxazosin 6  Hyperlipidemia: Continue statin therapy 7.  DVT prophylaxis: Therapeutic anticoagulation 8.  GI prophylaxis: None 9. Ischemia- plan for cath on Monday.   All the records are reviewed and case discussed with Care Management/Social Workerr. Management plans discussed with the patient, family and they are in agreement.  CODE STATUS: Full.  TOTAL TIME TAKING CARE OF THIS PATIENT: 35 minutes.    POSSIBLE D/C IN 1-2 DAYS, DEPENDING ON CLINICAL CONDITION.   Vaughan Basta M.D on 05/09/2018   Between 7am to 6pm - Pager - [phone]  After 6pm go to [email or web address] - password [email or web address]  Office  [phone]  CC: Primary care physician; Tonia Ghent, MD  Note: This dictation was prepared with Dragon dictation along with smaller phrase technology. Any transcriptional errors that result from this process are unintentional.

## 2018-05-09 NOTE — Plan of Care (Signed)
  Problem: Education: Goal: Knowledge of General Education information will improve Description Including pain rating scale, medication(s)/side effects and non-pharmacologic comfort measures Outcome: Progressing   Problem: Health Behavior/Discharge Planning: Goal: Ability to manage health-related needs will improve Outcome: Progressing   

## 2018-05-09 NOTE — H&P (Signed)
James [name] is an 72 y.o. male.   Chief Complaint: Shortness of breath HPI: The patient with past medical history of atrial fibrillation status post recent cardioversion, hypothyroidism and hypertension presents to the emergency department complaining of shortness of breath.  Patient states that his breathing became more difficult this evening.  He denies any chest pain but admits to some tightness in the upper part of his chest.  He also denies palpitations.  Chest x-ray in the emergency department showed pulmonary edema and the patient admits to some swelling of his lower extremities.  He was given Lasix IV prior to the emergency department staff calling the hospitalist service for admission.   Past Medical History:  Diagnosis Date  . Acute lower GI bleeding after colonoscopy and polypectomy, resolved 12/12/2015  . Cardiac murmur    a. 02/2018 Echo: EF 60-65%, no rwma, mild AI, mildly dil Ao root/Asc Ao/Arch, Mild MR, mildly dil LA. Nl RV fxn.  . Cataract    bilateral surgery to remove  . Colon polyp   . Constipation   . Dilated aortic root ([place])    a. 02/2018 Echo: mildly dil Ao root/asc ao/arch.  . Essential hypertension   . Hemorrhoids   . Hepatitis    self resolved, likely food exposure, 1974.   Marland Kitchen History of cardiovascular stress test    a. 02/2018 Myoview: EF 55-65%, small/mild apical defect w/ nl wall motion ->attenuation artifact. No ischemia. Low risk study.  Marland Kitchen Hx of adenomatous colonic polyps 12/05/2015  . Hyperlipidemia   . Hypothyroidism   . Osteoarthritis   . Persistent atrial fibrillation    a. Dx 02/2018. CHA2DS2VASc = 2-->Eliquis; b. 03/2018 DCCV (200J).  . Skin cancer    basal cell, R ear, MOHS    Past Surgical History:  Procedure Laterality Date  . BROW LIFT Bilateral 10/23/2016   Procedure: BLEPHAROPLASTY upper eyelid with excess skin;  Surgeon: Karle Starch, MD;  Location: [facility];  Service: Ophthalmology;  Laterality: Bilateral;  MAC  . CARDIOVERSION N/A  03/18/2018   Procedure: CARDIOVERSION;  Surgeon: Nelva Bush, MD;  Location: [facility];  Service: Cardiovascular;  Laterality: N/A;  . CARDIOVERSION N/A 05/06/2018   Procedure: CARDIOVERSION;  Surgeon: Nelva Bush, MD;  Location: [facility];  Service: Cardiovascular;  Laterality: N/A;  . CATARACT EXTRACTION  1986   OD  . CATARACT EXTRACTION Bilateral 1995   x 2 for right and left   . COLONOSCOPY    . ECTROPION REPAIR Bilateral 10/23/2016   Procedure: REPAIR OF ECTROPION sutures, extensive;  Surgeon: Karle Starch, MD;  Location: [facility];  Service: Ophthalmology;  Laterality: Bilateral;  . LIPOMA EXCISION  06/1997   left neck ([place])  . MOHS SURGERY Right 2013   behind right ear  . TONSILLECTOMY    . VASECTOMY  1982    Family History  Problem Relation Age of Onset  . Heart disease Paternal Grandfather        MI, old age  . Stroke Mother   . Lung cancer Maternal Grandfather        smoker  . Stroke Maternal Grandfather   . Colon cancer Neg Hx   . Colon polyps Neg Hx   . Esophageal cancer Neg Hx   . Rectal cancer Neg Hx   . Stomach cancer Neg Hx   . Bladder Cancer Neg Hx   . Prostate cancer Neg Hx    Social History:  reports that he quit smoking about 34 years  ago. His smoking use included cigarettes. He has a 40.00 pack-year smoking history. He has never used smokeless tobacco. He reports that he drinks about 7.0 standard drinks of alcohol per week. He reports that he does not use drugs.  Allergies:  Allergies  Allergen Reactions  . Sulfonamide Derivatives Rash    Medications Prior to Admission  Medication Sig Dispense Refill  . apixaban (ELIQUIS) 5 MG TABS tablet Take 1 tablet (5 mg total) by mouth 2 (two) times daily. 60 tablet 3  . DHA-EPA-Coenzyme Q10-Vitamin E (CO Q-10 VITAMIN E FISH OIL PO) Take 1 capsule by mouth daily.    . diclofenac sodium (VOLTAREN) 1 % GEL Apply 4 g topically 4 (four) times daily as needed. (Patient taking differently: Apply 4  g topically 4 (four) times daily as needed (knee pain.). ) 300 g 3  . doxazosin (CARDURA) 4 MG tablet Take 1 tablet (4 mg total) by mouth daily after breakfast. 90 tablet 3  . finasteride (PROSCAR) 5 MG tablet Take 1 tablet (5 mg total) by mouth daily. 90 tablet 3  . flecainide (TAMBOCOR) 100 MG tablet Take 1 tablet (100 mg total) by mouth 2 (two) times daily. 60 tablet 6  . levothyroxine (SYNTHROID, LEVOTHROID) 150 MCG tablet Take 1 tablet a day except for 1/2 tablet on Sundays.  Total of 6.5 tablets/week. (Patient taking differently: Take 150 mcg by mouth See admin instructions. Take 1 tablet a day except for 1/2 tablet on Sundays.  Total of 6.5 tablets/week.)    . metoprolol succinate (TOPROL-XL) 50 MG 24 hr tablet Take 1 tablet (50 mg total) by mouth daily. Take with or immediately following a meal. 90 tablet 3  . Multiple Vitamin (MULTIVITAMIN WITH MINERALS) TABS tablet Take 1 tablet by mouth daily. Senior Multivitamin    . psyllium (METAMUCIL) 58.6 % powder Take 1 packet by mouth daily.     . ramipril (ALTACE) 10 MG capsule Take 1 capsule (10 mg total) by mouth daily. 90 capsule 3  . simvastatin (ZOCOR) 40 MG tablet Take 1 tablet (40 mg total) by mouth at bedtime. 90 tablet 3    Results for orders placed or performed during the hospital encounter of 05/09/18 (from the past 48 hour(s))  Basic metabolic panel     Status: Abnormal   Collection Time: 05/09/18 12:03 AM  Result Value Ref Range   Sodium 138 135 - 145 mmol/L   Potassium 4.0 3.5 - 5.1 mmol/L   Chloride 107 98 - 111 mmol/L   CO2 23 22 - 32 mmol/L   Glucose, Bld 101 (H) 70 - 99 mg/dL   BUN 16 8 - 23 mg/dL   Creatinine, Ser 1.22 0.61 - 1.24 mg/dL   Calcium 9.1 8.9 - 10.3 mg/dL   GFR calc non Af Amer 58 (L) >60 mL/min   GFR calc Af Amer >60 >60 mL/min    Comment: (NOTE) The eGFR has been calculated using the CKD EPI equation. This calculation has not been validated in all clinical situations. eGFR's persistently <60 mL/min  signify possible Chronic Kidney Disease.    Anion gap 8 5 - 15    Comment: Performed at [facility], [address] [place], [place] [address]  CBC     Status: Abnormal   Collection Time: 05/09/18 12:03 AM  Result Value Ref Range   WBC 11.2 (H) 3.8 - 10.6 K/uL   RBC 4.77 4.40 - 5.90 MIL/uL   Hemoglobin 15.1 13.0 - 18.0 g/dL   HCT  42.8 40.0 - 52.0 %   MCV 89.7 80.0 - 100.0 fL   MCH 31.6 26.0 - 34.0 pg   MCHC 35.2 32.0 - 36.0 g/dL   RDW 14.2 11.5 - 14.5 %   Platelets 153 150 - 440 K/uL    Comment: Performed at [facility], [address] [place], [place] [address]  Troponin I     Status: None   Collection Time: 05/09/18 12:03 AM  Result Value Ref Range   Troponin I <0.03 <0.03 ng/mL    Comment: Performed at [facility], [address] [place], [place] [address]  Brain natriuretic peptide     Status: Abnormal   Collection Time: 05/09/18  3:44 AM  Result Value Ref Range   B Natriuretic Peptide 290.0 (H) 0.0 - 100.0 pg/mL    Comment: Performed at [facility], [address], [place], [place] [address]   Dg Chest 2 View  Result Date: 05/09/2018 CLINICAL DATA:  72 y/o  M; shortness of breath and chest tightness. EXAM: CHEST - 2 VIEW COMPARISON:  None. FINDINGS: Mild cardiomegaly given projection and technique. Diffuse reticular and peripheral linear opacities. Small bilateral pleural effusions. No consolidation or pneumothorax. No acute osseous abnormality is evident. Mild discogenic degenerative changes of the thoracic spine. IMPRESSION: Interstitial pulmonary edema. Small bilateral pleural effusions. Mild cardiomegaly. Electronically Signed   By: Kristine Garbe M.D.   On: 05/09/2018 01:00   Ct Angio Chest Pe W And/or Wo Contrast  Result Date: 05/09/2018 CLINICAL DATA:  PE suspected, low pretest prob. Left-sided chest tightness and shortness of breath. Increased shortness of breath when lying flat. EXAM: CT ANGIOGRAPHY CHEST WITH  CONTRAST TECHNIQUE: Multidetector CT imaging of the chest was performed using the standard protocol during bolus administration of intravenous contrast. Multiplanar CT image reconstructions and MIPs were obtained to evaluate the vascular anatomy. CONTRAST:  23m OMNIPAQUE IOHEXOL 350 MG/ML SOLN COMPARISON:  Chest radiograph earlier this day FINDINGS: Cardiovascular: There are no filling defects within the pulmonary arteries to suggest pulmonary embolus. Fusiform aneurysmal dilatation of the ascending thoracic aorta, maximal dimension 4.7 cm. Cannot assess for dissection given phase of contrast tailored for pulmonary artery evaluation. Aortic atherosclerosis. Multi chamber cardiomegaly. Coronary artery calcifications. Small pericardial effusion. Contrast refluxes into the IVC. Mediastinum/Nodes: Multiple small mediastinal nodes. No thyroid nodule. Esophagus is nondistended. Lungs/Pleura: Small to moderate bilateral pleural effusions with adjacent compressive atelectasis. Septal thickening consistent with pulmonary edema. Peribronchial thickening is likely congestive. No consolidation. No pulmonary mass. Scattered pulmonary cysts. Upper Abdomen: Tiny hepatic hypodensities are too small to accurately characterize. Small cyst in the posterior left kidney. No acute findings. Musculoskeletal: There are no acute or suspicious osseous abnormalities. Tiny peripherally sclerotic sclerotic densities within left lateral ribs are nonspecific but likely of no clinical significance. Review of the MIP images confirms the above findings. IMPRESSION: 1. No pulmonary embolus. 2. CHF with cardiomegaly, pulmonary edema, and bilateral pleural effusions. 3. Fusiform aneurysm of the ascending aorta, maximal dimension 4.7 cm. Recommend semi-annual imaging followup by CTA or MRA and referral to cardiothoracic surgery if not already obtained. This recommendation follows 2010 ACCF/AHA/AATS/ACR/ASA/SCA/SCAI/SIR/STS/SVM Guidelines for the  Diagnosis and Management of Patients With Thoracic Aortic Disease. Circulation. 2010; 121: [id number]. Aortic aneurysm NOS (ICD10-I71.9). Aortic Atherosclerosis (ICD10-I70.0). Electronically Signed   By: MKeith RakeM.D.   On: 05/09/2018 04:22    Review of Systems  Constitutional: Negative for chills and fever.  HENT: Negative for sore throat and tinnitus.   Eyes: Negative  for blurred vision and redness.  Respiratory: Positive for shortness of breath. Negative for cough.   Cardiovascular: Negative for chest pain, palpitations, orthopnea and PND.  Gastrointestinal: Negative for abdominal pain, diarrhea, nausea and vomiting.  Genitourinary: Negative for dysuria, frequency and urgency.  Musculoskeletal: Negative for joint pain and myalgias.  Skin: Negative for rash.       No lesions  Neurological: Negative for speech change, focal weakness and weakness.  Endo/Heme/Allergies: Does not bruise/bleed easily.       No temperature intolerance  Psychiatric/Behavioral: Negative for depression and suicidal ideas.    Blood pressure (!) 180/104, pulse 78, temperature 98.1 F (36.7 C), resp. rate 18, height 6' (1.829 m), weight 119.7 kg, SpO2 95 %. Physical Exam  Vitals reviewed. Constitutional: He is oriented to person, place, and time. He appears well-developed and well-nourished. No distress.  HENT:  Head: Normocephalic and atraumatic.  Mouth/Throat: Oropharynx is clear and moist.  Eyes: Pupils are equal, round, and reactive to light. Conjunctivae and EOM are normal. No scleral icterus.  Neck: Normal range of motion. Neck supple. No JVD present. No tracheal deviation present. No thyromegaly present.  Cardiovascular: Normal rate, regular rhythm and normal heart sounds. Exam reveals no gallop and no friction rub.  No murmur heard. Respiratory: Effort normal and breath sounds normal. No respiratory distress.  GI: Soft. Bowel sounds are normal. He exhibits no distension. There is no  tenderness.  Genitourinary:  Genitourinary Comments: Deferred  Musculoskeletal: Normal range of motion. He exhibits no edema.  Lymphadenopathy:    He has no cervical adenopathy.  Neurological: He is alert and oriented to person, place, and time. No cranial nerve deficit.  Skin: Skin is warm and dry. No rash noted. No erythema.  Psychiatric: He has a normal mood and affect. His behavior is normal. Judgment and thought content normal.     Assessment/Plan This is a 72 year old male admitted for CHF exacerbation. 1.  CHF: New onset; echocardiogram from July 2019 showed normal ejection fraction.  Obtain repeat imaging to evaluate cardiac function.  Consult cardiology.  Continue IV Lasix. 2.  Hypertension: Uncontrolled; possible etiology of heart failure.  Continue ACE inhibitor as well as metoprolol.  I have applied Nitropaste to the patient's chest as well. 3.  Hypothyroidism: The patient takes a unique regimen of Synthroid which may have resulted in severe hypothyroidism leading to edema.  Check TSH.  Continue Synthroid. 4.  Atrial fibrillation: Rate controlled; continue Eliquis and flecainide 5.  BPH: Continue doxazosin 6  Hyperlipidemia: Continue statin therapy 7.  DVT prophylaxis: Therapeutic anticoagulation 8.  GI prophylaxis: None The patient is a full code.  Time spent on admission orders and patient care approximately 45 minutes  Harrie Foreman, MD 05/09/2018, 6:10 AM

## 2018-05-09 NOTE — ED Notes (Signed)
Patient transported to X-ray 

## 2018-05-09 NOTE — ED Notes (Signed)
This RN spoke with MD Marcille Blanco regarding patient's BP. MD reported he would place order for BP medication

## 2018-05-09 NOTE — ED Notes (Signed)
Patient transported to CT 

## 2018-05-09 NOTE — ED Provider Notes (Signed)
[facility] Emergency Department Provider Note   First MD Initiated Contact with Patient 05/09/18 0500     (approximate)  I have reviewed the triage vital signs and the nursing notes.   HISTORY  Chief Complaint Shortness of Breath    HPI James [name] is a 72 y.o. male with below list of chronic medical conditions including recent cardioversion performed on 05/06/2018 by Dr. and secondary to atrial fibrillation presents to the emergency department with progressive dyspnea and chest tightness which began approximately 30 minutes before arrival.  Patient also admits to orthopnea.  In addition patient admits to 3-day history of nonproductive cough.  Past Medical History:  Diagnosis Date  . Acute lower GI bleeding after colonoscopy and polypectomy, resolved 12/12/2015  . Cardiac murmur    a. 02/2018 Echo: EF 60-65%, no rwma, mild AI, mildly dil Ao root/Asc Ao/Arch, Mild MR, mildly dil LA. Nl RV fxn.  . Cataract    bilateral surgery to remove  . Colon polyp   . Constipation   . Dilated aortic root ([place])    a. 02/2018 Echo: mildly dil Ao root/asc ao/arch.  . Essential hypertension   . Hemorrhoids   . Hepatitis    self resolved, likely food exposure, 1974.   Marland Kitchen History of cardiovascular stress test    a. 02/2018 Myoview: EF 55-65%, small/mild apical defect w/ nl wall motion ->attenuation artifact. No ischemia. Low risk study.  Marland Kitchen Hx of adenomatous colonic polyps 12/05/2015  . Hyperlipidemia   . Hypothyroidism   . Osteoarthritis   . Persistent atrial fibrillation    a. Dx 02/2018. CHA2DS2VASc = 2-->Eliquis; b. 03/2018 DCCV (200J).  . Skin cancer    basal cell, R ear, MOHS    Patient Active Problem List   Diagnosis Date Noted  . CHF (congestive heart failure) ([place]) 05/09/2018  . Benign prostatic hyperplasia with nocturia 05/07/2018  . Dyspnea on exertion 02/19/2018  . Atrial fibrillation ([place]) 02/12/2018  . Cardiac murmur 02/12/2018  . Knee pain 08/07/2017    . Poison ivy 11/21/2016  . Gross hematuria 05/01/2016  . Weak urinary stream 05/01/2016  . Healthcare maintenance 05/01/2016  . Hematuria 03/06/2016  . Hx of adenomatous colonic polyps 12/05/2015  . Advance care planning 07/21/2014  . Medicare annual wellness visit, subsequent 07/01/2012  . External hemorrhoids 06/27/2011  . Hypothyroidism 02/24/2008  . HYPERCHOLESTEROLEMIA 02/21/2007  . Essential hypertension 02/21/2007  . DEGENERATIVE JOINT DISEASE, CERVICAL SPINE 02/21/2007    Past Surgical History:  Procedure Laterality Date  . BROW LIFT Bilateral 10/23/2016   Procedure: BLEPHAROPLASTY upper eyelid with excess skin;  Surgeon: Karle Starch, MD;  Location: [place];  Service: Ophthalmology;  Laterality: Bilateral;  MAC  . CARDIOVERSION N/A 03/18/2018   Procedure: CARDIOVERSION;  Surgeon: Nelva Bush, MD;  Location: [place] [facility];  Service: Cardiovascular;  Laterality: N/A;  . CARDIOVERSION N/A 05/06/2018   Procedure: CARDIOVERSION;  Surgeon: Nelva Bush, MD;  Location: [facility];  Service: Cardiovascular;  Laterality: N/A;  . CATARACT EXTRACTION  1986   OD  . CATARACT EXTRACTION Bilateral 1995   x 2 for right and left   . COLONOSCOPY    . ECTROPION REPAIR Bilateral 10/23/2016   Procedure: REPAIR OF ECTROPION sutures, extensive;  Surgeon: Karle Starch, MD;  Location: [place];  Service: Ophthalmology;  Laterality: Bilateral;  . LIPOMA EXCISION  06/1997   left neck ([place])  . MOHS SURGERY Right 2013   behind right ear  . TONSILLECTOMY    .  VASECTOMY  1982    Prior to Admission medications   Medication Sig Start Date End Date Taking? Authorizing Provider  apixaban (ELIQUIS) 5 MG TABS tablet Take 1 tablet (5 mg total) by mouth 2 (two) times daily. 02/11/18  Yes Tonia Ghent, MD  DHA-EPA-Coenzyme Q10-Vitamin E (CO Q-10 VITAMIN E FISH OIL PO) Take 1 capsule by mouth daily.   Yes [provider]  diclofenac sodium (VOLTAREN) 1 % GEL Apply 4 g  topically 4 (four) times daily as needed. Patient taking differently: Apply 4 g topically 4 (four) times daily as needed (knee pain.).  08/05/17  Yes Tonia Ghent, MD  doxazosin (CARDURA) 4 MG tablet Take 1 tablet (4 mg total) by mouth daily after breakfast. 05/07/18  Yes Billey Co, MD  finasteride (PROSCAR) 5 MG tablet Take 1 tablet (5 mg total) by mouth daily. 05/07/18  Yes Billey Co, MD  flecainide (TAMBOCOR) 100 MG tablet Take 1 tablet (100 mg total) by mouth 2 (two) times daily. 04/25/18  Yes Sherran Needs, NP  levothyroxine (SYNTHROID, LEVOTHROID) 150 MCG tablet Take 1 tablet a day except for 1/2 tablet on Sundays.  Total of 6.5 tablets/week. Patient taking differently: Take 150 mcg by mouth See admin instructions. Take 1 tablet a day except for 1/2 tablet on Sundays.  Total of 6.5 tablets/week. 02/12/18  Yes Tonia Ghent, MD  metoprolol succinate (TOPROL-XL) 50 MG 24 hr tablet Take 1 tablet (50 mg total) by mouth daily. Take with or immediately following a meal. 04/03/18 07/02/18 Yes Theora Gianotti, NP  Multiple Vitamin (MULTIVITAMIN WITH MINERALS) TABS tablet Take 1 tablet by mouth daily. Senior Multivitamin   Yes [provider]  psyllium (METAMUCIL) 58.6 % powder Take 1 packet by mouth daily.    Yes [provider]  ramipril (ALTACE) 10 MG capsule Take 1 capsule (10 mg total) by mouth daily. 08/05/17  Yes Tonia Ghent, MD  simvastatin (ZOCOR) 40 MG tablet Take 1 tablet (40 mg total) by mouth at bedtime. 08/05/17  Yes Tonia Ghent, MD    Allergies Sulfonamide derivatives  Family History  Problem Relation Age of Onset  . Heart disease Paternal Grandfather        MI, old age  . Stroke Mother   . Lung cancer Maternal Grandfather        smoker  . Stroke Maternal Grandfather   . Colon cancer Neg Hx   . Colon polyps Neg Hx   . Esophageal cancer Neg Hx   . Rectal cancer Neg Hx   . Stomach cancer Neg Hx   . Bladder Cancer Neg Hx    . Prostate cancer Neg Hx     Social History Social History   Tobacco Use  . Smoking status: Former Smoker    Packs/day: 2.00    Years: 20.00    Pack years: 40.00    Types: Cigarettes    Last attempt to quit: 08/07/1983    Years since quitting: 34.7  . Smokeless tobacco: Never Used  . Tobacco comment: quit over 30 years ago  Substance Use Topics  . Alcohol use: Yes    Alcohol/week: 7.0 standard drinks    Types: 7 Shots of liquor per week    Comment: a drink every evening, scotch or whiskey  . Drug use: No    Review of Systems Constitutional: No fever/chills Eyes: No visual changes. ENT: No sore throat. Cardiovascular: Denies chest pain. Respiratory: Denies shortness of breath. Gastrointestinal:  No abdominal pain.  No nausea, no vomiting.  No diarrhea.  No constipation. Genitourinary: Negative for dysuria. Musculoskeletal: Negative for neck pain.  Negative for back pain. Integumentary: Negative for rash. Neurological: Negative for headaches, focal weakness or numbness.   ____________________________________________   PHYSICAL EXAM:  VITAL SIGNS: ED Triage Vitals  Enc Vitals Group     BP 05/08/18 2359 (!) 171/106     Pulse Rate 05/08/18 2359 72     Resp 05/08/18 2359 20     Temp 05/08/18 2359 98.1 F (36.7 C)     Temp Source 05/08/18 2359 Oral     SpO2 05/08/18 2359 92 %     Weight 05/08/18 2356 117.9 kg (259 lb 14.8 oz)     Height 05/08/18 2356 1.854 m (6' 1" )     Head Circumference --      Peak Flow --      Pain Score 05/08/18 2356 0     Pain Loc --      Pain Edu? --      Excl. in [place]? --     Constitutional: Alert and oriented. Well appearing and in no acute distress. Eyes: Conjunctivae are normal. Head: Atraumatic. Mouth/Throat: Mucous membranes are moist. Oropharynx non-erythematous. Neck: No stridor.  No meningeal signs.   Cardiovascular: Normal rate, regular rhythm. Good peripheral circulation. Grossly normal heart sounds. Respiratory: Normal  respiratory effort.  No retractions.  Bibasilar rales Gastrointestinal: Soft and nontender. No distention.  Musculoskeletal: No lower extremity tenderness nor edema. No gross deformities of extremities. Neurologic:  Normal speech and language. No gross focal neurologic deficits are appreciated.  Skin:  Skin is warm, dry and intact. No rash noted. Psychiatric: Mood and affect are normal. Speech and behavior are normal.  ____________________________________________   LABS (all labs ordered are listed, but only abnormal results are displayed)  Labs Reviewed  BASIC METABOLIC PANEL - Abnormal; Notable for the following components:      Result Value   Glucose, Bld 101 (*)    GFR calc non Af Amer 58 (*)    All other components within normal limits  CBC - Abnormal; Notable for the following components:   WBC 11.2 (*)    All other components within normal limits  BRAIN NATRIURETIC PEPTIDE - Abnormal; Notable for the following components:   B Natriuretic Peptide 290.0 (*)    All other components within normal limits  TROPONIN I   ____________________________________________  EKG  ED ECG REPORT I, [place] Radek Carnero, the attending physician, personally viewed and interpreted this ECG.   Date: 05/09/2018  EKG Time: 10:55 PM  Rate: 76  Rhythm: Normal sinus rhythm  Axis: Normal  Intervals: Normal  ST&T Change: None  ____________________________________________  RADIOLOGY I, [place] Lowanda Cashaw, personally viewed and evaluated these images (plain radiographs) as part of my medical decision making, as well as reviewing the written report by the radiologist.  ED MD interpretation: Chest x-ray revealed interstitial pulmonary edema with small bilateral pleural effusions, mild cardiomegaly per radiologist CT scan of the chest revealed CHF with cardiomegaly bilateral pleural effusions and interstitial edema    Official radiology report(s): Dg Chest 2 View  Result Date: 05/09/2018 CLINICAL  DATA:  72 y/o  M; shortness of breath and chest tightness. EXAM: CHEST - 2 VIEW COMPARISON:  None. FINDINGS: Mild cardiomegaly given projection and technique. Diffuse reticular and peripheral linear opacities. Small bilateral pleural effusions. No consolidation or pneumothorax. No acute osseous abnormality is evident. Mild discogenic degenerative changes of the  thoracic spine. IMPRESSION: Interstitial pulmonary edema. Small bilateral pleural effusions. Mild cardiomegaly. Electronically Signed   By: Kristine Garbe M.D.   On: 05/09/2018 01:00   Ct Angio Chest Pe W And/or Wo Contrast  Result Date: 05/09/2018 CLINICAL DATA:  PE suspected, low pretest prob. Left-sided chest tightness and shortness of breath. Increased shortness of breath when lying flat. EXAM: CT ANGIOGRAPHY CHEST WITH CONTRAST TECHNIQUE: Multidetector CT imaging of the chest was performed using the standard protocol during bolus administration of intravenous contrast. Multiplanar CT image reconstructions and MIPs were obtained to evaluate the vascular anatomy. CONTRAST:  41m OMNIPAQUE IOHEXOL 350 MG/ML SOLN COMPARISON:  Chest radiograph earlier this day FINDINGS: Cardiovascular: There are no filling defects within the pulmonary arteries to suggest pulmonary embolus. Fusiform aneurysmal dilatation of the ascending thoracic aorta, maximal dimension 4.7 cm. Cannot assess for dissection given phase of contrast tailored for pulmonary artery evaluation. Aortic atherosclerosis. Multi chamber cardiomegaly. Coronary artery calcifications. Small pericardial effusion. Contrast refluxes into the IVC. Mediastinum/Nodes: Multiple small mediastinal nodes. No thyroid nodule. Esophagus is nondistended. Lungs/Pleura: Small to moderate bilateral pleural effusions with adjacent compressive atelectasis. Septal thickening consistent with pulmonary edema. Peribronchial thickening is likely congestive. No consolidation. No pulmonary mass. Scattered pulmonary  cysts. Upper Abdomen: Tiny hepatic hypodensities are too small to accurately characterize. Small cyst in the posterior left kidney. No acute findings. Musculoskeletal: There are no acute or suspicious osseous abnormalities. Tiny peripherally sclerotic sclerotic densities within left lateral ribs are nonspecific but likely of no clinical significance. Review of the MIP images confirms the above findings. IMPRESSION: 1. No pulmonary embolus. 2. CHF with cardiomegaly, pulmonary edema, and bilateral pleural effusions. 3. Fusiform aneurysm of the ascending aorta, maximal dimension 4.7 cm. Recommend semi-annual imaging followup by CTA or MRA and referral to cardiothoracic surgery if not already obtained. This recommendation follows 2010 ACCF/AHA/AATS/ACR/ASA/SCA/SCAI/SIR/STS/SVM Guidelines for the Diagnosis and Management of Patients With Thoracic Aortic Disease. Circulation. 2010; 121: [id number]. Aortic aneurysm NOS (ICD10-I71.9). Aortic Atherosclerosis (ICD10-I70.0). Electronically Signed   By: MKeith RakeM.D.   On: 05/09/2018 04:22      Procedures   ____________________________________________   INITIAL IMPRESSION / ASSESSMENT AND PLAN / ED COURSE  As part of my medical decision making, I reviewed the following data within the electronic MEDICAL RECORD NUMBER   72year old male presenting with above-stated history and physical exam secondary to chest tightness and dyspnea as well as orthopnea.  Concern for possible CHF chest x-ray revealed interstitial edema with pleural effusions CT scan confirmed the same.  Laboratory data reveal a elevated BNP 298.  Patient's oxygen saturation 90% on room air as such 3 L nasal cannula applied.  Patient given Lasix 40 mg IV.  Patient discussed with Dr. DMarcille Blancofor hospital admission further evaluation and management. ____________________________________________  FINAL CLINICAL IMPRESSION(S) / ED DIAGNOSES  Final diagnoses:  Acute congestive heart failure,  [place]     MEDICATIONS GIVEN DURING THIS VISIT:  Medications  iohexol (OMNIPAQUE) 350 MG/ML injection 75 mL (75 mLs Intravenous Contrast Given 05/09/18 0355)  furosemide (LASIX) injection 40 mg (40 mg Intravenous Given 05/09/18 0441)     ED Discharge Orders    None       Note:  This document was prepared using Dragon voice recognition software and may include unintentional dictation errors.    BGregor Hams MD 05/09/18 [phone]

## 2018-05-10 DIAGNOSIS — I200 Unstable angina: Secondary | ICD-10-CM

## 2018-05-10 DIAGNOSIS — I4891 Unspecified atrial fibrillation: Secondary | ICD-10-CM

## 2018-05-10 LAB — APTT: aPTT: 87 seconds — ABNORMAL HIGH (ref 24–36)

## 2018-05-10 LAB — CBC
HEMATOCRIT: 44 % (ref 40.0–52.0)
HEMOGLOBIN: 14.9 g/dL (ref 13.0–18.0)
MCH: 30.1 pg (ref 26.0–34.0)
MCHC: 34 g/dL (ref 32.0–36.0)
MCV: 88.5 fL (ref 80.0–100.0)
Platelets: 154 10*3/uL (ref 150–440)
RBC: 4.97 MIL/uL (ref 4.40–5.90)
RDW: 14.4 % (ref 11.5–14.5)
WBC: 9.1 10*3/uL (ref 3.8–10.6)

## 2018-05-10 LAB — BASIC METABOLIC PANEL
Anion gap: 12 (ref 5–15)
BUN: 17 mg/dL (ref 8–23)
CHLORIDE: 99 mmol/L (ref 98–111)
CO2: 28 mmol/L (ref 22–32)
CREATININE: 1.36 mg/dL — AB (ref 0.61–1.24)
Calcium: 8.9 mg/dL (ref 8.9–10.3)
GFR calc non Af Amer: 51 mL/min — ABNORMAL LOW (ref >60)
GFR, EST AFRICAN AMERICAN: 59 mL/min — AB (ref >60)
Glucose, Bld: 97 mg/dL (ref 70–99)
POTASSIUM: 3.5 mmol/L (ref 3.5–5.1)
Sodium: 139 mmol/L (ref 135–145)

## 2018-05-10 NOTE — Progress Notes (Signed)
ANTICOAGULATION CONSULT NOTE - Initial Consult  Pharmacy Consult for Heparin Drip Indication: atrial fibrillation  Allergies  Allergen Reactions  . Sulfonamide Derivatives Rash    Patient Measurements: Height: 6' (182.9 cm) Weight: 264 lb (119.7 kg) IBW/kg (Calculated) : 77.6 Heparin Dosing Weight: 103.8 kg  Vital Signs: Temp: 98.4 F (36.9 C) (10/05 0343) Temp Source: Oral (10/05 0343) BP: 121/84 (10/05 0343) Pulse Rate: 58 (10/05 0343)  Labs: Recent Labs    05/09/18 0003  05/09/18 0931 05/09/18 1536 05/09/18 1937 05/10/18 0156  HGB 15.1  --   --   --   --  14.9  HCT 42.8  --   --   --   --  44.0  PLT 153  --   --   --   --  154  APTT  --    < > 48* >160* 47* 87*  LABPROT  --   --  16.2*  --   --   --   INR  --   --  1.31  --   --   --   HEPARINUNFRC  --   --  2.26*  --   --   --   CREATININE 1.22  --   --   --   --  1.36*  TROPONINI <0.03  --   --   --   --   --    < > = values in this interval not displayed.    Estimated Creatinine Clearance: 67.6 mL/min (A) (by C-G formula based on SCr of 1.36 mg/dL (H)).   Medical History: Past Medical History:  Diagnosis Date  . Acute lower GI bleeding after colonoscopy and polypectomy, resolved 12/12/2015  . Cardiac murmur    a. 02/2018 Echo: EF 60-65%, no rwma, mild AI, mildly dil Ao root/Asc Ao/Arch, Mild MR, mildly dil LA. Nl RV fxn.  . Cataract    bilateral surgery to remove  . Colon polyp   . Constipation   . Dilated aortic root ([place])    a. 02/2018 Echo: mildly dil Ao root/asc ao/arch.  . Essential hypertension   . Hemorrhoids   . Hepatitis    self resolved, likely food exposure, 1974.   Marland Kitchen History of cardiovascular stress test    a. 02/2018 Myoview: EF 55-65%, small/mild apical defect w/ nl wall motion ->attenuation artifact. No ischemia. Low risk study.  Marland Kitchen Hx of adenomatous colonic polyps 12/05/2015  . Hyperlipidemia   . Hypothyroidism   . Osteoarthritis   . Persistent atrial fibrillation    a. Dx 02/2018.  CHA2DS2VASc = 2-->Eliquis; b. 03/2018 DCCV (200J); c. 03/2018 recurrent Afib-->flecainide started 04/2018;  d. 05/06/2018 s/p successful DCCV - 200J x 2.  . Skin cancer    basal cell, R ear, MOHS    Medications:  Scheduled:  . amLODipine  5 mg Oral Daily  . docusate sodium  100 mg Oral BID  . doxazosin  4 mg Oral QPC breakfast  . finasteride  5 mg Oral Daily  . flecainide  100 mg Oral BID  . furosemide  40 mg Intravenous BID  . levothyroxine  150 mcg Oral See admin instructions  . metoprolol succinate  50 mg Oral Daily  . nitroGLYCERIN  0.5 inch Topical Q6H  . psyllium  1 packet Oral Daily  . ramipril  10 mg Oral Daily  . simvastatin  40 mg Oral QHS   Infusions:  . heparin 1,350 Units/hr (05/10/18 0342)    Assessment: 72 yo M to start  Heparin Drip for Atrial Fibrillation. Patient on apixaban PTA. Last dose of apixban was taken ~ 2200 on 05/08/18. Hgb 15.1  Plt 153   INR 1.31  APTT 48 (drawn after drip started) HL 2.26  Goal of Therapy:  Heparin level 0.3-0.7 units/ml aPTT 66-102 seconds Monitor platelets by anticoagulation protocol: Yes   Plan:  Give 5000 units bolus x 1 Start heparin infusion at 1550 units/hr   10/04 @1536  aPTT >160.  Will hold for 2 hours and recheck aPTT prior to start.  When restarting plan to initiate at 1350 units/hour if aPTT has fallen to a level less than 102 seconds.  Continue to monitor H&H and platelets  Will check aPTTs for dose adjustment until Heparin level correlates with aPTT. Check Heparin once per day until correlates with aPTT.  1005 0200 aPTT 87. Continue current regimen. Recheck aPTT, heparin level and CBC with tomorrow (10/6) AM labs.  Chaos Carlile S, PharmD 71/12/2017,3:58 AM

## 2018-05-10 NOTE — Progress Notes (Signed)
Progress Note  Patient Name: James [name] Date of Encounter: 05/10/2018  Primary Cardiologist: Nelva Bush, MD   Subjective   Reports improved shortness of breath Denies any lower extremity edema, abdominal bloating Presented to the hospital with substernal chest tightness concerning for unstable angina Murmur appreciated on exam concerning for mitral valve regurgitation The above discussed with him in detail  Inpatient Medications    Scheduled Meds: . amLODipine  5 mg Oral Daily  . docusate sodium  100 mg Oral BID  . doxazosin  4 mg Oral QPC breakfast  . finasteride  5 mg Oral Daily  . flecainide  100 mg Oral BID  . furosemide  40 mg Intravenous BID  . levothyroxine  150 mcg Oral See admin instructions  . metoprolol succinate  50 mg Oral Daily  . nitroGLYCERIN  0.5 inch Topical Q6H  . psyllium  1 packet Oral Daily  . ramipril  10 mg Oral Daily  . simvastatin  40 mg Oral QHS   Continuous Infusions: . heparin 1,350 Units/hr (05/10/18 0700)   PRN Meds: acetaminophen **OR** acetaminophen, ondansetron **OR** ondansetron (ZOFRAN) IV   Vital Signs    Vitals:   05/09/18 2014 05/09/18 2330 05/10/18 0343 05/10/18 0747  BP: 131/83 (!) 154/91 121/84 (!) 150/82  Pulse: (!) 58 (!) 59 (!) 58 (!) 54  Resp: 16  16   Temp: 98.2 F (36.8 C)  98.4 F (36.9 C) 97.7 F (36.5 C)  TempSrc: Oral  Oral Oral  SpO2: 92%  97% 91%  Weight:   115.5 kg   Height:        Intake/Output Summary (Last 24 hours) at 05/10/2018 1548 Last data filed at 05/10/2018 1530 Gross per 24 hour  Intake 376.82 ml  Output 800 ml  Net -423.18 ml   Filed Weights   05/08/18 2356 05/09/18 0547 05/10/18 0343  Weight: 117.9 kg 119.7 kg 115.5 kg    Telemetry    Normal sinus rhythm- Personally Reviewed  ECG      Physical Exam   GEN: No acute distress.  Morbidly obese Neck: No JVD Cardiac: RRR, 2/6 SEM LSB, rubs, or gallops.  Respiratory: Clear to auscultation bilaterally. GI: Soft,  nontender, non-distended  MS: No edema; No deformity. Neuro:  Nonfocal  Psych: Normal affect   Labs    Chemistry Recent Labs  Lab 05/09/18 0003 05/10/18 0156  NA 138 139  K 4.0 3.5  CL 107 99  CO2 23 28  GLUCOSE 101* 97  BUN 16 17  CREATININE 1.22 1.36*  CALCIUM 9.1 8.9  GFRNONAA 58* 51*  GFRAA >60 59*  ANIONGAP 8 12     Hematology Recent Labs  Lab 05/09/18 0003 05/10/18 0156  WBC 11.2* 9.1  RBC 4.77 4.97  HGB 15.1 14.9  HCT 42.8 44.0  MCV 89.7 88.5  MCH 31.6 30.1  MCHC 35.2 34.0  RDW 14.2 14.4  PLT 153 154    Cardiac Enzymes Recent Labs  Lab 05/09/18 0003  TROPONINI <0.03   No results for input(s): TROPIPOC in the last 168 hours.   BNP Recent Labs  Lab 05/09/18 0344  BNP 290.0*     DDimer No results for input(s): DDIMER in the last 168 hours.   Radiology    Dg Chest 2 View  Result Date: 05/09/2018 CLINICAL DATA:  72 y/o  M; shortness of breath and chest tightness. EXAM: CHEST - 2 VIEW COMPARISON:  None. FINDINGS: Mild cardiomegaly given projection and technique. Diffuse reticular and  peripheral linear opacities. Small bilateral pleural effusions. No consolidation or pneumothorax. No acute osseous abnormality is evident. Mild discogenic degenerative changes of the thoracic spine. IMPRESSION: Interstitial pulmonary edema. Small bilateral pleural effusions. Mild cardiomegaly. Electronically Signed   By: Kristine Garbe M.D.   On: 05/09/2018 01:00   Ct Angio Chest Pe W And/or Wo Contrast  Result Date: 05/09/2018 CLINICAL DATA:  PE suspected, low pretest prob. Left-sided chest tightness and shortness of breath. Increased shortness of breath when lying flat. EXAM: CT ANGIOGRAPHY CHEST WITH CONTRAST TECHNIQUE: Multidetector CT imaging of the chest was performed using the standard protocol during bolus administration of intravenous contrast. Multiplanar CT image reconstructions and MIPs were obtained to evaluate the vascular anatomy. CONTRAST:  37mL  OMNIPAQUE IOHEXOL 350 MG/ML SOLN COMPARISON:  Chest radiograph earlier this day FINDINGS: Cardiovascular: There are no filling defects within the pulmonary arteries to suggest pulmonary embolus. Fusiform aneurysmal dilatation of the ascending thoracic aorta, maximal dimension 4.7 cm. Cannot assess for dissection given phase of contrast tailored for pulmonary artery evaluation. Aortic atherosclerosis. Multi chamber cardiomegaly. Coronary artery calcifications. Small pericardial effusion. Contrast refluxes into the IVC. Mediastinum/Nodes: Multiple small mediastinal nodes. No thyroid nodule. Esophagus is nondistended. Lungs/Pleura: Small to moderate bilateral pleural effusions with adjacent compressive atelectasis. Septal thickening consistent with pulmonary edema. Peribronchial thickening is likely congestive. No consolidation. No pulmonary mass. Scattered pulmonary cysts. Upper Abdomen: Tiny hepatic hypodensities are too small to accurately characterize. Small cyst in the posterior left kidney. No acute findings. Musculoskeletal: There are no acute or suspicious osseous abnormalities. Tiny peripherally sclerotic sclerotic densities within left lateral ribs are nonspecific but likely of no clinical significance. Review of the MIP images confirms the above findings. IMPRESSION: 1. No pulmonary embolus. 2. CHF with cardiomegaly, pulmonary edema, and bilateral pleural effusions. 3. Fusiform aneurysm of the ascending aorta, maximal dimension 4.7 cm. Recommend semi-annual imaging followup by CTA or MRA and referral to cardiothoracic surgery if not already obtained. This recommendation follows 2010 ACCF/AHA/AATS/ACR/ASA/SCA/SCAI/SIR/STS/SVM Guidelines for the Diagnosis and Management of Patients With Thoracic Aortic Disease. Circulation. 2010; 121: U235-T614 4. Aortic aneurysm NOS (ICD10-I71.9). Aortic Atherosclerosis (ICD10-I70.0). Electronically Signed   By: Keith Rake M.D.   On: 05/09/2018 04:22    Cardiac  Studies     Patient Profile     James [name] is a 72 y.o. male with a history of HTN, HL, OA, hypothyroidism, and PAF on flecainide and s/p recent dccv on 10/1, presenting to the hospital with  left chest pain concerning for unstable angina, new onset heart failure .  Assessment & Plan    1.  Acute diastolic congestive heart failure/hypertensive urgency:  S/p cardioversion in the setting of persistent atrial fibrillation on October 1.  Presenting with orthopnea ,  Fatigue,  left-sided chest discomfort.    CTA of the chest was negative for PE but notable for CHF and bilateral pleural effusions.   --- Cardiac catheterization right and left heart arranged by Dr. Fletcher Anon for Monday I have reviewed the risks, indications, and alternatives to cardiac catheterization, possible angioplasty, and stenting with the patient. Risks include but are not limited to bleeding, infection, vascular injury, stroke, myocardial infection, arrhythmia, kidney injury, radiation-related injury in the case of prolonged fluoroscopy use, emergency cardiac surgery, and death. The patient understands the risks of serious complication is 1-2 in 4315 with diagnostic cardiac cath and 1-2% or less with angioplasty/stenting.  We will check the schedule  2.  Persistent atrial fibrillation:  Flecainide initiated in September  status post cardioversion on October 1.   maintaining sinus rhythm.   Continue flecainide, beta-blocker  on  heparin in preparation for catheterization  3.  Hyperlipidemia:  On statin therapy.   LDL 73 last December.  4.  Ascending aortic aneurysm:  Maximal dimension 4.7 cm noted on CT  Will require annual monitoring with echo/CT   Total encounter time more than 25 minutes  Greater than 50% was spent in counseling and coordination of care with the patient   For questions or updates, please contact [facility] Please consult [email or web address] for contact info under        Signed, Ida Rogue, MD  05/10/2018, 3:48 PM

## 2018-05-10 NOTE — Progress Notes (Signed)
Pharmacy said to HOLD flecainide if heart rate is in the 40s or 50s.  James Knack, RN

## 2018-05-10 NOTE — Progress Notes (Signed)
[facility] [place] NAME: James [name]    MR#:  [id number]  DATE OF BIRTH:  1945-09-12  SUBJECTIVE:  CHIEF COMPLAINT:   Chief Complaint  Patient presents with  . Shortness of Breath   Came with chest tightness.  Feels well today.  No shortness of breath or chest pain.  Ambulating in the room without any problems. On heparin drip.  REVIEW OF SYSTEMS:  CONSTITUTIONAL: No fever, fatigue or weakness.  EYES: No blurred or double vision.  EARS, NOSE, AND THROAT: No tinnitus or ear pain.  RESPIRATORY: No cough, shortness of breath, wheezing or hemoptysis.  CARDIOVASCULAR: No chest pain, orthopnea, edema.  GASTROINTESTINAL: No nausea, vomiting, diarrhea or abdominal pain.  GENITOURINARY: No dysuria, hematuria.  ENDOCRINE: No polyuria, nocturia,  HEMATOLOGY: No anemia, easy bruising or bleeding SKIN: No rash or lesion. MUSCULOSKELETAL: No joint pain or arthritis.   NEUROLOGIC: No tingling, numbness, weakness.  PSYCHIATRY: No anxiety or depression.   ROS  DRUG ALLERGIES:   Allergies  Allergen Reactions  . Sulfonamide Derivatives Rash    VITALS:  Blood pressure (!) 150/82, pulse (!) 54, temperature 97.7 F (36.5 C), temperature source Oral, resp. rate 16, height 6' (1.829 m), weight 115.5 kg, SpO2 91 %.  PHYSICAL EXAMINATION:  GENERAL:  72 y.o.-year-old patient lying in the bed with no acute distress.  EYES: Pupils equal, round, reactive to light and accommodation. No scleral icterus. Extraocular muscles intact.  HEENT: Head atraumatic, normocephalic. Oropharynx and nasopharynx clear.  NECK:  Supple, no jugular venous distention. No thyroid enlargement, no tenderness.  LUNGS: Normal breath sounds bilaterally, no wheezing, some crepitation. No use of accessory muscles of respiration.  CARDIOVASCULAR: S1, S2 normal. No murmurs, rubs, or gallops.  ABDOMEN: Soft, nontender, nondistended. Bowel sounds present. No organomegaly or mass.   EXTREMITIES: No pedal edema, cyanosis, or clubbing.  NEUROLOGIC: Cranial nerves II through XII are intact. Muscle strength 5/5 in all extremities. Sensation intact. Gait not checked.  PSYCHIATRIC: The patient is alert and oriented x 3.  SKIN: No obvious rash, lesion, or ulcer.   Physical Exam LABORATORY PANEL:   CBC Recent Labs  Lab 05/10/18 0156  WBC 9.1  HGB 14.9  HCT 44.0  PLT 154   ------------------------------------------------------------------------------------------------------------------  Chemistries  Recent Labs  Lab 05/10/18 0156  NA 139  K 3.5  CL 99  CO2 28  GLUCOSE 97  BUN 17  CREATININE 1.36*  CALCIUM 8.9   ------------------------------------------------------------------------------------------------------------------  Cardiac Enzymes Recent Labs  Lab 05/09/18 0003  TROPONINI <0.03   ------------------------------------------------------------------------------------------------------------------  RADIOLOGY:  Dg Chest 2 View  Result Date: 05/09/2018 CLINICAL DATA:  72 y/o  M; shortness of breath and chest tightness. EXAM: CHEST - 2 VIEW COMPARISON:  None. FINDINGS: Mild cardiomegaly given projection and technique. Diffuse reticular and peripheral linear opacities. Small bilateral pleural effusions. No consolidation or pneumothorax. No acute osseous abnormality is evident. Mild discogenic degenerative changes of the thoracic spine. IMPRESSION: Interstitial pulmonary edema. Small bilateral pleural effusions. Mild cardiomegaly. Electronically Signed   By: Kristine Garbe M.D.   On: 05/09/2018 01:00   Ct Angio Chest Pe W And/or Wo Contrast  Result Date: 05/09/2018 CLINICAL DATA:  PE suspected, low pretest prob. Left-sided chest tightness and shortness of breath. Increased shortness of breath when lying flat. EXAM: CT ANGIOGRAPHY CHEST WITH CONTRAST TECHNIQUE: Multidetector CT imaging of the chest was performed using the standard protocol  during bolus administration of intravenous contrast. Multiplanar CT image reconstructions and MIPs  were obtained to evaluate the vascular anatomy. CONTRAST:  47mL OMNIPAQUE IOHEXOL 350 MG/ML SOLN COMPARISON:  Chest radiograph earlier this day FINDINGS: Cardiovascular: There are no filling defects within the pulmonary arteries to suggest pulmonary embolus. Fusiform aneurysmal dilatation of the ascending thoracic aorta, maximal dimension 4.7 cm. Cannot assess for dissection given phase of contrast tailored for pulmonary artery evaluation. Aortic atherosclerosis. Multi chamber cardiomegaly. Coronary artery calcifications. Small pericardial effusion. Contrast refluxes into the IVC. Mediastinum/Nodes: Multiple small mediastinal nodes. No thyroid nodule. Esophagus is nondistended. Lungs/Pleura: Small to moderate bilateral pleural effusions with adjacent compressive atelectasis. Septal thickening consistent with pulmonary edema. Peribronchial thickening is likely congestive. No consolidation. No pulmonary mass. Scattered pulmonary cysts. Upper Abdomen: Tiny hepatic hypodensities are too small to accurately characterize. Small cyst in the posterior left kidney. No acute findings. Musculoskeletal: There are no acute or suspicious osseous abnormalities. Tiny peripherally sclerotic sclerotic densities within left lateral ribs are nonspecific but likely of no clinical significance. Review of the MIP images confirms the above findings. IMPRESSION: 1. No pulmonary embolus. 2. CHF with cardiomegaly, pulmonary edema, and bilateral pleural effusions. 3. Fusiform aneurysm of the ascending aorta, maximal dimension 4.7 cm. Recommend semi-annual imaging followup by CTA or MRA and referral to cardiothoracic surgery if not already obtained. This recommendation follows 2010 ACCF/AHA/AATS/ACR/ASA/SCA/SCAI/SIR/STS/SVM Guidelines for the Diagnosis and Management of Patients With Thoracic Aortic Disease. Circulation. 2010; 121: W656-C127 4.  Aortic aneurysm NOS (ICD10-I71.9). Aortic Atherosclerosis (ICD10-I70.0). Electronically Signed   By: Keith Rake M.D.   On: 05/09/2018 04:22    ASSESSMENT AND PLAN:   Active Problems:   CHF (congestive heart failure) (HCC)  *Acute on chronic diastolic congestive heart failure.  Continue Lasix.  Diuresing well. Cardiac catheterization on Monday to evaluate for coronary artery disease and possible stent placement.  Heparin drip. Aspirin daily  * Hypertension:   Continue ACE inhibitor as well as metoprolol.   *  Hypothyroidism: Normal TSH.  Continue Synthroid.  *Paroxysmal atrial fibrillation.  Now in normal sinus rhythm.  Eliquis stopped and heparin drip started for cardiac catheterization on Monday.  *  BPH: Continue doxazosin   All the records are reviewed and case discussed with Care Management/Social Workerr. Management plans discussed with the patient, family and they are in agreement.  CODE STATUS: Full.  TOTAL TIME TAKING CARE OF THIS PATIENT: 35 minutes.    POSSIBLE D/C IN 1-2 DAYS, DEPENDING ON CLINICAL CONDITION.   Neita Carp M.D on 05/10/2018   Between 7am to 6pm - Pager - [phone]  After 6pm go to [email or web address] - password [email or web address] [facility]  Office  [phone]  CC: Primary care physician; Tonia Ghent, MD  Note: This dictation was prepared with Dragon dictation along with smaller phrase technology. Any transcriptional errors that result from this process are unintentional.

## 2018-05-11 DIAGNOSIS — I77810 Thoracic aortic ectasia: Secondary | ICD-10-CM

## 2018-05-11 LAB — CBC
HCT: 44.2 % (ref 40.0–52.0)
HEMOGLOBIN: 15 g/dL (ref 13.0–18.0)
MCH: 30.2 pg (ref 26.0–34.0)
MCHC: 33.9 g/dL (ref 32.0–36.0)
MCV: 89.2 fL (ref 80.0–100.0)
Platelets: 140 10*3/uL — ABNORMAL LOW (ref 150–440)
RBC: 4.96 MIL/uL (ref 4.40–5.90)
RDW: 14.3 % (ref 11.5–14.5)
WBC: 8.8 10*3/uL (ref 3.8–10.6)

## 2018-05-11 LAB — APTT
aPTT: 127 seconds — ABNORMAL HIGH (ref 24–36)
aPTT: 90 seconds — ABNORMAL HIGH (ref 24–36)
aPTT: 92 seconds — ABNORMAL HIGH (ref 24–36)

## 2018-05-11 LAB — HEPARIN LEVEL (UNFRACTIONATED): Heparin Unfractionated: 1.02 IU/mL — ABNORMAL HIGH (ref 0.30–0.70)

## 2018-05-11 MED ORDER — SODIUM CHLORIDE 0.9% FLUSH: 3.0000 mL | INTRAVENOUS | Status: DC | PRN

## 2018-05-11 MED ORDER — FUROSEMIDE 40 MG PO TABS: 40.0000 mg | ORAL_TABLET | Freq: Every day | ORAL | Status: DC

## 2018-05-11 MED ORDER — METOPROLOL SUCCINATE ER 25 MG PO TB24
25.0000 mg | ORAL_TABLET | Freq: Every day | ORAL | Status: DC
  Filled 2018-05-11: qty 1

## 2018-05-11 MED ORDER — RAMIPRIL 10 MG PO CAPS
10.0000 mg | ORAL_CAPSULE | Freq: Every day | ORAL | Status: DC
  Administered 2018-05-13 – 2018-05-14 (×2): 10 mg via ORAL
  Filled 2018-05-11 (×2): qty 1

## 2018-05-11 MED ORDER — ASPIRIN 81 MG PO CHEW
81.0000 mg | CHEWABLE_TABLET | ORAL | Status: AC
  Administered 2018-05-12: 81 mg via ORAL
  Filled 2018-05-11: qty 1

## 2018-05-11 MED ORDER — SODIUM CHLORIDE 0.9 % IV SOLN: 250.0000 mL | INTRAVENOUS | Status: DC | PRN

## 2018-05-11 MED ORDER — SODIUM CHLORIDE 0.9% FLUSH
3.0000 mL | Freq: Two times a day (BID) | INTRAVENOUS | Status: DC
  Administered 2018-05-11: 3 mL via INTRAVENOUS

## 2018-05-11 MED ORDER — SODIUM CHLORIDE 0.9 % IV SOLN
INTRAVENOUS | Status: DC
  Administered 2018-05-12: 06:00:00 via INTRAVENOUS

## 2018-05-11 NOTE — H&P (View-Only) (Signed)
Progress Note  Patient Name: James [name] Date of Encounter: 05/11/2018  Primary Cardiologist: Nelva Bush, MD   Subjective   Presented to the hospital with substernal chest tightness concerning for unstable angina No chest pain overnight Has not been ambulating very much, denies significant leg swelling, shortness of breath He received IV Lasix already this morning,    Inpatient Medications    Scheduled Meds: . amLODipine  5 mg Oral Daily  . docusate sodium  100 mg Oral BID  . doxazosin  4 mg Oral QPC breakfast  . finasteride  5 mg Oral Daily  . flecainide  100 mg Oral BID  . [START ON 05/13/2018] furosemide  40 mg Oral Daily  . levothyroxine  150 mcg Oral See admin instructions  . [START ON 05/12/2018] metoprolol succinate  25 mg Oral Daily  . nitroGLYCERIN  0.5 inch Topical Q6H  . psyllium  1 packet Oral Daily  . [START ON 05/13/2018] ramipril  10 mg Oral Daily  . simvastatin  40 mg Oral QHS   Continuous Infusions: . heparin 1,250 Units/hr (05/11/18 5852)   PRN Meds: acetaminophen **OR** acetaminophen, ondansetron **OR** ondansetron (ZOFRAN) IV   Vital Signs    Vitals:   05/10/18 2348 05/11/18 0416 05/11/18 0500 05/11/18 0737  BP: 111/76 126/82  131/75  Pulse: (!) 47 (!) 48  (!) 45  Resp:      Temp:  98.3 F (36.8 C)  98.3 F (36.8 C)  TempSrc:  Oral  Oral  SpO2:  94%  97%  Weight:   114.9 kg   Height:        Intake/Output Summary (Last 24 hours) at 05/11/2018 1448 Last data filed at 05/11/2018 1226 Gross per 24 hour  Intake -  Output 2325 ml  Net -2325 ml   Filed Weights   05/09/18 0547 05/10/18 0343 05/11/18 0500  Weight: 119.7 kg 115.5 kg 114.9 kg    Telemetry    Normal sinus rhythm- Personally Reviewed  ECG      Physical Exam   Constitutional:  oriented to person, place, and time. No distress. Obese HENT:  Head: Normocephalic and atraumatic.  Eyes:  no discharge. No scleral icterus.  Neck: Normal range of motion. Neck supple.  No JVD present.  Cardiovascular: Normal rate, regular rhythm, normal heart sounds and intact distal pulses. Exam reveals no gallop and no friction rub. No edema 2/6 systolic ejection murmur appreciated left sternal border Pulmonary/Chest: Effort normal and breath sounds normal. No stridor. No respiratory distress.  no wheezes.  no rales.  no tenderness.  Abdominal: Soft.  no distension.  no tenderness.  Musculoskeletal: Normal range of motion.  no  tenderness or deformity.  Neurological:  normal muscle tone. Coordination normal. No atrophy Skin: Skin is warm and dry. No rash noted. not diaphoretic.  Psychiatric:  normal mood and affect. behavior is normal. Thought content normal.    Labs    Chemistry Recent Labs  Lab 05/09/18 0003 05/10/18 0156  NA 138 139  K 4.0 3.5  CL 107 99  CO2 23 28  GLUCOSE 101* 97  BUN 16 17  CREATININE 1.22 1.36*  CALCIUM 9.1 8.9  GFRNONAA 58* 51*  GFRAA >60 59*  ANIONGAP 8 12     Hematology Recent Labs  Lab 05/09/18 0003 05/10/18 0156 05/11/18 0507  WBC 11.2* 9.1 8.8  RBC 4.77 4.97 4.96  HGB 15.1 14.9 15.0  HCT 42.8 44.0 44.2  MCV 89.7 88.5 89.2  MCH 31.6 30.1  30.2  MCHC 35.2 34.0 33.9  RDW 14.2 14.4 14.3  PLT 153 154 140*    Cardiac Enzymes Recent Labs  Lab 05/09/18 0003  TROPONINI <0.03   No results for input(s): TROPIPOC in the last 168 hours.   BNP Recent Labs  Lab 05/09/18 0344  BNP 290.0*     DDimer No results for input(s): DDIMER in the last 168 hours.   Radiology    No results found.  Cardiac Studies     Patient Profile     THADDIUS MANES is a 72 y.o. male with a history of HTN, HL, OA, hypothyroidism, and PAF on flecainide and s/p recent dccv on 10/1, presenting to the hospital with  left chest pain concerning for unstable angina, new onset heart failure .  Assessment & Plan    1.  Acute diastolic congestive heart failure/hypertensive urgency:  S/p cardioversion in the setting of persistent atrial  fibrillation on October 1.  Presenting with orthopnea ,  Fatigue,  left-sided chest discomfort.    CTA of the chest was negative for PE but notable for CHF and bilateral pleural effusions.   Evaluated by Dr. Fletcher Anon last week, plans made for cardiac catheterization unstable angina symptoms, heart failure on arrival On the schedule for cardiac catheterization 8:30 in the morning tomorrow  2.  Persistent atrial fibrillation:  Flecainide initiated in September   status post cardioversion on October 1.   maintaining sinus rhythm.   Continue flecainide, beta-blocker  on  heparin in preparation for catheterization We will discontinue heparin in the morning prior to cardiac catheterization  3.  Hyperlipidemia:  On statin therapy.   LDL 73 last December. Catheterization pending to help guide therapy  4.  Ascending aortic aneurysm:  Maximal dimension 4.7 cm noted on CT  This could be imaged tomorrow at the time of catheterization   Total encounter time more than 25 minutes  Greater than 50% was spent in counseling and coordination of care with the patient   For questions or updates, please contact [place] Please consult [email or web address] for contact info under        Signed, Ida Rogue, MD  05/11/2018, 2:48 PM

## 2018-05-11 NOTE — Progress Notes (Signed)
ANTICOAGULATION CONSULT NOTE - Initial Consult  Pharmacy Consult for Heparin Drip Indication: atrial fibrillation  Allergies  Allergen Reactions  . Sulfonamide Derivatives Rash    Patient Measurements: Height: 6' (182.9 cm) Weight: 253 lb 6.4 oz (114.9 kg) IBW/kg (Calculated) : 77.6 Heparin Dosing Weight: 103.8 kg  Vital Signs: Temp: 98.3 F (36.8 C) (10/06 0737) Temp Source: Oral (10/06 0737) BP: 131/75 (10/06 0737) Pulse Rate: 45 (10/06 0737)  Labs: Recent Labs    05/09/18 0003 05/09/18 0931  05/10/18 0156 05/11/18 0507 05/11/18 1308  HGB 15.1  --   --  14.9 15.0  --   HCT 42.8  --   --  44.0 44.2  --   PLT 153  --   --  154 140*  --   APTT  --  48*   < > 87* 127* 90*  LABPROT  --  16.2*  --   --   --   --   INR  --  1.31  --   --   --   --   HEPARINUNFRC  --  2.26*  --   --  1.02*  --   CREATININE 1.22  --   --  1.36*  --   --   TROPONINI <0.03  --   --   --   --   --    < > = values in this interval not displayed.    Estimated Creatinine Clearance: 66.1 mL/min (A) (by C-G formula based on SCr of 1.36 mg/dL (H)).   Medical History: Past Medical History:  Diagnosis Date  . Acute lower GI bleeding after colonoscopy and polypectomy, resolved 12/12/2015  . Cardiac murmur    a. 02/2018 Echo: EF 60-65%, no rwma, mild AI, mildly dil Ao root/Asc Ao/Arch, Mild MR, mildly dil LA. Nl RV fxn.  . Cataract    bilateral surgery to remove  . Colon polyp   . Constipation   . Dilated aortic root ([place])    a. 02/2018 Echo: mildly dil Ao root/asc ao/arch.  . Essential hypertension   . Hemorrhoids   . Hepatitis    self resolved, likely food exposure, 1974.   Marland Kitchen History of cardiovascular stress test    a. 02/2018 Myoview: EF 55-65%, small/mild apical defect w/ nl wall motion ->attenuation artifact. No ischemia. Low risk study.  Marland Kitchen Hx of adenomatous colonic polyps 12/05/2015  . Hyperlipidemia   . Hypothyroidism   . Osteoarthritis   . Persistent atrial fibrillation    a. Dx  02/2018. CHA2DS2VASc = 2-->Eliquis; b. 03/2018 DCCV (200J); c. 03/2018 recurrent Afib-->flecainide started 04/2018;  d. 05/06/2018 s/p successful DCCV - 200J x 2.  . Skin cancer    basal cell, R ear, MOHS    Medications:  Scheduled:  . amLODipine  5 mg Oral Daily  . docusate sodium  100 mg Oral BID  . doxazosin  4 mg Oral QPC breakfast  . finasteride  5 mg Oral Daily  . flecainide  100 mg Oral BID  . [START ON 05/13/2018] furosemide  40 mg Oral Daily  . levothyroxine  150 mcg Oral See admin instructions  . [START ON 05/12/2018] metoprolol succinate  25 mg Oral Daily  . nitroGLYCERIN  0.5 inch Topical Q6H  . psyllium  1 packet Oral Daily  . [START ON 05/13/2018] ramipril  10 mg Oral Daily  . simvastatin  40 mg Oral QHS   Infusions:  . heparin 1,250 Units/hr (05/11/18 0454)    Assessment: 72 yo  M to start Heparin Drip for Atrial Fibrillation. Patient on apixaban PTA. Last dose of apixban was taken ~ 2200 on 05/08/18. Hgb 15.1  Plt 153   INR 1.31  APTT 48 (drawn after drip started) HL 2.26  Goal of Therapy:  Heparin level 0.3-0.7 units/ml aPTT 66-102 seconds Monitor platelets by anticoagulation protocol: Yes   Plan:  Give 5000 units bolus x 1 Start heparin infusion at 1550 units/hr   10/04 @1536  aPTT >160.  Will hold for 2 hours and recheck aPTT prior to start.  When restarting plan to initiate at 1350 units/hour if aPTT has fallen to a level less than 102 seconds.  Continue to monitor H&H and platelets  Will check aPTTs for dose adjustment until Heparin level correlates with aPTT. Check Heparin once per day until correlates with aPTT.  1005 0200 aPTT 87. Continue current regimen. Recheck aPTT, heparin level and CBC with tomorrow (10/6) AM labs.  10/06 AM aPTT 127. Decrease to 1250 units/hr and recheck in 6 hours.  10/6 APTT @ 1308 resulted @ 90 which is wnl. Will recheck in 6 hours.   Larene Beach, PharmD 05/11/2018,2:30 PM

## 2018-05-11 NOTE — Progress Notes (Signed)
ANTICOAGULATION CONSULT NOTE - Initial Consult  Pharmacy Consult for Heparin Drip Indication: atrial fibrillation  Allergies  Allergen Reactions  . Sulfonamide Derivatives Rash    Patient Measurements: Height: 6' (182.9 cm) Weight: 253 lb 6.4 oz (114.9 kg) IBW/kg (Calculated) : 77.6 Heparin Dosing Weight: 103.8 kg  Vital Signs: Temp: 98 F (36.7 C) (10/06 1921) Temp Source: Oral (10/06 1921) BP: 109/67 (10/06 1921) Pulse Rate: 57 (10/06 1921)  Labs: Recent Labs    05/09/18 0003 05/09/18 0931  05/10/18 0156 05/11/18 0507 05/11/18 1308 05/11/18 1930  HGB 15.1  --   --  14.9 15.0  --   --   HCT 42.8  --   --  44.0 44.2  --   --   PLT 153  --   --  154 140*  --   --   APTT  --  48*   < > 87* 127* 90* 92*  LABPROT  --  16.2*  --   --   --   --   --   INR  --  1.31  --   --   --   --   --   HEPARINUNFRC  --  2.26*  --   --  1.02*  --   --   CREATININE 1.22  --   --  1.36*  --   --   --   TROPONINI <0.03  --   --   --   --   --   --    < > = values in this interval not displayed.    Estimated Creatinine Clearance: 66.1 mL/min (A) (by C-G formula based on SCr of 1.36 mg/dL (H)).   Medical History: Past Medical History:  Diagnosis Date  . Acute lower GI bleeding after colonoscopy and polypectomy, resolved 12/12/2015  . Cardiac murmur    a. 02/2018 Echo: EF 60-65%, no rwma, mild AI, mildly dil Ao root/Asc Ao/Arch, Mild MR, mildly dil LA. Nl RV fxn.  . Cataract    bilateral surgery to remove  . Colon polyp   . Constipation   . Dilated aortic root ([place])    a. 02/2018 Echo: mildly dil Ao root/asc ao/arch.  . Essential hypertension   . Hemorrhoids   . Hepatitis    self resolved, likely food exposure, 1974.   Marland Kitchen History of cardiovascular stress test    a. 02/2018 Myoview: EF 55-65%, small/mild apical defect w/ nl wall motion ->attenuation artifact. No ischemia. Low risk study.  Marland Kitchen Hx of adenomatous colonic polyps 12/05/2015  . Hyperlipidemia   . Hypothyroidism   .  Osteoarthritis   . Persistent atrial fibrillation    a. Dx 02/2018. CHA2DS2VASc = 2-->Eliquis; b. 03/2018 DCCV (200J); c. 03/2018 recurrent Afib-->flecainide started 04/2018;  d. 05/06/2018 s/p successful DCCV - 200J x 2.  . Skin cancer    basal cell, R ear, MOHS    Medications:  Scheduled:  . amLODipine  5 mg Oral Daily  . [START ON 05/12/2018] aspirin  81 mg Oral Pre-Cath  . docusate sodium  100 mg Oral BID  . doxazosin  4 mg Oral QPC breakfast  . finasteride  5 mg Oral Daily  . flecainide  100 mg Oral BID  . [START ON 05/13/2018] furosemide  40 mg Oral Daily  . levothyroxine  150 mcg Oral See admin instructions  . [START ON 05/12/2018] metoprolol succinate  25 mg Oral Daily  . nitroGLYCERIN  0.5 inch Topical Q6H  . psyllium  1  packet Oral Daily  . [START ON 05/13/2018] ramipril  10 mg Oral Daily  . simvastatin  40 mg Oral QHS  . sodium chloride flush  3 mL Intravenous Q12H   Infusions:  . sodium chloride    . [START ON 05/12/2018] sodium chloride    . heparin 1,250 Units/hr (05/11/18 5035)    Assessment: 72 yo M to start Heparin Drip for Atrial Fibrillation. Patient on apixaban PTA. Last dose of apixban was taken ~ 2200 on 05/08/18. Hgb 15.1  Plt 153   INR 1.31  APTT 48 (drawn after drip started) HL 2.26  Goal of Therapy:  Heparin level 0.3-0.7 units/ml aPTT 66-102 seconds Monitor platelets by anticoagulation protocol: Yes   Plan:  Give 5000 units bolus x 1 Start heparin infusion at 1550 units/hr   10/04 @1536  aPTT >160.  Will hold for 2 hours and recheck aPTT prior to start.  When restarting plan to initiate at 1350 units/hour if aPTT has fallen to a level less than 102 seconds.  Continue to monitor H&H and platelets  Will check aPTTs for dose adjustment until Heparin level correlates with aPTT. Check Heparin once per day until correlates with aPTT.  1005 0200 aPTT 87. Continue current regimen. Recheck aPTT, heparin level and CBC with tomorrow (10/6) AM labs.  10/06 AM  aPTT 127. Decrease to 1250 units/hr and recheck in 6 hours.  10/6 APTT @ 1308 resulted @ 90 which is wnl. Will recheck in 6 hours.  10/6 aPPT @ 1930 resulted in @ 92 which is WNL.  Will recheck with morning labs now.  Forrest Moron, PharmD 05/11/2018,8:18 PM

## 2018-05-11 NOTE — Progress Notes (Signed)
Progress Note  Patient Name: James [name] Date of Encounter: 05/11/2018  Primary Cardiologist: Nelva Bush, MD   Subjective   Presented to the hospital with substernal chest tightness concerning for unstable angina No chest pain overnight Has not been ambulating very much, denies significant leg swelling, shortness of breath He received IV Lasix already this morning,    Inpatient Medications    Scheduled Meds: . amLODipine  5 mg Oral Daily  . docusate sodium  100 mg Oral BID  . doxazosin  4 mg Oral QPC breakfast  . finasteride  5 mg Oral Daily  . flecainide  100 mg Oral BID  . [START ON 05/13/2018] furosemide  40 mg Oral Daily  . levothyroxine  150 mcg Oral See admin instructions  . [START ON 05/12/2018] metoprolol succinate  25 mg Oral Daily  . nitroGLYCERIN  0.5 inch Topical Q6H  . psyllium  1 packet Oral Daily  . [START ON 05/13/2018] ramipril  10 mg Oral Daily  . simvastatin  40 mg Oral QHS   Continuous Infusions: . heparin 1,250 Units/hr (05/11/18 8563)   PRN Meds: acetaminophen **OR** acetaminophen, ondansetron **OR** ondansetron (ZOFRAN) IV   Vital Signs    Vitals:   05/10/18 2348 05/11/18 0416 05/11/18 0500 05/11/18 0737  BP: 111/76 126/82  131/75  Pulse: (!) 47 (!) 48  (!) 45  Resp:      Temp:  98.3 F (36.8 C)  98.3 F (36.8 C)  TempSrc:  Oral  Oral  SpO2:  94%  97%  Weight:   114.9 kg   Height:        Intake/Output Summary (Last 24 hours) at 05/11/2018 1448 Last data filed at 05/11/2018 1226 Gross per 24 hour  Intake -  Output 2325 ml  Net -2325 ml   Filed Weights   05/09/18 0547 05/10/18 0343 05/11/18 0500  Weight: 119.7 kg 115.5 kg 114.9 kg    Telemetry    Normal sinus rhythm- Personally Reviewed  ECG      Physical Exam   Constitutional:  oriented to person, place, and time. No distress. Obese HENT:  Head: Normocephalic and atraumatic.  Eyes:  no discharge. No scleral icterus.  Neck: Normal range of motion. Neck supple.  No JVD present.  Cardiovascular: Normal rate, regular rhythm, normal heart sounds and intact distal pulses. Exam reveals no gallop and no friction rub. No edema 2/6 systolic ejection murmur appreciated left sternal border Pulmonary/Chest: Effort normal and breath sounds normal. No stridor. No respiratory distress.  no wheezes.  no rales.  no tenderness.  Abdominal: Soft.  no distension.  no tenderness.  Musculoskeletal: Normal range of motion.  no  tenderness or deformity.  Neurological:  normal muscle tone. Coordination normal. No atrophy Skin: Skin is warm and dry. No rash noted. not diaphoretic.  Psychiatric:  normal mood and affect. behavior is normal. Thought content normal.    Labs    Chemistry Recent Labs  Lab 05/09/18 0003 05/10/18 0156  NA 138 139  K 4.0 3.5  CL 107 99  CO2 23 28  GLUCOSE 101* 97  BUN 16 17  CREATININE 1.22 1.36*  CALCIUM 9.1 8.9  GFRNONAA 58* 51*  GFRAA >60 59*  ANIONGAP 8 12     Hematology Recent Labs  Lab 05/09/18 0003 05/10/18 0156 05/11/18 0507  WBC 11.2* 9.1 8.8  RBC 4.77 4.97 4.96  HGB 15.1 14.9 15.0  HCT 42.8 44.0 44.2  MCV 89.7 88.5 89.2  MCH 31.6 30.1  30.2  MCHC 35.2 34.0 33.9  RDW 14.2 14.4 14.3  PLT 153 154 140*    Cardiac Enzymes Recent Labs  Lab 05/09/18 0003  TROPONINI <0.03   No results for input(s): TROPIPOC in the last 168 hours.   BNP Recent Labs  Lab 05/09/18 0344  BNP 290.0*     DDimer No results for input(s): DDIMER in the last 168 hours.   Radiology    No results found.  Cardiac Studies     Patient Profile     James [name] is a 72 y.o. male with a history of HTN, HL, OA, hypothyroidism, and PAF on flecainide and s/p recent dccv on 10/1, presenting to the hospital with  left chest pain concerning for unstable angina, new onset heart failure .  Assessment & Plan    1.  Acute diastolic congestive heart failure/hypertensive urgency:  S/p cardioversion in the setting of persistent atrial  fibrillation on October 1.  Presenting with orthopnea ,  Fatigue,  left-sided chest discomfort.    CTA of the chest was negative for PE but notable for CHF and bilateral pleural effusions.   Evaluated by Dr. Fletcher Anon last week, plans made for cardiac catheterization unstable angina symptoms, heart failure on arrival On the schedule for cardiac catheterization 8:30 in the morning tomorrow  2.  Persistent atrial fibrillation:  Flecainide initiated in September   status post cardioversion on October 1.   maintaining sinus rhythm.   Continue flecainide, beta-blocker  on  heparin in preparation for catheterization We will discontinue heparin in the morning prior to cardiac catheterization  3.  Hyperlipidemia:  On statin therapy.   LDL 73 last December. Catheterization pending to help guide therapy  4.  Ascending aortic aneurysm:  Maximal dimension 4.7 cm noted on CT  This could be imaged tomorrow at the time of catheterization   Total encounter time more than 25 minutes  Greater than 50% was spent in counseling and coordination of care with the patient   For questions or updates, please contact [place] Please consult [email or web address] for contact info under        Signed, Ida Rogue, MD  05/11/2018, 2:48 PM

## 2018-05-11 NOTE — Progress Notes (Signed)
ANTICOAGULATION CONSULT NOTE - Initial Consult  Pharmacy Consult for Heparin Drip Indication: atrial fibrillation  Allergies  Allergen Reactions  . Sulfonamide Derivatives Rash    Patient Measurements: Height: 6' (182.9 cm) Weight: 253 lb 6.4 oz (114.9 kg) IBW/kg (Calculated) : 77.6 Heparin Dosing Weight: 103.8 kg  Vital Signs: Temp: 98.3 F (36.8 C) (10/06 0416) Temp Source: Oral (10/06 0416) BP: 126/82 (10/06 0416) Pulse Rate: 48 (10/06 0416)  Labs: Recent Labs    05/09/18 0003 05/09/18 0931  05/09/18 1937 05/10/18 0156 05/11/18 0507  HGB 15.1  --   --   --  14.9 15.0  HCT 42.8  --   --   --  44.0 44.2  PLT 153  --   --   --  154 140*  APTT  --  48*   < > 47* 87* 127*  LABPROT  --  16.2*  --   --   --   --   INR  --  1.31  --   --   --   --   HEPARINUNFRC  --  2.26*  --   --   --  1.02*  CREATININE 1.22  --   --   --  1.36*  --   TROPONINI <0.03  --   --   --   --   --    < > = values in this interval not displayed.    Estimated Creatinine Clearance: 66.1 mL/min (A) (by C-G formula based on SCr of 1.36 mg/dL (H)).   Medical History: Past Medical History:  Diagnosis Date  . Acute lower GI bleeding after colonoscopy and polypectomy, resolved 12/12/2015  . Cardiac murmur    a. 02/2018 Echo: EF 60-65%, no rwma, mild AI, mildly dil Ao root/Asc Ao/Arch, Mild MR, mildly dil LA. Nl RV fxn.  . Cataract    bilateral surgery to remove  . Colon polyp   . Constipation   . Dilated aortic root ([place])    a. 02/2018 Echo: mildly dil Ao root/asc ao/arch.  . Essential hypertension   . Hemorrhoids   . Hepatitis    self resolved, likely food exposure, 1974.   Marland Kitchen History of cardiovascular stress test    a. 02/2018 Myoview: EF 55-65%, small/mild apical defect w/ nl wall motion ->attenuation artifact. No ischemia. Low risk study.  Marland Kitchen Hx of adenomatous colonic polyps 12/05/2015  . Hyperlipidemia   . Hypothyroidism   . Osteoarthritis   . Persistent atrial fibrillation    a. Dx  02/2018. CHA2DS2VASc = 2-->Eliquis; b. 03/2018 DCCV (200J); c. 03/2018 recurrent Afib-->flecainide started 04/2018;  d. 05/06/2018 s/p successful DCCV - 200J x 2.  . Skin cancer    basal cell, R ear, MOHS    Medications:  Scheduled:  . amLODipine  5 mg Oral Daily  . docusate sodium  100 mg Oral BID  . doxazosin  4 mg Oral QPC breakfast  . finasteride  5 mg Oral Daily  . flecainide  100 mg Oral BID  . furosemide  40 mg Intravenous BID  . levothyroxine  150 mcg Oral See admin instructions  . metoprolol succinate  50 mg Oral Daily  . nitroGLYCERIN  0.5 inch Topical Q6H  . psyllium  1 packet Oral Daily  . ramipril  10 mg Oral Daily  . simvastatin  40 mg Oral QHS   Infusions:  . heparin 1,350 Units/hr (05/10/18 2039)    Assessment: 72 yo M to start Heparin Drip for Atrial Fibrillation. Patient  on apixaban PTA. Last dose of apixban was taken ~ 2200 on 05/08/18. Hgb 15.1  Plt 153   INR 1.31  APTT 48 (drawn after drip started) HL 2.26  Goal of Therapy:  Heparin level 0.3-0.7 units/ml aPTT 66-102 seconds Monitor platelets by anticoagulation protocol: Yes   Plan:  Give 5000 units bolus x 1 Start heparin infusion at 1550 units/hr   10/04 @1536  aPTT >160.  Will hold for 2 hours and recheck aPTT prior to start.  When restarting plan to initiate at 1350 units/hour if aPTT has fallen to a level less than 102 seconds.  Continue to monitor H&H and platelets  Will check aPTTs for dose adjustment until Heparin level correlates with aPTT. Check Heparin once per day until correlates with aPTT.  1005 0200 aPTT 87. Continue current regimen. Recheck aPTT, heparin level and CBC with tomorrow (10/6) AM labs.  10/06 AM aPTT 127. Decrease to 1250 units/hr and recheck in 6 hours.  Ronny Ruddell S, PharmD 05/11/2018,6:27 AM

## 2018-05-11 NOTE — Progress Notes (Signed)
[facility] [place] NAME: Tiffany Talarico    MR#:  [id number]  DATE OF BIRTH:  [birth year]  SUBJECTIVE:  CHIEF COMPLAINT:   Chief Complaint  Patient presents with  . Shortness of Breath   Came with chest tightness.  Feels well today. Waiting for catheterization.  REVIEW OF SYSTEMS:  CONSTITUTIONAL: No fever, fatigue or weakness.  EYES: No blurred or double vision.  EARS, NOSE, AND THROAT: No tinnitus or ear pain.  RESPIRATORY: No cough, shortness of breath, wheezing or hemoptysis.  CARDIOVASCULAR: No chest pain, orthopnea, edema.  GASTROINTESTINAL: No nausea, vomiting, diarrhea or abdominal pain.  GENITOURINARY: No dysuria, hematuria.  ENDOCRINE: No polyuria, nocturia,  HEMATOLOGY: No anemia, easy bruising or bleeding SKIN: No rash or lesion. MUSCULOSKELETAL: No joint pain or arthritis.   NEUROLOGIC: No tingling, numbness, weakness.  PSYCHIATRY: No anxiety or depression.   ROS  DRUG ALLERGIES:   Allergies  Allergen Reactions  . Sulfonamide Derivatives Rash    VITALS:  Blood pressure 131/75, pulse (!) 45, temperature 98.3 F (36.8 C), temperature source Oral, resp. rate 20, height 6' (1.829 m), weight 114.9 kg, SpO2 97 %.  PHYSICAL EXAMINATION:  GENERAL:  72 y.o.-year-old patient lying in the bed with no acute distress.  EYES: Pupils equal, round, reactive to light and accommodation. No scleral icterus. Extraocular muscles intact.  HEENT: Head atraumatic, normocephalic. Oropharynx and nasopharynx clear.  NECK:  Supple, no jugular venous distention. No thyroid enlargement, no tenderness.  LUNGS: Normal breath sounds bilaterally, no wheezing, some crepitation. No use of accessory muscles of respiration.  CARDIOVASCULAR: S1, S2 normal. No murmurs, rubs, or gallops.  ABDOMEN: Soft, nontender, nondistended. Bowel sounds present. No organomegaly or mass.  EXTREMITIES: No pedal edema, cyanosis, or clubbing.  NEUROLOGIC: Cranial  nerves II through XII are intact. Muscle strength 5/5 in all extremities. Sensation intact. Gait not checked.  PSYCHIATRIC: The patient is alert and oriented x 3.  SKIN: No obvious rash, lesion, or ulcer.   Physical Exam LABORATORY PANEL:   CBC Recent Labs  Lab 05/11/18 0507  WBC 8.8  HGB 15.0  HCT 44.2  PLT 140*   ------------------------------------------------------------------------------------------------------------------  Chemistries  Recent Labs  Lab 05/10/18 0156  NA 139  K 3.5  CL 99  CO2 28  GLUCOSE 97  BUN 17  CREATININE 1.36*  CALCIUM 8.9   ------------------------------------------------------------------------------------------------------------------  Cardiac Enzymes Recent Labs  Lab 05/09/18 0003  TROPONINI <0.03   ------------------------------------------------------------------------------------------------------------------  RADIOLOGY:  No results found.  ASSESSMENT AND PLAN:   Active Problems:   CHF (congestive heart failure) (HCC)  *Acute on chronic diastolic congestive heart failure.   Diuresed well No crackles today and no lower extremity edema.  Will change to oral Lasix. Cardiac catheterization on Monday to evaluate for coronary artery disease and possible stent placement.  Heparin drip. Aspirin daily  * Hypertension:   Continue ACE inhibitor as well as metoprolol.   *  Hypothyroidism: Normal TSH.  Continue Synthroid.  *Paroxysmal atrial fibrillation.  Now in normal sinus rhythm.  Eliquis stopped and heparin drip started for cardiac catheterization on Monday.  *  BPH: Continue doxazosin   All the records are reviewed and case discussed with Care Management/Social Workerr. Management plans discussed with the patient, family and they are in agreement.  CODE STATUS: Full.  TOTAL TIME TAKING CARE OF THIS PATIENT: 35 minutes.   POSSIBLE D/C IN 1-2 DAYS, DEPENDING ON CLINICAL CONDITION.  Neita Carp M.D on 05/11/2018  Between 7am to 6pm - Pager - [phone]  After 6pm go to [email or web address] - password [email or web address] [facility]  Office  [phone]  CC: Primary care physician; Tonia Ghent, MD  Note: This dictation was prepared with Dragon dictation along with smaller phrase technology. Any transcriptional errors that result from this process are unintentional.

## 2018-05-12 ENCOUNTER — Encounter: Payer: Self-pay | Admitting: Cardiovascular Disease

## 2018-05-12 ENCOUNTER — Encounter
Admission: EM | Disposition: A | Discharge status: Home / Self Care | Payer: Self-pay | Source: Home / Self Care | Attending: Internal Medicine

## 2018-05-12 DIAGNOSIS — I509 Heart failure, unspecified: Secondary | ICD-10-CM

## 2018-05-12 HISTORY — PX: RIGHT/LEFT HEART CATH AND CORONARY ANGIOGRAPHY: CATH118266

## 2018-05-12 LAB — HEPARIN LEVEL (UNFRACTIONATED): HEPARIN UNFRACTIONATED: 0.82 [IU]/mL — AB (ref 0.30–0.70)

## 2018-05-12 LAB — APTT: APTT: 116 s — AB (ref 24–36)

## 2018-05-12 SURGERY — RIGHT/LEFT HEART CATH AND CORONARY ANGIOGRAPHY
Anesthesia: Moderate Sedation

## 2018-05-12 MED ORDER — IOPAMIDOL (ISOVUE-300) INJECTION 61%
INTRAVENOUS | Status: DC | PRN
  Administered 2018-05-12: 60 mL via INTRA_ARTERIAL

## 2018-05-12 MED ORDER — VERAPAMIL HCL 2.5 MG/ML IV SOLN
INTRAVENOUS | Status: DC | PRN
  Administered 2018-05-12: 2.5 mg via INTRA_ARTERIAL

## 2018-05-12 MED ORDER — HEPARIN (PORCINE) IN NACL 100-0.45 UNIT/ML-% IJ SOLN: 1250.0000 [IU]/h | INTRAMUSCULAR | Status: DC

## 2018-05-12 MED ORDER — FUROSEMIDE 20 MG PO TABS
20.0000 mg | ORAL_TABLET | Freq: Every day | ORAL | Status: DC
  Administered 2018-05-13 – 2018-05-14 (×2): 20 mg via ORAL
  Filled 2018-05-12 (×2): qty 1

## 2018-05-12 MED ORDER — HEPARIN (PORCINE) IN NACL 2000-0.9 UNIT/L-% IV SOLN: INTRAVENOUS | Status: DC | PRN

## 2018-05-12 MED ORDER — VERAPAMIL HCL 2.5 MG/ML IV SOLN
INTRAVENOUS | Status: AC
  Filled 2018-05-12: qty 2

## 2018-05-12 MED ORDER — SODIUM CHLORIDE 0.9 % IV SOLN: INTRAVENOUS | Status: AC

## 2018-05-12 MED ORDER — CLOPIDOGREL BISULFATE 75 MG PO TABS
300.0000 mg | ORAL_TABLET | Freq: Once | ORAL | Status: AC
  Administered 2018-05-12: 300 mg via ORAL
  Filled 2018-05-12: qty 4

## 2018-05-12 MED ORDER — FENTANYL CITRATE (PF) 100 MCG/2ML IJ SOLN
INTRAMUSCULAR | Status: DC | PRN
  Administered 2018-05-12: 25 ug via INTRAVENOUS
  Administered 2018-05-12: 50 ug via INTRAVENOUS

## 2018-05-12 MED ORDER — MIDAZOLAM HCL 2 MG/2ML IJ SOLN
INTRAMUSCULAR | Status: AC
  Filled 2018-05-12: qty 2

## 2018-05-12 MED ORDER — FENTANYL CITRATE (PF) 100 MCG/2ML IJ SOLN
INTRAMUSCULAR | Status: AC
  Filled 2018-05-12: qty 2

## 2018-05-12 MED ORDER — SODIUM CHLORIDE 0.9% FLUSH
3.0000 mL | Freq: Two times a day (BID) | INTRAVENOUS | Status: DC
  Administered 2018-05-12 – 2018-05-14 (×2): 3 mL via INTRAVENOUS

## 2018-05-12 MED ORDER — MIDAZOLAM HCL 2 MG/2ML IJ SOLN
INTRAMUSCULAR | Status: DC | PRN
  Administered 2018-05-12 (×2): 1 mg via INTRAVENOUS

## 2018-05-12 MED ORDER — HEPARIN (PORCINE) IN NACL 1000-0.9 UT/500ML-% IV SOLN
INTRAVENOUS | Status: AC
  Filled 2018-05-12: qty 1000

## 2018-05-12 MED ORDER — SODIUM CHLORIDE 0.9 % IV SOLN
250.0000 mL | INTRAVENOUS | Status: DC | PRN
  Administered 2018-05-13: 1000 mL via INTRAVENOUS

## 2018-05-12 MED ORDER — SODIUM CHLORIDE 0.9% FLUSH: 3.0000 mL | INTRAVENOUS | Status: DC | PRN

## 2018-05-12 MED ORDER — HEPARIN (PORCINE) IN NACL 100-0.45 UNIT/ML-% IJ SOLN
1200.0000 [IU]/h | INTRAMUSCULAR | Status: DC
  Administered 2018-05-12 – 2018-05-13 (×2): 1200 [IU]/h via INTRAVENOUS
  Filled 2018-05-12: qty 250

## 2018-05-12 MED ORDER — HEPARIN SODIUM (PORCINE) 1000 UNIT/ML IJ SOLN
INTRAMUSCULAR | Status: AC
  Filled 2018-05-12: qty 1

## 2018-05-12 MED ORDER — CLOPIDOGREL BISULFATE 75 MG PO TABS
75.0000 mg | ORAL_TABLET | Freq: Every day | ORAL | Status: DC
  Administered 2018-05-13 – 2018-05-14 (×2): 75 mg via ORAL
  Filled 2018-05-12 (×2): qty 1

## 2018-05-12 MED ORDER — HEPARIN SODIUM (PORCINE) 1000 UNIT/ML IJ SOLN
INTRAMUSCULAR | Status: DC | PRN
  Administered 2018-05-12: 6000 [IU] via INTRAVENOUS

## 2018-05-12 SURGICAL SUPPLY — 22 items
CATH INFINITI 5 FR MPA2 (CATHETERS) ×1 IMPLANT
CATH INFINITI 5FR JK (CATHETERS) ×2 IMPLANT
CATH INFINITI 5FR JL4 (CATHETERS) ×1 IMPLANT
CATH INFINITI JR4 5F (CATHETERS) ×1 IMPLANT
CATH SWANZ 7F THERMO (CATHETERS) ×1 IMPLANT
DEVICE RAD TR BAND REGULAR (VASCULAR PRODUCTS) ×2 IMPLANT
GLIDESHEATH SLEND SS 6F .021 (SHEATH) ×1 IMPLANT
GUIDEWIRE .025 260CM (WIRE) ×2 IMPLANT
GUIDEWIRE EMER 3M J .025X150CM (WIRE) ×1 IMPLANT
KIT MANI 3VAL PERCEP (MISCELLANEOUS) ×2 IMPLANT
KIT RIGHT HEART (MISCELLANEOUS) ×2 IMPLANT
NDL PERC 18GX7CM (NEEDLE) IMPLANT
NEEDLE PERC 18GX7CM (NEEDLE) ×2 IMPLANT
PACK CARDIAC CATH (CUSTOM PROCEDURE TRAY) ×2 IMPLANT
SET INTRO CAPELLA COAXIAL (SET/KITS/TRAYS/PACK) ×2 IMPLANT
SHEATH AVANTI 5FR X 11CM (SHEATH) ×1 IMPLANT
SHEATH AVANTI 7FRX11 (SHEATH) ×1 IMPLANT
SHEATH GLIDE SLENDER 4/5FR (SHEATH) IMPLANT
WIRE GUIDERIGHT .035X150 (WIRE) ×1 IMPLANT
WIRE HITORQ VERSACORE ST 145CM (WIRE) ×1 IMPLANT
WIRE ROSEN-J .035X260CM (WIRE) ×1 IMPLANT
WIRE RUNTHROUGH .014X180CM (WIRE) ×1 IMPLANT

## 2018-05-12 NOTE — Plan of Care (Signed)
  Problem: Coping: Goal: Level of anxiety will decrease Outcome: Progressing   Problem: Elimination: Goal: Will not experience complications related to bowel motility Outcome: Progressing Goal: Will not experience complications related to urinary retention Outcome: Progressing   Problem: Safety: Goal: Ability to remain free from injury will improve Outcome: Progressing   Problem: Skin Integrity: Goal: Risk for impaired skin integrity will decrease Outcome: Progressing   Problem: Activity: Goal: Capacity to carry out activities will improve Outcome: Progressing   Problem: Cardiac: Goal: Ability to achieve and maintain adequate cardiopulmonary perfusion will improve Outcome: Progressing

## 2018-05-12 NOTE — Progress Notes (Signed)
[facility] for Heparin Drip Indication: atrial fibrillation  Allergies  Allergen Reactions  . Sulfonamide Derivatives Rash    Patient Measurements: Height: 6' (182.9 cm) Weight: 251 lb 12.8 oz (114.2 kg) IBW/kg (Calculated) : 77.6 Heparin Dosing Weight: 103.8 kg  Vital Signs: Temp: 98.2 F (36.8 C) (10/07 1223) Temp Source: Oral (10/07 1223) BP: 152/91 (10/07 1223) Pulse Rate: 48 (10/07 1223)  Labs: Recent Labs    05/10/18 0156 05/11/18 0507 05/11/18 1308 05/11/18 1930 05/12/18 0510  HGB 14.9 15.0  --   --   --   HCT 44.0 44.2  --   --   --   PLT 154 140*  --   --   --   APTT 87* 127* 90* 92* 116*  HEPARINUNFRC  --  1.02*  --   --  0.82*  CREATININE 1.36*  --   --   --   --     Estimated Creatinine Clearance: 65.9 mL/min (A) (by C-G formula based on SCr of 1.36 mg/dL (H)).   Medical History: Past Medical History:  Diagnosis Date  . Acute lower GI bleeding after colonoscopy and polypectomy, resolved 12/12/2015  . Cardiac murmur    a. 02/2018 Echo: EF 60-65%, no rwma, mild AI, mildly dil Ao root/Asc Ao/Arch, Mild MR, mildly dil LA. Nl RV fxn.  . Cataract    bilateral surgery to remove  . Colon polyp   . Constipation   . Dilated aortic root ([place])    a. 02/2018 Echo: mildly dil Ao root/asc ao/arch.  . Essential hypertension   . Hemorrhoids   . Hepatitis    self resolved, likely food exposure, 1974.   Marland Kitchen History of cardiovascular stress test    a. 02/2018 Myoview: EF 55-65%, small/mild apical defect w/ nl wall motion ->attenuation artifact. No ischemia. Low risk study.  Marland Kitchen Hx of adenomatous colonic polyps 12/05/2015  . Hyperlipidemia   . Hypothyroidism   . Osteoarthritis   . Persistent atrial fibrillation    a. Dx 02/2018. CHA2DS2VASc = 2-->Eliquis; b. 03/2018 DCCV (200J); c. 03/2018 recurrent Afib-->flecainide started 04/2018;  d. 05/06/2018 s/p successful DCCV - 200J x 2.  . Skin cancer    basal cell, R ear, MOHS     Medications:  Scheduled:  . amLODipine  5 mg Oral Daily  . docusate sodium  100 mg Oral BID  . doxazosin  4 mg Oral QPC breakfast  . finasteride  5 mg Oral Daily  . [START ON 05/13/2018] furosemide  20 mg Oral Daily  . levothyroxine  150 mcg Oral See admin instructions  . metoprolol succinate  25 mg Oral Daily  . nitroGLYCERIN  0.5 inch Topical Q6H  . psyllium  1 packet Oral Daily  . [START ON 05/13/2018] ramipril  10 mg Oral Daily  . simvastatin  40 mg Oral QHS  . sodium chloride flush  3 mL Intravenous Q12H   Infusions:  . sodium chloride 75 mL/hr at 05/12/18 1027  . sodium chloride    . heparin      Assessment: 72 yo M to start Heparin Drip for Atrial Fibrillation. Patient on apixaban PTA. Last dose of apixban was taken ~ 2200 on 05/08/18. Hgb 15.1  Plt 153   INR 1.31  APTT 48 (drawn after drip started) HL 2.26  Goal of Therapy:  Heparin level 0.3-0.7 units/ml aPTT 66-102 seconds Monitor platelets by anticoagulation protocol: Yes   Plan:  Give 5000 units bolus x 1 Start  heparin infusion at 1550 units/hr   10/04 @1536  aPTT >160.  Will hold for 2 hours and recheck aPTT prior to start.  When restarting plan to initiate at 1350 units/hour if aPTT has fallen to a level less than 102 seconds.  Continue to monitor H&H and platelets  Will check aPTTs for dose adjustment until Heparin level correlates with aPTT. Check Heparin once per day until correlates with aPTT.  1005 0200 aPTT 87. Continue current regimen. Recheck aPTT, heparin level and CBC with tomorrow (10/6) AM labs.  10/06 AM aPTT 127. Decrease to 1250 units/hr and recheck in 6 hours.  10/6 APTT @ 1308 resulted @ 90 which is wnl. Will recheck in 6 hours.  10/6 aPPT @ 1930 resulted in @ 92 which is WNL.  Will recheck with morning labs now.  10/7  Patient to Cath Lab this am. Heparin Drip to be restarted 8 hours after Sheath pulled with no bolus per consult. Sheath noted to be removed @ 0951 per procedure notes.  Will restart @ 1800.   Labs drawn this am before pt went to Cath Lab:  aPTT 116, HL 0.82.   Will restart Heparin Drip at a slightly lower rate of 1200 units/hr. Will check aPTT and HL 8 hours after restart of drip. IF aPTT and HL correlate then will continue with HL going forward.  Noma Quijas A, PharmD 05/12/2018,1:18 PM

## 2018-05-12 NOTE — Progress Notes (Signed)
Pt sitting up eating lunch, groin site looks good, no issues noted at this time.

## 2018-05-12 NOTE — Progress Notes (Signed)
[facility] NAME: Kaden Daughdrill    MR#:  [id number]  DATE OF BIRTH:  October 13, 1945  SUBJECTIVE:   Just got back from cardiac cath. Denies any chest pain or shortness of breath. REVIEW OF SYSTEMS:   Review of Systems  Constitutional: Negative for chills, fever and weight loss.  HENT: Negative for ear discharge, ear pain and nosebleeds.   Eyes: Negative for blurred vision, pain and discharge.  Respiratory: Negative for sputum production, shortness of breath, wheezing and stridor.   Cardiovascular: Negative for chest pain, palpitations, orthopnea and PND.  Gastrointestinal: Negative for abdominal pain, diarrhea, nausea and vomiting.  Genitourinary: Negative for frequency and urgency.  Musculoskeletal: Negative for back pain and joint pain.  Neurological: Negative for sensory change, speech change, focal weakness and weakness.  Psychiatric/Behavioral: Negative for depression and hallucinations. The patient is not nervous/anxious.    Tolerating Diet:yes Tolerating PT: ambulatory  DRUG ALLERGIES:   Allergies  Allergen Reactions  . Sulfonamide Derivatives Rash    VITALS:  Blood pressure (!) 152/91, pulse (!) 48, temperature 98.2 F (36.8 C), temperature source Oral, resp. rate 13, height 6' (1.829 m), weight 114.2 kg, SpO2 98 %.  PHYSICAL EXAMINATION:   Physical Exam  GENERAL:  72 y.o.-year-old patient lying in the bed with no acute distress.  EYES: Pupils equal, round, reactive to light and accommodation. No scleral icterus. Extraocular muscles intact.  HEENT: Head atraumatic, normocephalic. Oropharynx and nasopharynx clear.  NECK:  Supple, no jugular venous distention. No thyroid enlargement, no tenderness.  LUNGS: Normal breath sounds bilaterally, no wheezing, rales, rhonchi. No use of accessory muscles of respiration.  CARDIOVASCULAR: S1, S2 normal. No murmurs, rubs, or gallops.  ABDOMEN: Soft, nontender,  nondistended. Bowel sounds present. No organomegaly or mass.  EXTREMITIES: No cyanosis, clubbing or edema b/l.    NEUROLOGIC: Cranial nerves II through XII are intact. No focal Motor or sensory deficits b/l.   PSYCHIATRIC:  patient is alert and oriented x 3.  SKIN: No obvious rash, lesion, or ulcer.   LABORATORY PANEL:  CBC Recent Labs  Lab 05/11/18 0507  WBC 8.8  HGB 15.0  HCT 44.2  PLT 140*    Chemistries  Recent Labs  Lab 05/10/18 0156  NA 139  K 3.5  CL 99  CO2 28  GLUCOSE 97  BUN 17  CREATININE 1.36*  CALCIUM 8.9   Cardiac Enzymes Recent Labs  Lab 05/09/18 0003  TROPONINI <0.03   RADIOLOGY:  No results found. ASSESSMENT AND PLAN:  MERIC JOYE is a 72 y.o. male with below list of chronic medical conditions including recent cardioversion performed on 05/06/2018 by Dr. and secondary to atrial fibrillation presents to the emergency department with progressive dyspnea and chest tightness   *Acute on chronic diastolic congestive heart failure.   -Diuresed well -No crackles today and no lower extremity edema.  Will change to oral Lasix. -Cardiac catheterization showed1.  Significant one-vessel coronary artery disease affecting the right coronary artery.  The coronary arteries are overall moderately calcified. 2.  Right heart catheterization showed normal RA and RV pressures.  I could not advance the catheter into the pulmonary artery in spite of multiple attempts. 3.  Normal left ventricular end-diastolic pressure at 5 mmHg. -patient to get RCA stent placement tomorrow. -IV Heparin drip. -Aspirin daily  *Hypertension:   -Continue ACE inhibitor as well as metoprolol.   * Hypothyroidism:  -Normal TSH. Continue Synthroid.  *Paroxysmal atrial fibrillation.  Now in normal sinus rhythm.  Eliquis stopped and heparin drip started for cardiac catheterization   * BPH: Continue doxazosin  Case discussed with Care Management/Social Worker. Management  plans discussed with the patient, family and they are in agreement.  CODE STATUS: full  DVT Prophylaxis: heparin gtt  TOTAL TIME TAKING CARE OF THIS PATIENT: 30 minutes.  >50% time spent on counselling and coordination of care  POSSIBLE D/C IN *1-2* DAYS, DEPENDING ON CLINICAL CONDITION.  Note: This dictation was prepared with Dragon dictation along with smaller phrase technology. Any transcriptional errors that result from this process are unintentional.  Fritzi Mandes M.D on 05/12/2018 at 3:08 PM  Between 7am to 6pm - Pager - [phone]  After 6pm go to [email or web address] - password [email or web address] [facility]  Office  [phone]  CC: Primary care physician; Tonia Ghent, MDPatient ID: [name], male   DOB: July 09, 1946, 72 y.o.   MRN: [id number]

## 2018-05-12 NOTE — Interval H&P Note (Signed)
History and Physical Interval Note:  05/12/2018 8:26 AM  James [name]  has presented today for surgery, with the diagnosis of acute diastolic chf/mitral regurgitation  The various methods of treatment have been discussed with the patient and family. After consideration of risks, benefits and other options for treatment, the patient has consented to  Procedure(s): RIGHT/LEFT HEART CATH AND CORONARY ANGIOGRAPHY (N/A) as a surgical intervention .  The patient's history has been reviewed, patient examined, no change in status, stable for surgery.  I have reviewed the patient's chart and labs.  Questions were answered to the patient's satisfaction.     Kathlyn Sacramento

## 2018-05-12 NOTE — Progress Notes (Signed)
Assisted up to bathroom, groin site without complications

## 2018-05-12 NOTE — Progress Notes (Signed)
To cath lab via bed.

## 2018-05-12 NOTE — Plan of Care (Signed)
  Problem: Clinical Measurements: Goal: Will remain free from infection Outcome: Progressing Note:  Pt remains afebrile Goal: Cardiovascular complication will be avoided Outcome: Progressing   Problem: Cardiac: Goal: Ability to achieve and maintain adequate cardiopulmonary perfusion will improve Outcome: Progressing Note:  Daily weights and intake and output   Problem: Cardiovascular: Goal: Ability to achieve and maintain adequate cardiovascular perfusion will improve Outcome: Not Progressing Note:  Pt had heart cath today, to return to cath lab tomorrow for PCI

## 2018-05-12 NOTE — Care Management Important Message (Signed)
Copy of signed IM left with patient in room.  

## 2018-05-12 NOTE — Progress Notes (Signed)
Pt returned from cath lab s/p cath rt groin with manual hold, rt wrist site.  Both dressings dry and intact.  Sit up time is 1400.  Pt verbalizes understanding.  IVF infusing well rt fa.  Denies need at this time, CB in reach, SR up x 2.

## 2018-05-13 ENCOUNTER — Ambulatory Visit: Payer: Medicare Other | Admitting: Nurse Practitioner

## 2018-05-13 ENCOUNTER — Encounter
Admission: EM | Disposition: A | Discharge status: Home / Self Care | Payer: Self-pay | Source: Home / Self Care | Attending: Internal Medicine

## 2018-05-13 ENCOUNTER — Encounter: Payer: Self-pay | Admitting: *Deleted

## 2018-05-13 DIAGNOSIS — I4819 Other persistent atrial fibrillation: Secondary | ICD-10-CM

## 2018-05-13 DIAGNOSIS — I200 Unstable angina: Secondary | ICD-10-CM

## 2018-05-13 DIAGNOSIS — I10 Essential (primary) hypertension: Secondary | ICD-10-CM

## 2018-05-13 DIAGNOSIS — I25110 Atherosclerotic heart disease of native coronary artery with unstable angina pectoris: Secondary | ICD-10-CM

## 2018-05-13 DIAGNOSIS — I712 Thoracic aortic aneurysm, without rupture: Secondary | ICD-10-CM

## 2018-05-13 HISTORY — PX: CORONARY STENT INTERVENTION: CATH118234

## 2018-05-13 LAB — BASIC METABOLIC PANEL
Anion gap: 7 (ref 5–15)
BUN: 23 mg/dL (ref 8–23)
CHLORIDE: 107 mmol/L (ref 98–111)
CO2: 25 mmol/L (ref 22–32)
CREATININE: 1.22 mg/dL (ref 0.61–1.24)
Calcium: 8.8 mg/dL — ABNORMAL LOW (ref 8.9–10.3)
GFR calc Af Amer: >60 mL/min (ref >60)
GFR calc non Af Amer: 58 mL/min — ABNORMAL LOW (ref >60)
Glucose, Bld: 101 mg/dL — ABNORMAL HIGH (ref 70–99)
Potassium: 3.4 mmol/L — ABNORMAL LOW (ref 3.5–5.1)
Sodium: 139 mmol/L (ref 135–145)

## 2018-05-13 LAB — CBC
HEMATOCRIT: 43.8 % (ref 40.0–52.0)
Hemoglobin: 15 g/dL (ref 13.0–18.0)
MCH: 30.2 pg (ref 26.0–34.0)
MCHC: 34.2 g/dL (ref 32.0–36.0)
MCV: 88.5 fL (ref 80.0–100.0)
Platelets: 139 10*3/uL — ABNORMAL LOW (ref 150–440)
RBC: 4.95 MIL/uL (ref 4.40–5.90)
RDW: 14.4 % (ref 11.5–14.5)
WBC: 9.4 10*3/uL (ref 3.8–10.6)

## 2018-05-13 LAB — HEPARIN LEVEL (UNFRACTIONATED): Heparin Unfractionated: 0.49 IU/mL (ref 0.30–0.70)

## 2018-05-13 LAB — POCT ACTIVATED CLOTTING TIME: Activated Clotting Time: 340 seconds

## 2018-05-13 LAB — APTT: aPTT: 78 seconds — ABNORMAL HIGH (ref 24–36)

## 2018-05-13 SURGERY — CORONARY STENT INTERVENTION
Anesthesia: Moderate Sedation

## 2018-05-13 MED ORDER — FENTANYL CITRATE (PF) 100 MCG/2ML IJ SOLN
INTRAMUSCULAR | Status: DC | PRN
  Administered 2018-05-13: 50 ug via INTRAVENOUS

## 2018-05-13 MED ORDER — IOPAMIDOL (ISOVUE-300) INJECTION 61%
INTRAVENOUS | Status: DC | PRN
  Administered 2018-05-13: 55 mL via INTRA_ARTERIAL

## 2018-05-13 MED ORDER — MIDAZOLAM HCL 2 MG/2ML IJ SOLN
INTRAMUSCULAR | Status: AC
  Filled 2018-05-13: qty 2

## 2018-05-13 MED ORDER — APIXABAN 5 MG PO TABS
5.0000 mg | ORAL_TABLET | Freq: Two times a day (BID) | ORAL | Status: DC
  Administered 2018-05-14: 5 mg via ORAL
  Filled 2018-05-13 (×2): qty 1

## 2018-05-13 MED ORDER — SODIUM CHLORIDE 0.9 % IV SOLN
INTRAVENOUS | Status: AC | PRN
  Administered 2018-05-13: 250 mL/h via INTRAVENOUS

## 2018-05-13 MED ORDER — SODIUM CHLORIDE 0.9 % IV SOLN: 250.0000 mL | INTRAVENOUS | Status: DC | PRN

## 2018-05-13 MED ORDER — ASPIRIN 81 MG PO CHEW
81.0000 mg | CHEWABLE_TABLET | Freq: Once | ORAL | Status: AC
  Administered 2018-05-13: 81 mg via ORAL

## 2018-05-13 MED ORDER — SODIUM CHLORIDE 0.9 % IV SOLN: INTRAVENOUS | Status: AC

## 2018-05-13 MED ORDER — NITROGLYCERIN 5 MG/ML IV SOLN
INTRAVENOUS | Status: AC
  Filled 2018-05-13: qty 10

## 2018-05-13 MED ORDER — CLOPIDOGREL BISULFATE 75 MG PO TABS
300.0000 mg | ORAL_TABLET | Freq: Once | ORAL | Status: AC
  Administered 2018-05-13: 300 mg via ORAL

## 2018-05-13 MED ORDER — FENTANYL CITRATE (PF) 100 MCG/2ML IJ SOLN
INTRAMUSCULAR | Status: AC
  Filled 2018-05-13: qty 2

## 2018-05-13 MED ORDER — ASPIRIN 81 MG PO CHEW
81.0000 mg | CHEWABLE_TABLET | Freq: Every day | ORAL | Status: DC
  Administered 2018-05-13: 81 mg via ORAL

## 2018-05-13 MED ORDER — ASPIRIN 81 MG PO CHEW
CHEWABLE_TABLET | ORAL | Status: AC
  Administered 2018-05-13: 16:00:00
  Filled 2018-05-13: qty 1

## 2018-05-13 MED ORDER — SODIUM CHLORIDE 0.9% FLUSH: 3.0000 mL | INTRAVENOUS | Status: DC | PRN

## 2018-05-13 MED ORDER — HEPARIN (PORCINE) IN NACL 100-0.45 UNIT/ML-% IJ SOLN
1200.0000 [IU]/h | INTRAMUSCULAR | Status: DC
  Administered 2018-05-13 – 2018-05-14 (×2): 1200 [IU]/h via INTRAVENOUS
  Filled 2018-05-13: qty 250

## 2018-05-13 MED ORDER — METOPROLOL SUCCINATE ER 25 MG PO TB24
12.5000 mg | ORAL_TABLET | Freq: Every day | ORAL | Status: DC
  Administered 2018-05-14: 12.5 mg via ORAL
  Filled 2018-05-13: qty 1

## 2018-05-13 MED ORDER — VERAPAMIL HCL 2.5 MG/ML IV SOLN
INTRAVENOUS | Status: AC
  Filled 2018-05-13: qty 2

## 2018-05-13 MED ORDER — SODIUM CHLORIDE 0.9% FLUSH
3.0000 mL | Freq: Two times a day (BID) | INTRAVENOUS | Status: DC
  Administered 2018-05-14: 3 mL via INTRAVENOUS

## 2018-05-13 MED ORDER — HEPARIN (PORCINE) IN NACL 1000-0.9 UT/500ML-% IV SOLN
INTRAVENOUS | Status: AC
  Filled 2018-05-13: qty 1000

## 2018-05-13 MED ORDER — HEPARIN SODIUM (PORCINE) 1000 UNIT/ML IJ SOLN
INTRAMUSCULAR | Status: DC | PRN
  Administered 2018-05-13: 10000 [IU] via INTRAVENOUS

## 2018-05-13 MED ORDER — MIDAZOLAM HCL 2 MG/2ML IJ SOLN
INTRAMUSCULAR | Status: DC | PRN
  Administered 2018-05-13: 1 mg via INTRAVENOUS

## 2018-05-13 MED ORDER — CLOPIDOGREL BISULFATE 75 MG PO TABS
ORAL_TABLET | ORAL | Status: AC
  Administered 2018-05-13: 16:00:00
  Filled 2018-05-13: qty 4

## 2018-05-13 MED ORDER — LABETALOL HCL 5 MG/ML IV SOLN: 10.0000 mg | INTRAVENOUS | Status: AC | PRN

## 2018-05-13 MED ORDER — HYDRALAZINE HCL 20 MG/ML IJ SOLN: 5.0000 mg | INTRAMUSCULAR | Status: AC | PRN

## 2018-05-13 MED ORDER — NITROGLYCERIN 1 MG/10 ML FOR IR/CATH LAB
INTRA_ARTERIAL | Status: DC | PRN
  Administered 2018-05-13: 200 ug via INTRA_ARTERIAL
  Administered 2018-05-13: 200 ug via INTRACORONARY

## 2018-05-13 SURGICAL SUPPLY — 15 items
BALLN TREK RX 2.5X12 (BALLOONS) ×2
BALLN ~~LOC~~ TREK RX 4.0X12 (BALLOONS) ×2
BALLOON TREK RX 2.5X12 (BALLOONS) IMPLANT
BALLOON ~~LOC~~ TREK RX 4.0X12 (BALLOONS) IMPLANT
CATH INFINITI 5FR ANG PIGTAIL (CATHETERS) ×2 IMPLANT
CATH VISTA GUIDE 6FR JR4 (CATHETERS) ×2 IMPLANT
DEVICE INFLAT 30 PLUS (MISCELLANEOUS) ×2 IMPLANT
DEVICE RAD TR BAND REGULAR (VASCULAR PRODUCTS) ×1 IMPLANT
GLIDESHEATH SLEND SS 6F .021 (SHEATH) ×2 IMPLANT
KIT MANI 3VAL PERCEP (MISCELLANEOUS) ×2 IMPLANT
PACK CARDIAC CATH (CUSTOM PROCEDURE TRAY) ×2 IMPLANT
STENT SIERRA 3.50 X 18 MM (Permanent Stent) ×1 IMPLANT
WIRE ASAHI PROWATER 180CM (WIRE) ×2 IMPLANT
WIRE HITORQ VERSACORE ST 145CM (WIRE) ×2 IMPLANT
WIRE ROSEN-J .035X260CM (WIRE) ×2 IMPLANT

## 2018-05-13 NOTE — Brief Op Note (Signed)
BRIEF CARDIAC CATHETERIZATION NOTE  DATE: 05/13/2018 TIME: 12:23 PM  PATIENT:  James [name]  72 y.o. male  PRE-OPERATIVE DIAGNOSIS:  Unstable angina  POST-OPERATIVE DIAGNOSIS:  Unstable angina  PROCEDURE:  Procedure(s): CORONARY STENT INTERVENTION (N/A)  SURGEON:  Surgeon(s) and Role:    * Tashia Leiterman, Harrell Gave, MD - Primary  FINDINGS: 1. Sequential 30% and 80-90% proximal/mid RCA stenoses. 2. Successful PCI to 80-90% mid RCA stenosis using Xience Sierra 3.5 x 18 mm DES.  RECOMMENDATIONS: 1. Aggressive secondary prevention. 2. Restart heparin infusion 2 hours after TR band removal. 3. If no bleeding or evidence of vascular injury, restart apixaban tomorrow. 4. Continue apixaban and clopidogrel for 12 months.  Nelva Bush, MD [facility] Pager: [phone]

## 2018-05-13 NOTE — H&P (View-Only) (Signed)
Progress Note  Patient Name: James [name] Date of Encounter: 05/13/2018  Primary Cardiologist: Nelva Bush, MD   Subjective   No complaints.  No chest pain, shortness of breath, or palpitations.  Catheterization yesterday showed significant RCA disease; PCI not attempted at that time due to difficult vascular access and bleeding around right femoral artery sheath.  Inpatient Medications    Scheduled Meds: . aspirin      . clopidogrel      . amLODipine  5 mg Oral Daily  . clopidogrel  75 mg Oral Daily  . docusate sodium  100 mg Oral BID  . doxazosin  4 mg Oral QPC breakfast  . finasteride  5 mg Oral Daily  . furosemide  20 mg Oral Daily  . levothyroxine  150 mcg Oral See admin instructions  . metoprolol succinate  25 mg Oral Daily  . nitroGLYCERIN  0.5 inch Topical Q6H  . psyllium  1 packet Oral Daily  . ramipril  10 mg Oral Daily  . simvastatin  40 mg Oral QHS  . sodium chloride flush  3 mL Intravenous Q12H   Continuous Infusions: . sodium chloride 1,000 mL (05/13/18 1031)  . heparin Stopped (05/13/18 0955)   PRN Meds: sodium chloride, acetaminophen **OR** acetaminophen, ondansetron **OR** ondansetron (ZOFRAN) IV, sodium chloride flush   Vital Signs    Vitals:   05/13/18 0036 05/13/18 0428 05/13/18 0749 05/13/18 1005  BP: 124/71 (!) 107/58 (!) 141/80 (!) 144/83  Pulse: (!) 52 (!) 47 (!) 49 (!) 49  Resp:  18  17  Temp:  98 F (36.7 C) 97.7 F (36.5 C) (!) 97.5 F (36.4 C)  TempSrc:   Oral Oral  SpO2:  99% 99% 95%  Weight:  114.9 kg    Height:        Intake/Output Summary (Last 24 hours) at 05/13/2018 1102 Last data filed at 05/13/2018 0907 Gross per 24 hour  Intake 959.52 ml  Output 750 ml  Net 209.52 ml   Filed Weights   05/11/18 0500 05/12/18 0422 05/13/18 0428  Weight: 114.9 kg 114.2 kg 114.9 kg    Telemetry    NSR and sinus bradycardia - Personally Reviewed  ECG   No new tracing.  Physical Exam   GEN: No acute distress.   Neck: No  JVD Cardiac: RRR, no murmurs, rubs, or gallops. Right radial and femoral sites intact without hematomas. Respiratory: Clear to auscultation bilaterally. GI: Soft, nontender, non-distended  MS: No edema; No deformity. Neuro:  Nonfocal  Psych: Normal affect   Labs    Chemistry Recent Labs  Lab 05/09/18 0003 05/10/18 0156 05/13/18 0159  NA 138 139 139  K 4.0 3.5 3.4*  CL 107 99 107  CO2 23 28 25   GLUCOSE 101* 97 101*  BUN 16 17 23   CREATININE 1.22 1.36* 1.22  CALCIUM 9.1 8.9 8.8*  GFRNONAA 58* 51* 58*  GFRAA >60 59* >60  ANIONGAP 8 12 7      Hematology Recent Labs  Lab 05/10/18 0156 05/11/18 0507 05/13/18 0159  WBC 9.1 8.8 9.4  RBC 4.97 4.96 4.95  HGB 14.9 15.0 15.0  HCT 44.0 44.2 43.8  MCV 88.5 89.2 88.5  MCH 30.1 30.2 30.2  MCHC 34.0 33.9 34.2  RDW 14.4 14.3 14.4  PLT 154 140* 139*    Cardiac Enzymes Recent Labs  Lab 05/09/18 0003  TROPONINI <0.03   No results for input(s): TROPIPOC in the last 168 hours.   BNP Recent Labs  Lab 05/09/18 0344  BNP 290.0*     DDimer No results for input(s): DDIMER in the last 168 hours.   Radiology    No results found.  Cardiac Studies   Echo (05/09/18): - Procedure narrative: Transthoracic echocardiography. Image   quality was adequate. The study was technically difficult, as a   result of poor acoustic windows and poor sound wave transmission.   Intravenous contrast (Definity) was administered. - Left ventricle: The cavity size was normal. There was moderate   concentric hypertrophy. Systolic function was normal. The   estimated ejection fraction was in the range of 55% to 60%. Wall   motion was normal; there were no regional wall motion   abnormalities. Features are consistent with a pseudonormal left   ventricular filling pattern, with concomitant abnormal relaxation   and increased filling pressure (grade 2 diastolic dysfunction). - Aortic valve: There was trivial regurgitation. - Aorta: Aortic root  dimension: 38 mm (ED). - Mitral valve: Poorly visualized. There was mild regurgitation. - Left atrium: The atrium was moderately dilated. - Right atrium: The atrium was mildly dilated. - Pulmonary arteries: Systolic pressure could not be accurately   estimated.  R/LHC (05/12/18): 1.  Significant one-vessel coronary artery disease affecting the right coronary artery.  The coronary arteries are overall moderately calcified. 2.  Right heart catheterization showed normal RA and RV pressures.  I could not advance the catheter into the pulmonary artery in spite of multiple attempts. 3.  Normal left ventricular Zarina Pe-diastolic pressure at 5 mmHg.  Diagnostic Diagram        Patient Profile     72 y.o. male HTN, HL, OA, hypothyroidism, and PAF on flecainide and s/p recent dccv on 10/1, presenting to the hospital with left chest pain concerning for unstable angina, new onset heart failure.  Assessment & Plan    Unstable angina No further chest pain.  Cath yesterday showed severe proximal RCA and distal LAD disease.  LAD disease not ammenable to PCI.  Intervention to RCA deferred due to difficult vascular access.  Plan to proceed with PCI to proximal RCA today.  Risks and benefits were discussed with the patient, who has agreed to proceed.  We will attempt a left radial approach, given right radial loop encountered yesterday.  Plan to continue 12 months of clopidogrel and apixaban following intervention.  Acute diastolic heart failure LVEF normal by echo.  Patient now asymptomatic.  LVEDP normal yesterday.  Continue furosemide 20 mg PO daily.  Persistent atrial fibrillation Maintaining normal sinus rhythm.  Flecainide stopped due to CAD diagnosed yesterday.  Avoid flecainide.  Consider loading with amiodarone once flecainide has washed out (3 days).  Restart apixaban tomorrow if no evidence of bleeding/vascular injury.  Cut metoprolol succinate to 12.5 mg daily, given sinus  bradycardia.  Thoracic aortic aneurysm Noted on CTA chest at time of admission.  BP and lipid therapy.  Plan for repeat CTA in 6 months to ensure stability.  Hypertension BP mildly elevated.  Decrease metoprolol succinate to 12.5 mg daily.  Consider escalation of ramipril following PCI if BP remains elevated.     For questions or updates, please contact [place] Please consult [email or web address] for contact info under [facility].     Signed, Nelva Bush, MD  05/13/2018, 11:02 AM

## 2018-05-13 NOTE — Progress Notes (Signed)
[place] for Heparin Drip Indication: atrial fibrillation  Allergies  Allergen Reactions  . Sulfonamide Derivatives Rash    Patient Measurements: Height: 6' (182.9 cm) Weight: 253 lb 4.8 oz (114.9 kg) IBW/kg (Calculated) : 77.6 Heparin Dosing Weight: 103.8 kg  Vital Signs: Temp: 97.5 F (36.4 C) (10/08 1005) Temp Source: Oral (10/08 1005) BP: 123/75 (10/08 1300) Pulse Rate: 48 (10/08 1300)  Labs: Recent Labs    05/11/18 0507  05/11/18 1930 05/12/18 0510 05/13/18 0159  HGB 15.0  --   --   --  15.0  HCT 44.2  --   --   --  43.8  PLT 140*  --   --   --  139*  APTT 127*   < > 92* 116* 78*  HEPARINUNFRC 1.02*  --   --  0.82* 0.49  CREATININE  --   --   --   --  1.22   < > = values in this interval not displayed.    Estimated Creatinine Clearance: 73.7 mL/min (by C-G formula based on SCr of 1.22 mg/dL).   Medical History: Past Medical History:  Diagnosis Date  . Acute lower GI bleeding after colonoscopy and polypectomy, resolved 12/12/2015  . Cardiac murmur    a. 02/2018 Echo: EF 60-65%, no rwma, mild AI, mildly dil Ao root/Asc Ao/Arch, Mild MR, mildly dil LA. Nl RV fxn.  . Cataract    bilateral surgery to remove  . Colon polyp   . Constipation   . Dilated aortic root ([place])    a. 02/2018 Echo: mildly dil Ao root/asc ao/arch.  . Essential hypertension   . Hemorrhoids   . Hepatitis    self resolved, likely food exposure, 1974.   Marland Kitchen History of cardiovascular stress test    a. 02/2018 Myoview: EF 55-65%, small/mild apical defect w/ nl wall motion ->attenuation artifact. No ischemia. Low risk study.  Marland Kitchen Hx of adenomatous colonic polyps 12/05/2015  . Hyperlipidemia   . Hypothyroidism   . Osteoarthritis   . Persistent atrial fibrillation    a. Dx 02/2018. CHA2DS2VASc = 2-->Eliquis; b. 03/2018 DCCV (200J); c. 03/2018 recurrent Afib-->flecainide started 04/2018;  d. 05/06/2018 s/p successful DCCV - 200J x 2.  . Skin cancer    basal cell, R  ear, MOHS    Medications:  Scheduled:  . [MAR Hold] amLODipine  5 mg Oral Daily  . aspirin      . aspirin  81 mg Oral Daily  . clopidogrel      . [MAR Hold] clopidogrel  75 mg Oral Daily  . [MAR Hold] docusate sodium  100 mg Oral BID  . [MAR Hold] doxazosin  4 mg Oral QPC breakfast  . [MAR Hold] finasteride  5 mg Oral Daily  . [MAR Hold] furosemide  20 mg Oral Daily  . [MAR Hold] levothyroxine  150 mcg Oral See admin instructions  . [START ON 05/14/2018] metoprolol succinate  12.5 mg Oral Daily  . [MAR Hold] psyllium  1 packet Oral Daily  . [MAR Hold] ramipril  10 mg Oral Daily  . [MAR Hold] simvastatin  40 mg Oral QHS  . [MAR Hold] sodium chloride flush  3 mL Intravenous Q12H  . sodium chloride flush  3 mL Intravenous Q12H   Infusions:  . [MAR Hold] sodium chloride 1,000 mL (05/13/18 1031)  . sodium chloride 50 mL/hr at 05/13/18 1230  . sodium chloride    . heparin      Assessment: 72  yo M to start Heparin Drip for Atrial Fibrillation. Patient on apixaban PTA. Last dose of apixban was taken ~ 2200 on 05/08/18. Hgb 15.1  Plt 153   INR 1.31  APTT 48 (drawn after drip started) HL 2.26  Goal of Therapy:  Heparin level 0.3-0.7 units/ml aPTT 66-102 seconds Monitor platelets by anticoagulation protocol: Yes   Plan:  10/6 APTT @ 1308 resulted @ 90 which is wnl. Will recheck in 6 hours.  10/6 aPPT @ 1930 resulted in @ 92 which is WNL.  Will recheck with morning labs now.  10/7  Patient to Cath Lab this am. Heparin Drip to be restarted 8 hours after Sheath pulled with no bolus per consult. Sheath noted to be removed @ 0951 per procedure notes. Will restart @ 1800.   Labs drawn this am before pt went to Cath Lab:  aPTT 116, HL 0.82.   Will restart Heparin Drip at a slightly lower rate of 1200 units/hr. Will check aPTT and HL 8 hours after restart of drip. IF aPTT and HL correlate then will continue with HL going forward.  10/8 0200 aPTT 78, HL 0.49. These now correlate so will  continue to follow HL going forward an dcontinue current rate. Will f/u confirmatory level in 8 hours.   10/8 Patient went to Cath lab. Now consult to restart Heparin drip 2 hours post TR band removal. Called [facility] and spoke with Robin RN- Band to be removed by 3:15. Will schedule Heparin drip to resume at 1700 and next HL 8 hours later.  Avaleigh Decuir A, PharmD 05/13/2018,1:18 PM

## 2018-05-13 NOTE — Progress Notes (Signed)
Patient arrived back s/p cath through left radial, site is clean/dry and intact. No c/o pain, VSS, will continue to assess and monitor.

## 2018-05-13 NOTE — Progress Notes (Signed)
Dr. Saunders Revel at bedside, speaking extensively with pt. And his spouse re: today's cath/PCI procedure. Pt. And wife verbalize understanding.

## 2018-05-13 NOTE — Progress Notes (Signed)
[place] for Heparin Drip Indication: atrial fibrillation  Allergies  Allergen Reactions  . Sulfonamide Derivatives Rash    Patient Measurements: Height: 6' (182.9 cm) Weight: 253 lb 4.8 oz (114.9 kg) IBW/kg (Calculated) : 77.6 Heparin Dosing Weight: 103.8 kg  Vital Signs: Temp: 98 F (36.7 C) (10/08 0428) Temp Source: Oral (10/07 1923) BP: 107/58 (10/08 0428) Pulse Rate: 47 (10/08 0428)  Labs: Recent Labs    05/11/18 0507  05/11/18 1930 05/12/18 0510 05/13/18 0159  HGB 15.0  --   --   --  15.0  HCT 44.2  --   --   --  43.8  PLT 140*  --   --   --  139*  APTT 127*   < > 92* 116* 78*  HEPARINUNFRC 1.02*  --   --  0.82* 0.49  CREATININE  --   --   --   --  1.22   < > = values in this interval not displayed.    Estimated Creatinine Clearance: 73.7 mL/min (by C-G formula based on SCr of 1.22 mg/dL).   Medical History: Past Medical History:  Diagnosis Date  . Acute lower GI bleeding after colonoscopy and polypectomy, resolved 12/12/2015  . Cardiac murmur    a. 02/2018 Echo: EF 60-65%, no rwma, mild AI, mildly dil Ao root/Asc Ao/Arch, Mild MR, mildly dil LA. Nl RV fxn.  . Cataract    bilateral surgery to remove  . Colon polyp   . Constipation   . Dilated aortic root ([place])    a. 02/2018 Echo: mildly dil Ao root/asc ao/arch.  . Essential hypertension   . Hemorrhoids   . Hepatitis    self resolved, likely food exposure, 1974.   Marland Kitchen History of cardiovascular stress test    a. 02/2018 Myoview: EF 55-65%, small/mild apical defect w/ nl wall motion ->attenuation artifact. No ischemia. Low risk study.  Marland Kitchen Hx of adenomatous colonic polyps 12/05/2015  . Hyperlipidemia   . Hypothyroidism   . Osteoarthritis   . Persistent atrial fibrillation    a. Dx 02/2018. CHA2DS2VASc = 2-->Eliquis; b. 03/2018 DCCV (200J); c. 03/2018 recurrent Afib-->flecainide started 04/2018;  d. 05/06/2018 s/p successful DCCV - 200J x 2.  . Skin cancer    basal cell, R  ear, MOHS    Medications:  Scheduled:  . amLODipine  5 mg Oral Daily  . clopidogrel  75 mg Oral Daily  . docusate sodium  100 mg Oral BID  . doxazosin  4 mg Oral QPC breakfast  . finasteride  5 mg Oral Daily  . furosemide  20 mg Oral Daily  . levothyroxine  150 mcg Oral See admin instructions  . metoprolol succinate  25 mg Oral Daily  . nitroGLYCERIN  0.5 inch Topical Q6H  . psyllium  1 packet Oral Daily  . ramipril  10 mg Oral Daily  . simvastatin  40 mg Oral QHS  . sodium chloride flush  3 mL Intravenous Q12H   Infusions:  . sodium chloride    . heparin 1,200 Units/hr (05/13/18 0234)    Assessment: 72 yo M to start Heparin Drip for Atrial Fibrillation. Patient on apixaban PTA. Last dose of apixban was taken ~ 2200 on 05/08/18. Hgb 15.1  Plt 153   INR 1.31  APTT 48 (drawn after drip started) HL 2.26  Goal of Therapy:  Heparin level 0.3-0.7 units/ml aPTT 66-102 seconds Monitor platelets by anticoagulation protocol: Yes   Plan:  Give 5000 units  bolus x 1 Start heparin infusion at 1550 units/hr   10/04 @1536  aPTT >160.  Will hold for 2 hours and recheck aPTT prior to start.  When restarting plan to initiate at 1350 units/hour if aPTT has fallen to a level less than 102 seconds.  Continue to monitor H&H and platelets  Will check aPTTs for dose adjustment until Heparin level correlates with aPTT. Check Heparin once per day until correlates with aPTT.  1005 0200 aPTT 87. Continue current regimen. Recheck aPTT, heparin level and CBC with tomorrow (10/6) AM labs.  10/06 AM aPTT 127. Decrease to 1250 units/hr and recheck in 6 hours.  10/6 APTT @ 1308 resulted @ 90 which is wnl. Will recheck in 6 hours.  10/6 aPPT @ 1930 resulted in @ 92 which is WNL.  Will recheck with morning labs now.  10/7  Patient to Cath Lab this am. Heparin Drip to be restarted 8 hours after Sheath pulled with no bolus per consult. Sheath noted to be removed @ 0951 per procedure notes. Will restart @  1800.   Labs drawn this am before pt went to Cath Lab:  aPTT 116, HL 0.82.   Will restart Heparin Drip at a slightly lower rate of 1200 units/hr. Will check aPTT and HL 8 hours after restart of drip. IF aPTT and HL correlate then will continue with HL going forward.  10/8 0200 aPTT 78, HL 0.49. These now correlate so will continue to follow HL going forward an dcontinue current rate. Will f/u confirmatory level in 8 hours.   Amylia Collazos A, PharmD 05/13/2018,7:11 AM

## 2018-05-13 NOTE — Progress Notes (Signed)
TR Band removed. No hematoma, edema, ecchymosis, drainage at Left radial site. 2x2 and sterile Tegaderm to site. Update called to [place], Therapist, sports. Pt. tx to room now via bed, on telemetry. Stable for tx.

## 2018-05-13 NOTE — Plan of Care (Signed)
  Problem: Activity: Goal: Risk for activity intolerance will decrease Outcome: Progressing Note:  Up independently in room, tolerating well   Problem: Nutrition: Goal: Adequate nutrition will be maintained Outcome: Progressing   Problem: Coping: Goal: Level of anxiety will decrease Outcome: Progressing   Problem: Pain Managment: Goal: General experience of comfort will improve Outcome: Progressing Note:  No complaints on pain this shift   Problem: Safety: Goal: Ability to remain free from injury will improve Outcome: Progressing   Problem: Skin Integrity: Goal: Risk for impaired skin integrity will decrease Outcome: Progressing   Problem: Activity: Goal: Ability to return to baseline activity level will improve Outcome: Progressing   Problem: Education: Goal: Knowledge of General Education information will improve Description Including pain rating scale, medication(s)/side effects and non-pharmacologic comfort measures Outcome: Completed/Met   Problem: Cardiovascular: Goal: Vascular access site(s) Level 0-1 will be maintained Note:  Both right wrist and right groin cath sites are at level 0, dressings are clean dry & intact

## 2018-05-13 NOTE — Progress Notes (Signed)
Dr. Posey Pronto in at bedside to see pt. And his spouse.

## 2018-05-13 NOTE — Progress Notes (Signed)
Progress Note  Patient Name: James [name] Date of Encounter: 05/13/2018  Primary Cardiologist: Nelva Bush, MD   Subjective   No complaints.  No chest pain, shortness of breath, or palpitations.  Catheterization yesterday showed significant RCA disease; PCI not attempted at that time due to difficult vascular access and bleeding around right femoral artery sheath.  Inpatient Medications    Scheduled Meds: . aspirin      . clopidogrel      . amLODipine  5 mg Oral Daily  . clopidogrel  75 mg Oral Daily  . docusate sodium  100 mg Oral BID  . doxazosin  4 mg Oral QPC breakfast  . finasteride  5 mg Oral Daily  . furosemide  20 mg Oral Daily  . levothyroxine  150 mcg Oral See admin instructions  . metoprolol succinate  25 mg Oral Daily  . nitroGLYCERIN  0.5 inch Topical Q6H  . psyllium  1 packet Oral Daily  . ramipril  10 mg Oral Daily  . simvastatin  40 mg Oral QHS  . sodium chloride flush  3 mL Intravenous Q12H   Continuous Infusions: . sodium chloride 1,000 mL (05/13/18 1031)  . heparin Stopped (05/13/18 0955)   PRN Meds: sodium chloride, acetaminophen **OR** acetaminophen, ondansetron **OR** ondansetron (ZOFRAN) IV, sodium chloride flush   Vital Signs    Vitals:   05/13/18 0036 05/13/18 0428 05/13/18 0749 05/13/18 1005  BP: 124/71 (!) 107/58 (!) 141/80 (!) 144/83  Pulse: (!) 52 (!) 47 (!) 49 (!) 49  Resp:  18  17  Temp:  98 F (36.7 C) 97.7 F (36.5 C) (!) 97.5 F (36.4 C)  TempSrc:   Oral Oral  SpO2:  99% 99% 95%  Weight:  114.9 kg    Height:        Intake/Output Summary (Last 24 hours) at 05/13/2018 1102 Last data filed at 05/13/2018 0907 Gross per 24 hour  Intake 959.52 ml  Output 750 ml  Net 209.52 ml   Filed Weights   05/11/18 0500 05/12/18 0422 05/13/18 0428  Weight: 114.9 kg 114.2 kg 114.9 kg    Telemetry    NSR and sinus bradycardia - Personally Reviewed  ECG   No new tracing.  Physical Exam   GEN: No acute distress.   Neck: No  JVD Cardiac: RRR, no murmurs, rubs, or gallops. Right radial and femoral sites intact without hematomas. Respiratory: Clear to auscultation bilaterally. GI: Soft, nontender, non-distended  MS: No edema; No deformity. Neuro:  Nonfocal  Psych: Normal affect   Labs    Chemistry Recent Labs  Lab 05/09/18 0003 05/10/18 0156 05/13/18 0159  NA 138 139 139  K 4.0 3.5 3.4*  CL 107 99 107  CO2 23 28 25   GLUCOSE 101* 97 101*  BUN 16 17 23   CREATININE 1.22 1.36* 1.22  CALCIUM 9.1 8.9 8.8*  GFRNONAA 58* 51* 58*  GFRAA >60 59* >60  ANIONGAP 8 12 7      Hematology Recent Labs  Lab 05/10/18 0156 05/11/18 0507 05/13/18 0159  WBC 9.1 8.8 9.4  RBC 4.97 4.96 4.95  HGB 14.9 15.0 15.0  HCT 44.0 44.2 43.8  MCV 88.5 89.2 88.5  MCH 30.1 30.2 30.2  MCHC 34.0 33.9 34.2  RDW 14.4 14.3 14.4  PLT 154 140* 139*    Cardiac Enzymes Recent Labs  Lab 05/09/18 0003  TROPONINI <0.03   No results for input(s): TROPIPOC in the last 168 hours.   BNP Recent Labs  Lab 05/09/18 0344  BNP 290.0*     DDimer No results for input(s): DDIMER in the last 168 hours.   Radiology    No results found.  Cardiac Studies   Echo (05/09/18): - Procedure narrative: Transthoracic echocardiography. Image   quality was adequate. The study was technically difficult, as a   result of poor acoustic windows and poor sound wave transmission.   Intravenous contrast (Definity) was administered. - Left ventricle: The cavity size was normal. There was moderate   concentric hypertrophy. Systolic function was normal. The   estimated ejection fraction was in the range of 55% to 60%. Wall   motion was normal; there were no regional wall motion   abnormalities. Features are consistent with a pseudonormal left   ventricular filling pattern, with concomitant abnormal relaxation   and increased filling pressure (grade 2 diastolic dysfunction). - Aortic valve: There was trivial regurgitation. - Aorta: Aortic root  dimension: 38 mm (ED). - Mitral valve: Poorly visualized. There was mild regurgitation. - Left atrium: The atrium was moderately dilated. - Right atrium: The atrium was mildly dilated. - Pulmonary arteries: Systolic pressure could not be accurately   estimated.  R/LHC (05/12/18): 1.  Significant one-vessel coronary artery disease affecting the right coronary artery.  The coronary arteries are overall moderately calcified. 2.  Right heart catheterization showed normal RA and RV pressures.  I could not advance the catheter into the pulmonary artery in spite of multiple attempts. 3.  Normal left ventricular Mallary Kreger-diastolic pressure at 5 mmHg.  Diagnostic Diagram        Patient Profile     72 y.o. male HTN, HL, OA, hypothyroidism, and PAF on flecainide and s/p recent dccv on 10/1, presenting to the hospital with left chest pain concerning for unstable angina, new onset heart failure.  Assessment & Plan    Unstable angina No further chest pain.  Cath yesterday showed severe proximal RCA and distal LAD disease.  LAD disease not ammenable to PCI.  Intervention to RCA deferred due to difficult vascular access.  Plan to proceed with PCI to proximal RCA today.  Risks and benefits were discussed with the patient, who has agreed to proceed.  We will attempt a left radial approach, given right radial loop encountered yesterday.  Plan to continue 12 months of clopidogrel and apixaban following intervention.  Acute diastolic heart failure LVEF normal by echo.  Patient now asymptomatic.  LVEDP normal yesterday.  Continue furosemide 20 mg PO daily.  Persistent atrial fibrillation Maintaining normal sinus rhythm.  Flecainide stopped due to CAD diagnosed yesterday.  Avoid flecainide.  Consider loading with amiodarone once flecainide has washed out (3 days).  Restart apixaban tomorrow if no evidence of bleeding/vascular injury.  Cut metoprolol succinate to 12.5 mg daily, given sinus  bradycardia.  Thoracic aortic aneurysm Noted on CTA chest at time of admission.  BP and lipid therapy.  Plan for repeat CTA in 6 months to ensure stability.  Hypertension BP mildly elevated.  Decrease metoprolol succinate to 12.5 mg daily.  Consider escalation of ramipril following PCI if BP remains elevated.     For questions or updates, please contact [place] Please consult [email or web address] for contact info under [facility].     Signed, Nelva Bush, MD  05/13/2018, 11:02 AM

## 2018-05-13 NOTE — Interval H&P Note (Signed)
History and Physical Interval Note:  05/13/2018 11:16 AM  James [name]  has presented today for cardiac catheterization, with the diagnosis of unstable angina  The various methods of treatment have been discussed with the patient and family. After consideration of risks, benefits and other options for treatment, the patient has consented to  Procedure(s): CORONARY STENT INTERVENTION (N/A) as a surgical intervention .  The patient's history has been reviewed, patient examined, no change in status, stable for surgery.  I have reviewed the patient's chart and labs.  Questions were answered to the patient's satisfaction.    Cath Lab Visit (complete for each Cath Lab visit)  Clinical Evaluation Leading to the Procedure:   ACS: Yes.    Non-ACS:  N/A  Nyisha Clippard

## 2018-05-14 ENCOUNTER — Ambulatory Visit: Payer: Medicare Other | Admitting: Nurse Practitioner

## 2018-05-14 DIAGNOSIS — I480 Paroxysmal atrial fibrillation: Secondary | ICD-10-CM

## 2018-05-14 DIAGNOSIS — I25118 Atherosclerotic heart disease of native coronary artery with other forms of angina pectoris: Secondary | ICD-10-CM

## 2018-05-14 LAB — BASIC METABOLIC PANEL
Anion gap: 7 (ref 5–15)
BUN: 18 mg/dL (ref 8–23)
CHLORIDE: 109 mmol/L (ref 98–111)
CO2: 24 mmol/L (ref 22–32)
Calcium: 8.7 mg/dL — ABNORMAL LOW (ref 8.9–10.3)
Creatinine, Ser: 1.18 mg/dL (ref 0.61–1.24)
GFR calc Af Amer: >60 mL/min (ref >60)
GFR calc non Af Amer: >60 mL/min (ref >60)
Glucose, Bld: 94 mg/dL (ref 70–99)
POTASSIUM: 3.6 mmol/L (ref 3.5–5.1)
SODIUM: 140 mmol/L (ref 135–145)

## 2018-05-14 LAB — LIPID PANEL
Cholesterol: 147 mg/dL (ref 0–200)
HDL: 38 mg/dL — AB (ref >40)
LDL Cholesterol: 94 mg/dL (ref 0–99)
TRIGLYCERIDES: 75 mg/dL (ref <150)
Total CHOL/HDL Ratio: 3.9 RATIO
VLDL: 15 mg/dL (ref 0–40)

## 2018-05-14 LAB — CBC
HCT: 42.1 % (ref 39.0–52.0)
Hemoglobin: 14.3 g/dL (ref 13.0–17.0)
MCH: 30 pg (ref 26.0–34.0)
MCHC: 34 g/dL (ref 30.0–36.0)
MCV: 88.4 fL (ref 80.0–100.0)
NRBC: 0 % (ref 0.0–0.2)
PLATELETS: 141 10*3/uL — AB (ref 150–400)
RBC: 4.76 MIL/uL (ref 4.22–5.81)
RDW: 13.2 % (ref 11.5–15.5)
WBC: 7.7 10*3/uL (ref 4.0–10.5)

## 2018-05-14 LAB — HEPARIN LEVEL (UNFRACTIONATED): Heparin Unfractionated: 0.55 IU/mL (ref 0.30–0.70)

## 2018-05-14 MED ORDER — CLOPIDOGREL BISULFATE 75 MG PO TABS: 75.0000 mg | ORAL_TABLET | Freq: Every day | ORAL | 0 refills | Status: DC

## 2018-05-14 MED ORDER — METOPROLOL SUCCINATE ER 25 MG PO TB24: 12.5000 mg | ORAL_TABLET | Freq: Every day | ORAL | 0 refills | Status: DC

## 2018-05-14 MED ORDER — FUROSEMIDE 20 MG PO TABS: 20.0000 mg | ORAL_TABLET | Freq: Every day | ORAL | 0 refills | Status: DC

## 2018-05-14 MED ORDER — AMIODARONE HCL 200 MG PO TABS: 200.0000 mg | ORAL_TABLET | Freq: Two times a day (BID) | ORAL | 0 refills | Status: DC

## 2018-05-14 NOTE — Plan of Care (Signed)
  Problem: Activity: Goal: Risk for activity intolerance will decrease Outcome: Progressing Note:  Up independently in room, tolerating well   Problem: Coping: Goal: Level of anxiety will decrease Outcome: Progressing   Problem: Safety: Goal: Ability to remain free from injury will improve Outcome: Progressing   Problem: Skin Integrity: Goal: Risk for impaired skin integrity will decrease Outcome: Progressing   Problem: Pain Managment: Goal: General experience of comfort will improve Outcome: Completed/Met   Problem: Cardiovascular: Goal: Vascular access site(s) Level 0-1 will be maintained Outcome: Completed/Met Note:  Left wrist from 10/8 cath at a level 0, no bleeding/bruising, right wrist & right groin at a level 0 as well from cath on 10/7   Problem: Clinical Measurements: Goal: Respiratory complications will improve Outcome: Not Applicable   Problem: Nutrition: Goal: Adequate nutrition will be maintained Outcome: Not Applicable

## 2018-05-14 NOTE — Progress Notes (Signed)
Progress Note  Patient Name: James [name] Date of Encounter: 05/14/2018  Primary Cardiologist: Nelva Bush, MD   Subjective   No events overnight Feels ready to go home, denies any chest pain    Inpatient Medications    Scheduled Meds: . amLODipine  5 mg Oral Daily  . apixaban  5 mg Oral BID  . clopidogrel  75 mg Oral Daily  . docusate sodium  100 mg Oral BID  . doxazosin  4 mg Oral QPC breakfast  . finasteride  5 mg Oral Daily  . furosemide  20 mg Oral Daily  . levothyroxine  150 mcg Oral See admin instructions  . metoprolol succinate  12.5 mg Oral Daily  . psyllium  1 packet Oral Daily  . ramipril  10 mg Oral Daily  . simvastatin  40 mg Oral QHS  . sodium chloride flush  3 mL Intravenous Q12H  . sodium chloride flush  3 mL Intravenous Q12H   Continuous Infusions: . sodium chloride 10 mL/hr at 05/13/18 1031  . sodium chloride    . heparin 1,200 Units/hr (05/14/18 0801)   PRN Meds: sodium chloride, sodium chloride, acetaminophen **OR** acetaminophen, ondansetron **OR** ondansetron (ZOFRAN) IV, sodium chloride flush, sodium chloride flush   Vital Signs    Vitals:   05/13/18 1549 05/13/18 2001 05/14/18 0435 05/14/18 0908  BP: (!) 149/86 123/73 136/83 105/60  Pulse: (!) 49 (!) 54 (!) 53 66  Resp:  18  19  Temp:  98.1 F (36.7 C) 97.9 F (36.6 C) 97.8 F (36.6 C)  TempSrc:  Oral Oral Oral  SpO2:  96% 98% 98%  Weight:   115.2 kg   Height:        Intake/Output Summary (Last 24 hours) at 05/14/2018 0943 Last data filed at 05/14/2018 0444 Gross per 24 hour  Intake 497.86 ml  Output 250 ml  Net 247.86 ml   Filed Weights   05/12/18 0422 05/13/18 0428 05/14/18 0435  Weight: 114.2 kg 114.9 kg 115.2 kg    Telemetry    Normal sinus rhythm- Personally Reviewed  ECG      Physical Exam   In bed GEN:No acute distress.   Neck:No JVD Cardiac:RRR, no murmurs, rubs, or gallops. Right and left radial and femoral sites intact without  hematomas. Respiratory:Clear to auscultation bilaterally. YQ:IHKV, nontender, non-distended  MS:No edema; No deformity. Neuro:Nonfocal  Psych: Normal affect    Labs    Chemistry Recent Labs  Lab 05/10/18 0156 05/13/18 0159 05/14/18 0127  NA 139 139 140  K 3.5 3.4* 3.6  CL 99 107 109  CO2 28 25 24   GLUCOSE 97 101* 94  BUN 17 23 18   CREATININE 1.36* 1.22 1.18  CALCIUM 8.9 8.8* 8.7*  GFRNONAA 51* 58* >60  GFRAA 59* >60 >60  ANIONGAP 12 7 7      Hematology Recent Labs  Lab 05/11/18 0507 05/13/18 0159 05/14/18 0127  WBC 8.8 9.4 7.7  RBC 4.96 4.95 4.76  HGB 15.0 15.0 14.3  HCT 44.2 43.8 42.1  MCV 89.2 88.5 88.4  MCH 30.2 30.2 30.0  MCHC 33.9 34.2 34.0  RDW 14.3 14.4 13.2  PLT 140* 139* 141*    Cardiac Enzymes Recent Labs  Lab 05/09/18 0003  TROPONINI <0.03   No results for input(s): TROPIPOC in the last 168 hours.   BNP Recent Labs  Lab 05/09/18 0344  BNP 290.0*     DDimer No results for input(s): DDIMER in the last 168 hours.  Radiology    No results found.  Cardiac Studies     Patient Profile     James [name] is a 72 y.o. male with a history of HTN, HL, OA, hypothyroidism, and PAF on flecainide and s/p recent dccv on 10/1, presenting to the hospital with  left chest pain concerning for unstable angina, new onset heart failure .  Assessment & Plan    1.  Acute diastolic congestive heart failure/hypertensive urgency:  S/p cardioversion in the setting of persistent atrial fibrillation on October 1.  Presenting with orthopnea ,  Fatigue,  left-sided chest discomfort.    CTA of the chest was negative for PE but notable for CHF and bilateral pleural effusions.   --- Appears relatively euvolemic, continue Lasix 20 daily  2.  Persistent atrial fibrillation:  Flecainide initiated in September   status post cardioversion on October 1.   maintaining sinus rhythm.  Flecainide held given coronary artery disease Given bradycardia would  start low-dose amiodarone 200 twice daily with metoprolol succinate 12.5 daily Continue Eliquis 5 twice daily   3.  Hyperlipidemia:  On statin therapy.   LDL 73 last December. Continue simvastatin 40 mg daily  4.  Ascending aortic aneurysm:  Maximal dimension 4.7 cm noted on CT  Will need annual imaging  5. CAD, PCI of severe stenosis of right coronary artery Statin, beta-blocker, Plavix No aspirin as he is on Eliquis  6.  Hypertension Continue amlodipine and beta-blocker   Total encounter time more than 25 minutes  Greater than 50% was spent in counseling and coordination of care with the patient   For questions or updates, please contact [place] Please consult [email or web address] for contact info under        Signed, Ida Rogue, MD  05/14/2018, 9:43 AM

## 2018-05-14 NOTE — Discharge Instructions (Signed)
Heart healthy diet  You can start taking New medication Amiodarone from 05/16/2018( Friday) morning

## 2018-05-14 NOTE — Progress Notes (Signed)
[place] for Heparin Drip Indication: atrial fibrillation  Allergies  Allergen Reactions  . Sulfonamide Derivatives Rash    Patient Measurements: Height: 6' (182.9 cm) Weight: 253 lb 4.8 oz (114.9 kg) IBW/kg (Calculated) : 77.6 Heparin Dosing Weight: 103.8 kg  Vital Signs: Temp: 98.1 F (36.7 C) (10/08 2001) Temp Source: Oral (10/08 2001) BP: 123/73 (10/08 2001) Pulse Rate: 54 (10/08 2001)  Labs: Recent Labs    05/11/18 0507  05/11/18 1930 05/12/18 0510 05/13/18 0159 05/14/18 0127  HGB 15.0  --   --   --  15.0 14.3  HCT 44.2  --   --   --  43.8 42.1  PLT 140*  --   --   --  139* 141*  APTT 127*   < > 92* 116* 78*  --   HEPARINUNFRC 1.02*  --   --  0.82* 0.49 0.55  CREATININE  --   --   --   --  1.22 1.18   < > = values in this interval not displayed.    Estimated Creatinine Clearance: 76.2 mL/min (by C-G formula based on SCr of 1.18 mg/dL).   Medical History: Past Medical History:  Diagnosis Date  . Acute lower GI bleeding after colonoscopy and polypectomy, resolved 12/12/2015  . Cardiac murmur    a. 02/2018 Echo: EF 60-65%, no rwma, mild AI, mildly dil Ao root/Asc Ao/Arch, Mild MR, mildly dil LA. Nl RV fxn.  . Cataract    bilateral surgery to remove  . Colon polyp   . Constipation   . Dilated aortic root ([place])    a. 02/2018 Echo: mildly dil Ao root/asc ao/arch.  . Essential hypertension   . Hemorrhoids   . Hepatitis    self resolved, likely food exposure, 1974.   Marland Kitchen History of cardiovascular stress test    a. 02/2018 Myoview: EF 55-65%, small/mild apical defect w/ nl wall motion ->attenuation artifact. No ischemia. Low risk study.  Marland Kitchen Hx of adenomatous colonic polyps 12/05/2015  . Hyperlipidemia   . Hypothyroidism   . Osteoarthritis   . Persistent atrial fibrillation    a. Dx 02/2018. [id number] = 2-->Eliquis; b. 03/2018 DCCV (200J); c. 03/2018 recurrent Afib-->flecainide started 04/2018;  d. 05/06/2018 s/p successful DCCV -  200J x 2.  . Skin cancer    basal cell, R ear, MOHS    Medications:  Scheduled:  . amLODipine  5 mg Oral Daily  . apixaban  5 mg Oral BID  . clopidogrel  75 mg Oral Daily  . docusate sodium  100 mg Oral BID  . doxazosin  4 mg Oral QPC breakfast  . finasteride  5 mg Oral Daily  . furosemide  20 mg Oral Daily  . levothyroxine  150 mcg Oral See admin instructions  . metoprolol succinate  12.5 mg Oral Daily  . psyllium  1 packet Oral Daily  . ramipril  10 mg Oral Daily  . simvastatin  40 mg Oral QHS  . sodium chloride flush  3 mL Intravenous Q12H  . sodium chloride flush  3 mL Intravenous Q12H   Infusions:  . sodium chloride 10 mL/hr at 05/13/18 1031  . sodium chloride    . heparin 1,200 Units/hr (05/13/18 2002)    Assessment: 72 yo M to start Heparin Drip for Atrial Fibrillation. Patient on apixaban PTA. Last dose of apixban was taken ~ 2200 on 05/08/18. Hgb 15.1  Plt 153   INR 1.31  APTT 48 (drawn after  drip started) HL 2.26  Goal of Therapy:  Heparin level 0.3-0.7 units/ml aPTT 66-102 seconds Monitor platelets by anticoagulation protocol: Yes   Plan:  10/09 @ 0130 HL 0.55 therapeutic. Will continue current rate and will recheck HL w/ am labs.  Tobie Lords, PharmD, BCPS Clinical Pharmacist 05/14/2018

## 2018-05-14 NOTE — Care Management Important Message (Signed)
Copy of signed IM left with patient in room.  

## 2018-05-15 NOTE — Discharge Summary (Signed)
[facility] NAME: James [name]    MR#:  [id number]  DATE OF BIRTH:  1946/05/18  DATE OF ADMISSION:  05/09/2018 ADMITTING PHYSICIAN: Harrie Foreman, MD  DATE OF DISCHARGE: 05/14/2018 11:24 AM  PRIMARY CARE PHYSICIAN: Tonia Ghent, MD   ADMISSION DIAGNOSIS:  Acute congestive heart failure, unspecified heart failure type ([place]) [I50.9]  DISCHARGE DIAGNOSIS:  Active Problems:   CHF (congestive heart failure) ([place])   Unstable angina ([place])   SECONDARY DIAGNOSIS:   Past Medical History:  Diagnosis Date  . Acute lower GI bleeding after colonoscopy and polypectomy, resolved 12/12/2015  . Cardiac murmur    a. 02/2018 Echo: EF 60-65%, no rwma, mild AI, mildly dil Ao root/Asc Ao/Arch, Mild MR, mildly dil LA. Nl RV fxn.  . Cataract    bilateral surgery to remove  . Colon polyp   . Constipation   . Dilated aortic root ([place])    a. 02/2018 Echo: mildly dil Ao root/asc ao/arch.  . Essential hypertension   . Hemorrhoids   . Hepatitis    self resolved, likely food exposure, 1974.   Marland Kitchen History of cardiovascular stress test    a. 02/2018 Myoview: EF 55-65%, small/mild apical defect w/ nl wall motion ->attenuation artifact. No ischemia. Low risk study.  Marland Kitchen Hx of adenomatous colonic polyps 12/05/2015  . Hyperlipidemia   . Hypothyroidism   . Osteoarthritis   . Persistent atrial fibrillation    a. Dx 02/2018. CHA2DS2VASc = 2-->Eliquis; b. 03/2018 DCCV (200J); c. 03/2018 recurrent Afib-->flecainide started 04/2018;  d. 05/06/2018 s/p successful DCCV - 200J x 2.  . Skin cancer    basal cell, R ear, MOHS     ADMITTING HISTORY  Chief Complaint: Shortness of breath HPI: The patient with past medical history of atrial fibrillation status post recent cardioversion, hypothyroidism and hypertension presents to the emergency department complaining of shortness of breath.  Patient states that his breathing became more difficult this evening.  He denies any  chest pain but admits to some tightness in the upper part of his chest.  He also denies palpitations.  Chest x-ray in the emergency department showed pulmonary edema and the patient admits to some swelling of his lower extremities.  He was given Lasix IV prior to the emergency department staff calling the hospitalist service for admission.   HOSPITAL COURSE:   James [name] a 72 y.o.malewith below list of chronic medical conditions including recent cardioversion performed on 05/06/2018 by Dr. and secondary to atrial fibrillation presents to the emergency department with progressive dyspnea and chest tightness   *Acute on chronic diastolic congestive heart failure. -Diuresed well Changed to oral Lasix.  *Cardiac catheterization for further work-up.  Patient had drug-eluting stent placed in mid RCA.  Started on aspirin and Brilinta.  At discharge patient was discharged home on aspirin, Brilinta, Lipitor, metoprolol.  *Hypertension:  -Continue ACE inhibitor as well as metoprolol.   * Hypothyroidism:  -Normal TSH. Continue Synthroid.  *Paroxysmal atrial fibrillation.  Patient was on Eliquis prior to discharge.  In the hospital this was changed to heparin in anticipation of cardiac catheterization.  After his procedure patient said heparin was stopped and restarted Eliquis.  Due to bradycardia and also has coronary artery disease flecainide was stopped.  Patient started on amiodarone.  Follow-up with cardiology in 1 week.  * BPH: Continue doxazosin  Patient ambulated in the hallway without any problems.  Discharged home in stable condition.  CONSULTS OBTAINED:  Treatment Team:  Wellington Hampshire, MD  DRUG ALLERGIES:   Allergies  Allergen Reactions  . Sulfonamide Derivatives Rash    DISCHARGE MEDICATIONS:   Allergies as of 05/14/2018      Reactions   Sulfonamide Derivatives Rash      Medication List    STOP taking these medications   flecainide 100 MG  tablet Commonly known as:  TAMBOCOR     TAKE these medications   amiodarone 200 MG tablet Commonly known as:  PACERONE Take 1 tablet (200 mg total) by mouth 2 (two) times daily.   apixaban 5 MG Tabs tablet Commonly known as:  ELIQUIS Take 1 tablet (5 mg total) by mouth 2 (two) times daily.   clopidogrel 75 MG tablet Commonly known as:  PLAVIX Take 1 tablet (75 mg total) by mouth daily.   CO Q-10 VITAMIN E FISH OIL PO Take 1 capsule by mouth daily.   diclofenac sodium 1 % Gel Commonly known as:  VOLTAREN Apply 4 g topically 4 (four) times daily as needed. What changed:  reasons to take this   doxazosin 4 MG tablet Commonly known as:  CARDURA Take 1 tablet (4 mg total) by mouth daily after breakfast.   finasteride 5 MG tablet Commonly known as:  PROSCAR Take 1 tablet (5 mg total) by mouth daily.   furosemide 20 MG tablet Commonly known as:  LASIX Take 1 tablet (20 mg total) by mouth daily.   levothyroxine 150 MCG tablet Commonly known as:  SYNTHROID, LEVOTHROID Take 1 tablet a day except for 1/2 tablet on Sundays.  Total of 6.5 tablets/week. What changed:    how much to take  how to take this  when to take this   metoprolol succinate 25 MG 24 hr tablet Commonly known as:  TOPROL-XL Take 0.5 tablets (12.5 mg total) by mouth daily. Take with or immediately following a meal. What changed:    medication strength  how much to take   multivitamin with minerals Tabs tablet Take 1 tablet by mouth daily. Senior Multivitamin   psyllium 58.6 % powder Commonly known as:  METAMUCIL Take 1 packet by mouth daily.   ramipril 10 MG capsule Commonly known as:  ALTACE Take 1 capsule (10 mg total) by mouth daily. What changed:  when to take this   simvastatin 40 MG tablet Commonly known as:  ZOCOR Take 1 tablet (40 mg total) by mouth at bedtime.       Today   VITAL SIGNS:  Blood pressure 105/60, pulse 66, temperature 97.8 F (36.6 C), temperature source Oral,  resp. rate 19, height 6' (1.829 m), weight 115.2 kg, SpO2 98 %.  I/O:  No intake or output data in the 24 hours ending 05/15/18 1624  PHYSICAL EXAMINATION:  Physical Exam  GENERAL:  72 y.o.-year-old patient lying in the bed with no acute distress.  LUNGS: Normal breath sounds bilaterally, no wheezing, rales,rhonchi or crepitation. No use of accessory muscles of respiration.  CARDIOVASCULAR: S1, S2 normal. No murmurs, rubs, or gallops.  ABDOMEN: Soft, non-tender, non-distended. Bowel sounds present. No organomegaly or mass.  NEUROLOGIC: Moves all 4 extremities. PSYCHIATRIC: The patient is alert and oriented x 3.  SKIN: No obvious rash, lesion, or ulcer.   DATA REVIEW:   CBC Recent Labs  Lab 05/14/18 0127  WBC 7.7  HGB 14.3  HCT 42.1  PLT 141*    Chemistries  Recent Labs  Lab 05/14/18 0127  NA 140  K 3.6  CL 109  CO2 24  GLUCOSE 94  BUN 18  CREATININE 1.18  CALCIUM 8.7*    Cardiac Enzymes Recent Labs  Lab 05/09/18 0003  TROPONINI <0.03    Microbiology Results  Results for orders placed or performed in visit on 05/11/16  CULTURE, URINE COMPREHENSIVE     Status: Abnormal   Collection Time: 05/11/16 11:49 AM  Result Value Ref Range Status   Urine Culture, Comprehensive Final report (A)  Final   Organism ID, Bacteria Comment (A)  Final    Comment: Staphylococcus epidermidis 1,000 Colonies/mL Based on resistance to oxacillin this isolate would be resistant to all currently available beta-lactam antimicrobial agents, with the exception of the newer cephalosporins with anti-MRSA activity, such as Ceftaroline    Organism ID, Bacteria Comment (A)  Final    Comment: Corynebacterium species 8,000  Colonies/mL Susceptibility not normally performed on this organism.    ANTIMICROBIAL SUSCEPTIBILITY Comment  Final    Comment:       ** S = Susceptible; I = Intermediate; R = Resistant **                    P = Positive; N = Negative             MICS are expressed  in micrograms per mL    Antibiotic                 RSLT#1    RSLT#2    RSLT#3    RSLT#4 Ciprofloxacin                  S Gentamicin                     S Levofloxacin                   S Linezolid                      S Nitrofurantoin                 S Oxacillin                      R Penicillin                     R Quinupristin/Dalfopristin      S Rifampin                       S Tetracycline                   R Trimethoprim/Sulfa             S Vancomycin                     S   Microscopic Examination     Status: None   Collection Time: 05/11/16 11:49 AM  Result Value Ref Range Status   WBC, UA 0-5 0 - 5 /hpf Final   RBC, UA 0-2 0 - 2 /hpf Final   Epithelial Cells (non renal) 0-10 0 - 10 /hpf Final   Bacteria, UA None seen None seen/Few Final    RADIOLOGY:  No results found.  Follow up with PCP in 1 week.  Management plans discussed with the patient, family and they are in agreement.  CODE STATUS:  Code Status History    Date Active Date Inactive Code Status  Order ID Comments User Context   05/09/2018 0545 05/14/2018 1432 Full Code [id number]  Harrie Foreman, MD Inpatient   12/06/2015 0136 12/06/2015 1655 Full Code [id number]  Saundra Shelling, MD Inpatient    Advance Directive Documentation     Most Recent Value  Type of Advance Directive  Living will, Healthcare Power of Attorney  Pre-existing out of facility DNR order (yellow form or pink MOST form)  -  "MOST" Form in Place?  -      TOTAL TIME TAKING CARE OF THIS PATIENT ON DAY OF DISCHARGE: more than 30 minutes.   Leia Alf James [name] M.D on 05/15/2018 at 4:24 PM  Between 7am to 6pm - Pager - [phone]  After 6pm go to [email or web address] - password [email or web address] [facility]  Office  [phone]  CC: Primary care physician; Tonia Ghent, MD  Note: This dictation was prepared with Dragon dictation along with smaller phrase technology. Any transcriptional errors that result from this process are  unintentional.

## 2018-05-16 ENCOUNTER — Ambulatory Visit: Payer: Medicare Other | Admitting: Internal Medicine

## 2018-05-16 ENCOUNTER — Encounter: Payer: Self-pay | Admitting: Internal Medicine

## 2018-05-16 VITALS — BP 102/58 | HR 59 | Ht 72.0 in | Wt 261.1 lb

## 2018-05-16 DIAGNOSIS — I5032 Chronic diastolic (congestive) heart failure: Secondary | ICD-10-CM | POA: Diagnosis not present

## 2018-05-16 DIAGNOSIS — I10 Essential (primary) hypertension: Secondary | ICD-10-CM | POA: Diagnosis not present

## 2018-05-16 DIAGNOSIS — E785 Hyperlipidemia, unspecified: Secondary | ICD-10-CM

## 2018-05-16 DIAGNOSIS — Z79899 Other long term (current) drug therapy: Secondary | ICD-10-CM

## 2018-05-16 DIAGNOSIS — I4819 Other persistent atrial fibrillation: Secondary | ICD-10-CM | POA: Diagnosis not present

## 2018-05-16 DIAGNOSIS — I2510 Atherosclerotic heart disease of native coronary artery without angina pectoris: Secondary | ICD-10-CM

## 2018-05-16 MED ORDER — AMIODARONE HCL 200 MG PO TABS: ORAL_TABLET | ORAL | 3 refills | Status: DC

## 2018-05-16 MED ORDER — METOPROLOL SUCCINATE ER 25 MG PO TB24: 12.5000 mg | ORAL_TABLET | Freq: Every day | ORAL | 2 refills | Status: DC

## 2018-05-16 MED ORDER — CLOPIDOGREL BISULFATE 75 MG PO TABS: 75.0000 mg | ORAL_TABLET | Freq: Every day | ORAL | 2 refills | Status: DC

## 2018-05-16 MED ORDER — ATORVASTATIN CALCIUM 80 MG PO TABS: 80.0000 mg | ORAL_TABLET | Freq: Every day | ORAL | 3 refills | Status: DC

## 2018-05-16 MED ORDER — RAMIPRIL 10 MG PO CAPS: 10.0000 mg | ORAL_CAPSULE | Freq: Every day | ORAL | 3 refills | Status: DC

## 2018-05-16 MED ORDER — FUROSEMIDE 20 MG PO TABS: 20.0000 mg | ORAL_TABLET | Freq: Every day | ORAL | 2 refills | Status: DC

## 2018-05-16 NOTE — Patient Instructions (Signed)
Medication Instructions:  Your physician has recommended you make the following change in your medication:  1- Continue with Amiodarone 200 mg by mouth two times a day until the end of October, then November 1st, start amiodarone 200 mg by mouth once a day. 2- STOP Simvastatin. 3- START Atorvastatin 80 mg by mouth once a day with evening meal.   If you need a refill on your cardiac medications before your next appointment, please call your pharmacy.   Lab work: Your physician recommends that you return for lab work in: 2 MONTHS (July 16, 2018. - You will need to be FASTING. DO not eat or drink after midnight the morning of the lab work. - Please go to the [facility]. You will check in at the front desk to the right as you walk into the atrium. Valet Parking is offered if needed.    If you have labs (blood work) drawn today and your tests are completely normal, you will receive your results only by: Marland Kitchen MyChart Message (if you have MyChart) OR . A paper copy in the mail If you have any lab test that is abnormal or we need to change your treatment, we will call you to review the results.  Testing/Procedures: none  Follow-Up: At [facility], you and your health needs are our priority.  As part of our continuing mission to provide you with exceptional heart care, we have created designated Provider Care Teams.  These Care Teams include your primary Cardiologist (physician) and Advanced Practice Providers (APPs -  Physician Assistants and Nurse Practitioners) who all work together to provide you with the care you need, when you need it. You will need a follow up appointment in 3 months.  Please call our office 2 months in advance to schedule this appointment.  You may see Nelva Bush, MD or one of the following Advanced Practice Providers on your designated Care Team:   Murray Hodgkins, NP Christell Faith, PA-C . Marrianne Mood, PA-C  If you have increased swelling or pain in  your right groin, please call our office.

## 2018-05-16 NOTE — Progress Notes (Signed)
Follow-up Outpatient Visit Date: 05/16/2018  Primary Care Provider: Tonia Ghent, MD [place] [address]  Chief Complaint: Follow-up atrial fibrillation, heart failure, and coronary artery disease  HPI:  James [name] is a 72 y.o. year-old male with history of coronary artery disease, HFpEF, persistent atrial fibrillation, hypertension, hyperlipidemia, hypothyroidism, and obesity, who presents for follow-up of CAD and recent diastolic heart failure exacerbation.  He underwent DCCV on 05/06/18 with restoration of sinus rhythm.  He began noticing shortness of breath and chest pain 2-3 days later and ultimately came to the ED.  He was noted to have evidence of acute diastolic heart failure.  He also underwent cardiac catheterization out of concern for unstable angina.  He was noted to have 2-vessel CAD with 80-90% proximal/mid RCA stenosis as well as severe diffuse distal LAD disease.  He underwent staged PCI to the mid RCA 3 days ago.  Due to discovery of significant coronary artery disease, flecainide was discontinued and amiodarone started at the time of discharge.  Today, [name] reports that he is feeling well.  His only complaint is of bruising and a little soreness in the right groin, where he had developed a hematoma after his initial diagnostic catheterization.  He denies chest pain, shortness of breath, palpitations, lightheadedness, orthopnea, and edema.  He is tolerating amiodarone, clopidogrel, and apixaban well.  --------------------------------------------------------------------------------------------------  Past Medical History:  Diagnosis Date  . Acute lower GI bleeding after colonoscopy and polypectomy, resolved 12/12/2015  . Cardiac murmur    a. 02/2018 Echo: EF 60-65%, no rwma, mild AI, mildly dil Ao root/Asc Ao/Arch, Mild MR, mildly dil LA. Nl RV fxn.  . Cataract    bilateral surgery to remove  . Colon polyp   . Constipation   . Dilated aortic  root ([place])    a. 02/2018 Echo: mildly dil Ao root/asc ao/arch.  . Essential hypertension   . Hemorrhoids   . Hepatitis    self resolved, likely food exposure, 1974.   Marland Kitchen History of cardiovascular stress test    a. 02/2018 Myoview: EF 55-65%, small/mild apical defect w/ nl wall motion ->attenuation artifact. No ischemia. Low risk study.  Marland Kitchen Hx of adenomatous colonic polyps 12/05/2015  . Hyperlipidemia   . Hypothyroidism   . Osteoarthritis   . Persistent atrial fibrillation    a. Dx 02/2018. CHA2DS2VASc = 2-->Eliquis; b. 03/2018 DCCV (200J); c. 03/2018 recurrent Afib-->flecainide started 04/2018;  d. 05/06/2018 s/p successful DCCV - 200J x 2.  . Skin cancer    basal cell, R ear, MOHS   Past Surgical History:  Procedure Laterality Date  . BROW LIFT Bilateral 10/23/2016   Procedure: BLEPHAROPLASTY upper eyelid with excess skin;  Surgeon: Karle Starch, MD;  Location: [facility];  Service: Ophthalmology;  Laterality: Bilateral;  MAC  . CARDIOVERSION N/A 03/18/2018   Procedure: CARDIOVERSION;  Surgeon: Nelva Bush, MD;  Location: [facility];  Service: Cardiovascular;  Laterality: N/A;  . CARDIOVERSION N/A 05/06/2018   Procedure: CARDIOVERSION;  Surgeon: Nelva Bush, MD;  Location: [facility];  Service: Cardiovascular;  Laterality: N/A;  . CATARACT EXTRACTION  1986   OD  . CATARACT EXTRACTION Bilateral 1995   x 2 for right and left   . COLONOSCOPY    . CORONARY STENT INTERVENTION N/A 05/13/2018   Procedure: CORONARY STENT INTERVENTION;  Surgeon: Nelva Bush, MD;  Location: [facility];  Service: Cardiovascular;  Laterality: N/A;  . ECTROPION REPAIR Bilateral 10/23/2016   Procedure:  REPAIR OF ECTROPION sutures, extensive;  Surgeon: Karle Starch, MD;  Location: [facility];  Service: Ophthalmology;  Laterality: Bilateral;  . LIPOMA EXCISION  06/1997   left neck ([place])  . MOHS SURGERY Right 2013   behind right ear  . RIGHT/LEFT HEART CATH AND CORONARY ANGIOGRAPHY  N/A 05/12/2018   Procedure: RIGHT/LEFT HEART CATH AND CORONARY ANGIOGRAPHY;  Surgeon: Wellington Hampshire, MD;  Location: [facility];  Service: Cardiovascular;  Laterality: N/A;  . TONSILLECTOMY    . VASECTOMY  1982    Current Meds  Medication Sig  . amiodarone (PACERONE) 200 MG tablet Take 1 tablet (200 mg total) by mouth 2 (two) times daily.  Marland Kitchen apixaban (ELIQUIS) 5 MG TABS tablet Take 1 tablet (5 mg total) by mouth 2 (two) times daily.  . clopidogrel (PLAVIX) 75 MG tablet Take 1 tablet (75 mg total) by mouth daily.  . DHA-EPA-Coenzyme Q10-Vitamin E (CO Q-10 VITAMIN E FISH OIL PO) Take 1 capsule by mouth daily.  . diclofenac sodium (VOLTAREN) 1 % GEL Apply 4 g topically 4 (four) times daily as needed. (Patient taking differently: Apply 4 g topically 4 (four) times daily as needed (knee pain.). )  . doxazosin (CARDURA) 4 MG tablet Take 1 tablet (4 mg total) by mouth daily after breakfast.  . finasteride (PROSCAR) 5 MG tablet Take 1 tablet (5 mg total) by mouth daily.  . furosemide (LASIX) 20 MG tablet Take 1 tablet (20 mg total) by mouth daily.  Marland Kitchen levothyroxine (SYNTHROID, LEVOTHROID) 150 MCG tablet Take 1 tablet a day except for 1/2 tablet on Sundays.  Total of 6.5 tablets/week. (Patient taking differently: Take 150 mcg by mouth See admin instructions. Take 1 tablet a day except for 1/2 tablet on Sundays.  Total of 6.5 tablets/week.)  . metoprolol succinate (TOPROL-XL) 25 MG 24 hr tablet Take 0.5 tablets (12.5 mg total) by mouth daily. Take with or immediately following a meal.  . Multiple Vitamin (MULTIVITAMIN WITH MINERALS) TABS tablet Take 1 tablet by mouth daily. Senior Multivitamin  . psyllium (METAMUCIL) 58.6 % powder Take 1 packet by mouth daily.   . ramipril (ALTACE) 10 MG capsule Take 1 capsule (10 mg total) by mouth daily. (Patient taking differently: Take 10 mg by mouth at bedtime. )  . simvastatin (ZOCOR) 40 MG tablet Take 1 tablet (40 mg total) by mouth at bedtime.     Allergies: Sulfonamide derivatives  Social History   Tobacco Use  . Smoking status: Former Smoker    Packs/day: 2.00    Years: 20.00    Pack years: 40.00    Types: Cigarettes    Last attempt to quit: 08/07/1983    Years since quitting: 34.7  . Smokeless tobacco: Never Used  . Tobacco comment: quit over 30 years ago  Substance Use Topics  . Alcohol use: Yes    Alcohol/week: 7.0 standard drinks    Types: 7 Shots of liquor per week    Comment: a drink every evening, scotch or whiskey  . Drug use: No    Family History  Problem Relation Age of Onset  . Heart disease Paternal Grandfather        MI, old age  . Stroke Mother   . Lung cancer Maternal Grandfather        smoker  . Stroke Maternal Grandfather   . Colon cancer Neg Hx   . Colon polyps Neg Hx   . Esophageal cancer Neg Hx   . Rectal cancer Neg  Hx   . Stomach cancer Neg Hx   . Bladder Cancer Neg Hx   . Prostate cancer Neg Hx     Review of Systems: A 12-system review of systems was performed and was negative except as noted in the HPI.  --------------------------------------------------------------------------------------------------  Physical Exam: BP (!) 102/58   Pulse (!) 59   Ht 6' (1.829 m)   Wt 261 lb 1.9 oz (118.4 kg)   SpO2 99%   BMI 35.41 kg/m   General: NAD. HEENT: No conjunctival pallor or scleral icterus. Moist mucous membranes.  OP clear. Neck: Supple without lymphadenopathy, thyromegaly, JVD, or HJR. Lungs: Normal work of breathing. Clear to auscultation bilaterally without wheezes or crackles. Heart: Bradycardic but regular without murmurs, rubs, or gallops.  Unable to assess PMI due to body habitus. Abd: Bowel sounds present. Soft, NT/ND without hepatosplenomegaly Ext: No lower extremity edema.  2+ radial pulses bilaterally with mild forearm bruising.  Right femoral arteriotomy site is well-healed.  A small hematoma with mild tenderness is appreciated.  There is no bruit.  Bruising extends  along the proximal aspect of the right medial thigh.  Pedal pulses are 2+ on the right. Skin: Warm and dry without rash.  EKG: Sinus bradycardia (heart rate 59 bpm) with first-degree AV block, left anterior fascicular block, and inferior Q waves.  Lab Results  Component Value Date   WBC 7.7 05/14/2018   HGB 14.3 05/14/2018   HCT 42.1 05/14/2018   MCV 88.4 05/14/2018   PLT 141 (L) 05/14/2018    Lab Results  Component Value Date   NA 140 05/14/2018   K 3.6 05/14/2018   CL 109 05/14/2018   CO2 24 05/14/2018   BUN 18 05/14/2018   CREATININE 1.18 05/14/2018   GLUCOSE 94 05/14/2018   ALT 8 02/11/2018    Lab Results  Component Value Date   CHOL 147 05/14/2018   HDL 38 (L) 05/14/2018   LDLCALC 94 05/14/2018   TRIG 75 05/14/2018   CHOLHDL 3.9 05/14/2018    --------------------------------------------------------------------------------------------------  ASSESSMENT AND PLAN: Coronary artery disease without angina No further chest pain since leaving hospital status post PCI to the mid RCA earlier in the week.  Bruising noted in the right groin, though exam is not suggestive of a pseudoaneurysm.  I suspect he has a small, resolving hematoma.  Pedal pulses on the right are excellent.  I have asked Mr. Kroh to monitor the site closely and to let us know if he has increasing pain or swelling.  I advised him that it may take several weeks for the bruising to completely resolve.  We will plan to continue up to 12 months of clopidogrel and apixaban, after which time clopidogrel can be discontinued.  Persistent atrial fibrillation Patient remains in sinus rhythm today.  He is completing a slow load of amiodarone (200 mg twice daily).  We will plan to continue this through the Reggie Bise of October and switch to amiodarone 200 mg daily in November.  Long-term, I will have Mr. Derksen return to see EP to discuss continuation of amiodarone versus an alternative antiarrhythmic strategy, though we will  defer this referral until our follow-up in a few months.  We will continue with indefinite anticoagulation with apixaban.  HFpEF Mr. Langland appears euvolemic and well compensated on exam.  Continue furosemide 20 mg daily.  Importance of sodium restriction was reinforced.  Hypertension Blood pressure is low normal today albeit asymptomatic.  We will not make any medication changes at this  time.  Hyperlipidemia In the setting of known CAD, goal LDL is less than 70.  LDL was 94 during recent hospitalization on simvastatin 40 mg daily.  We have agreed to switch to atorvastatin 80 mg daily, with lipid panel and CMP in about 2 months.  Follow-up: Return to clinic in 3 months.  Nelva Bush, MD 05/16/2018 8:06 AM

## 2018-05-21 ENCOUNTER — Ambulatory Visit: Payer: Medicare Other | Admitting: Internal Medicine

## 2018-05-22 ENCOUNTER — Ambulatory Visit: Payer: Medicare Other | Attending: Family | Admitting: Family

## 2018-05-22 ENCOUNTER — Encounter: Payer: Self-pay | Admitting: Family

## 2018-05-22 VITALS — BP 104/74 | HR 60 | Resp 18 | Ht 72.0 in | Wt 260.5 lb

## 2018-05-22 DIAGNOSIS — Z9889 Other specified postprocedural states: Secondary | ICD-10-CM | POA: Diagnosis not present

## 2018-05-22 DIAGNOSIS — Z79890 Hormone replacement therapy (postmenopausal): Secondary | ICD-10-CM | POA: Insufficient documentation

## 2018-05-22 DIAGNOSIS — E039 Hypothyroidism, unspecified: Secondary | ICD-10-CM | POA: Insufficient documentation

## 2018-05-22 DIAGNOSIS — I5032 Chronic diastolic (congestive) heart failure: Secondary | ICD-10-CM | POA: Insufficient documentation

## 2018-05-22 DIAGNOSIS — Z87891 Personal history of nicotine dependence: Secondary | ICD-10-CM | POA: Diagnosis not present

## 2018-05-22 DIAGNOSIS — I110 Hypertensive heart disease with heart failure: Secondary | ICD-10-CM | POA: Insufficient documentation

## 2018-05-22 DIAGNOSIS — E785 Hyperlipidemia, unspecified: Secondary | ICD-10-CM | POA: Insufficient documentation

## 2018-05-22 DIAGNOSIS — G4733 Obstructive sleep apnea (adult) (pediatric): Secondary | ICD-10-CM | POA: Insufficient documentation

## 2018-05-22 DIAGNOSIS — I10 Essential (primary) hypertension: Secondary | ICD-10-CM

## 2018-05-22 DIAGNOSIS — Z882 Allergy status to sulfonamides status: Secondary | ICD-10-CM | POA: Diagnosis not present

## 2018-05-22 DIAGNOSIS — Z23 Encounter for immunization: Secondary | ICD-10-CM | POA: Diagnosis not present

## 2018-05-22 DIAGNOSIS — Z8249 Family history of ischemic heart disease and other diseases of the circulatory system: Secondary | ICD-10-CM | POA: Insufficient documentation

## 2018-05-22 DIAGNOSIS — Z7901 Long term (current) use of anticoagulants: Secondary | ICD-10-CM | POA: Insufficient documentation

## 2018-05-22 DIAGNOSIS — I4891 Unspecified atrial fibrillation: Secondary | ICD-10-CM | POA: Insufficient documentation

## 2018-05-22 DIAGNOSIS — I4819 Other persistent atrial fibrillation: Secondary | ICD-10-CM

## 2018-05-22 DIAGNOSIS — Z955 Presence of coronary angioplasty implant and graft: Secondary | ICD-10-CM | POA: Diagnosis not present

## 2018-05-22 DIAGNOSIS — Z79899 Other long term (current) drug therapy: Secondary | ICD-10-CM | POA: Insufficient documentation

## 2018-05-22 DIAGNOSIS — Z85828 Personal history of other malignant neoplasm of skin: Secondary | ICD-10-CM | POA: Insufficient documentation

## 2018-05-22 NOTE — Progress Notes (Signed)
Patient ID: James [name], male    DOB: [birth year], 72 y.o.   MRN: [id number]  HPI  James [name] is a 72 y/o male with a history of hyperlipidemia, HTN, thyroid disease, atrial fibrillation, obstructive sleep apnea, previous tobacco use and chronic heart failure.   Echo report from 05/09/18 reviewed and showed an EF of 55-60% along with mild James.   Right/ left cardiac catheterization done 05/12/18 which showed 85% stenosis in RCA with all coronary arteries being moderately calcified. Normal RA/RV pressures with normal left ventricular end-diastolic pressure of 5 mm Hg. Staged PCI tomorrow due to right groin hematoma. PCI with DES to the mid RCA successfully placed on 05/13/18.  Admitted 05/09/18 due to acute on chronic HF. Initially needed IV lasix and then transitioned to oral diuretics. Cardiology consult obtained. Catheterization and stent placement done. Discharged after 5 days.  He presents today for his initial visit with a chief complaint of minimal fatigue upon moderate exertion. He describes this as having been present for several months. He has associated shortness of breath, occasional light-headedness and difficulty sleeping along with this. He denies any abdominal distention, palpitations, pedal edema, chest pain or weight gain.  Past Medical History:  Diagnosis Date  . Acute lower GI bleeding after colonoscopy and polypectomy, resolved 12/12/2015  . Cardiac murmur    a. 02/2018 Echo: EF 60-65%, no rwma, mild AI, mildly dil Ao root/Asc Ao/Arch, Mild James, mildly dil LA. Nl RV fxn.  . Cataract    bilateral surgery to remove  . CHF (congestive heart failure) ([place])   . Colon polyp   . Constipation   . Dilated aortic root ([place])    a. 02/2018 Echo: mildly dil Ao root/asc ao/arch.  . Essential hypertension   . Hemorrhoids   . Hepatitis    self resolved, likely food exposure, 1974.   Marland Kitchen History of cardiovascular stress test    a. 02/2018 Myoview: EF 55-65%, small/mild apical defect w/ nl wall  motion ->attenuation artifact. No ischemia. Low risk study.  Marland Kitchen Hx of adenomatous colonic polyps 12/05/2015  . Hyperlipidemia   . Hypothyroidism   . Obstructive sleep apnea 05/23/2018  . Osteoarthritis   . Persistent atrial fibrillation    a. Dx 02/2018. CHA2DS2VASc = 2-->Eliquis; b. 03/2018 DCCV (200J); c. 03/2018 recurrent Afib-->flecainide started 04/2018;  d. 05/06/2018 s/p successful DCCV - 200J x 2.  . Skin cancer    basal cell, R ear, MOHS   Past Surgical History:  Procedure Laterality Date  . BROW LIFT Bilateral 10/23/2016   Procedure: BLEPHAROPLASTY upper eyelid with excess skin;  Surgeon: Karle Starch, MD;  Location: [place];  Service: Ophthalmology;  Laterality: Bilateral;  MAC  . CARDIOVERSION N/A 03/18/2018   Procedure: CARDIOVERSION;  Surgeon: Nelva Bush, MD;  Location: [facility];  Service: Cardiovascular;  Laterality: N/A;  . CARDIOVERSION N/A 05/06/2018   Procedure: CARDIOVERSION;  Surgeon: Nelva Bush, MD;  Location: [facility];  Service: Cardiovascular;  Laterality: N/A;  . CATARACT EXTRACTION  1986   OD  . CATARACT EXTRACTION Bilateral 1995   x 2 for right and left   . COLONOSCOPY    . CORONARY STENT INTERVENTION N/A 05/13/2018   Procedure: CORONARY STENT INTERVENTION;  Surgeon: Nelva Bush, MD;  Location: [place] [facility];  Service: Cardiovascular;  Laterality: N/A;  . ECTROPION REPAIR Bilateral 10/23/2016   Procedure: REPAIR OF ECTROPION sutures, extensive;  Surgeon: Karle Starch, MD;  Location: [place];  Service: Ophthalmology;  Laterality:  Bilateral;  . LIPOMA EXCISION  06/1997   left neck (Juengel)  . MOHS SURGERY Right 2013   behind right ear  . RIGHT/LEFT HEART CATH AND CORONARY ANGIOGRAPHY N/A 05/12/2018   Procedure: RIGHT/LEFT HEART CATH AND CORONARY ANGIOGRAPHY;  Surgeon: Wellington Hampshire, MD;  Location: [place] [facility];  Service: Cardiovascular;  Laterality: N/A;  . TONSILLECTOMY    . VASECTOMY  1982   Family History   Problem Relation Age of Onset  . Heart disease Paternal Grandfather        MI, old age  . Stroke Mother   . Lung cancer Maternal Grandfather        smoker  . Stroke Maternal Grandfather   . Colon cancer Neg Hx   . Colon polyps Neg Hx   . Esophageal cancer Neg Hx   . Rectal cancer Neg Hx   . Stomach cancer Neg Hx   . Bladder Cancer Neg Hx   . Prostate cancer Neg Hx    Social History   Tobacco Use  . Smoking status: Former Smoker    Packs/day: 2.00    Years: 20.00    Pack years: 40.00    Types: Cigarettes    Last attempt to quit: 08/07/1983    Years since quitting: 34.8  . Smokeless tobacco: Never Used  . Tobacco comment: quit over 30 years ago  Substance Use Topics  . Alcohol use: Yes    Alcohol/week: 7.0 standard drinks    Types: 7 Shots of liquor per week    Comment: a drink every evening, scotch or whiskey   Allergies  Allergen Reactions  . Sulfonamide Derivatives Rash   Prior to Admission medications   Medication Sig Start Date End Date Taking? Authorizing Provider  amiodarone (PACERONE) 200 MG tablet Take 1 tablet (200 mg) by mouth two times a day until the end of October, then decrease to 1 tablet (200 mg) by mouth once a day. 05/16/18  Yes End, Harrell Gave, MD  apixaban (ELIQUIS) 5 MG TABS tablet Take 1 tablet (5 mg total) by mouth 2 (two) times daily. 02/11/18  Yes Tonia Ghent, MD  atorvastatin (LIPITOR) 80 MG tablet Take 1 tablet (80 mg total) by mouth daily. 05/16/18 08/14/18 Yes End, Harrell Gave, MD  clopidogrel (PLAVIX) 75 MG tablet Take 1 tablet (75 mg total) by mouth daily. 05/16/18  Yes End, Harrell Gave, MD  DHA-EPA-Coenzyme Q10-Vitamin E (CO Q-10 VITAMIN E FISH OIL PO) Take 1 capsule by mouth daily.   Yes [provider]  diclofenac sodium (VOLTAREN) 1 % GEL Apply 4 g topically 4 (four) times daily as needed. Patient taking differently: Apply 4 g topically 4 (four) times daily as needed (knee pain.).  08/05/17  Yes Tonia Ghent, MD  doxazosin  (CARDURA) 4 MG tablet Take 1 tablet (4 mg total) by mouth daily after breakfast. 05/07/18  Yes Billey Co, MD  finasteride (PROSCAR) 5 MG tablet Take 1 tablet (5 mg total) by mouth daily. 05/07/18  Yes Billey Co, MD  furosemide (LASIX) 20 MG tablet Take 1 tablet (20 mg total) by mouth daily. 05/16/18  Yes End, Harrell Gave, MD  levothyroxine (SYNTHROID, LEVOTHROID) 150 MCG tablet Take 1 tablet a day except for 1/2 tablet on Sundays.  Total of 6.5 tablets/week. Patient taking differently: Take 150 mcg by mouth See admin instructions. Take 1 tablet a day except for 1/2 tablet on Sundays.  Total of 6.5 tablets/week. 02/12/18  Yes Tonia Ghent, MD  metoprolol succinate (  TOPROL-XL) 25 MG 24 hr tablet Take 0.5 tablets (12.5 mg total) by mouth daily. Take with or immediately following a meal. Patient taking differently: Take 25 mg by mouth daily. Take with or immediately following a meal. 05/16/18 06/15/18 Yes End, Harrell Gave, MD  Multiple Vitamin (MULTIVITAMIN WITH MINERALS) TABS tablet Take 1 tablet by mouth daily. Senior Multivitamin   Yes [provider]  psyllium (METAMUCIL) 58.6 % powder Take 1 packet by mouth daily.    Yes [provider]  ramipril (ALTACE) 10 MG capsule Take 1 capsule (10 mg total) by mouth daily. 05/16/18  Yes End, Harrell Gave, MD    Review of Systems  Constitutional: Positive for fatigue (minimal). Negative for appetite change.  Eyes: Negative.   Respiratory: Positive for shortness of breath. Negative for chest tightness.   Cardiovascular: Negative for chest pain, palpitations and leg swelling.  Gastrointestinal: Negative for abdominal distention and abdominal pain.  Endocrine: Negative.   Genitourinary: Negative.   Musculoskeletal: Positive for arthralgias (left knee at times). Negative for back pain.  Skin: Negative.   Allergic/Immunologic: Negative.   Neurological: Positive for light-headedness (yesterday). Negative for dizziness.   Hematological: Negative for adenopathy. Bruises/bleeds easily.  Psychiatric/Behavioral: Positive for sleep disturbance.    Vitals:   05/22/18 1146  BP: 104/74  Pulse: 60  Resp: 18  SpO2: 100%  Weight: 260 lb 8 oz (118.2 kg)  Height: 6' (1.829 m)   Wt Readings from Last 3 Encounters:  05/22/18 260 lb 8 oz (118.2 kg)  05/16/18 261 lb 1.9 oz (118.4 kg)  05/14/18 254 lb (115.2 kg)   Lab Results  Component Value Date   CREATININE 1.18 05/14/2018   CREATININE 1.22 05/13/2018   CREATININE 1.36 (H) 05/10/2018    Physical Exam  Constitutional: He is oriented to person, place, and time. He appears well-developed and well-nourished.  HENT:  Head: Normocephalic and atraumatic.  Neck: Normal range of motion. Neck supple. No JVD present.  Cardiovascular: Normal rate and regular rhythm.  Pulmonary/Chest: Effort normal. No respiratory distress. He has no wheezes. He has no rales.  Abdominal: Soft. He exhibits no distension.  Musculoskeletal: He exhibits no edema or tenderness.  Neurological: He is alert and oriented to person, place, and time.  Skin: Skin is warm and dry.  Psychiatric: He has a normal mood and affect. His behavior is normal. Thought content normal.  Nursing note and vitals reviewed.   Assessment & Plan:  1: Chronic heart failure with preserved ejection fraction- - NYHA class II - euvolemic today - weighing daily and he was reminded to call for an overnight weight gain of >2 pounds or a weekly weight gain of >5 pounds - not adding salt but says that he doesn't really know how much sodium he's eating. Reviewed the importance of reading food labels so that he can keep his daily sodium intake to <2027m daily. Written dietary information was given to him about this. - saw cardiology (End) 05/16/18 - BNP 05/09/18 was 290.0 - has received his flu vaccine for this season  2: Atrial fibrillation- - had cardioversion 05/06/18  3: HTN- - follows with PCP (Damita Dunnings - BMP  05/14/18 reviewed and showed sodium 140, potassium 3.6, creatinine 1.18 and GFR >60  4: Obstructive sleep apnea- - saw pulmonologist (Ashby Dawes 04/14/18  Medication bottles were reviewed.  Return in 6 weeks or sooner for any questions/problems before then.

## 2018-05-22 NOTE — Patient Instructions (Signed)
Continue weighing daily and call for an overnight weight gain of > 2 pounds or a weekly weight gain of >5 pounds. 

## 2018-05-23 ENCOUNTER — Encounter: Payer: Self-pay | Admitting: Family

## 2018-05-23 DIAGNOSIS — G4733 Obstructive sleep apnea (adult) (pediatric): Secondary | ICD-10-CM | POA: Insufficient documentation

## 2018-06-03 ENCOUNTER — Telehealth: Payer: Self-pay | Admitting: Internal Medicine

## 2018-06-03 NOTE — Telephone Encounter (Signed)
Pt c/o medication issue:  1. Name of Medication: metoprolol   2. How are you currently taking this medication (dosage and times per day)? 12.5 mg vs 25 mg   3. Are you having a reaction (difficulty breathing--STAT)? No   4. What is your medication issue? Patient mychart says take 1/2 pill  Old rx bottle had 50 mg tabs and new has 25 mg tabs   Please call to confirm what patient should be taking

## 2018-06-03 NOTE — Telephone Encounter (Signed)
Spoke with patient. His reason for calling was to see if he could use his old metoprolol 50 mg pills to equal what he should be taking now. He is currently taking the correct dose of metoprolol succinate 12.5 mg (0.5 tablet) by mouth once a day. He verbalized understanding it is best to pick up his refill and and continue taking the way it is prescribed.

## 2018-06-07 ENCOUNTER — Other Ambulatory Visit: Payer: Self-pay | Admitting: Family Medicine

## 2018-06-07 DIAGNOSIS — I4891 Unspecified atrial fibrillation: Secondary | ICD-10-CM

## 2018-06-10 ENCOUNTER — Other Ambulatory Visit: Payer: Self-pay | Admitting: *Deleted

## 2018-06-10 DIAGNOSIS — I4891 Unspecified atrial fibrillation: Secondary | ICD-10-CM

## 2018-06-10 NOTE — Telephone Encounter (Signed)
This request came in yesterday and I sent it to [facility] (thinking that was the process) but apparently she has not taken care of it because I received a second request today.   Faxed refill request. Eliquis Last office visit:   02/11/18 Last Filled:    60 tablet 3 02/11/2018  Please advise.

## 2018-06-11 MED ORDER — APIXABAN 5 MG PO TABS: 5.0000 mg | ORAL_TABLET | Freq: Two times a day (BID) | ORAL | 12 refills | Status: DC

## 2018-06-11 NOTE — Telephone Encounter (Signed)
Sent. Thanks.   

## 2018-06-12 NOTE — Telephone Encounter (Signed)
This is a duplicate request.  R/x sent in on 06/11/18.  Thanks.

## 2018-06-12 NOTE — Telephone Encounter (Signed)
Thank you.  I did not see this in my in basket when checking.  Thank you both for taking care of it.

## 2018-07-02 NOTE — Progress Notes (Signed)
Patient ID: James [name], male    DOB: Dec 14, 1945, 72 y.o.   MRN: [id number]  HPI  James [name] is a 72 y/o male with a history of hyperlipidemia, HTN, thyroid disease, atrial fibrillation, obstructive sleep apnea, previous tobacco use and chronic heart failure.   Echo report from 05/09/18 reviewed and showed an EF of 55-60% along with mild James.   Right/ left cardiac catheterization done 05/12/18 which showed 85% stenosis in RCA with all coronary arteries being moderately calcified. Normal RA/RV pressures with normal left ventricular end-diastolic pressure of 5 mm Hg. Staged PCI tomorrow due to right groin hematoma. PCI with DES to the mid RCA successfully placed on 05/13/18.  Admitted 05/09/18 due to acute on chronic HF. Initially needed IV lasix and then transitioned to oral diuretics. Cardiology consult obtained. Catheterization and stent placement done. Discharged after 5 days.  He presents today for a follow-up visit with a chief complaint of minimal fatigue upon moderate exertion. He says that this has been present for several months but is much better than the last time he was here. He has associated easy bruising and gradual weight gain along with this. He denies any difficulty sleeping, abdominal distention, palpitations, pedal edema,chest pain, shortness of breath or dizziness.   Past Medical History:  Diagnosis Date  . Acute lower GI bleeding after colonoscopy and polypectomy, resolved 12/12/2015  . Cardiac murmur    a. 02/2018 Echo: EF 60-65%, no rwma, mild AI, mildly dil Ao root/Asc Ao/Arch, Mild James, mildly dil LA. Nl RV fxn.  . Cataract    bilateral surgery to remove  . CHF (congestive heart failure) ([place])   . Colon polyp   . Constipation   . Dilated aortic root ([place])    a. 02/2018 Echo: mildly dil Ao root/asc ao/arch.  . Essential hypertension   . Hemorrhoids   . Hepatitis    self resolved, likely food exposure, 1974.   Marland Kitchen History of cardiovascular stress test    a. 02/2018  Myoview: EF 55-65%, small/mild apical defect w/ nl wall motion ->attenuation artifact. No ischemia. Low risk study.  Marland Kitchen Hx of adenomatous colonic polyps 12/05/2015  . Hyperlipidemia   . Hypothyroidism   . Obstructive sleep apnea 05/23/2018  . Osteoarthritis   . Persistent atrial fibrillation    a. Dx 02/2018. CHA2DS2VASc = 2-->Eliquis; b. 03/2018 DCCV (200J); c. 03/2018 recurrent Afib-->flecainide started 04/2018;  d. 05/06/2018 s/p successful DCCV - 200J x 2.  . Skin cancer    basal cell, R ear, MOHS   Past Surgical History:  Procedure Laterality Date  . BROW LIFT Bilateral 10/23/2016   Procedure: BLEPHAROPLASTY upper eyelid with excess skin;  Surgeon: Karle Starch, MD;  Location: [place];  Service: Ophthalmology;  Laterality: Bilateral;  MAC  . CARDIOVERSION N/A 03/18/2018   Procedure: CARDIOVERSION;  Surgeon: Nelva Bush, MD;  Location: [place] [facility];  Service: Cardiovascular;  Laterality: N/A;  . CARDIOVERSION N/A 05/06/2018   Procedure: CARDIOVERSION;  Surgeon: Nelva Bush, MD;  Location: [facility];  Service: Cardiovascular;  Laterality: N/A;  . CATARACT EXTRACTION  1986   OD  . CATARACT EXTRACTION Bilateral 1995   x 2 for right and left   . COLONOSCOPY    . CORONARY STENT INTERVENTION N/A 05/13/2018   Procedure: CORONARY STENT INTERVENTION;  Surgeon: Nelva Bush, MD;  Location: [place] [facility];  Service: Cardiovascular;  Laterality: N/A;  . ECTROPION REPAIR Bilateral 10/23/2016   Procedure: REPAIR OF ECTROPION sutures, extensive;  Surgeon: Warren Lacy  Dennie Maizes, MD;  Location: [place];  Service: Ophthalmology;  Laterality: Bilateral;  . LIPOMA EXCISION  06/1997   left neck (Juengel)  . MOHS SURGERY Right 2013   behind right ear  . RIGHT/LEFT HEART CATH AND CORONARY ANGIOGRAPHY N/A 05/12/2018   Procedure: RIGHT/LEFT HEART CATH AND CORONARY ANGIOGRAPHY;  Surgeon: Wellington Hampshire, MD;  Location: [place] [facility];  Service: Cardiovascular;  Laterality: N/A;  .  TONSILLECTOMY    . VASECTOMY  1982   Family History  Problem Relation Age of Onset  . Heart disease Paternal Grandfather        MI, old age  . Stroke Mother   . Lung cancer Maternal Grandfather        smoker  . Stroke Maternal Grandfather   . Colon cancer Neg Hx   . Colon polyps Neg Hx   . Esophageal cancer Neg Hx   . Rectal cancer Neg Hx   . Stomach cancer Neg Hx   . Bladder Cancer Neg Hx   . Prostate cancer Neg Hx    Social History   Tobacco Use  . Smoking status: Former Smoker    Packs/day: 2.00    Years: 20.00    Pack years: 40.00    Types: Cigarettes    Last attempt to quit: 08/07/1983    Years since quitting: 34.9  . Smokeless tobacco: Never Used  . Tobacco comment: quit over 30 years ago  Substance Use Topics  . Alcohol use: Yes    Alcohol/week: 7.0 standard drinks    Types: 7 Shots of liquor per week    Comment: a drink every evening, scotch or whiskey   Allergies  Allergen Reactions  . Sulfonamide Derivatives Rash   Prior to Admission medications   Medication Sig Start Date End Date Taking? Authorizing Provider  amiodarone (PACERONE) 200 MG tablet Take 1 tablet (200 mg) by mouth two times a day until the end of October, then decrease to 1 tablet (200 mg) by mouth once a day. 05/16/18  Yes End, Harrell Gave, MD  apixaban (ELIQUIS) 5 MG TABS tablet Take 1 tablet (5 mg total) by mouth 2 (two) times daily. 06/11/18  Yes Tonia Ghent, MD  atorvastatin (LIPITOR) 80 MG tablet Take 1 tablet (80 mg total) by mouth daily. 05/16/18 08/14/18 Yes End, Harrell Gave, MD  clopidogrel (PLAVIX) 75 MG tablet Take 1 tablet (75 mg total) by mouth daily. 05/16/18  Yes End, Harrell Gave, MD  diclofenac sodium (VOLTAREN) 1 % GEL Apply 4 g topically 4 (four) times daily as needed. Patient taking differently: Apply 4 g topically 4 (four) times daily as needed (knee pain.).  08/05/17  Yes Tonia Ghent, MD  doxazosin (CARDURA) 4 MG tablet Take 1 tablet (4 mg total) by mouth daily after  breakfast. 05/07/18  Yes Billey Co, MD  finasteride (PROSCAR) 5 MG tablet Take 1 tablet (5 mg total) by mouth daily. 05/07/18  Yes Billey Co, MD  furosemide (LASIX) 20 MG tablet Take 1 tablet (20 mg total) by mouth daily. 05/16/18  Yes End, Harrell Gave, MD  levothyroxine (SYNTHROID, LEVOTHROID) 150 MCG tablet Take 1 tablet a day except for 1/2 tablet on Sundays.  Total of 6.5 tablets/week. Patient taking differently: Take 150 mcg by mouth See admin instructions. Take 1 tablet a day except for 1/2 tablet on Sundays.  Total of 6.5 tablets/week. 02/12/18  Yes Tonia Ghent, MD  metoprolol succinate (TOPROL-XL) 25 MG 24 hr tablet Take 0.5 tablets (12.5  mg total) by mouth daily. Take with or immediately following a meal. 05/16/18 07/07/18 Yes End, Harrell Gave, MD  Multiple Vitamin (MULTIVITAMIN WITH MINERALS) TABS tablet Take 1 tablet by mouth daily. Senior Multivitamin   Yes [provider]  psyllium (METAMUCIL) 58.6 % powder Take 1 packet by mouth daily.    Yes [provider]  ramipril (ALTACE) 10 MG capsule Take 1 capsule (10 mg total) by mouth daily. 05/16/18  Yes End, Harrell Gave, MD  DHA-EPA-Coenzyme Q10-Vitamin E (CO Q-10 VITAMIN E FISH OIL PO) Take 1 capsule by mouth daily.    [provider]    Review of Systems  Constitutional: Positive for fatigue (improving). Negative for appetite change.  Eyes: Negative.   Respiratory: Negative for chest tightness and shortness of breath.   Cardiovascular: Negative for chest pain, palpitations and leg swelling.  Gastrointestinal: Negative for abdominal distention and abdominal pain.  Endocrine: Negative.   Genitourinary: Negative.   Musculoskeletal: Positive for arthralgias (left knee at times). Negative for back pain.  Skin: Negative.   Allergic/Immunologic: Negative.   Neurological: Negative for dizziness and light-headedness.  Hematological: Negative for adenopathy. Bruises/bleeds easily.   Psychiatric/Behavioral: Negative for dysphoric mood and sleep disturbance. The patient is not nervous/anxious.    Vitals:   07/07/18 0845  BP: (!) 151/78  Pulse: (!) 57  Resp: 18  SpO2: 100%  Weight: 266 lb 4 oz (120.8 kg)  Height: 6' (1.829 m)   Wt Readings from Last 3 Encounters:  07/07/18 266 lb 4 oz (120.8 kg)  05/22/18 260 lb 8 oz (118.2 kg)  05/16/18 261 lb 1.9 oz (118.4 kg)   Lab Results  Component Value Date   CREATININE 1.18 05/14/2018   CREATININE 1.22 05/13/2018   CREATININE 1.36 (H) 05/10/2018    Physical Exam  Constitutional: He is oriented to person, place, and time. He appears well-developed and well-nourished.  HENT:  Head: Normocephalic and atraumatic.  Neck: Normal range of motion. Neck supple. No JVD present.  Cardiovascular: Normal rate and regular rhythm.  Pulmonary/Chest: Effort normal. No respiratory distress. He has no wheezes. He has no rales.  Abdominal: Soft. He exhibits no distension.  Musculoskeletal: He exhibits no edema or tenderness.  Neurological: He is alert and oriented to person, place, and time.  Skin: Skin is warm and dry.  Psychiatric: He has a normal mood and affect. His behavior is normal. Thought content normal.  Nursing note and vitals reviewed.   Assessment & Plan:  1: Chronic heart failure with preserved ejection fraction- - NYHA class II - euvolemic today - weighing daily and he was reminded to call for an overnight weight gain of >2 pounds or a weekly weight gain of >5 pounds; home weight chart was reviewed - weight up ~ 6 pounds from last visit 6 weeks ago - not adding salt but says that he doesn't really know how much sodium he's eating. Reviewed the importance of reading food labels so that he can keep his daily sodium intake to <20109m daily.  - saw cardiology (End) 05/16/18 - BNP 05/09/18 was 290.0 - has received his flu vaccine for this season - [facility]  2: Atrial  fibrillation- - had cardioversion 05/06/18  3: HTN- - BP mildly elevated today; home BP chart reviewed and they are mostly <120/ 80's - saw PCP (Damita Dunnings 02/11/18 - BMP 05/14/18 reviewed and showed sodium 140, potassium 3.6, creatinine 1.18 and GFR >60  Medication bottles were reviewed.  Return  in 6 months or sooner for any questions/problems before then.

## 2018-07-07 ENCOUNTER — Encounter: Payer: Self-pay | Admitting: Family

## 2018-07-07 ENCOUNTER — Ambulatory Visit: Payer: Medicare Other | Attending: Family | Admitting: Family

## 2018-07-07 VITALS — BP 151/78 | HR 57 | Resp 18 | Ht 72.0 in | Wt 266.2 lb

## 2018-07-07 DIAGNOSIS — I5032 Chronic diastolic (congestive) heart failure: Secondary | ICD-10-CM | POA: Diagnosis not present

## 2018-07-07 DIAGNOSIS — Z823 Family history of stroke: Secondary | ICD-10-CM | POA: Diagnosis not present

## 2018-07-07 DIAGNOSIS — E785 Hyperlipidemia, unspecified: Secondary | ICD-10-CM | POA: Diagnosis not present

## 2018-07-07 DIAGNOSIS — I10 Essential (primary) hypertension: Secondary | ICD-10-CM

## 2018-07-07 DIAGNOSIS — Z85828 Personal history of other malignant neoplasm of skin: Secondary | ICD-10-CM | POA: Insufficient documentation

## 2018-07-07 DIAGNOSIS — G4733 Obstructive sleep apnea (adult) (pediatric): Secondary | ICD-10-CM | POA: Insufficient documentation

## 2018-07-07 DIAGNOSIS — Z7901 Long term (current) use of anticoagulants: Secondary | ICD-10-CM | POA: Insufficient documentation

## 2018-07-07 DIAGNOSIS — Z8249 Family history of ischemic heart disease and other diseases of the circulatory system: Secondary | ICD-10-CM | POA: Diagnosis not present

## 2018-07-07 DIAGNOSIS — I480 Paroxysmal atrial fibrillation: Secondary | ICD-10-CM

## 2018-07-07 DIAGNOSIS — Z882 Allergy status to sulfonamides status: Secondary | ICD-10-CM | POA: Insufficient documentation

## 2018-07-07 DIAGNOSIS — Z79899 Other long term (current) drug therapy: Secondary | ICD-10-CM | POA: Diagnosis not present

## 2018-07-07 DIAGNOSIS — Z87891 Personal history of nicotine dependence: Secondary | ICD-10-CM | POA: Insufficient documentation

## 2018-07-07 DIAGNOSIS — Z955 Presence of coronary angioplasty implant and graft: Secondary | ICD-10-CM | POA: Diagnosis not present

## 2018-07-07 DIAGNOSIS — R011 Cardiac murmur, unspecified: Secondary | ICD-10-CM | POA: Insufficient documentation

## 2018-07-07 DIAGNOSIS — I110 Hypertensive heart disease with heart failure: Secondary | ICD-10-CM | POA: Diagnosis not present

## 2018-07-07 DIAGNOSIS — I4891 Unspecified atrial fibrillation: Secondary | ICD-10-CM | POA: Diagnosis not present

## 2018-07-07 NOTE — Patient Instructions (Signed)
Continue weighing daily and call for an overnight weight gain of > 2 pounds or a weekly weight gain of >5 pounds. 

## 2018-07-11 ENCOUNTER — Telehealth: Payer: Self-pay

## 2018-07-11 DIAGNOSIS — S5001XA Contusion of right elbow, initial encounter: Secondary | ICD-10-CM | POA: Diagnosis not present

## 2018-07-11 DIAGNOSIS — S40022A Contusion of left upper arm, initial encounter: Secondary | ICD-10-CM | POA: Diagnosis not present

## 2018-07-11 NOTE — Telephone Encounter (Signed)
Pt said he had stint put in 05/2018 by Dr End. Today pt found 2" wide bruise that is puffy and tender to touch on lt arm and inside of rt arm has 6" bruise. No difficulty breathing.Pt went to Dr Marisue Humble office but it is closed and R Baity NP advised pt could go to [facility]. Pt is presently at [facility] and will go to [facility]. FYI to Dr Damita Dunnings.

## 2018-07-13 NOTE — Telephone Encounter (Signed)
Thanks.   Please get an update Monday.

## 2018-07-14 NOTE — Telephone Encounter (Signed)
Patient says he went to [facility] In and the MD said he was not concerned about the bruising and patient says it is clearing up quite a bit now and he is doing well.  Patient thanks [place] for the phone call.

## 2018-07-15 ENCOUNTER — Other Ambulatory Visit: Payer: Self-pay | Admitting: *Deleted

## 2018-07-15 NOTE — Telephone Encounter (Signed)
Faxed refill request. Metoprolol ER 25 mg Last office visit:   02/11/18 Last Filled:   By Dr. Saunders Revel  45 tablet 2 05/16/2018 07/07/2018  Please advise.

## 2018-07-16 ENCOUNTER — Other Ambulatory Visit
Admission: RE | Admit: 2018-07-16 | Discharge: 2018-07-16 | Disposition: A | Discharge status: Home / Self Care | Payer: Medicare Other | Source: Ambulatory Visit | Attending: Internal Medicine | Admitting: Internal Medicine

## 2018-07-16 DIAGNOSIS — E785 Hyperlipidemia, unspecified: Secondary | ICD-10-CM | POA: Diagnosis not present

## 2018-07-16 DIAGNOSIS — Z79899 Other long term (current) drug therapy: Secondary | ICD-10-CM | POA: Insufficient documentation

## 2018-07-16 LAB — COMPREHENSIVE METABOLIC PANEL
ALT: 14 U/L (ref 0–44)
AST: 18 U/L (ref 15–41)
Albumin: 3.6 g/dL (ref 3.5–5.0)
Alkaline Phosphatase: 59 U/L (ref 38–126)
Anion gap: 7 (ref 5–15)
BUN: 19 mg/dL (ref 8–23)
CO2: 26 mmol/L (ref 22–32)
CREATININE: 1.34 mg/dL — AB (ref 0.61–1.24)
Calcium: 9 mg/dL (ref 8.9–10.3)
Chloride: 106 mmol/L (ref 98–111)
GFR calc non Af Amer: 53 mL/min — ABNORMAL LOW (ref >60)
GLUCOSE: 94 mg/dL (ref 70–99)
POTASSIUM: 4.2 mmol/L (ref 3.5–5.1)
SODIUM: 139 mmol/L (ref 135–145)
TOTAL PROTEIN: 6.4 g/dL — AB (ref 6.5–8.1)
Total Bilirubin: 1.1 mg/dL (ref 0.3–1.2)

## 2018-07-16 LAB — LIPID PANEL
Cholesterol: 146 mg/dL (ref 0–200)
HDL: 40 mg/dL — AB (ref >40)
LDL CALC: 87 mg/dL (ref 0–99)
TRIGLYCERIDES: 94 mg/dL (ref <150)
Total CHOL/HDL Ratio: 3.7 RATIO
VLDL: 19 mg/dL (ref 0–40)

## 2018-07-16 MED ORDER — METOPROLOL SUCCINATE ER 25 MG PO TB24: 12.5000 mg | ORAL_TABLET | Freq: Every day | ORAL | 2 refills | Status: DC

## 2018-07-16 NOTE — Telephone Encounter (Signed)
Noted. Thanks.

## 2018-07-16 NOTE — Telephone Encounter (Signed)
Sent. Thanks.   

## 2018-07-17 ENCOUNTER — Telehealth: Payer: Self-pay | Admitting: *Deleted

## 2018-07-17 DIAGNOSIS — E785 Hyperlipidemia, unspecified: Secondary | ICD-10-CM

## 2018-07-17 DIAGNOSIS — I10 Essential (primary) hypertension: Secondary | ICD-10-CM

## 2018-07-17 DIAGNOSIS — Z79899 Other long term (current) drug therapy: Secondary | ICD-10-CM

## 2018-07-17 MED ORDER — EZETIMIBE 10 MG PO TABS: 10.0000 mg | ORAL_TABLET | Freq: Every day | ORAL | 3 refills | Status: DC

## 2018-07-17 NOTE — Telephone Encounter (Signed)
-----   Message from Nelva Bush, MD sent at 07/16/2018 11:25 PM EST ----- Please let Mr. Romanello know that his cholesterol has improved, though his LDL still remains above goal.  Please confirm that he is taking atorvastatin 80 mg daily.  If so, I recommend adding ezetimibe 10 mg daily and repeating a lipid panel and ALT in approximately 3 months.  Creatinine is also slightly above baseline.  I encourage Mr. Current to stay well-hydrated.

## 2018-07-17 NOTE — Telephone Encounter (Signed)
Results called to pt. Pt verbalized understanding of resutls and plan of care. He has been taking his atorvastatin as prescribed. He is agreeable to start Zetia and verbalized understanding to go to the [facility] in 3 months (around mid-March) for lab work and knows to be fasting. Rx sent to pharmacy and lab orders entered.

## 2018-07-31 ENCOUNTER — Telehealth: Payer: Self-pay | Admitting: *Deleted

## 2018-07-31 NOTE — Telephone Encounter (Addendum)
Fax received requesting a refill on Simvastatin 40 mg. From [facility]. I don't see this medication on the patient's current meds list.  Please advise.

## 2018-08-01 ENCOUNTER — Ambulatory Visit: Payer: Medicare Other

## 2018-08-05 ENCOUNTER — Other Ambulatory Visit: Payer: Self-pay | Admitting: Family Medicine

## 2018-08-06 NOTE — Telephone Encounter (Signed)
Please deny this.  According to the EMR, he has been changed to Lipitor.  Thanks.

## 2018-08-08 ENCOUNTER — Ambulatory Visit (INDEPENDENT_AMBULATORY_CARE_PROVIDER_SITE_OTHER): Payer: Medicare Other | Admitting: Family Medicine

## 2018-08-08 ENCOUNTER — Ambulatory Visit: Payer: Medicare Other

## 2018-08-08 ENCOUNTER — Encounter: Payer: Self-pay | Admitting: Family Medicine

## 2018-08-08 VITALS — BP 122/76 | HR 64 | Temp 97.9°F | Ht 73.5 in | Wt 266.0 lb

## 2018-08-08 DIAGNOSIS — Z7189 Other specified counseling: Secondary | ICD-10-CM

## 2018-08-08 DIAGNOSIS — R351 Nocturia: Secondary | ICD-10-CM

## 2018-08-08 DIAGNOSIS — Z0000 Encounter for general adult medical examination without abnormal findings: Secondary | ICD-10-CM

## 2018-08-08 DIAGNOSIS — E039 Hypothyroidism, unspecified: Secondary | ICD-10-CM

## 2018-08-08 DIAGNOSIS — E7800 Pure hypercholesterolemia, unspecified: Secondary | ICD-10-CM

## 2018-08-08 DIAGNOSIS — I10 Essential (primary) hypertension: Secondary | ICD-10-CM

## 2018-08-08 DIAGNOSIS — N401 Enlarged prostate with lower urinary tract symptoms: Secondary | ICD-10-CM | POA: Diagnosis not present

## 2018-08-08 DIAGNOSIS — M25569 Pain in unspecified knee: Secondary | ICD-10-CM

## 2018-08-08 DIAGNOSIS — I2510 Atherosclerotic heart disease of native coronary artery without angina pectoris: Secondary | ICD-10-CM

## 2018-08-08 MED ORDER — AMIODARONE HCL 200 MG PO TABS: ORAL_TABLET | ORAL | Status: DC

## 2018-08-08 NOTE — Patient Instructions (Signed)
James [name] , Thank you for taking time to come for your Medicare Wellness Visit. I appreciate your ongoing commitment to your health goals. Please review the following plan we discussed and let me know if I can assist you in the future.   These are the goals we discussed: Goals    . Increase physical activity     Starting 08/08/18, I will continue to exercise for 30 minutes daily.        This is a list of the screening recommended for you and due dates:  Health Maintenance  Topic Date Due  . Tetanus Vaccine  08/08/2048*  . Colon Cancer Screening  08/22/2019  . Flu Shot  Completed  .  Hepatitis C: One time screening is recommended by Center for Disease Control  (CDC) for  adults born from [birth year] through 1964.   Completed  . Pneumonia vaccines  Completed  *Topic was postponed. The date shown is not the original due date.   Preventive Care for Adults  A healthy lifestyle and preventive care can promote health and wellness. Preventive health guidelines for adults include the following key practices.  . A routine yearly physical is a good way to check with your health care provider about your health and preventive screening. It is a chance to share any concerns and updates on your health and to receive a thorough exam.  . Visit your dentist for a routine exam and preventive care every 6 months. Brush your teeth twice a day and floss once a day. Good oral hygiene prevents tooth decay and gum disease.  . The frequency of eye exams is based on your age, health, family medical history, use  of contact lenses, and other factors. Follow your health care provider's recommendations for frequency of eye exams.  . Eat a healthy diet. Foods like vegetables, fruits, whole grains, low-fat dairy products, and lean protein foods contain the nutrients you need without too many calories. Decrease your intake of foods high in solid fats, added sugars, and salt. Eat the right amount of calories for you. Get  information about a proper diet from your health care provider, if necessary.  . Regular physical exercise is one of the most important things you can do for your health. Most adults should get at least 150 minutes of moderate-intensity exercise (any activity that increases your heart rate and causes you to sweat) each week. In addition, most adults need muscle-strengthening exercises on 2 or more days a week.  Silver Sneakers may be a benefit available to you. To determine eligibility, you may visit the website: www.silversneakers.com or contact program at 201-717-5849 Mon-Fri between 8AM-8PM.   . Maintain a healthy weight. The body mass index (BMI) is a screening tool to identify possible weight problems. It provides an estimate of body fat based on height and weight. Your health care provider can find your BMI and can help you achieve or maintain a healthy weight.   For adults 20 years and older: ? A BMI below 18.5 is considered underweight. ? A BMI of 18.5 to 24.9 is normal. ? A BMI of 25 to 29.9 is considered overweight. ? A BMI of 30 and above is considered obese.   . Maintain normal blood lipids and cholesterol levels by exercising and minimizing your intake of saturated fat. Eat a balanced diet with plenty of fruit and vegetables. Blood tests for lipids and cholesterol should begin at age 31 and be repeated every 5 years. If your lipid or  cholesterol levels are high, you are over 50, or you are at high risk for heart disease, you may need your cholesterol levels checked more frequently. Ongoing high lipid and cholesterol levels should be treated with medicines if diet and exercise are not working.  . If you smoke, find out from your health care provider how to quit. If you do not use tobacco, please do not start.  . If you choose to drink alcohol, please do not consume more than 2 drinks per day. One drink is considered to be 12 ounces (355 mL) of beer, 5 ounces (148 mL) of wine, or 1.5  ounces (44 mL) of liquor.  . If you are 3-68 years old, ask your health care provider if you should take aspirin to prevent strokes.  . Use sunscreen. Apply sunscreen liberally and repeatedly throughout the day. You should seek shade when your shadow is shorter than you. Protect yourself by wearing long sleeves, pants, a wide-brimmed hat, and sunglasses year round, whenever you are outdoors.  . Once a month, do a whole body skin exam, using a mirror to look at the skin on your back. Tell your health care provider of new moles, moles that have irregular borders, moles that are larger than a pencil eraser, or moles that have changed in shape or color.

## 2018-08-08 NOTE — Progress Notes (Signed)
Subjective:   James [name] is a 73 y.o. male who presents for Medicare Annual/Subsequent preventive examination.  Review of Systems:  N/A Cardiac Risk Factors include: advanced age (>65mn, >>24women);dyslipidemia;hypertension;male gender;obesity (BMI >30kg/m2)     Objective:    Vitals: BP 122/76 (BP Location: Right Arm, Patient Position: Sitting, Cuff Size: Normal)   Pulse 64   Temp 97.9 F (36.6 C) (Oral)   Ht 6' 1.5" (1.867 m) Comment: shoes  Wt 266 lb (120.7 kg)   SpO2 96%   BMI 34.62 kg/m   Body mass index is 34.62 kg/m.  Advanced Directives 08/08/2018 05/13/2018 05/12/2018 05/09/2018 05/08/2018 05/06/2018 07/25/2017  Does Patient Have a Medical Advance Directive? Yes Yes Yes Yes Yes Yes Yes  Type of AParamedicof ASt. MarysLiving will - Living will;Healthcare Power of AFair OaksLiving will HPrichardLiving will [place]rem[place]Living will [place]Living will  Does patient want to make changes to medical advance directive? - - No - Patient declined No - Patient declined No - Patient declined No - Patient declined -  Copy of HOlneyin Chart? No - copy requested - No - copy requested No - copy requested No - copy requested No - copy requested No - copy requested    Tobacco Social History   Tobacco Use  Smoking Status Former Smoker  . Packs/day: 2.00  . Years: 20.00  . Pack years: 40.00  . Types: Cigarettes  . Last attempt to quit: 08/07/1983  . Years since quitting: 35.0  Smokeless Tobacco Never Used  Tobacco Comment   quit over 30 years ago     Counseling given: No Comment: quit over 30 years ago   Clinical Intake:  Pre-visit preparation completed: Yes  Pain : 0-10 Pain Score: 3  Pain Type: Chronic pain Pain Location: Knee Pain Orientation: Left Pain Onset: More than a month ago Pain Frequency: Constant     Nutritional Status: BMI  > 30  Obese Diabetes: No  How often do you need to have someone help you when you read instructions, pamphlets, or other written materials from your doctor or pharmacy?: 1 - Never What is the last grade level you completed in school?: Associate degree  Interpreter Needed?: No  Comments: pt lives with spouse Information entered by :: LPinson, LPN  Past Medical History:  Diagnosis Date  . Acute lower GI bleeding after colonoscopy and polypectomy, resolved 12/12/2015  . Cardiac murmur    a. 02/2018 Echo: EF 60-65%, no rwma, mild AI, mildly dil Ao root/Asc Ao/Arch, Mild MR, mildly dil LA. Nl RV fxn.  . Cataract    bilateral surgery to remove  . CHF (congestive heart failure) ([place]   . Colon polyp   . Constipation   . Dilated aortic root (HG[place]    a. 02/2018 Echo: mildly dil Ao root/asc ao/arch.  . Essential hypertension   . Hemorrhoids   . Hepatitis    self resolved, likely food exposure, 1974.   .Marland KitchenHistory of cardiovascular stress test    a. 02/2018 Myoview: EF 55-65%, small/mild apical defect w/ nl wall motion ->attenuation artifact. No ischemia. Low risk study.  .Marland KitchenHx of adenomatous colonic polyps 12/05/2015  . Hyperlipidemia   . Hypothyroidism   . Obstructive sleep apnea 05/23/2018  . Osteoarthritis   . Persistent atrial fibrillation    a. Dx 02/2018. CHA2DS2VASc = 2-->Eliquis; b. 03/2018 DCCV (200J); c. 03/2018 recurrent Afib-->flecainide started 04/2018;  d. 05/06/2018 s/p successful DCCV - 200J x 2.  . Skin cancer    basal cell, R ear, MOHS   Past Surgical History:  Procedure Laterality Date  . BROW LIFT Bilateral 10/23/2016   Procedure: BLEPHAROPLASTY upper eyelid with excess skin;  Surgeon: Karle Starch, MD;  Location: [facility];  Service: Ophthalmology;  Laterality: Bilateral;  MAC  . CARDIOVERSION N/A 03/18/2018   Procedure: CARDIOVERSION;  Surgeon: Nelva Bush, MD;  Location: [facility];  Service: Cardiovascular;  Laterality: N/A;  . CARDIOVERSION N/A 05/06/2018    Procedure: CARDIOVERSION;  Surgeon: Nelva Bush, MD;  Location: [facility];  Service: Cardiovascular;  Laterality: N/A;  . CATARACT EXTRACTION  1986   OD  . CATARACT EXTRACTION Bilateral 1995   x 2 for right and left   . COLONOSCOPY    . CORONARY STENT INTERVENTION N/A 05/13/2018   Procedure: CORONARY STENT INTERVENTION;  Surgeon: Nelva Bush, MD;  Location: [facility];  Service: Cardiovascular;  Laterality: N/A;  . ECTROPION REPAIR Bilateral 10/23/2016   Procedure: REPAIR OF ECTROPION sutures, extensive;  Surgeon: Karle Starch, MD;  Location: [facility];  Service: Ophthalmology;  Laterality: Bilateral;  . LIPOMA EXCISION  06/1997   left neck ([place])  . MOHS SURGERY Right 2013   behind right ear  . RIGHT/LEFT HEART CATH AND CORONARY ANGIOGRAPHY N/A 05/12/2018   Procedure: RIGHT/LEFT HEART CATH AND CORONARY ANGIOGRAPHY;  Surgeon: Wellington Hampshire, MD;  Location: [facility];  Service: Cardiovascular;  Laterality: N/A;  . TONSILLECTOMY    . VASECTOMY  1982   Family History  Problem Relation Age of Onset  . Heart disease Paternal Grandfather        MI, old age  . Stroke Mother   . Lung cancer Maternal Grandfather        smoker  . Stroke Maternal Grandfather   . Colon cancer Neg Hx   . Colon polyps Neg Hx   . Esophageal cancer Neg Hx   . Rectal cancer Neg Hx   . Stomach cancer Neg Hx   . Bladder Cancer Neg Hx   . Prostate cancer Neg Hx    Social History   Socioeconomic History  . Marital status: Married    Spouse name: Remo Lipps  . Number of children: 1  . Years of education: 16  . Highest education level: Associate degree: occupational, Hotel manager, or vocational program  Occupational History  . Occupation: Retired    Fish farm manager: RETIRED    Comment: 1  Social Needs  . Financial resource strain: Not on file  . Food insecurity:    Worry: Not on file    Inability: Not on file  . Transportation needs:    Medical: Not on file    Non-medical:  Not on file  Tobacco Use  . Smoking status: Former Smoker    Packs/day: 2.00    Years: 20.00    Pack years: 40.00    Types: Cigarettes    Last attempt to quit: 08/07/1983    Years since quitting: 35.0  . Smokeless tobacco: Never Used  . Tobacco comment: quit over 30 years ago  Substance and Sexual Activity  . Alcohol use: Yes    Alcohol/week: 7.0 standard drinks    Types: 7 Glasses of wine per week  . Drug use: No  . Sexual activity: Not on file  Lifestyle  . Physical activity:    Days per week: Not on file    Minutes per session:  Not on file  . Stress: Not on file  Relationships  . Social connections:    Talks on phone: Not on file    Gets together: Not on file    Attends religious service: Not on file    Active member of club or organization: Not on file    Attends meetings of clubs or organizations: Not on file    Relationship status: Not on file  Other Topics Concern  . Not on file  Social History Narrative   Married and lives with wife 1974   One daughter, not local   Retired from [employer], sea based telecommunication/contracting    Outpatient Encounter Medications as of 08/08/2018  Medication Sig  . amiodarone (PACERONE) 200 MG tablet Take 1 tablet (200 mg) by mouth two times a day until the end of October, then decrease to 1 tablet (200 mg) by mouth once a day.  Marland Kitchen apixaban (ELIQUIS) 5 MG TABS tablet Take 1 tablet (5 mg total) by mouth 2 (two) times daily.  Marland Kitchen atorvastatin (LIPITOR) 80 MG tablet Take 1 tablet (80 mg total) by mouth daily.  . clopidogrel (PLAVIX) 75 MG tablet Take 1 tablet (75 mg total) by mouth daily.  . DHA-EPA-Coenzyme Q10-Vitamin E (CO Q-10 VITAMIN E FISH OIL PO) Take 1 capsule by mouth daily.  . diclofenac sodium (VOLTAREN) 1 % GEL Apply 4 g topically 4 (four) times daily as needed. (Patient taking differently: Apply 4 g topically 4 (four) times daily as needed (knee pain.). )  . doxazosin (CARDURA) 4 MG tablet Take 1 tablet (4 mg total) by mouth daily  after breakfast.  . ezetimibe (ZETIA) 10 MG tablet Take 1 tablet (10 mg total) by mouth daily.  . finasteride (PROSCAR) 5 MG tablet Take 1 tablet (5 mg total) by mouth daily.  . furosemide (LASIX) 20 MG tablet Take 1 tablet (20 mg total) by mouth daily.  Marland Kitchen levothyroxine (SYNTHROID, LEVOTHROID) 150 MCG tablet Take 1 tablet (150 mcg total) by mouth See admin instructions. Take 1 tablet a day except for 1/2 tablet on Sundays.  Total of 6.5 tablets/week.  . metoprolol succinate (TOPROL-XL) 25 MG 24 hr tablet Take 0.5 tablets (12.5 mg total) by mouth daily. Take with or immediately following a meal.  . Multiple Vitamin (MULTIVITAMIN WITH MINERALS) TABS tablet Take 1 tablet by mouth daily. Senior Multivitamin  . psyllium (METAMUCIL) 58.6 % powder Take 1 packet by mouth daily.   . ramipril (ALTACE) 10 MG capsule Take 1 capsule (10 mg total) by mouth daily.   No facility-administered encounter medications on file as of 08/08/2018.     Activities of Daily Living In your present state of health, do you have any difficulty performing the following activities: 08/08/2018 07/07/2018  Hearing? N N  Vision? N N  Difficulty concentrating or making decisions? N N  Walking or climbing stairs? Tempie Donning  Comment difficulty with climbing stairs due to knee pain -  Dressing or bathing? N N  Doing errands, shopping? N N  Preparing Food and eating ? N -  Using the Toilet? N -  In the past six months, have you accidently leaked urine? N -  Do you have problems with loss of bowel control? N -  Managing your Medications? N -  Managing your Finances? N -  Housekeeping or managing your Housekeeping? N -  Some recent data might be hidden    Patient Care Team: Tonia Ghent, MD as PCP - General (Family Medicine) End, Harrell Gave,  MD as PCP - Cardiology (Cardiology)   Assessment:   This is a routine wellness examination for [facility].   Hearing Screening   125Hz  250Hz  500Hz  1000Hz  2000Hz  3000Hz  4000Hz  6000Hz  8000Hz     Right ear:   40 40 40  40    Left ear:   40 40 40  0    Vision Screening Comments: Vision exam in 2019 with Dr. Edison Pace   Exercise Activities and Dietary recommendations Current Exercise Habits: Home exercise routine, Type of exercise: stretching, Time (Minutes): 30, Frequency (Times/Week): 7, Weekly Exercise (Minutes/Week): 210, Intensity: Mild, Exercise limited by: orthopedic condition(s);cardiac condition(s)  Goals    . Increase physical activity     Starting 08/08/18, I will continue to exercise for 30 minutes daily.        Fall Risk Fall Risk  08/08/2018 07/07/2018 05/22/2018 07/25/2017 07/23/2016  Falls in the past year? 0 0 No No No  Number falls in past yr: - 0 - - -  Injury with Fall? - 0 - - -   Depression Screen PHQ 2/9 Scores 08/08/2018 05/22/2018 07/25/2017 07/23/2016  PHQ - 2 Score 0 0 0 0  PHQ- 9 Score 0 - 0 -    Cognitive Function MMSE - Mini Mental State Exam 08/08/2018 07/25/2017  Orientation to time 5 5  Orientation to Place 5 5  Registration 3 3  Attention/ Calculation 0 0  Recall 3 3  Language- name 2 objects 0 0  Language- repeat 1 1  Language- follow 3 step command 3 3  Language- read & follow direction 0 0  Write a sentence 0 0  Copy design 0 0  Total score 20 20       PLEASE NOTE: A Mini-Cog screen was completed. Maximum score is 20. A value of 0 denotes this part of Folstein MMSE was not completed or the patient failed this part of the Mini-Cog screening.   Mini-Cog Screening Orientation to Time - Max 5 pts Orientation to Place - Max 5 pts Registration - Max 3 pts Recall - Max 3 pts Language Repeat - Max 1 pts Language Follow 3 Step Command - Max 3 pts   Immunization History  Administered Date(s) Administered  . Influenza, High Dose Seasonal PF 04/14/2017  . Influenza,inj,Quad PF,6+ Mos 04/16/2018  . Influenza-Unspecified 05/06/2013, 04/20/2014, 05/07/2015, 04/06/2016  . Pneumococcal Conjugate-13 07/20/2014  . Pneumococcal Polysaccharide-23  07/16/2013  . Td 02/19/2008  . Zoster 09/30/2007    Screening Tests Health Maintenance  Topic Date Due  . TETANUS/TDAP  08/08/2048 (Originally 02/18/2018)  . COLONOSCOPY  08/22/2019  . INFLUENZA VACCINE  Completed  . Hepatitis C Screening  Completed  . PNA vac Low Risk Adult  Completed     Plan:     I have personally reviewed, addressed, and noted the following in the patient's chart:  A. Medical and social history B. Use of alcohol, tobacco or illicit drugs  C. Current medications and supplements D. Functional ability and status E.  Nutritional status F.  Physical activity G. Advance directives H. List of other physicians I.  Hospitalizations, surgeries, and ER visits in previous 12 months J.  [place] to include hearing, vision, cognitive, depression L. Referrals and appointments - none  In addition, I have reviewed and discussed with patient certain preventive protocols, quality metrics, and best practice recommendations. A written personalized care plan for preventive services as well as general preventive health recommendations were provided to patient.  See attached scanned questionnaire for additional  information.   Signed,   Lindell Noe, MHA, BS, LPN Health Coach

## 2018-08-08 NOTE — Progress Notes (Signed)
Hearing- failed.  Declined hearing aids.   Shingles d/w pt.  out of stock.   Flu 2019 Tetanus 2009.  See avs.   PNA up to date.  Prostate cancer screening and PSA options (with potential risks and benefits of testing vs not testing) were discussed along with recent recs/guidelines.  He declined testing PSA at this point. Colonoscopy up to date, due 2021.  Wife would be designated if patient were incapacitated.   Diet and exercise d/w pt.    LUTS controlled with current meds.  Declined PSA at this point.    Hypertension:    Using medication without problems or lightheadedness: yes Chest pain with exertion:no Edema:no Short of breath:no  Elevated Cholesterol: Using medications without problems: He is going to start zetia soon, his shipment was delayed.   Muscle aches: no Diet compliance: yes Exercise: limited by knee pain.   Hypothyroidism.  No neck mass, no lumps.  No dysphagia.  Compliant. Prev labs d/w pt.   OA/knee pain better with diclofenac gel.  No ADE on med.  It helps.    CAD.  D/w pt.  S/p stent in 05/2018. AF on anticoagulation.  Some bruising as expected.  No bleeding o/w .  No chest pain.  He feels well.  PMH and SH reviewed  ROS: Per HPI unless specifically indicated in ROS section   Meds, vitals, and allergies reviewed.   GEN: nad, alert and oriented HEENT: mucous membranes moist NECK: supple w/o LA CV: RRR (he hasn't noted skipped beats, d/w pt) PULM: ctab, no inc wob ABD: soft, +bs EXT: trace BLE edema SKIN: no acute rash

## 2018-08-08 NOTE — Progress Notes (Signed)
AWV note has been forwarded for your review

## 2018-08-08 NOTE — Patient Instructions (Addendum)
Check on getting a Tdap at the pharmacy.   Don't change your meds for now.  Update me as needed.  Take care.  Glad to see you.

## 2018-08-08 NOTE — Progress Notes (Signed)
PCP notes:   Health maintenance:  Tetanus vaccine - postponed/insurance  Abnormal screenings:   Hearing - failed  Hearing Screening   125Hz  250Hz  500Hz  1000Hz  2000Hz  3000Hz  4000Hz  6000Hz  8000Hz   Right ear:   40 40 40  40    Left ear:   40 40 40  0     Patient concerns:   None  Nurse concerns:  None  Next PCP appt:   08/08/2018 @ 0900  I reviewed health advisor's note, was available for consultation on the day of service listed in this note, and agree with documentation and plan. Elsie Stain, MD.

## 2018-08-10 NOTE — Assessment & Plan Note (Signed)
OA/knee pain better with diclofenac gel.  No ADE on med.  It helps.   Surgery has been deferred at this point.  He agrees.

## 2018-08-10 NOTE — Assessment & Plan Note (Signed)
Hearing- failed.  Declined hearing aids.   Shingles d/w pt.  out of stock.   Flu 2019 Tetanus 2009.  See avs.   PNA up to date.  Prostate cancer screening and PSA options (with potential risks and benefits of testing vs not testing) were discussed along with recent recs/guidelines.  He declined testing PSA at this point. Colonoscopy up to date, due 2021.  Wife would be designated if patient were incapacitated.    Diet and exercise d/w pt.

## 2018-08-10 NOTE — Assessment & Plan Note (Signed)
No neck mass, no lumps.  No dysphagia.  Compliant. Prev labs d/w pt.

## 2018-08-10 NOTE — Assessment & Plan Note (Signed)
LUTS controlled with current meds.  Declined PSA at this point.

## 2018-08-10 NOTE — Assessment & Plan Note (Signed)
Reasonable control.  No change in meds.  Continue as is.  He agrees.

## 2018-08-10 NOTE — Assessment & Plan Note (Signed)
Continue Lipitor.  He is getting ready to add on Zetia in the near future.  Update me as needed.  He agrees.

## 2018-08-10 NOTE — Assessment & Plan Note (Addendum)
D/w pt.  S/p stent in 05/2018. AF on anticoagulation.  Some bruising as expected.  No bleeding o/w .  No chest pain.  He feels well.  Previous inpatient course discussed with patient. >25 minutes spent in face to face time with patient, >50% spent in counselling or coordination of care.

## 2018-08-12 DIAGNOSIS — Z0120 Encounter for dental examination and cleaning without abnormal findings: Secondary | ICD-10-CM | POA: Diagnosis not present

## 2018-08-18 ENCOUNTER — Encounter: Payer: Self-pay | Admitting: Internal Medicine

## 2018-08-18 ENCOUNTER — Ambulatory Visit: Payer: Medicare Other | Admitting: Internal Medicine

## 2018-08-18 VITALS — BP 112/64 | HR 50 | Ht 73.0 in | Wt 268.0 lb

## 2018-08-18 DIAGNOSIS — I10 Essential (primary) hypertension: Secondary | ICD-10-CM

## 2018-08-18 DIAGNOSIS — I4819 Other persistent atrial fibrillation: Secondary | ICD-10-CM

## 2018-08-18 DIAGNOSIS — E785 Hyperlipidemia, unspecified: Secondary | ICD-10-CM | POA: Insufficient documentation

## 2018-08-18 DIAGNOSIS — I5032 Chronic diastolic (congestive) heart failure: Secondary | ICD-10-CM | POA: Diagnosis not present

## 2018-08-18 DIAGNOSIS — I2510 Atherosclerotic heart disease of native coronary artery without angina pectoris: Secondary | ICD-10-CM

## 2018-08-18 DIAGNOSIS — Z79899 Other long term (current) drug therapy: Secondary | ICD-10-CM

## 2018-08-18 MED ORDER — ATORVASTATIN CALCIUM 80 MG PO TABS: 80.0000 mg | ORAL_TABLET | Freq: Every day | ORAL | 3 refills | Status: DC

## 2018-08-18 NOTE — Progress Notes (Signed)
Follow-up Outpatient Visit Date: 08/18/2018  Primary Care Provider: Tonia Ghent, MD [place] [address]  Chief Complaint: Follow-up coronary artery disease and atrial fibrillation  HPI:  [name] is a 73 y.o. year-old male with history of coronary artery disease status post PCI to the RCA (05/2018), HFpEF, persistent atrial fibrillation, hypertension, hyperlipidemia, hypothyroidism, and obesity, who presents for follow-up of CAD, atrial fibrillation, and HFpEF, and atrial fibrillation.  I last saw [name] in mid October following his preceding hospitalization.  At the time he was doing well other than bruising and mild soreness in the right groin at diagnostic catheterization site.  We agreed to continue tapering amiodarone to 200 mg daily as well as transition from simvastatin to atorvastatin 80 mg daily.  Follow-up lipid panel was still suboptimal.  Therefore, ezetimibe was added last month.  Today, [name] reports feeling relatively well.  He notes that his energy is not great but otherwise he feels okay.  He denies chest pain, shortness of breath, palpitations, lightheadedness, and edema.  He bruises easily but has not had any significant bleeding, remaining on clopidogrel and apixaban.  Right femoral catheterization site has healed and is no longer tender.  --------------------------------------------------------------------------------------------------  Past Medical History:  Diagnosis Date  . Acute lower GI bleeding after colonoscopy and polypectomy, resolved 12/12/2015  . Cardiac murmur    a. 02/2018 Echo: EF 60-65%, no rwma, mild AI, mildly dil Ao root/Asc Ao/Arch, Mild MR, mildly dil LA. Nl RV fxn.  . Cataract    bilateral surgery to remove  . CHF (congestive heart failure) ([place])   . Colon polyp   . Constipation   . Dilated aortic root ([place])    a. 02/2018 Echo: mildly dil Ao root/asc ao/arch.  . Essential hypertension   . Hemorrhoids   .  Hepatitis    self resolved, likely food exposure, 1974.   Marland Kitchen History of cardiovascular stress test    a. 02/2018 Myoview: EF 55-65%, small/mild apical defect w/ nl wall motion ->attenuation artifact. No ischemia. Low risk study.  Marland Kitchen Hx of adenomatous colonic polyps 12/05/2015  . Hyperlipidemia   . Hypothyroidism   . Obstructive sleep apnea 05/23/2018  . Osteoarthritis   . Persistent atrial fibrillation    a. Dx 02/2018. CHA2DS2VASc = 2-->Eliquis; b. 03/2018 DCCV (200J); c. 03/2018 recurrent Afib-->flecainide started 04/2018;  d. 05/06/2018 s/p successful DCCV - 200J x 2.  . Skin cancer    basal cell, R ear, MOHS   Past Surgical History:  Procedure Laterality Date  . BROW LIFT Bilateral 10/23/2016   Procedure: BLEPHAROPLASTY upper eyelid with excess skin;  Surgeon: Karle Starch, MD;  Location: [facility];  Service: Ophthalmology;  Laterality: Bilateral;  MAC  . CARDIOVERSION N/A 03/18/2018   Procedure: CARDIOVERSION;  Surgeon: Nelva Bush, MD;  Location: [facility];  Service: Cardiovascular;  Laterality: N/A;  . CARDIOVERSION N/A 05/06/2018   Procedure: CARDIOVERSION;  Surgeon: Nelva Bush, MD;  Location: [facility];  Service: Cardiovascular;  Laterality: N/A;  . CATARACT EXTRACTION  1986   OD  . CATARACT EXTRACTION Bilateral 1995   x 2 for right and left   . COLONOSCOPY    . CORONARY STENT INTERVENTION N/A 05/13/2018   Procedure: CORONARY STENT INTERVENTION;  Surgeon: Nelva Bush, MD;  Location: [facility];  Service: Cardiovascular;  Laterality: N/A;  . ECTROPION REPAIR Bilateral 10/23/2016   Procedure: REPAIR OF ECTROPION sutures, extensive;  Surgeon: Karle Starch, MD;  Location: [facility];  Service: Ophthalmology;  Laterality: Bilateral;  . LIPOMA EXCISION  06/1997   left neck ([place])  . MOHS SURGERY Right 2013   behind right ear  . RIGHT/LEFT HEART CATH AND CORONARY ANGIOGRAPHY N/A 05/12/2018   Procedure: RIGHT/LEFT HEART CATH AND CORONARY ANGIOGRAPHY;   Surgeon: Wellington Hampshire, MD;  Location: [facility];  Service: Cardiovascular;  Laterality: N/A;  . TONSILLECTOMY    . VASECTOMY  1982    Current Meds  Medication Sig  . amiodarone (PACERONE) 200 MG tablet 1 tablet (200 mg) by mouth once a day.  Marland Kitchen apixaban (ELIQUIS) 5 MG TABS tablet Take 1 tablet (5 mg total) by mouth 2 (two) times daily.  Marland Kitchen atorvastatin (LIPITOR) 80 MG tablet Take 1 tablet (80 mg total) by mouth daily.  . clopidogrel (PLAVIX) 75 MG tablet Take 1 tablet (75 mg total) by mouth daily.  . DHA-EPA-Coenzyme Q10-Vitamin E (CO Q-10 VITAMIN E FISH OIL PO) Take 1 capsule by mouth daily.  . diclofenac sodium (VOLTAREN) 1 % GEL Apply 4 g topically 4 (four) times daily as needed. (Patient taking differently: Apply 4 g topically 4 (four) times daily as needed (knee pain.). )  . doxazosin (CARDURA) 4 MG tablet Take 1 tablet (4 mg total) by mouth daily after breakfast.  . ezetimibe (ZETIA) 10 MG tablet Take 1 tablet (10 mg total) by mouth daily.  . finasteride (PROSCAR) 5 MG tablet Take 1 tablet (5 mg total) by mouth daily.  . furosemide (LASIX) 20 MG tablet Take 1 tablet (20 mg total) by mouth daily.  Marland Kitchen levothyroxine (SYNTHROID, LEVOTHROID) 150 MCG tablet Take 1 tablet (150 mcg total) by mouth See admin instructions. Take 1 tablet a day except for 1/2 tablet on Sundays.  Total of 6.5 tablets/week.  . Multiple Vitamin (MULTIVITAMIN WITH MINERALS) TABS tablet Take 1 tablet by mouth daily. Senior Multivitamin  . psyllium (METAMUCIL) 58.6 % powder Take 1 packet by mouth daily.   . ramipril (ALTACE) 10 MG capsule Take 1 capsule (10 mg total) by mouth daily.  . [DISCONTINUED] metoprolol succinate (TOPROL-XL) 25 MG 24 hr tablet Take 0.5 tablets (12.5 mg total) by mouth daily. Take with or immediately following a meal.    Allergies: Sulfonamide derivatives  Social History   Tobacco Use  . Smoking status: Former Smoker    Packs/day: 2.00    Years: 20.00    Pack years: 40.00     Types: Cigarettes    Last attempt to quit: 08/07/1983    Years since quitting: 35.0  . Smokeless tobacco: Never Used  . Tobacco comment: quit over 30 years ago  Substance Use Topics  . Alcohol use: Yes    Alcohol/week: 1.0 standard drinks    Types: 1 Glasses of wine per week    Comment: per day  . Drug use: No    Family History  Problem Relation Age of Onset  . Heart disease Paternal Grandfather        MI, old age  . Stroke Mother   . Lung cancer Maternal Grandfather        smoker  . Stroke Maternal Grandfather   . Colon cancer Neg Hx   . Colon polyps Neg Hx   . Esophageal cancer Neg Hx   . Rectal cancer Neg Hx   . Stomach cancer Neg Hx   . Bladder Cancer Neg Hx   . Prostate cancer Neg Hx     Review of Systems: Patient is  relatively sedentary secondary to knee pain.  Otherwise, a 12-system review of systems was performed and was negative except as noted in the HPI.  --------------------------------------------------------------------------------------------------  Physical Exam: BP 112/64   Pulse (!) 50   Ht _0  (1.854 m)   Wt 268 lb (121.6 kg)   BMI 35.36 kg/m   General: NAD. HEENT: No conjunctival pallor or scleral icterus. Moist mucous membranes.  OP clear. Neck: Supple without lymphadenopathy, thyromegaly, JVD, or HJR.  Lungs: Normal work of breathing. Clear to auscultation bilaterally without wheezes or crackles. Heart: Bradycardic but regular with 1/6 systolic murmur.  No rubs or gallops.  Unable to assess PMI due to body habitus. Abd: Bowel sounds present. Soft, NT/ND unable to assess HSM due to body habitus. Ext: Trace LE edema.  1+ radial pulses bilaterally.. Skin: Warm and dry without rash.  EKG: Sinus bradycardia (heart rate 50 bpm) with left axis deviation.  Otherwise, no significant abnormality.  Lab Results  Component Value Date   WBC 7.7 05/14/2018   HGB 14.3 05/14/2018   HCT 42.1 05/14/2018   MCV 88.4 05/14/2018   PLT 141 (L) 05/14/2018     Lab Results  Component Value Date   NA 139 07/16/2018   K 4.2 07/16/2018   CL 106 07/16/2018   CO2 26 07/16/2018   BUN 19 07/16/2018   CREATININE 1.34 (H) 07/16/2018   GLUCOSE 94 07/16/2018   ALT 14 07/16/2018    Lab Results  Component Value Date   CHOL 146 07/16/2018   HDL 40 (L) 07/16/2018   LDLCALC 87 07/16/2018   TRIG 94 07/16/2018   CHOLHDL 3.7 07/16/2018    --------------------------------------------------------------------------------------------------  ASSESSMENT AND PLAN: Coronary artery disease without angina [name] continues to do well following PCI to the RCA in October.  He has not had any further chest pain.  We will continue with medical management of the small, diffusely diseased LAD.  [name] will need to complete at least 6 months (ideally 12 months) of apixaban and clopidogrel.  Persistent atrial fibrillation EKG today demonstrates sinus bradycardia.  [name] has not had any symptoms to suggest recurrence of atrial fibrillation.  We will continue with indefinite anticoagulation with apixaban as well as amiodarone 200 mg daily.  We will check a CMP and TSH in March/April, given the long-term amiodarone therapy.  HFpEF Other than trace lower extremity edema, [name] appears euvolemic.  He reports NYHA class II symptoms, with his primary limitation being knee pain.  Due to bradycardia and fatigue, I will stop metoprolol today.  Continue furosemide 20 mg daily.  Hyperlipidemia Goal LDL less than 70.  Most recent study in December showed LDL of 87.  This prompted addition of ezetimibe 10 mg daily to atorvastatin 80 mg daily.  We will plan for a lipid panel with LFTs in about 3 months.  If LDL remains above goal, referral to the lipid clinic will need to be considered.  Hypertension Blood pressure adequately controlled.  As above, I will decrease metoprolol due to fatigue and bradycardia.  Follow-up: Return to clinic in 6 months.  Nelva Bush,  MD 08/18/2018 8:45 AM

## 2018-08-18 NOTE — Patient Instructions (Signed)
Medication Instructions:  Your physician has recommended you make the following change in your medication:  1- STOP Metoprolol.   If you need a refill on your cardiac medications before your next appointment, please call your pharmacy.   Lab work: Your physician recommends that you return for lab work in: March 2020. (LIPID, CMET, TSH). - You will need to be fasting. - Please go to the [facility]. You will check in at the front desk to the right as you walk into the atrium. Valet Parking is offered if needed.   If you have labs (blood work) drawn today and your tests are completely normal, you will receive your results only by: Marland Kitchen MyChart Message (if you have MyChart) OR . A paper copy in the mail If you have any lab test that is abnormal or we need to change your treatment, we will call you to review the results.  Testing/Procedures: none  Follow-Up: At [facility], you and your health needs are our priority.  As part of our continuing mission to provide you with exceptional heart care, we have created designated Provider Care Teams.  These Care Teams include your primary Cardiologist (physician) and Advanced Practice Providers (APPs -  Physician Assistants and Nurse Practitioners) who all work together to provide you with the care you need, when you need it. You will need a follow up appointment in 6 months.  Please call our office 2 months in advance to schedule this appointment.  You may see Nelva Bush, MD or one of the following Advanced Practice Providers on your designated Care Team:   Murray Hodgkins, NP Christell Faith, PA-C . Marrianne Mood, PA-C

## 2018-09-06 ENCOUNTER — Other Ambulatory Visit: Payer: Self-pay | Admitting: Family Medicine

## 2018-09-16 ENCOUNTER — Other Ambulatory Visit: Payer: Self-pay | Admitting: *Deleted

## 2018-09-16 NOTE — Telephone Encounter (Signed)
Faxed refill request. Ramipril Last office visit:   08/08/2018 Last Filled:    90 capsule 3 05/16/2018  By Dr. Saunders Revel to [facility] in [place] Is it ok to send this to his mail order pharmacy or does this need to go back to Dr. Saunders Revel?

## 2018-09-17 MED ORDER — RAMIPRIL 10 MG PO CAPS: 10.0000 mg | ORAL_CAPSULE | Freq: Every day | ORAL | 3 refills | Status: DC

## 2018-09-17 NOTE — Telephone Encounter (Signed)
Okay to send, done. Thanks.

## 2018-09-23 DIAGNOSIS — Z85820 Personal history of malignant melanoma of skin: Secondary | ICD-10-CM | POA: Diagnosis not present

## 2018-09-23 DIAGNOSIS — Z08 Encounter for follow-up examination after completed treatment for malignant neoplasm: Secondary | ICD-10-CM | POA: Diagnosis not present

## 2018-09-23 DIAGNOSIS — L821 Other seborrheic keratosis: Secondary | ICD-10-CM | POA: Diagnosis not present

## 2018-09-23 DIAGNOSIS — Z85828 Personal history of other malignant neoplasm of skin: Secondary | ICD-10-CM | POA: Diagnosis not present

## 2018-10-14 ENCOUNTER — Other Ambulatory Visit: Payer: Self-pay

## 2018-10-14 ENCOUNTER — Other Ambulatory Visit
Admission: RE | Admit: 2018-10-14 | Discharge: 2018-10-14 | Disposition: A | Discharge status: Home / Self Care | Payer: Medicare Other | Source: Ambulatory Visit | Attending: Internal Medicine | Admitting: Internal Medicine

## 2018-10-14 DIAGNOSIS — Z79899 Other long term (current) drug therapy: Secondary | ICD-10-CM

## 2018-10-14 DIAGNOSIS — I5032 Chronic diastolic (congestive) heart failure: Secondary | ICD-10-CM

## 2018-10-14 DIAGNOSIS — I10 Essential (primary) hypertension: Secondary | ICD-10-CM

## 2018-10-14 DIAGNOSIS — I4819 Other persistent atrial fibrillation: Secondary | ICD-10-CM | POA: Diagnosis not present

## 2018-10-14 DIAGNOSIS — E785 Hyperlipidemia, unspecified: Secondary | ICD-10-CM

## 2018-10-14 LAB — LIPID PANEL
Cholesterol: 131 mg/dL (ref 0–200)
HDL: 48 mg/dL (ref >40)
LDL CALC: 71 mg/dL (ref 0–99)
Total CHOL/HDL Ratio: 2.7 RATIO
Triglycerides: 61 mg/dL (ref <150)
VLDL: 12 mg/dL (ref 0–40)

## 2018-10-14 LAB — COMPREHENSIVE METABOLIC PANEL
ALBUMIN: 3.7 g/dL (ref 3.5–5.0)
ALT: 12 U/L (ref 0–44)
AST: 17 U/L (ref 15–41)
Alkaline Phosphatase: 48 U/L (ref 38–126)
Anion gap: 8 (ref 5–15)
BUN: 18 mg/dL (ref 8–23)
CO2: 25 mmol/L (ref 22–32)
Calcium: 9.1 mg/dL (ref 8.9–10.3)
Chloride: 106 mmol/L (ref 98–111)
Creatinine, Ser: 1.38 mg/dL — ABNORMAL HIGH (ref 0.61–1.24)
GFR calc Af Amer: 59 mL/min — ABNORMAL LOW (ref >60)
GFR calc non Af Amer: 51 mL/min — ABNORMAL LOW (ref >60)
Glucose, Bld: 92 mg/dL (ref 70–99)
POTASSIUM: 4.6 mmol/L (ref 3.5–5.1)
SODIUM: 139 mmol/L (ref 135–145)
Total Bilirubin: 1.1 mg/dL (ref 0.3–1.2)
Total Protein: 6.2 g/dL — ABNORMAL LOW (ref 6.5–8.1)

## 2018-10-14 LAB — TSH: TSH: 9.078 u[IU]/mL — AB (ref 0.350–4.500)

## 2018-10-15 ENCOUNTER — Telehealth: Payer: Self-pay | Admitting: *Deleted

## 2018-10-15 DIAGNOSIS — I4819 Other persistent atrial fibrillation: Secondary | ICD-10-CM

## 2018-10-15 DIAGNOSIS — I5032 Chronic diastolic (congestive) heart failure: Secondary | ICD-10-CM

## 2018-10-15 NOTE — Telephone Encounter (Signed)
Called and spoke with [facility]. She will find out if the free T4 test can be added and give [place] a call back.

## 2018-10-15 NOTE — Telephone Encounter (Signed)
-----   Message from Nelva Bush, MD sent at 10/15/2018  3:54 PM EDT ----- James [name], please let James [name] know that his renal function and electrolytes are stable.  His LDL is almost at goal at 71.  I recommend that he continue his current lipid regimen.  His TSH has risen; can we add a free T4 onto this sample?  It is possible that his synthroid may need to be adjusted or that amiodarone is causing thyroid issues.  I will forward these results to James [name] PCP, Dr. Damita Dunnings, as well.

## 2018-10-16 LAB — T4, FREE: Free T4: 1.54 ng/dL (ref 0.82–1.77)

## 2018-10-16 NOTE — Telephone Encounter (Signed)
Called [facility] lab today. She said she would pull the specimen and call back if unable to add on. Order entered into Epic.

## 2018-10-23 ENCOUNTER — Telehealth: Payer: Self-pay | Admitting: *Deleted

## 2018-10-23 DIAGNOSIS — R7989 Other specified abnormal findings of blood chemistry: Secondary | ICD-10-CM

## 2018-10-23 DIAGNOSIS — Z79899 Other long term (current) drug therapy: Secondary | ICD-10-CM

## 2018-10-23 NOTE — Telephone Encounter (Signed)
Notes recorded by Nelva Bush, MD on 10/15/2018 at 3:54 PM EDT James [name], please let Mr. Spychalski know that his renal function and electrolytes are stable. His LDL is almost at goal at 71. I recommend that he continue his current lipid regimen. His TSH has risen; can we add a free T4 onto this sample? It is possible that his synthroid may need to be adjusted or that amiodarone is causing thyroid issues. I will forward these results to Mr. Schue PCP, Dr. Damita Dunnings, as well.

## 2018-10-23 NOTE — Telephone Encounter (Signed)
-----   Message from Nelva Bush, MD sent at 10/17/2018  3:23 PM EDT ----- Free T4 is normal, though TSH is elevated.  I would favor leaving his medications as they are and repeating a TSH in 3 months.  If TSH continues to climb, we may need to consider stopping amiodarone in favor of an alternate antiarrhythmic.  I will forward this to Dr. Damita Dunnings as well.

## 2018-10-23 NOTE — Telephone Encounter (Signed)
Results called to pt. Pt verbalized understanding. Scheduled him for repeat TSH lab in June. He was appreciative.

## 2018-10-23 NOTE — Telephone Encounter (Signed)
Done and results called.

## 2018-12-06 ENCOUNTER — Other Ambulatory Visit: Payer: Self-pay | Admitting: Internal Medicine

## 2019-01-02 ENCOUNTER — Telehealth: Payer: Self-pay

## 2019-01-02 NOTE — Telephone Encounter (Signed)
   TELEPHONE CALL NOTE  This patient has been deemed a candidate for follow-up tele-health visit to limit community exposure during the Covid-19 pandemic. I spoke with the patient via phone to discuss instructions. The patient was advised to review the section on consent for treatment as well. The patient will receive a phone call 2-3 days prior to their E-Visit at which time consent will be verbally confirmed. A Virtual Office Visit appointment type has been scheduled for 01/05/2019 with Darylene Price FNP.  Gaylord Shih, CMA 01/02/2019 11:46 AM

## 2019-01-02 NOTE — Telephone Encounter (Signed)
TELEPHONE CALL NOTE  James [name] has been deemed a candidate for a follow-up tele-health visit to limit community exposure during the Covid-19 pandemic. I spoke with the patient via phone to ensure availability of phone/video source, confirm preferred email & phone number, discuss instructions and expectations, and review consent.   I reminded James [name] to be prepared with any vital sign and/or heart rhythm information that could potentially be obtained via home monitoring, at the time of his visit.  Finally, I reminded James [name] to expect an e-mail containing a link for their video-based visit approximately 15 minutes before his visit, or alternatively, a phone call at the time of his visit if his visit is planned to be a phone encounter.  Did the patient verbally consent to treatment as below? YES   Gaylord Shih, CMA 01/02/2019 11:46 AM  CONSENT FOR TELE-HEALTH VISIT - PLEASE REVIEW  I hereby voluntarily request, consent and authorize [facility] and its employed or contracted physicians, physician assistants, nurse practitioners or other licensed health care professionals (the Practitioner), to provide me with telemedicine health care services (the "Services") as deemed necessary by the treating Practitioner. I acknowledge and consent to receive the Services by the Practitioner via telemedicine. I understand that the telemedicine visit will involve communicating with the Practitioner through telephonic communication technology and the disclosure of certain medical information by electronic transmission. I acknowledge that I have been given the opportunity to request an in-person assessment or other available alternative prior to the telemedicine visit and am voluntarily participating in the telemedicine visit.  I understand that I have the right to withhold or withdraw my consent to the use of telemedicine in the course of my care at any time, without affecting my  right to future care or treatment, and that the Practitioner or I may terminate the telemedicine visit at any time. I understand that I have the right to inspect all information obtained and/or recorded in the course of the telemedicine visit and may receive copies of available information for a reasonable fee.  I understand that some of the potential risks of receiving the Services via telemedicine include:  Marland Kitchen Delay or interruption in medical evaluation due to technological equipment failure or disruption; . Information transmitted may not be sufficient (e.g. poor resolution of images) to allow for appropriate medical decision making by the Practitioner; and/or  . In rare instances, security protocols could fail, causing a breach of personal health information.  Furthermore, I acknowledge that it is my responsibility to provide information about my medical history, conditions and care that is complete and accurate to the best of my ability. I acknowledge that Practitioner's advice, recommendations, and/or decision may be based on factors not within their control, such as incomplete or inaccurate data provided by me or lack of visual representation. I understand that the practice of medicine is not an exact science and that Practitioner makes no warranties or guarantees regarding treatment outcomes. I acknowledge that I will receive a copy of this consent concurrently upon execution via email to the email address I last provided but may also request a printed copy by calling the office of [facility].    I understand that my insurance may be billed for this visit.   I have read or had this consent read to me. . I understand the contents of this consent, which adequately explains the benefits and risks of the Services being provided via telemedicine.  Marland Kitchen  I have been provided ample opportunity to ask questions regarding this consent and the Services and have had my questions answered to my  satisfaction. . I give my informed consent for the services to be provided through the use of telemedicine in my medical care  By participating in this telemedicine visit I agree to the above.

## 2019-01-05 ENCOUNTER — Encounter: Payer: Self-pay | Admitting: Family

## 2019-01-05 ENCOUNTER — Other Ambulatory Visit: Payer: Self-pay

## 2019-01-05 ENCOUNTER — Ambulatory Visit: Payer: Medicare Other | Attending: Family | Admitting: Family

## 2019-01-05 VITALS — BP 135/74 | Wt 259.5 lb

## 2019-01-05 DIAGNOSIS — I5032 Chronic diastolic (congestive) heart failure: Secondary | ICD-10-CM

## 2019-01-05 DIAGNOSIS — I10 Essential (primary) hypertension: Secondary | ICD-10-CM

## 2019-01-05 DIAGNOSIS — I4819 Other persistent atrial fibrillation: Secondary | ICD-10-CM

## 2019-01-05 NOTE — Progress Notes (Signed)
Virtual Visit via Telephone Note    Evaluation Performed:  Follow-up visit  This visit type was conducted due to national recommendations for restrictions regarding the COVID-19 Pandemic (e.g. social distancing).  This format is felt to be most appropriate for this patient at this time.  All issues noted in this document were discussed and addressed.  No physical exam was performed (except for noted visual exam findings with Video Visits).  Please refer to the patient's chart (MyChart message for video visits and phone note for telephone visits) for the patient's consent to telehealth for [facility]  Date:  01/05/2019   ID:  James [name], DOB [birth year], MRN [id number]   Patient Location:  [place] [address]   Provider location:   [facility] [place] [address], [place] [address]  PCP:  Tonia Ghent, MD  Cardiologist:  Nelva Bush, MD  Electrophysiologist:  None   Chief Complaint:  fatigue  History of Present Illness:    James [name] is a 73 y.o. male who presents via audio/video conferencing for a telehealth visit today.  Patient verified DOB and address.  The patient does not have symptoms concerning for COVID-19 infection (fever, chills, cough, or new SHORTNESS OF BREATH).   Patient reports minimal fatigue upon moderate exertion. He describes this as chronic in nature having been present for several years. He doesn't have any other complaints and specifically denies dizziness, swelling in his legs/ abdomen, palpitations, chest pain, shortness of breath, cough, difficulty sleeping or weight gain.   Prior CV studies:   The following studies were reviewed today:  Echo report from 05/09/18 reviewed and showed an EF of 55-60% along with trivial AR and mila MR.    Past Medical History:  Diagnosis Date  . Acute lower GI bleeding after colonoscopy and polypectomy, resolved 12/12/2015  . Cardiac murmur    a. 02/2018  Echo: EF 60-65%, no rwma, mild AI, mildly dil Ao root/Asc Ao/Arch, Mild MR, mildly dil LA. Nl RV fxn.  . Cataract    bilateral surgery to remove  . CHF (congestive heart failure) ([place])   . Colon polyp   . Constipation   . Dilated aortic root ([place])    a. 02/2018 Echo: mildly dil Ao root/asc ao/arch.  . Essential hypertension   . Hemorrhoids   . Hepatitis    self resolved, likely food exposure, 1974.   Marland Kitchen History of cardiovascular stress test    a. 02/2018 Myoview: EF 55-65%, small/mild apical defect w/ nl wall motion ->attenuation artifact. No ischemia. Low risk study.  Marland Kitchen Hx of adenomatous colonic polyps 12/05/2015  . Hyperlipidemia   . Hypothyroidism   . Obstructive sleep apnea 05/23/2018  . Osteoarthritis   . Persistent atrial fibrillation    a. Dx 02/2018. CHA2DS2VASc = 2-->Eliquis; b. 03/2018 DCCV (200J); c. 03/2018 recurrent Afib-->flecainide started 04/2018;  d. 05/06/2018 s/p successful DCCV - 200J x 2.  . Skin cancer    basal cell, R ear, MOHS   Past Surgical History:  Procedure Laterality Date  . BROW LIFT Bilateral 10/23/2016   Procedure: BLEPHAROPLASTY upper eyelid with excess skin;  Surgeon: Karle Starch, MD;  Location: [facility];  Service: Ophthalmology;  Laterality: Bilateral;  MAC  . CARDIOVERSION N/A 03/18/2018   Procedure: CARDIOVERSION;  Surgeon: Nelva Bush, MD;  Location: [facility];  Service: Cardiovascular;  Laterality: N/A;  . CARDIOVERSION N/A 05/06/2018   Procedure: CARDIOVERSION;  Surgeon: Nelva Bush,  MD;  Location: [facility];  Service: Cardiovascular;  Laterality: N/A;  . CATARACT EXTRACTION  1986   OD  . CATARACT EXTRACTION Bilateral 1995   x 2 for right and left   . COLONOSCOPY    . CORONARY STENT INTERVENTION N/A 05/13/2018   Procedure: CORONARY STENT INTERVENTION;  Surgeon: Nelva Bush, MD;  Location: [facility];  Service: Cardiovascular;  Laterality: N/A;  . ECTROPION REPAIR Bilateral 10/23/2016   Procedure: REPAIR OF ECTROPION  sutures, extensive;  Surgeon: Karle Starch, MD;  Location: [facility];  Service: Ophthalmology;  Laterality: Bilateral;  . LIPOMA EXCISION  06/1997   left neck ([place])  . MOHS SURGERY Right 2013   behind right ear  . RIGHT/LEFT HEART CATH AND CORONARY ANGIOGRAPHY N/A 05/12/2018   Procedure: RIGHT/LEFT HEART CATH AND CORONARY ANGIOGRAPHY;  Surgeon: Wellington Hampshire, MD;  Location: [facility];  Service: Cardiovascular;  Laterality: N/A;  . TONSILLECTOMY    . VASECTOMY  1982     Current Meds  Medication Sig  . amiodarone (PACERONE) 200 MG tablet TAKE 1 TABLET BY MOUTH ONCE DAILY  . apixaban (ELIQUIS) 5 MG TABS tablet Take 1 tablet (5 mg total) by mouth 2 (two) times daily.  Marland Kitchen atorvastatin (LIPITOR) 80 MG tablet Take 80 mg by mouth daily.  . clopidogrel (PLAVIX) 75 MG tablet Take 1 tablet (75 mg total) by mouth daily.  . DHA-EPA-Coenzyme Q10-Vitamin E (CO Q-10 VITAMIN E FISH OIL PO) Take 1 capsule by mouth daily.  Marland Kitchen doxazosin (CARDURA) 4 MG tablet Take 1 tablet (4 mg total) by mouth daily after breakfast.  . ezetimibe (ZETIA) 10 MG tablet Take 10 mg by mouth daily.  . finasteride (PROSCAR) 5 MG tablet Take 1 tablet (5 mg total) by mouth daily.  . furosemide (LASIX) 20 MG tablet Take 1 tablet (20 mg total) by mouth daily.  Marland Kitchen levothyroxine (SYNTHROID, LEVOTHROID) 150 MCG tablet Take 1 tablet (150 mcg total) by mouth See admin instructions. Take 1 tablet a day except for 1/2 tablet on Sundays.  Total of 6.5 tablets/week.  . Multiple Vitamin (MULTIVITAMIN WITH MINERALS) TABS tablet Take 1 tablet by mouth daily. Senior Multivitamin  . psyllium (METAMUCIL) 58.6 % powder Take 1 packet by mouth daily.   . ramipril (ALTACE) 10 MG capsule Take 1 capsule (10 mg total) by mouth daily.     Allergies:   Sulfonamide derivatives   Social History   Tobacco Use  . Smoking status: Former Smoker    Packs/day: 2.00    Years: 20.00    Pack years: 40.00    Types: Cigarettes    Last  attempt to quit: 08/07/1983    Years since quitting: 35.4  . Smokeless tobacco: Never Used  . Tobacco comment: quit over 30 years ago  Substance Use Topics  . Alcohol use: Yes    Alcohol/week: 1.0 standard drinks    Types: 1 Glasses of wine per week    Comment: per day  . Drug use: No     Family Hx: The patient's family history includes Heart disease in his paternal grandfather; Lung cancer in his maternal grandfather; Stroke in his maternal grandfather and mother. There is no history of Colon cancer, Colon polyps, Esophageal cancer, Rectal cancer, Stomach cancer, Bladder Cancer, or Prostate cancer.  ROS:   Please see the history of present illness.     All other systems reviewed and are negative.   Labs/Other Tests and Data Reviewed:  Recent Labs: 05/09/2018: B Natriuretic Peptide 290.0 05/14/2018: Hemoglobin 14.3; Platelets 141 10/14/2018: ALT 12; BUN 18; Creatinine, Ser 1.38; Potassium 4.6; Sodium 139; TSH 9.078   Recent Lipid Panel Lab Results  Component Value Date/Time   CHOL 131 10/14/2018 08:53 AM   TRIG 61 10/14/2018 08:53 AM   HDL 48 10/14/2018 08:53 AM   CHOLHDL 2.7 10/14/2018 08:53 AM   LDLCALC 71 10/14/2018 08:53 AM    Wt Readings from Last 3 Encounters:  01/05/19 259 lb 8 oz (117.7 kg)  08/18/18 268 lb (121.6 kg)  08/08/18 266 lb (120.7 kg)     Exam:    Vital Signs:  BP 135/74 Comment: self-reported  Wt 259 lb 8 oz (117.7 kg) Comment: self-reported  BMI 34.24 kg/m    Well nourished, well developed male in no  acute distress.   ASSESSMENT & PLAN:    1.Chronic heart failure with preserved ejection fraction- - NYHA class II - euvolemic today based on patient's description of symptoms - weighing daily and he was reminded to call for an overnight weight gain of >2 pounds or a weekly weight gain of >5 pounds; home weight chart was reviewed - not adding salt and is trying to closely read food labels for sodium content - saw cardiology (End) 08/18/2018 -  BNP 05/09/18 was 290.0 - hasn't been volunteering due to COVID-19  2: Atrial fibrillation- - had cardioversion 05/06/18  3: HTN- - self-reported BP looks good today (135/74) - saw PCP Damita Dunnings) 08/08/2018 - BMP 10/14/2018 reviewed and showed sodium 139, potassium 4.6, creatinine 1.38 and GFR 51    COVID-19 Education: The signs and symptoms of COVID-19 were discussed with the patient and how to seek care for testing (follow up with PCP or arrange E-visit).  The importance of social distancing was discussed today.  Patient Risk:   After full review of this patients clinical status, I feel that they are at least moderate risk at this time.  Time:   Today, I have spent 8 minutes with the patient with telehealth technology discussing medications, diet and symptoms to report.     Medication Adjustments/Labs and Tests Ordered: Current medicines are reviewed at length with the patient today.  Concerns regarding medicines are outlined above.   Tests Ordered: No orders of the defined types were placed in this encounter.  Medication Changes: No orders of the defined types were placed in this encounter.   Disposition:  Follow-up in 4 months or sooner for any questions/problems before then.   Signed, Alisa Graff, FNP  01/05/2019 8:43 AM    [facility]

## 2019-01-05 NOTE — Patient Instructions (Signed)
Continue weighing daily and call for an overnight weight gain of > 2 pounds or a weekly weight gain of >5 pounds. 

## 2019-01-06 ENCOUNTER — Telehealth: Payer: Self-pay | Admitting: Internal Medicine

## 2019-01-06 NOTE — Telephone Encounter (Signed)
Virtual Visit Pre-Appointment Phone Call  "(Name), I am calling you today to discuss your upcoming appointment. We are currently trying to limit exposure to the virus that causes COVID-19 by seeing patients at home rather than in the office."  1. "What is the BEST phone number to call the day of the visit?" - include this in appointment notes  2. "Do you have or have access to (through a family member/friend) a smartphone with video capability that we can use for your visit?" a. If yes - list this number in appt notes as "cell" (if different from BEST phone #) and list the appointment type as a VIDEO visit in appointment notes b. If no - list the appointment type as a PHONE visit in appointment notes  Confirm consent - "In the setting of the current Covid19 crisis, you are scheduled for a (phone or video) visit with your provider on (date) at (time).  Just as we do with many in-office visits, in order for you to participate in this visit, we must obtain consent.  If you'd like, I can send this to your mychart (if signed up) or email for you to review.  Otherwise, I can obtain your verbal consent now.  All virtual visits are billed to your insurance company just like a normal visit would be.  By agreeing to a virtual visit, we'd like you to understand that the technology does not allow for your provider to perform an examination, and thus may limit your provider's ability to fully assess your condition. If your provider identifies any concerns that need to be evaluated in person, we will make arrangements to do so.  Finally, though the technology is pretty good, we cannot assure that it will always work on either your or our end, and in the setting of a video visit, we may have to convert it to a phone-only visit.  In either situation, we cannot ensure that we have a secure connection.  Are you willing to proceed?" STAFF: Did the patient verbally acknowledge consent to telehealth visit? Document  YES/NO here:yes 3. Advise patient to be prepared - "Two hours prior to your appointment, go ahead and check your blood pressure, pulse, oxygen saturation, and your weight (if you have the equipment to check those) and write them all down. When your visit starts, your provider will ask you for this information. If you have an Apple Watch or Kardia device, please plan to have heart rate information ready on the day of your appointment. Please have a pen and paper handy nearby the day of the visit as well."  4. Give patient instructions for MyChart download to smartphone OR Doximity/[email or web address].me as below if video visit (depending on what platform provider is using)  5. Inform patient they will receive a phone call 15 minutes prior to their appointment time (may be from unknown caller ID) so they should be prepared to answer    TELEPHONE CALL NOTE  LOOMIS ANACKER has been deemed a candidate for a follow-up tele-health visit to limit community exposure during the Covid-19 pandemic. I spoke with the patient via phone to ensure availability of phone/video source, confirm preferred email & phone number, and discuss instructions and expectations.  I reminded [name] to be prepared with any vital sign and/or heart rhythm information that could potentially be obtained via home monitoring, at the time of his visit. I reminded ROC STREETT to expect a phone call prior to his visit.  [name] 01/06/2019 11:50 AM   INSTRUCTIONS FOR DOWNLOADING THE MYCHART APP TO SMARTPHONE  - The patient must first make sure to have activated MyChart and know their login information - If Apple, go to [employer] and type in MyChart in the search bar and download the app. If Android, ask patient to go to [employer] and type in [place] in the search bar and download the app. The app is free but as with any other app downloads, their phone may require them to verify saved payment information or Apple/Android  password.  - The patient will need to then log into the app with their MyChart username and password, and select [facility] as their healthcare provider to link the account. When it is time for your visit, go to the MyChart app, find appointments, and click Begin Video Visit. Be sure to Select Allow for your device to access the Microphone and Camera for your visit. You will then be connected, and your provider will be with you shortly.  **If they have any issues connecting, or need assistance please contact MyChart service desk [phone])**  **If using a computer, in order to ensure the best quality for their visit they will need to use either of the following Internet Browsers: [employer], or Google Chrome**  IF USING DOXIMITY or DOXY.ME - The patient will receive a link just prior to their visit by text.     FULL LENGTH CONSENT FOR TELE-HEALTH VISIT   I hereby voluntarily request, consent and authorize [facility] and its employed or contracted physicians, physician assistants, nurse practitioners or other licensed health care professionals (the Practitioner), to provide me with telemedicine health care services (the "Services") as deemed necessary by the treating Practitioner. I acknowledge and consent to receive the Services by the Practitioner via telemedicine. I understand that the telemedicine visit will involve communicating with the Practitioner through live audiovisual communication technology and the disclosure of certain medical information by electronic transmission. I acknowledge that I have been given the opportunity to request an in-person assessment or other available alternative prior to the telemedicine visit and am voluntarily participating in the telemedicine visit.  I understand that I have the right to withhold or withdraw my consent to the use of telemedicine in the course of my care at any time, without affecting my right to future care or treatment,  and that the Practitioner or I may terminate the telemedicine visit at any time. I understand that I have the right to inspect all information obtained and/or recorded in the course of the telemedicine visit and may receive copies of available information for a reasonable fee.  I understand that some of the potential risks of receiving the Services via telemedicine include:  Marland Kitchen Delay or interruption in medical evaluation due to technological equipment failure or disruption; . Information transmitted may not be sufficient (e.g. poor resolution of images) to allow for appropriate medical decision making by the Practitioner; and/or  . In rare instances, security protocols could fail, causing a breach of personal health information.  Furthermore, I acknowledge that it is my responsibility to provide information about my medical history, conditions and care that is complete and accurate to the best of my ability. I acknowledge that Practitioner's advice, recommendations, and/or decision may be based on factors not within their control, such as incomplete or inaccurate data provided by me or distortions of diagnostic images or specimens that may result from electronic transmissions. I understand that the practice  of medicine is not an Chief Strategy Officer and that Practitioner makes no warranties or guarantees regarding treatment outcomes. I acknowledge that I will receive a copy of this consent concurrently upon execution via email to the email address I last provided but may also request a printed copy by calling the office of [facility].    I understand that my insurance will be billed for this visit.   I have read or had this consent read to me. . I understand the contents of this consent, which adequately explains the benefits and risks of the Services being provided via telemedicine.  . I have been provided ample opportunity to ask questions regarding this consent and the Services and have had my questions  answered to my satisfaction. . I give my informed consent for the services to be provided through the use of telemedicine in my medical care  By participating in this telemedicine visit I agree to the above.

## 2019-01-20 ENCOUNTER — Other Ambulatory Visit
Admission: RE | Admit: 2019-01-20 | Discharge: 2019-01-20 | Disposition: A | Discharge status: Home / Self Care | Payer: Medicare Other | Source: Ambulatory Visit | Attending: Internal Medicine | Admitting: Internal Medicine

## 2019-01-20 DIAGNOSIS — R7989 Other specified abnormal findings of blood chemistry: Secondary | ICD-10-CM | POA: Diagnosis not present

## 2019-01-20 DIAGNOSIS — Z79899 Other long term (current) drug therapy: Secondary | ICD-10-CM | POA: Diagnosis not present

## 2019-01-20 LAB — TSH: TSH: 4.211 u[IU]/mL (ref 0.350–4.500)

## 2019-01-21 ENCOUNTER — Other Ambulatory Visit: Payer: Self-pay | Admitting: Urology

## 2019-01-21 MED ORDER — FINASTERIDE 5 MG PO TABS: 5.0000 mg | ORAL_TABLET | Freq: Every day | ORAL | 0 refills | Status: DC

## 2019-01-21 NOTE — Telephone Encounter (Signed)
Pt called and needs rx refill for Finasteride. Please advise

## 2019-01-21 NOTE — Telephone Encounter (Signed)
Refill sent my chart notification sent

## 2019-01-22 ENCOUNTER — Other Ambulatory Visit: Payer: Medicare Other

## 2019-01-26 ENCOUNTER — Other Ambulatory Visit: Payer: Self-pay | Admitting: *Deleted

## 2019-01-26 MED ORDER — FINASTERIDE 5 MG PO TABS: 5.0000 mg | ORAL_TABLET | Freq: Every day | ORAL | 0 refills | Status: DC

## 2019-01-29 DIAGNOSIS — Z0120 Encounter for dental examination and cleaning without abnormal findings: Secondary | ICD-10-CM | POA: Diagnosis not present

## 2019-02-05 ENCOUNTER — Telehealth: Payer: Self-pay | Admitting: *Deleted

## 2019-02-05 NOTE — Telephone Encounter (Signed)

## 2019-02-09 ENCOUNTER — Ambulatory Visit: Payer: Medicare Other | Admitting: Internal Medicine

## 2019-02-09 ENCOUNTER — Other Ambulatory Visit: Payer: Self-pay

## 2019-02-09 ENCOUNTER — Encounter: Payer: Self-pay | Admitting: Internal Medicine

## 2019-02-09 VITALS — BP 120/76 | HR 76 | Ht 73.0 in | Wt 263.8 lb

## 2019-02-09 DIAGNOSIS — Z01810 Encounter for preprocedural cardiovascular examination: Secondary | ICD-10-CM

## 2019-02-09 DIAGNOSIS — I2510 Atherosclerotic heart disease of native coronary artery without angina pectoris: Secondary | ICD-10-CM | POA: Diagnosis not present

## 2019-02-09 DIAGNOSIS — I5032 Chronic diastolic (congestive) heart failure: Secondary | ICD-10-CM | POA: Diagnosis not present

## 2019-02-09 DIAGNOSIS — Z79899 Other long term (current) drug therapy: Secondary | ICD-10-CM

## 2019-02-09 DIAGNOSIS — I10 Essential (primary) hypertension: Secondary | ICD-10-CM

## 2019-02-09 DIAGNOSIS — I4819 Other persistent atrial fibrillation: Secondary | ICD-10-CM | POA: Diagnosis not present

## 2019-02-09 DIAGNOSIS — E785 Hyperlipidemia, unspecified: Secondary | ICD-10-CM

## 2019-02-09 NOTE — Patient Instructions (Addendum)
Medication Instructions:  Your physician recommends that you continue on your current medications as directed. Please refer to the Current Medication list given to you today.  If you need a refill on your cardiac medications before your next appointment, please call your pharmacy.   Lab work: Your physician recommends that you return for lab work in: 3 months prior to appointment with Dr End.  You should be able to call our office a month before and schedule a time and date to come to our office for the lab work.  (cmp, tsh) - The same day you can stop by the [facility] coming or going for the chest x-ray.  If you have labs (blood work) drawn today and your tests are completely normal, you will receive your results only by: Marland Kitchen MyChart Message (if you have MyChart) OR . A paper copy in the mail If you have any lab test that is abnormal or we need to change your treatment, we will call you to review the results.  Testing/Procedures: In 3 MONTHS PRIOR TO APPOINTMENT- A chest x-ray takes a picture of the organs and structures inside the chest, including the heart, lungs, and blood vessels. This test can show several things, including, whether the heart is enlarges; whether fluid is building up in the lungs; and whether pacemaker / defibrillator leads are still in place.  - Please go to the [facility]. You will check in at the front desk to the right as you walk into the atrium. Valet Parking is offered if needed.    Follow-Up: At [facility], you and your health needs are our priority.  As part of our continuing mission to provide you with exceptional heart care, we have created designated Provider Care Teams.  These Care Teams include your primary Cardiologist (physician) and Advanced Practice Providers (APPs -  Physician Assistants and Nurse Practitioners) who all work together to provide you with the care you need, when you need it. You will need a follow up appointment in 3 months.   Please call our office 2 months in advance to schedule this appointment.  You may see Nelva Bush, MD or one of the following Advanced Practice Providers on your designated Care Team:   Murray Hodgkins, NP Christell Faith, PA-C . Marrianne Mood, PA-C

## 2019-02-09 NOTE — Progress Notes (Signed)
Follow-up Outpatient Visit Date: 02/09/2019  Primary Care Provider: Tonia Ghent, MD [place] [address]  Chief Complaint: Follow-up CAD and PAF.  HPI:  James [name] is a 73 y.o. year-old male with history of artery disease status [name] PCI to the RCA (05/2018), HFpEF, persistent atrial fibrillation, hypertension, hyperlipidemia, hypothyroidism, and obesity, who presents for follow-up of coronary artery disease, HFpEF, and atrial fibrillation.  I last saw him in January, at which time he was doing relatively well, though he noted some lack of energy.  We therefore agreed to stop metoprolol.  Today, James [name] reports that he still has some low energy.  He believes that this is largely due to inactivity related to chronic left knee pain.  He had previously considered a total knee replacement, though this was put on hold by his cardiac issues over the last year.  He is now interested in proceeding with knee surgery again.  From a heart standpoint, he has done well without chest pain, shortness of breath, palpitations, or lightheadedness.  He bruises easily but has not had any significant bleeding.  He is tolerating his current medications without side effects.  James [name] is able to walk several hundred yards without chest pain or shortness of breath, including up a slight incline.  --------------------------------------------------------------------------------------------------  Past Medical History:  Diagnosis Date  . Acute lower GI bleeding after colonoscopy and polypectomy, resolved 12/12/2015  . Cardiac murmur    a. 02/2018 Echo: EF 60-65%, no rwma, mild AI, mildly dil Ao root/Asc Ao/Arch, Mild MR, mildly dil LA. Nl RV fxn.  . Cataract    bilateral surgery to remove  . CHF (congestive heart failure) ([place])   . Colon polyp   . Constipation   . Dilated aortic root ([place])    a. 02/2018 Echo: mildly dil Ao root/asc ao/arch.  . Essential hypertension   . Hemorrhoids    . Hepatitis    self resolved, likely food exposure, 1974.   Marland Kitchen History of cardiovascular stress test    a. 02/2018 Myoview: EF 55-65%, small/mild apical defect w/ nl wall motion ->attenuation artifact. No ischemia. Low risk study.  Marland Kitchen Hx of adenomatous colonic polyps 12/05/2015  . Hyperlipidemia   . Hypothyroidism   . Obstructive sleep apnea 05/23/2018  . Osteoarthritis   . Persistent atrial fibrillation    a. Dx 02/2018. CHA2DS2VASc = 2-->Eliquis; b. 03/2018 DCCV (200J); c. 03/2018 recurrent Afib-->flecainide started 04/2018;  d. 05/06/2018 s/p successful DCCV - 200J x 2.  . Skin cancer    basal cell, R ear, MOHS   Past Surgical History:  Procedure Laterality Date  . BROW LIFT Bilateral 10/23/2016   Procedure: BLEPHAROPLASTY upper eyelid with excess skin;  Surgeon: Karle Starch, MD;  Location: [facility];  Service: Ophthalmology;  Laterality: Bilateral;  MAC  . CARDIOVERSION N/A 03/18/2018   Procedure: CARDIOVERSION;  Surgeon: Nelva Bush, MD;  Location: [facility];  Service: Cardiovascular;  Laterality: N/A;  . CARDIOVERSION N/A 05/06/2018   Procedure: CARDIOVERSION;  Surgeon: Nelva Bush, MD;  Location: [facility];  Service: Cardiovascular;  Laterality: N/A;  . CATARACT EXTRACTION  1986   OD  . CATARACT EXTRACTION Bilateral 1995   x 2 for right and left   . COLONOSCOPY    . CORONARY STENT INTERVENTION N/A 05/13/2018   Procedure: CORONARY STENT INTERVENTION;  Surgeon: Nelva Bush, MD;  Location: [facility];  Service: Cardiovascular;  Laterality: N/A;  . ECTROPION REPAIR Bilateral 10/23/2016  Procedure: REPAIR OF ECTROPION sutures, extensive;  Surgeon: Karle Starch, MD;  Location: [facility];  Service: Ophthalmology;  Laterality: Bilateral;  . LIPOMA EXCISION  06/1997   left neck (Juengel)  . MOHS SURGERY Right 2013   behind right ear  . RIGHT/LEFT HEART CATH AND CORONARY ANGIOGRAPHY N/A 05/12/2018   Procedure: RIGHT/LEFT HEART CATH AND CORONARY  ANGIOGRAPHY;  Surgeon: Wellington Hampshire, MD;  Location: [facility];  Service: Cardiovascular;  Laterality: N/A;  . TONSILLECTOMY    . VASECTOMY  1982    Current Meds  Medication Sig  . amiodarone (PACERONE) 200 MG tablet TAKE 1 TABLET BY MOUTH ONCE DAILY  . apixaban (ELIQUIS) 5 MG TABS tablet Take 1 tablet (5 mg total) by mouth 2 (two) times daily.  Marland Kitchen atorvastatin (LIPITOR) 80 MG tablet Take 80 mg by mouth daily.  . clopidogrel (PLAVIX) 75 MG tablet Take 1 tablet (75 mg total) by mouth daily.  . DHA-EPA-Coenzyme Q10-Vitamin E (CO Q-10 VITAMIN E FISH OIL PO) Take 1 capsule by mouth daily.  . diclofenac sodium (VOLTAREN) 1 % GEL Apply 4 g topically 4 (four) times daily as needed. (Patient taking differently: Apply 4 g topically 4 (four) times daily as needed (knee pain.). )  . doxazosin (CARDURA) 4 MG tablet Take 1 tablet (4 mg total) by mouth daily after breakfast.  . ezetimibe (ZETIA) 10 MG tablet Take 10 mg by mouth daily.  . finasteride (PROSCAR) 5 MG tablet Take 1 tablet (5 mg total) by mouth daily.  . furosemide (LASIX) 20 MG tablet Take 1 tablet (20 mg total) by mouth daily.  Marland Kitchen levothyroxine (SYNTHROID, LEVOTHROID) 150 MCG tablet Take 1 tablet (150 mcg total) by mouth See admin instructions. Take 1 tablet a day except for 1/2 tablet on Sundays.  Total of 6.5 tablets/week.  . Multiple Vitamin (MULTIVITAMIN WITH MINERALS) TABS tablet Take 1 tablet by mouth daily. Senior Multivitamin  . psyllium (METAMUCIL) 58.6 % powder Take 1 packet by mouth daily.   . ramipril (ALTACE) 10 MG capsule Take 1 capsule (10 mg total) by mouth daily.    Allergies: Sulfonamide derivatives  Social History   Tobacco Use  . Smoking status: Former Smoker    Packs/day: 2.00    Years: 20.00    Pack years: 40.00    Types: Cigarettes    Quit date: 08/07/1983    Years since quitting: 35.5  . Smokeless tobacco: Never Used  . Tobacco comment: quit over 30 years ago  Substance Use Topics  . Alcohol  use: Yes    Alcohol/week: 1.0 standard drinks    Types: 1 Glasses of wine per week    Comment: per day  . Drug use: No    Family History  Problem Relation Age of Onset  . Heart disease Paternal Grandfather        MI, old age  . Stroke Mother   . Lung cancer Maternal Grandfather        smoker  . Stroke Maternal Grandfather   . Colon cancer Neg Hx   . Colon polyps Neg Hx   . Esophageal cancer Neg Hx   . Rectal cancer Neg Hx   . Stomach cancer Neg Hx   . Bladder Cancer Neg Hx   . Prostate cancer Neg Hx     Review of Systems: A 12-system review of systems was performed and was negative except as noted in the HPI.  --------------------------------------------------------------------------------------------------  Physical Exam: BP 120/76 (BP Location: Left  Arm, Patient Position: Sitting, Cuff Size: Normal)   Pulse 76   Ht _0  (1.854 m)   Wt 263 lb 12 oz (119.6 kg)   BMI 34.80 kg/m   General: NAD. HEENT: No conjunctival pallor or scleral icterus.  Face mask in place Neck: Supple without lymphadenopathy, thyromegaly, JVD, or HJR. No carotid bruit. Lungs: Normal work of breathing. Clear to auscultation bilaterally without wheezes or crackles. Heart: Regular rate and rhythm without murmurs, rubs, or gallops. Non-displaced PMI. Abd: Bowel sounds present. Soft, NT/ND without hepatosplenomegaly Ext: No lower extremity edema. Radial, PT, and DP pulses are 2+ bilaterally. Skin: Warm and dry without rash.  EKG: Normal sinus rhythm with LAFB.  Lab Results  Component Value Date   WBC 7.7 05/14/2018   HGB 14.3 05/14/2018   HCT 42.1 05/14/2018   MCV 88.4 05/14/2018   PLT 141 (L) 05/14/2018    Lab Results  Component Value Date   NA 139 10/14/2018   K 4.6 10/14/2018   CL 106 10/14/2018   CO2 25 10/14/2018   BUN 18 10/14/2018   CREATININE 1.38 (H) 10/14/2018   GLUCOSE 92 10/14/2018   ALT 12 10/14/2018    Lab Results  Component Value Date   CHOL 131 10/14/2018   HDL  48 10/14/2018   LDLCALC 71 10/14/2018   TRIG 61 10/14/2018   CHOLHDL 2.7 10/14/2018    --------------------------------------------------------------------------------------------------  ASSESSMENT AND PLAN: Coronary artery disease: No chest pain or shortness of breath to suggest worsening coronary insufficiency status [name] PCI to the RCA last October.  We will plan to complete 12 months of antiplatelet/antithrombotic therapy with clopidogrel and apixaban, though if knee surgery needs to be done before October, I think it would be reasonable to temporarily suspend this therapy in favor of aspirin monotherapy in the perioperative period.  We will continue medical management of severe, diffuse distal LAD disease.  Chronic HFpEF: James [name] appears euvolemic with stable NYHA class II heart failure symptoms.  He can continue taking furosemide 20 mg daily.  Persistent atrial fibrillation: EKG today demonstrates sinus rhythm.  James [name] has not had any symptoms to suggest recurrent atrial fibrillation.  We will plan to continue amiodarone and apixaban.  Most recent labs showed normalization of TSH as well as normal liver function.  We will plan to repeat LFTs and TSH shortly before follow-up in October.  I will also check a chest radiograph at that time even plan for long-term amiodarone use.  Hypertension: Blood pressure well controlled.  Continue doxazosin and ramipril.  Hyperlipidemia: LDL borderline at 71 on last check this spring.  I have encouraged lifestyle modifications.  We will continue with atorvastatin 80 mg daily and ezetimibe 10 mg daily.  Perioperative risk assessment: Though functional capacity is somewhat limited by knee pain, Mr. Zwiefelhofer reports being able to walk several 100 yards, consistent with at least 4 METS of activity without chest pain or significant dyspnea.  I think it is reasonable for him to proceed with elective knee replacement without additional testing or  intervention.  I would recommend discontinuing clopidogrel 7 days prior to surgery and initiation of aspirin 81 mg daily at that time.  Apixaban would then also need to be held 2 days prior to surgery.  Reinitiation of apixaban and clopidogrel (and discontinuation of aspirin) should be done as soon as it is felt safe to do so after surgery.  Follow-up: Return to clinic in 3 months.  Nelva Bush, MD 02/10/2019 6:21 AM

## 2019-02-11 DIAGNOSIS — H26491 Other secondary cataract, right eye: Secondary | ICD-10-CM | POA: Diagnosis not present

## 2019-02-24 ENCOUNTER — Other Ambulatory Visit: Payer: Self-pay | Admitting: Family Medicine

## 2019-02-24 ENCOUNTER — Other Ambulatory Visit: Payer: Self-pay | Admitting: Internal Medicine

## 2019-02-25 ENCOUNTER — Other Ambulatory Visit: Payer: Self-pay | Admitting: Family Medicine

## 2019-02-25 MED ORDER — FINASTERIDE 5 MG PO TABS: 5.0000 mg | ORAL_TABLET | Freq: Every day | ORAL | 1 refills | Status: DC

## 2019-02-26 ENCOUNTER — Telehealth: Payer: Self-pay | Admitting: Internal Medicine

## 2019-02-26 ENCOUNTER — Other Ambulatory Visit: Payer: Self-pay | Admitting: *Deleted

## 2019-02-26 MED ORDER — CLOPIDOGREL BISULFATE 75 MG PO TABS: 75.0000 mg | ORAL_TABLET | Freq: Every day | ORAL | 2 refills | Status: DC

## 2019-02-26 MED ORDER — FUROSEMIDE 20 MG PO TABS: 20.0000 mg | ORAL_TABLET | Freq: Every day | ORAL | 2 refills | Status: DC

## 2019-02-26 NOTE — Telephone Encounter (Signed)
Requested Prescriptions   Signed Prescriptions Disp Refills  . clopidogrel (PLAVIX) 75 MG tablet 90 tablet 2    Sig: Take 1 tablet (75 mg total) by mouth daily.    Authorizing Provider: END, CHRISTOPHER    Ordering User: Ersa Delaney C  . furosemide (LASIX) 20 MG tablet 90 tablet 2    Sig: Take 1 tablet (20 mg total) by mouth daily.    Authorizing Provider: END, CHRISTOPHER    Ordering User: Britt Bottom

## 2019-02-26 NOTE — Telephone Encounter (Signed)
Requested Prescriptions   Signed Prescriptions Disp Refills  . clopidogrel (PLAVIX) 75 MG tablet 90 tablet 2    Sig: Take 1 tablet (75 mg total) by mouth daily.    Authorizing Provider: END, CHRISTOPHER    Ordering User: LOPEZ, MARINA C  . furosemide (LASIX) 20 MG tablet 90 tablet 2    Sig: Take 1 tablet (20 mg total) by mouth daily.    Authorizing Provider: END, CHRISTOPHER    Ordering User: Britt Bottom

## 2019-02-26 NOTE — Telephone Encounter (Signed)
°*  STAT* If patient is at the pharmacy, call can be transferred to refill team.   1. Which medications need to be refilled? (please list name of each medication and dose if known)   Furosemide 20 mg po q d   plavix 75 mg po q d   2. Which pharmacy/location (including street and city if local pharmacy) is medication to be sent to? [facility]   3. Do they need a 30 day or 90 day supply? 90  Please call patient when sent he states he has had issues with multiple requests

## 2019-03-09 ENCOUNTER — Other Ambulatory Visit: Payer: Self-pay | Admitting: Internal Medicine

## 2019-04-15 ENCOUNTER — Other Ambulatory Visit
Admission: RE | Admit: 2019-04-15 | Discharge: 2019-04-15 | Disposition: A | Discharge status: Home / Self Care | Payer: Medicare Other | Source: Home / Self Care | Attending: Internal Medicine | Admitting: Internal Medicine

## 2019-04-15 ENCOUNTER — Ambulatory Visit
Admission: RE | Admit: 2019-04-15 | Discharge: 2019-04-15 | Disposition: A | Discharge status: Home / Self Care | Payer: Medicare Other | Attending: Internal Medicine | Admitting: Internal Medicine

## 2019-04-15 ENCOUNTER — Other Ambulatory Visit: Payer: Self-pay

## 2019-04-15 ENCOUNTER — Ambulatory Visit
Admission: RE | Admit: 2019-04-15 | Discharge: 2019-04-15 | Disposition: A | Discharge status: Home / Self Care | Payer: Medicare Other | Source: Ambulatory Visit | Attending: Internal Medicine | Admitting: Internal Medicine

## 2019-04-15 DIAGNOSIS — I5032 Chronic diastolic (congestive) heart failure: Secondary | ICD-10-CM | POA: Diagnosis not present

## 2019-04-15 DIAGNOSIS — Z79899 Other long term (current) drug therapy: Secondary | ICD-10-CM | POA: Insufficient documentation

## 2019-04-15 DIAGNOSIS — I509 Heart failure, unspecified: Secondary | ICD-10-CM | POA: Diagnosis not present

## 2019-04-15 LAB — COMPREHENSIVE METABOLIC PANEL
ALT: 17 U/L (ref 0–44)
AST: 23 U/L (ref 15–41)
Albumin: 3.8 g/dL (ref 3.5–5.0)
Alkaline Phosphatase: 56 U/L (ref 38–126)
Anion gap: 11 (ref 5–15)
BUN: 17 mg/dL (ref 8–23)
CO2: 25 mmol/L (ref 22–32)
Calcium: 9.2 mg/dL (ref 8.9–10.3)
Chloride: 107 mmol/L (ref 98–111)
Creatinine, Ser: 1.33 mg/dL — ABNORMAL HIGH (ref 0.61–1.24)
GFR calc Af Amer: >60 mL/min (ref >60)
GFR calc non Af Amer: 53 mL/min — ABNORMAL LOW (ref >60)
Glucose, Bld: 89 mg/dL (ref 70–99)
Potassium: 4.1 mmol/L (ref 3.5–5.1)
Sodium: 143 mmol/L (ref 135–145)
Total Bilirubin: 1.1 mg/dL (ref 0.3–1.2)
Total Protein: 6.9 g/dL (ref 6.5–8.1)

## 2019-04-15 LAB — TSH: TSH: 7.254 u[IU]/mL — ABNORMAL HIGH (ref 0.350–4.500)

## 2019-04-15 IMAGING — CR DG CHEST 2V
1 series · 2 of 2 positions shown · non-contrast
Comparison: [DATE] and earlier.

CLINICAL DATA: 72-year-old male with chronic heart failure in
long-term use of amiodarone.

EXAM:
CHEST - 2 VIEW

[Series 1: dg chest 2 view · 0.14mm/px · 2 of 2 slices shown]
[im 1/2]
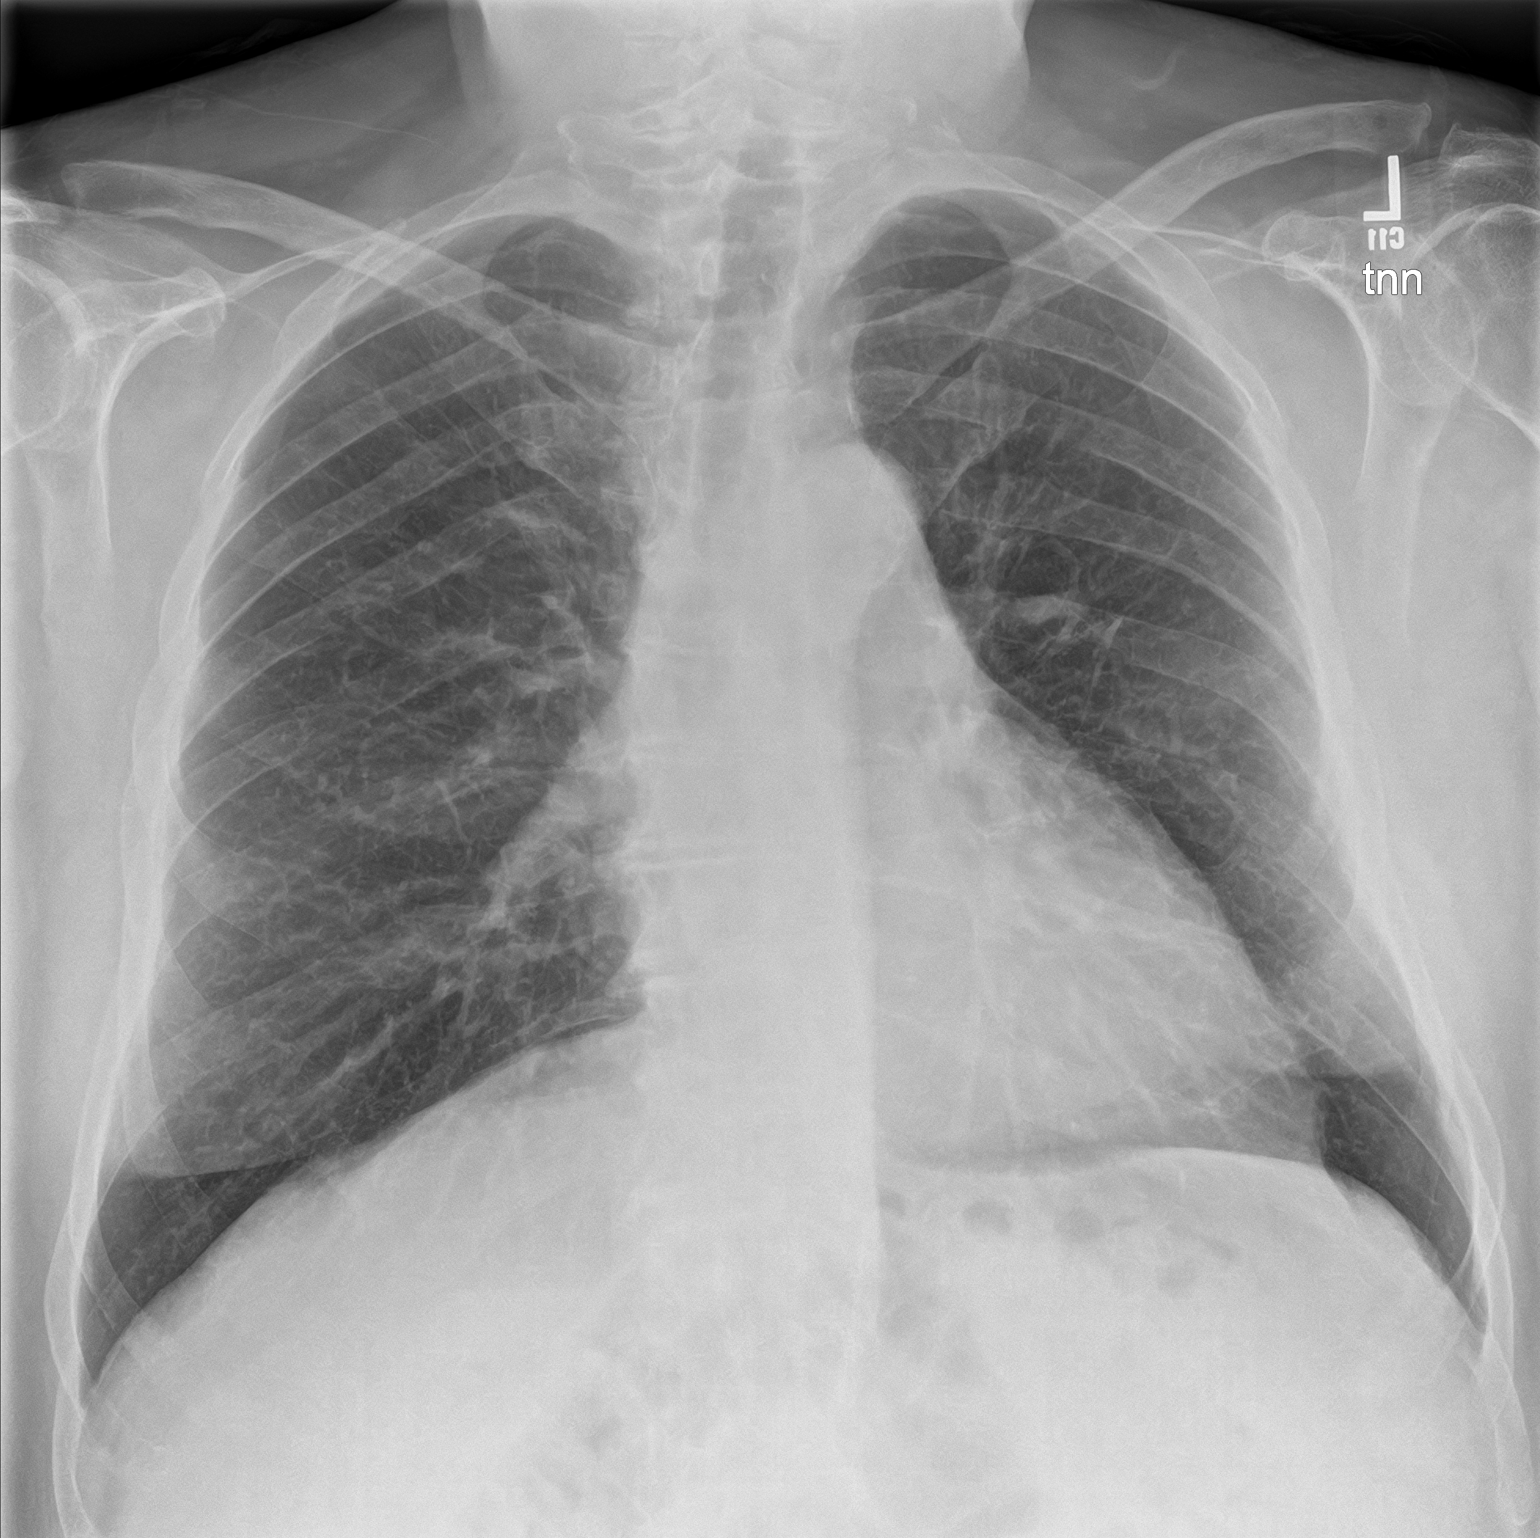
[im 2/2]
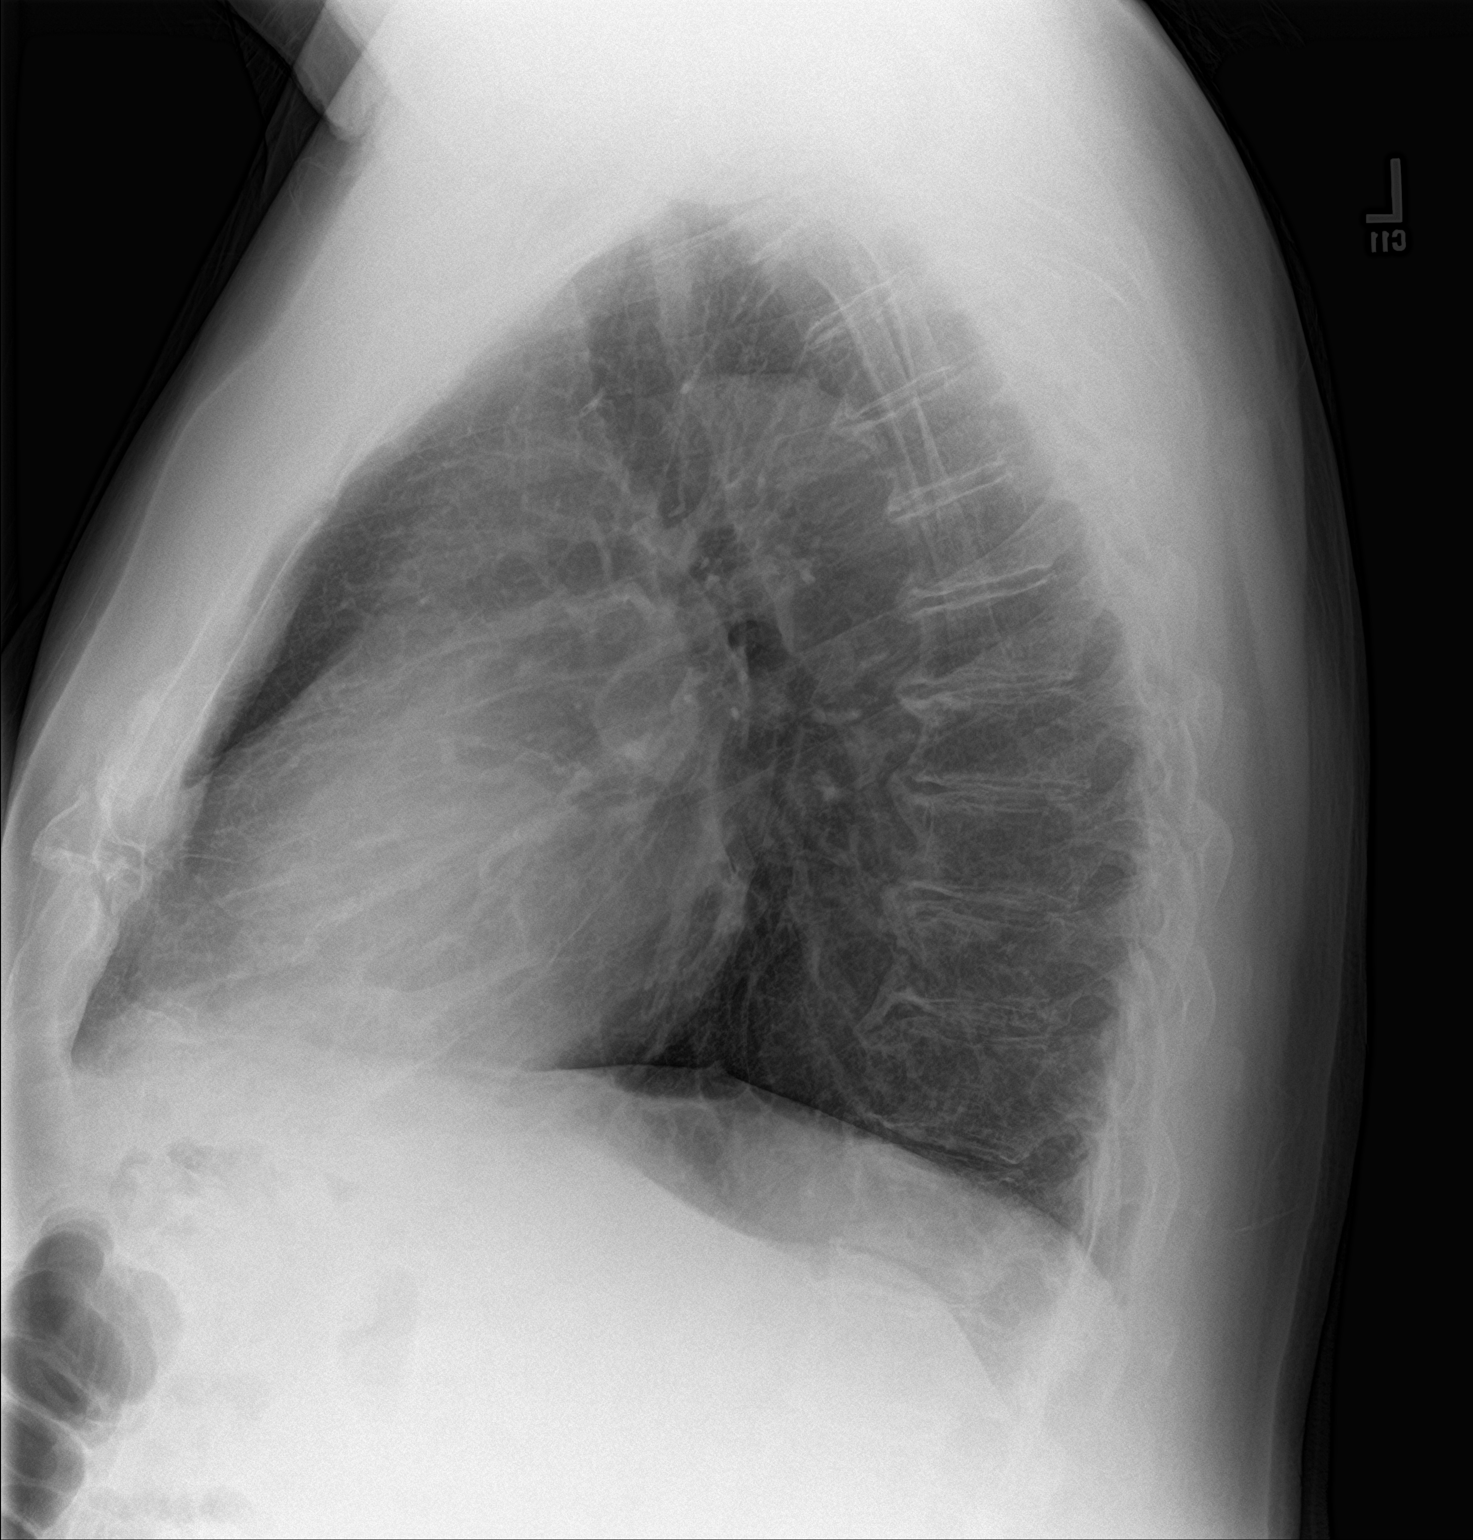

[2 of 2 positions shown; findings below may reference images not displayed]

FINDINGS: Normal lung volumes. Cardiac size at the upper limits of normal.
Other mediastinal contours are within normal limits. Visualized
tracheal air column is within normal limits.

Improved ventilation since the [0P] radiographs with resolved
interstitial edema and pleural effusions. Both lungs today appear
clear.

No acute osseous abnormality identified. Negative visible bowel gas
pattern.
IMPRESSION: Resolved edema and effusions since [0P]. No acute cardiopulmonary
abnormality.

## 2019-04-15 NOTE — Addendum Note (Signed)
Addended by: Santiago Bur on: 04/15/2019 08:51 AM   Modules accepted: Orders

## 2019-04-15 NOTE — Addendum Note (Signed)
Addended by: Santiago Bur on: 04/15/2019 08:50 AM   Modules accepted: Orders

## 2019-04-19 ENCOUNTER — Other Ambulatory Visit: Payer: Self-pay | Admitting: Family Medicine

## 2019-04-19 DIAGNOSIS — E039 Hypothyroidism, unspecified: Secondary | ICD-10-CM

## 2019-04-29 ENCOUNTER — Other Ambulatory Visit: Payer: Self-pay | Admitting: Family Medicine

## 2019-05-04 NOTE — Progress Notes (Signed)
Patient ID: James [name], male    DOB: [birth year], 73 y.o.   MRN: [id number]  HPI  James [name] is a 73 y/o male with a history of hyperlipidemia, HTN, thyroid disease, atrial fibrillation, obstructive sleep apnea, previous tobacco use and chronic heart failure.   Echo report from 05/09/18 reviewed and showed an EF of 55-60% along with mild James.   Right/ left cardiac catheterization done 05/12/18 which showed 85% stenosis in RCA with all coronary arteries being moderately calcified. Normal RA/RV pressures with normal left ventricular end-diastolic pressure of 5 mm Hg. Staged PCI tomorrow due to right groin hematoma. PCI with DES to the mid RCA successfully placed on 05/13/18.  Has not been admitted or been in the ED in the last 6 months.   He presents today for a follow-up visit with a chief complaint of minimal shortness of breath upon moderate exertion. He describes this as chronic in nature having been present for several years. He has associated fatigue and easy bruising along with this. He denies any difficulty sleeping, dizziness, abdominal distention, palpitations, pedal edema, chest pain or weight gain.   Past Medical History:  Diagnosis Date  . Acute lower GI bleeding after colonoscopy and polypectomy, resolved 12/12/2015  . Cardiac murmur    a. 02/2018 Echo: EF 60-65%, no rwma, mild AI, mildly dil Ao root/Asc Ao/Arch, Mild James, mildly dil LA. Nl RV fxn.  . Cataract    bilateral surgery to remove  . CHF (congestive heart failure) ([place])   . Colon polyp   . Constipation   . Dilated aortic root ([place])    a. 02/2018 Echo: mildly dil Ao root/asc ao/arch.  . Essential hypertension   . Hemorrhoids   . Hepatitis    self resolved, likely food exposure, 1974.   Marland Kitchen History of cardiovascular stress test    a. 02/2018 Myoview: EF 55-65%, small/mild apical defect w/ nl wall motion ->attenuation artifact. No ischemia. Low risk study.  Marland Kitchen Hx of adenomatous colonic polyps 12/05/2015  . Hyperlipidemia   .  Hypothyroidism   . Obstructive sleep apnea 05/23/2018  . Osteoarthritis   . Persistent atrial fibrillation    a. Dx 02/2018. CHA2DS2VASc = 2-->Eliquis; b. 03/2018 DCCV (200J); c. 03/2018 recurrent Afib-->flecainide started 04/2018;  d. 05/06/2018 s/p successful DCCV - 200J x 2.  . Skin cancer    basal cell, R ear, MOHS   Past Surgical History:  Procedure Laterality Date  . BROW LIFT Bilateral 10/23/2016   Procedure: BLEPHAROPLASTY upper eyelid with excess skin;  Surgeon: Karle Starch, MD;  Location: [facility];  Service: Ophthalmology;  Laterality: Bilateral;  MAC  . CARDIOVERSION N/A 03/18/2018   Procedure: CARDIOVERSION;  Surgeon: Nelva Bush, MD;  Location: [facility];  Service: Cardiovascular;  Laterality: N/A;  . CARDIOVERSION N/A 05/06/2018   Procedure: CARDIOVERSION;  Surgeon: Nelva Bush, MD;  Location: [facility];  Service: Cardiovascular;  Laterality: N/A;  . CATARACT EXTRACTION  1986   OD  . CATARACT EXTRACTION Bilateral 1995   x 2 for right and left   . COLONOSCOPY    . CORONARY STENT INTERVENTION N/A 05/13/2018   Procedure: CORONARY STENT INTERVENTION;  Surgeon: Nelva Bush, MD;  Location: [facility];  Service: Cardiovascular;  Laterality: N/A;  . ECTROPION REPAIR Bilateral 10/23/2016   Procedure: REPAIR OF ECTROPION sutures, extensive;  Surgeon: Karle Starch, MD;  Location: [facility];  Service: Ophthalmology;  Laterality: Bilateral;  . LIPOMA EXCISION  06/1997   left  neck (Juengel)  . MOHS SURGERY Right 2013   behind right ear  . RIGHT/LEFT HEART CATH AND CORONARY ANGIOGRAPHY N/A 05/12/2018   Procedure: RIGHT/LEFT HEART CATH AND CORONARY ANGIOGRAPHY;  Surgeon: Wellington Hampshire, MD;  Location: [facility];  Service: Cardiovascular;  Laterality: N/A;  . TONSILLECTOMY    . VASECTOMY  1982   Family History  Problem Relation Age of Onset  . Heart disease Paternal Grandfather        MI, old age  . Stroke Mother   . Lung cancer  Maternal Grandfather        smoker  . Stroke Maternal Grandfather   . Colon cancer Neg Hx   . Colon polyps Neg Hx   . Esophageal cancer Neg Hx   . Rectal cancer Neg Hx   . Stomach cancer Neg Hx   . Bladder Cancer Neg Hx   . Prostate cancer Neg Hx    Social History   Tobacco Use  . Smoking status: Former Smoker    Packs/day: 2.00    Years: 20.00    Pack years: 40.00    Types: Cigarettes    Quit date: 08/07/1983    Years since quitting: 35.7  . Smokeless tobacco: Never Used  . Tobacco comment: quit over 30 years ago  Substance Use Topics  . Alcohol use: Yes    Alcohol/week: 1.0 standard drinks    Types: 1 Glasses of wine per week    Comment: per day   Allergies  Allergen Reactions  . Sulfonamide Derivatives Rash   Prior to Admission medications   Medication Sig Start Date End Date Taking? Authorizing Provider  amiodarone (PACERONE) 200 MG tablet TAKE 1 TABLET BY MOUTH EVERY DAY 03/09/19  Yes End, Harrell Gave, MD  apixaban (ELIQUIS) 5 MG TABS tablet Take 1 tablet (5 mg total) by mouth 2 (two) times daily. 06/11/18  Yes Tonia Ghent, MD  atorvastatin (LIPITOR) 80 MG tablet TAKE 1 TABLET(80 MG) BY MOUTH DAILY 02/24/19  Yes End, Harrell Gave, MD  clopidogrel (PLAVIX) 75 MG tablet Take 1 tablet (75 mg total) by mouth daily. 02/26/19  Yes End, Harrell Gave, MD  DHA-EPA-Coenzyme Q10-Vitamin E (CO Q-10 VITAMIN E FISH OIL PO) Take 1 capsule by mouth daily.   Yes [provider]  diclofenac sodium (VOLTAREN) 1 % GEL Apply 4 g topically 4 (four) times daily as needed. Patient taking differently: Apply 4 g topically 4 (four) times daily as needed (knee pain.).  08/05/17  Yes Tonia Ghent, MD  doxazosin (CARDURA) 4 MG tablet Take 1 tablet (4 mg total) by mouth daily after breakfast. 05/07/18  Yes Billey Co, MD  ezetimibe (ZETIA) 10 MG tablet Take 10 mg by mouth daily.   Yes [provider]  finasteride (PROSCAR) 5 MG tablet Take 1 tablet (5 mg total) by mouth  daily. 02/25/19  Yes Billey Co, MD  furosemide (LASIX) 20 MG tablet Take 1 tablet (20 mg total) by mouth daily. 02/26/19  Yes End, Harrell Gave, MD  levothyroxine (SYNTHROID) 150 MCG tablet TAKE 1 TABLET BY MOUTH A DAY EXCEPT ONE-HALF TABLET ON SUNDAYS. TOTAL OF 6 AND ONE-HALF TABLETS PER WEEK 02/25/19  Yes Tonia Ghent, MD  Multiple Vitamin (MULTIVITAMIN WITH MINERALS) TABS tablet Take 1 tablet by mouth daily. Senior Multivitamin   Yes [provider]  psyllium (METAMUCIL) 58.6 % powder Take 1 packet by mouth daily.    Yes [provider]  ramipril (ALTACE) 10 MG capsule Take 1  capsule (10 mg total) by mouth daily. 09/17/18  Yes Tonia Ghent, MD    Review of Systems  Constitutional: Positive for fatigue. Negative for appetite change.  HENT: Negative for congestion, postnasal drip and sore throat.   Eyes: Negative.   Respiratory: Positive for shortness of breath (with moderate exertion). Negative for chest tightness.   Cardiovascular: Negative for chest pain, palpitations and leg swelling.  Gastrointestinal: Negative for abdominal distention and abdominal pain.  Endocrine: Negative.   Genitourinary: Negative.   Musculoskeletal: Negative for arthralgias and back pain.  Skin: Negative.   Allergic/Immunologic: Negative.   Neurological: Negative for dizziness and light-headedness.  Hematological: Negative for adenopathy. Bruises/bleeds easily.  Psychiatric/Behavioral: Negative for dysphoric mood and sleep disturbance. The patient is not nervous/anxious.    Vitals:   05/07/19 0840  BP: 135/79  Pulse: 84  Resp: 18  SpO2: 100%  Weight: 265 lb (120.2 kg)  Height: _0  (1.854 m)   Wt Readings from Last 3 Encounters:  05/07/19 265 lb (120.2 kg)  02/09/19 263 lb 12 oz (119.6 kg)  01/05/19 259 lb 8 oz (117.7 kg)   Lab Results  Component Value Date   CREATININE 1.33 (H) 04/15/2019   CREATININE 1.38 (H) 10/14/2018   CREATININE 1.34 (H) 07/16/2018     Physical Exam Vitals signs and nursing note reviewed.  Constitutional:      Appearance: He is well-developed.  HENT:     Head: Normocephalic and atraumatic.  Neck:     Musculoskeletal: Normal range of motion and neck supple.     Vascular: No JVD.  Cardiovascular:     Rate and Rhythm: Normal rate and regular rhythm.  Pulmonary:     Effort: Pulmonary effort is normal. No respiratory distress.     Breath sounds: No wheezing or rales.  Abdominal:     General: There is no distension.     Palpations: Abdomen is soft.  Musculoskeletal:        General: No tenderness.  Skin:    General: Skin is warm and dry.  Neurological:     Mental Status: He is alert and oriented to person, place, and time.  Psychiatric:        Behavior: Behavior normal.        Thought Content: Thought content normal.     Assessment & Plan:  1: Chronic heart failure with preserved ejection fraction- - NYHA class II - euvolemic today - weighing daily and he was reminded to call for an overnight weight gain of >2 pounds or a weekly weight gain of >5 pounds; home weight chart was reviewed - weight stable from last visit here 10 months ago - not adding salt but says that he doesn't really know how much sodium he's eating. Reviewed the importance of reading food labels so that he can keep his daily sodium intake to <208m daily.  - saw cardiology (End) 02/09/2019 - BNP 05/09/18 was 290.0 - trying to increase his activity and says that he's going to start lifting light weights - is getting his flu vaccine later today  2: HTN- - BP looks good today - saw PCP (Damita Dunnings 08/08/2018 - BMP 04/15/2019 reviewed and showed sodium 143, potassium 4.1, creatinine 1.33 and GFR 53  Medication bottles were reviewed.  Will not make a return appointment for patient at this time. Advised patient that he could call back at anytime to make another appointment. Patient is comfortable with this plan.

## 2019-05-07 ENCOUNTER — Encounter: Payer: Self-pay | Admitting: Family

## 2019-05-07 ENCOUNTER — Ambulatory Visit: Payer: Medicare Other | Attending: Family | Admitting: Family

## 2019-05-07 ENCOUNTER — Other Ambulatory Visit: Payer: Self-pay | Admitting: Family Medicine

## 2019-05-07 ENCOUNTER — Other Ambulatory Visit: Payer: Self-pay

## 2019-05-07 ENCOUNTER — Ambulatory Visit (INDEPENDENT_AMBULATORY_CARE_PROVIDER_SITE_OTHER): Payer: Medicare Other

## 2019-05-07 VITALS — BP 135/79 | HR 84 | Resp 18 | Ht 73.0 in | Wt 265.0 lb

## 2019-05-07 DIAGNOSIS — Z882 Allergy status to sulfonamides status: Secondary | ICD-10-CM | POA: Insufficient documentation

## 2019-05-07 DIAGNOSIS — E039 Hypothyroidism, unspecified: Secondary | ICD-10-CM | POA: Diagnosis not present

## 2019-05-07 DIAGNOSIS — Z79899 Other long term (current) drug therapy: Secondary | ICD-10-CM | POA: Diagnosis not present

## 2019-05-07 DIAGNOSIS — I4819 Other persistent atrial fibrillation: Secondary | ICD-10-CM | POA: Insufficient documentation

## 2019-05-07 DIAGNOSIS — Z85828 Personal history of other malignant neoplasm of skin: Secondary | ICD-10-CM | POA: Insufficient documentation

## 2019-05-07 DIAGNOSIS — Z87891 Personal history of nicotine dependence: Secondary | ICD-10-CM | POA: Diagnosis not present

## 2019-05-07 DIAGNOSIS — Z79890 Hormone replacement therapy (postmenopausal): Secondary | ICD-10-CM | POA: Insufficient documentation

## 2019-05-07 DIAGNOSIS — Z8249 Family history of ischemic heart disease and other diseases of the circulatory system: Secondary | ICD-10-CM | POA: Insufficient documentation

## 2019-05-07 DIAGNOSIS — Z86010 Personal history of colonic polyps: Secondary | ICD-10-CM | POA: Diagnosis not present

## 2019-05-07 DIAGNOSIS — E785 Hyperlipidemia, unspecified: Secondary | ICD-10-CM | POA: Insufficient documentation

## 2019-05-07 DIAGNOSIS — I2510 Atherosclerotic heart disease of native coronary artery without angina pectoris: Secondary | ICD-10-CM | POA: Insufficient documentation

## 2019-05-07 DIAGNOSIS — G4733 Obstructive sleep apnea (adult) (pediatric): Secondary | ICD-10-CM | POA: Diagnosis not present

## 2019-05-07 DIAGNOSIS — Z955 Presence of coronary angioplasty implant and graft: Secondary | ICD-10-CM | POA: Insufficient documentation

## 2019-05-07 DIAGNOSIS — I10 Essential (primary) hypertension: Secondary | ICD-10-CM

## 2019-05-07 DIAGNOSIS — I110 Hypertensive heart disease with heart failure: Secondary | ICD-10-CM | POA: Insufficient documentation

## 2019-05-07 DIAGNOSIS — Z7901 Long term (current) use of anticoagulants: Secondary | ICD-10-CM | POA: Diagnosis not present

## 2019-05-07 DIAGNOSIS — Z23 Encounter for immunization: Secondary | ICD-10-CM | POA: Diagnosis not present

## 2019-05-07 DIAGNOSIS — I5032 Chronic diastolic (congestive) heart failure: Secondary | ICD-10-CM

## 2019-05-07 DIAGNOSIS — Z8719 Personal history of other diseases of the digestive system: Secondary | ICD-10-CM | POA: Insufficient documentation

## 2019-05-07 NOTE — Patient Instructions (Signed)
Continue weighing daily and call for an overnight weight gain of > 2 pounds or a weekly weight gain of >5 pounds. 

## 2019-05-13 ENCOUNTER — Encounter: Payer: Self-pay | Admitting: Nurse Practitioner

## 2019-05-13 ENCOUNTER — Ambulatory Visit (INDEPENDENT_AMBULATORY_CARE_PROVIDER_SITE_OTHER): Payer: Medicare Other | Admitting: Physician Assistant

## 2019-05-13 ENCOUNTER — Other Ambulatory Visit: Payer: Self-pay

## 2019-05-13 VITALS — BP 110/64 | HR 92 | Ht 72.0 in | Wt 265.5 lb

## 2019-05-13 DIAGNOSIS — I714 Abdominal aortic aneurysm, without rupture, unspecified: Secondary | ICD-10-CM

## 2019-05-13 DIAGNOSIS — I5032 Chronic diastolic (congestive) heart failure: Secondary | ICD-10-CM | POA: Diagnosis not present

## 2019-05-13 DIAGNOSIS — I77810 Thoracic aortic ectasia: Secondary | ICD-10-CM

## 2019-05-13 DIAGNOSIS — E785 Hyperlipidemia, unspecified: Secondary | ICD-10-CM

## 2019-05-13 DIAGNOSIS — I2510 Atherosclerotic heart disease of native coronary artery without angina pectoris: Secondary | ICD-10-CM | POA: Diagnosis not present

## 2019-05-13 DIAGNOSIS — I10 Essential (primary) hypertension: Secondary | ICD-10-CM

## 2019-05-13 DIAGNOSIS — Z01810 Encounter for preprocedural cardiovascular examination: Secondary | ICD-10-CM

## 2019-05-13 DIAGNOSIS — I4819 Other persistent atrial fibrillation: Secondary | ICD-10-CM

## 2019-05-13 DIAGNOSIS — I340 Nonrheumatic mitral (valve) insufficiency: Secondary | ICD-10-CM

## 2019-05-13 DIAGNOSIS — Z79899 Other long term (current) drug therapy: Secondary | ICD-10-CM

## 2019-05-13 NOTE — Patient Instructions (Addendum)
Medication Instructions:  1- STOP Plavix If you need a refill on your cardiac medications before your next appointment, please call your pharmacy.   Lab work: None ordered  If you have labs (blood work) drawn today and your tests are completely normal, you will receive your results only by: Marland Kitchen MyChart Message (if you have MyChart) OR . A paper copy in the mail If you have any lab test that is abnormal or we need to change your treatment, we will call you to review the results.  Testing/Procedures: 1-  A CT Aorta has been ordered by your provider, Lorenso Quarry, PA.  Please call imaging to schedule at [phone].  CT Angiography (CTA), is a special type of CT scan that uses a computer to produce multi-dimensional views of major blood vessels throughout the body. In CT angiography, a contrast material is injected through an IV to help visualize the blood vessels  Please arrive at the [facility] at 30-45 minutes prior to test start time.   Please follow these instructions carefully (unless otherwise directed):   On the Night Before the Test: . Be sure to Drink plenty of water. . Do not consume any caffeinated/decaffeinated beverages or chocolate 12 hours prior to your test. . Do not take any antihistamines 12 hours prior to your test. . If you take Metformin do not take 24 hours prior to test. On the Day of the Test: . Drink plenty of water. Do not drink any water within one hour of the test. . Do not eat any food 4 hours prior to the test. . You may take your regular medications prior to the test.  . HOLD Furosemide/Hydrochlorothiazide morning of the test.        After the Test: . Drink plenty of water. . After receiving IV contrast, you may experience a mild flushed feeling. This is normal. . On occasion, you may experience a mild rash up to 24 hours after the test. This is not dangerous. If this occurs, you can take Benadryl 25 mg and increase your  fluid intake. . If you experience trouble breathing, this can be serious. If it is severe call 911 IMMEDIATELY. If it is mild, please call our office. . If you take any of these medications: Glipizide/Metformin, Avandament, Glucavance, please do not take 48 hours after completing test.   Follow-Up: At [facility], you and your health needs are our priority.  As part of our continuing mission to provide you with exceptional heart care, we have created designated Provider Care Teams.  These Care Teams include your primary Cardiologist (physician) and Advanced Practice Providers (APPs -  Physician Assistants and Nurse Practitioners) who all work together to provide you with the care you need, when you need it. You will need a follow up appointment in 6 months.  Please call our office 2 months in advance to schedule this appointment.  You may see Nelva Bush, MD or one of the following Advanced Practice Providers on your designated Care Team:   Murray Hodgkins, NP Christell Faith, PA-C . Marrianne Mood, PA-C  Any Other Special Instructions Will Be Listed Below (If Applicable). Please schedule echo today

## 2019-05-13 NOTE — Progress Notes (Signed)
Office Visit    Patient Name: MAVERIC DEBONO Date of Encounter: 05/13/2019  Primary Care Provider:  Tonia Ghent, MD Primary Cardiologist:  Nelva Bush, MD  Chief Complaint    73 year old male with a history of CAD s/p PCI to the RCA (05/2018), HFpEF, persistent atrial fibrillation, hypertension, hyperlipidemia, hypothyroidism, and obesity, and who presents for follow-up of CAD and HFpEF.  Past Medical History    Past Medical History:  Diagnosis Date   (HFpEF) heart failure with preserved ejection fraction ([place])    a. 05/2018 Echo: EF 55-60%, gr2 DD.   Acute lower GI bleeding after colonoscopy and polypectomy, resolved 12/12/2015   Ascending aortic aneurysm ([place])    a. 02/2018 Echo: mildly dil Ao root/asc ao/arch; b. 05/2018 CTA Chest: 4.7cm fusiform aneurysm of Asc Ao.   CAD (coronary artery disease)    a. 05/2018 Cath/PCI: LM nl,LAD 30p, 90/99d/apical, LCX 62m OM1/2/3 min irregs, [place]CA 85p [place]18 [place].   Cardiac murmur    a. 02/2018 Echo: EF 60-65%, no rwma, mild AI, mildly dil Ao root/Asc Ao/Arch, Mild MR, mildly dil LA. Nl RV fxn.   Cataract    bilateral surgery to remove   Colon polyp    Constipation    Essential hypertension    Hemorrhoids    Hepatitis    self resolved, likely food exposure, 1974.    History of cardiovascular stress test    a. 02/2018 Myoview: EF 55-65%, small/mild apical defect w/ nl wall motion ->attenuation artifact. No ischemia. Low risk study.   Hx of adenomatous colonic polyps 12/05/2015   Hyperlipidemia    Hypothyroidism    Mitral regurgitation    a. 110/2019 Echo: EF 55-60%. Gr2 DD. Triv AI. Ao root 317m Mild MR. Mod dil LA, mildly dil RA.   Obstructive sleep apnea 05/23/2018   Osteoarthritis    Persistent atrial fibrillation ([place]   a. Dx 02/2018. CHA2DS2VASc = 2-->Eliquis; b. 03/2018 DCCV (200J); c. 03/2018 recurrent Afib-->flecainide started 04/2018;  d. 05/06/2018 s/p successful DCCV - 200J x 2-->on amio.    Skin cancer    basal cell, R ear, MOHS   Past Surgical History:  Procedure Laterality Date   BROW LIFT Bilateral 10/23/2016   Procedure: BLEPHAROPLASTY upper eyelid with excess skin;  Surgeon: AmKarle StarchMD;  [place] Service: Ophthalmology;  Laterality: Bilateral;  MAC   CARDIOVERSION N/A 03/18/2018   Procedure: CARDIOVERSION;  Surgeon: EnNelva BushMD;  [place] [facility];  Service: Cardiovascular;  Laterality: N/A;   CARDIOVERSION N/A 05/06/2018   Procedure: CARDIOVERSION;  Surgeon: EnNelva BushMD;  Location: [facility];  Service: Cardiovascular;  Laterality: N/A;   CATARACT EXTRACTION  1986   OD   CATARACT EXTRACTION Bilateral 1995   x 2 for right and left    COLONOSCOPY     CORONARY STENT INTERVENTION N/A 05/13/2018   Procedure: CORONARY STENT INTERVENTION;  Surgeon: EnNelva BushMD;  [place];  Service: Cardiovascular;  Laterality: N/A;   ECTROPION REPAIR Bilateral 10/23/2016   Procedure: REPAIR OF ECTROPION sutures, extensive;  Surgeon: AmKarle StarchMD;  [place] Service: Ophthal[place];  Laterality: Bilateral;   LIPOMA EXCISION  06/1997   left neck ([place])   MOHS SURGERY Right 2013   behind right ear   RIGHT/LEFT HEART CATH AND CORONARY ANGIOGRAPHY N/A 05/12/2018   Procedure: RIGHT/LEFT HEART CATH AND CORONARY ANGIOGRAPHY;  Surgeon: ArWellington HampshireMD;  [place] [facility];  Service: Cardiovascular;  Laterality: N/A;   TONSILLECTOMY     VASECTOMY  1982    Allergies  Allergies  Allergen Reactions   Sulfonamide Derivatives Rash    History of Present Illness    Mr. Creson is a 73 year old male with PMH as above and here for follow-up of CAD and HFpEF.  He is s/p PCI to the RCA 05/2018 with recommendation for 12 months of antiplatelet/antithrombotic therapy with clopidogrel and apixaban.  He also has medical management of severe diffuse distal LAD disease. His most  recent echo was 05/2018, at which time he had moderate concentric hypertrophy, EF 55 to 60%, G2 DD, aortic root 38 mm, and mild MR.  It was noted that he would need a TEE if MR was significant in the future.  05/2018 CTA showed fusiform aneurysm of the ascending aorta with maximal dimension 4.7 cm and recommendation for semiannual imaging follow-up by CTA or MRA, as well as referral to cardiothoracic surgery.   He was last seen in the office 02/09/2019 by Dr. Saunders Revel, at which time he was doing relatively well, though still reporting some low energy. He attributed his low energy due to chronic left knee pain, for which he was considering surgery. However, he was still able to walk several 100 yards without chest pain or shortness of breath, including up a slight incline. He was noted to be euvolemic on exam. He was maintaining NSR on amiodarone with plan to recheck LFTs, TSH, and chest x-ray given long-term amiodarone use. Otherwise, it was felt to be reasonable for him to proceed with knee surgery without further cardiac testing at that time.   Since that time, the patient reports that he is doing well and without any cardiac concerns. He has not yet had his knee surgery but is still intending to eventually have it performed to help with his chronic L knee pain. He continues to report low energy, still associating it as 2/2 knee pain. He is still able to walk several 100 yards daily without CP or SOB.  He denies any recent sx consistent with worsening heart failure and  including lower extremity edema, abdominal distention, orthopnea, or early satiety.  In clinic, he is maintaining sinus rhythm on amiodarone with labs showing stable LFTs. He plans to follow-up with his PCP regarding his elevation in TSH and recommendations for Synthroid at his upcoming appointment.  Chest x-ray showed resolved edema and effusions, noted on his 2019 CXR.  He reports medication compliance with Plavix and Eliquis.  No signs or symptoms  of acute bleeding including hematuria, hematochezia, hemoptysis, or melena.  He does note that he will bleed for longer when he cuts himself but is not concerned, as he notes that this is typical for someone on his medications.  He denies any recent presyncope or syncope.  He checks his BP at home and notes SBP is usually 176H but is not certain of the accuracy of his cough and intends on checking with his insurance regarding obtaining a new one or bringing it into his next appointment to confirm its accuracy.   Home Medications    Prior to Admission medications   Medication Sig Start Date End Date Taking? Authorizing Provider  amiodarone (PACERONE) 200 MG tablet TAKE 1 TABLET BY MOUTH EVERY DAY 03/09/19   End, Harrell Gave, MD  apixaban (ELIQUIS) 5 MG TABS tablet Take 1 tablet (5 mg total) by mouth 2 (two) times daily. 06/11/18   Tonia Ghent, MD  atorvastatin (LIPITOR) 80 MG tablet TAKE 1 TABLET(80 MG) BY MOUTH DAILY 02/24/19   End, Harrell Gave, MD  clopidogrel (PLAVIX) 75 MG tablet Take 1 tablet (75 mg total) by mouth daily. 02/26/19   End, Harrell Gave, MD  DHA-EPA-Coenzyme Q10-Vitamin E (CO Q-10 VITAMIN E FISH OIL PO) Take 1 capsule by mouth daily.    [provider]  diclofenac sodium (VOLTAREN) 1 % GEL Apply 4 g topically 4 (four) times daily as needed. Patient taking differently: Apply 4 g topically 4 (four) times daily as needed (knee pain.).  08/05/17   Tonia Ghent, MD  doxazosin (CARDURA) 4 MG tablet Take 1 tablet (4 mg total) by mouth daily after breakfast. 05/07/18   Billey Co, MD  ezetimibe (ZETIA) 10 MG tablet Take 10 mg by mouth daily.    [provider]  finasteride (PROSCAR) 5 MG tablet Take 1 tablet (5 mg total) by mouth daily. 02/25/19   Billey Co, MD  furosemide (LASIX) 20 MG tablet Take 1 tablet (20 mg total) by mouth daily. 02/26/19   End, Harrell Gave, MD  levothyroxine (SYNTHROID) 150 MCG tablet TAKE 1 TABLET BY MOUTH A DAY EXCEPT ONE-HALF  TABLET ON SUNDAYS. TOTAL OF 6 AND ONE-HALF TABLETS PER WEEK 05/08/19   Tonia Ghent, MD  Multiple Vitamin (MULTIVITAMIN WITH MINERALS) TABS tablet Take 1 tablet by mouth daily. Senior Multivitamin    [provider]  psyllium (METAMUCIL) 58.6 % powder Take 1 packet by mouth daily.     [provider]  ramipril (ALTACE) 10 MG capsule Take 1 capsule (10 mg total) by mouth daily. 09/17/18   Tonia Ghent, MD    Review of Systems    He denies chest pain, palpitations, dyspnea, pnd, orthopnea, n, v, dizziness, syncope, edema, weight gain, or early satiety.  All other systems reviewed and are otherwise negative except as noted above.  Physical Exam    VS:  BP 110/64 (BP Location: Left Arm, Patient Position: Sitting, Cuff Size: Large)    Pulse 92    Ht 6' (1.829 m)    Wt 265 lb 8 oz (120.4 kg)    SpO2 99%    BMI 36.01 kg/m  , BMI Body mass index is 36.01 kg/m. GEN: Well nourished, well developed, in no acute distress. HEENT: normal. Neck: Supple, no JVD, carotid bruits, or masses. Cardiac: RRR, 1/6 systolic murmur; no, rubs or gallops. No clubbing, cyanosis.  Radials/DP/PT 2+ and equal bilaterally.   Left lower extremity edema 1+ (on side of L knee injury), right lower extremity mild nonpitting edema Respiratory:  Respirations regular and unlabored, clear to auscultation bilaterally. GI: Soft, nontender, nondistended, BS + x 4. MS: no deformity or atrophy. edema moderate nonpitting. Skin: warm and dry, no rash. Neuro:  Strength and sensation are intact. Psych: Normal affect.  Accessory Clinical Findings    ECG personally reviewed by me today -NSR, LAFB, 92 bpm, no acute changes  Labs trended- 10/14/2018  04/15/2019:  Creatinine 1.38  1.33 /   BUN 18  17 AST 17  23  /  ALT 12  17 TSH 9.078  4.211   7.254  (no FT4).  Chest x-ray: Improve / resolved edema and effusions since 2019  Echo 05/2018 - Procedure narrative: Transthoracic echocardiography. Image   quality was  adequate. The study was technically difficult, as a   result of poor acoustic windows and poor sound wave transmission.   Intravenous contrast (Definity) was administered. - Left ventricle: The cavity  size was normal. There was moderate   concentric hypertrophy. Systolic function was normal. The   estimated ejection fraction was in the range of 55% to 60%. Wall   motion was normal; there were no regional wall motion   abnormalities. Features are consistent with a pseudonormal left   ventricular filling pattern, with concomitant abnormal relaxation   and increased filling pressure (grade 2 diastolic dysfunction). - Aortic valve: There was trivial regurgitation. - Aorta: Aortic root dimension: 38 mm (ED). - Mitral valve: Poorly visualized. There was mild regurgitation. - Left atrium: The atrium was moderately dilated. - Right atrium: The atrium was mildly dilated. - Pulmonary arteries: Systolic pressure could not be accurately   estimated.  CTA 05/2018 IMPRESSION: 1. No pulmonary embolus. 2. CHF with cardiomegaly, pulmonary edema, and bilateral pleural effusions. 3. Fusiform aneurysm of the ascending aorta, maximal dimension 4.7 cm. Recommend semi-annual imaging followup by CTA or MRA and referral to cardiothoracic surgery if not already obtained. This recommendation follows 2010 ACCF/AHA/AATS/ACR/ASA/SCA/SCAI/SIR/STS/SVM Guidelines for the Diagnosis and Management of Patients With Thoracic Aortic Disease. Circulation. 2010; 121: O294-T654 4. Aortic aneurysm NOS (ICD10-I71.9). Aortic Atherosclerosis (ICD10-I70.0).  Assessment & Plan    Coronary artery disease - No chest pain or shortness of breath, suggestive of worsening coronary insufficiency s/p PCI to the RCA 05/2018.  He also has a history of diffuse distal LAD disease, which has been medically managed. --Discontinue Plavix today, as he has completed 12 months of antiplatelet/antithrombotic therapy per recommendations.   --Update echo, which was ordered but not yet scheduled echocardiogram with previous 05/2018 echo above with normal EF.  As below, he does has a history of mitral regurgitation and dilated aortic root that will need periodic monitoring.  Chronic HFpEF --Euvolemic on exam. No signs or symptoms of worsening heart failure, such as SOB, orthopnea, or abdominal distention.  He does note dependent edema for which she will elevate his lower extremities in the evening and wear compression socks.   --He does have asymmetric edema on exam today with left lower extremity greater than right lower extremity; however, given he has been compliant with Plavix and Eliquis. Edema is also associated with his LLE, on which side he has knee discomfort/injury, I have low clinical suspicion for DVT.  He denies any asymmetrical erythema or pain associated with this edema and is without palpable cord on exam.  He is unable to state if this is a chronic finding for him, as he did not notice the asymmetrical edema until pointed out on exam today. Based on exam and risk factors, it is likely that his edema is greater on his LLE 2/2 his knee problems. --Update echo as above to reassess EF. Recent labs show stable renal function and electrolytes. Continue Lasix 20 mg daily.  Persistent atrial fibrillation - EKG shows he is maintaining NSR.  He reported he is usually asymptomatic in atrial fibrillation.  As above, repeat liver function stable. He intends to follow-up with his PCP regarding adjustment to his Synthroid if needed, given his elevated TSH.  Updated chest x-ray without acute findings. Continue amiodarone and Eliquis.    H/o fusiform aneurysm of the ascending aorta (maximal dimension 4.7 cm) H/o echo with aortic root dilation of 38 mm --05/2018 CTA showed fusiform aneurysm of max 4.7 cm with recommendation for semiannual imaging and follow-up by CTA or MRI, as well as referral to cardiothoracic surgery.  Patient has not had  updated imaging obtained since that time, and he has not seen CTS.  Scheduled for CTA to confirm previous findings and need for referral to CTS to be determined on repeat imaging.  --05/2018 echo with aortic root dimension 38 mm.  Given poor quality of study, recommend repeat echocardiogram at this time to reassess aortic root, MR, and LVEF.   --Encouraged him to continue to monitor his blood pressure / HR at home, given these findings as above. Both HR and BP are well controlled at this time.  Mild MR --No progressive shortness of breath or presyncope reported.  No mild murmur heard on exam.  05/2018 echo with recommendation for TEE if significant MR suspected.  Repeat echo as above.  HTN - Blood pressure well controlled.  Continue doxazosin and ramipril.  Hyperlipidemia -LDL borderline at 71 on last check.  Continue statin and Zetia.  Consider recheck of lipid panel at future follow-up appointment to ensure no adjustment to statin needed at that time.  Perioperative risk assessment: As previously noted, functional capacity is somewhat limited by knee pain.  He continues to be able to walk several 100 yards, consistent with at least 4 METS of activity without chest pain or significant dyspnea. Recommend update echo and CTA as above. If no acute findings on echo or CTA, it would be reasonable to proceed to surgery without further cardiac testing. Eliquis would need to be held 2 days prior to surgery with restart when safe to do so after surgery as determined by his surgical team.  RTC: 6 months or sooner if needed.  Disposition: Obtain CTA and echocardiogram.  Referral to CTS to be determined based on findings of CTA.  Discontinue Plavix, as he is now 12 months s/p PCI to the RCA.   Follow-up in 6 months or sooner if needed.      [name], PA-C 05/13/2019, 1:01 PM

## 2019-06-01 ENCOUNTER — Other Ambulatory Visit: Payer: Self-pay | Admitting: *Deleted

## 2019-06-01 NOTE — Telephone Encounter (Signed)
Please advise if ok to refill Zetia 10 mg qd. Last filled by Historical Provider.

## 2019-06-02 ENCOUNTER — Ambulatory Visit
Admission: RE | Admit: 2019-06-02 | Discharge: 2019-06-02 | Disposition: A | Discharge status: Home / Self Care | Payer: Medicare Other | Source: Ambulatory Visit | Attending: Nurse Practitioner | Admitting: Nurse Practitioner

## 2019-06-02 ENCOUNTER — Other Ambulatory Visit: Payer: Self-pay

## 2019-06-02 ENCOUNTER — Telehealth: Payer: Self-pay

## 2019-06-02 DIAGNOSIS — Z79899 Other long term (current) drug therapy: Secondary | ICD-10-CM

## 2019-06-02 DIAGNOSIS — I712 Thoracic aortic aneurysm, without rupture: Secondary | ICD-10-CM | POA: Diagnosis not present

## 2019-06-02 DIAGNOSIS — I714 Abdominal aortic aneurysm, without rupture, unspecified: Secondary | ICD-10-CM

## 2019-06-02 DIAGNOSIS — R7989 Other specified abnormal findings of blood chemistry: Secondary | ICD-10-CM

## 2019-06-02 LAB — POCT I-STAT CREATININE: Creatinine, Ser: 1.6 mg/dL — ABNORMAL HIGH (ref 0.61–1.24)

## 2019-06-02 IMAGING — CT CT ANGIO CHEST
2 of 6 series · 13 of 36 positions shown · IV contrast (omnipaque)
Comparison: CTA of the chest on [DATE]

CLINICAL DATA: Aneurysmal disease of the thoracic aorta detected by
prior CT angiography.

EXAM:
CT ANGIOGRAPHY CHEST WITH CONTRAST
TECHNIQUE: Multidetector CT imaging of the chest was performed using the
standard protocol during bolus administration of intravenous
contrast. Multiplanar CT image reconstructions and MIPs were
obtained to evaluate the vascular anatomy.
CONTRAST:  65mL OMNIPAQUE IOHEXOL 350 MG/ML SOLN

[Series 10: axial arterial cta thorax 2.00 · axial · arterial · 0.81mm/px · z∈[-1237,-919]mm · 12 of 189 slices shown]
[im 15/189  lung]
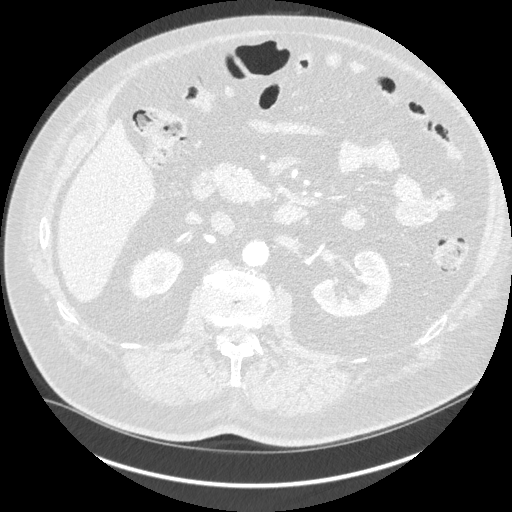
[im 29/189  mediastinal]
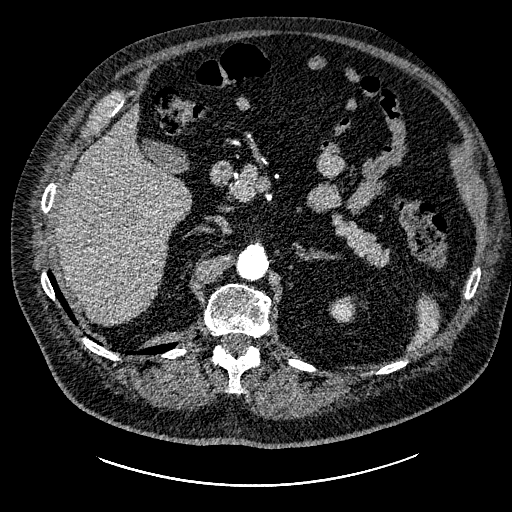
[im 44/189  lung]
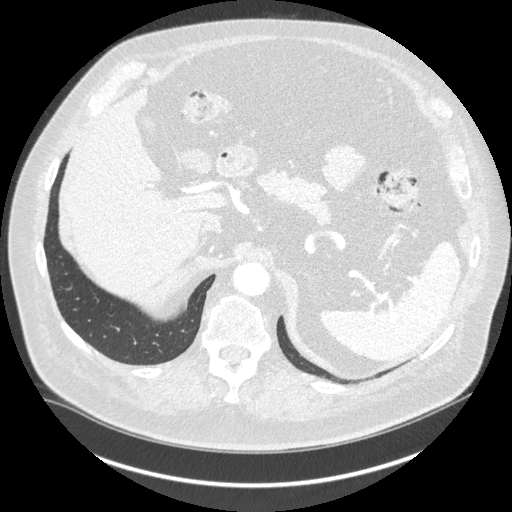
[im 58/189  mediastinal]
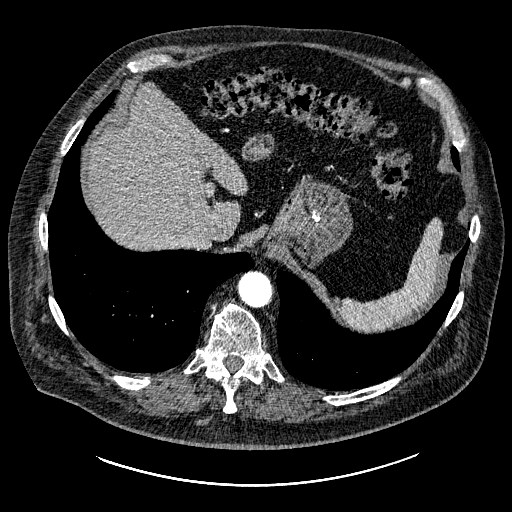
[im 73/189  lung]
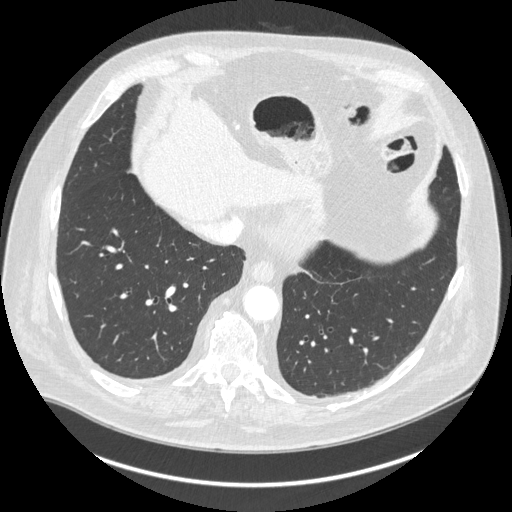
[im 87/189  mediastinal]
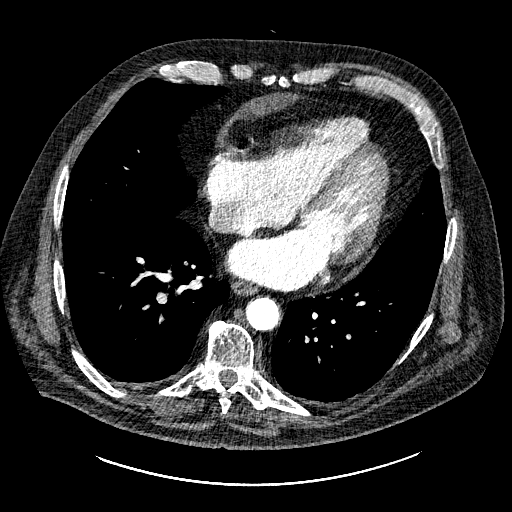
[im 102/189  lung]
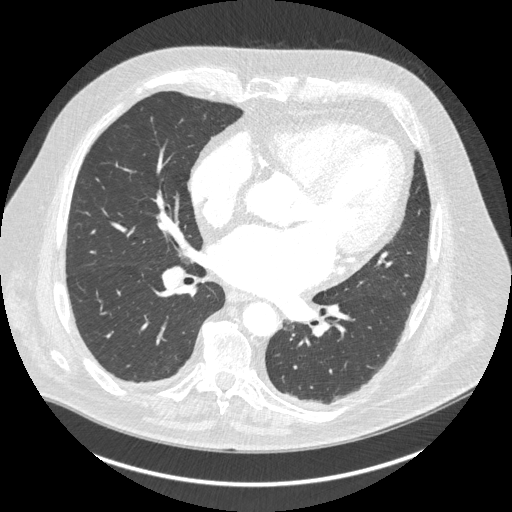
[im 116/189  mediastinal]
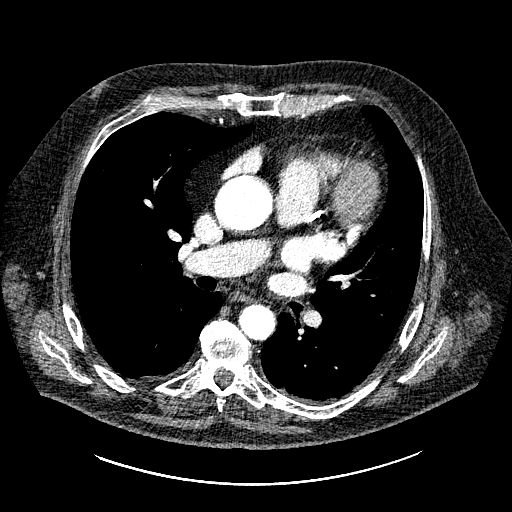
[im 131/189  lung]
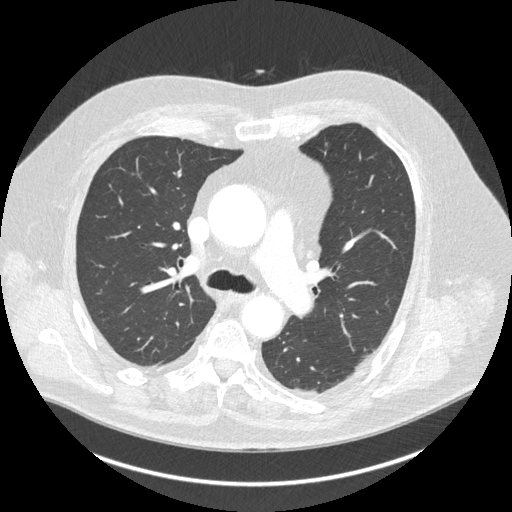
[im 145/189  mediastinal]
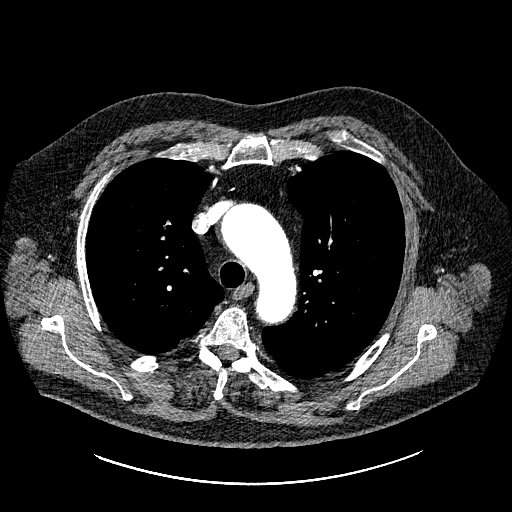
[im 160/189  lung]
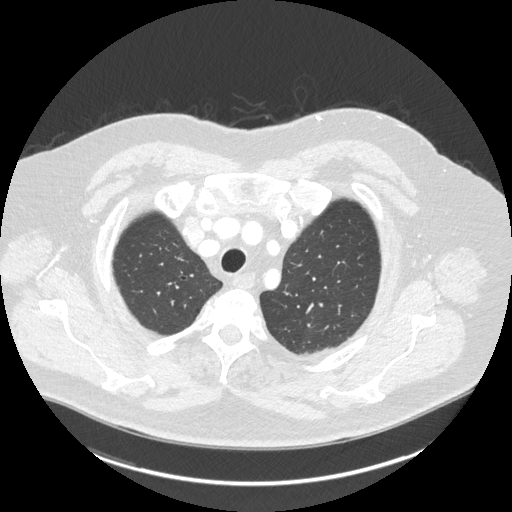
[im 174/189  mediastinal]
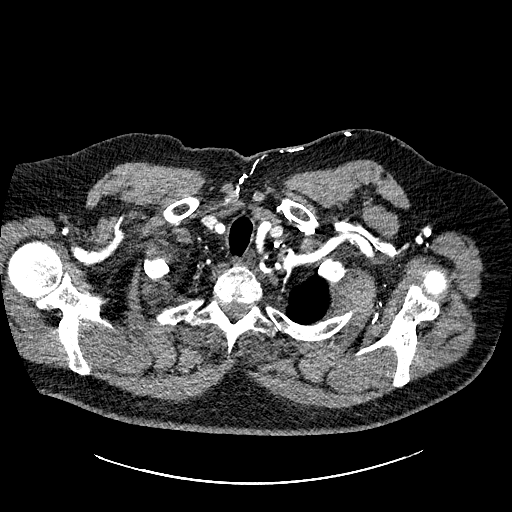

[Series 13: cor st cta thorax 2.00 cor · coronal · 0.74mm/px · 1 of 181 slices shown]
[im 91/181  mediastinal]
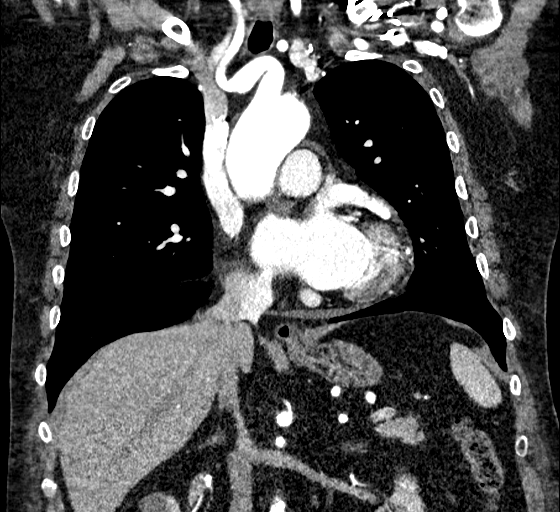

[13 of 36 positions shown; findings below may reference images not displayed]

FINDINGS: Cardiovascular: There is stable aneurysmal disease of the thoracic
aorta. The aortic root measures approximately 4.1-4.3 cm at the
level of the sinuses of Valsalva. The ascending thoracic aorta
measures 4.5-4.6 cm in greatest diameter. The proximal arch measures
4.3 cm and the distal arch 3.0 cm. The descending thoracic aorta
measures 2.8 cm. Proximal great vessels demonstrate tortuosity and
normal patency without significant atherosclerotic plaque. Mild
calcified plaque is present at the level of the aortic arch and
descending thoracic aorta. No evidence of aortic dissection.

Coronary atherosclerosis present with plaque in a 3 vessel
distribution. There appears to be a stent in the RCA. The heart is
mildly enlarged. No pericardial fluid identified. Central pulmonary
arteries are normal in caliber.

Mediastinum/Nodes: No enlarged mediastinal, hilar, or axillary lymph
nodes. Thyroid gland, trachea, and esophagus demonstrate no
significant findings. There is a small hiatal hernia.

Lungs/Pleura: There is no evidence of pulmonary edema,
consolidation, pneumothorax, nodule or pleural fluid.

Upper Abdomen: No acute abnormality.

Musculoskeletal: No chest wall abnormality. No acute or significant
osseous findings.

Review of the MIP images confirms the above findings.
IMPRESSION: 1. Stable aneurysmal disease of the thoracic aorta with aortic root
dilatation measuring 4.1-4.3 cm, ascending thoracic aortic
dilatation measuring 4.5-4.6 cm and proximal arch dilatation
measuring 4.3 cm. No evidence of aortic dissection. Ascending
thoracic aortic aneurysm. Recommend semi-annual imaging followup by
CTA or MRA and referral to cardiothoracic surgery if not already
obtained. This recommendation follows [V6]
ACCF/AHA/AATS/ACR/ASA/SCA/WELDING/WELDING/WELDING/WELDING Guidelines for the
Diagnosis and Management of Patients With Thoracic Aortic Disease.
Circulation. [V6]; 121: [id number]. Aortic aneurysm NOS ([V6]-[V6])
2. Coronary atherosclerosis with prior stent placement in the RCA.
3. Small hiatal hernia.

Aortic aneurysm NOS ([V6]-[V6]).

## 2019-06-02 MED ORDER — IOHEXOL 350 MG/ML SOLN
75.0000 mL | Freq: Once | INTRAVENOUS | Status: AC | PRN
  Administered 2019-06-02: 65 mL via INTRAVENOUS

## 2019-06-02 MED ORDER — EZETIMIBE 10 MG PO TABS: 10.0000 mg | ORAL_TABLET | Freq: Every day | ORAL | 2 refills | Status: DC

## 2019-06-02 NOTE — Telephone Encounter (Signed)
-----   Message from [name], PA-C sent at 06/02/2019  4:20 PM EDT ----- His renal function showed a bump in creatinine. Please have him hydrate very well / increase water after his contrast exposure. Follow up BMET in 1 week.

## 2019-06-02 NOTE — Telephone Encounter (Signed)
Pt verbalized understanding and will return in one week for labs.

## 2019-06-08 ENCOUNTER — Other Ambulatory Visit: Payer: Self-pay | Admitting: Family Medicine

## 2019-06-08 ENCOUNTER — Telehealth: Payer: Self-pay

## 2019-06-08 DIAGNOSIS — I4891 Unspecified atrial fibrillation: Secondary | ICD-10-CM

## 2019-06-08 MED ORDER — EZETIMIBE 10 MG PO TABS: 10.0000 mg | ORAL_TABLET | Freq: Every day | ORAL | 3 refills | Status: DC

## 2019-06-08 NOTE — Telephone Encounter (Signed)
Sent. Thanks.   

## 2019-06-08 NOTE — Telephone Encounter (Signed)
Electronic refill request. Eliquis Last office visit:   08/08/2018 Last Filled:    60 tablet 12 06/11/2018  Please advise.

## 2019-06-08 NOTE — Telephone Encounter (Signed)
Requested Prescriptions   Signed Prescriptions Disp Refills  . ezetimibe (ZETIA) 10 MG tablet 90 tablet 3    Sig: Take 1 tablet (10 mg total) by mouth daily.    Authorizing Provider: END, CHRISTOPHER    Ordering User: Raelene Bott, Gisele Pack L

## 2019-06-09 ENCOUNTER — Encounter: Payer: Self-pay | Admitting: Emergency Medicine

## 2019-06-09 ENCOUNTER — Ambulatory Visit
Admission: EM | Admit: 2019-06-09 | Discharge: 2019-06-09 | Disposition: A | Discharge status: Home / Self Care | Payer: Medicare Other | Attending: Family Medicine | Admitting: Family Medicine

## 2019-06-09 ENCOUNTER — Telehealth: Payer: Self-pay | Admitting: Physician Assistant

## 2019-06-09 ENCOUNTER — Other Ambulatory Visit: Payer: Self-pay

## 2019-06-09 ENCOUNTER — Other Ambulatory Visit
Admission: RE | Admit: 2019-06-09 | Discharge: 2019-06-09 | Disposition: A | Discharge status: Home / Self Care | Payer: Medicare Other | Source: Ambulatory Visit | Attending: Physician Assistant | Admitting: Physician Assistant

## 2019-06-09 ENCOUNTER — Telehealth: Payer: Self-pay

## 2019-06-09 DIAGNOSIS — N289 Disorder of kidney and ureter, unspecified: Secondary | ICD-10-CM

## 2019-06-09 DIAGNOSIS — W260XXA Contact with knife, initial encounter: Secondary | ICD-10-CM

## 2019-06-09 DIAGNOSIS — S61211A Laceration without foreign body of left index finger without damage to nail, initial encounter: Secondary | ICD-10-CM

## 2019-06-09 LAB — BASIC METABOLIC PANEL
Anion gap: 10 (ref 5–15)
BUN: 14 mg/dL (ref 8–23)
CO2: 26 mmol/L (ref 22–32)
Calcium: 8.8 mg/dL — ABNORMAL LOW (ref 8.9–10.3)
Chloride: 104 mmol/L (ref 98–111)
Creatinine, Ser: 1.62 mg/dL — ABNORMAL HIGH (ref 0.61–1.24)
GFR calc Af Amer: 49 mL/min — ABNORMAL LOW (ref >60)
GFR calc non Af Amer: 42 mL/min — ABNORMAL LOW (ref >60)
Glucose, Bld: 89 mg/dL (ref 70–99)
Potassium: 4.2 mmol/L (ref 3.5–5.1)
Sodium: 140 mmol/L (ref 135–145)

## 2019-06-09 NOTE — ED Provider Notes (Signed)
James [name]    CSN: [id number] Arrival date & time: 06/09/19  1623      History   Chief Complaint Chief Complaint  Patient presents with  . Laceration    HPI James [name] is a 73 y.o. male.   Patient is a 73 year old male currently on Eliquis.  He presents today with laceration to left pointer finger.  This occurred 20 minutes prior to arrival while using a box cutter.  Symptoms been constant.  The finger is slow bleeding.  Denies any significant pain, numbness or tingling.  He has been applying pressure for treatment.  ROS per HPI    Laceration   Past Medical History:  Diagnosis Date  . (HFpEF) heart failure with preserved ejection fraction ([place])    a. 05/2018 Echo: EF 55-60%, gr2 DD.  Marland Kitchen Acute lower GI bleeding after colonoscopy and polypectomy, resolved 12/12/2015  . Ascending aortic aneurysm ([place])    a. 02/2018 Echo: mildly dil Ao root/asc ao/arch; b. 05/2018 CTA Chest: 4.7cm fusiform aneurysm of Asc Ao.  Marland Kitchen CAD (coronary artery disease)    a. 05/2018 Cath/PCI: LM nl,LAD 30p, 90/99d/apical, LCX 51m OM1/2/3 min irregs, RCA 85p (3.5x18 [place].  . Cardiac murmur    a. 02/2018 Echo: EF 60-65%, no rwma, mild AI, mildly dil Ao root/Asc Ao/Arch, Mild MR, mildly dil LA. Nl RV fxn.  . Cataract    bilateral surgery to remove  . Colon polyp   . Constipation   . Essential hypertension   . Hemorrhoids   . Hepatitis    self resolved, likely food exposure, 1974.   .Marland KitchenHistory of cardiovascular stress test    a. 02/2018 Myoview: EF 55-65%, small/mild apical defect w/ nl wall motion ->attenuation artifact. No ischemia. Low risk study.  .Marland KitchenHx of adenomatous colonic polyps 12/05/2015  . Hyperlipidemia   . Hypothyroidism   . Mitral regurgitation    a. 110/2019 Echo: EF 55-60%. Gr2 DD. Triv AI. Ao root 373m Mild MR. Mod dil LA, mildly dil RA.  . Marland Kitchenbstructive sleep apnea 05/23/2018  . Osteoarthritis   . Persistent atrial fibrillation ([place]   a. Dx 02/2018. CHA2DS2VASc =  2-->Eliquis; b. 03/2018 DCCV (200J); c. 03/2018 recurrent Afib-->flecainide started 04/2018;  d. 05/06/2018 s/p successful DCCV - 200J x 2-->on amio.  . Skin cancer    basal cell, R ear, MOHS    Patient Active Problem List   Diagnosis Date Noted  . Chronic heart failure with preserved ejection fraction ([facility] [place]01/13/2020  . Hyperlipidemia LDL goal <70 08/18/2018  . Obstructive sleep apnea 05/23/2018  . Coronary artery disease involving native coronary artery of native heart without angina pectoris 05/16/2018  . Unstable angina ([place]  . CHF (congestive heart failure) (HCChignik Lagoon10/11/2017  . Benign prostatic hyperplasia with nocturia 05/07/2018  . Persistent atrial fibrillation ([place]CL[place]07/05/2018  . Cardiac murmur 02/12/2018  . Knee pain 08/07/2017  . Weak urinary stream 05/01/2016  . Healthcare maintenance 05/01/2016  . Hematuria 03/06/2016  . Hx of adenomatous colonic polyps 12/05/2015  . Advance care planning 07/21/2014  . Medicare annual wellness visit, subsequent 07/01/2012  . External hemorrhoids 06/27/2011  . Hypothyroidism 02/24/2008  . HYPERCHOLESTEROLEMIA 02/21/2007  . Essential hypertension 02/21/2007  . DEGENERATIVE JOINT DISEASE, CERVICAL SPINE 02/21/2007    Past Surgical History:  Procedure Laterality Date  . BROW LIFT Bilateral 10/23/2016   Procedure: BLEPHAROPLASTY upper eyelid with excess skin;  Surgeon: AmKarle StarchMD;  [facility] Service: Ophthalmology;  Laterality: Bilateral;  MAC  . CARDIOVERSION N/A 03/18/2018   Procedure: CARDIOVERSION;  Surgeon: Nelva Bush, MD;  Location: [facility];  Service: Cardiovascular;  Laterality: N/A;  . CARDIOVERSION N/A 05/06/2018   Procedure: CARDIOVERSION;  Surgeon: Nelva Bush, MD;  Location: [facility];  Service: Cardiovascular;  Laterality: N/A;  . CATARACT EXTRACTION  1986   OD  . CATARACT EXTRACTION Bilateral 1995   x 2 for right and left   . COLONOSCOPY    . CORONARY STENT INTERVENTION N/A 05/13/2018    Procedure: CORONARY STENT INTERVENTION;  Surgeon: Nelva Bush, MD;  Location: [facility];  Service: Cardiovascular;  Laterality: N/A;  . ECTROPION REPAIR Bilateral 10/23/2016   Procedure: REPAIR OF ECTROPION sutures, extensive;  Surgeon: Karle Starch, MD;  Location: [place];  Service: Ophthalmology;  Laterality: Bilateral;  . LIPOMA EXCISION  06/1997   left neck ([place])  . MOHS SURGERY Right 2013   behind right ear  . RIGHT/LEFT HEART CATH AND CORONARY ANGIOGRAPHY N/A 05/12/2018   Procedure: RIGHT/LEFT HEART CATH AND CORONARY ANGIOGRAPHY;  Surgeon: Wellington Hampshire, MD;  Location: [place];  Service: Cardiovascular;  Laterality: N/A;  . TONSILLECTOMY    . VASECTOMY  1982       Home Medications    Prior to Admission medications   Medication Sig Start Date End Date Taking? Authorizing Provider  amiodarone (PACERONE) 200 MG tablet TAKE 1 TABLET BY MOUTH EVERY DAY 03/09/19   End, Harrell Gave, MD  atorvastatin (LIPITOR) 80 MG tablet TAKE 1 TABLET(80 MG) BY MOUTH DAILY 02/24/19   End, Harrell Gave, MD  DHA-EPA-Coenzyme Q10-Vitamin E (CO Q-10 VITAMIN E FISH OIL PO) Take 1 capsule by mouth daily.    [provider]  diclofenac sodium (VOLTAREN) 1 % GEL Apply 4 g topically 4 (four) times daily as needed. Patient taking differently: Apply 4 g topically 4 (four) times daily as needed (knee pain.).  08/05/17   Tonia Ghent, MD  doxazosin (CARDURA) 4 MG tablet Take 1 tablet (4 mg total) by mouth daily after breakfast. 05/07/18   Billey Co, MD  ELIQUIS 5 MG TABS tablet TAKE 1 TABLET(5 MG) BY MOUTH TWICE DAILY 06/08/19   Tonia Ghent, MD  ezetimibe (ZETIA) 10 MG tablet Take 1 tablet (10 mg total) by mouth daily. 06/08/19   End, Harrell Gave, MD  finasteride (PROSCAR) 5 MG tablet Take 1 tablet (5 mg total) by mouth daily. 02/25/19   Billey Co, MD  furosemide (LASIX) 20 MG tablet Take 1 tablet (20 mg total) by mouth daily. 02/26/19   End,  Harrell Gave, MD  levothyroxine (SYNTHROID) 150 MCG tablet TAKE 1 TABLET BY MOUTH A DAY EXCEPT ONE-HALF TABLET ON SUNDAYS. TOTAL OF 6 AND ONE-HALF TABLETS PER WEEK 05/08/19   Tonia Ghent, MD  Multiple Vitamin (MULTIVITAMIN WITH MINERALS) TABS tablet Take 1 tablet by mouth daily. Senior Multivitamin    [provider]  psyllium (METAMUCIL) 58.6 % powder Take 1 packet by mouth daily.     [provider]  ramipril (ALTACE) 10 MG capsule Take 1 capsule (10 mg total) by mouth daily. 09/17/18   Tonia Ghent, MD    Family History Family History  Problem Relation Age of Onset  . Heart disease Paternal Grandfather        MI, old age  . Stroke Mother   . Lung cancer Maternal Grandfather        smoker  . Stroke Maternal Grandfather   .  Colon cancer Neg Hx   . Colon polyps Neg Hx   . Esophageal cancer Neg Hx   . Rectal cancer Neg Hx   . Stomach cancer Neg Hx   . Bladder Cancer Neg Hx   . Prostate cancer Neg Hx     Social History Social History   Tobacco Use  . Smoking status: Former Smoker    Packs/day: 2.00    Years: 20.00    Pack years: 40.00    Types: Cigarettes    Quit date: 08/07/1983    Years since quitting: 35.8  . Smokeless tobacco: Never Used  . Tobacco comment: quit over 30 years ago  Substance Use Topics  . Alcohol use: Yes    Alcohol/week: 1.0 standard drinks    Types: 1 Glasses of wine per week    Comment: per day  . Drug use: No     Allergies   Sulfonamide derivatives   Review of Systems Review of Systems   Physical Exam Triage Vital Signs ED Triage Vitals  Enc Vitals Group     BP 06/09/19 1635 (!) 151/84     Pulse Rate 06/09/19 1635 93     Resp 06/09/19 1635 18     Temp 06/09/19 1635 98.9 F (37.2 C)     Temp Source 06/09/19 1635 Oral     SpO2 06/09/19 1635 96 %     Weight 06/09/19 1634 255 lb (115.7 kg)     Height --      Head Circumference --      Peak Flow --      Pain Score 06/09/19 1633 0     Pain Loc --      Pain  Edu? --      Excl. in [place]? --    No data found.  Updated Vital Signs BP (!) 151/84   Pulse 93   Temp 98.9 F (37.2 C) (Oral)   Resp 18   Wt 255 lb (115.7 kg)   SpO2 96%   BMI 34.58 kg/m   Visual Acuity Right Eye Distance:   Left Eye Distance:   Bilateral Distance:    Right Eye Near:   Left Eye Near:    Bilateral Near:     Physical Exam Vitals signs and nursing note reviewed.  Constitutional:      Appearance: Normal appearance.  HENT:     Head: Normocephalic and atraumatic.     Nose: Nose normal.  Eyes:     Conjunctiva/sclera: Conjunctivae normal.  Neck:     Musculoskeletal: Normal range of motion.  Pulmonary:     Effort: Pulmonary effort is normal.  Musculoskeletal: Normal range of motion.  Skin:    General: Skin is warm and dry.     Comments: Approximated 1 cm superficial laceration to left distal pointer finger.  Slow bleeding.  Sensation intact  Neurological:     Mental Status: He is alert.  Psychiatric:        Mood and Affect: Mood normal.      UC Treatments / Results  Labs (all labs ordered are listed, but only abnormal results are displayed) Labs Reviewed - No data to display  EKG   Radiology No results found.  Procedures Laceration Repair  Date/Time: 06/09/2019 5:39 PM Performed by: Orvan July, NP Authorized by: Orvan July, NP   Consent:    Consent obtained:  Verbal   Consent given by:  Patient   Risks discussed:  Infection and pain   Alternatives  discussed:  No treatment Anesthesia (see MAR for exact dosages):    Anesthesia method:  None Laceration details:    Location:  Finger   Finger location:  L index finger   Length (cm):  1 Repair type:    Repair type:  Simple Exploration:    Hemostasis achieved with:  Direct pressure   Contaminated: no   Treatment:    Area cleansed with:  Soap and water   Amount of cleaning:  Standard   Visualized foreign bodies/material removed: no   Skin repair:    Repair method:  Tissue  adhesive Approximation:    Approximation:  Close Post-procedure details:    Dressing:  Non-adherent dressing   Patient tolerance of procedure:  Tolerated well, no immediate complications   (including critical care time)  Medications Ordered in UC Medications - No data to display  Initial Impression / Assessment and Plan / UC Course  I have reviewed the triage vital signs and the nursing notes.  Pertinent labs & imaging results that were available during my care of the patient were reviewed by me and considered in my medical decision making (see chart for details).     Laceration- superficial with slow bleeding, closed and bleeding controlled with derma bond. Pt tolerated well.  Non stick dressing applied to leave on for 24 hours.  Instructions given Tetanus up to date per pt.  Follow up as needed for continued or worsening symptoms  Final Clinical Impressions(s) / UC Diagnoses   Final diagnoses:  Laceration of left index finger, foreign body presence unspecified, nail damage status unspecified, initial encounter     Discharge Instructions     We put glue adhesive on the wound.  Keep the dressing on for 24 hours. Wait till the glue falls off.  Do not submerge in water.  It is okay to get it wet after 24 hours. Follow up as needed for continued or worsening symptoms     ED Prescriptions    None     PDMP not reviewed this encounter.   Orvan July, NP 06/09/19 1740

## 2019-06-09 NOTE — Telephone Encounter (Signed)
Notes recorded by [name], PA-C on 06/09/2019 at 3:15 PM EST  Results discussed with primary cardiologist. Given his renal function continues to climb after his scan, and despite increased hydration, recommend he stop taking his furosemide/diuretic 20mg  daily at this time and repeat BMET in 1 week. Further recommendations pending his repeat labs, at which time I will also reach out to his PCP.

## 2019-06-09 NOTE — Telephone Encounter (Signed)
-----   Message from [name], PA-C sent at 06/02/2019  7:58 PM EDT ----- Please let patient know that his imaging showed stable aneurysmal disease of the thoracic aorta with aortic root dilation.  His aortic root measures approximately 4.1 to 4.3 cm with ascending thoracic aorta 4.5 to 4.6 cm (improved from previously noted 4.7cm on last CTA) and proximal arch dilation 4.3 cm.  No evidence of aortic dissection. Imaging also showed his (known) CAD and a small hiatal hernia.    Semiannual (every 6 months) imaging with CTA or MRA recommended with preference for CTA for ease of comparison.  Results were reviewed with another provider in the office and referral to cardiothoracic surgery is not indicated at this time and can be reassessed at follow-up imaging.   He should avoid fluoroquinolones and maintain optimal control of BP and lipids/cholesterol.  Avoid lifting over 30 pounds.  No heavy digging or shoveling of snow. As indicated in other result note, hydration is encouraged after imaging/contrast exposure to prevent renal injury.  We will obtain another BMET in 1 week.

## 2019-06-09 NOTE — Discharge Instructions (Addendum)
We put glue adhesive on the wound.  Keep the dressing on for 24 hours. Wait till the glue falls off.  Do not submerge in water.  It is okay to get it wet after 24 hours. Follow up as needed for continued or worsening symptoms

## 2019-06-09 NOTE — Telephone Encounter (Signed)
I spoke with the patient regarding his BMP results. He is aware of James Mood, PA's recommendations to:  1) stop taking furosemide 20 mg at this time 2) repeat a BMP in 1 week (to be done at the [facility]- order placed)  He is aware that further recommendations regarding his meds will be made pending the results of his BMP next week.   The patient voices understanding of the above and is agreeable.

## 2019-06-09 NOTE — ED Triage Notes (Signed)
Patient in office today for laceration to left pointer finger 20 mins with box cutter  [id number]

## 2019-06-09 NOTE — Telephone Encounter (Signed)
Call to patient to discuss results from CT.   No new orders at this time.   Went this morning for lab redraw.   Pt verbalized understanding of results and had no further questions at this time.

## 2019-06-14 ENCOUNTER — Other Ambulatory Visit: Payer: Self-pay | Admitting: Family Medicine

## 2019-06-14 DIAGNOSIS — I10 Essential (primary) hypertension: Secondary | ICD-10-CM

## 2019-06-14 DIAGNOSIS — E039 Hypothyroidism, unspecified: Secondary | ICD-10-CM

## 2019-06-14 DIAGNOSIS — I4891 Unspecified atrial fibrillation: Secondary | ICD-10-CM

## 2019-06-18 ENCOUNTER — Other Ambulatory Visit (INDEPENDENT_AMBULATORY_CARE_PROVIDER_SITE_OTHER): Payer: Medicare Other

## 2019-06-18 ENCOUNTER — Other Ambulatory Visit: Payer: Self-pay

## 2019-06-18 DIAGNOSIS — I4891 Unspecified atrial fibrillation: Secondary | ICD-10-CM | POA: Diagnosis not present

## 2019-06-18 DIAGNOSIS — E039 Hypothyroidism, unspecified: Secondary | ICD-10-CM | POA: Diagnosis not present

## 2019-06-18 DIAGNOSIS — I10 Essential (primary) hypertension: Secondary | ICD-10-CM

## 2019-06-18 LAB — CBC WITH DIFFERENTIAL/PLATELET
Basophils Absolute: 0 10*3/uL (ref 0.0–0.1)
Basophils Relative: 0.4 % (ref 0.0–3.0)
Eosinophils Absolute: 0.1 10*3/uL (ref 0.0–0.7)
Eosinophils Relative: 1.8 % (ref 0.0–5.0)
HCT: 40.4 % (ref 39.0–52.0)
Hemoglobin: 13.6 g/dL (ref 13.0–17.0)
Lymphocytes Relative: 43.4 % (ref 12.0–46.0)
Lymphs Abs: 3.3 10*3/uL (ref 0.7–4.0)
MCHC: 33.5 g/dL (ref 30.0–36.0)
MCV: 94.2 fl (ref 78.0–100.0)
Monocytes Absolute: 0.5 10*3/uL (ref 0.1–1.0)
Monocytes Relative: 6.5 % (ref 3.0–12.0)
Neutro Abs: 3.6 10*3/uL (ref 1.4–7.7)
Neutrophils Relative %: 47.9 % (ref 43.0–77.0)
Platelets: 150 10*3/uL (ref 150.0–400.0)
RBC: 4.29 Mil/uL (ref 4.22–5.81)
RDW: 13.9 % (ref 11.5–15.5)
WBC: 7.5 10*3/uL (ref 4.0–10.5)

## 2019-06-18 LAB — COMPREHENSIVE METABOLIC PANEL
ALT: 14 U/L (ref 0–53)
AST: 17 U/L (ref 0–37)
Albumin: 3.7 g/dL (ref 3.5–5.2)
Alkaline Phosphatase: 53 U/L (ref 39–117)
BUN: 15 mg/dL (ref 6–23)
CO2: 28 mEq/L (ref 19–32)
Calcium: 9.1 mg/dL (ref 8.4–10.5)
Chloride: 106 mEq/L (ref 96–112)
Creatinine, Ser: 1.51 mg/dL — ABNORMAL HIGH (ref 0.40–1.50)
GFR: 45.67 mL/min — ABNORMAL LOW (ref >60.00)
Glucose, Bld: 86 mg/dL (ref 70–99)
Potassium: 4.3 mEq/L (ref 3.5–5.1)
Sodium: 140 mEq/L (ref 135–145)
Total Bilirubin: 1 mg/dL (ref 0.2–1.2)
Total Protein: 5.8 g/dL — ABNORMAL LOW (ref 6.0–8.3)

## 2019-06-18 LAB — LIPID PANEL
Cholesterol: 115 mg/dL (ref 0–200)
HDL: 43.4 mg/dL (ref >39.00)
LDL Cholesterol: 55 mg/dL (ref 0–99)
NonHDL: 71.54
Total CHOL/HDL Ratio: 3
Triglycerides: 85 mg/dL (ref 0.0–149.0)
VLDL: 17 mg/dL (ref 0.0–40.0)

## 2019-06-18 LAB — TSH: TSH: 10.56 u[IU]/mL — ABNORMAL HIGH (ref 0.35–4.50)

## 2019-06-19 ENCOUNTER — Other Ambulatory Visit: Payer: Self-pay

## 2019-06-19 ENCOUNTER — Ambulatory Visit (INDEPENDENT_AMBULATORY_CARE_PROVIDER_SITE_OTHER): Payer: Medicare Other

## 2019-06-19 DIAGNOSIS — I5032 Chronic diastolic (congestive) heart failure: Secondary | ICD-10-CM | POA: Diagnosis not present

## 2019-06-19 DIAGNOSIS — Z79899 Other long term (current) drug therapy: Secondary | ICD-10-CM | POA: Diagnosis not present

## 2019-06-19 MED ORDER — PERFLUTREN LIPID MICROSPHERE
1.0000 mL | INTRAVENOUS | Status: AC | PRN
  Administered 2019-06-19: 2 mL via INTRAVENOUS

## 2019-06-21 ENCOUNTER — Other Ambulatory Visit: Payer: Self-pay | Admitting: Family Medicine

## 2019-06-21 DIAGNOSIS — E039 Hypothyroidism, unspecified: Secondary | ICD-10-CM

## 2019-06-21 DIAGNOSIS — I10 Essential (primary) hypertension: Secondary | ICD-10-CM

## 2019-06-21 MED ORDER — LEVOTHYROXINE SODIUM 150 MCG PO TABS: 150.0000 ug | ORAL_TABLET | Freq: Every day | ORAL | Status: DC

## 2019-06-23 ENCOUNTER — Other Ambulatory Visit
Admission: RE | Admit: 2019-06-23 | Discharge: 2019-06-23 | Disposition: A | Discharge status: Home / Self Care | Payer: Medicare Other | Source: Ambulatory Visit | Attending: Physician Assistant | Admitting: Physician Assistant

## 2019-06-23 DIAGNOSIS — N289 Disorder of kidney and ureter, unspecified: Secondary | ICD-10-CM | POA: Insufficient documentation

## 2019-06-23 LAB — BASIC METABOLIC PANEL
Anion gap: 10 (ref 5–15)
BUN: 16 mg/dL (ref 8–23)
CO2: 23 mmol/L (ref 22–32)
Calcium: 9.3 mg/dL (ref 8.9–10.3)
Chloride: 105 mmol/L (ref 98–111)
Creatinine, Ser: 1.44 mg/dL — ABNORMAL HIGH (ref 0.61–1.24)
GFR calc Af Amer: 56 mL/min — ABNORMAL LOW (ref >60)
GFR calc non Af Amer: 49 mL/min — ABNORMAL LOW (ref >60)
Glucose, Bld: 90 mg/dL (ref 70–99)
Potassium: 4.2 mmol/L (ref 3.5–5.1)
Sodium: 138 mmol/L (ref 135–145)

## 2019-06-26 ENCOUNTER — Telehealth: Payer: Self-pay

## 2019-06-26 DIAGNOSIS — N289 Disorder of kidney and ureter, unspecified: Secondary | ICD-10-CM

## 2019-06-26 MED ORDER — FUROSEMIDE 20 MG PO TABS: ORAL_TABLET | ORAL | 2 refills | Status: DC

## 2019-06-26 NOTE — Telephone Encounter (Signed)
Call to patient to make him aware of lab results. Rx updated and order placed for [facility] blood redraw in 1week.   Pt verbalized understanding and is cooperative w POC.   Advised pt to call for any further questions or concerns.

## 2019-06-26 NOTE — Telephone Encounter (Signed)
-----   Message from [name], [place] sent at 06/26/2019  2:32 PM EST ----- It looks like [name] renal function is improving with holding his lasix after his contrast scan. He is nearing his baseline renal function; however, he would benefit from having his PCP monitor his renal function closely as well.   I will include his PCP on this result note, so that he can also keep an eye on James [name] renal function and volume status.    Please have him restart his lasix 20mg  daily on an every other day basis. We will check another BMET in a week.

## 2019-06-29 ENCOUNTER — Telehealth: Payer: Self-pay

## 2019-06-29 NOTE — Telephone Encounter (Signed)
Spoke with pharmacist who states pharmacy received a prescription on 06/26/2019 for furosemide 20mg  take one tablet once daily, every other day. Pharmacist requested verification on instructions. Advised pharmacist that patient is to take one tablet QOD. She also changed quantity to #45 tablets for a 90 day supply.

## 2019-07-03 ENCOUNTER — Other Ambulatory Visit
Admission: RE | Admit: 2019-07-03 | Discharge: 2019-07-03 | Disposition: A | Discharge status: Home / Self Care | Payer: Medicare Other | Source: Ambulatory Visit | Attending: Physician Assistant | Admitting: Physician Assistant

## 2019-07-03 DIAGNOSIS — N289 Disorder of kidney and ureter, unspecified: Secondary | ICD-10-CM

## 2019-07-03 LAB — BASIC METABOLIC PANEL
Anion gap: 8 (ref 5–15)
BUN: 20 mg/dL (ref 8–23)
CO2: 24 mmol/L (ref 22–32)
Calcium: 8.7 mg/dL — ABNORMAL LOW (ref 8.9–10.3)
Chloride: 108 mmol/L (ref 98–111)
Creatinine, Ser: 1.27 mg/dL — ABNORMAL HIGH (ref 0.61–1.24)
GFR calc Af Amer: >60 mL/min (ref >60)
GFR calc non Af Amer: 56 mL/min — ABNORMAL LOW (ref >60)
Glucose, Bld: 85 mg/dL (ref 70–99)
Potassium: 4.3 mmol/L (ref 3.5–5.1)
Sodium: 140 mmol/L (ref 135–145)

## 2019-07-06 ENCOUNTER — Telehealth: Payer: Self-pay | Admitting: *Deleted

## 2019-07-06 MED ORDER — FUROSEMIDE 20 MG PO TABS: ORAL_TABLET | ORAL | 2 refills | Status: DC

## 2019-07-06 NOTE — Telephone Encounter (Signed)
-----   Message from [name], [place] sent at 07/06/2019  8:02 AM EST ----- Please have Mr. Nishiyama return to his usual lasix 20mg  daily, as his renal function has improved to baseline. I will copy Dr. Damita Dunnings on his follow-up BMET results for continued monitoring of his renal function as requested.

## 2019-07-06 NOTE — Telephone Encounter (Signed)
Results called to pt. Pt verbalized understanding of results and to resume lasix 20 mg by mouth once a day. He is aware to follow up with Dr Damita Dunnings for renal function. Med list updated.

## 2019-07-16 ENCOUNTER — Telehealth: Payer: Self-pay | Admitting: Urology

## 2019-07-16 ENCOUNTER — Other Ambulatory Visit: Payer: Self-pay

## 2019-07-16 NOTE — Telephone Encounter (Signed)
Pt needs refill sent to [facility] for Doxazosin 4 mg.    Phone# [phone] Fax# [phone]

## 2019-07-16 NOTE — Telephone Encounter (Signed)
Last note said he will be following up with his pcp for the medication refills. Left message on his phone  to let him know .

## 2019-07-16 NOTE — Telephone Encounter (Signed)
Pt left v/m that urologist has been prescribing doxazosin 4mg  and now for refills on this med urologist advised to contact PCP. Pt las seen annual 08/08/18.pt request # 90 x 3 to [facility] . Pt request cb.

## 2019-07-17 MED ORDER — DOXAZOSIN MESYLATE 4 MG PO TABS: 4.0000 mg | ORAL_TABLET | Freq: Every day | ORAL | 3 refills | Status: DC

## 2019-07-17 NOTE — Telephone Encounter (Signed)
Sent. Thanks.   

## 2019-07-27 ENCOUNTER — Other Ambulatory Visit: Payer: Self-pay | Admitting: *Deleted

## 2019-07-27 NOTE — Telephone Encounter (Signed)
Faxed refill request. Levothyroxine Last office visit:   08/08/2018 Last Filled:   05/08/2019 for 85 tabs Please advise.

## 2019-07-28 MED ORDER — LEVOTHYROXINE SODIUM 150 MCG PO TABS: 150.0000 ug | ORAL_TABLET | Freq: Every day | ORAL | 1 refills | Status: DC

## 2019-07-28 NOTE — Telephone Encounter (Signed)
Sent. Thanks.   

## 2019-08-10 ENCOUNTER — Other Ambulatory Visit (INDEPENDENT_AMBULATORY_CARE_PROVIDER_SITE_OTHER): Payer: Medicare Other

## 2019-08-10 ENCOUNTER — Other Ambulatory Visit: Payer: Self-pay

## 2019-08-10 DIAGNOSIS — I10 Essential (primary) hypertension: Secondary | ICD-10-CM | POA: Diagnosis not present

## 2019-08-10 DIAGNOSIS — E039 Hypothyroidism, unspecified: Secondary | ICD-10-CM

## 2019-08-10 LAB — BASIC METABOLIC PANEL
BUN: 16 mg/dL (ref 6–23)
CO2: 26 mEq/L (ref 19–32)
Calcium: 9 mg/dL (ref 8.4–10.5)
Chloride: 104 mEq/L (ref 96–112)
Creatinine, Ser: 1.43 mg/dL (ref 0.40–1.50)
GFR: 48.61 mL/min — ABNORMAL LOW (ref >60.00)
Glucose, Bld: 87 mg/dL (ref 70–99)
Potassium: 4.4 mEq/L (ref 3.5–5.1)
Sodium: 139 mEq/L (ref 135–145)

## 2019-08-10 LAB — TSH: TSH: 1.43 u[IU]/mL (ref 0.35–4.50)

## 2019-08-11 ENCOUNTER — Ambulatory Visit (INDEPENDENT_AMBULATORY_CARE_PROVIDER_SITE_OTHER): Payer: Medicare Other

## 2019-08-11 ENCOUNTER — Other Ambulatory Visit: Payer: Self-pay

## 2019-08-11 DIAGNOSIS — Z0000 Encounter for general adult medical examination without abnormal findings: Secondary | ICD-10-CM

## 2019-08-11 NOTE — Progress Notes (Signed)
PCP notes:  Health Maintenance: No gaps noted   Abnormal Screenings: none   Patient concerns: none   Nurse concerns: none   Next PCP appt.: 08/17/2019 @ 8:30 am

## 2019-08-11 NOTE — Patient Instructions (Signed)
James [name] , Thank you for taking time to come for your Medicare Wellness Visit. I appreciate your ongoing commitment to your health goals. Please review the following plan we discussed and let me know if I can assist you in the future.   Screening recommendations/referrals: Colonoscopy: Up to date, completed 08/21/2016 Recommended yearly ophthalmology/optometry visit for glaucoma screening and checkup Recommended yearly dental visit for hygiene and checkup  Vaccinations: Influenza vaccine: Up to date, completed 05/07/2019 Pneumococcal vaccine: Completed series Tdap vaccine: decline Shingles vaccine: #1 completed 06/11/2019    Advanced directives: copy in chart  Conditions/risks identified: hypertension, hyperlipidemia   Next appointment: 08/17/2019 @ 8:30 am   Preventive Care 73 Years and Older, Male Preventive care refers to lifestyle choices and visits with your health care provider that can promote health and wellness. What does preventive care include?  A yearly physical exam. This is also called an annual well check.  Dental exams once or twice a year.  Routine eye exams. Ask your health care provider how often you should have your eyes checked.  Personal lifestyle choices, including:  Daily care of your teeth and gums.  Regular physical activity.  Eating a healthy diet.  Avoiding tobacco and drug use.  Limiting alcohol use.  Practicing safe sex.  Taking low doses of aspirin every day.  Taking vitamin and mineral supplements as recommended by your health care provider. What happens during an annual well check? The services and screenings done by your health care provider during your annual well check will depend on your age, overall health, lifestyle risk factors, and family history of disease. Counseling  Your health care provider may ask you questions about your:  Alcohol use.  Tobacco use.  Drug use.  Emotional well-being.  Home and relationship  well-being.  Sexual activity.  Eating habits.  History of falls.  Memory and ability to understand (cognition).  Work and work Statistician. Screening  You may have the following tests or measurements:  Height, weight, and BMI.  Blood pressure.  Lipid and cholesterol levels. These may be checked every 5 years, or more frequently if you are over 69 years old.  Skin check.  Lung cancer screening. You may have this screening every year starting at age 1 if you have a 30-pack-year history of smoking and currently smoke or have quit within the past 15 years.  Fecal occult blood test (FOBT) of the stool. You may have this test every year starting at age 67.  Flexible sigmoidoscopy or colonoscopy. You may have a sigmoidoscopy every 5 years or a colonoscopy every 10 years starting at age 86.  Prostate cancer screening. Recommendations will vary depending on your family history and other risks.  Hepatitis C blood test.  Hepatitis B blood test.  Sexually transmitted disease (STD) testing.  Diabetes screening. This is done by checking your blood sugar (glucose) after you have not eaten for a while (fasting). You may have this done every 1-3 years.  Abdominal aortic aneurysm (AAA) screening. You may need this if you are a current or former smoker.  Osteoporosis. You may be screened starting at age 20 if you are at high risk. Talk with your health care provider about your test results, treatment options, and if necessary, the need for more tests. Vaccines  Your health care provider may recommend certain vaccines, such as:  Influenza vaccine. This is recommended every year.  Tetanus, diphtheria, and acellular pertussis (Tdap, Td) vaccine. You may need a Td booster every 10  years.  Zoster vaccine. You may need this after age 32.  Pneumococcal 13-valent conjugate (PCV13) vaccine. One dose is recommended after age 27.  Pneumococcal polysaccharide (PPSV23) vaccine. One dose is  recommended after age 67. Talk to your health care provider about which screenings and vaccines you need and how often you need them. This information is not intended to replace advice given to you by your health care provider. Make sure you discuss any questions you have with your health care provider. Document Released: 08/19/2015 Document Revised: 04/11/2016 Document Reviewed: 05/24/2015 Elsevier Interactive Patient Education  2017 [place] Prevention in the Home Falls can cause injuries. They can happen to people of all ages. There are many things you can do to make your home safe and to help prevent falls. What can I do on the outside of my home?  Regularly fix the edges of walkways and driveways and fix any cracks.  Remove anything that might make you trip as you walk through a door, such as a raised step or threshold.  Trim any bushes or trees on the path to your home.  Use bright outdoor lighting.  Clear any walking paths of anything that might make someone trip, such as rocks or tools.  Regularly check to see if handrails are loose or broken. Make sure that both sides of any steps have handrails.  Any raised decks and porches should have guardrails on the edges.  Have any leaves, snow, or ice cleared regularly.  Use sand or salt on walking paths during winter.  Clean up any spills in your garage right away. This includes oil or grease spills. What can I do in the bathroom?  Use night lights.  Install grab bars by the toilet and in the tub and shower. Do not use towel bars as grab bars.  Use non-skid mats or decals in the tub or shower.  If you need to sit down in the shower, use a plastic, non-slip stool.  Keep the floor dry. Clean up any water that spills on the floor as soon as it happens.  Remove soap buildup in the tub or shower regularly.  Attach bath mats securely with double-sided non-slip rug tape.  Do not have throw rugs and other things on  the floor that can make you trip. What can I do in the bedroom?  Use night lights.  Make sure that you have a light by your bed that is easy to reach.  Do not use any sheets or blankets that are too big for your bed. They should not hang down onto the floor.  Have a firm chair that has side arms. You can use this for support while you get dressed.  Do not have throw rugs and other things on the floor that can make you trip. What can I do in the kitchen?  Clean up any spills right away.  Avoid walking on wet floors.  Keep items that you use a lot in easy-to-reach places.  If you need to reach something above you, use a strong step stool that has a grab bar.  Keep electrical cords out of the way.  Do not use floor polish or wax that makes floors slippery. If you must use wax, use non-skid floor wax.  Do not have throw rugs and other things on the floor that can make you trip. What can I do with my stairs?  Do not leave any items on the stairs.  Make sure that  there are handrails on both sides of the stairs and use them. Fix handrails that are broken or loose. Make sure that handrails are as long as the stairways.  Check any carpeting to make sure that it is firmly attached to the stairs. Fix any carpet that is loose or worn.  Avoid having throw rugs at the top or bottom of the stairs. If you do have throw rugs, attach them to the floor with carpet tape.  Make sure that you have a light switch at the top of the stairs and the bottom of the stairs. If you do not have them, ask someone to add them for you. What else can I do to help prevent falls?  Wear shoes that:  Do not have high heels.  Have rubber bottoms.  Are comfortable and fit you well.  Are closed at the toe. Do not wear sandals.  If you use a stepladder:  Make sure that it is fully opened. Do not climb a closed stepladder.  Make sure that both sides of the stepladder are locked into place.  Ask someone to  hold it for you, if possible.  Clearly mark and make sure that you can see:  Any grab bars or handrails.  First and last steps.  Where the edge of each step is.  Use tools that help you move around (mobility aids) if they are needed. These include:  Canes.  Walkers.  Scooters.  Crutches.  Turn on the lights when you go into a dark area. Replace any light bulbs as soon as they burn out.  Set up your furniture so you have a clear path. Avoid moving your furniture around.  If any of your floors are uneven, fix them.  If there are any pets around you, be aware of where they are.  Review your medicines with your doctor. Some medicines can make you feel dizzy. This can increase your chance of falling. Ask your doctor what other things that you can do to help prevent falls. This information is not intended to replace advice given to you by your health care provider. Make sure you discuss any questions you have with your health care provider. Document Released: 05/19/2009 Document Revised: 12/29/2015 Document Reviewed: 08/27/2014 Elsevier Interactive Patient Education  2017 [employer].

## 2019-08-11 NOTE — Progress Notes (Signed)
Subjective:   James [name] is a 74 y.o. male who presents for Medicare Annual/Subsequent preventive examination.  Review of Systems: N/A   This visit is being conducted through telemedicine via telephone at the nurse health advisor's home address due to the COVID-19 pandemic. This patient has given me verbal consent via doximity to conduct this visit, patient states they are participating from their home address. Patient and myself are on the telephone call. There is no referral for this visit. Some vital signs may be absent or patient reported.    Patient identification: identified by name, DOB, and current address   Cardiac Risk Factors include: advanced age (>41mn, >>46women);dyslipidemia;hypertension;male gender     Objective:    Vitals: There were no vitals taken for this visit.  There is no height or weight on file to calculate BMI.  Advanced Directives 08/11/2019 08/08/2018 05/13/2018 05/12/2018 05/09/2018 05/08/2018 05/06/2018  Does Patient Have a Medical Advance Directive? _0  Yes Yes  Type of AParamedicof AToa AltaLiving will H[place]oresvilleLiving will - Living will;Healthcare Power of AWoodlynLiving will HSawmillsLiving will HBankstonLiving will  Does patient want to make changes to medical advance directive? - - - No - Patient declined No - Patient declined No - Patient declined No - Patient declined  Copy of [place]Moorefield Stationin Chart? Yes - validated most recent copy scanned in chart (See row information) No - copy requested - No - copy requested No - copy requested No - copy requested No - copy requested    Tobacco Social History   Tobacco Use  Smoking Status Former Smoker  . Packs/day: 2.00  . Years: 20.00  . Pack years: 40.00  . Types: Cigarettes  . Quit date: 08/07/1983  . Years since quitting: 36.0  Smokeless Tobacco Never Used    Tobacco Comment   quit over 30 years ago     Counseling given: Not Answered Comment: quit over 30 years ago   Clinical Intake:  Pre-visit preparation completed: Yes  Pain : No/denies pain     Nutritional Risks: None Diabetes: No  How often do you need to have someone help you when you read instructions, pamphlets, or other written materials from your doctor or pharmacy?: 1 - Never What is the last grade level you completed in school?: 2 years college  Interpreter Needed?: No  Information entered by :: CJohnson, LPN  Past Medical History:  Diagnosis Date  . (HFpEF) heart failure with preserved ejection fraction ([place]    a. 05/2018 Echo: EF 55-60%, gr2 DD.  .Marland KitchenAcute lower GI bleeding after colonoscopy and polypectomy, resolved 12/12/2015  . Ascending aortic aneurysm ([place]    a. 02/2018 Echo: mildly dil Ao root/asc ao/arch; b. 05/2018 CTA Chest: 4.7cm fusiform aneurysm of Asc Ao.  .Marland KitchenCAD (coronary artery disease)    a. 05/2018 Cath/PCI: LM nl,LAD 30p, 90/99d/apical, LCX 331mOM1/2/3 min irregs, RCA 85p (3.5x18 [place]Mount [place]  . Cardiac murmur    a. 02/2018 Echo: EF 60-65%, no rwma, mild AI, mildly dil Ao root/Asc Ao/Arch, Mild MR, mildly dil LA. Nl RV fxn.  . Cataract    bilateral surgery to remove  . Colon polyp   . Constipation   . Essential hypertension   . Hemorrhoids   . Hepatitis    self resolved, likely food exposure, 1974.   . Marland Kitchenistory of cardiovascular stress test  a. 02/2018 Myoview: EF 55-65%, small/mild apical defect w/ nl wall motion ->attenuation artifact. No ischemia. Low risk study.  Marland Kitchen Hx of adenomatous colonic polyps 12/05/2015  . Hyperlipidemia   . Hypothyroidism   . Mitral regurgitation    a. 110/2019 Echo: EF 55-60%. Gr2 DD. Triv AI. Ao root 59m. Mild MR. Mod dil LA, mildly dil RA.  .Marland KitchenObstructive sleep apnea 05/23/2018  . Osteoarthritis   . Persistent atrial fibrillation ([place]    a. Dx 02/2018. CHA2DS2VASc = 2-->Eliquis; b. 03/2018 DCCV (200J); c. 03/2018  recurrent Afib-->flecainide started 04/2018;  d. 05/06/2018 s/p successful DCCV - 200J x 2-->on amio.  . Skin cancer    basal cell, R ear, MOHS   Past Surgical History:  Procedure Laterality Date  . BROW LIFT Bilateral 10/23/2016   Procedure: BLEPHAROPLASTY upper eyelid with excess skin;  Surgeon: AKarle Starch MD;  [facility]  Service: Ophthalmology;  Laterality: Bilateral;  MAC  . CARDIOVERSION N/A 03/18/2018   Procedure: CARDIOVERSION;  Surgeon: ENelva Bush MD;  [facility];  Service: Cardiovascular;  Laterality: N/A;  . CARDIOVERSION N/A 05/06/2018   Procedure: CARDIOVERSION;  Surgeon: ENelva Bush MD;  [facility];  Service: Cardiovascular;  Laterality: N/A;  . CATARACT EXTRACTION  1986   OD  . CATARACT EXTRACTION Bilateral 1995   x 2 for right and left   . COLONOSCOPY    . CORONARY STENT INTERVENTION N/A 05/13/2018   Procedure: CORONARY STENT INTERVENTION;  Surgeon: ENelva Bush MD;  [facility];  Service: Cardiovascular;  Laterality: N/A;  . ECTROPION REPAIR Bilateral 10/23/2016   Procedure: REPAIR OF ECTROPION sutures, extensive;  Surgeon: AKarle Starch MD;  [facility]  Service: Ophthalmology;  Laterality: Bilateral;  . LIPOMA EXCISION  06/1997   left neck ([place])  . MOHS SURGERY Right 2013   behind right ear  . RIGHT/LEFT HEART CATH AND CORONARY ANGIOGRAPHY N/A 05/12/2018   Procedure: RIGHT/LEFT HEART CATH AND CORONARY ANGIOGRAPHY;  Surgeon: AWellington Hampshire MD;  [facility];  Service: Cardiovascular;  Laterality: N/A;  . TONSILLECTOMY    . VASECTOMY  1982   Family History  Problem Relation Age of Onset  . Heart disease Paternal Grandfather        MI, old age  . Stroke Mother   . Lung cancer Maternal Grandfather        smoker  . Stroke Maternal Grandfather   . Colon cancer Neg Hx   . Colon polyps Neg Hx   . Esophageal cancer Neg Hx   . Rectal cancer Neg Hx   .  Stomach cancer Neg Hx   . Bladder Cancer Neg Hx   . Prostate cancer Neg Hx    Social History   Socioeconomic History  . Marital status: Married    Spouse name: JRemo Lipps . Number of children: 1  . Years of education: 150 . Highest education level: Associate degree: occupational, tHotel manager or vocational program  Occupational History  . Occupation: Retired    EFish farm manager RETIRED    Comment: 1  Tobacco Use  . Smoking status: Former Smoker    Packs/day: 2.00    Years: 20.00    Pack years: 40.00    Types: Cigarettes    Quit date: 08/07/1983    Years since quitting: 36.0  . Smokeless tobacco: Never Used  . Tobacco comment: quit over 30 years ago  Substance and Sexual Activity  . Alcohol use: Yes  Alcohol/week: 1.0 standard drinks    Types: 1 Glasses of wine per week    Comment: per day  . Drug use: No  . Sexual activity: Not on file  Other Topics Concern  . Not on file  Social History Narrative   Married and lives with wife 1974   One daughter, not local   Retired from [employer], sea based telecommunication/contracting   Social Determinants of Health   Financial Resource Strain: [place]   . Difficulty of Paying Living Expenses: Not hard at all  Food Insecurity: No Food Insecurity  . Worried About Charity fundraiser in the Last Year: Never true  . Ran Out of Food in the Last Year: Never true  Transportation Needs: No Transportation Needs  . Lack of Transportation (Medical): No  . Lack of Transportation (Non-Medical): No  Physical Activity: Inactive  . Days of Exercise per Week: 0 days  . Minutes of Exercise per Session: 0 min  Stress: No Stress Concern Present  . Feeling of Stress : Not at all  Social Connections:   . Frequency of Communication with Friends and Family: Not on file  . Frequency of Social Gatherings with Friends and Family: Not on file  . Attends Religious Services: Not on file  . Active Member of Clubs or Organizations: Not on file  . Attends Theatre manager Meetings: Not on file  . Marital Status: Not on file    Outpatient Encounter Medications as of 08/11/2019  Medication Sig  . amiodarone (PACERONE) 200 MG tablet TAKE 1 TABLET BY MOUTH EVERY DAY  . atorvastatin (LIPITOR) 80 MG tablet TAKE 1 TABLET(80 MG) BY MOUTH DAILY  . DHA-EPA-Coenzyme Q10-Vitamin E (CO Q-10 VITAMIN E FISH OIL PO) Take 1 capsule by mouth daily.  . diclofenac sodium (VOLTAREN) 1 % GEL Apply 4 g topically 4 (four) times daily as needed. (Patient taking differently: Apply 4 g topically 4 (four) times daily as needed (knee pain.). )  . doxazosin (CARDURA) 4 MG tablet Take 1 tablet (4 mg total) by mouth daily after breakfast.  . ELIQUIS 5 MG TABS tablet TAKE 1 TABLET(5 MG) BY MOUTH TWICE DAILY  . ezetimibe (ZETIA) 10 MG tablet Take 1 tablet (10 mg total) by mouth daily.  . finasteride (PROSCAR) 5 MG tablet Take 1 tablet (5 mg total) by mouth daily.  . furosemide (LASIX) 20 MG tablet Take 1 tablet (20 mg total) by mouth once a day.  . levothyroxine (SYNTHROID) 150 MCG tablet Take 1 tablet (150 mcg total) by mouth daily before breakfast.  . Multiple Vitamin (MULTIVITAMIN WITH MINERALS) TABS tablet Take 1 tablet by mouth daily. Senior Multivitamin  . psyllium (METAMUCIL) 58.6 % powder Take 1 packet by mouth daily.   . ramipril (ALTACE) 10 MG capsule Take 1 capsule (10 mg total) by mouth daily.   No facility-administered encounter medications on file as of 08/11/2019.    Activities of Daily Living In your present state of health, do you have any difficulty performing the following activities: 08/11/2019 05/07/2019  Hearing? N N  Vision? N N  Difficulty concentrating or making decisions? N N  Walking or climbing stairs? N N  Dressing or bathing? N N  Doing errands, shopping? N N  Preparing Food and eating ? N -  Using the Toilet? N -  In the past six months, have you accidently leaked urine? N -  Do you have problems with loss of bowel control? N -  Managing your  Medications? N -  Managing your Finances? N -  Housekeeping or managing your Housekeeping? N -  Some recent data might be hidden    Patient Care Team: Tonia Ghent, MD as PCP - General (Family Medicine) End, Harrell Gave, MD as PCP - Cardiology (Cardiology)   Assessment:   This is a routine wellness examination for [facility].  Exercise Activities and Dietary recommendations Current Exercise Habits: The patient does not participate in regular exercise at present, Exercise limited by: None identified  Goals    . Increase physical activity     Starting 08/08/18, I will continue to exercise for 30 minutes daily.     . Patient Stated     08/11/2019, I will increase my physical activity and exercise more.        Fall Risk Fall Risk  08/11/2019 05/07/2019 08/08/2018 07/07/2018 05/22/2018  Falls in the past year? 0 0 0 0 No  Number falls in past yr: 0 0 - 0 -  Injury with Fall? 0 0 - 0 -  Risk for fall due to : Medication side effect - - - -  Follow up Falls evaluation completed;Falls prevention discussed - - - -   Is the patient's home free of loose throw rugs in walkways, pet beds, electrical cords, etc?   yes      Grab bars in the bathroom? yes      Handrails on the stairs?   yes      Adequate lighting?   yes  Timed Get Up and Go Performed: N/A  Depression Screen PHQ 2/9 Scores 08/11/2019 08/08/2018 05/22/2018 07/25/2017  PHQ - 2 Score 0 0 0 0  PHQ- 9 Score 0 0 - 0    Cognitive Function MMSE - Mini Mental State Exam 08/11/2019 08/08/2018 07/25/2017  Orientation to time _0 Orientation to Place _1 Registration _2 Attention/ Calculation 5 0 0  Recall _3 Language- name 2 objects - 0 0  Language- repeat _4 Language- follow 3 step command - 3 3  Language- read & follow direction - 0 0  Write a sentence - 0 0  Copy design - 0 0  Total score - 20 20  Mini Cog  Mini-Cog screen was completed. Maximum score is 22. A value of 0 denotes this part of the MMSE was not  completed or the patient failed this part of the Mini-Cog screening.       Immunization History  Administered Date(s) Administered  . Fluad Quad(high Dose 65+) 05/07/2019  . Influenza, High Dose Seasonal PF 04/14/2017  . Influenza,inj,Quad PF,6+ Mos 04/16/2018  . Influenza-Unspecified 05/06/2013, 04/20/2014, 05/07/2015, 04/06/2016, 04/14/2017, 04/16/2018  . Pneumococcal Conjugate-13 07/20/2014  . Pneumococcal Polysaccharide-23 07/16/2013  . Td 02/19/2008  . Zoster 09/30/2007  . Zoster Recombinat (Shingrix) 06/11/2019    Qualifies for Shingles Vaccine? #1 06/11/2019  Screening Tests Health Maintenance  Topic Date Due  . TETANUS/TDAP  08/08/2048 (Originally 02/18/2018)  . COLONOSCOPY  08/22/2019  . INFLUENZA VACCINE  Completed  . Hepatitis C Screening  Completed  . PNA vac Low Risk Adult  Completed   Cancer Screenings: Lung: Low Dose CT Chest recommended if Age 50-80 years, 30 pack-year currently smoking OR have quit w/in 15years. Patient does not qualify. Colorectal: completed 08/21/2016  Additional Screenings:  Hepatitis C Screening: 10/20/2015      Plan:    Patient will increase physical activity and exercise more.   I  have personally reviewed and noted the following in the patient's chart:   . Medical and social history . Use of alcohol, tobacco or illicit drugs  . Current medications and supplements . Functional ability and status . Nutritional status . Physical activity . Advanced directives . List of other physicians . Hospitalizations, surgeries, and ER visits in previous 12 months . Vitals . Screenings to include cognitive, depression, and falls . Referrals and appointments  In addition, I have reviewed and discussed with patient certain preventive protocols, quality metrics, and best practice recommendations. A written personalized care plan for preventive services as well as general preventive health recommendations were provided to patient.      Andrez Grime, LPN  12/09/7012

## 2019-08-17 ENCOUNTER — Ambulatory Visit: Payer: Medicare Other

## 2019-08-17 ENCOUNTER — Ambulatory Visit (INDEPENDENT_AMBULATORY_CARE_PROVIDER_SITE_OTHER): Payer: Medicare Other | Admitting: Family Medicine

## 2019-08-17 ENCOUNTER — Other Ambulatory Visit: Payer: Self-pay

## 2019-08-17 ENCOUNTER — Encounter: Payer: Self-pay | Admitting: Family Medicine

## 2019-08-17 VITALS — BP 132/88 | HR 71 | Temp 96.5°F | Ht 71.5 in | Wt 264.4 lb

## 2019-08-17 DIAGNOSIS — I4819 Other persistent atrial fibrillation: Secondary | ICD-10-CM

## 2019-08-17 DIAGNOSIS — I10 Essential (primary) hypertension: Secondary | ICD-10-CM

## 2019-08-17 DIAGNOSIS — Z7189 Other specified counseling: Secondary | ICD-10-CM

## 2019-08-17 DIAGNOSIS — R351 Nocturia: Secondary | ICD-10-CM

## 2019-08-17 DIAGNOSIS — I4891 Unspecified atrial fibrillation: Secondary | ICD-10-CM

## 2019-08-17 DIAGNOSIS — E039 Hypothyroidism, unspecified: Secondary | ICD-10-CM

## 2019-08-17 DIAGNOSIS — N401 Enlarged prostate with lower urinary tract symptoms: Secondary | ICD-10-CM | POA: Diagnosis not present

## 2019-08-17 DIAGNOSIS — Z0000 Encounter for general adult medical examination without abnormal findings: Secondary | ICD-10-CM

## 2019-08-17 DIAGNOSIS — E7800 Pure hypercholesterolemia, unspecified: Secondary | ICD-10-CM | POA: Diagnosis not present

## 2019-08-17 MED ORDER — RAMIPRIL 10 MG PO CAPS: 10.0000 mg | ORAL_CAPSULE | Freq: Every day | ORAL | 3 refills | Status: DC

## 2019-08-17 MED ORDER — APIXABAN 5 MG PO TABS: ORAL_TABLET | ORAL | 3 refills | Status: DC

## 2019-08-17 MED ORDER — FINASTERIDE 5 MG PO TABS: 5.0000 mg | ORAL_TABLET | Freq: Every day | ORAL | 3 refills | Status: DC

## 2019-08-17 NOTE — Progress Notes (Signed)
This visit occurred during the SARS-CoV-2 public health emergency.  Safety protocols were in place, including screening questions prior to the visit, additional usage of staff PPE, and extensive cleaning of exam room while observing appropriate contact time as indicated for disinfecting solutions.  CAD   Using medication without problems or lightheadedness:  yes Chest pain with exertion:no Edema: no Short of breath: with higher level of exertion but this stable over the last few months.  He can still lay flat w/o SOB.  No wheeze.    LUTS controlled with current meds.  No ADE on med.   AF. Still anticoagulated. Not bleeding unless he has a cut/superficial neck on the skin.  He has some bruising.  Compliant.    Elevated Cholesterol: Using medications without problems: yes Muscle aches: no Diet compliance: "fair" Exercise: limited from knee pain.    Hypothyroidism.  Compliant.  TSH wnl.  No neck mass.    Shingrix d/w pt.   Flu 2020 Tetanus 2009. PNA up to date.  Prostate cancer screening and PSA options (with potential risks and benefits of testing vs not testing) were discussed along with recent recs/guidelines.  He declined testing PSA at this point. Colonoscopy up to date, due 2021.  D/w pt.   Wife would be designated if patient were incapacitated.    Diet and exercise d/w pt.   Memory d/w pt.  Recent normal testing.  He has occ been repeating himself.  He always has had trouble with names, this is at baseline.  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  No red flag events.  We talked about monitoring for now.  He likely doesn't have ominous changes.  He came in today with his own spreadsheet for his meds/doses.    Meds, vitals, and allergies reviewed.   PMH and SH reviewed ROS: Per HPI unless specifically indicated in ROS section   GEN: nad, alert and oriented HEENT: ncat NECK: supple w/o LA CV: He sounds to be RRR PULM: ctab, no  inc wob ABD: soft, +bs EXT: no edema SKIN: no acute rash

## 2019-08-17 NOTE — Patient Instructions (Addendum)
Check with your insurance to see if they will cover the tetanus shot at the pharmacy.  I'll await the update on the 2nd shingles shot.    [email or web address]  Let me know when you want to go see GI, if you need a referral.  You may be able to call them directly.  The GI clinic will likely call me about potentially holding the eliquis.    Don't change your meds for now and I'll update cardiology and we'll go from there. Take care.  Always glad to see you.

## 2019-08-19 DIAGNOSIS — Z0120 Encounter for dental examination and cleaning without abnormal findings: Secondary | ICD-10-CM | POA: Diagnosis not present

## 2019-08-19 NOTE — Assessment & Plan Note (Signed)
Reasonable control.  Continue as is.  He agrees.

## 2019-08-19 NOTE — Assessment & Plan Note (Signed)
He sounds to be in RRR today on exam.  Would continue anticoagulation for now.  He does have some shortness of breath with higher level of exertion but this is stable over the past few months.  He does not have symptoms of heart failure otherwise.  He does not have orthopnea.  He has no lower extremity edema.  This point still okay for outpatient follow-up but I will update cardiology.  I did not yet change his medications.  Discussed with patient.  He agrees.

## 2019-08-19 NOTE — Assessment & Plan Note (Signed)
Continue as is for now.  He agrees.  Labs discussed with patient.

## 2019-08-19 NOTE — Assessment & Plan Note (Signed)
Compliant.  TSH wnl.  No neck mass.   Continue as is.  He agrees.

## 2019-08-19 NOTE — Assessment & Plan Note (Signed)
LUTS controlled with current meds.  No ADE on med.  Continue as is.  He agrees.

## 2019-08-19 NOTE — Assessment & Plan Note (Addendum)
Shingrix d/w pt.   Flu 2020 Tetanus 2009. PNA up to date.  Prostate cancer screening and PSA options (with potential risks and benefits of testing vs not testing) were discussed along with recent recs/guidelines.  He declined testing PSA at this point. Colonoscopy up to date, due 2021.  D/w pt.   Wife would be designated if patient were incapacitated.    Diet and exercise d/w pt.   Memory d/w pt.  Recent normal testing.  He has occ been repeating himself.  He always has had trouble with names, this is at baseline.  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  No red flag events.  We talked about monitoring for now.  He likely doesn't have ominous changes.  He came in today with his own spreadsheet for his meds/doses.

## 2019-08-20 ENCOUNTER — Encounter: Payer: Self-pay | Admitting: Family Medicine

## 2019-08-21 ENCOUNTER — Ambulatory Visit: Payer: Medicare Other | Admitting: Internal Medicine

## 2019-08-21 ENCOUNTER — Telehealth: Payer: Self-pay | Admitting: Internal Medicine

## 2019-08-21 NOTE — Telephone Encounter (Signed)
Ok per End to see virtual sooner with him or APP.   Attempted to schedule. lmov

## 2019-08-21 NOTE — Telephone Encounter (Signed)

## 2019-08-24 NOTE — Progress Notes (Signed)
Virtual Visit via Telephone Note   This visit type was conducted due to national recommendations for restrictions regarding the COVID-19 Pandemic (e.g. social distancing) in an effort to limit this patient's exposure and mitigate transmission in our community.  Due to his co-morbid illnesses, this patient is at least at moderate risk for complications without adequate follow up.  This format is felt to be most appropriate for this patient at this time.  The patient did not have access to video technology/had technical difficulties with video requiring transitioning to audio format only (telephone).  All issues noted in this document were discussed and addressed.  No physical exam could be performed with this format.  Please refer to the patient's chart for his  consent to telehealth for James [name].   Date:  08/27/2019   ID:  James [name], DOB 14-Dec-1945, MRN [id number]  Patient Location: Home Provider Location: Home  PCP:  James Ghent, MD  Cardiologist:  James Bush, MD  Electrophysiologist:  None   Evaluation Performed:  Follow-Up Visit  Chief Complaint: Shortness of breath  History of Present Illness:    James [name] is a 74 y.o. male with history of coronary artery diseasestatus post PCI to the RCA (05/2018), HFpEF, persistent atrial fibrillation, hypertension, hyperlipidemia, hypothyroidism, and obesity.  We are speaking today for reevaluation of shortness of breath.  He was last seen in our office in 05/2019 by James [name], James [name].  At that time he was primarily limited by chronic left knee pain.  His PCP, Dr. Damita [name], reached out to [place] earlier this month, concerned that James [name] had been experiencing more exertional dyspnea.  In particular, he was concerned about the possibility of pulmonary toxicity related to amiodarone use.  Today, James [name] reports that he is actually feeling quite well.  He notes that he had only 2 episodes of shortness of breath doing chores  around the house last month.  He also experienced mild burning in both arms while doing this.  Symptoms resolved with rest and have not recurred.  He has been able to do similar activities and also has started walking without recurrent dyspnea or arm burning.  He has not had any chest pain.  Knee pain is his greatest limiting factor at this time.  He denies edema, palpitations, and lightheadedness.  He is tolerating his current medications well.  He has not had any bleeding, remaining on apixaban.  He is scheduled for his first COVID-19 vaccine this weekend.  The patient does not have symptoms concerning for COVID-19 infection (fever, chills, cough, or new shortness of breath).    Past Medical History:  Diagnosis Date  . (HFpEF) heart failure with preserved ejection fraction (James [name])    a. 05/2018 Echo: EF 55-60%, gr2 DD.  Marland Kitchen Acute lower GI bleeding after colonoscopy and polypectomy, resolved 12/12/2015  . Ascending aortic aneurysm ([place])    a. 02/2018 Echo: mildly dil Ao root/asc ao/arch; b. 05/2018 CTA Chest: 4.7cm fusiform aneurysm of Asc Ao.  Marland Kitchen CAD (coronary artery disease)    a. 05/2018 Cath/PCI: LM nl,LAD 30p, 90/99d/apical, LCX 25m OM1/2/3 min irregs, RCA 85p (3.5x18 SKelly.  . Cardiac murmur    a. 02/2018 Echo: EF 60-65%, no rwma, mild AI, mildly dil Ao root/Asc Ao/Arch, Mild MR, mildly dil LA. Nl RV fxn.  . Cataract    bilateral surgery to remove  . Colon polyp   . Constipation   . Essential hypertension   . Hemorrhoids   .  Hepatitis    self resolved, likely food exposure, 1974.   Marland Kitchen History of cardiovascular stress test    a. 02/2018 Myoview: EF 55-65%, small/mild apical defect w/ nl wall motion ->attenuation artifact. No ischemia. Low risk study.  Marland Kitchen Hx of adenomatous colonic polyps 12/05/2015  . Hyperlipidemia   . Hypothyroidism   . Mitral regurgitation    a. 110/2019 Echo: EF 55-60%. Gr2 DD. Triv AI. Ao root 38m. Mild MR. Mod dil LA, mildly dil RA.  . Osteoarthritis   .  Persistent atrial fibrillation [place]    a. Dx 02/2018. CHA2DS2VASc = 2-->Eliquis; b. 03/2018 DCCV (200J); c. 03/2018 recurrent Afib-->flecainide started 04/2018;  d. 05/06/2018 s/p successful DCCV - 200J x 2-->on amio.  . Skin cancer    basal cell, R ear, MOHS   Past Surgical History:  Procedure Laterality Date  . BROW LIFT Bilateral 10/23/2016   Procedure: BLEPHAROPLASTY upper eyelid with excess skin;  Surgeon: AKarle Starch MD;  [facility]  Service: Ophthalmology;  Laterality: Bilateral;  MAC  . CARDIOVERSION N/A 03/18/2018   Procedure: CARDIOVERSION;  Surgeon: ENelva Bush MD;  [facility];  Service: Cardiovascular;  Laterality: N/A;  . CARDIOVERSION N/A 05/06/2018   Procedure: CARDIOVERSION;  Surgeon: ENelva Bush MD;  Location: [facility];  Service: Cardiovascular;  Laterality: N/A;  . CATARACT EXTRACTION  1986   OD  . CATARACT EXTRACTION Bilateral 1995   x 2 for right and left   . COLONOSCOPY    . CORONARY STENT INTERVENTION N/A 05/13/2018   Procedure: CORONARY STENT INTERVENTION;  Surgeon: ENelva Bush MD;  [facility];  Service: Cardiovascular;  Laterality: N/A;  . ECTROPION REPAIR Bilateral 10/23/2016   Procedure: REPAIR OF ECTROPION sutures, extensive;  Surgeon: AKarle Starch MD;  [place] [facility]  Service: Ophthalmology;  Laterality: Bilateral;  . LIPOMA EXCISION  06/1997   left neck ([place])  . MOHS SURGERY Right 2013   behind right ear  . RIGHT/LEFT HEART CATH AND CORONARY ANGIOGRAPHY N/A 05/12/2018   Procedure: RIGHT/LEFT HEART CATH AND CORONARY ANGIOGRAPHY;  Surgeon: AWellington Hampshire MD;  [facility];  Service: Cardiovascular;  Laterality: N/A;  . TONSILLECTOMY    . VASECTOMY  1982     Current Meds  Medication Sig  . amiodarone (PACERONE) 200 MG tablet TAKE 1 TABLET BY MOUTH EVERY DAY  . apixaban (ELIQUIS) 5 MG TABS tablet TAKE 1 TABLET(5 MG) BY MOUTH TWICE DAILY  . atorvastatin  (LIPITOR) 80 MG tablet TAKE 1 TABLET(80 MG) BY MOUTH DAILY  . DHA-EPA-Coenzyme Q10-Vitamin E (CO Q-10 VITAMIN E FISH OIL PO) Take 1 capsule by mouth daily.  .Marland Kitchendoxazosin (CARDURA) 4 MG tablet Take 1 tablet (4 mg total) by mouth daily after breakfast.  . ezetimibe (ZETIA) 10 MG tablet Take 1 tablet (10 mg total) by mouth daily.  . finasteride (PROSCAR) 5 MG tablet Take 1 tablet (5 mg total) by mouth daily.  . furosemide (LASIX) 20 MG tablet Take 1 tablet (20 mg total) by mouth once a day.  . levothyroxine (SYNTHROID) 150 MCG tablet Take 1 tablet (150 mcg total) by mouth daily before breakfast.  . Multiple Vitamin (MULTIVITAMIN WITH MINERALS) TABS tablet Take 1 tablet by mouth daily. Senior Multivitamin  . psyllium (METAMUCIL) 58.6 % powder Take 1 packet by mouth daily.   . ramipril (ALTACE) 10 MG capsule Take 1 capsule (10 mg total) by mouth daily.     Allergies:   Sulfonamide derivatives  Social History   Tobacco Use  . Smoking status: Former Smoker    Packs/day: 2.00    Years: 20.00    Pack years: 40.00    Types: Cigarettes    Quit date: 08/07/1983    Years since quitting: 36.0  . Smokeless tobacco: Never Used  . Tobacco comment: quit over 30 years ago  Substance Use Topics  . Alcohol use: Yes    Alcohol/week: 1.0 standard drinks    Types: 1 Glasses of wine per week    Comment: per day  . Drug use: No     Family Hx: The patient's family history includes Heart disease in his paternal grandfather; Lung cancer in his maternal grandfather; Stroke in his maternal grandfather and mother. There is no history of Colon cancer, Colon polyps, Esophageal cancer, Rectal cancer, Stomach cancer, Bladder Cancer, or Prostate cancer.  ROS:   Please see the history of present illness.   All other systems reviewed and are negative.   Prior CV studies:   The following studies were reviewed today:  TTE (06/19/2019):  1. Left ventricular ejection fraction, by visual estimation, is 55 to 60%. The  left ventricle has normal function. There is no left ventricular hypertrophy.  2. Global right ventricle has normal systolic function.The right ventricular size is normal. No increase in right ventricular wall thickness.  3. Left atrial size was mild-moderately dilated.  4. Right atrial size was normal.  5. Mild to moderate mitral valve regurgitation.  6. There is mild dilatation of the aortic root measuring 38 mm. Aortic arch 3.8 cm  7. Mildly elevated pulmonary artery systolic pressure.  Labs/Other Tests and Data Reviewed:    EKG:  No ECG reviewed.  Recent Labs: 06/18/2019: ALT 14; Hemoglobin 13.6; Platelets 150.0 08/10/2019: BUN 16; Creatinine, Ser 1.43; Potassium 4.4; Sodium 139; TSH 1.43   Recent Lipid Panel Lab Results  Component Value Date/Time   CHOL 115 06/18/2019 07:44 AM   TRIG 85.0 06/18/2019 07:44 AM   HDL 43.40 06/18/2019 07:44 AM   CHOLHDL 3 06/18/2019 07:44 AM   LDLCALC 55 06/18/2019 07:44 AM    Wt Readings from Last 3 Encounters:  08/27/19 260 lb (117.9 kg)  08/17/19 264 lb 6 oz (119.9 kg)  06/09/19 255 lb (115.7 kg)     Objective:    Vital Signs:  BP 126/80 (BP Location: Right Arm, Patient Position: Sitting, Cuff Size: Normal)   Pulse 67   Ht 6' (1.829 m)   Wt 260 lb (117.9 kg)   BMI 35.26 kg/m    VITAL SIGNS:  reviewed  ASSESSMENT & PLAN:    Persistent atrial fibrillation and shortness of breath: Mr. Kabler does not report symptoms to suggest recurrence of atrial fibrillation.  His 2 self-limited episodes of shortness of breath and arm burning are not consistent with amiodarone lung toxicity.  Nonetheless, I think it would be worthwhile for [place] to obtain PFTs for monitoring.  Given that he has maintained sinus rhythm for over a year now, I think it would be reasonable to decrease amiodarone to 100 mg daily.  He will remain on indefinite anticoagulation with apixaban, given CHA2DS2-VASc score of at least 4.  Chronic HFpEF: Mr. Keysor reports NYHA class II  symptoms, though it is possible that his transient shortness of breath last month could have also been a manifestation of CHF.  His weight is stable.  He denies significant edema.  I think it is reasonable to continue current dose of furosemide 20 mg daily.  Coronary artery disease without angina: Mr. Shadowens has not had any chest pain.  It is possible that exertional dyspnea and arm burning last month could have been an anginal equivalent, though he has not had any further symptoms doing similar activities.  We discussed noninvasive ischemia testing but have agreed to defer this, given resolution of symptoms.  We will continue high intensity statin therapy for secondary prevention.  Mr. Halm is not on aspirin given the long-term anticoagulation with apixaban.  I encouraged him to continue his walking regimen, as tolerated.  Hypertension: Blood pressure upper normal.  Continue doxazosin and ramipril.  Hyperlipidemia: LDL at goal.  Continue atorvastatin 80 mg daily.  Hypothyroidism: Most recent TSH normal.  Continue current dose of levothyroxine and follow-up with Dr. Damita [name].  COVID-19 Education: The signs and symptoms of COVID-19 were discussed with the patient and how to seek care for testing (follow up with PCP or arrange E-visit).  The importance of social distancing was discussed today.  Time:   Today, I have spent 13 minutes with the patient with telehealth technology discussing the above problems.     Medication Adjustments/Labs and Tests Ordered: Current medicines are reviewed at length with the patient today.  Concerns regarding medicines are outlined above.   Tests Ordered: PFT's  Medication Changes: Decrease amiodarone to 100 mg daily.  Follow Up:  In Person in 3 month(s)  Signed, James Bush, MD  08/27/2019 8:39 AM    [place]

## 2019-08-27 ENCOUNTER — Telehealth (INDEPENDENT_AMBULATORY_CARE_PROVIDER_SITE_OTHER): Payer: Medicare Other | Admitting: Internal Medicine

## 2019-08-27 ENCOUNTER — Other Ambulatory Visit: Payer: Self-pay

## 2019-08-27 ENCOUNTER — Encounter: Payer: Self-pay | Admitting: Internal Medicine

## 2019-08-27 VITALS — BP 126/80 | HR 67 | Ht 72.0 in | Wt 260.0 lb

## 2019-08-27 DIAGNOSIS — R0602 Shortness of breath: Secondary | ICD-10-CM | POA: Diagnosis not present

## 2019-08-27 DIAGNOSIS — I5032 Chronic diastolic (congestive) heart failure: Secondary | ICD-10-CM | POA: Diagnosis not present

## 2019-08-27 DIAGNOSIS — E785 Hyperlipidemia, unspecified: Secondary | ICD-10-CM

## 2019-08-27 DIAGNOSIS — I2510 Atherosclerotic heart disease of native coronary artery without angina pectoris: Secondary | ICD-10-CM

## 2019-08-27 DIAGNOSIS — I10 Essential (primary) hypertension: Secondary | ICD-10-CM

## 2019-08-27 DIAGNOSIS — I4819 Other persistent atrial fibrillation: Secondary | ICD-10-CM

## 2019-08-27 DIAGNOSIS — E039 Hypothyroidism, unspecified: Secondary | ICD-10-CM

## 2019-08-27 MED ORDER — AMIODARONE HCL 100 MG PO TABS: ORAL_TABLET | ORAL | 6 refills | Status: DC

## 2019-08-27 NOTE — Patient Instructions (Signed)
Medication Instructions:  Your physician has recommended you make the following change in your medication:  1- DECREASE Amiodarone to 100 mg by mouth once a day (patient plans to split his current tablet in half.)  *If you need a refill on your cardiac medications before your next appointment, please call your pharmacy*  Lab Work: none If you have labs (blood work) drawn today and your tests are completely normal, you will receive your results only by: Marland Kitchen MyChart Message (if you have MyChart) OR . A paper copy in the mail If you have any lab test that is abnormal or we need to change your treatment, we will call you to review the results.  Testing/Procedures: Your physician has recommended that you have a pulmonary function test. Pulmonary Function Tests are a group of tests that measure how well air moves in and out of your lungs. PFTs are on hold for scheduling at this time due to [place].     Follow-Up: At [facility], you and your health needs are our priority.  As part of our continuing mission to provide you with exceptional heart care, we have created designated Provider Care Teams.  These Care Teams include your primary Cardiologist (physician) and Advanced Practice Providers (APPs -  Physician Assistants and Nurse Practitioners) who all work together to provide you with the care you need, when you need it.  Your next appointment:   3 month(s)  The format for your next appointment:   In Person  Provider:    You may see Nelva Bush, MD or one of the following Advanced Practice Providers on your designated Care Team:    Murray Hodgkins, NP  Christell Faith, PA-C  Marrianne Mood, PA-C    Pulmonary Function Tests Pulmonary function tests (PFTs) are used to measure how well your lungs work, find out what is causing your lung problems, and figure out the best treatment for you. You may have PFTs:  When you have an illness involving the lungs.  To follow changes in  your lung function over time if you have a chronic lung disease.  If you are an Nature conservation officer. This checks the effects of being exposed to chemicals over a long period of time.  To check lung function before having surgery or other procedures.  To check your lungs if you smoke.  To check if prescribed medicines or treatments are helping your lungs. Your results will be compared to the expected lung function of someone with healthy lungs who is similar to you in:  Age.  Gender.  Height.  Weight.  Race or ethnicity. This is done to show how your lungs compare to normal lung function (percent predicted). This is how your health care provider knows if your lung function is normal or not. If you have had PFTs done before, your health care provider will compare your current results with past results. This shows if your lung function is better, worse, or the same as before. Tell a health care provider about:  Any allergies you have.  All medicines you are taking, including inhaler or nebulizer medicines, vitamins, herbs, eye drops, creams, and over-the-counter medicines.  Any blood disorders you have.  Any surgeries you have had, especially recent eye surgery, abdominal surgery, or chest surgery. These can make PFTs difficult or unsafe.  Any medical conditions you have, including chest pain or heart problems, tuberculosis, or respiratory infections such as pneumonia, a cold, or the flu.  Any fear of being in closed  spaces (claustrophobia). Some of your tests may be in a closed space. What are the risks? Generally, this is a safe procedure. However, problems may occur, including:  Light-headedness due to over-breathing (hyperventilation).  An asthma attack from deep breathing.  A collapsed lung. What happens before the procedure?  Take over-the-counter and prescription medicines only as told by your health care provider. If you take inhaler or nebulizer medicines, ask  your health care provider which medicines you should take on the day of your testing. Some inhaler medicines may interfere with PFTs if they are taken shortly before the tests.  Follow your health care provider's instructions on eating and drinking restrictions. This may include avoiding eating large meals and drinking alcohol before the testing.  Do not use any products that contain nicotine or tobacco, such as cigarettes and e-cigarettes. If you need help quitting, ask your health care provider.  Wear comfortable clothing that will not interfere with breathing. What happens during the procedure?   You will be given a soft nose clip to wear. This is done so all of your breaths will go through your mouth instead of your nose.  You will be given a germ-free (sterile) mouthpiece. It will be attached to a machine that measures your breathing (spirometer).  You will be asked to do various breathing maneuvers. The maneuvers will be done by breathing in (inhaling) and breathing out (exhaling). You may be asked to repeat the maneuvers several times before the testing is done.  It is important to follow the instructions exactly to get accurate results. Make sure to blow as hard and as fast as you can when you are told to do so.  You may be given a medicine that makes the small air passages in your lungs larger (bronchodilator) after testing has been done. This medicine will make it easier for you to breathe.  The tests will be repeated after the bronchodilator has taken effect.  You will be monitored carefully during the procedure for faintness, dizziness, trouble breathing, or any other problems. The procedure may vary among health care providers and hospitals. What happens after the procedure?  It is up to you to get your test results. Ask your health care provider, or the department that is doing the tests, when your results will be ready. After you have received your test results, talk with  your health care provider about treatment options, if necessary. Summary  Pulmonary function tests (PFTs) are used to measure how well your lungs work, find out what is causing your lung problems, and figure out the best treatment for you.  Wear comfortable clothing that will not interfere with breathing.  It is up to you to get your test results. After you have received them, talk with your health care provider about treatment options, if necessary. This information is not intended to replace advice given to you by your health care provider. Make sure you discuss any questions you have with your health care provider. Document Revised: 07/20/2016 Document Reviewed: 06/14/2016 Elsevier Patient Education  2020 [employer].

## 2019-08-29 ENCOUNTER — Ambulatory Visit: Payer: Medicare Other | Attending: Internal Medicine

## 2019-08-29 DIAGNOSIS — Z23 Encounter for immunization: Secondary | ICD-10-CM | POA: Insufficient documentation

## 2019-08-29 NOTE — Progress Notes (Signed)
   Covid-19 Vaccination Clinic  Name:  James [name]    MRN: [id number] DOB: 11-Aug-1945  08/29/2019  [name] was observed post Covid-19 immunization for 15 minutes without incidence. He was provided with Vaccine Information Sheet and instruction to access the V-Safe system.   [name] was instructed to call 911 with any severe reactions post vaccine: Marland Kitchen Difficulty breathing  . Swelling of your face and throat  . A fast heartbeat  . A bad rash all over your body  . Dizziness and weakness    Immunizations Administered    Name Date Dose VIS Date Route   Pfizer COVID-19 Vaccine 08/29/2019  2:29 PM 0.3 mL 07/17/2019 Intramuscular   Manufacturer: [place]   Lot: [id number]   [place]: [id number]

## 2019-09-01 ENCOUNTER — Other Ambulatory Visit: Payer: Self-pay | Admitting: *Deleted

## 2019-09-03 ENCOUNTER — Telehealth: Payer: Self-pay

## 2019-09-03 NOTE — Telephone Encounter (Signed)
Pharmacist did not leave name from [place] left v/m that they are filling Eliquis rx and noticed pt is getting clopidogrel from a different provider. Is pt supposed to take both Eliquis and Clopidogrel or does Eliquis take the place of clopidogrel. Pharmacist request cb using ref # [id number].

## 2019-09-03 NOTE — Telephone Encounter (Signed)
Pharmacist advised

## 2019-09-03 NOTE — Telephone Encounter (Signed)
Per cards note from 05/12/20, he was to d/c plavix but continue eliquis.  Please update pharmacy.  I appreciate the call.  Thanks.

## 2019-09-07 ENCOUNTER — Encounter: Payer: Self-pay | Admitting: Nurse Practitioner

## 2019-09-07 ENCOUNTER — Ambulatory Visit: Payer: Medicare Other | Admitting: Nurse Practitioner

## 2019-09-07 ENCOUNTER — Telehealth: Payer: Self-pay

## 2019-09-07 VITALS — BP 110/66 | HR 83 | Temp 98.4°F | Ht 72.0 in | Wt 267.0 lb

## 2019-09-07 DIAGNOSIS — Z7901 Long term (current) use of anticoagulants: Secondary | ICD-10-CM

## 2019-09-07 DIAGNOSIS — Z86010 Personal history of colonic polyps: Secondary | ICD-10-CM | POA: Diagnosis not present

## 2019-09-07 NOTE — Telephone Encounter (Signed)
Hi. Dr. Saunders Revel,   [name] has a colonoscopy planned for 10/19/2019. It looks like you recently saw him for a telehealth visit on 08/27/2019 at which time patient reported 2 episodes of shortness of breath with associated mild burning down both arms while doing chores that resolved with rest. There was mention in the plan of possible noninvasive ischemia testing but with was deferred due to resolution of symptoms. I know colonoscopy is a low risk procedure but just wanted to make sure that you did not feel like he needed any additional work-up prior to this.  Please route response back to P [facility] PREOP.  Thank you! Davy Westmoreland

## 2019-09-07 NOTE — Progress Notes (Signed)
ASSESSMENT / PLAN:   [birth year]. 74 yo male with history of adenomatous colon polyps due for 3 year surveillance colonoscopy. No GI complaints.  --The risks and benefits of colonoscopy with possible polypectomy / biopsies were discussed and the patient agrees to proceed.   2. CAD / Afib on chronic Eliquis ( for one year) --Hold Eliquis for 2 days before procedure - will instruct when and how to resume after procedure. Patient understands that there is a low but real risk of cardiovascular event such as heart attack, stroke, or embolism /  thrombosis while off blood thinner. The patient consents to proceed. Will communicate by phone or EMR with patient's prescribing provider to confirm that holding Eliquis is reasonable in this case.   HPI:    Chief Complaint:   None. Time for colonoscopy  74 yo male with HTN, Afib s/p cardioversion, CAD s/p PCI Oct 2019, hyperlipidemia, hypothyroidism, hx of colon polyps.   James [name] is due for 3 year polyps surveillance colonoscopy. He has no GI complaints. No general medical complaints such as chest pain, SOB. No palpitations.    Past Medical History:  Diagnosis Date  . (HFpEF) heart failure with preserved ejection fraction ([place])    a. 05/2018 Echo: EF 55-60%, gr2 DD.  Marland Kitchen Acute lower GI bleeding after colonoscopy and polypectomy, resolved 12/12/2015  . Ascending aortic aneurysm ([place])    a. 02/2018 Echo: mildly dil Ao root/asc ao/arch; b. 05/2018 CTA Chest: 4.7cm fusiform aneurysm of Asc Ao.  Marland Kitchen CAD (coronary artery disease)    a. 05/2018 Cath/PCI: LM nl,LAD 30p, 90/99d/apical, LCX 9m OM1/2/3 min irregs, RCA 85p (3.5x18 [place].  . Cardiac murmur    a. 02/2018 Echo: EF 60-65%, no rwma, mild AI, mildly dil Ao root/Asc Ao/Arch, Mild MR, mildly dil LA. Nl RV fxn.  . Cataract    bilateral surgery to remove  . Colon polyp   . Constipation   . Essential hypertension   . Hemorrhoids   . Hepatitis    self resolved, likely food exposure,  1974.   .Marland KitchenHistory of cardiovascular stress test    a. 02/2018 Myoview: EF 55-65%, small/mild apical defect w/ nl wall motion ->attenuation artifact. No ischemia. Low risk study.  .Marland KitchenHx of adenomatous colonic polyps 12/05/2015  . Hyperlipidemia   . Hypothyroidism   . Mitral regurgitation    a. 110/2019 Echo: EF 55-60%. Gr2 DD. Triv AI. Ao root 398m Mild MR. Mod dil LA, mildly dil RA.  . Osteoarthritis   . Persistent atrial fibrillation ([facility] [place]   a. Dx 02/2018. CHA2DS2VASc = 2-->Eliquis; b. 03/2018 DCCV (200J); c. 03/2018 recurrent Afib-->flecainide started 04/2018;  d. 05/06/2018 s/p successful DCCV - 200J x 2-->on amio.  . Skin cancer    basal cell, R ear, MOHS     Past Surgical History:  Procedure Laterality Date  . BROW LIFT Bilateral 10/23/2016   Procedure: BLEPHAROPLASTY upper eyelid with excess skin;  Surgeon: AmKarle StarchMD;  [facility]: [facility];  Laterality: Bilateral;  MAC  . CARDIOVERSION N/A 03/18/2018   Procedure: CARDIOVERSION;  Surgeon: EnNelva BushMD;  [facility];  Service: Cardiovascular;  Laterality: N/A;  . CARDIOVERSION N/A 05/06/2018   Procedure: CARDIOVERSION;  Surgeon: EnNelva BushMD;  [facility];  Service: Cardiovascular;  Laterality: N/A;  . CATARACT EXTRACTION  1986   OD  . CATARACT EXTRACTION Bilateral  1995   x 2 for right and left   . COLONOSCOPY    . CORONARY STENT INTERVENTION N/A 05/13/2018   Procedure: CORONARY STENT INTERVENTION;  Surgeon: Nelva Bush, MD;  Location: [facility];  Service: Cardiovascular;  Laterality: N/A;  . ECTROPION REPAIR Bilateral 10/23/2016   Procedure: REPAIR OF ECTROPION sutures, extensive;  Surgeon: Karle Starch, MD;  Location: [facility];  Service: Ophthalmology;  Laterality: Bilateral;  . LIPOMA EXCISION  06/1997   left neck ([place])  . MOHS SURGERY Right 2013   behind right ear  . RIGHT/LEFT HEART CATH AND CORONARY ANGIOGRAPHY N/A 05/12/2018    Procedure: RIGHT/LEFT HEART CATH AND CORONARY ANGIOGRAPHY;  Surgeon: Wellington Hampshire, MD;  Location: [place] [facility];  Service: Cardiovascular;  Laterality: N/A;  . TONSILLECTOMY    . VASECTOMY  1982   Family History  Problem Relation Age of Onset  . Heart disease Paternal Grandfather        MI, old age  . Stroke Mother   . Lung cancer Maternal Grandfather        smoker  . Stroke Maternal Grandfather   . Colon cancer Neg Hx   . Colon polyps Neg Hx   . Esophageal cancer Neg Hx   . Rectal cancer Neg Hx   . Stomach cancer Neg Hx   . Bladder Cancer Neg Hx   . Prostate cancer Neg Hx    Social History   Tobacco Use  . Smoking status: Former Smoker    Packs/day: 2.00    Years: 20.00    Pack years: 40.00    Types: Cigarettes    Quit date: 08/07/1983    Years since quitting: 36.1  . Smokeless tobacco: Never Used  . Tobacco comment: quit over 30 years ago  Substance Use Topics  . Alcohol use: Yes    Alcohol/week: 1.0 standard drinks    Types: 1 Glasses of wine per week    Comment: per day  . Drug use: No   Current Outpatient Medications  Medication Sig Dispense Refill  . amiodarone (PACERONE) 100 MG tablet TAKE 1 TABLET BY MOUTH EVERY DAY 30 tablet 6  . apixaban (ELIQUIS) 5 MG TABS tablet TAKE 1 TABLET(5 MG) BY MOUTH TWICE DAILY 180 tablet 3  . atorvastatin (LIPITOR) 80 MG tablet TAKE 1 TABLET(80 MG) BY MOUTH DAILY 90 tablet 0  . DHA-EPA-Coenzyme Q10-Vitamin E (CO Q-10 VITAMIN E FISH OIL PO) Take 1 capsule by mouth daily.    Marland Kitchen doxazosin (CARDURA) 4 MG tablet Take 1 tablet (4 mg total) by mouth daily after breakfast. 90 tablet 3  . ezetimibe (ZETIA) 10 MG tablet Take 1 tablet (10 mg total) by mouth daily. 90 tablet 3  . finasteride (PROSCAR) 5 MG tablet Take 1 tablet (5 mg total) by mouth daily. 90 tablet 3  . furosemide (LASIX) 20 MG tablet Take 1 tablet (20 mg total) by mouth once a day. 90 tablet 2  . levothyroxine (SYNTHROID) 150 MCG tablet Take 1 tablet (150 mcg total)  by mouth daily before breakfast. 90 tablet 1  . Multiple Vitamin (MULTIVITAMIN WITH MINERALS) TABS tablet Take 1 tablet by mouth daily. Senior Multivitamin    . psyllium (METAMUCIL) 58.6 % powder Take 1 packet by mouth daily.     . ramipril (ALTACE) 10 MG capsule Take 1 capsule (10 mg total) by mouth daily. 90 capsule 3   No current facility-administered medications for this visit.   Allergies  Allergen Reactions  .  Sulfonamide Derivatives Rash     Review of Systems: All systems reviewed and negative except where noted in HPI.   Creatinine clearance cannot be calculated (Patient's most recent lab result is older than the maximum 21 days allowed.)   Physical Exam:    Wt Readings from Last 3 Encounters:  09/07/19 121.1 kg  08/27/19 117.9 kg  08/17/19 119.9 kg    BP 110/66   Pulse 83   Temp 98.4 F (36.9 C)   Ht 6' (1.829 m)   Wt 121.1 kg   BMI 36.21 kg/m  Constitutional:  Pleasant male in no acute distress. Psychiatric: Normal mood and affect. Behavior is normal. EENT: Pupils normal.  Conjunctivae are normal. No scleral icterus. Neck supple.  Cardiovascular: Normal rate, regular rhythm. No edema Pulmonary/chest: Effort normal and breath sounds normal. No wheezing, rales or rhonchi. Abdominal: Soft, nondistended, nontender. Bowel sounds active throughout. There are no masses palpable. No hepatomegaly. Neurological: Alert and oriented to person place and time. Skin: Skin is warm and dry. No rashes noted.  Tye Savoy, NP  09/07/2019, 9:36 AM   Tonia Ghent, MD

## 2019-09-07 NOTE — Telephone Encounter (Signed)
[facility] Pre-operative Risk Assessment     Request for surgical clearance:     Endoscopy Procedure  What type of surgery is being performed?     Colonoscopy  When is this surgery scheduled?     10/19/19  What type of clearance is required ?   Pharmacy  Are there any medications that need to be held prior to surgery and how long? HOLD ELIQUIS FOR TWO DAYS PRIOR  Practice name and name of physician performing surgery?      [facility]/ Dr. Carlean Purl  What is your office phone and fax number?      Phone- [phone]  Fax- [phone] Attn: Peter Congo, RMA  Anesthesia type (None, local, MAC, general) ?       MAC Attn: Dr. Loletha Grayer End

## 2019-09-07 NOTE — Patient Instructions (Signed)
If you are age 73 or older, your body mass index should be between 23-30. Your Body mass index is 36.21 kg/m. If this is out of the aforementioned range listed, please consider follow up with your Primary Care Provider.  If you are age 45 or younger, your body mass index should be between 19-25. Your Body mass index is 36.21 kg/m. If this is out of the aformentioned range listed, please consider follow up with your Primary Care Provider.   You have been scheduled for a colonoscopy. Please follow written instructions given to you at your visit today.  Please pick up your prep supplies at the pharmacy within the next 1-3 days. If you use inhalers (even only as needed), please bring them with you on the day of your procedure. Your physician has requested that you go to www.startemmi.com and enter the access code given to you at your visit today. This web site gives a general overview about your procedure. However, you should still follow specific instructions given to you by our office regarding your preparation for the procedure.  You will be contacted by our office prior to your procedure for directions on holding your Eliquis.  If you do not hear from our office 1 week prior to your scheduled procedure, please call [phone] to discuss.   Thank you for choosing me and [facility].   Tye Savoy, NP

## 2019-09-07 NOTE — Telephone Encounter (Signed)
Pt takes Eliquis for afib with CHADS2VASc score of 4 (age, CHF, HTN, CAD). SCr 1.43, CrCl 80 using actual body weight. Ok to hold Eliquis for 2 days as requested.

## 2019-09-08 ENCOUNTER — Telehealth: Payer: Self-pay | Admitting: Family Medicine

## 2019-09-08 NOTE — Telephone Encounter (Signed)
As long as James [name] has not had any further shortness of breath and arm burning, I think it is fine to proceed with colonoscopy without additional testing.  Nelva Bush, MD [facility]

## 2019-09-08 NOTE — Telephone Encounter (Signed)
James [name], see PCP's note regarding anticoagulation please

## 2019-09-08 NOTE — Telephone Encounter (Signed)
   Primary Cardiologist: Nelva Bush, MD  Chart reviewed as part of pre-operative protocol coverage. Patient recently seen by Dr. Saunders Revel for a telehealth visit on 08/27/2019 at which time patient reported 2 episodes of shortness of breath with associated mild burning pain down both arms while doing chores around the house the month prior. Due to resolution of symptoms, no noninvasive ischemia testing was ordered. I called and spoke with patient today and he reports no recurrence of these symptoms since last visit. No chest/arm pain, shortness of breath, palpitations, lightheadedness, dizziness, syncope, orthopnea, PND, or edema. Discussed with Dr. Saunders Revel - Given no recurrence of shortness of breath with associated arm burning, OK to proceed with colonoscopy without additional testing.   Per Pharmacy, OK to hold Eliquis for 2 days prior to colonoscopy. Should restart as soon as able following procedure.   I will route this recommendation to the requesting party via Epic fax function and remove from pre-op pool.  Please call with questions.  Darreld Mclean, PA-C 09/08/2019, 9:49 AM

## 2019-09-08 NOTE — Telephone Encounter (Signed)
Would hold eliquis for 2 days prior to procedure and also the AM of the procedure.  That would be 5 doses held prior to procedure, with restart the day after procedure assuming GI approves.  Please update patient or let me know if we need to update patient.  I appreciate the help of all involved.

## 2019-09-08 NOTE — Telephone Encounter (Signed)
Spoke with patient this afternoon regarding holding Eliquis two days prior to colonoscopy.  He states that his cardiologist has already contacted him to hold Eliquis two days prior.  Patient verbalized understanding.

## 2019-09-10 ENCOUNTER — Ambulatory Visit: Payer: Medicare Other | Admitting: Nurse Practitioner

## 2019-09-14 ENCOUNTER — Ambulatory Visit: Payer: Medicare Other | Admitting: Podiatry

## 2019-09-14 ENCOUNTER — Other Ambulatory Visit: Payer: Self-pay

## 2019-09-14 DIAGNOSIS — L600 Ingrowing nail: Secondary | ICD-10-CM | POA: Diagnosis not present

## 2019-09-15 ENCOUNTER — Encounter: Payer: Self-pay | Admitting: Podiatry

## 2019-09-15 NOTE — Progress Notes (Signed)
Subjective:  Patient ID: James [name], male    DOB: 05-22-1946,  MRN: [id number]  Chief Complaint  Patient presents with  . Ingrown Toenail    pt is here for bil ingrown toenails of both big toenails which has been going on for about a week, pain is elevated to the touch, pt also states that he has tried debriding them himself, but not much has helped it.    74 y.o. male presents with the above complaint.  Patient presents with an ingrown to the bilateral lateral portion of the hallux.  Patient states that has been going on for about a week.  It has progressively gotten worse especially when applying pressure.  Patient has tried his own debridement but has not much help.  Patient says there is some oozing present.  There is also aching sensation present as well.  He denies seeing anyone else for this.  He denies seeing any other podiatrist for this.   Review of Systems: Negative except as noted in the HPI. Denies N/V/F/Ch.  Past Medical History:  Diagnosis Date  . (HFpEF) heart failure with preserved ejection fraction ([place])    a. 05/2018 Echo: EF 55-60%, gr2 DD.  Marland Kitchen Acute lower GI bleeding after colonoscopy and polypectomy, resolved 12/12/2015  . Ascending aortic aneurysm ([place])    a. 02/2018 Echo: mildly dil Ao root/asc ao/arch; b. 05/2018 CTA Chest: 4.7cm fusiform aneurysm of Asc Ao.  Marland Kitchen CAD (coronary artery disease)    a. 05/2018 Cath/PCI: LM nl,LAD 30p, 90/99d/apical, LCX 67m, OM1/2/3 min irregs, RCA 85p (3.5x18 [place]).  . Cardiac murmur    a. 02/2018 Echo: EF 60-65%, no rwma, mild AI, mildly dil Ao root/Asc Ao/Arch, Mild MR, mildly dil LA. Nl RV fxn.  . Cataract    bilateral surgery to remove  . Colon polyp   . Constipation   . Essential hypertension   . Hemorrhoids   . Hepatitis    self resolved, likely food exposure, 1974.   Marland Kitchen History of cardiovascular stress test    a. 02/2018 Myoview: EF 55-65%, small/mild apical defect w/ nl wall motion ->attenuation artifact. No  ischemia. Low risk study.  Marland Kitchen Hx of adenomatous colonic polyps 12/05/2015  . Hyperlipidemia   . Hypothyroidism   . Mitral regurgitation    a. 110/2019 Echo: EF 55-60%. Gr2 DD. Triv AI. Ao root 55mm. Mild MR. Mod dil LA, mildly dil RA.  . Osteoarthritis   . Persistent atrial fibrillation ([place])    a. Dx 02/2018. CHA2DS2VASc = 2-->Eliquis; b. 03/2018 DCCV (200J); c. 03/2018 recurrent Afib-->flecainide started 04/2018;  d. 05/06/2018 s/p successful DCCV - 200J x 2-->on amio.  . Skin cancer    basal cell, R ear, MOHS    Current Outpatient Medications:  .  amiodarone (PACERONE) 100 MG tablet, TAKE 1 TABLET BY MOUTH EVERY DAY, Disp: 30 tablet, Rfl: 6 .  amiodarone (PACERONE) 200 MG tablet, Take 200 mg by mouth daily., Disp: , Rfl:  .  apixaban (ELIQUIS) 5 MG TABS tablet, TAKE 1 TABLET(5 MG) BY MOUTH TWICE DAILY, Disp: 180 tablet, Rfl: 3 .  atorvastatin (LIPITOR) 80 MG tablet, TAKE 1 TABLET(80 MG) BY MOUTH DAILY, Disp: 90 tablet, Rfl: 0 .  DHA-EPA-Coenzyme Q10-Vitamin E (CO Q-10 VITAMIN E FISH OIL PO), Take 1 capsule by mouth daily., Disp: , Rfl:  .  doxazosin (CARDURA) 4 MG tablet, Take 1 tablet (4 mg total) by mouth daily after breakfast., Disp: 90 tablet, Rfl: 3 .  ezetimibe (ZETIA) 10 MG  tablet, Take 1 tablet (10 mg total) by mouth daily., Disp: 90 tablet, Rfl: 3 .  finasteride (PROSCAR) 5 MG tablet, Take 1 tablet (5 mg total) by mouth daily., Disp: 90 tablet, Rfl: 3 .  furosemide (LASIX) 20 MG tablet, Take 1 tablet (20 mg total) by mouth once a day., Disp: 90 tablet, Rfl: 2 .  levothyroxine (SYNTHROID) 150 MCG tablet, Take 1 tablet (150 mcg total) by mouth daily before breakfast., Disp: 90 tablet, Rfl: 1 .  Multiple Vitamin (MULTIVITAMIN WITH MINERALS) TABS tablet, Take 1 tablet by mouth daily. Senior Multivitamin, Disp: , Rfl:  .  psyllium (METAMUCIL) 58.6 % powder, Take 1 packet by mouth daily. , Disp: , Rfl:  .  ramipril (ALTACE) 10 MG capsule, Take 1 capsule (10 mg total) by mouth daily., Disp: 90  capsule, Rfl: 3 .  Vitamins/Minerals TABS, Take by mouth., Disp: , Rfl:   Social History   Tobacco Use  Smoking Status Former Smoker  . Packs/day: 2.00  . Years: 20.00  . Pack years: 40.00  . Types: Cigarettes  . Quit date: 08/07/1983  . Years since quitting: 36.1  Smokeless Tobacco Never Used  Tobacco Comment   quit over 30 years ago    Allergies  Allergen Reactions  . Sulfonamide Derivatives Rash   Objective:  There were no vitals filed for this visit. There is no height or weight on file to calculate BMI. Constitutional Well developed. Well nourished.  Vascular Dorsalis pedis pulses palpable bilaterally. Posterior tibial pulses palpable bilaterally. Capillary refill normal to all digits.  No cyanosis or clubbing noted. Pedal hair growth normal.  Neurologic Normal speech. Oriented to person, place, and time. Epicritic sensation to light touch grossly present bilaterally.  Dermatologic Painful ingrowing nail at lateral nail borders of the hallux nail bilaterally. No other open wounds. No skin lesions.  Orthopedic: Normal joint ROM without pain or crepitus bilaterally. No visible deformities. No bony tenderness.   Radiographs: None Assessment:  No diagnosis found. Plan:  Patient was evaluated and treated and all questions answered.  Ingrown Nail, bilaterally -Patient elects to proceed with minor surgery to remove ingrown toenail removal today. Consent reviewed and signed by patient. -Ingrown nail excised. See procedure note. -Educated on post-procedure care including soaking. Written instructions provided and reviewed. -Patient to follow up in 2 weeks for nail check.  Procedure: Excision of Ingrown Toenail Location: Bilateral 1st toe lateral nail borders. Anesthesia: Lidocaine 1% plain; 1.5 mL and Marcaine 0.5% plain; 1.5 mL, digital block. Skin Prep: Betadine. Dressing: Silvadene; telfa; dry, sterile, compression dressing. Technique: Following skin prep, the  toe was exsanguinated and a tourniquet was secured at the base of the toe. The affected nail border was freed, split with a nail splitter, and excised. Chemical matrixectomy was then performed with phenol and irrigated out with alcohol. The tourniquet was then removed and sterile dressing applied. Disposition: Patient tolerated procedure well. Patient to return in 2 weeks for follow-up.   No follow-ups on file.

## 2019-09-17 ENCOUNTER — Other Ambulatory Visit: Payer: Self-pay

## 2019-09-17 ENCOUNTER — Ambulatory Visit: Payer: Medicare Other | Admitting: Podiatry

## 2019-09-17 DIAGNOSIS — L600 Ingrowing nail: Secondary | ICD-10-CM

## 2019-09-17 DIAGNOSIS — L03031 Cellulitis of right toe: Secondary | ICD-10-CM

## 2019-09-17 MED ORDER — DOXYCYCLINE HYCLATE 100 MG PO TABS: 100.0000 mg | ORAL_TABLET | Freq: Two times a day (BID) | ORAL | 0 refills | Status: DC

## 2019-09-18 ENCOUNTER — Encounter: Payer: Self-pay | Admitting: Podiatry

## 2019-09-18 NOTE — Progress Notes (Signed)
Subjective: James [name] is a 74 y.o.  male returns to office today for follow up evaluation after having bilateral Hallux Lateral border nail avulsion performed. Patient has been soaking using epsom salt and applying topical antibiotic covered with bandaid daily. Patient denies fevers, chills, nausea, vomiting. Denies any calf pain, chest pain, SOB.   Objective:  Vitals: Reviewed  General: Well developed, nourished, in no acute distress, alert and oriented x3   Dermatology: Skin is warm, dry and supple bilateral. Lateral hallux nail border appears to be clean, dry, with mild granular tissue and surrounding scab. There is no surrounding, edema, drainage/purulence. The remaining nails appear unremarkable at this time. There are no other lesions or other signs of infection present.  Mild erythema noted surrounding both of the hallux is right greater than left.  Neurovascular status: Intact. No lower extremity swelling; No pain with calf compression bilateral.  Musculoskeletal: Decreased tenderness to palpation of the Lateral hallux nail fold(s). Muscular strength within normal limits bilateral.   Assesement and Plan: S/p partial nail avulsion, doing well.   -Continue soaking in epsom salts twice a day followed by antibiotic ointment and a band-aid. Can leave uncovered at night. Continue this until completely healed.  -If the area has not healed in 2 weeks, call the office for follow-up appointment, or sooner if any problems arise.  -Monitor for any signs/symptoms of infection. Call the office immediately if any occur or go directly to the emergency room. Call with any questions/concerns. -Doxycycline was dispensed for 10 days skin and soft tissue prophylaxis.  Boneta Lucks, DPM

## 2019-09-19 ENCOUNTER — Ambulatory Visit: Payer: Medicare Other | Attending: Internal Medicine

## 2019-09-19 DIAGNOSIS — Z23 Encounter for immunization: Secondary | ICD-10-CM | POA: Insufficient documentation

## 2019-09-19 NOTE — Progress Notes (Signed)
   Covid-19 Vaccination Clinic  Name:  James [name]    MRN: [id number] DOB: Jan 18, 1946  09/19/2019  James [name] was observed post Covid-19 immunization for 15 minutes without incidence. He was provided with Vaccine Information Sheet and instruction to access the V-Safe system.   James [name] was instructed to call 911 with any severe reactions post vaccine: Marland Kitchen Difficulty breathing  . Swelling of your face and throat  . A fast heartbeat  . A bad rash all over your body  . Dizziness and weakness    Immunizations Administered    Name Date Dose VIS Date Route   Pfizer COVID-19 Vaccine 09/19/2019 12:52 PM 0.3 mL 07/17/2019 Intramuscular   Manufacturer: [place]   Lot: [id number]   [place]: [id number]

## 2019-09-23 DIAGNOSIS — Z85828 Personal history of other malignant neoplasm of skin: Secondary | ICD-10-CM | POA: Diagnosis not present

## 2019-09-23 DIAGNOSIS — Z85820 Personal history of malignant melanoma of skin: Secondary | ICD-10-CM | POA: Diagnosis not present

## 2019-09-23 DIAGNOSIS — D2262 Melanocytic nevi of left upper limb, including shoulder: Secondary | ICD-10-CM | POA: Diagnosis not present

## 2019-09-23 DIAGNOSIS — D225 Melanocytic nevi of trunk: Secondary | ICD-10-CM | POA: Diagnosis not present

## 2019-09-28 ENCOUNTER — Ambulatory Visit: Payer: Medicare Other

## 2019-09-28 ENCOUNTER — Ambulatory Visit: Payer: Medicare Other | Admitting: Podiatry

## 2019-10-15 ENCOUNTER — Encounter: Payer: Self-pay | Admitting: Internal Medicine

## 2019-10-19 ENCOUNTER — Ambulatory Visit (AMBULATORY_SURGERY_CENTER): Payer: Medicare Other | Admitting: Internal Medicine

## 2019-10-19 ENCOUNTER — Other Ambulatory Visit: Payer: Self-pay

## 2019-10-19 ENCOUNTER — Encounter: Payer: Self-pay | Admitting: Internal Medicine

## 2019-10-19 VITALS — BP 115/73 | HR 49 | Temp 96.8°F | Resp 11 | Ht 72.0 in | Wt 267.0 lb

## 2019-10-19 DIAGNOSIS — Z86010 Personal history of colonic polyps: Secondary | ICD-10-CM | POA: Diagnosis not present

## 2019-10-19 DIAGNOSIS — D122 Benign neoplasm of ascending colon: Secondary | ICD-10-CM | POA: Diagnosis not present

## 2019-10-19 DIAGNOSIS — D126 Benign neoplasm of colon, unspecified: Secondary | ICD-10-CM | POA: Diagnosis not present

## 2019-10-19 DIAGNOSIS — D123 Benign neoplasm of transverse colon: Secondary | ICD-10-CM

## 2019-10-19 MED ORDER — SODIUM CHLORIDE 0.9 % IV SOLN: 500.0000 mL | Freq: Once | INTRAVENOUS | Status: DC

## 2019-10-19 NOTE — Progress Notes (Signed)
PT taken to PACU. Monitors in place. VSS. Report given to RN. 

## 2019-10-19 NOTE — Op Note (Addendum)
[place] Patient Name: James [name] Procedure Date: 10/19/2019 11:34 AM MRN: [id number] Endoscopist: Gatha Mayer , MD Age: 74 Referring MD:  Date of Birth: 09-Jul-1946 Gender: Male Account #: [id number] Procedure:                Colonoscopy Indications:              Surveillance: Personal history of adenomatous                            polyps on last colonoscopy > 3 years ago Medicines:                Propofol per Anesthesia, Monitored Anesthesia Care Procedure:                Pre-Anesthesia Assessment:                           - Prior to the procedure, a History and Physical                            was performed, and patient medications and                            allergies were reviewed. The patient's tolerance of                            previous anesthesia was also reviewed. The risks                            and benefits of the procedure and the sedation                            options and risks were discussed with the patient.                            All questions were answered, and informed consent                            was obtained. Prior Anticoagulants: The patient                            last took Eliquis (apixaban) 2 days prior to the                            procedure. ASA Grade Assessment: III - A patient                            with severe systemic disease. After reviewing the                            risks and benefits, the patient was deemed in                            satisfactory condition to undergo the procedure.  After obtaining informed consent, the colonoscope                            was passed under direct vision. Throughout the                            procedure, the patient's blood pressure, pulse, and                            oxygen saturations were monitored continuously. The                            Colonoscope was introduced through the anus and    advanced to the the cecum, identified by                            appendiceal orifice and ileocecal valve. The                            colonoscopy was performed without difficulty. The                            patient tolerated the procedure well. The quality                            of the bowel preparation was good. The ileocecal                            valve, appendiceal orifice, and rectum were                            photographed. The bowel preparation used was                            Miralax via split dose instruction. Scope In: 11:42:29 AM Scope Out: 12:01:27 PM Scope Withdrawal Time: 0 hours 16 minutes 30 seconds  Total Procedure Duration: 0 hours 18 minutes 58 seconds  Findings:                 The perianal and digital rectal examinations were                            normal.                           Three sessile polyps were found in the transverse                            colon and ascending colon. The polyps were 1 to 2                            mm in size. These polyps were removed with a cold                            biopsy  forceps. Resection and retrieval were                            complete. Verification of patient identification                            for the specimen was done. Estimated blood loss was                            minimal.                           Multiple small and large-mouthed diverticula were                            found in the entire colon.                           Internal hemorrhoids were found.                           The exam was otherwise without abnormality on                            direct and retroflexion views. Complications:            No immediate complications. Estimated Blood Loss:     Estimated blood loss was minimal. Impression:               - Three 1 to 2 mm polyps in the transverse colon                            and in the ascending colon, removed with a cold                             biopsy forceps. Resected and retrieved.                           - Diverticulosis in the entire examined colon.                           - Internal hemorrhoids.                           - The examination was otherwise normal on direct                            and retroflexion views. There were tattoos in                            ascending colon from marking of large polypectomy                            site 2017                           -  Personal history of colonic polyps. 2017 25 mm tv                            adenoma + other diminutive adenomas, 2018 3                            diminutive adenomas and no residual 25 mm polyp Recommendation:           - Patient has a contact number available for                            emergencies. The signs and symptoms of potential                            delayed complications were discussed with the                            patient. Return to normal activities tomorrow.                            Written discharge instructions were provided to the                            patient.                           - Resume Eliquis (apixaban) at prior dose tomorrow.                           - Repeat colonoscopy is recommended for                            surveillance. The colonoscopy date will be                            determined after pathology results from today's                            exam become available for review. Gatha Mayer, MD 10/19/2019 12:11:13 PM This report has been signed electronically.

## 2019-10-19 NOTE — Patient Instructions (Addendum)
I found and removed 3 tiny polyps today. Saw diverticulosis again. Hemorrhoids were swollen also.  I will let you know pathology results and when to have another routine colonoscopy by mail and/or My Chart.  Resume Eliquis tomorrow, please.  I appreciate the opportunity to care for you. Gatha Mayer, MD, FACG  YOU HAD AN ENDOSCOPIC PROCEDURE TODAY AT [facility]:   Refer to the procedure report that was given to you for any specific questions about what was found during the examination.  If the procedure report does not answer your questions, please call your gastroenterologist to clarify.  If you requested that your care partner not be given the details of your procedure findings, then the procedure report has been included in a sealed envelope for you to review at your convenience later.  YOU SHOULD EXPECT: Some feelings of bloating in the abdomen. Passage of more gas than usual.  Walking can help get rid of the air that was put into your GI tract during the procedure and reduce the bloating. If you had a lower endoscopy (such as a colonoscopy or flexible sigmoidoscopy) you may notice spotting of blood in your stool or on the toilet paper. If you underwent a bowel prep for your procedure, you may not have a normal bowel movement for a few days.  Please Note:  You might notice some irritation and congestion in your nose or some drainage.  This is from the oxygen used during your procedure.  There is no need for concern and it should clear up in a day or so.  SYMPTOMS TO REPORT IMMEDIATELY:   Following lower endoscopy (colonoscopy or flexible sigmoidoscopy):  Excessive amounts of blood in the stool  Significant tenderness or worsening of abdominal pains  Swelling of the abdomen that is new, acute  Fever of 100F or higher   For urgent or emergent issues, a gastroenterologist can be reached at any hour by calling [phone]. Do not use MyChart messaging for urgent  concerns.    DIET:  We do recommend a small meal at first, but then you may proceed to your regular diet.  Drink plenty of fluids but you should avoid alcoholic beverages for 24 hours.  ACTIVITY:  You should plan to take it easy for the rest of today and you should NOT DRIVE or use heavy machinery until tomorrow (because of the sedation medicines used during the test).    FOLLOW UP: Our staff will call the number listed on your records 48-72 hours following your procedure to check on you and address any questions or concerns that you may have regarding the information given to you following your procedure. If we do not reach you, we will leave a message.  We will attempt to reach you two times.  During this call, we will ask if you have developed any symptoms of COVID 19. If you develop any symptoms (ie: fever, flu-like symptoms, shortness of breath, cough etc.) before then, please call [phone].  If you test positive for Covid 19 in the 2 weeks post procedure, please call and report this information to [place].    If any biopsies were taken you will be contacted by phone or by letter within the next 1-3 weeks.  Please call us at [phone] if you have not heard about the biopsies in 3 weeks.    SIGNATURES/CONFIDENTIALITY: You and/or your care partner have signed paperwork which will be entered into your electronic medical record.  These  signatures attest to the fact that that the information above on your After Visit Summary has been reviewed and is understood.  Full responsibility of the confidentiality of this discharge information lies with you and/or your care-partner. 

## 2019-10-19 NOTE — Progress Notes (Signed)
Temp by JB, vitals by CW 

## 2019-10-19 NOTE — Progress Notes (Signed)
Called to room to assist during endoscopic procedure.  Patient ID and intended procedure confirmed with present staff. Received instructions for my participation in the procedure from the performing physician.  

## 2019-10-21 ENCOUNTER — Telehealth: Payer: Self-pay

## 2019-10-21 ENCOUNTER — Encounter: Payer: Self-pay | Admitting: Internal Medicine

## 2019-10-21 DIAGNOSIS — Z86010 Personal history of colonic polyps: Secondary | ICD-10-CM

## 2019-10-21 DIAGNOSIS — Z860101 Personal history of adenomatous and serrated colon polyps: Secondary | ICD-10-CM

## 2019-10-21 NOTE — Telephone Encounter (Signed)
  Follow up Call-  Call back number 10/19/2019  Post procedure Call Back phone  # [phone]  Permission to leave phone message Yes  Some recent data might be hidden     Patient questions:  Do you have a fever, pain , or abdominal swelling? No. Pain Score  0 *  Have you tolerated food without any problems? Yes.    Have you been able to return to your normal activities? Yes.    Do you have any questions about your discharge instructions: Diet   No. Medications  No. Follow up visit  No.  Do you have questions or concerns about your Care? No.  Actions: * If pain score is 4 or above: No action needed, pain <4.  1. Have you developed a fever since your procedure? no  2.   Have you had an respiratory symptoms (SOB or cough) since your procedure? no  3.   Have you tested positive for COVID 19 since your procedure no  4.   Have you had any family members/close contacts diagnosed with the COVID 19 since your procedure?  no   If yes to any of these questions please route to Joylene John, RN and Alphonsa Gin, Therapist, sports.

## 2019-10-28 ENCOUNTER — Other Ambulatory Visit: Payer: Self-pay

## 2019-10-28 MED ORDER — ATORVASTATIN CALCIUM 80 MG PO TABS: 80.0000 mg | ORAL_TABLET | Freq: Every day | ORAL | 3 refills | Status: DC

## 2019-11-05 NOTE — Telephone Encounter (Signed)
Pt seen 08/17/19.

## 2019-11-11 DIAGNOSIS — C44319 Basal cell carcinoma of skin of other parts of face: Secondary | ICD-10-CM | POA: Diagnosis not present

## 2019-11-25 ENCOUNTER — Other Ambulatory Visit: Payer: Self-pay

## 2019-11-25 ENCOUNTER — Ambulatory Visit (INDEPENDENT_AMBULATORY_CARE_PROVIDER_SITE_OTHER): Payer: Medicare Other | Admitting: Internal Medicine

## 2019-11-25 ENCOUNTER — Encounter: Payer: Self-pay | Admitting: Internal Medicine

## 2019-11-25 VITALS — BP 130/74 | HR 54 | Ht 72.0 in | Wt 267.5 lb

## 2019-11-25 DIAGNOSIS — I5032 Chronic diastolic (congestive) heart failure: Secondary | ICD-10-CM

## 2019-11-25 DIAGNOSIS — I712 Thoracic aortic aneurysm, without rupture, unspecified: Secondary | ICD-10-CM

## 2019-11-25 DIAGNOSIS — Z79899 Other long term (current) drug therapy: Secondary | ICD-10-CM | POA: Diagnosis not present

## 2019-11-25 DIAGNOSIS — I4819 Other persistent atrial fibrillation: Secondary | ICD-10-CM

## 2019-11-25 DIAGNOSIS — I2510 Atherosclerotic heart disease of native coronary artery without angina pectoris: Secondary | ICD-10-CM

## 2019-11-25 DIAGNOSIS — I10 Essential (primary) hypertension: Secondary | ICD-10-CM

## 2019-11-25 DIAGNOSIS — E785 Hyperlipidemia, unspecified: Secondary | ICD-10-CM

## 2019-11-25 NOTE — Patient Instructions (Addendum)
Medication Instructions:  Your physician recommends that you continue on your current medications as directed. Please refer to the Current Medication list given to you today.  *If you need a refill on your cardiac medications before your next appointment, please call your pharmacy*   Lab Work: FASTING LAB WORK IN 6 MONTHS - Your physician recommends that you return for lab work in: 6 MONTHS prior to CTA of the Chest.  - Will check CBC, Fasting LIPID, CMP, TSH. - - You will need to be fasting. Please do not have anything to eat or drink after midnight the morning you have the lab work. You may only have water or black coffee with no cream or sugar. - Please go to the [facility]. You will check in at the front desk to the right as you walk into the atrium. Valet Parking is offered if needed. - No appointment needed. You may go any day between 7 am and 6 pm.   If you have labs (blood work) drawn today and your tests are completely normal, you will receive your results only by: Marland Kitchen MyChart Message (if you have MyChart) OR . A paper copy in the mail If you have any lab test that is abnormal or we need to change your treatment, we will call you to review the results.   Testing/Procedures: 1- PFT's - Your physician has recommended that you have a pulmonary function test. Pulmonary Function Tests are a group of tests that measure how well air moves in and out of your lungs.  [facility] [address], [place],  [address] [phone]   2- CTA Chest and aorta - Non-Cardiac CT Angiography (CTA), is a special type of CT scan that uses a computer to produce multi-dimensional views of major blood vessels throughout the body. In CT angiography, a contrast material is injected through an IV to help visualize the blood vessels  LOCATION: ____[facility]_______________ DATE/TIME: ____10/7/21 ARRIVE AT 8:45 AM (for 9 am procedure time) INSTRUCTIONS:   Water only  within 4 hours prior to CTA.   * Make sure you've had the lab work listed above within the week before the CTA.  Follow-Up: At [facility], you and your health needs are our priority.  As part of our continuing mission to provide you with exceptional heart care, we have created designated Provider Care Teams.  These Care Teams include your primary Cardiologist (physician) and Advanced Practice Providers (APPs -  Physician Assistants and Nurse Practitioners) who all work together to provide you with the care you need, when you need it.  We recommend signing up for the patient portal called "MyChart".  Sign up information is provided on this After Visit Summary.  MyChart is used to connect with patients for Virtual Visits (Telemedicine).  Patients are able to view lab/test results, encounter notes, upcoming appointments, etc.  Non-urgent messages can be sent to your provider as well.   To learn more about what you can do with MyChart, go to [email or web address].    Your next appointment:   6 month(s)  The format for your next appointment:   In Person  Provider:    You may see Nelva Bush, MD or one of the following Advanced Practice Providers on your designated Care Team:    Murray Hodgkins, NP  Christell Faith, PA-C  Marrianne Mood, PA-C    Pulmonary Function Tests Pulmonary function tests (PFTs) are used to measure how well your lungs work, find out  what is causing your lung problems, and figure out the best treatment for you. You may have PFTs:  When you have an illness involving the lungs.  To follow changes in your lung function over time if you have a chronic lung disease.  If you are an Nature conservation officer. This checks the effects of being exposed to chemicals over a long period of time.  To check lung function before having surgery or other procedures.  To check your lungs if you smoke.  To check if prescribed medicines or treatments are helping your  lungs. Your results will be compared to the expected lung function of someone with healthy lungs who is similar to you in:  Age.  Gender.  Height.  Weight.  Race or ethnicity. This is done to show how your lungs compare to normal lung function (percent predicted). This is how your health care provider knows if your lung function is normal or not. If you have had PFTs done before, your health care provider will compare your current results with past results. This shows if your lung function is better, worse, or the same as before. Tell a health care provider about:  Any allergies you have.  All medicines you are taking, including inhaler or nebulizer medicines, vitamins, herbs, eye drops, creams, and over-the-counter medicines.  Any blood disorders you have.  Any surgeries you have had, especially recent eye surgery, abdominal surgery, or chest surgery. These can make PFTs difficult or unsafe.  Any medical conditions you have, including chest pain or heart problems, tuberculosis, or respiratory infections such as pneumonia, a cold, or the flu.  Any fear of being in closed spaces (claustrophobia). Some of your tests may be in a closed space. What are the risks? Generally, this is a safe procedure. However, problems may occur, including:  Light-headedness due to over-breathing (hyperventilation).  An asthma attack from deep breathing.  A collapsed lung. What happens before the procedure?  Take over-the-counter and prescription medicines only as told by your health care provider. If you take inhaler or nebulizer medicines, ask your health care provider which medicines you should take on the day of your testing. Some inhaler medicines may interfere with PFTs if they are taken shortly before the tests.  Follow your health care provider's instructions on eating and drinking restrictions. This may include avoiding eating large meals and drinking alcohol before the testing.  Do not use  any products that contain nicotine or tobacco, such as cigarettes and e-cigarettes. If you need help quitting, ask your health care provider.  Wear comfortable clothing that will not interfere with breathing. What happens during the procedure?   You will be given a soft nose clip to wear. This is done so all of your breaths will go through your mouth instead of your nose.  You will be given a germ-free (sterile) mouthpiece. It will be attached to a machine that measures your breathing (spirometer).  You will be asked to do various breathing maneuvers. The maneuvers will be done by breathing in (inhaling) and breathing out (exhaling). You may be asked to repeat the maneuvers several times before the testing is done.  It is important to follow the instructions exactly to get accurate results. Make sure to blow as hard and as fast as you can when you are told to do so.  You may be given a medicine that makes the small air passages in your lungs larger (bronchodilator) after testing has been done. This medicine will make  it easier for you to breathe.  The tests will be repeated after the bronchodilator has taken effect.  You will be monitored carefully during the procedure for faintness, dizziness, trouble breathing, or any other problems. The procedure may vary among health care providers and hospitals. What happens after the procedure?  It is up to you to get your test results. Ask your health care provider, or the department that is doing the tests, when your results will be ready. After you have received your test results, talk with your health care provider about treatment options, if necessary. Summary  Pulmonary function tests (PFTs) are used to measure how well your lungs work, find out what is causing your lung problems, and figure out the best treatment for you.  Wear comfortable clothing that will not interfere with breathing.  It is up to you to get your test results. After you  have received them, talk with your health care provider about treatment options, if necessary. This information is not intended to replace advice given to you by your health care provider. Make sure you discuss any questions you have with your health care provider. Document Revised: 07/20/2016 Document Reviewed: 06/14/2016 Elsevier Patient Education  2020 [employer].

## 2019-11-25 NOTE — Progress Notes (Signed)
Follow-up Outpatient Visit Date: 11/25/2019  Primary Care Provider: Tonia Ghent, MD [place] [address]  Chief Complaint: Follow-up coronary artery disease, HFpEF, and atrial fibrillation  HPI:  James [name] is a 74 y.o. male with history of coronary artery diseasestatus post PCI to the RCA (05/2018), HFpEF, persistent atrial fibrillation, hypertension, hyperlipidemia, hypothyroidism, and obesity, who presents for follow-up of coronary artery disease.  We last spoke via virtual visit in late January to follow-up shortness of breath.  James [name] was feeling well and noted only 2 episodes of dyspnea while doing chores around the house.  We agreed to decrease amiodarone to 100 mg daily and obtain PFTs to exclude amiodarone induced pulmonary toxicity.  Unfortunately, it does not appear that PFTs have been performed yet.  Today, James [name] reports that he is feeling well.  He has chronic exertional dyspnea, unchanged from prior visits.  He denies further chest pain as well as palpitations, lightheadedness, and edema.  He notes his home heart rates are typically in the mid to upper 50s.  He remains on apixaban without bleeding.  --------------------------------------------------------------------------------------------------  Past Medical History:  Diagnosis Date  . (HFpEF) heart failure with preserved ejection fraction ([place])    a. 05/2018 Echo: EF 55-60%, gr2 DD.  Marland Kitchen Acute lower GI bleeding after colonoscopy and polypectomy, resolved 12/12/2015  . Ascending aortic aneurysm ([place])    a. 02/2018 Echo: mildly dil Ao root/asc ao/arch; b. 05/2018 CTA Chest: 4.7cm fusiform aneurysm of Asc Ao.  Marland Kitchen CAD (coronary artery disease)    a. 05/2018 Cath/PCI: LM nl,LAD 30p, 90/99d/apical, LCX 31m OM1/2/3 min irregs, RCA 85p (3.5x18 [place].  . Cardiac murmur    a. 02/2018 Echo: EF 60-65%, no rwma, mild AI, mildly dil Ao root/Asc Ao/Arch, Mild MR, mildly dil LA. Nl RV fxn.  . Cataract     bilateral surgery to remove  . CHF (congestive heart failure) ([place]   . Colon polyp   . Constipation   . Essential hypertension   . Hemorrhoids   . Hepatitis    self resolved, likely food exposure, 1974.   .Marland KitchenHistory of cardiovascular stress test    a. 02/2018 Myoview: EF 55-65%, small/mild apical defect w/ nl wall motion ->attenuation artifact. No ischemia. Low risk study.  .Marland KitchenHx of adenomatous colonic polyps 12/05/2015  . Hyperlipidemia   . Hypothyroidism   . Mitral regurgitation    a. 110/2019 Echo: EF 55-60%. Gr2 DD. Triv AI. Ao root 37m Mild MR. Mod dil LA, mildly dil RA.  . Osteoarthritis   . Persistent atrial fibrillation ([place]   a. Dx 02/2018. CHA2DS2VASc = 2-->Eliquis; b. 03/2018 DCCV (200J); c. 03/2018 recurrent Afib-->flecainide started 04/2018;  d. 05/06/2018 s/p successful DCCV - 200J x 2-->on amio.  . Skin cancer    basal cell, R ear, MOHS   Past Surgical History:  Procedure Laterality Date  . BROW LIFT Bilateral 10/23/2016   Procedure: BLEPHAROPLASTY upper eyelid with excess skin;  Surgeon: AmKarle StarchMD;  [facility] Service: Ophthalmology;  Laterality: Bilateral;  MAC  . CARDIOVERSION N/A 03/18/2018   Procedure: CARDIOVERSION;  Surgeon: EnNelva BushMD;  [facility];  Service: Cardiovascular;  Laterality: N/A;  . CARDIOVERSION N/A 05/06/2018   Procedure: CARDIOVERSION;  Surgeon: EnNelva BushMD;  Location[facility];  Service: Cardiovascular;  Laterality: N/A;  . CATARACT EXTRACTION  1986   OD  . CATARACT EXTRACTION Bilateral 1995   x 2 for  right and left   . COLONOSCOPY    . CORONARY STENT INTERVENTION N/A 05/13/2018   Procedure: CORONARY STENT INTERVENTION;  Surgeon: Nelva Bush, MD;  Location: [place] [facility];  Service: Cardiovascular;  Laterality: N/A;  . ECTROPION REPAIR Bilateral 10/23/2016   Procedure: REPAIR OF ECTROPION sutures, extensive;  Surgeon: Karle Starch, MD;  Location: [facility];  Service:  Ophthalmology;  Laterality: Bilateral;  . LIPOMA EXCISION  06/1997   left neck ([place])  . MOHS SURGERY Right 2013   behind right ear  . RIGHT/LEFT HEART CATH AND CORONARY ANGIOGRAPHY N/A 05/12/2018   Procedure: RIGHT/LEFT HEART CATH AND CORONARY ANGIOGRAPHY;  Surgeon: Wellington Hampshire, MD;  Location: [place] [facility];  Service: Cardiovascular;  Laterality: N/A;  . TONSILLECTOMY    . VASECTOMY  1982    Current Meds  Medication Sig  . Acetaminophen (TYLENOL 8 HOUR PO) Take 200 mg by mouth. Taking PRN  . amiodarone (PACERONE) 100 MG tablet TAKE 1 TABLET BY MOUTH EVERY DAY  . apixaban (ELIQUIS) 5 MG TABS tablet TAKE 1 TABLET(5 MG) BY MOUTH TWICE DAILY  . atorvastatin (LIPITOR) 80 MG tablet Take 1 tablet (80 mg total) by mouth daily at 6 PM.  . DHA-EPA-Coenzyme Q10-Vitamin E (CO Q-10 VITAMIN E FISH OIL PO) Take 1 capsule by mouth daily.  Marland Kitchen doxazosin (CARDURA) 4 MG tablet Take 1 tablet (4 mg total) by mouth daily after breakfast.  . doxycycline (VIBRA-TABS) 100 MG tablet Take 1 tablet (100 mg total) by mouth 2 (two) times daily.  Marland Kitchen ezetimibe (ZETIA) 10 MG tablet Take 1 tablet (10 mg total) by mouth daily.  . finasteride (PROSCAR) 5 MG tablet Take 1 tablet (5 mg total) by mouth daily.  . furosemide (LASIX) 20 MG tablet Take 1 tablet (20 mg total) by mouth once a day.  . levothyroxine (SYNTHROID) 150 MCG tablet Take 1 tablet (150 mcg total) by mouth daily before breakfast.  . Multiple Vitamin (MULTIVITAMIN WITH MINERALS) TABS tablet Take 1 tablet by mouth daily. Senior Multivitamin  . psyllium (METAMUCIL) 58.6 % powder Take 1 packet by mouth daily.   . ramipril (ALTACE) 10 MG capsule Take 1 capsule (10 mg total) by mouth daily.  . Vitamins/Minerals TABS Take by mouth.  . [DISCONTINUED] amiodarone (PACERONE) 200 MG tablet Take 200 mg by mouth daily.    Allergies: Sulfonamide derivatives  Social History   Tobacco Use  . Smoking status: Former Smoker    Packs/day: 2.00    Years:  20.00    Pack years: 40.00    Types: Cigarettes    Quit date: 08/07/1983    Years since quitting: 36.3  . Smokeless tobacco: Never Used  . Tobacco comment: quit over 30 years ago  Substance Use Topics  . Alcohol use: Yes    Alcohol/week: 1.0 standard drinks    Types: 1 Glasses of wine per week    Comment: per day  . Drug use: No    Family History  Problem Relation Age of Onset  . Heart disease Paternal Grandfather        MI, old age  . Stroke Mother   . Lung cancer Maternal Grandfather        smoker  . Stroke Maternal Grandfather   . Colon cancer Neg Hx   . Colon polyps Neg Hx   . Esophageal cancer Neg Hx   . Rectal cancer Neg Hx   . Stomach cancer Neg Hx   . Bladder Cancer Neg  Hx   . Prostate cancer Neg Hx     Review of Systems: A 12-system review of systems was performed and was negative except as noted in the HPI.  --------------------------------------------------------------------------------------------------  Physical Exam: BP 130/74 (BP Location: Left Arm, Patient Position: Sitting, Cuff Size: Large)   Pulse (!) 54   Ht 6' (1.829 m)   Wt 267 lb 8 oz (121.3 kg)   SpO2 98%   BMI 36.28 kg/m   General: NAD. Neck: No JVD or HJR. Lungs: Clear to auscultation bilaterally without wheezes or crackles. Heart: Bradycardic but regular without murmurs, rubs, or gallops. Abdomen: Soft, nontender, nondistended. Extremities: No lower extremity edema.  EKG: Sinus bradycardia with left axis deviation.  Otherwise, no significant abnormality.  Lab Results  Component Value Date   WBC 7.5 06/18/2019   HGB 13.6 06/18/2019   HCT 40.4 06/18/2019   MCV 94.2 06/18/2019   PLT 150.0 06/18/2019    Lab Results  Component Value Date   NA 139 08/10/2019   K 4.4 08/10/2019   CL 104 08/10/2019   CO2 26 08/10/2019   BUN 16 08/10/2019   CREATININE 1.43 08/10/2019   GLUCOSE 87 08/10/2019   ALT 14 06/18/2019    Lab Results  Component Value Date   CHOL 115 06/18/2019    HDL 43.40 06/18/2019   LDLCALC 55 06/18/2019   TRIG 85.0 06/18/2019   CHOLHDL 3 06/18/2019    --------------------------------------------------------------------------------------------------  ASSESSMENT AND PLAN: Coronary artery disease: James [name] is not had any symptoms to suggest worsening coronary insufficiency.  We will continue his current medications for secondary prevention.  Given history of atrial fibrillation, he is on apixaban in lieu of aspirin.  Persistent atrial fibrillation: James [name] has not had any symptoms to suggest recurrent atrial fibrillation.  We will continue with amiodarone 100 mg daily and work towards PFTs when possible.  James [name] will remain on indefinite anticoagulation with apixaban 5 mg twice daily.  Plan to check TFTs and LFTs prior to CTA later this year.  Thoracic aortic aneurysm: No symptoms reported.  Plan for follow-up CTA of the chest in September or October.  Patient advised to avoid fluoroquinolones.  Continue with lipid and blood pressure control.  Chronic HFpEF: James [name] appears euvolemic with stable NYHA class II symptoms.  As above, we will try to facilitate PFTs to ensure he has not developed amiodarone related lung toxicity.  Hyperlipidemia: LDL well controlled.  Continue atorvastatin and ezetimibe.  Plan to check fasting lipid panel and LFTs prior to CTA later this year.  Hypertension: Blood pressure upper normal today.  Continue current doses of doxazosin and ramipril.  Sinus bradycardia: Asymptomatic with normal chronotropic response during ambulation in the office today (heart rate increased to 80 bpm).  Patient is not on any beta-blockers or calcium channel blockers at this time.  Follow-up: Return to clinic in 6 months.  Nelva Bush, MD 11/25/2019 11:18 AM

## 2019-11-26 ENCOUNTER — Other Ambulatory Visit: Payer: Self-pay | Admitting: *Deleted

## 2019-11-26 DIAGNOSIS — I5032 Chronic diastolic (congestive) heart failure: Secondary | ICD-10-CM

## 2019-11-26 DIAGNOSIS — Z79899 Other long term (current) drug therapy: Secondary | ICD-10-CM

## 2019-11-30 ENCOUNTER — Other Ambulatory Visit: Payer: Self-pay | Admitting: Internal Medicine

## 2019-12-11 ENCOUNTER — Other Ambulatory Visit
Admission: RE | Admit: 2019-12-11 | Discharge: 2019-12-11 | Disposition: A | Discharge status: Home / Self Care | Payer: Medicare Other | Source: Ambulatory Visit | Attending: Internal Medicine | Admitting: Internal Medicine

## 2019-12-11 DIAGNOSIS — Z20822 Contact with and (suspected) exposure to covid-19: Secondary | ICD-10-CM | POA: Insufficient documentation

## 2019-12-11 DIAGNOSIS — Z01812 Encounter for preprocedural laboratory examination: Secondary | ICD-10-CM | POA: Diagnosis not present

## 2019-12-11 LAB — SARS CORONAVIRUS 2 (TAT 6-24 HRS): SARS Coronavirus 2: NEGATIVE

## 2019-12-14 ENCOUNTER — Other Ambulatory Visit: Payer: Self-pay

## 2019-12-14 ENCOUNTER — Ambulatory Visit: Payer: Medicare Other | Admitting: Internal Medicine

## 2019-12-14 DIAGNOSIS — I5032 Chronic diastolic (congestive) heart failure: Secondary | ICD-10-CM

## 2019-12-14 DIAGNOSIS — Z79899 Other long term (current) drug therapy: Secondary | ICD-10-CM | POA: Diagnosis not present

## 2019-12-14 LAB — PULMONARY FUNCTION TEST
DL/VA % pred: 88 %
DL/VA: 3.52 ml/min/mmHg/L
DLCO unc % pred: 101 %
DLCO unc: 27.7 ml/min/mmHg
FEF 25-75 Post: 3.54 L/sec
FEF 25-75 Pre: 3.32 L/sec
FEF2575-%Change-Post: 6 %
FEF2575-%Pred-Post: 138 %
FEF2575-%Pred-Pre: 129 %
FEV1-%Change-Post: 1 %
FEV1-%Pred-Post: 111 %
FEV1-%Pred-Pre: 109 %
FEV1-Post: 3.83 L
FEV1-Pre: 3.76 L
FEV1FVC-%Change-Post: 2 %
FEV1FVC-%Pred-Pre: 105 %
FEV6-%Change-Post: 0 %
FEV6-%Pred-Post: 109 %
FEV6-%Pred-Pre: 109 %
FEV6-Post: 4.85 L
FEV6-Pre: 4.87 L
FEV6FVC-%Change-Post: 0 %
FEV6FVC-%Pred-Post: 105 %
FEV6FVC-%Pred-Pre: 105 %
FVC-%Change-Post: 0 %
FVC-%Pred-Post: 103 %
FVC-%Pred-Pre: 104 %
FVC-Post: 4.85 L
FVC-Pre: 4.89 L
Post FEV1/FVC ratio: 79 %
Post FEV6/FVC ratio: 100 %
Pre FEV1/FVC ratio: 77 %
Pre FEV6/FVC Ratio: 100 %
RV % pred: 115 %
RV: 3.01 L
TLC % pred: 105 %
TLC: 7.86 L

## 2019-12-14 NOTE — Progress Notes (Signed)
PFT completed today.  

## 2020-01-05 ENCOUNTER — Other Ambulatory Visit: Payer: Self-pay | Admitting: *Deleted

## 2020-01-05 MED ORDER — LEVOTHYROXINE SODIUM 150 MCG PO TABS: 150.0000 ug | ORAL_TABLET | Freq: Every day | ORAL | 1 refills | Status: DC

## 2020-02-15 ENCOUNTER — Telehealth: Payer: Self-pay | Admitting: Internal Medicine

## 2020-02-15 NOTE — Telephone Encounter (Signed)
Patient has been been constipated and his stools are dark, but not black. He is having to strain and goes days between BMs.   He will try Miralax 1-2 times a day and titrate for effect.  He will call back next week if the stools are still dark.

## 2020-02-16 ENCOUNTER — Other Ambulatory Visit: Payer: Self-pay

## 2020-02-16 MED ORDER — FUROSEMIDE 20 MG PO TABS: ORAL_TABLET | ORAL | 0 refills | Status: DC

## 2020-03-07 NOTE — Telephone Encounter (Signed)
Pt is requesting a call back from a nurse to discuss continuous complications with his stools

## 2020-03-07 NOTE — Telephone Encounter (Signed)
Patient reports that he has still been having trouble having a BM.  Stools are soft with the Miralax, but he is having trouble "pushing it out".  He will come in and see Dr. Havery Moros in Sept to discuss.  He thanked me for the call

## 2020-03-10 DIAGNOSIS — Z0120 Encounter for dental examination and cleaning without abnormal findings: Secondary | ICD-10-CM | POA: Diagnosis not present

## 2020-03-14 DIAGNOSIS — D2261 Melanocytic nevi of right upper limb, including shoulder: Secondary | ICD-10-CM | POA: Diagnosis not present

## 2020-03-14 DIAGNOSIS — Z85828 Personal history of other malignant neoplasm of skin: Secondary | ICD-10-CM | POA: Diagnosis not present

## 2020-03-14 DIAGNOSIS — D2262 Melanocytic nevi of left upper limb, including shoulder: Secondary | ICD-10-CM | POA: Diagnosis not present

## 2020-03-14 DIAGNOSIS — Z85820 Personal history of malignant melanoma of skin: Secondary | ICD-10-CM | POA: Diagnosis not present

## 2020-03-17 ENCOUNTER — Other Ambulatory Visit: Payer: Self-pay | Admitting: Cardiovascular Disease

## 2020-04-07 ENCOUNTER — Encounter: Payer: Self-pay | Admitting: Gastroenterology

## 2020-04-07 ENCOUNTER — Other Ambulatory Visit (INDEPENDENT_AMBULATORY_CARE_PROVIDER_SITE_OTHER): Payer: Medicare Other

## 2020-04-07 ENCOUNTER — Ambulatory Visit: Payer: Medicare Other | Admitting: Gastroenterology

## 2020-04-07 VITALS — BP 142/94 | HR 76 | Ht 71.25 in | Wt 265.1 lb

## 2020-04-07 DIAGNOSIS — R195 Other fecal abnormalities: Secondary | ICD-10-CM | POA: Diagnosis not present

## 2020-04-07 DIAGNOSIS — K5900 Constipation, unspecified: Secondary | ICD-10-CM

## 2020-04-07 LAB — CBC WITH DIFFERENTIAL/PLATELET
Basophils Absolute: 0 10*3/uL (ref 0.0–0.1)
Basophils Relative: 0.5 % (ref 0.0–3.0)
Eosinophils Absolute: 0.1 10*3/uL (ref 0.0–0.7)
Eosinophils Relative: 0.9 % (ref 0.0–5.0)
HCT: 43.1 % (ref 39.0–52.0)
Hemoglobin: 14.9 g/dL (ref 13.0–17.0)
Lymphocytes Relative: 37.3 % (ref 12.0–46.0)
Lymphs Abs: 3.4 10*3/uL (ref 0.7–4.0)
MCHC: 34.6 g/dL (ref 30.0–36.0)
MCV: 91.8 fl (ref 78.0–100.0)
Monocytes Absolute: 0.7 10*3/uL (ref 0.1–1.0)
Monocytes Relative: 7.7 % (ref 3.0–12.0)
Neutro Abs: 4.8 10*3/uL (ref 1.4–7.7)
Neutrophils Relative %: 53.6 % (ref 43.0–77.0)
Platelets: 146 10*3/uL — ABNORMAL LOW (ref 150.0–400.0)
RBC: 4.7 Mil/uL (ref 4.22–5.81)
RDW: 13.8 % (ref 11.5–15.5)
WBC: 9 10*3/uL (ref 4.0–10.5)

## 2020-04-07 LAB — BUN: BUN: 16 mg/dL (ref 6–23)

## 2020-04-07 MED ORDER — POLYETHYLENE GLYCOL 3350 17 G PO PACK
17.0000 g | PACK | Freq: Two times a day (BID) | ORAL | 0 refills | Status: DC
Start: 2020-04-07 — End: 2022-05-25

## 2020-04-07 NOTE — Patient Instructions (Addendum)
If you are age 73 or older, your body mass index should be between 23-30. Your Body mass index is 36.72 kg/m. If this is out of the aforementioned range listed, please consider follow up with your Primary Care Provider.  If you are age 55 or younger, your body mass index should be between 19-25. Your Body mass index is 36.72 kg/m. If this is out of the aformentioned range listed, please consider follow up with your Primary Care Provider.   Please go to the lab in the basement of our building to have lab work done as you leave today. Hit "B" for basement when you get on the elevator.  When the doors open the lab is on your left.  We will call you with the results. Thank you.  Due to recent changes in healthcare laws, you may see the results of your imaging and laboratory studies on MyChart before your provider has had a chance to review them.  We understand that in some cases there may be results that are confusing or concerning to you. Not all laboratory results come back in the same time frame and the provider may be waiting for multiple results in order to interpret others.  Please give [place] 48 hours in order for your provider to thoroughly review all the results before contacting the office for clarification of your results.   Please discontinue Metamucil.  Please purchase the following medications over the counter and take as directed: Miralax: Take as directed twice a day.  Increase or decrease as needed  Please call in 2 weeks and give [place] an update on your symptoms.  Thank you for entrusting me with your care and for choosing [facility], Dr. Ponemah Cellar

## 2020-04-07 NOTE — Progress Notes (Signed)
HPI :  74 year old male with a history of CAD, CHF, A. fib, anticoagulated with Eliquis, here for a follow-up visit with our office for altered bowel habits.  He normally follows with Dr. Carlean Purl of our practice, booked with me today due to availability.  He states for the past 6 weeks or so he has had changes in his bowel habits.  He has been constipated, passing hard stools and straining to have a bowel movement which has been bothering him.  He normally has a bowel movement every other day, has been going once every few days and struggling to relieve himself.  He states during this time his stool diameter was thinner than usual, and was darker.  He states stool is generally brown, but it was having components of dark brown to possibly black stool to it.  Denied any red blood in the stool.  He has denied any changes in his medication since he has noticed this.  He is not using any iron, no Pepto-Bismol.  He denies any food ingestions that could have changed his stool color.  He does not use any routine NSAID use.  He has felt a bit bloated with his constipation but he does not have any abdominal pains.  He denies any nausea or vomiting, no reflux symptoms, no upper tract symptoms.  He has tried increasing his Metamucil to twice daily, this has helped soften the stool a little bit and he is also using MiraLAX as needed but not using much of that.  He most recently had a colonoscopy with Dr. Carlean Purl about 6 months ago, he had multiple diverticuli noted and internal hemorrhoids and a few small polyps, no high risk lesions.   Colonoscopy 10/19/19 - Dr. Carlean Purl -  The perianal and digital rectal examinations were normal. - Three sessile polyps were found in the transverse colon and ascending colon. The polyps were 1 to 2 mm in size. These polyps were removed with a cold biopsy forceps. Resection and retrieval were complete. Verification of patient identification for the specimen was done. Estimated blood  loss was minimal. - Multiple small and large-mouthed diverticula were found in the entire colon. - Internal hemorrhoids were found. - The exam was otherwise without abnormality on direct and retroflexion views.  Surgical [P], colon, transverse x 2, and ascending x 1, polyp (3) - TUBULAR ADENOMA(S). - HIGH GRADE DYSPLASIA IS NOT IDENTIFIED.  Repeat in 3 years   Echo 06/19/19 - EF 55-60%  PFTs normal 12/14/19   Past Medical History:  Diagnosis Date  . (HFpEF) heart failure with preserved ejection fraction ([place])    a. 05/2018 Echo: EF 55-60%, gr2 DD.  Marland Kitchen Acute lower GI bleeding after colonoscopy and polypectomy, resolved 12/12/2015  . Ascending aortic aneurysm ([place])    a. 02/2018 Echo: mildly dil Ao root/asc ao/arch; b. 05/2018 CTA Chest: 4.7cm fusiform aneurysm of Asc Ao.  Marland Kitchen CAD (coronary artery disease)    a. 05/2018 Cath/PCI: LM nl,LAD 30p, 90/99d/apical, LCX 1m OM1/2/3 min irregs, RCA 85p (3.5x18 [place].  . Cardiac murmur    a. 02/2018 Echo: EF 60-65%, no rwma, mild AI, mildly dil Ao root/Asc Ao/Arch, Mild MR, mildly dil LA. Nl RV fxn.  . Cataract    bilateral surgery to remove  . CHF (congestive heart failure) ([place]   . Colon polyp   . Constipation   . Essential hypertension   . Hemorrhoids   . Hepatitis    self resolved, likely food exposure, 1974.   .Marland Kitchen  History of cardiovascular stress test    a. 02/2018 Myoview: EF 55-65%, small/mild apical defect w/ nl wall motion ->attenuation artifact. No ischemia. Low risk study.  Marland Kitchen Hx of adenomatous colonic polyps 12/05/2015  . Hyperlipidemia   . Hypothyroidism   . Mitral regurgitation    a. 110/2019 Echo: EF 55-60%. Gr2 DD. Triv AI. Ao root 84m. Mild MR. Mod dil LA, mildly dil RA.  . Osteoarthritis   . Persistent atrial fibrillation ([place]    a. Dx 02/2018. CHA2DS2VASc = 2-->Eliquis; b. 03/2018 DCCV (200J); c. 03/2018 recurrent Afib-->flecainide started 04/2018;  d. 05/06/2018 s/p successful DCCV - 200J x 2-->on amio.  . Skin cancer      basal cell, R ear, MOHS     Past Surgical History:  Procedure Laterality Date  . BROW LIFT Bilateral 10/23/2016   Procedure: BLEPHAROPLASTY upper eyelid with excess skin;  Surgeon: AKarle Starch MD;  [place] [facility]  Service: Ophthalmology;  Laterality: Bilateral;  MAC  . CARDIOVERSION N/A 03/18/2018   Procedure: CARDIOVERSION;  Surgeon: ENelva Bush MD;  [place] [facility];  Service: Cardiovascular;  Laterality: N/A;  . CARDIOVERSION N/A 05/06/2018   Procedure: CARDIOVERSION;  Surgeon: ENelva Bush MD;  [facility];  Service: Cardiovascular;  Laterality: N/A;  . CATARACT EXTRACTION  1986   OD  . CATARACT EXTRACTION Bilateral 1995   x 2 for right and left   . COLONOSCOPY    . CORONARY STENT INTERVENTION N/A 05/13/2018   Procedure: CORONARY STENT INTERVENTION;  Surgeon: ENelva Bush MD;  [facility];  Service: Cardiovascular;  Laterality: N/A;  . ECTROPION REPAIR Bilateral 10/23/2016   Procedure: REPAIR OF ECTROPION sutures, extensive;  Surgeon: AKarle Starch MD;  [place] [facility]  Service: Ophthalmology;  Laterality: Bilateral;  . LIPOMA EXCISION  06/1997   left neck ([place])  . MOHS SURGERY Right 2013   behind right ear  . RIGHT/LEFT HEART CATH AND CORONARY ANGIOGRAPHY N/A 05/12/2018   Procedure: RIGHT/LEFT HEART CATH AND CORONARY ANGIOGRAPHY;  Surgeon: AWellington Hampshire MD;  [facility];  Service: Cardiovascular;  Laterality: N/A;  . TONSILLECTOMY    . VASECTOMY  1982   Family History  Problem Relation Age of Onset  . Heart disease Paternal Grandfather        MI, old age  . Stroke Mother   . Lung cancer Maternal Grandfather        smoker  . Stroke Maternal Grandfather   . Colon cancer Neg Hx   . Colon polyps Neg Hx   . Esophageal cancer Neg Hx   . Rectal cancer Neg Hx   . Stomach cancer Neg Hx   . Bladder Cancer Neg Hx   . Prostate cancer Neg Hx    Social History   Tobacco Use  .  Smoking status: Former Smoker    Packs/day: 2.00    Years: 20.00    Pack years: 40.00    Types: Cigarettes    Quit date: 08/07/1983    Years since quitting: 36.6  . Smokeless tobacco: Never Used  . Tobacco comment: quit over 30 years ago  Vaping Use  . Vaping Use: Never used  Substance Use Topics  . Alcohol use: Yes    Alcohol/week: 1.0 standard drink    Types: 1 Glasses of wine per week    Comment: per day  . Drug use: No   Current Outpatient Medications  Medication Sig Dispense Refill  . Acetaminophen (TYLENOL  8 HOUR PO) Take 200 mg by mouth. Taking PRN    . amiodarone (PACERONE) 100 MG tablet TAKE 1 TABLET BY MOUTH EVERY DAY 30 tablet 6  . apixaban (ELIQUIS) 5 MG TABS tablet TAKE 1 TABLET(5 MG) BY MOUTH TWICE DAILY 180 tablet 3  . atorvastatin (LIPITOR) 80 MG tablet Take 1 tablet (80 mg total) by mouth daily at 6 PM. 90 tablet 3  . DHA-EPA-Coenzyme Q10-Vitamin E (CO Q-10 VITAMIN E FISH OIL PO) Take 1 capsule by mouth daily.    Marland Kitchen doxazosin (CARDURA) 4 MG tablet Take 1 tablet (4 mg total) by mouth daily after breakfast. 90 tablet 3  . doxycycline (VIBRA-TABS) 100 MG tablet Take 1 tablet (100 mg total) by mouth 2 (two) times daily. 20 tablet 0  . ezetimibe (ZETIA) 10 MG tablet TAKE 1 TABLET BY MOUTH DAILY. GENERIC EQUIVALENT FOR ZETIA. 90 tablet 0  . finasteride (PROSCAR) 5 MG tablet Take 1 tablet (5 mg total) by mouth daily. 90 tablet 3  . furosemide (LASIX) 20 MG tablet Take 1 tablet (20 mg total) by mouth once a day. 90 tablet 0  . levothyroxine (SYNTHROID) 150 MCG tablet Take 1 tablet (150 mcg total) by mouth daily before breakfast. 90 tablet 1  . Multiple Vitamin (MULTIVITAMIN WITH MINERALS) TABS tablet Take 1 tablet by mouth daily. Senior Multivitamin    . psyllium (METAMUCIL) 58.6 % powder Take 1 packet by mouth daily.     . ramipril (ALTACE) 10 MG capsule Take 1 capsule (10 mg total) by mouth daily. 90 capsule 3  . Vitamins/Minerals TABS Take by mouth.     No current  facility-administered medications for this visit.   Allergies  Allergen Reactions  . Sulfonamide Derivatives Rash     Review of Systems: All systems reviewed and negative except where noted in HPI.     Physical Exam: BP (!) 142/94 (BP Location: Left Arm, Patient Position: Sitting, Cuff Size: Normal)   Pulse 76   Ht 5' 11.25" (1.81 m) Comment: height measured without shoes  Wt 265 lb 2 oz (120.3 kg)   BMI 36.72 kg/m  Constitutional: Pleasant,well-developed, male in no acute distress. Abdominal: Soft, nondistended, nontender.  There are no masses palpable.  DRE - brown stool in vault, no mass lesions Extremities: no edema Lymphadenopathy: No cervical adenopathy noted. Neurological: Alert and oriented to person place and time. Skin: Skin is warm and dry. No rashes noted. Psychiatric: Normal mood and affect. Behavior is normal.   ASSESSMENT AND PLAN: 74 year old male here for reassessment the following:  Constipation / change in stool color - as above, 6 weeks worth of bowel changes to include constipation and change in color.  He does not have typical symptoms of melena, not convinced he is having GI bleeding causing the stool change, that would not be expected to make him constipated,   He has brown stool in the rectal vault today without any sign of bleeding.  Given his symptoms have been ongoing for several weeks, will check CBC and a BUN to make sure normal and no anemia.  He is using multiple doses of fiber a day and still not having complete relief of constipation, I think this is probably contributing to his bloating.  Recommend we stop the fiber altogether, will treat his constipation with MiraLAX starting at 1 dose twice daily and titrate up or down as needed. I reassured him that his last colonoscopy looked good. I asked him to message [place] in a couple weeks  to see how he is doing.  If he has any significant anemia or changes in blood counts then we may need to pursue an upper  endoscopy, again he has no upper tract symptoms and not sure if food ingestion is causing color change, as description is not typical for melena.  Pending his course if this persists we can consider occult stool testing for blood however in the setting of anticoagulation risk of false positive is higher and I did not do that in the setting of a DRE today. Hopefully these measures help with bowel symptoms and will await his course.  I told him I will let Dr. Carlean Purl know that I saw him and our plan. He agreed.   I spent 30 minutes of time, including in depth chart review, independent review of results as outlined above, face-to-face time with the patient, and documenting this encounter.  McCloud Cellar, MD [facility]

## 2020-04-28 ENCOUNTER — Other Ambulatory Visit: Payer: Self-pay | Admitting: *Deleted

## 2020-04-28 ENCOUNTER — Other Ambulatory Visit
Admission: RE | Admit: 2020-04-28 | Discharge: 2020-04-28 | Disposition: A | Discharge status: Home / Self Care | Payer: Medicare Other | Attending: Internal Medicine | Admitting: Internal Medicine

## 2020-04-28 DIAGNOSIS — I10 Essential (primary) hypertension: Secondary | ICD-10-CM

## 2020-04-28 DIAGNOSIS — I2510 Atherosclerotic heart disease of native coronary artery without angina pectoris: Secondary | ICD-10-CM | POA: Diagnosis not present

## 2020-04-28 DIAGNOSIS — Z79899 Other long term (current) drug therapy: Secondary | ICD-10-CM

## 2020-04-28 DIAGNOSIS — E785 Hyperlipidemia, unspecified: Secondary | ICD-10-CM | POA: Diagnosis not present

## 2020-04-28 LAB — COMPREHENSIVE METABOLIC PANEL
ALT: 19 U/L (ref 0–44)
AST: 22 U/L (ref 15–41)
Albumin: 3.8 g/dL (ref 3.5–5.0)
Alkaline Phosphatase: 52 U/L (ref 38–126)
Anion gap: 9 (ref 5–15)
BUN: 20 mg/dL (ref 8–23)
CO2: 25 mmol/L (ref 22–32)
Calcium: 9.1 mg/dL (ref 8.9–10.3)
Chloride: 106 mmol/L (ref 98–111)
Creatinine, Ser: 1.53 mg/dL — ABNORMAL HIGH (ref 0.61–1.24)
GFR calc Af Amer: 52 mL/min — ABNORMAL LOW (ref >60)
GFR calc non Af Amer: 45 mL/min — ABNORMAL LOW (ref >60)
Glucose, Bld: 94 mg/dL (ref 70–99)
Potassium: 4.3 mmol/L (ref 3.5–5.1)
Sodium: 140 mmol/L (ref 135–145)
Total Bilirubin: 1.4 mg/dL — ABNORMAL HIGH (ref 0.3–1.2)
Total Protein: 6.7 g/dL (ref 6.5–8.1)

## 2020-04-28 LAB — CBC
HCT: 43.5 % (ref 39.0–52.0)
Hemoglobin: 14.8 g/dL (ref 13.0–17.0)
MCH: 31.6 pg (ref 26.0–34.0)
MCHC: 34 g/dL (ref 30.0–36.0)
MCV: 92.8 fL (ref 80.0–100.0)
Platelets: 145 10*3/uL — ABNORMAL LOW (ref 150–400)
RBC: 4.69 MIL/uL (ref 4.22–5.81)
RDW: 13.2 % (ref 11.5–15.5)
WBC: 9.1 10*3/uL (ref 4.0–10.5)
nRBC: 0 % (ref 0.0–0.2)

## 2020-04-28 LAB — LIPID PANEL
Cholesterol: 125 mg/dL (ref 0–200)
HDL: 49 mg/dL (ref >40)
LDL Cholesterol: 64 mg/dL (ref 0–99)
Total CHOL/HDL Ratio: 2.6 RATIO
Triglycerides: 62 mg/dL (ref <150)
VLDL: 12 mg/dL (ref 0–40)

## 2020-04-28 LAB — TSH: TSH: 0.326 u[IU]/mL — ABNORMAL LOW (ref 0.350–4.500)

## 2020-04-28 NOTE — Addendum Note (Signed)
Addended by: Verlon Au on: 04/28/2020 08:45 AM   Modules accepted: Orders

## 2020-05-04 ENCOUNTER — Other Ambulatory Visit: Payer: Self-pay | Admitting: Family Medicine

## 2020-05-04 ENCOUNTER — Other Ambulatory Visit: Payer: Self-pay

## 2020-05-04 DIAGNOSIS — E039 Hypothyroidism, unspecified: Secondary | ICD-10-CM

## 2020-05-04 MED ORDER — FUROSEMIDE 20 MG PO TABS
ORAL_TABLET | ORAL | 0 refills | Status: DC
Start: 2020-05-04 — End: 2020-08-16

## 2020-05-04 MED ORDER — LEVOTHYROXINE SODIUM 150 MCG PO TABS
150.0000 ug | ORAL_TABLET | Freq: Every day | ORAL | Status: DC
Start: 2020-05-04 — End: 2020-07-14

## 2020-05-06 ENCOUNTER — Telehealth: Payer: Self-pay | Admitting: Family Medicine

## 2020-05-06 NOTE — Telephone Encounter (Signed)
Pt returned your call.  

## 2020-05-12 ENCOUNTER — Ambulatory Visit
Admission: RE | Admit: 2020-05-12 | Discharge: 2020-05-12 | Disposition: A | Discharge status: Home / Self Care | Payer: Medicare Other | Source: Ambulatory Visit | Attending: Internal Medicine | Admitting: Internal Medicine

## 2020-05-12 ENCOUNTER — Other Ambulatory Visit: Payer: Self-pay

## 2020-05-12 DIAGNOSIS — I700 Atherosclerosis of aorta: Secondary | ICD-10-CM | POA: Diagnosis not present

## 2020-05-12 DIAGNOSIS — I712 Thoracic aortic aneurysm, without rupture, unspecified: Secondary | ICD-10-CM

## 2020-05-12 DIAGNOSIS — K449 Diaphragmatic hernia without obstruction or gangrene: Secondary | ICD-10-CM | POA: Diagnosis not present

## 2020-05-12 DIAGNOSIS — I2510 Atherosclerotic heart disease of native coronary artery without angina pectoris: Secondary | ICD-10-CM | POA: Diagnosis not present

## 2020-05-12 IMAGING — CT CT ANGIO CHEST
3 of 6 series · 18 of 46 positions shown · IV contrast (APPLIED)
Comparison: [DATE]

CLINICAL DATA: Thoracic aortic prominence

EXAM:
CT ANGIOGRAPHY CHEST WITH CONTRAST
TECHNIQUE: Multidetector CT imaging of the chest was performed using the
standard protocol during bolus administration of intravenous
contrast. Multiplanar CT image reconstructions and MIPs were
obtained to evaluate the vascular anatomy.
CONTRAST:  60mL OMNIPAQUE IOHEXOL 350 MG/ML SOLN

[Series 4: axial arterial · axial · arterial · 0.90mm/px · z∈[-417,-99]mm · 12 of 126 slices shown]
[im 10/126  lung]
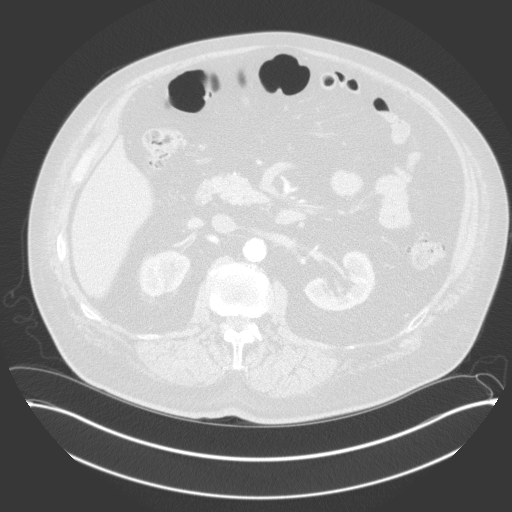
[im 20/126  soft-tissue]
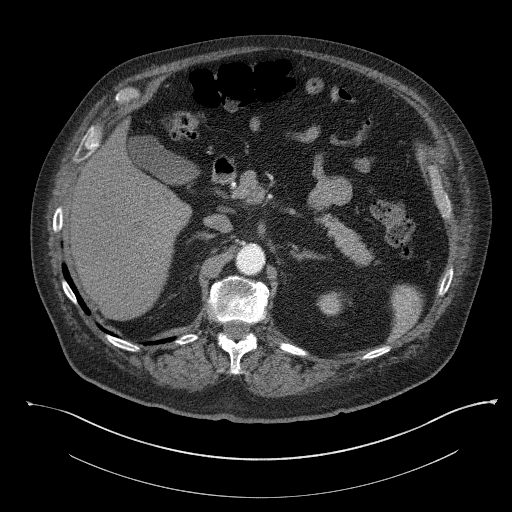
[im 29/126  lung]
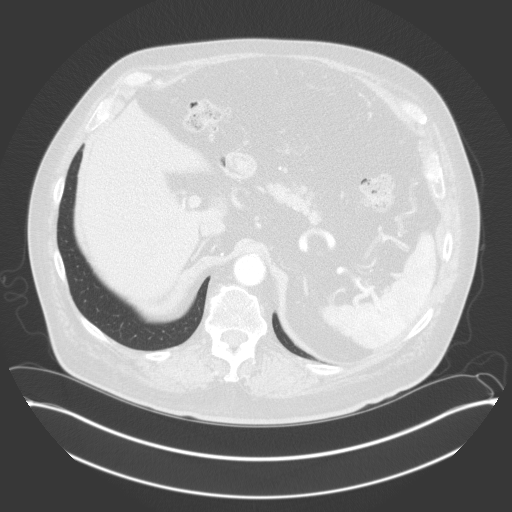
[im 39/126  soft-tissue]
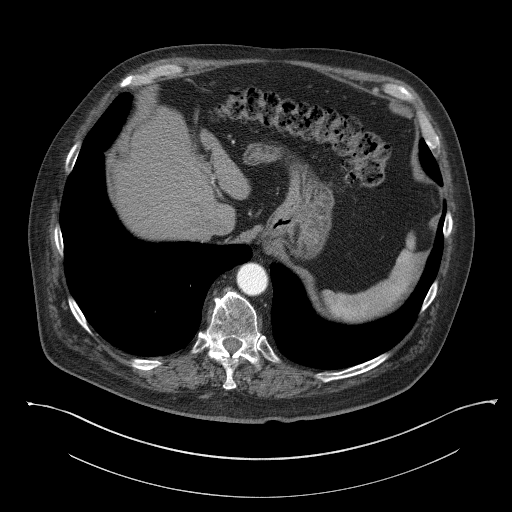
[im 49/126  lung]
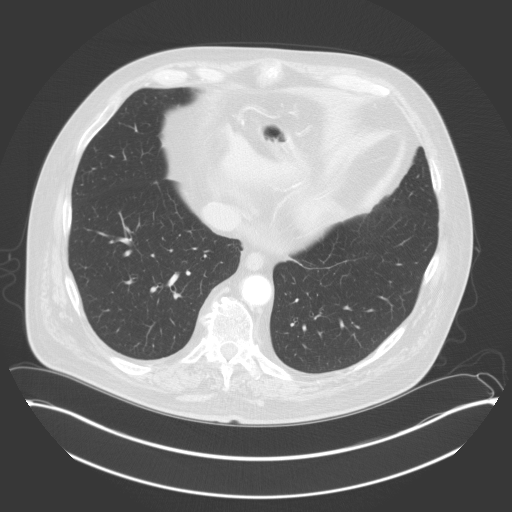
[im 58/126  soft-tissue]
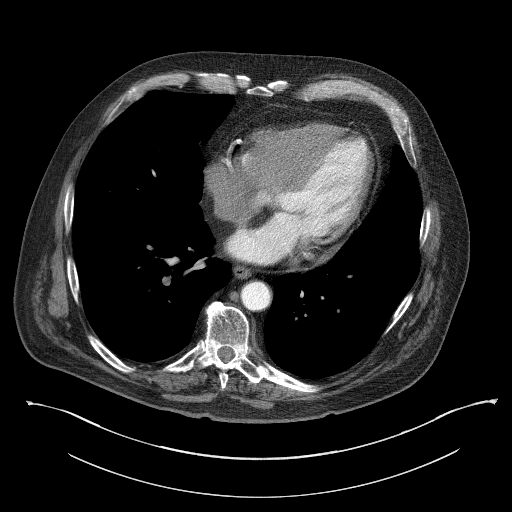
[im 68/126  lung]
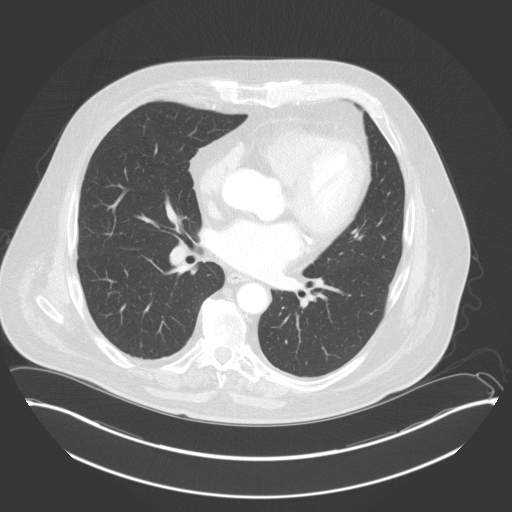
[im 77/126  soft-tissue]
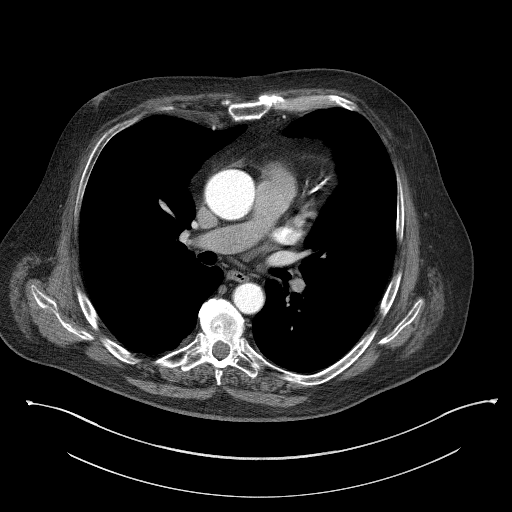
[im 87/126  lung]
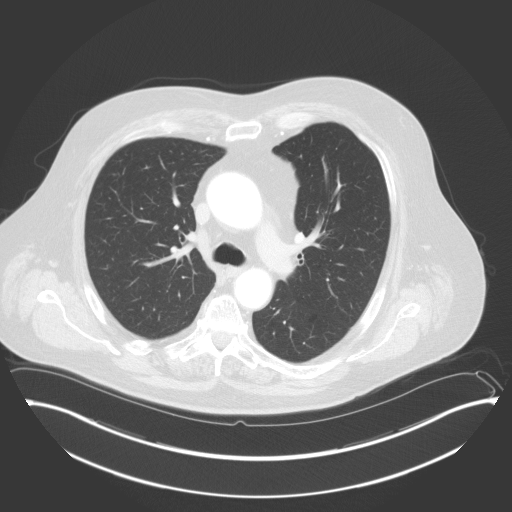
[im 97/126  soft-tissue]
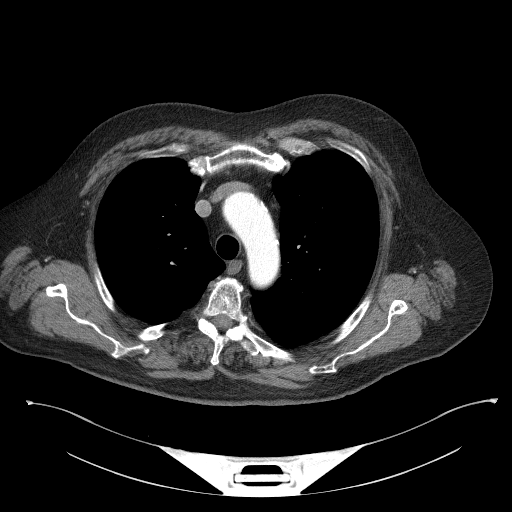
[im 106/126  lung]
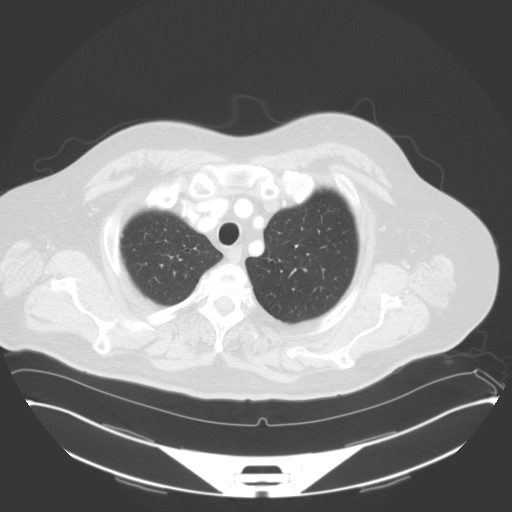
[im 116/126  soft-tissue]
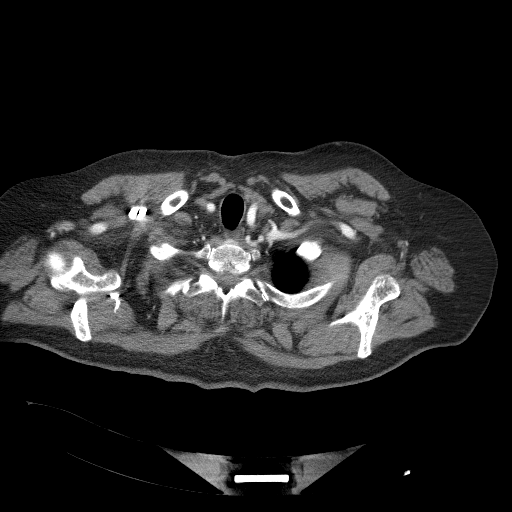

[Series 5: lung · axial · 0.84mm/px · z∈[-357,-281]mm · 3 of 164 slices shown]
[im 20/164  soft-tissue]
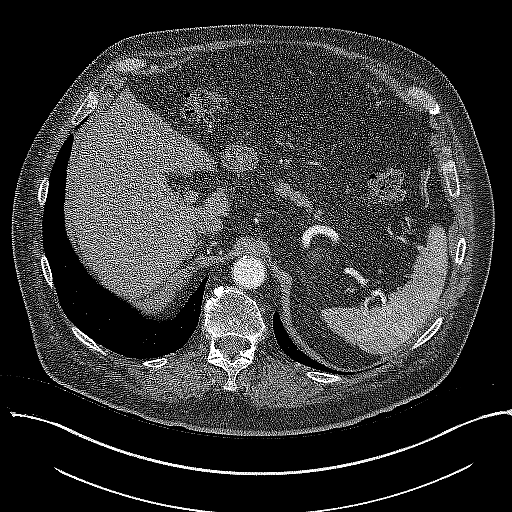
[im 39/164  soft-tissue]
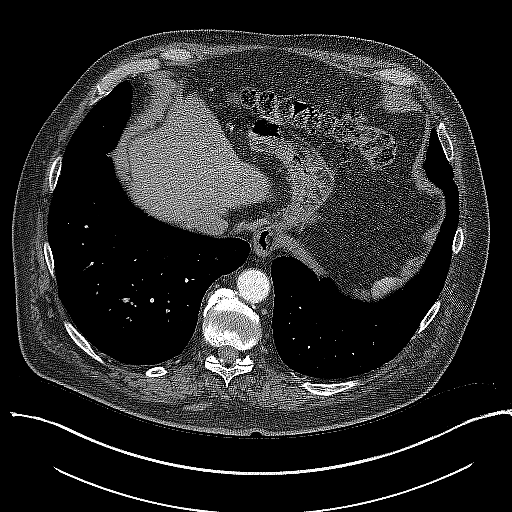
[im 58/164  soft-tissue]
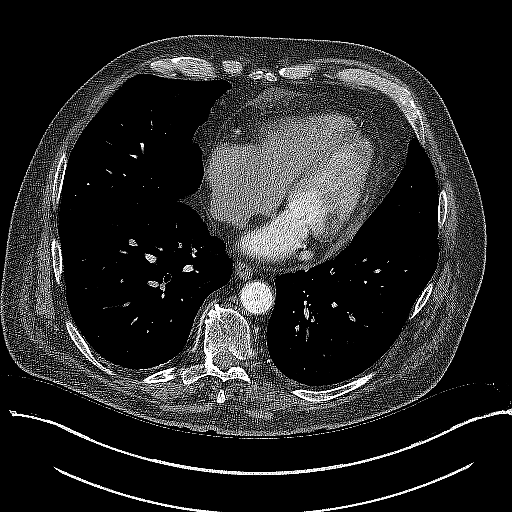

[Series 7: coronal · coronal · 0.78mm/px · 3 of 120 slices shown]
[im 30/120  soft-tissue]
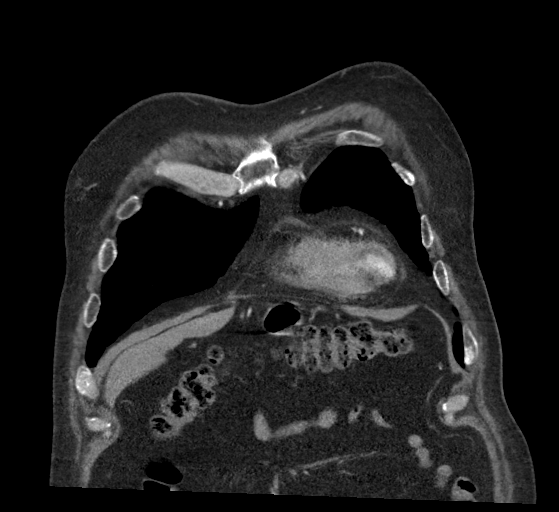
[im 60/120  soft-tissue]
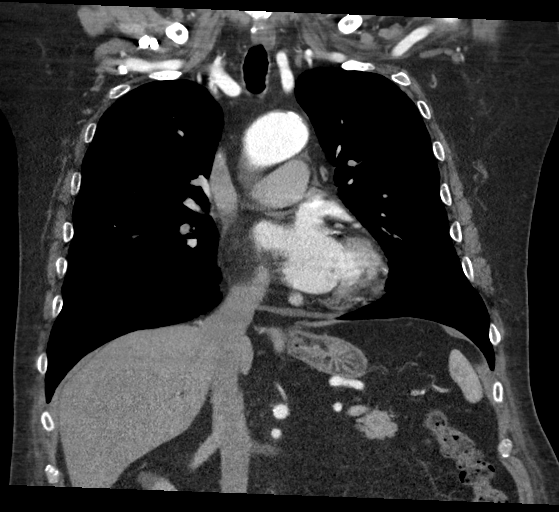
[im 90/120  soft-tissue]
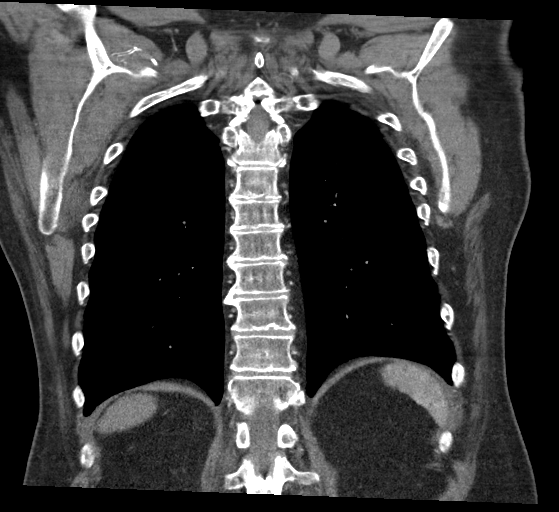

[18 of 46 positions shown; findings below may reference images not displayed]

FINDINGS: Cardiovascular: Ascending thoracic aortic diameter measures 4.6 x
4.6 cm, stable. Measured diameter at the sinuses of Valsalva
measures 4.0 cm. Measured diameter of the aorta at the arch level
measures 3.7 cm. The descending thoracic aortic diameter at the
level of the main pulmonary outflow tract measures 2.9 x 2.9 cm.
There is no thoracic aortic dissection. Visualized great vessels
appear unremarkable. There are foci of aortic atherosclerosis as
well as multiple foci of coronary artery calcification. No
pericardial thickening noted. Minimal pericardial fluid is within
physiologic range. No major vessel pulmonary emboli evident. Timing
bolus was targeted to optimize visualization of aorta as opposed to
pulmonary arteries.

Mediastinum/Nodes: Thyroid is diminutive without focal thyroid
lesion. There is no appreciable thoracic adenopathy. There is a
rather minimal hiatal hernia.

Lungs/Pleura: There is no edema or airspace opacity. No pleural
effusions are evident.

Upper Abdomen: There is upper abdominal aortic atherosclerosis.
There is a cyst arising from the anterior mid right kidney measuring
2.3 x 2.2 cm. Visualized upper abdominal structures otherwise appear
unremarkable.

Musculoskeletal: There are foci of degenerative change in the
thoracic spine. There are no blastic or lytic bone lesions. No chest
wall lesions are evident.

Review of the MIP images confirms the above findings.
IMPRESSION: 1. Stable ascending thoracic aortic diameter of 4.6 x 4.6 cm. Other
thoracic aortic diameter measurements as noted. No dissection.
Ascending thoracic aortic aneurysm. Recommend semi-annual imaging
followup by CTA or MRA and referral to cardiothoracic surgery if not
already obtained. This recommendation follows [1R]
ACCF/AHA/AATS/ACR/ASA/SCA/MY/MY/MY/MY Guidelines for the
Diagnosis and Management of Patients With Thoracic Aortic Disease.
Circulation. [1R]; 121: [id number]. Aortic aneurysm NOS
([1R]-[1R]). There are foci of aortic atherosclerosis as well as
foci of coronary artery calcification.

2.  Lungs clear.

3.  No evident adenopathy.

4.  Minimal hiatal hernia.

Aortic Atherosclerosis ([1R]-[1R]).

## 2020-05-12 MED ORDER — IOHEXOL 350 MG/ML SOLN
60.0000 mL | Freq: Once | INTRAVENOUS | Status: AC | PRN
  Administered 2020-05-12: 60 mL via INTRAVENOUS

## 2020-05-16 ENCOUNTER — Encounter: Payer: Self-pay | Admitting: Internal Medicine

## 2020-05-16 ENCOUNTER — Ambulatory Visit: Payer: Medicare Other | Admitting: Internal Medicine

## 2020-05-16 ENCOUNTER — Other Ambulatory Visit: Payer: Self-pay

## 2020-05-16 VITALS — BP 112/68 | HR 88 | Ht 71.25 in | Wt 268.0 lb

## 2020-05-16 DIAGNOSIS — I10 Essential (primary) hypertension: Secondary | ICD-10-CM | POA: Diagnosis not present

## 2020-05-16 DIAGNOSIS — I4819 Other persistent atrial fibrillation: Secondary | ICD-10-CM | POA: Diagnosis not present

## 2020-05-16 DIAGNOSIS — R0600 Dyspnea, unspecified: Secondary | ICD-10-CM

## 2020-05-16 DIAGNOSIS — I5032 Chronic diastolic (congestive) heart failure: Secondary | ICD-10-CM | POA: Diagnosis not present

## 2020-05-16 DIAGNOSIS — R0609 Other forms of dyspnea: Secondary | ICD-10-CM

## 2020-05-16 DIAGNOSIS — I2510 Atherosclerotic heart disease of native coronary artery without angina pectoris: Secondary | ICD-10-CM

## 2020-05-16 NOTE — Progress Notes (Signed)
Follow-up Outpatient Visit Date: 05/16/2020  Primary Care Provider: Tonia Ghent, MD [place] [address]  Chief Complaint: Shortness of breath  HPI:  Mr. Burgo is a 74 y.o. male with history of coronaryartery diseasestatus post PCI to the RCA (05/2018), HFpEF, persistent atrial fibrillation, hypertension, hyperlipidemia, hypothyroidism, and obesity, who presents for follow-up of coronary artery disease.  I last saw Mr. Kozar in April, at which time he was feeling relatively well with stable chronic exertional dyspnea.  Follow-up CT of the chest last week showed stable ascending aortic aneurysm measuring 4.6 cm in maximal diameter.  Today, Mr. Goodson reports that he has felt a little more out of breath when walking compared with last year.  Despite this, he is trying to stay active at home.  He denies chest pain, palpitations, lightheadedness, edema, orthopnea, and PND.  Prior to his PCI in 2019, Mr. Marsiglia felt like it was difficult for him to take a deep breath.  He has not felt anything like this recently.  --------------------------------------------------------------------------------------------------  Past Medical History:  Diagnosis Date  . (HFpEF) heart failure with preserved ejection fraction ([place])    a. 05/2018 Echo: EF 55-60%, gr2 DD.  Marland Kitchen Acute lower GI bleeding after colonoscopy and polypectomy, resolved 12/12/2015  . Ascending aortic aneurysm ([place])    a. 02/2018 Echo: mildly dil Ao root/asc ao/arch; b. 05/2018 CTA Chest: 4.7cm fusiform aneurysm of Asc Ao.  Marland Kitchen CAD (coronary artery disease)    a. 05/2018 Cath/PCI: LM nl,LAD 30p, 90/99d/apical, LCX 49m OM1/2/3 min irregs, RCA 85p (3.5x18 [place].  . Cardiac murmur    a. 02/2018 Echo: EF 60-65%, no rwma, mild AI, mildly dil Ao root/Asc Ao/Arch, Mild MR, mildly dil LA. Nl RV fxn.  . Cataract    bilateral surgery to remove  . CHF (congestive heart failure) [place]   . Colon polyp   . Constipation     . Essential hypertension   . Hemorrhoids   . Hepatitis    self resolved, likely food exposure, 1974.   .Marland KitchenHistory of cardiovascular stress test    a. 02/2018 Myoview: EF 55-65%, small/mild apical defect w/ nl wall motion ->attenuation artifact. No ischemia. Low risk study.  .Marland KitchenHx of adenomatous colonic polyps 12/05/2015  . Hyperlipidemia   . Hypothyroidism   . Mitral regurgitation    a. 110/2019 Echo: EF 55-60%. Gr2 DD. Triv AI. Ao root 397m Mild MR. Mod dil LA, mildly dil RA.  . Osteoarthritis   . Persistent atrial fibrillation ([place]   a. Dx 02/2018. CHA2DS2VASc = 2-->Eliquis; b. 03/2018 DCCV (200J); c. 03/2018 recurrent Afib-->flecainide started 04/2018;  d. 05/06/2018 s/p successful DCCV - 200J x 2-->on amio.  . Skin cancer    basal cell, R ear, MOHS   Past Surgical History:  Procedure Laterality Date  . BROW LIFT Bilateral 10/23/2016   Procedure: BLEPHAROPLASTY upper eyelid with excess skin;  Surgeon: AmKarle StarchMD;  [facility] Service: [facility];  Laterality: Bilateral;  MAC  . CARDIOVERSION N/A 03/18/2018   Procedure: CARDIOVERSION;  Surgeon: EnNelva BushMD;  [facility];  Service: Cardiovascular;  Laterality: N/A;  . CARDIOVERSION N/A 05/06/2018   Procedure: CARDIOVERSION;  Surgeon: EnNelva BushMD;  Location: [facility];  Service: Cardiovascular;  Laterality: N/A;  . CATARACT EXTRACTION  1986   OD  . CATARACT EXTRACTION Bilateral 1995   x 2 for right and left   . COLONOSCOPY    . CORONARY  STENT INTERVENTION N/A 05/13/2018   Procedure: CORONARY STENT INTERVENTION;  Surgeon: Nelva Bush, MD;  Location: [facility];  Service: Cardiovascular;  Laterality: N/A;  . ECTROPION REPAIR Bilateral 10/23/2016   Procedure: REPAIR OF ECTROPION sutures, extensive;  Surgeon: Karle Starch, MD;  Location: [facility];  Service: Ophthalmology;  Laterality: Bilateral;  . LIPOMA EXCISION  06/1997   left neck ([place])  . MOHS SURGERY Right  2013   behind right ear  . RIGHT/LEFT HEART CATH AND CORONARY ANGIOGRAPHY N/A 05/12/2018   Procedure: RIGHT/LEFT HEART CATH AND CORONARY ANGIOGRAPHY;  Surgeon: Wellington Hampshire, MD;  Location: [facility];  Service: Cardiovascular;  Laterality: N/A;  . TONSILLECTOMY    . VASECTOMY  1982    Current Meds  Medication Sig  . Acetaminophen (TYLENOL 8 HOUR PO) Take 200 mg by mouth. Taking PRN  . amiodarone (PACERONE) 100 MG tablet TAKE 1 TABLET BY MOUTH EVERY DAY  . apixaban (ELIQUIS) 5 MG TABS tablet TAKE 1 TABLET(5 MG) BY MOUTH TWICE DAILY  . atorvastatin (LIPITOR) 80 MG tablet Take 1 tablet (80 mg total) by mouth daily at 6 PM.  . DHA-EPA-Coenzyme Q10-Vitamin E (CO Q-10 VITAMIN E FISH OIL PO) Take 1 capsule by mouth daily.  Marland Kitchen doxazosin (CARDURA) 4 MG tablet Take 1 tablet (4 mg total) by mouth daily after breakfast.  . ezetimibe (ZETIA) 10 MG tablet TAKE 1 TABLET BY MOUTH DAILY. GENERIC EQUIVALENT FOR ZETIA.  . finasteride (PROSCAR) 5 MG tablet Take 1 tablet (5 mg total) by mouth daily.  . furosemide (LASIX) 20 MG tablet Take 1 tablet (20 mg total) by mouth once a day.  . levothyroxine (SYNTHROID) 150 MCG tablet Take 1 tablet (150 mcg total) by mouth daily before breakfast. Except take 1/2 tablet on Sundays.  Total of 6.5 tablets in 1 week.  . Multiple Vitamin (MULTIVITAMIN WITH MINERALS) TABS tablet Take 1 tablet by mouth daily. Senior Multivitamin  . polyethylene glycol (MIRALAX) 17 g packet Take 17 g by mouth 2 (two) times daily.  . ramipril (ALTACE) 10 MG capsule Take 1 capsule (10 mg total) by mouth daily.  . Vitamins/Minerals TABS Take by mouth.    Allergies: Sulfonamide derivatives  Social History   Tobacco Use  . Smoking status: Former Smoker    Packs/day: 2.00    Years: 20.00    Pack years: 40.00    Types: Cigarettes    Quit date: 08/07/1983    Years since quitting: 36.8  . Smokeless tobacco: Never Used  . Tobacco comment: quit over 30 years ago  Vaping Use  .  Vaping Use: Never used  Substance Use Topics  . Alcohol use: Yes    Alcohol/week: 1.0 standard drink    Types: 1 Glasses of wine per week    Comment: per day  . Drug use: No    Family History  Problem Relation Age of Onset  . Heart disease Paternal Grandfather        MI, old age  . Stroke Mother   . Lung cancer Maternal Grandfather        smoker  . Stroke Maternal Grandfather   . Colon cancer Neg Hx   . Colon polyps Neg Hx   . Esophageal cancer Neg Hx   . Rectal cancer Neg Hx   . Stomach cancer Neg Hx   . Bladder Cancer Neg Hx   . Prostate cancer Neg Hx     Review of Systems: A 12-system review  of systems was performed and was negative except as noted in the HPI.  --------------------------------------------------------------------------------------------------  Physical Exam: BP 112/68   Pulse 88   Ht 5' 11.25" (1.81 m)   Wt 268 lb (121.6 kg)   BMI 37.12 kg/m   General: NAD. Neck: No JVD or HJR. Lungs: Mildly diminished breath sounds throughout without wheezes or crackles. Heart: Regular rate and rhythm without murmurs, rubs, or gallops. Abdomen: Soft, nontender, nondistended. Extremities: No lower extremity edema.  EKG: Normal sinus rhythm with left anterior fascicular block.  Compared with prior tracing from 11/25/2019, LAFB is now present.  Lab Results  Component Value Date   WBC 9.1 04/28/2020   HGB 14.8 04/28/2020   HCT 43.5 04/28/2020   MCV 92.8 04/28/2020   PLT 145 (L) 04/28/2020    Lab Results  Component Value Date   NA 140 04/28/2020   K 4.3 04/28/2020   CL 106 04/28/2020   CO2 25 04/28/2020   BUN 20 04/28/2020   CREATININE 1.53 (H) 04/28/2020   GLUCOSE 94 04/28/2020   ALT 19 04/28/2020    Lab Results  Component Value Date   CHOL 125 04/28/2020   HDL 49 04/28/2020   LDLCALC 64 04/28/2020   TRIG 62 04/28/2020   CHOLHDL 2.6 04/28/2020     --------------------------------------------------------------------------------------------------  ASSESSMENT AND PLAN: Coronary artery disease and dyspnea on exertion: Overall, Mr. Musto reports slight worsening of exertional dyspnea over the last year.  Breathing issues were his anginal equivalent leading up to his PCI in 2019, though he has not felt anything reminiscent of what he experienced at that time.  He appears euvolemic on examination today with relatively stable weight since 09/2019.  We discussed myocardial perfusion stress testing to evaluate for progression of his ischemic heart disease but have agreed to defer as Mr. Nordling feels like his symptom progression has been fairly mild.  We will continue his current medications for secondary prevention including atorvastatin and ezetimibe.  He is on apixaban 5 mg twice daily, given history of atrial fibrillation, in lieu of aspirin.  Persistent atrial fibrillation: Mr. Hoose is maintaining sinus rhythm.  We will plan to continue amiodarone 100 mg daily as well as apixaban 5 mg twice daily.  Of note, PFTs in May were normal.  Chronic HFpEF: Mr. Kehoe reports some gradual progression of his chronic DOE consistent with NYHA class II-3 heart failure.  He appears euvolemic on exam nation today.  We will continue with furosemide 20 mg daily as well as ramipril 10 mg daily for blood pressure control.  Hypertension: Blood pressure is well controlled today.  No medication changes.  Follow-up: Return to clinic in 3 months.  Nelva Bush, MD 05/16/2020 10:34 AM

## 2020-05-16 NOTE — Patient Instructions (Signed)
Medication Instructions:  Your physician recommends that you continue on your current medications as directed. Please refer to the Current Medication list given to you today.  *If you need a refill on your cardiac medications before your next appointment, please call your pharmacy*  Follow-Up: At [facility], you and your health needs are our priority.  As part of our continuing mission to provide you with exceptional heart care, we have created designated Provider Care Teams.  These Care Teams include your primary Cardiologist (physician) and Advanced Practice Providers (APPs -  Physician Assistants and Nurse Practitioners) who all work together to provide you with the care you need, when you need it.  We recommend signing up for the patient portal called "MyChart".  Sign up information is provided on this After Visit Summary.  MyChart is used to connect with patients for Virtual Visits (Telemedicine).  Patients are able to view lab/test results, encounter notes, upcoming appointments, etc.  Non-urgent messages can be sent to your provider as well.   To learn more about what you can do with MyChart, go to [email or web address].    Your next appointment:   3 month(s)  The format for your next appointment:   In Person  Provider:   You may see Nelva Bush, MD or one of the following Advanced Practice Providers on your designated Care Team:    Murray Hodgkins, NP  Christell Faith, PA-C  Marrianne Mood, PA-C  Cadence Kathlen Mody, [place]     How to Increase Your Level of Physical Activity Getting regular physical activity is important for your overall health and well-being. Most people do not get enough exercise. There are easy ways to increase your level of physical activity, even if you have not been very active in the past or if you are just starting out. How can increasing my physical activity affect me? Physical activity has many short-term and long-term benefits. Being active on a  regular basis can improve your physical and mental health as well as provide other benefits. Physical health benefits  Helping you lose weight or maintain a healthy weight.  Strengthening your muscles and bones.  Reducing your risk of certain long-term (chronic) diseases, including heart disease, cancer, and diabetes.  Being able to move around more easily and for longer periods of time without getting tired (increased stamina).  Improving your ability to fight off illness (enhanced immunity).  Being able to sleep better.  Helping you stay healthy as you get older, including: ? Helping you stay mobile, or capable of walking and moving around. ? Preventing accidents, such as falls. ? Increasing life expectancy. Mental health benefits  Boosting your mood and improving your self-esteem.  Lowering your chance of having mental health problems, such as depression or anxiety.  Helping you feel good about your body. Other benefits  Finding new sources of fun and enjoyment.  Meeting new people who share a common interest. What steps can I take to be more physically active? Getting started  If you have a chronic illness or have not been active for a while, check with your health care provider about how to get started. Ask your health care provider what activities are safe for you.  Start out slowly. Walking or doing some simple chair exercises is a good place to start, especially if you have not been active before or for a long time.  Set goals that you can work toward. Ask your health care provider how much exercise is best for you. In general,  most adults should: ? Do moderate-intensity exercise for at least 150 minutes each week (30 minutes on most days of the week) or vigorous exercise for at least 75 minutes each week, or a combination of these.  Moderate-intensity exercise can include walking at a quick pace, biking, yoga, water aerobics, or gardening.  Vigorous exercise  involves activities that take more effort, such as jogging or running, playing sports, swimming laps, or jumping rope. ? Do strength exercises on at least 2 days each week. This can include weight lifting, body weight exercises, and resistance-band exercises.  Consider using a fitness tracker, such as a mobile phone app or a device worn like a watch, that will count the number of steps you take each day. Many people strive to reach 10,000 steps a day. Choosing activities  Try to find activities that you enjoy. You are more likely to commit to an exercise routine if it does not feel like a chore.  If you have bone or joint problems, choose low-impact exercises, like walking or swimming.  Use these tips for being successful with an exercise plan: ? Find a workout partner for accountability. ? Join a group or class, such as an aerobics class, cycling class, or sports team. ? Make family time active. Go for a walk, bike, or swim. ? Include a variety of exercises each week. Being active in your daily routines Besides your formal exercise plans, you can find ways to do physical activity during your daily routines, such as:  Walking or biking to work or to the store.  Taking the stairs instead of the elevator.  Parking farther away from the door at work or at the store.  Planning walking meetings.  Walking around while you are on the phone.  Where to find more information  Centers for Disease Control and Prevention: WorkDashboard.es  President's Council on Fitness, Sports & Nutrition: www.fitness.gov  ChooseMyPlate: FormerBoss.no Contact a health care provider if:  You have headaches, muscle aches, or joint pain.  You feel dizzy or light-headed while exercising.  You faint.  You have chest pain while exercising. Summary  Exercise benefits your mind and body at any age, even if you are just starting out.  If you have a chronic illness or have not been  active for a while, check with your health care provider before increasing your physical activity.  Choose activities that are safe and enjoyable for you. Ask your health care provider what activities are safe for you.  Start slowly. Tell your health care provider if you have problems as you start to increase your activity level. This information is not intended to replace advice given to you by your health care provider. Make sure you discuss any questions you have with your health care provider. Document Revised: 04/27/2019 Document Reviewed: 02/16/2019 Elsevier Patient Education  [place].

## 2020-05-17 ENCOUNTER — Encounter: Payer: Self-pay | Admitting: Internal Medicine

## 2020-05-20 ENCOUNTER — Other Ambulatory Visit: Payer: Self-pay | Admitting: *Deleted

## 2020-05-20 MED ORDER — DOXAZOSIN MESYLATE 4 MG PO TABS
4.0000 mg | ORAL_TABLET | Freq: Every day | ORAL | 0 refills | Status: DC
Start: 2020-05-20 — End: 2020-09-05

## 2020-06-02 ENCOUNTER — Other Ambulatory Visit: Payer: Self-pay | Admitting: Internal Medicine

## 2020-06-02 NOTE — Telephone Encounter (Signed)
Rx request sent to pharmacy.  

## 2020-06-10 ENCOUNTER — Other Ambulatory Visit: Payer: Self-pay | Admitting: Internal Medicine

## 2020-06-10 MED ORDER — EZETIMIBE 10 MG PO TABS: ORAL_TABLET | ORAL | 0 refills | Status: DC

## 2020-06-10 NOTE — Telephone Encounter (Signed)
Requested Prescriptions   Signed Prescriptions Disp Refills   ezetimibe (ZETIA) 10 MG tablet 90 tablet 0    Sig: TAKE 1 TABLET BY MOUTH DAILY GENERIC EQUIVALENT FOR ZETIA    Authorizing Provider: END, CHRISTOPHER    Ordering User: Britt Bottom

## 2020-06-10 NOTE — Telephone Encounter (Signed)
*  STAT* If patient is at the pharmacy, call can be transferred to refill team.   1. Which medications need to be refilled? (please list name of each medication and dose if known) zetia 10 mg (generic)  2. Which pharmacy/location (including street and city if local pharmacy) is medication to be sent to? [facility]  3. Do they need a 30 day or 90 day supply? [place]

## 2020-06-23 ENCOUNTER — Telehealth: Payer: Self-pay

## 2020-06-23 NOTE — Telephone Encounter (Signed)
Called patient per Dr. Damita Dunnings to see if patient had enough supply of the Levothyroxine 0.150 mg to last until his next lab appointment on 07/11/2020. Patient stated that he does have enough to last until then. Instructed patient to call office back if he needs anything. Patient verbalized understanding.

## 2020-06-24 NOTE — Telephone Encounter (Signed)
Noted. Thanks.

## 2020-07-11 ENCOUNTER — Other Ambulatory Visit (INDEPENDENT_AMBULATORY_CARE_PROVIDER_SITE_OTHER): Payer: Medicare Other

## 2020-07-11 ENCOUNTER — Other Ambulatory Visit: Payer: Self-pay

## 2020-07-11 DIAGNOSIS — E039 Hypothyroidism, unspecified: Secondary | ICD-10-CM | POA: Diagnosis not present

## 2020-07-11 LAB — TSH: TSH: 0.97 u[IU]/mL (ref 0.35–4.50)

## 2020-07-14 ENCOUNTER — Other Ambulatory Visit: Payer: Self-pay | Admitting: Family Medicine

## 2020-07-14 MED ORDER — LEVOTHYROXINE SODIUM 150 MCG PO TABS
150.0000 ug | ORAL_TABLET | Freq: Every day | ORAL | 3 refills | Status: DC
Start: 2020-07-14 — End: 2021-05-24

## 2020-08-02 ENCOUNTER — Other Ambulatory Visit: Payer: Self-pay

## 2020-08-02 DIAGNOSIS — I4891 Unspecified atrial fibrillation: Secondary | ICD-10-CM

## 2020-08-02 MED ORDER — APIXABAN 5 MG PO TABS: ORAL_TABLET | ORAL | 0 refills | Status: DC

## 2020-08-16 ENCOUNTER — Other Ambulatory Visit: Payer: Self-pay

## 2020-08-16 ENCOUNTER — Ambulatory Visit (INDEPENDENT_AMBULATORY_CARE_PROVIDER_SITE_OTHER): Payer: Medicare Other

## 2020-08-16 ENCOUNTER — Other Ambulatory Visit: Payer: Self-pay | Admitting: Family Medicine

## 2020-08-16 DIAGNOSIS — Z0000 Encounter for general adult medical examination without abnormal findings: Secondary | ICD-10-CM

## 2020-08-16 MED ORDER — FUROSEMIDE 20 MG PO TABS
ORAL_TABLET | ORAL | 1 refills | Status: DC
Start: 2020-08-16 — End: 2020-09-05

## 2020-08-16 NOTE — Progress Notes (Signed)
PCP notes:  Health Maintenance: No gaps noted   Abnormal Screenings: none   Patient concerns: Needs refill on his Lasix   Nurse concerns: none   Next PCP appt.: 08/23/2019 @ 8:30 am

## 2020-08-16 NOTE — Patient Instructions (Signed)
James [name] , Thank you for taking time to come for your Medicare Wellness Visit. I appreciate your ongoing commitment to your health goals. Please review the following plan we discussed and let me know if I can assist you in the future.   Screening recommendations/referrals: Colonoscopy: Up to date, completed 10/19/2019, due 10/2022 Recommended yearly ophthalmology/optometry visit for glaucoma screening and checkup Recommended yearly dental visit for hygiene and checkup  Vaccinations: Influenza vaccine: Up to date, completed 05/06/2020, due 03/2021 Pneumococcal vaccine: Completed series Tdap vaccine: decline-insurance Shingles vaccine: Completed series   Covid-19: Completed series  Advanced directives: copy in chart  Conditions/risks identified: hypertension, hyperlipidemia  Next appointment: Follow up in one year for your annual wellness visit.   Preventive Care 10 Years and Older, Male Preventive care refers to lifestyle choices and visits with your health care provider that can promote health and wellness. What does preventive care include?  A yearly physical exam. This is also called an annual well check.  Dental exams once or twice a year.  Routine eye exams. Ask your health care provider how often you should have your eyes checked.  Personal lifestyle choices, including:  Daily care of your teeth and gums.  Regular physical activity.  Eating a healthy diet.  Avoiding tobacco and drug use.  Limiting alcohol use.  Practicing safe sex.  Taking low doses of aspirin every day.  Taking vitamin and mineral supplements as recommended by your health care provider. What happens during an annual well check? The services and screenings done by your health care provider during your annual well check will depend on your age, overall health, lifestyle risk factors, and family history of disease. Counseling  Your health care provider may ask you questions about your:  Alcohol  use.  Tobacco use.  Drug use.  Emotional well-being.  Home and relationship well-being.  Sexual activity.  Eating habits.  History of falls.  Memory and ability to understand (cognition).  Work and work Statistician. Screening  You may have the following tests or measurements:  Height, weight, and BMI.  Blood pressure.  Lipid and cholesterol levels. These may be checked every 5 years, or more frequently if you are over 22 years old.  Skin check.  Lung cancer screening. You may have this screening every year starting at age 37 if you have a 30-pack-year history of smoking and currently smoke or have quit within the past 15 years.  Fecal occult blood test (FOBT) of the stool. You may have this test every year starting at age 11.  Flexible sigmoidoscopy or colonoscopy. You may have a sigmoidoscopy every 5 years or a colonoscopy every 10 years starting at age 57.  Prostate cancer screening. Recommendations will vary depending on your family history and other risks.  Hepatitis C blood test.  Hepatitis B blood test.  Sexually transmitted disease (STD) testing.  Diabetes screening. This is done by checking your blood sugar (glucose) after you have not eaten for a while (fasting). You may have this done every 1-3 years.  Abdominal aortic aneurysm (AAA) screening. You may need this if you are a current or former smoker.  Osteoporosis. You may be screened starting at age 50 if you are at high risk. Talk with your health care provider about your test results, treatment options, and if necessary, the need for more tests. Vaccines  Your health care provider may recommend certain vaccines, such as:  Influenza vaccine. This is recommended every year.  Tetanus, diphtheria, and acellular pertussis (  Tdap, Td) vaccine. You may need a Td booster every 10 years.  Zoster vaccine. You may need this after age 33.  Pneumococcal 13-valent conjugate (PCV13) vaccine. One dose is  recommended after age 31.  Pneumococcal polysaccharide (PPSV23) vaccine. One dose is recommended after age 63. Talk to your health care provider about which screenings and vaccines you need and how often you need them. This information is not intended to replace advice given to you by your health care provider. Make sure you discuss any questions you have with your health care provider. Document Released: 08/19/2015 Document Revised: 04/11/2016 Document Reviewed: 05/24/2015 Elsevier Interactive Patient Education  2017 [place] Prevention in the Home Falls can cause injuries. They can happen to people of all ages. There are many things you can do to make your home safe and to help prevent falls. What can I do on the outside of my home?  Regularly fix the edges of walkways and driveways and fix any cracks.  Remove anything that might make you trip as you walk through a door, such as a raised step or threshold.  Trim any bushes or trees on the path to your home.  Use bright outdoor lighting.  Clear any walking paths of anything that might make someone trip, such as rocks or tools.  Regularly check to see if handrails are loose or broken. Make sure that both sides of any steps have handrails.  Any raised decks and porches should have guardrails on the edges.  Have any leaves, snow, or ice cleared regularly.  Use sand or salt on walking paths during winter.  Clean up any spills in your garage right away. This includes oil or grease spills. What can I do in the bathroom?  Use night lights.  Install grab bars by the toilet and in the tub and shower. Do not use towel bars as grab bars.  Use non-skid mats or decals in the tub or shower.  If you need to sit down in the shower, use a plastic, non-slip stool.  Keep the floor dry. Clean up any water that spills on the floor as soon as it happens.  Remove soap buildup in the tub or shower regularly.  Attach bath mats  securely with double-sided non-slip rug tape.  Do not have throw rugs and other things on the floor that can make you trip. What can I do in the bedroom?  Use night lights.  Make sure that you have a light by your bed that is easy to reach.  Do not use any sheets or blankets that are too big for your bed. They should not hang down onto the floor.  Have a firm chair that has side arms. You can use this for support while you get dressed.  Do not have throw rugs and other things on the floor that can make you trip. What can I do in the kitchen?  Clean up any spills right away.  Avoid walking on wet floors.  Keep items that you use a lot in easy-to-reach places.  If you need to reach something above you, use a strong step stool that has a grab bar.  Keep electrical cords out of the way.  Do not use floor polish or wax that makes floors slippery. If you must use wax, use non-skid floor wax.  Do not have throw rugs and other things on the floor that can make you trip. What can I do with my stairs?  Do  not leave any items on the stairs.  Make sure that there are handrails on both sides of the stairs and use them. Fix handrails that are broken or loose. Make sure that handrails are as long as the stairways.  Check any carpeting to make sure that it is firmly attached to the stairs. Fix any carpet that is loose or worn.  Avoid having throw rugs at the top or bottom of the stairs. If you do have throw rugs, attach them to the floor with carpet tape.  Make sure that you have a light switch at the top of the stairs and the bottom of the stairs. If you do not have them, ask someone to add them for you. What else can I do to help prevent falls?  Wear shoes that:  Do not have high heels.  Have rubber bottoms.  Are comfortable and fit you well.  Are closed at the toe. Do not wear sandals.  If you use a stepladder:  Make sure that it is fully opened. Do not climb a closed  stepladder.  Make sure that both sides of the stepladder are locked into place.  Ask someone to hold it for you, if possible.  Clearly mark and make sure that you can see:  Any grab bars or handrails.  First and last steps.  Where the edge of each step is.  Use tools that help you move around (mobility aids) if they are needed. These include:  Canes.  Walkers.  Scooters.  Crutches.  Turn on the lights when you go into a dark area. Replace any light bulbs as soon as they burn out.  Set up your furniture so you have a clear path. Avoid moving your furniture around.  If any of your floors are uneven, fix them.  If there are any pets around you, be aware of where they are.  Review your medicines with your doctor. Some medicines can make you feel dizzy. This can increase your chance of falling. Ask your doctor what other things that you can do to help prevent falls. This information is not intended to replace advice given to you by your health care provider. Make sure you discuss any questions you have with your health care provider. Document Released: 05/19/2009 Document Revised: 12/29/2015 Document Reviewed: 08/27/2014 Elsevier Interactive Patient Education  2017 [employer].

## 2020-08-16 NOTE — Progress Notes (Signed)
Subjective:   James [name] is a 75 y.o. male who presents for Medicare Annual/Subsequent preventive examination.  Review of Systems: N/A      I connected with the patient today by telephone and verified that I am speaking with the correct person using two identifiers. Location patient: home Location nurse: work Persons participating in the telephone visit: patient, nurse.   I discussed the limitations, risks, security and privacy concerns of performing an evaluation and management service by telephone and the availability of in person appointments. I also discussed with the patient that there may be a patient responsible charge related to this service. The patient expressed understanding and verbally consented to this telephonic visit.        Cardiac Risk Factors include: advanced age (>18mn, >>40women);male gender;hypertension;Other (see comment), Risk factor comments: hyperlipidemia     Objective:    Today's Vitals   There is no height or weight on file to calculate BMI.  Advanced Directives 08/16/2020 08/11/2019 08/08/2018 05/13/2018 05/12/2018 05/09/2018 05/08/2018  Does Patient Have a Medical Advance Directive? _0  Yes Yes  Type of AParamedicof AChain-O-LakesLiving will HLinton HallLiving will HFajardoLiving will - Living will;Healthcare Power of AAlmaLiving will HCoto NorteLiving will  Does patient want to make changes to medical advance directive? - - - - No - Patient declined No - Patient declined No - Patient declined  Copy of HUplandin Chart? Yes - validated most recent copy scanned in chart (See row information) Yes - validated most recent copy scanned in chart (See row information) No - copy requested - No - copy requested No - copy requested No - copy requested    Current Medications (verified) Outpatient Encounter Medications as  of 08/16/2020  Medication Sig   Acetaminophen (TYLENOL 8 HOUR PO) Take 200 mg by mouth. Taking PRN   amiodarone (PACERONE) 100 MG tablet TAKE 1 TABLET BY MOUTH EVERY DAY   apixaban (ELIQUIS) 5 MG TABS tablet TAKE 1 TABLET(5 MG) BY MOUTH TWICE DAILY   atorvastatin (LIPITOR) 80 MG tablet Take 1 tablet (80 mg total) by mouth daily at 6 PM.   DHA-EPA-Coenzyme Q10-Vitamin E (CO Q-10 VITAMIN E FISH OIL PO) Take 1 capsule by mouth daily.   doxazosin (CARDURA) 4 MG tablet Take 1 tablet (4 mg total) by mouth daily after breakfast.   ezetimibe (ZETIA) 10 MG tablet TAKE 1 TABLET BY MOUTH DAILY GENERIC EQUIVALENT FOR ZETIA   finasteride (PROSCAR) 5 MG tablet Take 1 tablet (5 mg total) by mouth daily.   furosemide (LASIX) 20 MG tablet Take 1 tablet (20 mg total) by mouth once a day. (Patient taking differently: Take 1 tablet (20 mg total) by mouth every other day.)   levothyroxine (SYNTHROID) 150 MCG tablet Take 1 tablet (150 mcg total) by mouth daily before breakfast. Except take 1/2 tablet on Sundays.  Total of 6.5 tablets in 1 week.   Multiple Vitamin (MULTIVITAMIN WITH MINERALS) TABS tablet Take 1 tablet by mouth daily. Senior Multivitamin   polyethylene glycol (MIRALAX) 17 g packet Take 17 g by mouth 2 (two) times daily.   ramipril (ALTACE) 10 MG capsule Take 1 capsule (10 mg total) by mouth daily.   Vitamins/Minerals TABS Take by mouth.   No facility-administered encounter medications on file as of 08/16/2020.    Allergies (verified) Sulfonamide derivatives   History: Past Medical History:  Diagnosis Date   (  HFpEF) heart failure with preserved ejection fraction ([place])    a. 05/2018 Echo: EF 55-60%, gr2 DD.   Acute lower GI bleeding after colonoscopy and polypectomy, resolved 12/12/2015   Ascending aortic aneurysm ([place])    a. 02/2018 Echo: mildly dil Ao root/asc ao/arch; b. 05/2018 CTA Chest: 4.7cm fusiform aneurysm of Asc Ao.   CAD (coronary artery disease)    a. 05/2018  Cath/PCI: LM nl,LAD 30p, 90/99d/apical, LCX 70m OM1/2/3 min irregs, RCA 85p (3.5x18 [place].   Cardiac murmur    a. 02/2018 Echo: EF 60-65%, no rwma, mild AI, mildly dil Ao root/Asc Ao/Arch, Mild MR, mildly dil LA. Nl RV fxn.   Cataract    bilateral surgery to remove   CHF (congestive heart failure) (HCC)    Colon polyp    Constipation    Essential hypertension    Hemorrhoids    Hepatitis    self resolved, likely food exposure, 1974.    History of cardiovascular stress test    a. 02/2018 Myoview: EF 55-65%, small/mild apical defect w/ nl wall motion ->attenuation artifact. No ischemia. Low risk study.   Hx of adenomatous colonic polyps 12/05/2015   Hyperlipidemia    Hypothyroidism    Mitral regurgitation    a. 110/2019 Echo: EF 55-60%. Gr2 DD. Triv AI. Ao root 361m Mild MR. Mod dil LA, mildly dil RA.   Osteoarthritis    Persistent atrial fibrillation ([place]   a. Dx 02/2018. CHA2DS2VASc = 2-->Eliquis; b. 03/2018 DCCV (200J); c. 03/2018 recurrent Afib-->flecainide started 04/2018;  d. 05/06/2018 s/p successful DCCV - 200J x 2-->on amio.   Skin cancer    basal cell, R ear, MOHS   Past Surgical History:  Procedure Laterality Date   BROW LIFT Bilateral 10/23/2016   Procedure: BLEPHAROPLASTY upper eyelid with excess skin;  Surgeon: AmKarle StarchMD;  [place] [facility] Service: Ophthal[place];  Laterality: Bilateral;  MAC   CARDIOVERSION N/A 03/18/2018   Procedure: CARDIOVERSION;  Surgeon: EnNelva BushMD;  [place];  Service: Cardiovascular;  Laterality: N/A;   CARDIOVERSION N/A 05/06/2018   Procedure: CARDIOVERSION;  Surgeon: EnNelva BushMD;  [facility];  Service: Cardiovascular;  Laterality: N/A;   CATARACT EXTRACTION  1986   OD   CATARACT EXTRACTION Bilateral 1995   x 2 for right and left    COLONOSCOPY     CORONARY STENT INTERVENTION N/A 05/13/2018   Procedure: CORONARY STENT INTERVENTION;  Surgeon: EnNelva BushMD;   [facility];  Service: Cardiovascular;  Laterality: N/A;   ECTROPION REPAIR Bilateral 10/23/2016   Procedure: REPAIR OF ECTROPION sutures, extensive;  Surgeon: AmKarle StarchMD;  [place] [facility] Service: Ophthalmology;  Laterality: Bilateral;   LIPOMA EXCISION  06/1997   left neck ([place])   MOHS SURGERY Right 2013   behind right ear   RIGHT/LEFT HEART CATH AND CORONARY ANGIOGRAPHY N/A 05/12/2018   Procedure: RIGHT/LEFT HEART CATH AND CORONARY ANGIOGRAPHY;  Surgeon: ArWellington HampshireMD;  [facility];  Service: Cardiovascular;  Laterality: N/A;   TONSILLECTOMY     VASECTOMY  1982   Family History  Problem Relation Age of Onset   Heart disease Paternal Grandfather        MI, old age   Stroke Mother    Lung cancer Maternal Grandfather        smoker   Stroke Maternal Grandfather    Colon cancer Neg Hx    Colon polyps Neg  Hx    Esophageal cancer Neg Hx    Rectal cancer Neg Hx    Stomach cancer Neg Hx    Bladder Cancer Neg Hx    Prostate cancer Neg Hx    Social History   Socioeconomic History   Marital status: Married    Spouse name: Remo Lipps   Number of children: 1   Years of education: 14   Highest education level: Associate degree: occupational, Hotel manager, or vocational program  Occupational History   Occupation: Retired    Fish farm manager: RETIRED    Comment: 1  Tobacco Use   Smoking status: Former Smoker    Packs/day: 2.00    Years: 20.00    Pack years: 40.00    Types: Cigarettes    Quit date: 08/07/1983    Years since quitting: 37.0   Smokeless tobacco: Never Used   Tobacco comment: quit over 30 years ago  Vaping Use   Vaping Use: Never used  Substance and Sexual Activity   Alcohol use: Yes    Alcohol/week: 1.0 standard drink    Types: 1 Shots of liquor per week    Comment: whiskey at night before bed   Drug use: No   Sexual activity: Yes    Partners: Female  Other Topics Concern   Not  on file  Social History Narrative   Married and lives with wife 1974   One daughter, not local   Retired from [employer], sea based Holiday representative   Social Determinants of Radio broadcast assistant Strain: Low Risk    Difficulty of Paying Living Expenses: Not hard at all  Food Insecurity: No Food Insecurity   Worried About Charity fundraiser in the Last Year: Never true   Arboriculturist in the Last Year: Never true  Transportation Needs: No Transportation Needs   Lack of Transportation (Medical): No   Lack of Transportation (Non-Medical): No  Physical Activity: Inactive   Days of Exercise per Week: 0 days   Minutes of Exercise per Session: 0 min  Stress: No Stress Concern Present   Feeling of Stress : Not at all  Social Connections: Not on file    Tobacco Counseling Counseling given: Not Answered Comment: quit over 30 years ago   Clinical Intake:  Pre-visit preparation completed: Yes  Pain : No/denies pain     Nutritional Risks: None Diabetes: No  How often do you need to have someone help you when you read instructions, pamphlets, or other written materials from your doctor or pharmacy?: 1 - Never What is the last grade level you completed in school?: 2 years of college  Diabetic: No Nutrition Risk Assessment:  Has the patient had any N/V/D within the last 2 months?  No  Does the patient have any non-healing wounds?  No  Has the patient had any unintentional weight loss or weight gain?  No   Diabetes:  Is the patient diabetic?  No  If diabetic, was a CBG obtained today?  N/A Did the patient bring in their glucometer from home?  N/A How often do you monitor your CBG's? N/A.   Financial Strains and Diabetes Management:  Are you having any financial strains with the device, your supplies or your medication? N/A.  Does the patient want to be seen by Chronic Care Management for management of their diabetes?  N/A Would the patient like to  be referred to a Nutritionist or for Diabetic Management?  N/A   Interpreter Needed?: No  Information  entered by :: CJohnson, LPN   Activities of Daily Living In your present state of health, do you have any difficulty performing the following activities: 08/16/2020  Hearing? N  Vision? N  Difficulty concentrating or making decisions? N  Walking or climbing stairs? N  Dressing or bathing? N  Doing errands, shopping? N  Preparing Food and eating ? N  Using the Toilet? N  In the past six months, have you accidently leaked urine? N  Do you have problems with loss of bowel control? N  Managing your Medications? N  Managing your Finances? N  Housekeeping or managing your Housekeeping? N  Some recent data might be hidden    Patient Care Team: Tonia Ghent, MD as PCP - General (Family Medicine) End, Harrell Gave, MD as PCP - Cardiology (Cardiology)  Indicate any recent Medical Services you may have received from other than [facility] providers in the past year (date may be approximate).     Assessment:   This is a routine wellness examination for [facility].  Hearing/Vision screen  Hearing Screening   _0  _1  _2  _3  _4  _5  _6  _7  _8   Right ear:           Left ear:           Vision Screening Comments: Patient gets annual eye exams  Dietary issues and exercise activities discussed: Current Exercise Habits: The patient does not participate in regular exercise at present, Exercise limited by: None identified  Goals     Increase physical activity     Starting 08/08/18, I will continue to exercise for 30 minutes daily.      Patient Stated     08/11/2019, I will increase my physical activity and exercise more.      Patient Stated     08/16/2020, I will maintain and continue medications as prescribed.       Depression Screen PHQ 2/9 Scores 08/16/2020 08/11/2019 08/08/2018 05/22/2018 07/25/2017 07/23/2016 07/22/2015  PHQ - 2 Score 0 0 0 0 0 0 0  PHQ- 9 Score  0 0 0 - 0 - -    Fall Risk Fall Risk  08/16/2020 08/11/2019 05/07/2019 08/08/2018 07/07/2018  Falls in the past year? 0 0 0 0 0  Number falls in past yr: 0 0 0 - 0  Injury with Fall? 0 0 0 - 0  Risk for fall due to : Medication side effect Medication side effect - - -  Follow up Falls evaluation completed;Falls prevention discussed Falls evaluation completed;Falls prevention discussed - - -    FALL RISK PREVENTION PERTAINING TO THE HOME:  Any stairs in or around the home? Yes  If so, are there any without handrails? No  Home free of loose throw rugs in walkways, pet beds, electrical cords, etc? Yes  Adequate lighting in your home to reduce risk of falls? Yes   ASSISTIVE DEVICES UTILIZED TO PREVENT FALLS:  Life alert? No  Use of a cane, walker or w/c? No  Grab bars in the bathroom? No  Shower chair or bench in shower? No  Elevated toilet seat or a handicapped toilet? No   TIMED UP AND GO:  Was the test performed? N/A, telephone visit .   Cognitive Function: MMSE - Mini Mental State Exam 08/16/2020 08/11/2019 08/08/2018 07/25/2017  Orientation to time _9 Orientation to Place _10 Registration _11 Attention/ Calculation 5 5 0 0  Recall _12 3  Language- name 2 objects - - 0 0  Language- repeat _0 Language- follow 3 step command - - 3 3  Language- read & follow direction - - 0 0  Write a sentence - - 0 0  Copy design - - 0 0  Total score - - 20 20  Mini Cog  Mini-Cog screen was completed. Maximum score is 22. A value of 0 denotes this part of the MMSE was not completed or the patient failed this part of the Mini-Cog screening.       Immunizations Immunization History  Administered Date(s) Administered   Fluad Quad(high Dose 65+) 05/07/2019   Influenza, High Dose Seasonal PF 04/14/2017   Influenza,inj,Quad PF,6+ Mos 04/16/2018   Influenza-Unspecified 05/06/2013, 04/20/2014, 05/07/2015, 04/06/2016, 04/14/2017, 04/16/2018, 05/06/2020   PFIZER  SARS-COV-2 Vaccination 08/29/2019, 09/19/2019, 05/05/2020   Pneumococcal Conjugate-13 07/20/2014   Pneumococcal Polysaccharide-23 07/16/2013   Td 02/19/2008   Zoster 09/30/2007   Zoster Recombinat (Shingrix) 06/11/2019, 08/17/2019    TDAP status: Due, Education has been provided regarding the importance of this vaccine. Advised may receive this vaccine at local pharmacy or Health Dept. Aware to provide a copy of the vaccination record if obtained from local pharmacy or Health Dept. Verbalized acceptance and understanding.  Flu Vaccine status: Up to date  Pneumococcal vaccine status: Up to date  Covid-19 vaccine status: Completed vaccines  Qualifies for Shingles Vaccine? Yes   Zostavax completed Yes   Shingrix Completed?: Yes  Screening Tests Health Maintenance  Topic Date Due   TETANUS/TDAP  08/08/2048 (Originally 02/18/2018)   COLONOSCOPY (Pts 45-62yr Insurance coverage will need to be confirmed)  10/19/2022   INFLUENZA VACCINE  Completed   COVID-19 Vaccine  Completed   Hepatitis C Screening  Completed   PNA vac Low Risk Adult  Completed    Health Maintenance  There are no preventive care reminders to display for this patient.  Colorectal cancer screening: Type of screening: Colonoscopy. Completed 10/19/2019. Repeat every 3 years  Lung Cancer Screening: (Low Dose CT Chest recommended if Age 74-80years, 30 pack-year currently smoking OR have quit w/in 15 years.) does not qualify.   Additional Screening:  Hepatitis C Screening: does qualify; Completed 10/20/2015  Vision Screening: Recommended annual ophthalmology exams for early detection of glaucoma and other disorders of the eye. Is the patient up to date with their annual eye exam?  Yes  Who is the provider or what is the name of the office in which the patient attends [facility] If pt is not established with a provider, would they like to be referred to a provider to establish  care? No .   Dental Screening: Recommended annual dental exams for proper oral hygiene  Community Resource Referral / Chronic Care Management: CRR required this visit?  No   CCM required this visit?  No      Plan:     I have personally reviewed and noted the following in the patients chart:    Medical and social history  Use of alcohol, tobacco or illicit drugs   Current medications and supplements  Functional ability and status  Nutritional status  Physical activity  Advanced directives  List of other physicians  Hospitalizations, surgeries, and ER visits in previous 12 months  Vitals  Screenings to include cognitive, depression, and falls  Referrals and appointments  In addition, I have reviewed and discussed with patient certain preventive protocols, quality metrics, and best practice recommendations. A  written personalized care plan for preventive services as well as general preventive health recommendations were provided to patient.   Due to this being a telephonic visit, the after visit summary with patients personalized plan was offered to patient via office or my-chart. Patient preferred to pick up at office at next visit or via mychart.  Andrez Grime, LPN   [id number]

## 2020-08-22 ENCOUNTER — Encounter: Payer: Medicare Other | Admitting: Family Medicine

## 2020-08-31 ENCOUNTER — Encounter: Payer: Self-pay | Admitting: Internal Medicine

## 2020-08-31 ENCOUNTER — Ambulatory Visit (INDEPENDENT_AMBULATORY_CARE_PROVIDER_SITE_OTHER): Payer: Medicare Other | Admitting: Internal Medicine

## 2020-08-31 ENCOUNTER — Other Ambulatory Visit: Payer: Self-pay

## 2020-08-31 VITALS — BP 110/60 | HR 96 | Ht 72.0 in | Wt 270.1 lb

## 2020-08-31 DIAGNOSIS — Z79899 Other long term (current) drug therapy: Secondary | ICD-10-CM

## 2020-08-31 DIAGNOSIS — I2510 Atherosclerotic heart disease of native coronary artery without angina pectoris: Secondary | ICD-10-CM | POA: Diagnosis not present

## 2020-08-31 DIAGNOSIS — I712 Thoracic aortic aneurysm, without rupture, unspecified: Secondary | ICD-10-CM

## 2020-08-31 DIAGNOSIS — I4819 Other persistent atrial fibrillation: Secondary | ICD-10-CM | POA: Diagnosis not present

## 2020-08-31 DIAGNOSIS — R0602 Shortness of breath: Secondary | ICD-10-CM | POA: Diagnosis not present

## 2020-08-31 DIAGNOSIS — I10 Essential (primary) hypertension: Secondary | ICD-10-CM

## 2020-08-31 DIAGNOSIS — E785 Hyperlipidemia, unspecified: Secondary | ICD-10-CM

## 2020-08-31 DIAGNOSIS — R0600 Dyspnea, unspecified: Secondary | ICD-10-CM

## 2020-08-31 DIAGNOSIS — I5032 Chronic diastolic (congestive) heart failure: Secondary | ICD-10-CM

## 2020-08-31 DIAGNOSIS — R0609 Other forms of dyspnea: Secondary | ICD-10-CM

## 2020-08-31 NOTE — Progress Notes (Signed)
Follow-up Outpatient Visit Date: 08/31/2020  Primary Care Provider: Tonia Ghent, MD [place] [address]  Chief Complaint: Dyspnea on exertion  HPI:  James [name] is a 75 y.o. male with history of coronaryartery diseasestatus post PCI to the RCA (05/2018), thoracic aortic aneurysm, HFpEF, persistent atrial fibrillation, hypertension, hyperlipidemia, hypothyroidism, and obesity, who presents for follow-up of CAD and thoracic aortic aneurysm.  I last saw him in October, which time James [name] reported feeling slightly more short of breath compared to a year ago.  We agreed to defer medication changes and additional testing.  Today, James [name] reports feeling about the same as at our prior visit.  He still has exertional dyspnea though he overall seems to be most limited by bilateral knee pain.  He is contemplating knee replacements.  He has not had any chest pain, palpitations, or lightheadedness.  He endorses minimal bilateral leg edema that is dependent.  He remains compliant with his medications, though he is uncertain if he is taking 50 mg or 100 mg daily of amiodarone.  He remains on anticoagulation without bleeding.  --------------------------------------------------------------------------------------------------  Past Medical History:  Diagnosis Date  . (HFpEF) heart failure with preserved ejection fraction ([place])    a. 05/2018 Echo: EF 55-60%, gr2 DD.  Marland Kitchen Acute lower GI bleeding after colonoscopy and polypectomy, resolved 12/12/2015  . Ascending aortic aneurysm ([place])    a. 02/2018 Echo: mildly dil Ao root/asc ao/arch; b. 05/2018 CTA Chest: 4.7cm fusiform aneurysm of Asc Ao.  Marland Kitchen CAD (coronary artery disease)    a. 05/2018 Cath/PCI: LM nl,LAD 30p, 90/99d/apical, LCX 53m OM1/2/3 min irregs, RCA 85p (3.5x18 [place].  . Cardiac murmur    a. 02/2018 Echo: EF 60-65%, no rwma, mild AI, mildly dil Ao root/Asc Ao/Arch, Mild MR, mildly dil LA. Nl RV fxn.  . Cataract     bilateral surgery to remove  . CHF (congestive heart failure) ([place]   . Colon polyp   . Constipation   . Essential hypertension   . Hemorrhoids   . Hepatitis    self resolved, likely food exposure, 1974.   .Marland KitchenHistory of cardiovascular stress test    a. 02/2018 Myoview: EF 55-65%, small/mild apical defect w/ nl wall motion ->attenuation artifact. No ischemia. Low risk study.  .Marland KitchenHx of adenomatous colonic polyps 12/05/2015  . Hyperlipidemia   . Hypothyroidism   . Mitral regurgitation    a. 110/2019 Echo: EF 55-60%. Gr2 DD. Triv AI. Ao root 371m Mild MR. Mod dil LA, mildly dil RA.  . Osteoarthritis   . Persistent atrial fibrillation ([place]   a. Dx 02/2018. CHA2DS2VASc = 2-->Eliquis; b. 03/2018 DCCV (200J); c. 03/2018 recurrent Afib-->flecainide started 04/2018;  d. 05/06/2018 s/p successful DCCV - 200J x 2-->on amio.  . Skin cancer    basal cell, R ear, MOHS   Past Surgical History:  Procedure Laterality Date  . BROW LIFT Bilateral 10/23/2016   Procedure: BLEPHAROPLASTY upper eyelid with excess skin;  Surgeon: AmKarle StarchMD;  [facility] Service: Ophthalmology;  Laterality: Bilateral;  MAC  . CARDIOVERSION N/A 03/18/2018   Procedure: CARDIOVERSION;  Surgeon: EnNelva BushMD;  [facility];  Service: Cardiovascular;  Laterality: N/A;  . CARDIOVERSION N/A 05/06/2018   Procedure: CARDIOVERSION;  Surgeon: EnNelva BushMD;  Location: [facility];  Service: Cardiovascular;  Laterality: N/A;  . CATARACT EXTRACTION  1986   OD  . CATARACT EXTRACTION Bilateral 1995   x 2  for right and left   . COLONOSCOPY    . CORONARY STENT INTERVENTION N/A 05/13/2018   Procedure: CORONARY STENT INTERVENTION;  Surgeon: Nelva Bush, MD;  Location: [facility];  Service: Cardiovascular;  Laterality: N/A;  . ECTROPION REPAIR Bilateral 10/23/2016   Procedure: REPAIR OF ECTROPION sutures, extensive;  Surgeon: Karle Starch, MD;  Location: [facility];  Service:  Ophthalmology;  Laterality: Bilateral;  . LIPOMA EXCISION  06/1997   left neck ([place])  . MOHS SURGERY Right 2013   behind right ear  . RIGHT/LEFT HEART CATH AND CORONARY ANGIOGRAPHY N/A 05/12/2018   Procedure: RIGHT/LEFT HEART CATH AND CORONARY ANGIOGRAPHY;  Surgeon: Wellington Hampshire, MD;  Location: [facility];  Service: Cardiovascular;  Laterality: N/A;  . TONSILLECTOMY    . VASECTOMY  1982    Current Meds  Medication Sig  . Acetaminophen (TYLENOL 8 HOUR PO) Take 200 mg by mouth. Taking PRN  . amiodarone (PACERONE) 100 MG tablet TAKE 1 TABLET BY MOUTH EVERY DAY (Patient taking differently: Take 50 mg by mouth daily.)  . apixaban (ELIQUIS) 5 MG TABS tablet TAKE 1 TABLET(5 MG) BY MOUTH TWICE DAILY  . atorvastatin (LIPITOR) 80 MG tablet Take 1 tablet (80 mg total) by mouth daily at 6 PM.  . DHA-EPA-Coenzyme Q10-Vitamin E (CO Q-10 VITAMIN E FISH OIL PO) Take 1 capsule by mouth daily.  Marland Kitchen doxazosin (CARDURA) 4 MG tablet Take 1 tablet (4 mg total) by mouth daily after breakfast.  . ezetimibe (ZETIA) 10 MG tablet TAKE 1 TABLET BY MOUTH DAILY GENERIC EQUIVALENT FOR ZETIA  . finasteride (PROSCAR) 5 MG tablet Take 1 tablet (5 mg total) by mouth daily.  . furosemide (LASIX) 20 MG tablet Take 1 tablet (20 mg total) by mouth daily.  Marland Kitchen levothyroxine (SYNTHROID) 150 MCG tablet Take 1 tablet (150 mcg total) by mouth daily before breakfast. Except take 1/2 tablet on Sundays.  Total of 6.5 tablets in 1 week.  . Multiple Vitamin (MULTIVITAMIN WITH MINERALS) TABS tablet Take 1 tablet by mouth daily. Senior Multivitamin  . polyethylene glycol (MIRALAX) 17 g packet Take 17 g by mouth 2 (two) times daily.  . ramipril (ALTACE) 10 MG capsule Take 1 capsule (10 mg total) by mouth daily.  . Vitamins/Minerals TABS Take by mouth.    Allergies: Sulfonamide derivatives  Social History   Tobacco Use  . Smoking status: Former Smoker    Packs/day: 2.00    Years: 20.00    Pack years: 40.00    Types:  Cigarettes    Quit date: 08/07/1983    Years since quitting: 37.0  . Smokeless tobacco: Never Used  . Tobacco comment: quit over 30 years ago  Vaping Use  . Vaping Use: Never used  Substance Use Topics  . Alcohol use: Yes    Alcohol/week: 1.0 standard drink    Types: 1 Shots of liquor per week    Comment: whiskey at night before bed  . Drug use: No    Family History  Problem Relation Age of Onset  . Heart disease Paternal Grandfather        MI, old age  . Stroke Mother   . Lung cancer Maternal Grandfather        smoker  . Stroke Maternal Grandfather   . Colon cancer Neg Hx   . Colon polyps Neg Hx   . Esophageal cancer Neg Hx   . Rectal cancer Neg Hx   . Stomach cancer Neg Hx   .  Bladder Cancer Neg Hx   . Prostate cancer Neg Hx     Review of Systems: A 12-system review of systems was performed and was negative except as noted in the HPI.  --------------------------------------------------------------------------------------------------  Physical Exam: BP 110/60 (BP Location: Left Arm, Patient Position: Sitting, Cuff Size: Large)   Pulse 96   Ht 6' (1.829 m)   Wt 270 lb 2 oz (122.5 kg)   SpO2 98%   BMI 36.64 kg/m   General:  NAD. Neck: No JVD or HJR. Lungs: Clear to auscultation bilaterally without wheezes or crackles. Heart: Regular rate and rhythm without murmurs, rubs, or gallops. Abdomen: Soft, nontender, nondistended. Extremities: Trace ankle edema bilaterally.  EKG: Normal sinus rhythm with isolated PVC, LAFB, and possible inferior infarct.  PVC is new from 05/16/2020.  Otherwise, there has been no significant interval change.  Lab Results  Component Value Date   WBC 9.1 04/28/2020   HGB 14.8 04/28/2020   HCT 43.5 04/28/2020   MCV 92.8 04/28/2020   PLT 145 (L) 04/28/2020    Lab Results  Component Value Date   NA 140 04/28/2020   K 4.3 04/28/2020   CL 106 04/28/2020   CO2 25 04/28/2020   BUN 20 04/28/2020   CREATININE 1.53 (H) 04/28/2020    GLUCOSE 94 04/28/2020   ALT 19 04/28/2020    Lab Results  Component Value Date   CHOL 125 04/28/2020   HDL 49 04/28/2020   LDLCALC 64 04/28/2020   TRIG 62 04/28/2020   CHOLHDL 2.6 04/28/2020    --------------------------------------------------------------------------------------------------  ASSESSMENT AND PLAN: Dyspnea on exertion and coronary artery disease: Overall this is relatively stable and may be due to deconditioning.  However, James [name] has known CAD status post PCI to the RCA in 2019, raising the question of exertional dyspnea as an anginal equivalent where he to have progressing coronary insufficiency.  As he may undergo knee surgery in the future, I have recommended that we repeat a myocardial perfusion stress test to exclude new areas of ischemia.  PFTs in 12/2019 in the setting of chronic amiodarone use were normal.  If myocardial perfusion stress test is normal, I think it would be okay to defer additional cardiac testing for now.  I do not appreciate a significant murmur on exam to suggest worsening of his mild to moderate mitral regurgitation.  Persistent atrial fibrillation: No symptoms to suggest recurrence.  Continue amiodarone (James [name] will clarify if he is taking 50 or 100 mg daily) as well as chronic anticoagulation with apixaban.  I will check a CBC, CMP, and TSH today for monitoring of these medications.  Thoracic aortic aneurysm: Stable thoracic aortic aneurysm measuring up to 4.6 cm again noted on most recent CTA in 05/2020.  We will continue annual follow-up with cross-sectional imaging.  Continue blood pressure and lipid control.  Fluoroquinolones should be avoided, if possible.  Hyperlipidemia: LDL well controlled on last check in 04/2020.  Continue atorvastatin 80 mg daily and ezetimibe 10 mg daily.  Hypertension and chronic HFpEF: Blood pressure well controlled today.  Minimal pedal edema noted on exam with stable NYHA class III symptoms.  Continue  ramipril 10 mg daily and furosemide 20 mg daily.  Follow-up: Return to clinic in 6 months.  Nelva Bush, MD 08/31/2020 9:06 AM

## 2020-08-31 NOTE — Patient Instructions (Signed)
Medication Instructions:  Your physician recommends that you continue on your current medications as directed. Please refer to the Current Medication list given to you today.  *If you need a refill on your cardiac medications before your next appointment, please call your pharmacy*   Lab Work: Your physician recommends that you return for lab work in: TODAY - CBC, CMP, TSH.  If you have labs (blood work) drawn today and your tests are completely normal, you will receive your results only by: Marland Kitchen MyChart Message (if you have MyChart) OR . A paper copy in the mail If you have any lab test that is abnormal or we need to change your treatment, we will call you to review the results.   Testing/Procedures: [place]  Your caregiver has ordered a Stress Test with nuclear imaging. The purpose of this test is to evaluate the blood supply to your heart muscle. This procedure is referred to as a "Non-Invasive Stress Test." This is because other than having an IV started in your vein, nothing is inserted or "invades" your body. Cardiac stress tests are done to find areas of poor blood flow to the heart by determining the extent of coronary artery disease (CAD). Some patients exercise on a treadmill, which naturally increases the blood flow to your heart, while others who are  unable to walk on a treadmill due to physical limitations have a pharmacologic/chemical stress agent called Lexiscan . This medicine will mimic walking on a treadmill by temporarily increasing your coronary blood flow.   Please note: these test may take anywhere between 2-4 hours to complete  PLEASE REPORT TO [facility] AT THE FIRST DESK WILL DIRECT YOU WHERE TO GO  Date of Procedure:_____________________________________  Arrival Time for Procedure:______________________________  Instructions regarding medication:   _xx_:  Hold other medications as follows:_________Furosemide______  PLEASE  NOTIFY THE OFFICE AT LEAST 24 HOURS IN ADVANCE IF YOU ARE UNABLE TO KEEP YOUR APPOINTMENT.  [phone] AND  PLEASE NOTIFY NUCLEAR MEDICINE AT [facility] AT LEAST 24 HOURS IN ADVANCE IF YOU ARE UNABLE TO KEEP YOUR APPOINTMENT. [phone]  How to prepare for your Myoview test:  1. Do not eat or drink after midnight 2. No caffeine for 24 hours prior to test 3. No smoking 24 hours prior to test. 4. Your medication may be taken with water.  If your doctor stopped a medication because of this test, do not take that medication. 5. Please wear a short sleeve shirt. 6. No perfume, cologne or lotion. 7. Wear comfortable walking shoes.  Follow-Up: At [facility], you and your health needs are our priority.  As part of our continuing mission to provide you with exceptional heart care, we have created designated Provider Care Teams.  These Care Teams include your primary Cardiologist (physician) and Advanced Practice Providers (APPs -  Physician Assistants and Nurse Practitioners) who all work together to provide you with the care you need, when you need it.  We recommend signing up for the patient portal called "MyChart".  Sign up information is provided on this After Visit Summary.  MyChart is used to connect with patients for Virtual Visits (Telemedicine).  Patients are able to view lab/test results, encounter notes, upcoming appointments, etc.  Non-urgent messages can be sent to your provider as well.   To learn more about what you can do with MyChart, go to [email or web address].    Your next appointment:   6 month(s)  The format for your next appointment:  In Person  Provider:   You may see Nelva Bush, MD or one of the following Advanced Practice Providers on your designated Care Team:    Murray Hodgkins, NP  Christell Faith, PA-C  Marrianne Mood, PA-C  Cadence Kathlen Mody, [place]  Laurann [place], NP    Cardiac Nuclear Scan A cardiac nuclear scan is a test that measures blood flow  to the heart when a person is resting and when he or she is exercising. The test looks for problems such as:  Not enough blood reaching a portion of the heart.  The heart muscle not working normally. You may need this test if:  You have heart disease.  You have had abnormal lab results.  You have had heart surgery or a balloon procedure to open up blocked arteries (angioplasty).  You have chest pain.  You have shortness of breath. In this test, a radioactive dye (tracer) is injected into your bloodstream. After the tracer has traveled to your heart, an imaging device is used to measure how much of the tracer is absorbed by or distributed to various areas of your heart. This procedure is usually done at a hospital and takes 2-4 hours. Tell a health care provider about:  Any allergies you have.  All medicines you are taking, including vitamins, herbs, eye drops, creams, and over-the-counter medicines.  Any problems you or family members have had with anesthetic medicines.  Any blood disorders you have.  Any surgeries you have had.  Any medical conditions you have.  Whether you are pregnant or may be pregnant. What are the risks? Generally, this is a safe procedure. However, problems may occur, including:  Serious chest pain and heart attack. This is only a risk if the stress portion of the test is done.  Rapid heartbeat.  Sensation of warmth in your chest. This usually passes quickly.  Allergic reaction to the tracer. What happens before the procedure?  Ask your health care provider about changing or stopping your regular medicines. This is especially important if you are taking diabetes medicines or blood thinners.  Follow instructions from your health care provider about eating or drinking restrictions.  Remove your jewelry on the day of the procedure. What happens during the procedure?  An IV will be inserted into one of your veins.  Your health care provider  will inject a small amount of radioactive tracer through the IV.  You will wait for 20-40 minutes while the tracer travels through your bloodstream.  Your heart activity will be monitored with an electrocardiogram (ECG).  You will lie down on an exam table.  Images of your heart will be taken for about 15-20 minutes.  You may also have a stress test. For this test, one of the following may be done: ? You will exercise on a treadmill or stationary bike. While you exercise, your heart's activity will be monitored with an ECG, and your blood pressure will be checked. ? You will be given medicines that will increase blood flow to parts of your heart. This is done if you are unable to exercise.  When blood flow to your heart has peaked, a tracer will again be injected through the IV.  After 20-40 minutes, you will get back on the exam table and have more images taken of your heart.  Depending on the type of tracer used, scans may need to be repeated 3-4 hours later.  Your IV line will be removed when the procedure is over. The procedure may  vary among health care providers and hospitals. What happens after the procedure?  Unless your health care provider tells you otherwise, you may return to your normal schedule, including diet, activities, and medicines.  Unless your health care provider tells you otherwise, you may increase your fluid intake. This will help to flush the contrast dye from your body. Drink enough fluid to keep your urine pale yellow.  Ask your health care provider, or the department that is doing the test: ? When will my results be ready? ? How will I get my results? Summary  A cardiac nuclear scan measures the blood flow to the heart when a person is resting and when he or she is exercising.  Tell your health care provider if you are pregnant.  Before the procedure, ask your health care provider about changing or stopping your regular medicines. This is especially  important if you are taking diabetes medicines or blood thinners.  After the procedure, unless your health care provider tells you otherwise, increase your fluid intake. This will help flush the contrast dye from your body.  After the procedure, unless your health care provider tells you otherwise, you may return to your normal schedule, including diet, activities, and medicines. This information is not intended to replace advice given to you by your health care provider. Make sure you discuss any questions you have with your health care provider. Document Revised: 01/06/2018 Document Reviewed: 01/06/2018 Elsevier Patient Education  [place].

## 2020-09-01 LAB — COMPREHENSIVE METABOLIC PANEL
ALT: 13 IU/L (ref 0–44)
AST: 21 IU/L (ref 0–40)
Albumin/Globulin Ratio: 2 (ref 1.2–2.2)
Albumin: 4.2 g/dL (ref 3.7–4.7)
Alkaline Phosphatase: 76 IU/L (ref 44–121)
BUN/Creatinine Ratio: 12 (ref 10–24)
BUN: 16 mg/dL (ref 8–27)
Bilirubin Total: 0.9 mg/dL (ref 0.0–1.2)
CO2: 22 mmol/L (ref 20–29)
Calcium: 9.4 mg/dL (ref 8.6–10.2)
Chloride: 104 mmol/L (ref 96–106)
Creatinine, Ser: 1.34 mg/dL — ABNORMAL HIGH (ref 0.76–1.27)
GFR calc Af Amer: 60 mL/min/{1.73_m2} (ref >59)
GFR calc non Af Amer: 52 mL/min/{1.73_m2} — ABNORMAL LOW (ref >59)
Globulin, Total: 2.1 g/dL (ref 1.5–4.5)
Glucose: 126 mg/dL — ABNORMAL HIGH (ref 65–99)
Potassium: 4.4 mmol/L (ref 3.5–5.2)
Sodium: 145 mmol/L — ABNORMAL HIGH (ref 134–144)
Total Protein: 6.3 g/dL (ref 6.0–8.5)

## 2020-09-01 LAB — CBC
Hematocrit: 44.9 % (ref 37.5–51.0)
Hemoglobin: 15.1 g/dL (ref 13.0–17.7)
MCH: 31.3 pg (ref 26.6–33.0)
MCHC: 33.6 g/dL (ref 31.5–35.7)
MCV: 93 fL (ref 79–97)
Platelets: 137 10*3/uL — ABNORMAL LOW (ref 150–450)
RBC: 4.83 x10E6/uL (ref 4.14–5.80)
RDW: 12.2 % (ref 11.6–15.4)
WBC: 8.2 10*3/uL (ref 3.4–10.8)

## 2020-09-01 LAB — TSH: TSH: 0.588 u[IU]/mL (ref 0.450–4.500)

## 2020-09-04 ENCOUNTER — Other Ambulatory Visit: Payer: Self-pay | Admitting: Family Medicine

## 2020-09-05 ENCOUNTER — Other Ambulatory Visit: Payer: Self-pay

## 2020-09-05 ENCOUNTER — Ambulatory Visit (INDEPENDENT_AMBULATORY_CARE_PROVIDER_SITE_OTHER): Payer: Medicare Other | Admitting: Family Medicine

## 2020-09-05 ENCOUNTER — Telehealth: Payer: Self-pay | Admitting: Internal Medicine

## 2020-09-05 ENCOUNTER — Encounter: Payer: Self-pay | Admitting: Family Medicine

## 2020-09-05 VITALS — BP 118/76 | HR 81 | Temp 97.3°F | Ht 72.0 in | Wt 268.0 lb

## 2020-09-05 DIAGNOSIS — M25569 Pain in unspecified knee: Secondary | ICD-10-CM | POA: Diagnosis not present

## 2020-09-05 DIAGNOSIS — E7800 Pure hypercholesterolemia, unspecified: Secondary | ICD-10-CM | POA: Diagnosis not present

## 2020-09-05 DIAGNOSIS — I509 Heart failure, unspecified: Secondary | ICD-10-CM | POA: Diagnosis not present

## 2020-09-05 DIAGNOSIS — Z0000 Encounter for general adult medical examination without abnormal findings: Secondary | ICD-10-CM

## 2020-09-05 DIAGNOSIS — Z7189 Other specified counseling: Secondary | ICD-10-CM

## 2020-09-05 DIAGNOSIS — E039 Hypothyroidism, unspecified: Secondary | ICD-10-CM

## 2020-09-05 MED ORDER — AMIODARONE HCL 100 MG PO TABS
50.0000 mg | ORAL_TABLET | Freq: Every day | ORAL | Status: DC
Start: 2020-09-05 — End: 2021-05-25

## 2020-09-05 MED ORDER — FUROSEMIDE 20 MG PO TABS
ORAL_TABLET | ORAL | Status: DC
Start: 2020-09-05 — End: 2021-01-31

## 2020-09-05 MED ORDER — DOXAZOSIN MESYLATE 4 MG PO TABS
4.0000 mg | ORAL_TABLET | Freq: Every day | ORAL | 0 refills | Status: DC
Start: 2020-09-05 — End: 2020-12-20

## 2020-09-05 MED ORDER — ATORVASTATIN CALCIUM 80 MG PO TABS
80.0000 mg | ORAL_TABLET | Freq: Every day | ORAL | 3 refills | Status: DC
Start: 2020-09-05 — End: 2021-09-04

## 2020-09-05 MED ORDER — DICLOFENAC SODIUM 1 % EX GEL: 2.0000 g | Freq: Four times a day (QID) | CUTANEOUS | Status: DC | PRN

## 2020-09-05 NOTE — Progress Notes (Signed)
This visit occurred during the SARS-CoV-2 public health emergency.  Safety protocols were in place, including screening questions prior to the visit, additional usage of staff PPE, and extensive cleaning of exam room while observing appropriate contact time as indicated for disinfecting solutions.  Shingrix 2021 Flu 2021 Tetanus 2009. PNA up to date.  covid vaccine 2021 Prostate cancer screening and PSA options(with potential risks and benefits of testing vs not testing) were discussed along with recent recs/guidelines. He declined testing PSAat this point. Colonoscopy 2021 Wife would be designated ifpatient wereincapacitated.   Diet and exercise d/w pt.Exercise limited by knee pain.  Discussed, he was prev planning on surgery prior to cardiac eval/tx.  We talked about getting stress test done prior to making ortho referral. L>R knee pain with walking.  He had prev knee aspiration done.   Memory d/w pt.  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    CHF/Hypertension/AF.    Using medication without problems or lightheadedness: yes Chest pain with exertion:no Edema:no Short of breath: some, with exertion, unclear if from deconditioning vs CAD with stress test pending.   Anticoagulated. No bleeding.  No heart racing.    Elevated Cholesterol: Using medications without problems: yes Muscle aches: no Diet compliance: encouraged.   Exercise: see above.   Labs d/w pt.    He has noted some motor changes with separating papers but not using a fork or a pen.  The main issue is handling papers. Gross sensation and motor intact w/o tremor.  We agreed to observe this for now.  No tremor noted on exam today.  Hypothyroidism.  TSH wnl.  No neck mass, no dysphagia except occ throat irritation at night, resolved with rolling over. Recent labs d/w pt.  Some snoring, but not having known apnea.    Meds, vitals, and allergies reviewed.   PMH and SH  reviewed  ROS: Per HPI unless specifically indicated in ROS section   GEN: nad, alert and oriented HEENT: ncat NECK: supple w/o LA CV: rrr. PULM: ctab, no inc wob ABD: soft, +bs EXT: trace BLE edema SKIN: well perfused.  Normal sensation to monofilament and normal ROM hands.  No tremor.    30 minutes were devoted to patient care in this encounter (this includes time spent reviewing the patient's file/history, interviewing and examining the patient, counseling/reviewing plan with patient).

## 2020-09-05 NOTE — Patient Instructions (Signed)
I'll await the notes from cardiology and we can set you up with ortho after that.  Please verify with me that you'll want the ortho referral.  Update me as needed.  Take care.  Glad to see you.

## 2020-09-05 NOTE — Telephone Encounter (Signed)
Received fax from [facility] requesting refills for Atorvastatin 80 mg. Rx request sent to pharmacy.

## 2020-09-07 NOTE — Assessment & Plan Note (Signed)
TSH normal, continue levothyroxine as is.  He agrees.  He will update me as needed.

## 2020-09-07 NOTE — Assessment & Plan Note (Signed)
Shingrix 2021 Flu 2021 Tetanus 2009. PNA up to date.  covid vaccine 2021 Prostate cancer screening and PSA options(with potential risks and benefits of testing vs not testing) were discussed along with recent recs/guidelines. He declined testing PSAat this point. Colonoscopy 2021 Wife would be designated ifpatient wereincapacitated.   Diet and exercise d/w pt.Exercise limited by knee pain.  Discussed, he was prev planning on surgery prior to cardiac eval/tx.  We talked about getting stress test done prior to making ortho referral. L>R knee pain with walking.  He had prev knee aspiration done.   Memory d/w pt.  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

## 2020-09-07 NOTE — Assessment & Plan Note (Signed)
Short of breath: some, with exertion, unclear if from deconditioning vs CAD with stress test pending.   Anticoagulated. No bleeding.  No heart racing.   I think it makes sense not to change in medications right now and await his stress test.  He agrees.  Continue amiodarone and Eliquis atorvastatin doxazosin Zetia furosemide and ramipril.

## 2020-09-07 NOTE — Assessment & Plan Note (Signed)
Defer Ortho referral until he has follow-up with cardiology for stress test.  He agrees.  He will let me know if he needs a referral.

## 2020-09-07 NOTE — Assessment & Plan Note (Signed)
Continue Zetia and atorvastatin.  Labs discussed with patient.  He agrees.

## 2020-09-09 ENCOUNTER — Other Ambulatory Visit: Payer: Self-pay

## 2020-09-09 ENCOUNTER — Encounter
Admission: RE | Admit: 2020-09-09 | Discharge: 2020-09-09 | Disposition: A | Discharge status: Home / Self Care | Payer: Medicare Other | Source: Ambulatory Visit | Attending: Internal Medicine | Admitting: Internal Medicine

## 2020-09-09 DIAGNOSIS — Z79899 Other long term (current) drug therapy: Secondary | ICD-10-CM

## 2020-09-09 DIAGNOSIS — I2510 Atherosclerotic heart disease of native coronary artery without angina pectoris: Secondary | ICD-10-CM

## 2020-09-09 DIAGNOSIS — R0600 Dyspnea, unspecified: Secondary | ICD-10-CM

## 2020-09-09 DIAGNOSIS — R0609 Other forms of dyspnea: Secondary | ICD-10-CM

## 2020-09-09 LAB — NM MYOCAR MULTI W/SPECT W/WALL MOTION / EF
Estimated workload: 1 METS
Exercise duration (min): 0 min
Exercise duration (sec): 0 s
LV dias vol: 98 mL (ref 62–150)
LV sys vol: 46 mL
MPHR: 147 {beats}/min
Peak HR: 73 {beats}/min
Percent HR: 49 %
Rest HR: 54 {beats}/min
SDS: 5
SRS: 7
SSS: 12
TID: 0.99

## 2020-09-09 MED ORDER — REGADENOSON 0.4 MG/5ML IV SOLN
0.4000 mg | Freq: Once | INTRAVENOUS | Status: AC
  Administered 2020-09-09: 0.4 mg via INTRAVENOUS

## 2020-09-09 MED ORDER — TECHNETIUM TC 99M TETROFOSMIN IV KIT
32.2110 | PACK | Freq: Once | INTRAVENOUS | Status: AC | PRN
  Administered 2020-09-09: 32.211 via INTRAVENOUS

## 2020-09-09 MED ORDER — TECHNETIUM TC 99M TETROFOSMIN IV KIT
10.3000 | PACK | Freq: Once | INTRAVENOUS | Status: AC | PRN
  Administered 2020-09-09: 10.3 via INTRAVENOUS

## 2020-09-20 DIAGNOSIS — H16213 Exposure keratoconjunctivitis, bilateral: Secondary | ICD-10-CM | POA: Diagnosis not present

## 2020-10-25 ENCOUNTER — Other Ambulatory Visit: Payer: Self-pay | Admitting: Family Medicine

## 2020-11-07 ENCOUNTER — Ambulatory Visit: Payer: Medicare Other

## 2020-11-15 ENCOUNTER — Other Ambulatory Visit: Payer: Self-pay

## 2020-11-15 DIAGNOSIS — I4891 Unspecified atrial fibrillation: Secondary | ICD-10-CM

## 2020-11-15 DIAGNOSIS — D225 Melanocytic nevi of trunk: Secondary | ICD-10-CM | POA: Diagnosis not present

## 2020-11-15 DIAGNOSIS — Z85828 Personal history of other malignant neoplasm of skin: Secondary | ICD-10-CM | POA: Diagnosis not present

## 2020-11-15 DIAGNOSIS — Z85820 Personal history of malignant melanoma of skin: Secondary | ICD-10-CM | POA: Diagnosis not present

## 2020-11-15 DIAGNOSIS — Z86006 Personal history of melanoma in-situ: Secondary | ICD-10-CM | POA: Diagnosis not present

## 2020-11-15 MED ORDER — ELIQUIS 5 MG PO TABS: ORAL_TABLET | ORAL | 1 refills | Status: DC

## 2020-12-19 ENCOUNTER — Telehealth: Payer: Self-pay | Admitting: Family Medicine

## 2020-12-19 NOTE — Telephone Encounter (Signed)
  LAST APPOINTMENT DATE: 10/25/2020   NEXT APPOINTMENT DATE:@Visit  date not found  MEDICATION: DOXAZOSIN   PHARMACY: [facility].  Let patient know to contact pharmacy at the end of the day to make sure medication is ready.  Please notify patient to allow 48-72 hours to process  Encourage patient to contact the pharmacy for refills or they can request refills through [facility]:   LAST REFILL:  QTY:  REFILL DATE:    OTHER COMMENTS:    Okay for refill?  Please advise

## 2020-12-20 ENCOUNTER — Encounter: Payer: Self-pay | Admitting: Family Medicine

## 2020-12-20 MED ORDER — DOXAZOSIN MESYLATE 4 MG PO TABS
4.0000 mg | ORAL_TABLET | Freq: Every day | ORAL | 1 refills | Status: DC
Start: 2020-12-20 — End: 2021-07-17

## 2020-12-20 NOTE — Addendum Note (Signed)
Addended by: Sherrilee Gilles B on: 12/20/2020 08:37 AM   Modules accepted: Orders

## 2020-12-20 NOTE — Telephone Encounter (Signed)
Erx sent

## 2021-01-10 ENCOUNTER — Telehealth: Payer: Self-pay | Admitting: Internal Medicine

## 2021-01-10 ENCOUNTER — Other Ambulatory Visit: Payer: Self-pay

## 2021-01-10 MED ORDER — EZETIMIBE 10 MG PO TABS: ORAL_TABLET | ORAL | 0 refills | Status: DC

## 2021-01-10 NOTE — Telephone Encounter (Signed)
ezetimibe (ZETIA) 10 MG tablet 90 tablet 0 01/10/2021    Sig: TAKE 1 TABLET BY MOUTH DAILY GENERIC EQUIVALENT FOR ZETIA   Sent to pharmacy as: ezetimibe (ZETIA) 10 MG tablet   Notes to Pharmacy: Please call to schedule your 6 month follow up appt - [phone]   E-Prescribing Status: Receipt confirmed by pharmacy (01/10/2021  2:50 PM EDT)     Pharmacy  [facility] [place], [place]

## 2021-01-10 NOTE — Telephone Encounter (Signed)
*  STAT* If patient is at the pharmacy, call can be transferred to refill team.   1. Which medications need to be refilled? (please list name of each medication and dose if known)  Ezetimibe (ZETIA) 10 MG daily  2. Which pharmacy/location (including street and city if local pharmacy) is medication to be sent to? [facility]  3. Do they need a 30 day or 90 day supply? 90 day

## 2021-01-31 ENCOUNTER — Other Ambulatory Visit: Payer: Self-pay

## 2021-01-31 MED ORDER — FUROSEMIDE 20 MG PO TABS: ORAL_TABLET | ORAL | 1 refills | Status: DC

## 2021-02-07 ENCOUNTER — Telehealth: Payer: Self-pay

## 2021-02-07 NOTE — Telephone Encounter (Signed)
Received call from pharmacy states that script was changed for furosemide from daily to every other day. Quantity was sent in for 90. If taking every other day should be for 45. Would like ok to change.   Call [phone] opt 9

## 2021-02-08 NOTE — Telephone Encounter (Signed)
Please give the order to change to #45 and update patient.  Thanks.

## 2021-02-09 MED ORDER — FUROSEMIDE 20 MG PO TABS
ORAL_TABLET | ORAL | 1 refills | Status: DC
Start: 2021-02-09 — End: 2022-01-12

## 2021-02-09 NOTE — Addendum Note (Signed)
Addended by: Sherrilee Gilles B on: 02/09/2021 03:11 PM   Modules accepted: Orders

## 2021-02-09 NOTE — Telephone Encounter (Signed)
New rx has been sent to [facility] and patient updated.

## 2021-03-30 ENCOUNTER — Other Ambulatory Visit: Payer: Self-pay | Admitting: Internal Medicine

## 2021-05-03 ENCOUNTER — Other Ambulatory Visit: Payer: Self-pay

## 2021-05-03 DIAGNOSIS — I4891 Unspecified atrial fibrillation: Secondary | ICD-10-CM

## 2021-05-03 MED ORDER — APIXABAN 5 MG PO TABS: ORAL_TABLET | ORAL | 1 refills | Status: DC

## 2021-05-03 NOTE — Progress Notes (Signed)
05/08/21 1:02 PM   James [name] 05/14/1946 [id number]  Referring provider:  Tonia Ghent, MD [address] [place],   [address]  Chief Complaint  Patient presents with   Urinary Retention   Urological history  1. BPH with LUTS  - Managed on doxazosin 4 mg daily and finasteride 5 mg daily PSA trend:  04/2018: 0.6(on finasteride 2018: 0.7 (on finasteride) 2017: 2.88 -I PSS 18/3 -PVR 5 mL    HPI: James [name] is a 75 y.o.male who presents today for further evaluation of trouble emptying bladder.   Urine today showed 3-10 RBCs.  He reports that in the last two weeks he has experienced nocturia with a weak stream.   He denies any modifying or aggravating factors.  Patient denies any gross hematuria, dysuria or suprapubic/flank pain.  Patient denies any fevers, chills, nausea or vomiting.     IPSS     Row Name 05/08/21 1200         International Prostate Symptom Score   How often have you had the sensation of not emptying your bladder? Not at All     How often have you had to urinate less than every two hours? Less than half the time     How often have you found you stopped and started again several times when you urinated? Almost always     How often have you found it difficult to postpone urination? Not at All     How often have you had a weak urinary stream? Almost always     How often have you had to strain to start urination? About half the time     How many times did you typically get up at night to urinate? 3 Times     Total IPSS Score 18           Quality of Life due to urinary symptoms   If you were to spend the rest of your life with your urinary condition just the way it is now how would you feel about that? Mixed              Score:  1-7 Mild 8-19 Moderate 20-35 Severe    PMH: Past Medical History:  Diagnosis Date   (HFpEF) heart failure with preserved ejection fraction ([place])    a. 05/2018 Echo: EF 55-60%, gr2 DD.    Acute lower GI bleeding after colonoscopy and polypectomy, resolved 12/12/2015   Ascending aortic aneurysm ([place])    a. 02/2018 Echo: mildly dil Ao root/asc ao/arch; b. 05/2018 CTA Chest: 4.7cm fusiform aneurysm of Asc Ao.   CAD (coronary artery disease)    a. 05/2018 Cath/PCI: LM nl,LAD 30p, 90/99d/apical, LCX 12m OM1/2/3 min irregs, RCA 85p (3.5x18 [place].   Cardiac murmur    a. 02/2018 Echo: EF 60-65%, no rwma, mild AI, mildly dil Ao root/Asc Ao/Arch, Mild MR, mildly dil LA. Nl RV fxn.   Cataract    bilateral surgery to remove   CHF (congestive heart failure) (HCC)    Colon polyp    Constipation    Essential hypertension    Hemorrhoids    Hepatitis    self resolved, likely food exposure, 1974.    History of cardiovascular stress test    a. 02/2018 Myoview: EF 55-65%, small/mild apical defect w/ nl wall motion ->attenuation artifact. No ischemia. Low risk study.   Hx of adenomatous colonic polyps 12/05/2015   Hyperlipidemia    Hypothyroidism    Mitral  regurgitation    a. 110/2019 Echo: EF 55-60%. Gr2 DD. Triv AI. Ao root 64m. Mild MR. Mod dil LA, mildly dil RA.   Osteoarthritis    Persistent atrial fibrillation ([place]    a. Dx 02/2018. CHA2DS2VASc = 2-->Eliquis; b. 03/2018 DCCV (200J); c. 03/2018 recurrent Afib-->flecainide started 04/2018;  d. 05/06/2018 s/p successful DCCV - 200J x 2-->on amio.   Skin cancer    basal cell, R ear, MOHS    Surgical History: Past Surgical History:  Procedure Laterality Date   BROW LIFT Bilateral 10/23/2016   Procedure: BLEPHAROPLASTY upper eyelid with excess skin;  Surgeon: AKarle Starch MD;  [place] [facility] [facility]  Service: Ophthalmology;  Laterality: Bilateral;  MAC   CARDIOVERSION N/A 03/18/2018   Procedure: CARDIOVERSION;  Surgeon: ENelva Bush MD;  [facility];  Service: Cardiovascular;  Laterality: N/A;   CARDIOVERSION N/A 05/06/2018   Procedure: CARDIOVERSION;  Surgeon: ENelva Bush MD;  Location: [facility];  Service:  Cardiovascular;  Laterality: N/A;   CATARACT EXTRACTION  1986   OD   CATARACT EXTRACTION Bilateral 1995   x 2 for right and left    COLONOSCOPY     CORONARY STENT INTERVENTION N/A 05/13/2018   Procedure: CORONARY STENT INTERVENTION;  Surgeon: ENelva Bush MD;  [facility];  Service: Cardiovascular;  Laterality: N/A;   ECTROPION REPAIR Bilateral 10/23/2016   Procedure: REPAIR OF ECTROPION sutures, extensive;  Surgeon: AKarle Starch MD;  [place] [facility]  Service: Ophthalmology;  Laterality: Bilateral;   LIPOMA EXCISION  06/1997   left neck (Juengel)   MOHS SURGERY Right 2013   behind right ear   RIGHT/LEFT HEART CATH AND CORONARY ANGIOGRAPHY N/A 05/12/2018   Procedure: RIGHT/LEFT HEART CATH AND CORONARY ANGIOGRAPHY;  Surgeon: AWellington Hampshire MD;  [facility];  Service: Cardiovascular;  Laterality: N/A;   TONSILLECTOMY     VASECTOMY  1982    Home Medications:  Allergies as of 05/04/2021       Reactions   Sulfonamide Derivatives Rash        Medication List        Accurate as of May 04, 2021 11:59 PM. If you have any questions, ask your nurse or doctor.          amiodarone 100 MG tablet Commonly known as: PACERONE Take 0.5 tablets (50 mg total) by mouth daily.   apixaban 5 MG Tabs tablet Commonly known as: Eliquis TAKE 1 TABLET(5 MG) BY MOUTH TWICE DAILY   atorvastatin 80 MG tablet Commonly known as: LIPITOR Take 1 tablet (80 mg total) by mouth daily at 6 PM.   CO Q-10 VITAMIN E FISH OIL PO Take 1 capsule by mouth daily.   diclofenac Sodium 1 % Gel Commonly known as: VOLTAREN Apply 2 g topically 4 (four) times daily as needed.   doxazosin 4 MG tablet Commonly known as: CARDURA Take 1 tablet (4 mg total) by mouth daily after breakfast.   ezetimibe 10 MG tablet Commonly known as: ZETIA TAKE 1 TABLET BY MOUTH DAILY. NEED APPOINTMENT FOR FURTHER REFILLS. GENERIC EQUIVALENT FOR ZETIA   finasteride 5  MG tablet Commonly known as: PROSCAR TAKE 1 TABLET BY MOUTH EVERY DAY   furosemide 20 MG tablet Commonly known as: LASIX Take 1 tablet (20 mg total) by mouth every other day   levothyroxine 150 MCG tablet Commonly known as: SYNTHROID Take 1 tablet (150 mcg total) by mouth daily before breakfast. Except take 1/2 tablet on  _0  - 29 mmol/L 22  Calcium     8.6 - 10.2 mg/dL 9.4  Total Protein     6.0 - 8.5 g/dL 6.3  Albumin     3.7 - 4.7 g/dL 4.2  Globulin, Total     1.5 - 4.5 g/dL 2.1  Albumin/Globulin Ratio     1.2 - 2.2 2.0  Total Bilirubin  0.0 - 1.2 mg/dL 0.9  Alkaline Phosphatase     44 - 121 IU/L 76  AST     0 - 40 IU/L 21  ALT     0 - 44 IU/L 13   Component     Latest Ref Rng & Units 08/31/2020  WBC     3.4 - 10.8 x10E3/uL 8.2  RBC     4.14 - 5.80 x10E6/uL 4.83  Hemoglobin     13.0 - 17.7 g/dL 15.1  HCT     37.5 - 51.0 % 44.9  MCV     79 - 97 fL 93  MCH     26.6 - 33.0 pg 31.3  MCHC     31.5 - 35.7 g/dL 33.6  RDW     11.6 - 15.4 % 12.2  Platelets     150 - 450 x10E3/uL 137 (L)  Neutrophils     43.0 - 77.0 %   Lymphs     Not Estab. %   Monocytes     Not Estab. %   Eos     Not Estab. %   Basos     Not Estab. %   NEUT#     1.4 - 7.7 K/uL   Lymphocyte #     0.7 - 4.0 K/uL   Monocytes Absolute     0.1 - 0.9 x10E3/uL   EOS (ABSOLUTE)     0.0 - 0.4 x10E3/uL   Basophils  Absolute     0.0 - 0.1 K/uL   Immature Granulocytes     Not Estab. %   Immature Grans (Abs)     0.0 - 0.1 x10E3/uL   Lymphocytes     12.0 - 46.0 %   Monocytes Relative     3.0 - 12.0 %   Eosinophil     0.0 - 5.0 %   Basophil     0.0 - 3.0 %   Monocyte #     0.1 - 1.0 K/uL   Eosinophils Absolute     0.0 - 0.7 K/uL   nRBC     0.0 - 0.2 %    Results for orders placed or performed in visit on 05/04/21  Microscopic Examination   Urine  Result Value Ref Range   WBC, UA 0-5 0 - 5 /hpf   RBC 3-10 (A) 0 - 2 /hpf   Epithelial Cells (non renal) 0-10 0 - 10 /hpf   Casts Present (A) None seen /lpf   Cast Type Hyaline casts N/A   Bacteria, UA None seen None seen/Few  Urinalysis, Complete  Result Value Ref Range   Specific Gravity, UA 1.015 1.005 - 1.030   pH, UA 6.5 5.0 - 7.5   Color, UA Yellow Yellow   Appearance Ur Clear Clear   Leukocytes,UA Negative Negative   Protein,UA Negative Negative/Trace   Glucose, UA Negative Negative   Ketones, UA Negative Negative   RBC, UA Trace (A) Negative   Bilirubin, UA Negative Negative   Urobilinogen, Ur 0.2 0.2 - 1.0 mg/dL   Nitrite, UA Negative Negative   Microscopic Examination See below:   Bladder Scan (Post Void Residual) in office  Result Value Ref Range   Scan Result 5 mL   I have reviewed the labs.   Pertinent Imaging: Results for ARTEMUS, ROMANOFF "TOM" (MRN [id number]) as of 05/08/2021 12:56  Ref. Range 05/08/2021 12:55  Scan Result Unknown 5 mL     Assessment & Plan:  BPH with LUTS   -  PSA screening discontinued -UA 3-10 RBC's -PVR < 300 cc -symptoms - weak urinary stream  -continue doxazosin 4 mg daily and finasteride 5 mg daily -cysto pending   Microscopic hematuria  - I explained to the patient that there are a number of causes that can be associated with blood in the urine, such as stones, BPH, UTI's, damage to the urinary tract and/or cancer. -at this time, they are in a high risk stratification for hematuria per  AUA guidelines due to their age (>60 years) and > 30 pack year smoking history -recommended studies for high risk are CT urogram and cysto - I explained to the patient that a contrast material will be injected into a vein and that in rare instances, an allergic reaction can result and may even life threatening (1:100,000)  The patient denies any allergies to contrast, iodine and/or seafood and is not taking metformin - Following the imaging study,  I've recommended a cystoscopy. I described how this is performed, typically in an office setting with a flexible cystoscope. We described the risks, benefits, and possible side effects, the most common of which is a minor amount of blood in the urine and/or burning which usually resolves in 24 to 48 hours.   - The patient had the opportunity to ask questions which were answered. Based upon this discussion, the patient is willing to proceed. Therefore, I've ordered: a CT Urogram and cystoscopy. - The patient will return following all of the above for discussion of the results.  - UA    Return for CT Urogram report and cystoscopy for micro heme .  [facility] [address], [place], [place] [address] [phone]  [facility] as a scribe for [facility], PA-C.,have documented all relevant documentation on the behalf of Vikram Tillett, PA-C,as directed by  [facility], PA-C while in the presence of Nikolis Berent, PA-C.  I have reviewed the above documentation for accuracy and completeness, and I agree with the above.    Zara Council, PA-C

## 2021-05-04 ENCOUNTER — Ambulatory Visit: Payer: Medicare Other | Admitting: Urology

## 2021-05-04 ENCOUNTER — Other Ambulatory Visit: Payer: Self-pay

## 2021-05-04 ENCOUNTER — Encounter: Payer: Self-pay | Admitting: Urology

## 2021-05-04 VITALS — BP 133/84 | HR 103 | Ht 72.0 in | Wt 258.0 lb

## 2021-05-04 DIAGNOSIS — N138 Other obstructive and reflux uropathy: Secondary | ICD-10-CM

## 2021-05-04 DIAGNOSIS — R3129 Other microscopic hematuria: Secondary | ICD-10-CM | POA: Diagnosis not present

## 2021-05-04 DIAGNOSIS — N401 Enlarged prostate with lower urinary tract symptoms: Secondary | ICD-10-CM

## 2021-05-04 DIAGNOSIS — R339 Retention of urine, unspecified: Secondary | ICD-10-CM | POA: Diagnosis not present

## 2021-05-04 NOTE — Patient Instructions (Signed)
Cystoscopy Cystoscopy is a procedure that is used to help diagnose and sometimes treat conditions that affect the lower urinary tract. The lower urinary tract includes the bladder and the urethra. The urethra is the tube that drains urine from the bladder. Cystoscopy is done using a thin, tube-shaped instrument with a light and camera at the end (cystoscope). The cystoscope may be hard or flexible, depending on the goal of the procedure. The cystoscope is inserted through the urethra, into the bladder. Cystoscopy may be recommended if you have: Urinary tract infections that keep coming back. Blood in the urine (hematuria). An inability to control when you urinate (urinary incontinence) or an overactive bladder. Unusual cells found in a urine sample. A blockage in the urethra, such as a urinary stone. Painful urination. An abnormality in the bladder found during an intravenous pyelogram (IVP) or CT scan. Cystoscopy may also be done to remove a sample of tissue to be examined under a microscope (biopsy). What are the risks? Generally, this is a safe procedure. However, problems may occur, including: Infection. Bleeding.  What happens during the procedure?  You will be given one or more of the following: A medicine to numb the area (local anesthetic). The area around the opening of your urethra will be cleaned. The cystoscope will be passed through your urethra into your bladder. Germ-free (sterile) fluid will flow through the cystoscope to fill your bladder. The fluid will stretch your bladder so that your health care provider can clearly examine your bladder walls. Your doctor will look at the urethra and bladder. The cystoscope will be removed The procedure may vary among health care providers  What can I expect after the procedure? After the procedure, it is common to have: Some soreness or pain in your abdomen and urethra. Urinary symptoms. These include: Mild pain or burning when you  urinate. Pain should stop within a few minutes after you urinate. This may last for up to 1 week. A small amount of blood in your urine for several days. Feeling like you need to urinate but producing only a small amount of urine. Follow these instructions at home: General instructions Return to your normal activities as told by your health care provider.  Do not drive for 24 hours if you were given a sedative during your procedure. Watch for any blood in your urine. If the amount of blood in your urine increases, call your health care provider. If a tissue sample was removed for testing (biopsy) during your procedure, it is up to you to get your test results. Ask your health care provider, or the department that is doing the test, when your results will be ready. Drink enough fluid to keep your urine pale yellow. Keep all follow-up visits as told by your health care provider. This is important. Contact a health care provider if you: Have pain that gets worse or does not get better with medicine, especially pain when you urinate. Have trouble urinating. Have more blood in your urine. Get help right away if you: Have blood clots in your urine. Have abdominal pain. Have a fever or chills. Are unable to urinate. Summary Cystoscopy is a procedure that is used to help diagnose and sometimes treat conditions that affect the lower urinary tract. Cystoscopy is done using a thin, tube-shaped instrument with a light and camera at the end. After the procedure, it is common to have some soreness or pain in your abdomen and urethra. Watch for any blood in your urine.   If the amount of blood in your urine increases, call your health care provider. If you were prescribed an antibiotic medicine, take it as told by your health care provider. Do not stop taking the antibiotic even if you start to feel better. This information is not intended to replace advice given to you by your health care provider. Make  sure you discuss any questions you have with your health care provider. Document Revised: 07/15/2018 Document Reviewed: 07/15/2018 Elsevier Patient Education  2020 Elsevier Inc.  

## 2021-05-05 LAB — URINALYSIS, COMPLETE
Bilirubin, UA: NEGATIVE
Glucose, UA: NEGATIVE
Ketones, UA: NEGATIVE
Leukocytes,UA: NEGATIVE
Nitrite, UA: NEGATIVE
Protein,UA: NEGATIVE
Specific Gravity, UA: 1.015 (ref 1.005–1.030)
Urobilinogen, Ur: 0.2 mg/dL (ref 0.2–1.0)
pH, UA: 6.5 (ref 5.0–7.5)

## 2021-05-05 LAB — MICROSCOPIC EXAMINATION: Bacteria, UA: NONE SEEN

## 2021-05-08 ENCOUNTER — Other Ambulatory Visit: Payer: Self-pay

## 2021-05-08 ENCOUNTER — Ambulatory Visit
Admission: RE | Admit: 2021-05-08 | Discharge: 2021-05-08 | Disposition: A | Discharge status: Home / Self Care | Payer: Medicare Other | Source: Ambulatory Visit | Attending: Urology | Admitting: Urology

## 2021-05-08 DIAGNOSIS — N3289 Other specified disorders of bladder: Secondary | ICD-10-CM | POA: Diagnosis not present

## 2021-05-08 DIAGNOSIS — K5730 Diverticulosis of large intestine without perforation or abscess without bleeding: Secondary | ICD-10-CM | POA: Diagnosis not present

## 2021-05-08 DIAGNOSIS — K7290 Hepatic failure, unspecified without coma: Secondary | ICD-10-CM | POA: Diagnosis not present

## 2021-05-08 DIAGNOSIS — R3129 Other microscopic hematuria: Secondary | ICD-10-CM | POA: Diagnosis not present

## 2021-05-08 LAB — POCT I-STAT CREATININE: Creatinine, Ser: 1.6 mg/dL — ABNORMAL HIGH (ref 0.61–1.24)

## 2021-05-08 LAB — BLADDER SCAN AMB NON-IMAGING: Scan Result: 5

## 2021-05-08 MED ORDER — IOHEXOL 350 MG/ML SOLN
100.0000 mL | Freq: Once | INTRAVENOUS | Status: AC | PRN
  Administered 2021-05-08: 100 mL via INTRAVENOUS

## 2021-05-10 ENCOUNTER — Ambulatory Visit: Payer: Self-pay

## 2021-05-10 ENCOUNTER — Ambulatory Visit: Payer: Medicare Other | Admitting: Orthopedic Surgery

## 2021-05-10 ENCOUNTER — Other Ambulatory Visit: Payer: Self-pay

## 2021-05-10 DIAGNOSIS — M79604 Pain in right leg: Secondary | ICD-10-CM

## 2021-05-10 DIAGNOSIS — M25562 Pain in left knee: Secondary | ICD-10-CM

## 2021-05-14 ENCOUNTER — Encounter: Payer: Self-pay | Admitting: Orthopedic Surgery

## 2021-05-14 NOTE — Progress Notes (Signed)
Office Visit Note   Patient: James [name]           Date of Birth: 1945-09-23           MRN: [id number] Visit Date: 05/10/2021 Requested by: Tonia Ghent, MD [place],  [place] [address] PCP: Tonia Ghent, MD  Subjective: Chief Complaint  Patient presents with   Right Hip - Pain   Left Knee - Pain    HPI: James [name] is a patient with right hip and left knee pain.  Reports pain that comes and goes with ambulation or activity.  Pain does not wake him from sleep at night.  Denies any locking or popping.  Does report groin pain as well as buttock pain.  No real radiation.  His right hip hurts him more than the left knee.  Denies any left hip symptoms.  His pain does go down the back of the leg but he denies any back pain.  He has 200 yards of walking endurance do more to the hip and the knee.  He has an immense it he would like to spend time with his daughter at the end of November.              ROS: All systems reviewed are negative as they relate to the chief complaint within the history of present illness.  Patient denies  fevers or chills.   Assessment & Plan: Visit Diagnoses:  1. Pain in right leg   2. Left knee pain, unspecified chronicity     Plan: Impression is right hip pain with arthritis and left knee pain with arthritis.  The knee seems more stable.  I think his hip is more symptomatic.  He is not really ready for any type of surgery yet.  He is on Eliquis as a blood thinner.  Best option for Tom is right hip injection under fluoroscopic guidance with Dr. Ernestina Patches second week in November with return office visit in early December.  May need to consider some type of surgical intervention after that depending on how the hip and knee is feeling.  Follow-Up Instructions: No follow-ups on file.   Orders:  Orders Placed This Encounter  Procedures   XR HIP UNILAT W OR W/O PELVIS 2-3 VIEWS RIGHT   XR Lumbar Spine 2-3 Views   XR Knee 1-2 Views Left    Ambulatory referral to Physical Medicine Rehab   No orders of the defined types were placed in this encounter.     Procedures: No procedures performed   Clinical Data: No additional findings.  Objective: Vital Signs: There were no vitals taken for this visit.  Physical Exam:   Constitutional: Patient appears well-developed HEENT:  Head: Normocephalic Eyes:EOM are normal Neck: Normal range of motion Cardiovascular: Normal rate Pulmonary/chest: Effort normal Neurologic: Patient is alert Skin: Skin is warm Psychiatric: Patient has normal mood and affect   Ortho Exam: Ortho exam demonstrates slight Trendelenburg gait to the right.  He does have groin pain and some restricted range of motion of the right hip.  5 out of 5 hip abduction and flexion strength with 5 out of 5 ankle dorsiflexion plantarflexion quad hamstring strength.  Left knee has mild crepitus with range of motion as well as some joint line tenderness.  Collateral cruciate ligaments are stable.  No other masses lymphadenopathy or skin changes noted in that left knee region.  Specialty Comments:  No specialty comments available.  Imaging: No results found.  PMFS History: Patient Active Problem List   Diagnosis Date Noted   Thoracic aortic aneurysm without rupture 11/25/2019   Chronic heart failure with preserved ejection fraction ([place]) 08/18/2018   Hyperlipidemia LDL goal <70 08/18/2018   Obstructive sleep apnea 05/23/2018   Coronary artery disease involving native coronary artery of native heart without angina pectoris 05/16/2018   Unstable angina (HCC)    CHF (congestive heart failure) ([place]) 05/09/2018   Benign prostatic hyperplasia with nocturia 05/07/2018   Dyspnea on exertion 02/19/2018   Persistent atrial fibrillation ([place]) 02/12/2018   Cardiac murmur 02/12/2018   Knee pain 08/07/2017   Weak urinary stream 05/01/2016   Healthcare maintenance 05/01/2016   Hematuria 03/06/2016   Hx of adenomatous  colonic polyps 12/05/2015   Advance care planning 07/21/2014   Medicare annual wellness visit, subsequent 07/01/2012   External hemorrhoids 06/27/2011   Hypothyroidism 02/24/2008   HYPERCHOLESTEROLEMIA 02/21/2007   Essential hypertension 02/21/2007   DEGENERATIVE JOINT DISEASE, CERVICAL SPINE 02/21/2007   Past Medical History:  Diagnosis Date   (HFpEF) heart failure with preserved ejection fraction ([place])    a. 05/2018 Echo: EF 55-60%, gr2 DD.   Acute lower GI bleeding after colonoscopy and polypectomy, resolved 12/12/2015   Ascending aortic aneurysm    a. 02/2018 Echo: mildly dil Ao root/asc ao/arch; b. 05/2018 CTA Chest: 4.7cm fusiform aneurysm of Asc Ao.   CAD (coronary artery disease)    a. 05/2018 Cath/PCI: LM nl,LAD 30p, 90/99d/apical, LCX 4m OM1/2/3 min irregs, RCA 85p (3.5x18 [place].   Cardiac murmur    a. 02/2018 Echo: EF 60-65%, no rwma, mild AI, mildly dil Ao root/Asc Ao/Arch, Mild MR, mildly dil LA. Nl RV fxn.   Cataract    bilateral surgery to remove   CHF (congestive heart failure) (HCC)    Colon polyp    Constipation    Essential hypertension    Hemorrhoids    Hepatitis    self resolved, likely food exposure, 1974.    History of cardiovascular stress test    a. 02/2018 Myoview: EF 55-65%, small/mild apical defect w/ nl wall motion ->attenuation artifact. No ischemia. Low risk study.   Hx of adenomatous colonic polyps 12/05/2015   Hyperlipidemia    Hypothyroidism    Mitral regurgitation    a. 110/2019 Echo: EF 55-60%. Gr2 DD. Triv AI. Ao root 375m Mild MR. Mod dil LA, mildly dil RA.   Osteoarthritis    Persistent atrial fibrillation ([place]   a. Dx 02/2018. CHA2DS2VASc = 2-->Eliquis; b. 03/2018 DCCV (200J); c. 03/2018 recurrent Afib-->flecainide started 04/2018;  d. 05/06/2018 s/p successful DCCV - 200J x 2-->on amio.   Skin cancer    basal cell, R ear, MOHS    Family History  Problem Relation Age of Onset   Heart disease Paternal Grandfather        MI, old age    Stroke Mother    Lung cancer Maternal Grandfather        smoker   Stroke Maternal Grandfather    Colon cancer Neg Hx    Colon polyps Neg Hx    Esophageal cancer Neg Hx    Rectal cancer Neg Hx    Stomach cancer Neg Hx    Bladder Cancer Neg Hx    Prostate cancer Neg Hx     Past Surgical History:  Procedure Laterality Date   BROW LIFT Bilateral 10/23/2016   Procedure: BLEPHAROPLASTY upper eyelid with excess skin;  Surgeon: AmKarle StarchMD;  [place] [facility]  Service: Ophthalmology;  Laterality: Bilateral;  MAC   CARDIOVERSION N/A 03/18/2018   Procedure: CARDIOVERSION;  Surgeon: Nelva Bush, MD;  Location: [facility];  Service: Cardiovascular;  Laterality: N/A;   CARDIOVERSION N/A 05/06/2018   Procedure: CARDIOVERSION;  Surgeon: Nelva Bush, MD;  Location: [facility];  Service: Cardiovascular;  Laterality: N/A;   CATARACT EXTRACTION  1986   OD   CATARACT EXTRACTION Bilateral 1995   x 2 for right and left    COLONOSCOPY     CORONARY STENT INTERVENTION N/A 05/13/2018   Procedure: CORONARY STENT INTERVENTION;  Surgeon: Nelva Bush, MD;  Location: [facility];  Service: Cardiovascular;  Laterality: N/A;   ECTROPION REPAIR Bilateral 10/23/2016   Procedure: REPAIR OF ECTROPION sutures, extensive;  Surgeon: Karle Starch, MD;  Location: [facility];  Service: Ophthalmology;  Laterality: Bilateral;   LIPOMA EXCISION  06/1997   left neck (Juengel)   MOHS SURGERY Right 2013   behind right ear   RIGHT/LEFT HEART CATH AND CORONARY ANGIOGRAPHY N/A 05/12/2018   Procedure: RIGHT/LEFT HEART CATH AND CORONARY ANGIOGRAPHY;  Surgeon: Wellington Hampshire, MD;  Location: [facility];  Service: Cardiovascular;  Laterality: N/A;   TONSILLECTOMY     VASECTOMY  1982   Social History   Occupational History   Occupation: Retired    Fish farm manager: RETIRED    Comment: 1  Tobacco Use   Smoking status: Former    Packs/day: 2.00    Years: 20.00    Pack years: 40.00     Types: Cigarettes    Quit date: 08/07/1983    Years since quitting: 37.7   Smokeless tobacco: Never   Tobacco comments:    quit over 30 years ago  Vaping Use   Vaping Use: Never used  Substance and Sexual Activity   Alcohol use: Yes    Alcohol/week: 1.0 standard drink    Types: 1 Shots of liquor per week    Comment: 1 at night before bed   Drug use: No   Sexual activity: Yes    Partners: Female

## 2021-05-18 ENCOUNTER — Ambulatory Visit: Payer: Medicare Other | Admitting: Urology

## 2021-05-18 ENCOUNTER — Other Ambulatory Visit: Payer: Self-pay

## 2021-05-18 VITALS — BP 136/80 | HR 83 | Ht 72.0 in | Wt 265.0 lb

## 2021-05-18 DIAGNOSIS — N138 Other obstructive and reflux uropathy: Secondary | ICD-10-CM

## 2021-05-18 DIAGNOSIS — N401 Enlarged prostate with lower urinary tract symptoms: Secondary | ICD-10-CM

## 2021-05-18 DIAGNOSIS — R3129 Other microscopic hematuria: Secondary | ICD-10-CM | POA: Diagnosis not present

## 2021-05-18 NOTE — Progress Notes (Signed)
Cystoscopy Procedure Note:  Indication: Microscopic hematuria, BPH  After informed consent and discussion of the procedure and its risks, James [name] was positioned and prepped in the standard fashion. Cystoscopy was performed with a flexible cystoscope. The urethra, bladder neck and entire bladder was visualized in a standard fashion. The prostate was small to moderate in size with obstructing lateral lobes, no median lobe. The ureteral orifices were visualized in their normal location and orientation.  Moderate bladder trabeculations and distended bladder, no suspicious lesions, no abnormalities on retroflexion  Imaging: CT urogram reviewed, prostate measured 32 g, no other urologic abnormalities  Findings: No suspicious lesions, moderate bladder trabeculations suspect BPH with incomplete emptying  -------------------------------------------------------------------------------  Assessment and Plan: 75 year old male with long history of BPH on maximal medical therapy with doxazosin and finasteride, prostate measures 32 g on recent CT for microscopic hematuria.  Cystoscopy today with moderate bladder trabeculations and a large distended bladder, and he complains of bothersome urinary symptoms of weak stream, feeling of incomplete emptying, and lower abdominal pressure.  I think he would benefit significantly from an outlet procedure, specifically UroLift with his small prostate, and no median lobe.  We discussed other benefits including ability to discontinue to medications, stronger stream, better emptying, and prevention of the bladder getting burned out from blockage over the long-term.  Risks would include bleeding, infection, possible need for temporary catheter placement, extremely low to 0 risk of incontinence, and no impact on erections or ejaculations.  Patient information provided today.  He is strongly interested in pursuing UroLift, he will call to schedule    Nickolas Madrid,  MD 05/18/2021

## 2021-05-23 ENCOUNTER — Telehealth: Payer: Self-pay

## 2021-05-23 ENCOUNTER — Other Ambulatory Visit: Payer: Self-pay | Admitting: Internal Medicine

## 2021-05-23 NOTE — Telephone Encounter (Signed)
Also rx needs to be sent to [facility]

## 2021-05-23 NOTE — Telephone Encounter (Signed)
Please contact pt for future appointment. Pt overdue for 6 month f/u.  

## 2021-05-23 NOTE — Telephone Encounter (Signed)
Refill request for levothyroxine 150 mcg tablet  LOV - 09/05/20 Next OV - not scheduled Last refill - 07/14/20 #100/3  Last lab was done 07/2020

## 2021-05-24 MED ORDER — LEVOTHYROXINE SODIUM 150 MCG PO TABS: 150.0000 ug | ORAL_TABLET | Freq: Every day | ORAL | 1 refills | Status: DC

## 2021-05-24 NOTE — Telephone Encounter (Signed)
Called patient and got him scheduled for 08/25/21 at 9

## 2021-05-24 NOTE — Addendum Note (Signed)
Addended by: Tonia Ghent on: 05/24/2021 10:56 AM   Modules accepted: Orders

## 2021-05-24 NOTE — Telephone Encounter (Addendum)
Sent. Thanks.  Please schedule yearly fu when possible in 2023.

## 2021-05-25 ENCOUNTER — Encounter: Payer: Self-pay | Admitting: Internal Medicine

## 2021-05-25 ENCOUNTER — Ambulatory Visit: Payer: Medicare Other | Admitting: Internal Medicine

## 2021-05-25 ENCOUNTER — Other Ambulatory Visit: Payer: Self-pay

## 2021-05-25 VITALS — BP 124/74 | HR 85 | Ht 72.0 in | Wt 268.0 lb

## 2021-05-25 DIAGNOSIS — I5032 Chronic diastolic (congestive) heart failure: Secondary | ICD-10-CM | POA: Diagnosis not present

## 2021-05-25 DIAGNOSIS — E785 Hyperlipidemia, unspecified: Secondary | ICD-10-CM | POA: Diagnosis not present

## 2021-05-25 DIAGNOSIS — I7121 Aneurysm of the ascending aorta, without rupture: Secondary | ICD-10-CM | POA: Diagnosis not present

## 2021-05-25 DIAGNOSIS — I10 Essential (primary) hypertension: Secondary | ICD-10-CM | POA: Diagnosis not present

## 2021-05-25 DIAGNOSIS — I2510 Atherosclerotic heart disease of native coronary artery without angina pectoris: Secondary | ICD-10-CM

## 2021-05-25 DIAGNOSIS — I4819 Other persistent atrial fibrillation: Secondary | ICD-10-CM

## 2021-05-25 DIAGNOSIS — Z79899 Other long term (current) drug therapy: Secondary | ICD-10-CM

## 2021-05-25 MED ORDER — AMIODARONE HCL 100 MG PO TABS
100.0000 mg | ORAL_TABLET | Freq: Every day | ORAL | Status: DC
Start: 2021-05-25 — End: 2021-06-07

## 2021-05-25 NOTE — Patient Instructions (Addendum)
Medication Instructions:  - Your physician recommends that you continue on your current medications as directed. Please refer to the Current Medication list given to you today.  *If you need a refill on your cardiac medications before your next appointment, please call your pharmacy*   Lab Work: - Your physician recommends that you have lab work today: CBC/ CMET/ TSH/ Lipid/ Direct LDL  If you have labs (blood work) drawn today and your tests are completely normal, you will receive your results only by: [place] (if you have MyChart) OR A paper copy in the mail If you have any lab test that is abnormal or we need to change your treatment, we will call you to review the results.   Testing/Procedures:  1) CT angiogram of the Chest: Ascending Aortic Aneursym - Non-Cardiac CT Angiography (CTA), is a special type of CT scan that uses a computer to produce multi-dimensional views of major blood vessels throughout the body. In CT angiography, a contrast material is injected through an IV to help visualize the blood vessels  You may call [phone] to schedule at your convenience   2) You have been referred to Dr. Lars Mage: consultation for atrial fibrillation   Follow-Up: At [facility], you and your health needs are our priority.  As part of our continuing mission to provide you with exceptional heart care, we have created designated Provider Care Teams.  These Care Teams include your primary Cardiologist (physician) and Advanced Practice Providers (APPs -  Physician Assistants and Nurse Practitioners) who all work together to provide you with the care you need, when you need it.  We recommend signing up for the patient portal called "MyChart".  Sign up information is provided on this After Visit Summary.  MyChart is used to connect with patients for Virtual Visits (Telemedicine).  Patients are able to view lab/test results, encounter notes, upcoming appointments, etc.   Non-urgent messages can be sent to your provider as well.   To learn more about what you can do with MyChart, go to [email or web address].    Your next appointment:   6 month(s)  The format for your next appointment:   In Person  Provider:   You may see Nelva Bush, MD or one of the following Advanced Practice Providers on your designated Care Team:   Murray Hodgkins, NP Christell Faith, PA-C Marrianne Mood, PA-C Cadence Kathlen Mody, [place]   Other Instructions N/a

## 2021-05-25 NOTE — Progress Notes (Signed)
Follow-up Outpatient Visit Date: 05/25/2021  Primary Care Provider: Tonia Ghent, MD [place] [address]   Chief Complaint: Follow-up coronary artery disease, atrial fibrillation, and thoracic aortic aneurysm  HPI:  [name] is a 75 y.o. male with history of coronary artery disease status post PCI to the RCA (05/2018), thoracic aortic aneurysm, HFpEF, persistent atrial fibrillation, hypertension, hyperlipidemia, hypothyroidism, and obesity, who presents for follow-up of coronary artery disease and thoracic aortic aneurysm.  I last saw him in January, at which time he reported stable exertional dyspnea and bilateral knee pain.  We performed a myocardial perfusion stress test to exclude worsening ischemic heart disease leading to exertional dyspnea; the study was low risk with small fixed apical defect felt to be artifact or possible infarct without evidence of ischemia.  Today, Mr. Lomax reports that he is feeling fairly well.  He is trying to walk more and has noted that his exertional dyspnea is a little bit better than at our last visit.  He denies chest pain, palpitations, and lightheadedness.  He has mild chronic dependent edema, which is unchanged.  He continues to have hip and knee problems and is scheduled for joint injections through orthopedics.  --------------------------------------------------------------------------------------------------  Past Medical History:  Diagnosis Date   (HFpEF) heart failure with preserved ejection fraction ([place])    a. 05/2018 Echo: EF 55-60%, gr2 DD.   Acute lower GI bleeding after colonoscopy and polypectomy, resolved 12/12/2015   Ascending aortic aneurysm    a. 02/2018 Echo: mildly dil Ao root/asc ao/arch; b. 05/2018 CTA Chest: 4.7cm fusiform aneurysm of Asc Ao.   CAD (coronary artery disease)    a. 05/2018 Cath/PCI: LM nl,LAD 30p, 90/99d/apical, LCX 58m OM1/2/3 min irregs, RCA 85p (3.5x18 [place].   Cardiac murmur     a. 02/2018 Echo: EF 60-65%, no rwma, mild AI, mildly dil Ao root/Asc Ao/Arch, Mild MR, mildly dil LA. Nl RV fxn.   Cataract    bilateral surgery to remove   CHF (congestive heart failure) (HCC)    Colon polyp    Constipation    Essential hypertension    Hemorrhoids    Hepatitis    self resolved, likely food exposure, 1974.    History of cardiovascular stress test    a. 02/2018 Myoview: EF 55-65%, small/mild apical defect w/ nl wall motion ->attenuation artifact. No ischemia. Low risk study.   Hx of adenomatous colonic polyps 12/05/2015   Hyperlipidemia    Hypothyroidism    Mitral regurgitation    a. 110/2019 Echo: EF 55-60%. Gr2 DD. Triv AI. Ao root 342m Mild MR. Mod dil LA, mildly dil RA.   Osteoarthritis    [place] fibrillation [place]   a. Dx 02/2018. CHA2DS2VASc = 2-->Eliquis; b. 03/2018 DCCV (200J); c. 03/2018 recurrent Afib-->flecainide started 04/2018;  d. 05/06/2018 s/p successful DCCV - 200J x 2-->on amio.   Skin cancer    basal cell, R ear, MOHS   Past Surgical History:  Procedure Laterality Date   BROW LIFT Bilateral 10/23/2016   Procedure: BLEPHAROPLASTY upper eyelid with excess skin;  Surgeon: AmKarle StarchMD;  [facility] Service: Ophthalmology;  Laterality: Bilateral;  MAC   CARDIOVERSION N/A 03/18/2018   Procedure: CARDIOVERSION;  Surgeon: EnNelva BushMD;  [facility];  Service: Cardiovascular;  Laterality: N/A;   CARDIOVERSION N/A 05/06/2018   Procedure: CARDIOVERSION;  Surgeon: EnNelva BushMD;  Location: [facility];  Service: Cardiovascular;  Laterality: N/A;  CATARACT EXTRACTION  1986   OD   CATARACT EXTRACTION Bilateral 1995   x 2 for right and left    COLONOSCOPY     CORONARY STENT INTERVENTION N/A 05/13/2018   Procedure: CORONARY STENT INTERVENTION;  Surgeon: Nelva Bush, MD;  Location: [facility];  Service: Cardiovascular;  Laterality: N/A;   ECTROPION REPAIR Bilateral 10/23/2016   Procedure: REPAIR OF  ECTROPION sutures, extensive;  Surgeon: Karle Starch, MD;  Location: [place];  Service: Ophthalmology;  Laterality: Bilateral;   LIPOMA EXCISION  06/1997   left neck ([place])   MOHS SURGERY Right 2013   behind right ear   RIGHT/LEFT HEART CATH AND CORONARY ANGIOGRAPHY N/A 05/12/2018   Procedure: RIGHT/LEFT HEART CATH AND CORONARY ANGIOGRAPHY;  Surgeon: Wellington Hampshire, MD;  Location: [facility];  Service: Cardiovascular;  Laterality: N/A;   TONSILLECTOMY     VASECTOMY  1982    Current Meds  Medication Sig   Acetaminophen (TYLENOL 8 HOUR PO) Take 200 mg by mouth. Taking PRN   amiodarone (PACERONE) 200 MG tablet Take 100 mg by mouth daily.   apixaban (ELIQUIS) 5 MG TABS tablet TAKE 1 TABLET(5 MG) BY MOUTH TWICE DAILY   atorvastatin (LIPITOR) 80 MG tablet Take 1 tablet (80 mg total) by mouth daily at 6 PM.   DHA-EPA-Coenzyme Q10-Vitamin E (CO Q-10 VITAMIN E FISH OIL PO) Take 1 capsule by mouth daily.   diclofenac Sodium (VOLTAREN) 1 % GEL Apply 2 g topically 4 (four) times daily as needed.   doxazosin (CARDURA) 4 MG tablet Take 1 tablet (4 mg total) by mouth daily after breakfast.   ezetimibe (ZETIA) 10 MG tablet TAKE 1 TABLET BY MOUTH DAILY. NEED APPOINTMENT FOR FURTHER REFILLS. GENERIC EQUIVALENT FOR ZETIA   finasteride (PROSCAR) 5 MG tablet TAKE 1 TABLET BY MOUTH EVERY DAY   furosemide (LASIX) 20 MG tablet Take 1 tablet (20 mg total) by mouth every other day   levothyroxine (SYNTHROID) 150 MCG tablet Take 1 tablet (150 mcg total) by mouth daily before breakfast. Except take 1/2 tablet on Sundays.  Total of 6.5 tablets in 1 week.   Multiple Vitamin (MULTIVITAMIN WITH MINERALS) TABS tablet Take 1 tablet by mouth daily. Senior Multivitamin   polyethylene glycol (MIRALAX) 17 g packet Take 17 g by mouth 2 (two) times daily.   ramipril (ALTACE) 10 MG capsule TAKE 1 CAPSULE BY MOUTH DAILY GENERIC EQUIVALENT FOR ALTACE   Vitamins/Minerals TABS Take 1 tablet by mouth daily.     Allergies: Sulfonamide derivatives  Social History   Tobacco Use   Smoking status: Former    Packs/day: 2.00    Years: 20.00    Pack years: 40.00    Types: Cigarettes    Quit date: 08/07/1983    Years since quitting: 37.8   Smokeless tobacco: Never   Tobacco comments:    quit over 40 years ago  Vaping Use   Vaping Use: Never used  Substance Use Topics   Alcohol use: Yes    Alcohol/week: 1.0 standard drink    Types: 1 Shots of liquor per week    Comment: 1 at night before bed   Drug use: No    Family History  Problem Relation Age of Onset   Stroke Mother    Lung cancer Maternal Grandfather        smoker   Stroke Maternal Grandfather    Heart disease Paternal Grandfather        MI, old age  Colon cancer Neg Hx    Colon polyps Neg Hx    Esophageal cancer Neg Hx    Rectal cancer Neg Hx    Stomach cancer Neg Hx    Bladder Cancer Neg Hx    Prostate cancer Neg Hx     Review of Systems: A 12-system review of systems was performed and was negative except as noted in the HPI.  --------------------------------------------------------------------------------------------------  Physical Exam: BP 124/74 (BP Location: Left Arm, Patient Position: Sitting, Cuff Size: Large)   Pulse 85   Ht 6' (1.829 m)   Wt 268 lb (121.6 kg)   SpO2 99%   BMI 36.35 kg/m   General:  NAD. Neck: No JVD or HJR. Lungs: Clear to auscultation bilaterally without wheezes or crackles. Heart: Regular rate and rhythm without murmurs, rubs, or gallops. Abdomen: Soft, nontender, nondistended. Extremities: No lower extremity edema.  EKG: Normal sinus rhythm with LAFB and inferior infarct.  Compared with prior tracing from 08/31/2020, PVCs are no longer present.  Lab Results  Component Value Date   WBC 8.2 08/31/2020   HGB 15.1 08/31/2020   HCT 44.9 08/31/2020   MCV 93 08/31/2020   PLT 137 (L) 08/31/2020    Lab Results  Component Value Date   NA 145 (H) 08/31/2020   K 4.4 08/31/2020    CL 104 08/31/2020   CO2 22 08/31/2020   BUN 16 08/31/2020   CREATININE 1.60 (H) 05/08/2021   GLUCOSE 126 (H) 08/31/2020   ALT 13 08/31/2020    Lab Results  Component Value Date   CHOL 125 04/28/2020   HDL 49 04/28/2020   LDLCALC 64 04/28/2020   TRIG 62 04/28/2020   CHOLHDL 2.6 04/28/2020    --------------------------------------------------------------------------------------------------  ASSESSMENT AND PLAN: Coronary artery disease: No angina reported.  Exertional dyspnea actually little bit better than at prior visits with increased activity.  Myocardial perfusion stress test earlier this year was low risk without evidence of ischemia.  Favor artifact for small apical defect and less likely scar.  Continue current medications for secondary prevention.  Chronic HFpEF: Ms. Hemenway reports stable NYHA class II symptoms with mild dependent edema on exam.  We will continue his current regimen of furosemide 20 mg every other day.  Persistent atrial fibrillation: No symptoms to suggest recurrence.  EKG today again shows sinus rhythm.  Mr. Bunte is tolerating apixaban and low-dose amiodarone well.  We have discussed potential risks with long-term amiodarone use and have agreed to refer Mr. Bail to electrophysiology for consideration of alternative therapies, given that he had early recurrence of atrial fibrillation in 2019 following cardioversion without antiarrhythmic drugs.  We will check a CBC, CMP, and TSH today for ongoing monitoring of long-term amiodarone use.  Thoracic aortic aneurysm: No symptoms reported.  We will obtain CTA of the chest at Mr. Kuzniar convenience to reassess TAA, given that it has been a year since his last evaluation.  Continue lipid and blood pressure control.  Hyperlipidemia: Lipids adequately controlled on last check a year ago.  Continue atorvastatin and ezetimibe for target LDL less than 70.  Check lipid panel and CMP today.  Hypertension: Blood pressure  well controlled today.  No medication changes at this time.  Follow-up: Return to clinic in 6 months.  Nelva Bush, MD 05/25/2021 8:14 AM

## 2021-05-26 LAB — CBC
Hematocrit: 44.8 % (ref 37.5–51.0)
Hemoglobin: 15.2 g/dL (ref 13.0–17.7)
MCH: 31.6 pg (ref 26.6–33.0)
MCHC: 33.9 g/dL (ref 31.5–35.7)
MCV: 93 fL (ref 79–97)
Platelets: 142 10*3/uL — ABNORMAL LOW (ref 150–450)
RBC: 4.81 x10E6/uL (ref 4.14–5.80)
RDW: 12.3 % (ref 11.6–15.4)
WBC: 7.2 10*3/uL (ref 3.4–10.8)

## 2021-05-26 LAB — COMPREHENSIVE METABOLIC PANEL
ALT: 12 IU/L (ref 0–44)
AST: 17 IU/L (ref 0–40)
Albumin/Globulin Ratio: 2.1 (ref 1.2–2.2)
Albumin: 4 g/dL (ref 3.7–4.7)
Alkaline Phosphatase: 71 IU/L (ref 44–121)
BUN/Creatinine Ratio: 13 (ref 10–24)
BUN: 18 mg/dL (ref 8–27)
Bilirubin Total: 1 mg/dL (ref 0.0–1.2)
CO2: 22 mmol/L (ref 20–29)
Calcium: 9.3 mg/dL (ref 8.6–10.2)
Chloride: 107 mmol/L — ABNORMAL HIGH (ref 96–106)
Creatinine, Ser: 1.38 mg/dL — ABNORMAL HIGH (ref 0.76–1.27)
Globulin, Total: 1.9 g/dL (ref 1.5–4.5)
Glucose: 122 mg/dL — ABNORMAL HIGH (ref 70–99)
Potassium: 4.4 mmol/L (ref 3.5–5.2)
Sodium: 142 mmol/L (ref 134–144)
Total Protein: 5.9 g/dL — ABNORMAL LOW (ref 6.0–8.5)
eGFR: 54 mL/min/{1.73_m2} — ABNORMAL LOW (ref >59)

## 2021-05-26 LAB — TSH: TSH: 1.2 u[IU]/mL (ref 0.450–4.500)

## 2021-05-26 LAB — LIPID PANEL
Chol/HDL Ratio: 2.6 ratio (ref 0.0–5.0)
Cholesterol, Total: 126 mg/dL (ref 100–199)
HDL: 48 mg/dL (ref >39)
LDL Chol Calc (NIH): 63 mg/dL (ref 0–99)
Triglycerides: 76 mg/dL (ref 0–149)
VLDL Cholesterol Cal: 15 mg/dL (ref 5–40)

## 2021-05-26 LAB — LDL CHOLESTEROL, DIRECT: LDL Direct: 63 mg/dL (ref 0–99)

## 2021-06-02 ENCOUNTER — Other Ambulatory Visit: Payer: Self-pay

## 2021-06-02 ENCOUNTER — Ambulatory Visit
Admission: RE | Admit: 2021-06-02 | Discharge: 2021-06-02 | Disposition: A | Discharge status: Home / Self Care | Payer: Medicare Other | Source: Ambulatory Visit | Attending: Internal Medicine | Admitting: Internal Medicine

## 2021-06-02 DIAGNOSIS — I7121 Aneurysm of the ascending aorta, without rupture: Secondary | ICD-10-CM | POA: Insufficient documentation

## 2021-06-02 DIAGNOSIS — K449 Diaphragmatic hernia without obstruction or gangrene: Secondary | ICD-10-CM | POA: Diagnosis not present

## 2021-06-02 IMAGING — CT CT ANGIO CHEST
3 of 6 series · 18 of 46 positions shown · IV contrast (APPLIED)
Comparison: [DATE] and previous

CLINICAL DATA: Ascending aortic aneurysm, follow-up

EXAM:
CT ANGIOGRAPHY CHEST WITH CONTRAST
TECHNIQUE: Multidetector CT imaging of the chest was performed using the
standard protocol during bolus administration of intravenous
contrast. Multiplanar CT image reconstructions and MIPs were
obtained to evaluate the vascular anatomy.
CONTRAST:  100mL OMNIPAQUE IOHEXOL 350 MG/ML SOLN

[Series 4: axial arterial · axial · arterial · 0.76mm/px · z∈[-238,+53]mm · 11 of 117 slices shown]
[im 10/117  lung]
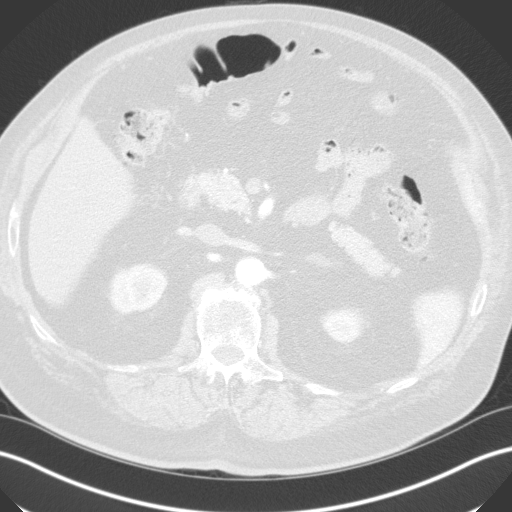
[im 20/117  soft-tissue]
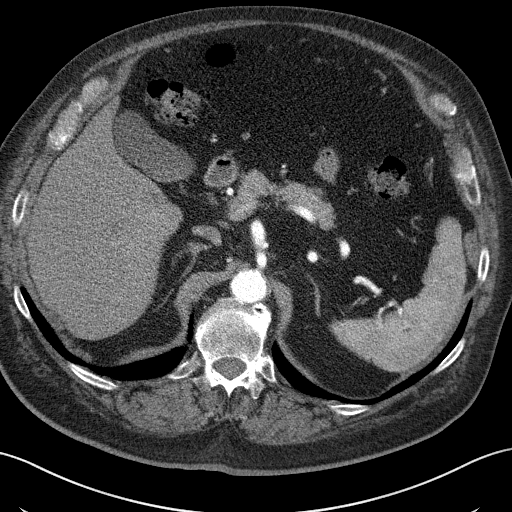
[im 30/117  lung]
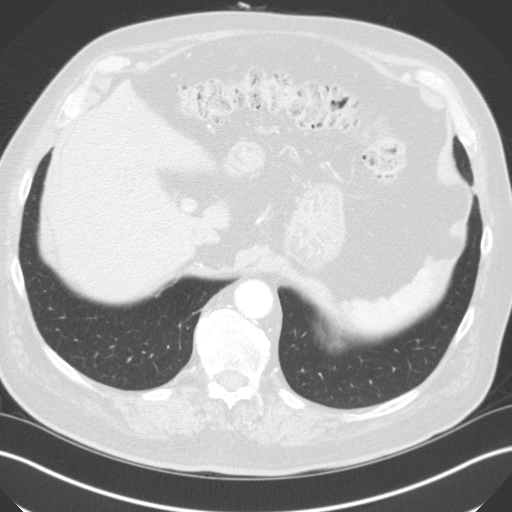
[im 39/117  soft-tissue]
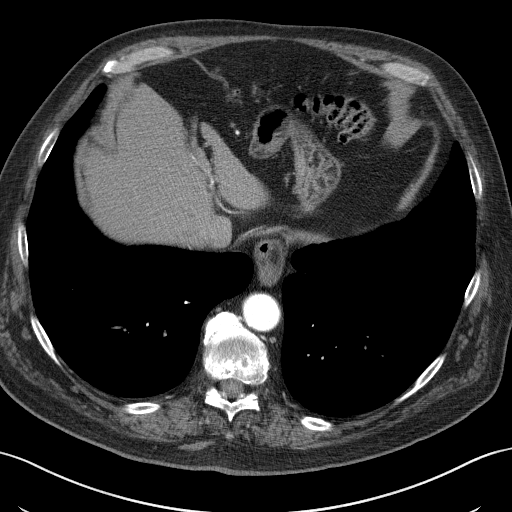
[im 49/117  lung]
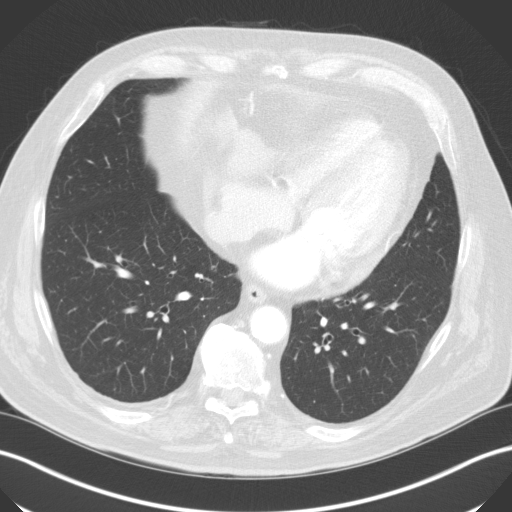
[im 59/117  soft-tissue]
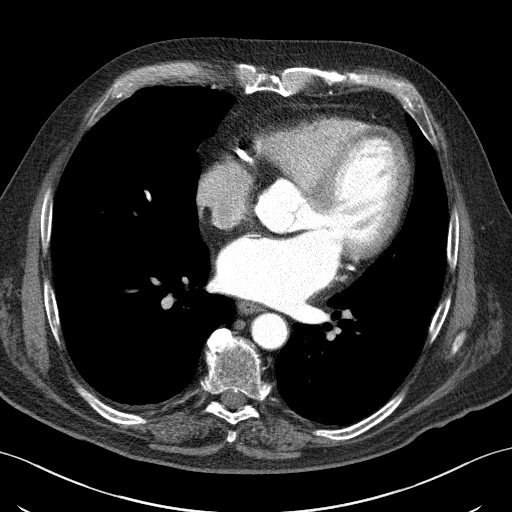
[im 68/117  lung]
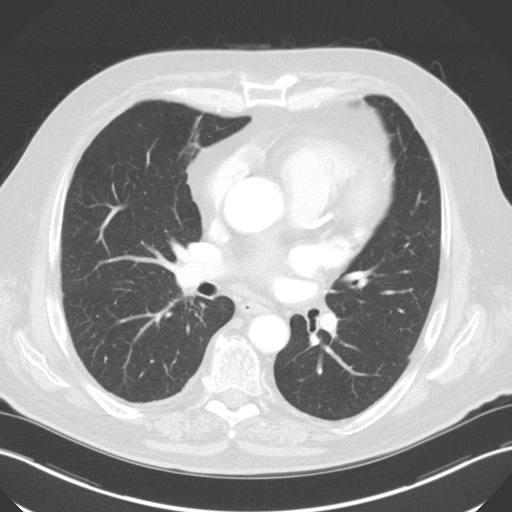
[im 78/117  soft-tissue]
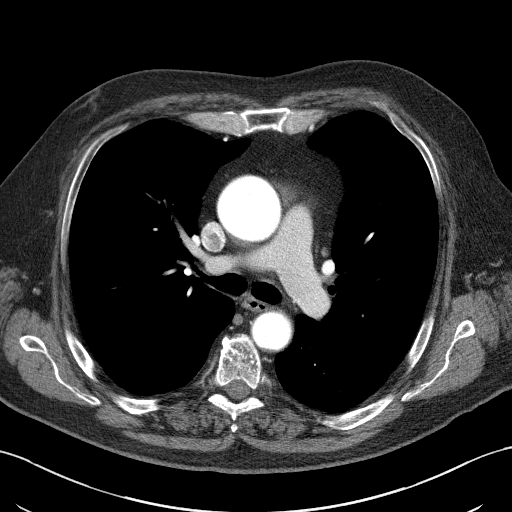
[im 88/117  lung]
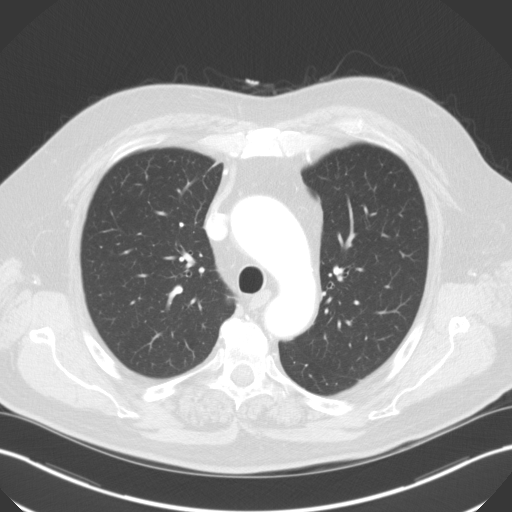
[im 97/117  soft-tissue]
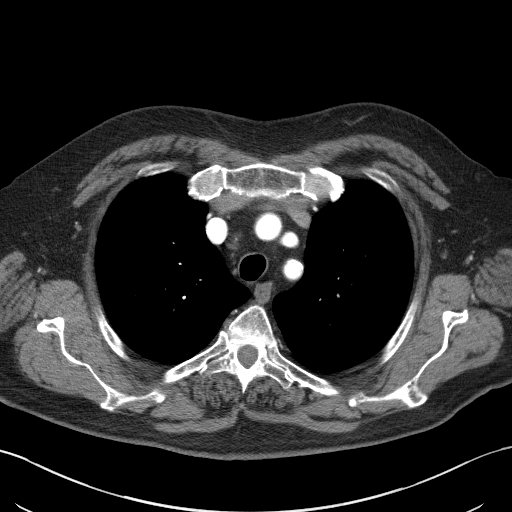
[im 107/117  lung]
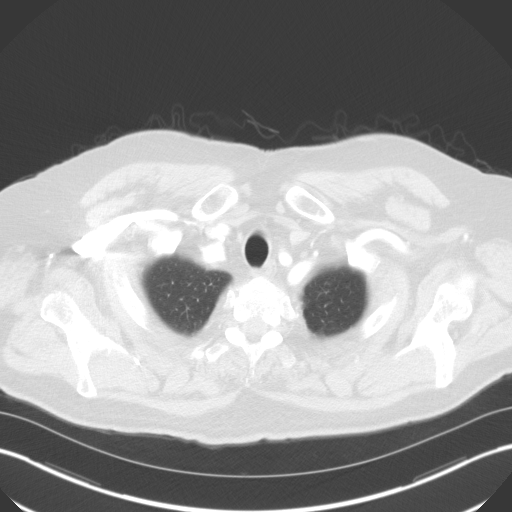

[Series 6: lung · axial · 0.76mm/px · z∈[-203,-95]mm · 4 of 161 slices shown]
[im 18/161  soft-tissue]
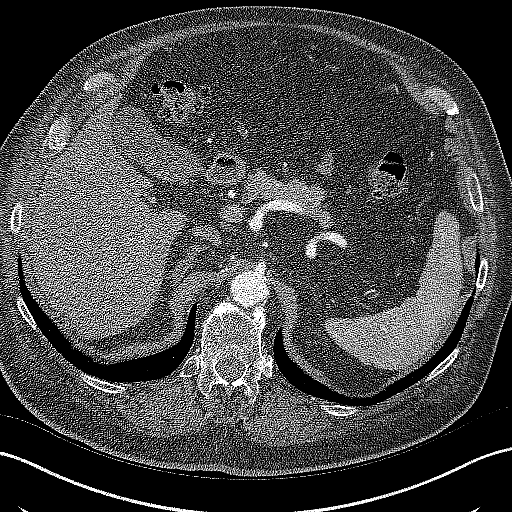
[im 36/161  soft-tissue]
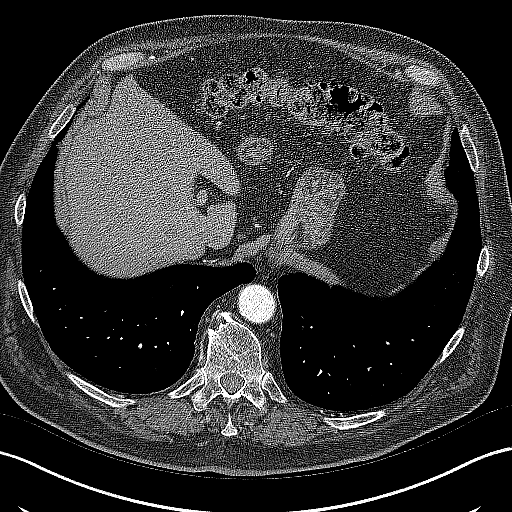
[im 54/161  soft-tissue]
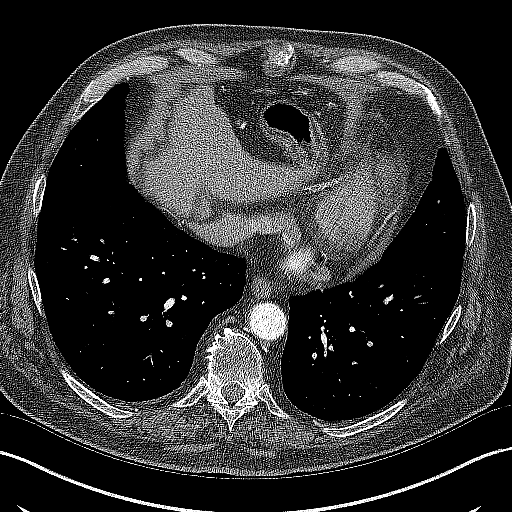
[im 72/161  soft-tissue]
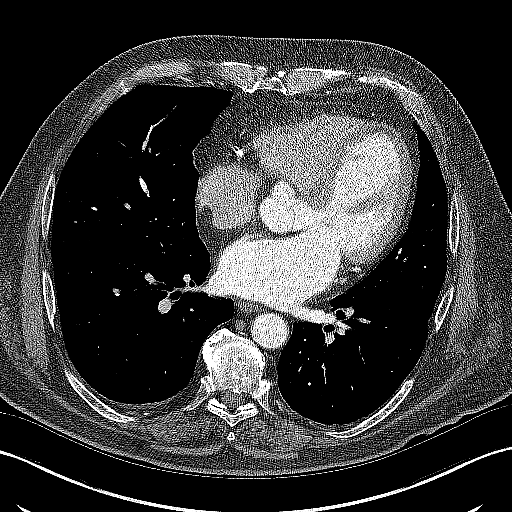

[Series 7: coronal · coronal · 0.73mm/px · 3 of 122 slices shown]
[im 31/122  soft-tissue]
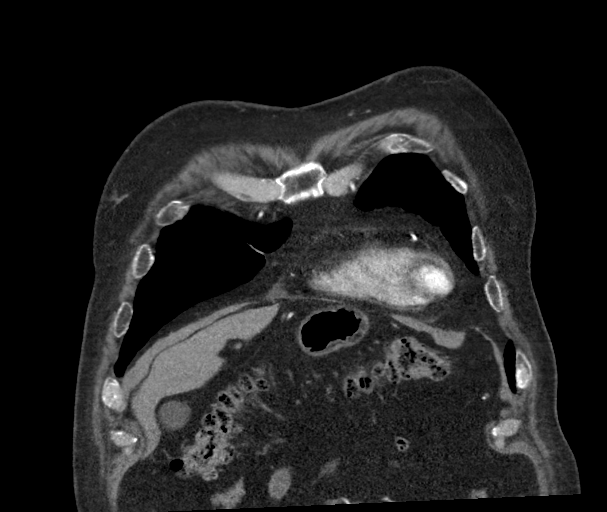
[im 61/122  soft-tissue]
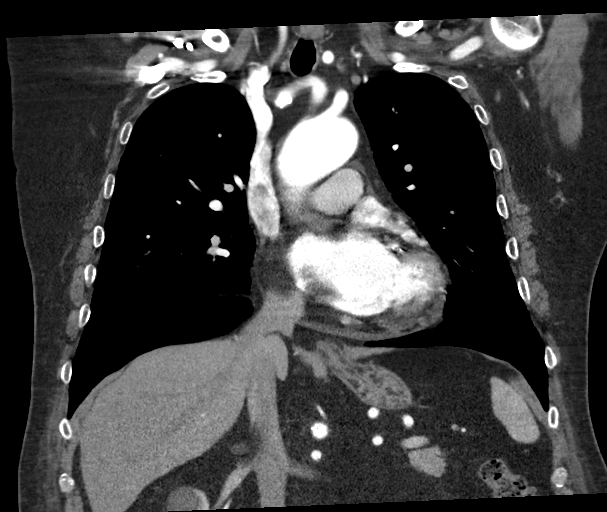
[im 91/122  soft-tissue]
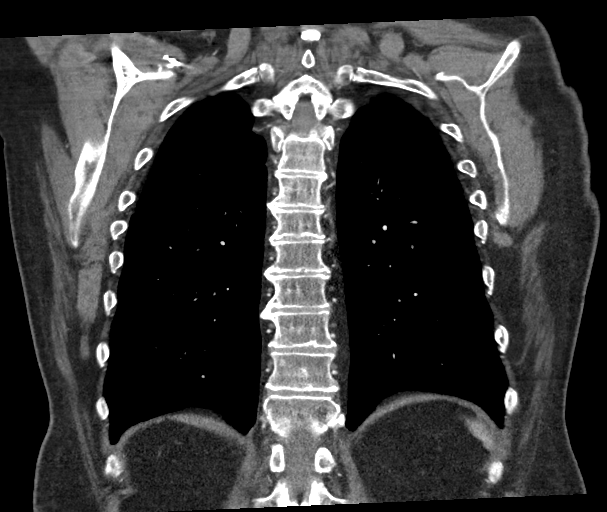

[18 of 46 positions shown; findings below may reference images not displayed]

FINDINGS: Cardiovascular: Mild four-chamber cardiac enlargement. Trace
pericardial fluid. Fair contrast opacification of the pulmonary
arterial tree; the exam was not optimized for detection of pulmonary
emboli. 3 vessel coronary calcifications. Good contrast
opacification of the thoracic aorta with no dissection or stenosis.
Classic 3 vessel brachiocephalic arterial origin anatomy, with
calcified ostial plaque but no high-grade stenosis. Scattered
calcified plaque in the distal arch and descending thoracic segment
as well as the visualized proximal abdominal aorta which is
nondilated.

Aortic Root:

--Valve: 2.7 cm

--Sinuses: 4.1 cm

--Sinotubular Junction: 3.2 cm

Limitations by motion: Mild

Thoracic Aorta:

--Ascending Aorta: 4.7 cm (previously 4.6)

--Aortic Arch: 4.3 cm

--Descending Aorta: 3.2 cm

Mediastinum/Nodes: No mediastinal hematoma. No mass or adenopathy.
Small hiatal hernia.

Lungs/Pleura: No pleural effusion.  Lungs are clear.

Upper Abdomen: Stable low-attenuation renal lesions presumably
cysts. No acute findings.

Musculoskeletal: Anterior vertebral endplate spurring at multiple
levels in the mid and lower thoracic spine. No fracture or worrisome
bone lesion.

Review of the MIP images confirms the above findings.
IMPRESSION: 1. 4.7 cm ascending aorta and 4.3 cm arch aneurysm, without
complicating features. Recommend semi-annual imaging followup by CTA
or MRA and referral to cardiothoracic surgery if not already
obtained. This recommendation follows [FS]
ACCF/AHA/AATS/ACR/ASA/SCA/TALLANA/TALLANA/TALLANA/TALLANA Guidelines for the
Diagnosis and Management of Patients With Thoracic Aortic Disease.
Circulation. [FS]; 121: [id number]
2. Coronary calcifications. The severity of coronary artery disease
and any potential stenosis cannot be assessed on this non-gated CT
examination. Assessment for potential risk factor modification,
dietary therapy or pharmacologic therapy may be warranted, if
clinically indicated. And Aortic Atherosclerosis ([FS]-170.0).

## 2021-06-02 MED ORDER — IOHEXOL 350 MG/ML SOLN
100.0000 mL | Freq: Once | INTRAVENOUS | Status: AC | PRN
  Administered 2021-06-02: 100 mL via INTRAVENOUS

## 2021-06-06 NOTE — Progress Notes (Signed)
Electrophysiology Office Note:    Date:  06/07/2021   ID:  James [name], DOB 05-21-1946, MRN [id number]  PCP:  James Ghent, MD  [facility] Cardiologist:  James Bush, MD  [facility] Electrophysiologist:  James Epley, MD   Referring MD: James Bush, MD   Chief Complaint: AF  History of Present Illness:    James [name] is a 75 y.o. male who presents for an evaluation of AF at the request of Dr End. Their medical history includes CAD, AF and thoracic aortic aneurysm. The patient last saw Dr End 05/25/2021. He is maintained on amiodarone and eliquis for his AF.  He has done well on amiodarone without recurrence of symptomatic atrial fibrillation.  He is interested in avoiding the long-term off target effects of amiodarone.    Past Medical History:  Diagnosis Date   (HFpEF) heart failure with preserved ejection fraction ([place])    a. 05/2018 Echo: EF 55-60%, gr2 DD.   Acute lower GI bleeding after colonoscopy and polypectomy, resolved 12/12/2015   Ascending aortic aneurysm    a. 02/2018 Echo: mildly dil Ao root/asc ao/arch; b. 05/2018 CTA Chest: 4.7cm fusiform aneurysm of Asc Ao.   CAD (coronary artery disease)    a. 05/2018 Cath/PCI: LM nl,LAD 30p, 90/99d/apical, LCX 11m OM1/2/3 min irregs, RCA 85p (3.5x18 [place].   Cardiac murmur    a. 02/2018 Echo: EF 60-65%, no rwma, mild AI, mildly dil Ao root/Asc Ao/Arch, Mild MR, mildly dil LA. Nl RV fxn.   Cataract    bilateral surgery to remove   CHF (congestive heart failure) (HCC)    Colon polyp    Constipation    Essential hypertension    Hemorrhoids    Hepatitis    self resolved, likely food exposure, 1974.    History of cardiovascular stress test    a. 02/2018 Myoview: EF 55-65%, small/mild apical defect w/ nl wall motion ->attenuation artifact. No ischemia. Low risk study.   Hx of adenomatous colonic polyps 12/05/2015   Hyperlipidemia    Hypothyroidism    Mitral regurgitation    a. 110/2019 Echo: EF  55-60%. Gr2 DD. Triv AI. Ao root 343m Mild MR. Mod dil LA, mildly dil RA.   Osteoarthritis    Persistent atrial fibrillation ([place]   a. Dx 02/2018. CHA2DS2VASc = 2-->Eliquis; b. 03/2018 DCCV (200J); c. 03/2018 recurrent Afib-->flecainide started 04/2018;  d. 05/06/2018 s/p successful DCCV - 200J x 2-->on amio.   Skin cancer    basal cell, R ear, MOHS    Past Surgical History:  Procedure Laterality Date   BROW LIFT Bilateral 10/23/2016   Procedure: BLEPHAROPLASTY upper eyelid with excess skin;  Surgeon: AmKarle StarchMD;  [place] Service: Ophthalmology;  Laterality: Bilateral;  MAC   CARDIOVERSION N/A 03/18/2018   Procedure: CARDIOVERSION;  Surgeon: EnNelva BushMD;  [place] [facility];  Service: Cardiovascular;  Laterality: N/A;   CARDIOVERSION N/A 05/06/2018   Procedure: CARDIOVERSION;  Surgeon: EnNelva BushMD;  Location: [facility];  Service: Cardiovascular;  Laterality: N/A;   CATARACT EXTRACTION  1986   OD   CATARACT EXTRACTION Bilateral 1995   x 2 for right and left    COLONOSCOPY     CORONARY STENT INTERVENTION N/A 05/13/2018   Procedure: CORONARY STENT INTERVENTION;  Surgeon: EnNelva BushMD;  [place] [facility];  Service: Cardiovascular;  Laterality: N/A;   ECTROPION REPAIR Bilateral 10/23/2016   Procedure: REPAIR OF ECTROPION sutures, extensive;  Surgeon: Karle Starch, MD;  Location: [place];  Service: Ophthalmology;  Laterality: Bilateral;   LIPOMA EXCISION  06/1997   left neck ([place])   MOHS SURGERY Right 2013   behind right ear   RIGHT/LEFT HEART CATH AND CORONARY ANGIOGRAPHY N/A 05/12/2018   Procedure: RIGHT/LEFT HEART CATH AND CORONARY ANGIOGRAPHY;  Surgeon: Wellington Hampshire, MD;  Location: [place] [facility];  Service: Cardiovascular;  Laterality: N/A;   TONSILLECTOMY     VASECTOMY  1982    Current Medications: Current Meds  Medication Sig   Acetaminophen (TYLENOL 8 HOUR PO) Take 200 mg by mouth. Taking PRN    apixaban (ELIQUIS) 5 MG TABS tablet TAKE 1 TABLET(5 MG) BY MOUTH TWICE DAILY   atorvastatin (LIPITOR) 80 MG tablet Take 1 tablet (80 mg total) by mouth daily at 6 PM.   DHA-EPA-Coenzyme Q10-Vitamin E (CO Q-10 VITAMIN E FISH OIL PO) Take 1 capsule by mouth daily.   diclofenac Sodium (VOLTAREN) 1 % GEL Apply 2 g topically 4 (four) times daily as needed.   doxazosin (CARDURA) 4 MG tablet Take 1 tablet (4 mg total) by mouth daily after breakfast.   ezetimibe (ZETIA) 10 MG tablet TAKE 1 TABLET BY MOUTH DAILY.   finasteride (PROSCAR) 5 MG tablet TAKE 1 TABLET BY MOUTH EVERY DAY   furosemide (LASIX) 20 MG tablet Take 1 tablet (20 mg total) by mouth every other day   levothyroxine (SYNTHROID) 150 MCG tablet Take 1 tablet (150 mcg total) by mouth daily before breakfast. Except take 1/2 tablet on Sundays.  Total of 6.5 tablets in 1 week.   Multiple Vitamin (MULTIVITAMIN WITH MINERALS) TABS tablet Take 1 tablet by mouth daily. Senior Multivitamin   polyethylene glycol (MIRALAX) 17 g packet Take 17 g by mouth 2 (two) times daily.   ramipril (ALTACE) 10 MG capsule TAKE 1 CAPSULE BY MOUTH DAILY GENERIC EQUIVALENT FOR ALTACE   Vitamins/Minerals TABS Take 1 tablet by mouth daily.   [DISCONTINUED] amiodarone (PACERONE) 100 MG tablet Take 1 tablet (100 mg total) by mouth daily.     Allergies:   Sulfonamide derivatives   Social History   Socioeconomic History   Marital status: Married    Spouse name: James [name]   Number of children: 1   Years of education: 14   Highest education level: Associate degree: occupational, Hotel manager, or vocational program  Occupational History   Occupation: Retired    Fish farm manager: RETIRED    Comment: 1  Tobacco Use   Smoking status: Former    Packs/day: 2.00    Years: 20.00    Pack years: 40.00    Types: Cigarettes    Quit date: 08/07/1983    Years since quitting: 37.8   Smokeless tobacco: Never   Tobacco comments:    quit over 40 years ago  Vaping Use   Vaping Use: Never  used  Substance and Sexual Activity   Alcohol use: Yes    Alcohol/week: 1.0 standard drink    Types: 1 Shots of liquor per week    Comment: 1 at night before bed   Drug use: No   Sexual activity: Yes    Partners: Female  Other Topics Concern   Not on file  Social History Narrative   Married and lives with wife 1974   One daughter, not local   Retired from [employer], sea based telecommunication/contracting   Social Determinants of Health   Financial Resource Strain: Low Risk    Difficulty of Paying Living Expenses: Not  hard at all  Food Insecurity: No Food Insecurity   Worried About Charity fundraiser in the Last Year: Never true   Ran Out of Food in the Last Year: Never true  Transportation Needs: No Transportation Needs   Lack of Transportation (Medical): No   Lack of Transportation (Non-Medical): No  Physical Activity: Inactive   Days of Exercise per Week: 0 days   Minutes of Exercise per Session: 0 min  Stress: No Stress Concern Present   Feeling of Stress : Not at all  Social Connections: Not on file     Family History: The patient's family history includes Heart disease in his paternal grandfather; Lung cancer in his maternal grandfather; Stroke in his maternal grandfather and mother. There is no history of Colon cancer, Colon polyps, Esophageal cancer, Rectal cancer, Stomach cancer, Bladder Cancer, or Prostate cancer.  ROS:   Please see the history of present illness.    All other systems reviewed and are negative.  EKGs/Labs/Other Studies Reviewed:    The following studies were reviewed today:  06/2019 Echo EF55% RV normal LA mild-mod dilated RA normal Mild to moderate MR Dilated aorta   Recent Labs: 05/25/2021: ALT 12; BUN 18; Creatinine, Ser 1.38; Hemoglobin 15.2; Platelets 142; Potassium 4.4; Sodium 142; TSH 1.200  Recent Lipid Panel    Component Value Date/Time   CHOL 126 05/25/2021 0859   TRIG 76 05/25/2021 0859   HDL 48 05/25/2021 0859   CHOLHDL  2.6 05/25/2021 0859   CHOLHDL 2.6 04/28/2020 0842   VLDL 12 04/28/2020 0842   LDLCALC 63 05/25/2021 0859   LDLDIRECT 63 05/25/2021 0859    Physical Exam:    VS:  BP (!) 160/90 (BP Location: Left Arm, Patient Position: Sitting, Cuff Size: Normal)   Pulse 76   Ht 6' (1.829 m)   Wt 271 lb (122.9 kg)   SpO2 98%   BMI 36.75 kg/m     Wt Readings from Last 3 Encounters:  06/07/21 271 lb (122.9 kg)  05/25/21 268 lb (121.6 kg)  05/18/21 265 lb (120.2 kg)     GEN:  Well nourished, well developed in no acute distress.  Obese HEENT: Normal NECK: No JVD; No carotid bruits LYMPHATICS: No lymphadenopathy CARDIAC: RRR, no murmurs, rubs, gallops RESPIRATORY:  Clear to auscultation without rales, wheezing or rhonchi  ABDOMEN: Soft, non-tender, non-distended MUSCULOSKELETAL:  No edema; No deformity  SKIN: Warm and dry NEUROLOGIC:  Alert and oriented x 3 PSYCHIATRIC:  Normal affect       ASSESSMENT:    1. Chronic heart failure with preserved ejection fraction (HFpEF) (HCC)   2. Persistent atrial fibrillation ([place])   3. High risk medication use   4. Aneurysm of ascending aorta without rupture    PLAN:    In order of problems listed above:   #Chronic diastolic heart failure NYHA class II.  Warm and dry on exam.  Continue ramipril, Lasix.  #Persistent atrial fibrillation Currently taking amiodarone 100 mg by mouth once daily.  On Eliquis for stroke prophylaxis.  He is interested in avoiding the long-term off target effects of amiodarone.  I discussed alternative antiarrhythmic drug (Tikosyn) and ablation therapy.  I am concerned that his aorta is approaching surgical threshold.  Also his weight is at the upper limit for ablation procedures.  For these reasons, I would favor changing to an alternative antiarrhythmic drug for now.  We discussed Tikosyn at length during today's appointment.  We discussed the need to allow amiodarone to  washout for 4 to 6 months prior to loading with  Tikosyn.  He would like to proceed with this. He will stop amiodarone today.  #Ascending aortic aneurysm 4.7 on recent CTA.  Follows with Dr. Saunders Revel.    Medication Adjustments/Labs and Tests Ordered: Current medicines are reviewed at length with the patient today.  Concerns regarding medicines are outlined above.  No orders of the defined types were placed in this encounter.  No orders of the defined types were placed in this encounter.    Signed, Hilton Cork. Quentin Ore, MD, [facility], [facility] 06/07/2021 8:57 AM    Electrophysiology [facility]

## 2021-06-07 ENCOUNTER — Ambulatory Visit: Payer: Medicare Other | Admitting: Cardiology

## 2021-06-07 ENCOUNTER — Other Ambulatory Visit: Payer: Self-pay

## 2021-06-07 ENCOUNTER — Encounter: Payer: Self-pay | Admitting: Cardiology

## 2021-06-07 VITALS — BP 160/90 | HR 76 | Ht 72.0 in | Wt 271.0 lb

## 2021-06-07 DIAGNOSIS — I5032 Chronic diastolic (congestive) heart failure: Secondary | ICD-10-CM | POA: Diagnosis not present

## 2021-06-07 DIAGNOSIS — I7121 Aneurysm of the ascending aorta, without rupture: Secondary | ICD-10-CM | POA: Diagnosis not present

## 2021-06-07 DIAGNOSIS — I4819 Other persistent atrial fibrillation: Secondary | ICD-10-CM

## 2021-06-07 DIAGNOSIS — Z79899 Other long term (current) drug therapy: Secondary | ICD-10-CM | POA: Diagnosis not present

## 2021-06-07 NOTE — Patient Instructions (Addendum)
Medication Instructions:  Your physician has recommended you make the following change in your medication:    STOP amiodarone  Lab Work: None ordered. If you have labs (blood work) drawn today and your tests are completely normal, you will receive your results only by: [place] (if you have MyChart) OR A paper copy in the mail If you have any lab test that is abnormal or we need to change your treatment, we will call you to review the results.  Testing/Procedures: None ordered.  Follow-Up: At [facility], you and your health needs are our priority.  As part of our continuing mission to provide you with exceptional heart care, we have created designated Provider Care Teams.  These Care Teams include your primary Cardiologist (physician) and Advanced Practice Providers (APPs -  Physician Assistants and Nurse Practitioners) who all work together to provide you with the care you need, when you need it.  Your next appointment:    You will be contacted by Rushie Goltz RN with the afib clinic when it is time to schedule your dofetilide admission.  Tikosyn (Dofetilide) Hospital Admission  Prior to day of admission: Check with drug insurance company for cost of drug to ensure affordability --- Dofetilide 500 mcg twice a day.  GoodRx is an option if insurance copay is unaffordable.  All patients are tested for COVID-19 prior to admission.  No Benadryl is allowed 3 days prior to admission.  Please ensure no missed doses of your anticoagulation (blood thinner) for 3 weeks prior to admission. If a dose is missed please notify our office immediately.  A pharmacist will review all your medications for potential interactions with Tikosyn. If any medication changes are needed prior to admission we will be in touch with you.  If any new medications are started AFTER your admission date is set with Nurse, adult. Please notify our office immediately so your medication list can be updated and  reviewed by our pharmacist again. On day of admission: Tikosyn initiation requires a 3 night/4 day hospital stay with constant telemetry monitoring. You will have an EKG after each dose of Tikosyn as well as daily lab draws.  If the drug does not convert you to normal rhythm a cardioversion after the 4th dose of Tikosyn.  Afib Clinic office visit on the morning of admission is needed for preliminary labs/ekg.  Time of admission is dependent on bed availability in the hospital. In some instances, you will be sent home until bed is available. Rarely admission can be delayed to the following day if hospital census prevents available beds.  You may bring personal belongings/clothing with you to the hospital. Please leave your suitcase in the car until you arrive in admissions.  Questions please call our office at [phone]    AFIB CLINIC INFORMATION: Your appointment is scheduled on: ___ at _______. Please arrive 15 minutes early for check-in. The AFib Clinic is located in the Heart and Vascular Specialty Clinics at [facility] Parking instructions/directions: [address] (off [address]). When you pull in to Entrance C, there is an underground parking garage to your right. The code to enter the garage is _______. Take the elevators to the first floor. Follow the signs to the Heart and Vascular Specialty Clinics. You will see registration at the end of the hallway.  Phone number: [phone]

## 2021-06-12 ENCOUNTER — Ambulatory Visit: Payer: Self-pay

## 2021-06-12 ENCOUNTER — Ambulatory Visit (INDEPENDENT_AMBULATORY_CARE_PROVIDER_SITE_OTHER): Payer: Medicare Other | Admitting: Physical Medicine and Rehabilitation

## 2021-06-12 ENCOUNTER — Encounter: Payer: Self-pay | Admitting: Physical Medicine and Rehabilitation

## 2021-06-12 ENCOUNTER — Other Ambulatory Visit: Payer: Self-pay

## 2021-06-12 DIAGNOSIS — M25551 Pain in right hip: Secondary | ICD-10-CM | POA: Diagnosis not present

## 2021-06-12 NOTE — Progress Notes (Signed)
Pt state right hip pain. Pt state walking makes the pain worse. Pt state he takes over the counter pain meds tohelp ease his pain.  Numeric Pain Rating Scale and Functional Assessment Average Pain 2   In the last MONTH (on 0-10 scale) has pain interfered with the following?  1. General activity like being  able to carry out your everyday physical activities such as walking, climbing stairs, carrying groceries, or moving a chair?  Rating(6)   +Driver, +BT, -Dye Allergies.

## 2021-06-12 NOTE — Progress Notes (Signed)
   James [name] - 75 y.o. male MRN [id number]  Date of birth: 12-Oct-1945  Office Visit Note: Visit Date: 06/12/2021 PCP: Tonia Ghent, MD Referred by: Tonia Ghent, MD  Subjective: Chief Complaint  Patient presents with   Right Hip - Pain   HPI:  James [name] is a 75 y.o. male who comes in today at the request of Dr. Anderson Malta for planned Right anesthetic hip arthrogram with fluoroscopic guidance.  The patient has failed conservative care including home exercise, medications, time and activity modification.  This injection will be diagnostic and hopefully therapeutic.  Please see requesting physician notes for further details and justification.   ROS Otherwise per HPI.  Assessment & Plan: Visit Diagnoses:    ICD-10-CM   1. Pain in right hip  M25.551 Large Joint Inj: R hip joint    XR C-ARM NO REPORT      Plan: No additional findings.   Meds & Orders: No orders of the defined types were placed in this encounter.   Orders Placed This Encounter  Procedures   Large Joint Inj: R hip joint   XR C-ARM NO REPORT    Follow-up: Return for visit to requesting physician as needed.   Procedures: Large Joint Inj: R hip joint on 06/12/2021 1:14 PM Indications: diagnostic evaluation and pain Details: 22 G 3.5 in needle, fluoroscopy-guided anterior approach  Arthrogram: No  Medications: 4 mL bupivacaine 0.25 %; 60 mg triamcinolone acetonide 40 MG/ML Outcome: tolerated well, no immediate complications  There was excellent flow of contrast producing a partial arthrogram of the hip. The patient did have relief of symptoms during the anesthetic phase of the injection. Procedure, treatment alternatives, risks and benefits explained, specific risks discussed. Consent was given by the patient. Immediately prior to procedure a time out was called to verify the correct patient, procedure, equipment, support staff and site/side marked as required. Patient was prepped and draped in  the usual sterile fashion.         Clinical History: No specialty comments available.     Objective:  VS:  HT:    WT:   BMI:     BP:   HR: bpm  TEMP: ( )  RESP:  Physical Exam   Imaging: No results found.

## 2021-06-18 MED ORDER — TRIAMCINOLONE ACETONIDE 40 MG/ML IJ SUSP
60.0000 mg | INTRAMUSCULAR | Status: AC | PRN
  Administered 2021-06-12: 60 mg via INTRA_ARTICULAR

## 2021-06-18 MED ORDER — BUPIVACAINE HCL 0.25 % IJ SOLN
4.0000 mL | INTRAMUSCULAR | Status: AC | PRN
  Administered 2021-06-12: 4 mL via INTRA_ARTICULAR

## 2021-06-20 ENCOUNTER — Telehealth: Payer: Self-pay | Admitting: Pharmacist

## 2021-06-20 NOTE — Telephone Encounter (Signed)
Per Dr. Quentin Ore, amiodarone washout period of 4-6 months before starting Tikosyn.  Patient discontinued amiodarone on 06/07/21

## 2021-06-20 NOTE — Telephone Encounter (Signed)
Medication list reviewed in anticipation of upcoming Tikosyn initiation. Patient is not taking any contraindicated or QTc prolonging medications.   Recommend close monitoring of electrolytes due to patient being on furosemide.  Patient is anticoagulated on Eliquis on the appropriate dose. Please ensure that patient has not missed any anticoagulation doses in the 3 weeks prior to Tikosyn initiation.   Patient will need to be counseled to avoid use of Benadryl while on Tikosyn and in the 2-3 days prior to Tikosyn initiation.

## 2021-06-23 ENCOUNTER — Encounter (HOSPITAL_COMMUNITY): Payer: Self-pay

## 2021-06-23 NOTE — Telephone Encounter (Signed)
Will bring into office in 3 months to check amiodarone level per ricky

## 2021-07-07 ENCOUNTER — Encounter (HOSPITAL_COMMUNITY): Payer: Self-pay | Admitting: *Deleted

## 2021-07-08 ENCOUNTER — Emergency Department
Admission: EM | Admit: 2021-07-08 | Discharge: 2021-07-08 | Disposition: A | Discharge status: Home / Self Care | Payer: Medicare Other | Attending: Emergency Medicine | Admitting: Emergency Medicine

## 2021-07-08 ENCOUNTER — Emergency Department: Payer: Medicare Other

## 2021-07-08 ENCOUNTER — Ambulatory Visit
Admission: EM | Admit: 2021-07-08 | Discharge: 2021-07-08 | Disposition: A | Discharge status: Other Acute Inpatient Hospital | Payer: Medicare Other | Attending: Emergency Medicine | Admitting: Emergency Medicine

## 2021-07-08 ENCOUNTER — Other Ambulatory Visit: Payer: Self-pay

## 2021-07-08 ENCOUNTER — Encounter: Payer: Self-pay | Admitting: Emergency Medicine

## 2021-07-08 DIAGNOSIS — R9431 Abnormal electrocardiogram [ECG] [EKG]: Secondary | ICD-10-CM

## 2021-07-08 DIAGNOSIS — G4733 Obstructive sleep apnea (adult) (pediatric): Secondary | ICD-10-CM | POA: Diagnosis not present

## 2021-07-08 DIAGNOSIS — I509 Heart failure, unspecified: Secondary | ICD-10-CM | POA: Diagnosis not present

## 2021-07-08 DIAGNOSIS — Z7901 Long term (current) use of anticoagulants: Secondary | ICD-10-CM | POA: Insufficient documentation

## 2021-07-08 DIAGNOSIS — Z801 Family history of malignant neoplasm of trachea, bronchus and lung: Secondary | ICD-10-CM | POA: Diagnosis not present

## 2021-07-08 DIAGNOSIS — Z79899 Other long term (current) drug therapy: Secondary | ICD-10-CM | POA: Insufficient documentation

## 2021-07-08 DIAGNOSIS — U071 COVID-19: Secondary | ICD-10-CM | POA: Diagnosis not present

## 2021-07-08 DIAGNOSIS — R059 Cough, unspecified: Secondary | ICD-10-CM | POA: Diagnosis not present

## 2021-07-08 DIAGNOSIS — I110 Hypertensive heart disease with heart failure: Secondary | ICD-10-CM | POA: Diagnosis not present

## 2021-07-08 DIAGNOSIS — R002 Palpitations: Secondary | ICD-10-CM | POA: Diagnosis not present

## 2021-07-08 DIAGNOSIS — R0602 Shortness of breath: Secondary | ICD-10-CM

## 2021-07-08 DIAGNOSIS — R000 Tachycardia, unspecified: Secondary | ICD-10-CM

## 2021-07-08 DIAGNOSIS — Z8679 Personal history of other diseases of the circulatory system: Secondary | ICD-10-CM | POA: Diagnosis not present

## 2021-07-08 DIAGNOSIS — E039 Hypothyroidism, unspecified: Secondary | ICD-10-CM | POA: Diagnosis not present

## 2021-07-08 DIAGNOSIS — Z823 Family history of stroke: Secondary | ICD-10-CM | POA: Insufficient documentation

## 2021-07-08 DIAGNOSIS — R079 Chest pain, unspecified: Secondary | ICD-10-CM | POA: Diagnosis not present

## 2021-07-08 DIAGNOSIS — Z87891 Personal history of nicotine dependence: Secondary | ICD-10-CM | POA: Insufficient documentation

## 2021-07-08 LAB — CBC
HCT: 43.2 % (ref 39.0–52.0)
Hemoglobin: 14.5 g/dL (ref 13.0–17.0)
MCH: 32 pg (ref 26.0–34.0)
MCHC: 33.6 g/dL (ref 30.0–36.0)
MCV: 95.4 fL (ref 80.0–100.0)
Platelets: 136 10*3/uL — ABNORMAL LOW (ref 150–400)
RBC: 4.53 MIL/uL (ref 4.22–5.81)
RDW: 13.5 % (ref 11.5–15.5)
WBC: 9 10*3/uL (ref 4.0–10.5)
nRBC: 0 % (ref 0.0–0.2)

## 2021-07-08 LAB — BASIC METABOLIC PANEL
Anion gap: 7 (ref 5–15)
BUN: 22 mg/dL (ref 8–23)
CO2: 23 mmol/L (ref 22–32)
Calcium: 8.8 mg/dL — ABNORMAL LOW (ref 8.9–10.3)
Chloride: 107 mmol/L (ref 98–111)
Creatinine, Ser: 1.38 mg/dL — ABNORMAL HIGH (ref 0.61–1.24)
GFR, Estimated: 54 mL/min — ABNORMAL LOW (ref >60)
Glucose, Bld: 120 mg/dL — ABNORMAL HIGH (ref 70–99)
Potassium: 3.5 mmol/L (ref 3.5–5.1)
Sodium: 137 mmol/L (ref 135–145)

## 2021-07-08 LAB — RESP PANEL BY RT-PCR (FLU A&B, COVID) ARPGX2
Influenza A by PCR: NEGATIVE
Influenza B by PCR: NEGATIVE
SARS Coronavirus 2 by RT PCR: POSITIVE — AB

## 2021-07-08 LAB — TROPONIN I (HIGH SENSITIVITY)
Troponin I (High Sensitivity): 6 ng/L (ref <18)
Troponin I (High Sensitivity): 7 ng/L (ref <18)

## 2021-07-08 LAB — BRAIN NATRIURETIC PEPTIDE: B Natriuretic Peptide: 120.5 pg/mL — ABNORMAL HIGH (ref 0.0–100.0)

## 2021-07-08 IMAGING — CR DG CHEST 2V
2 series · 2 of 2 positions shown · non-contrast
Comparison: [DATE]

CLINICAL DATA: Chest pain.  Cough and congestion.

EXAM:
CHEST - 2 VIEW

[chest pa]
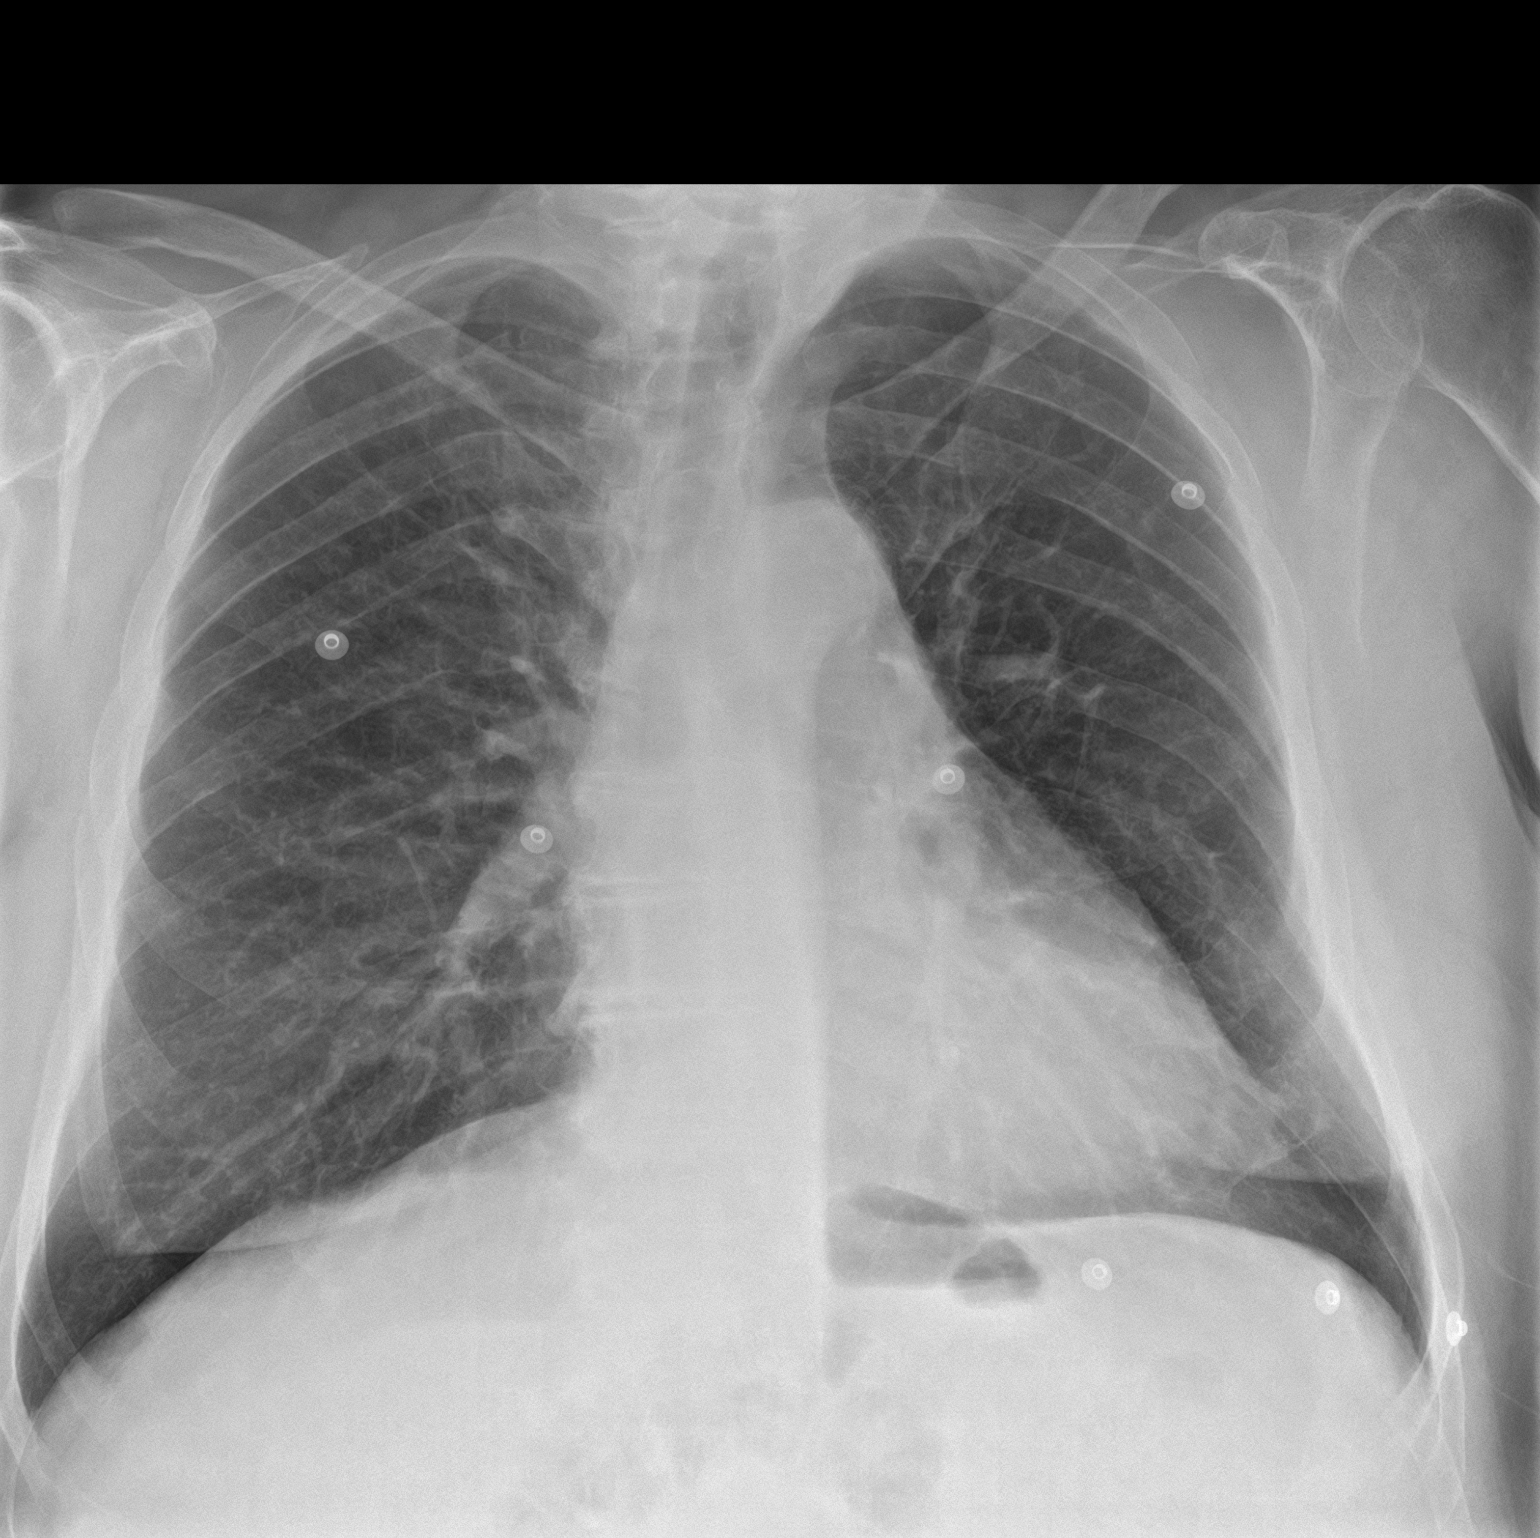

[chest lat]
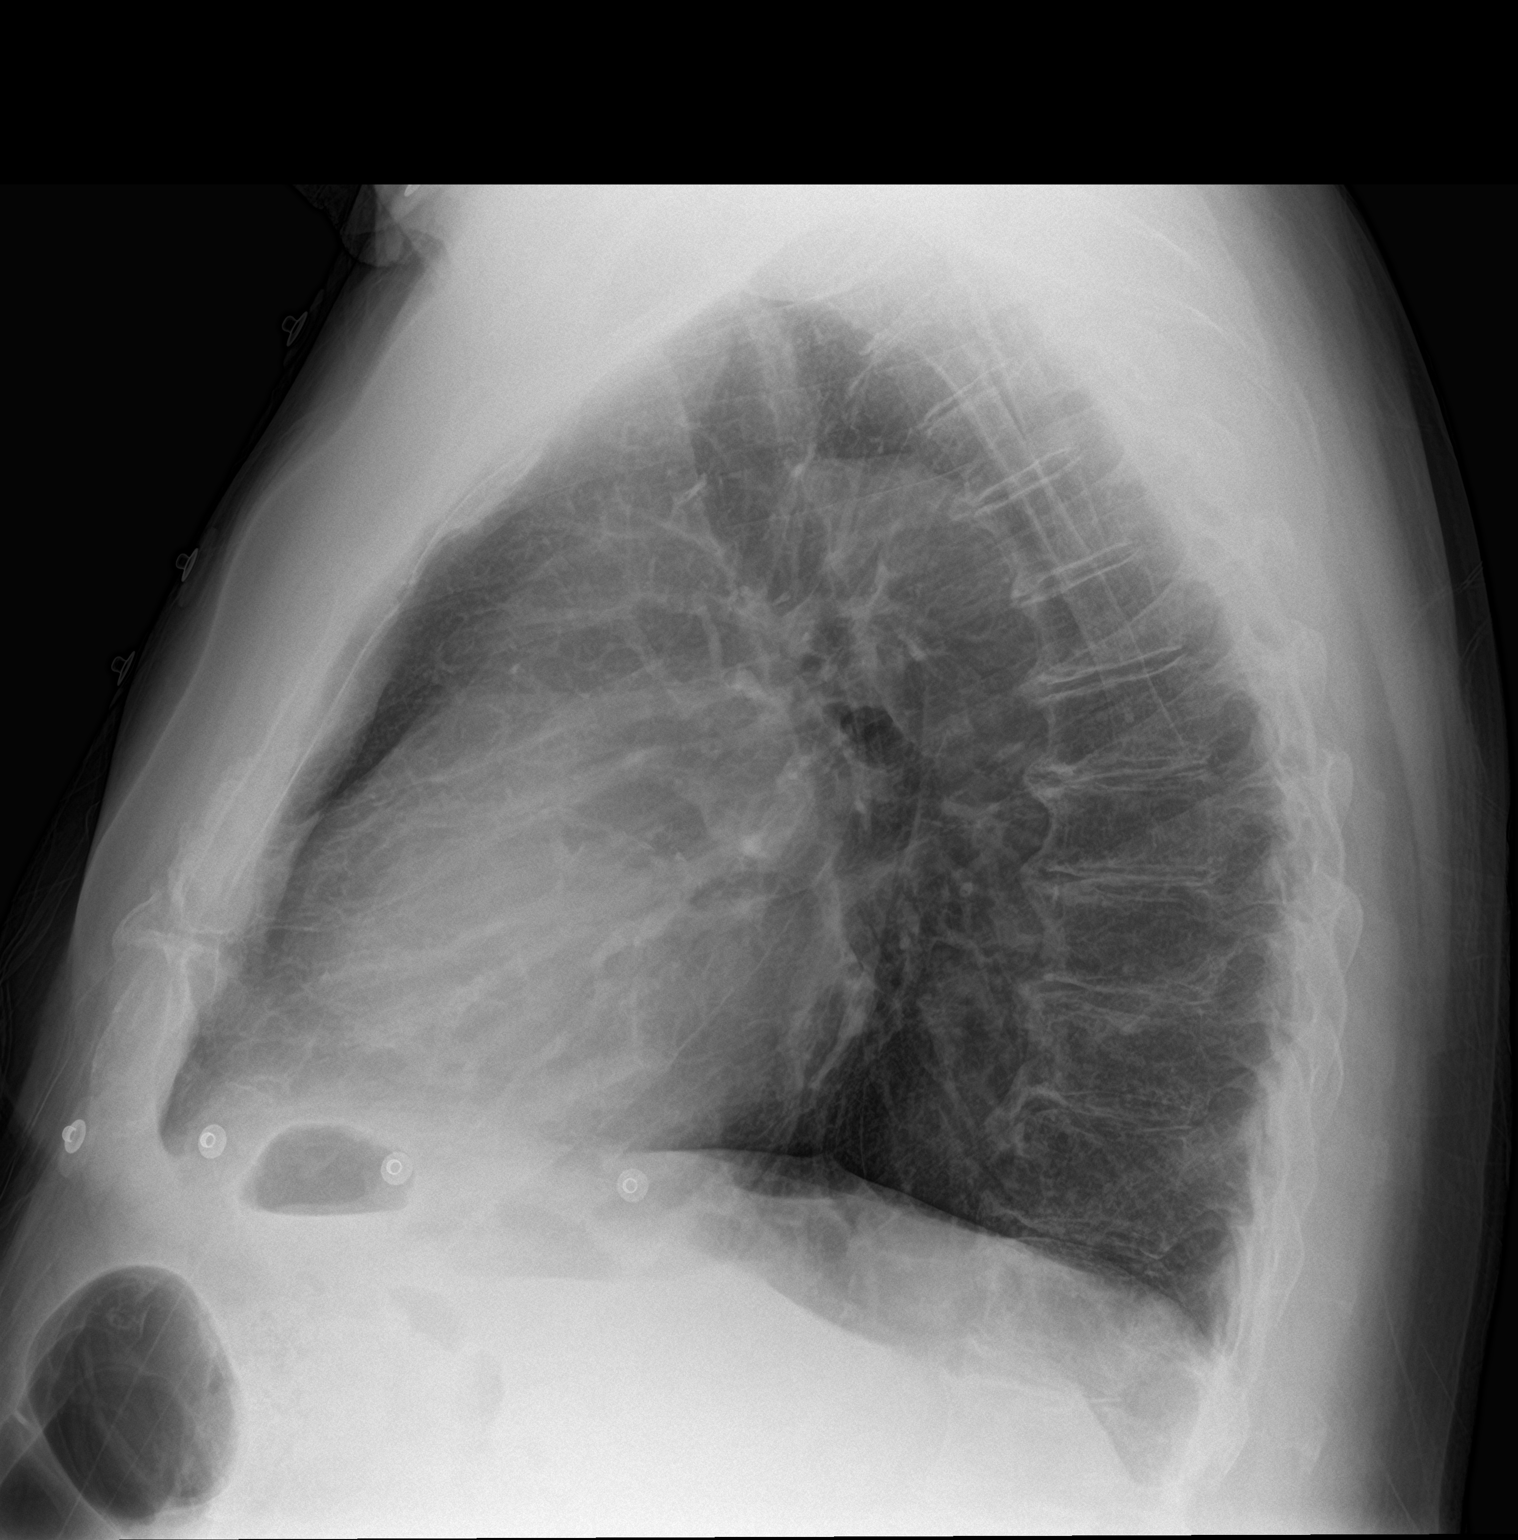

[2 of 2 positions shown; findings below may reference images not displayed]

FINDINGS: Both lungs are clear. Heart size is within normal limits and stable.
Degenerative changes in thoracic spine. No large pleural effusions.
Negative for a pneumothorax.
IMPRESSION: No active cardiopulmonary disease.

## 2021-07-08 MED ORDER — NIRMATRELVIR/RITONAVIR (PAXLOVID)TABLET: 3.0000 | ORAL_TABLET | Freq: Two times a day (BID) | ORAL | 0 refills | Status: AC

## 2021-07-08 NOTE — ED Provider Notes (Signed)
[facility] Emergency Department Provider Note  Time seen: 11:52 AM  I have reviewed the triage vital signs and the nursing notes.   HISTORY  Chief Complaint Palpitations  HPI James [name] is a 75 y.o. male with a past medical history of CHF, CAD, hyperlipidemia, presents to the emergency department for sensation of palpitations/rapid heartbeat as well as mild shortness of breath.  According to the patient since last night he has been feeling like his heart was racing and feeling somewhat short of breath at times.  Patient denies any chest pain yesterday or today.  No increased lower extremity swelling over baseline.  Patient went to the walk-in clinic today for an evaluation as his primary care doctor is not open on the weekend, he was referred to the emergency department for further evaluation.     Patient Active Problem List   Diagnosis Date Noted   Thoracic aortic aneurysm without rupture 11/25/2019   Chronic heart failure with preserved ejection fraction (HFpEF) ([place]) 08/18/2018   Hyperlipidemia LDL goal <70 08/18/2018   Obstructive sleep apnea 05/23/2018   Coronary artery disease involving native coronary artery of native heart without angina pectoris 05/16/2018   Unstable angina (HCC)    CHF (congestive heart failure) ([place]) 05/09/2018   Benign prostatic hyperplasia with nocturia 05/07/2018   Dyspnea on exertion 02/19/2018   Persistent atrial fibrillation ([place]) 02/12/2018   Cardiac murmur 02/12/2018   Knee pain 08/07/2017   Weak urinary stream 05/01/2016   Healthcare maintenance 05/01/2016   Hematuria 03/06/2016   Hx of adenomatous colonic polyps 12/05/2015   Advance care planning 07/21/2014   Medicare annual wellness visit, subsequent 07/01/2012   External hemorrhoids 06/27/2011   Hypothyroidism 02/24/2008   HYPERCHOLESTEROLEMIA 02/21/2007   Essential hypertension 02/21/2007   DEGENERATIVE JOINT DISEASE, CERVICAL SPINE 02/21/2007    Past  Surgical History:  Procedure Laterality Date   BROW LIFT Bilateral 10/23/2016   Procedure: BLEPHAROPLASTY upper eyelid with excess skin;  Surgeon: Karle Starch, MD;  Location: [facility];  Service: Ophthalmology;  Laterality: Bilateral;  MAC   CARDIOVERSION N/A 03/18/2018   Procedure: CARDIOVERSION;  Surgeon: Nelva Bush, MD;  Location: [place] [facility];  Service: Cardiovascular;  Laterality: N/A;   CARDIOVERSION N/A 05/06/2018   Procedure: CARDIOVERSION;  Surgeon: Nelva Bush, MD;  Location: [facility];  Service: Cardiovascular;  Laterality: N/A;   CATARACT EXTRACTION  1986   OD   CATARACT EXTRACTION Bilateral 1995   x 2 for right and left    COLONOSCOPY     CORONARY STENT INTERVENTION N/A 05/13/2018   Procedure: CORONARY STENT INTERVENTION;  Surgeon: Nelva Bush, MD;  Location: [place] [facility];  Service: Cardiovascular;  Laterality: N/A;   ECTROPION REPAIR Bilateral 10/23/2016   Procedure: REPAIR OF ECTROPION sutures, extensive;  Surgeon: Karle Starch, MD;  Location: [place];  Service: Ophthalmology;  Laterality: Bilateral;   LIPOMA EXCISION  06/1997   left neck (Juengel)   MOHS SURGERY Right 2013   behind right ear   RIGHT/LEFT HEART CATH AND CORONARY ANGIOGRAPHY N/A 05/12/2018   Procedure: RIGHT/LEFT HEART CATH AND CORONARY ANGIOGRAPHY;  Surgeon: Wellington Hampshire, MD;  Location: [place] [facility];  Service: Cardiovascular;  Laterality: N/A;   TONSILLECTOMY     VASECTOMY  1982    Prior to Admission medications   Medication Sig Start Date End Date Taking? Authorizing Provider  Acetaminophen (TYLENOL 8 HOUR PO) Take 200 mg by mouth. Taking PRN    [provider]  apixaban (ELIQUIS) 5 MG TABS tablet TAKE 1 TABLET(5 MG) BY MOUTH TWICE DAILY 05/03/21   Tonia Ghent, MD  atorvastatin (LIPITOR) 80 MG tablet Take 1 tablet (80 mg total) by mouth daily at 6 PM. 09/05/20   End, Harrell Gave, MD  DHA-EPA-Coenzyme Q10-Vitamin E (CO Q-10 VITAMIN E FISH OIL  PO) Take 1 capsule by mouth daily.    [provider]  diclofenac Sodium (VOLTAREN) 1 % GEL Apply 2 g topically 4 (four) times daily as needed. 09/05/20   Tonia Ghent, MD  doxazosin (CARDURA) 4 MG tablet Take 1 tablet (4 mg total) by mouth daily after breakfast. 12/20/20   Tonia Ghent, MD  ezetimibe (ZETIA) 10 MG tablet TAKE 1 TABLET BY MOUTH DAILY. 05/26/21   End, Harrell Gave, MD  finasteride (PROSCAR) 5 MG tablet TAKE 1 TABLET BY MOUTH EVERY DAY 09/05/20   Tonia Ghent, MD  furosemide (LASIX) 20 MG tablet Take 1 tablet (20 mg total) by mouth every other day 02/09/21   Tonia Ghent, MD  levothyroxine (SYNTHROID) 150 MCG tablet Take 1 tablet (150 mcg total) by mouth daily before breakfast. Except take 1/2 tablet on Sundays.  Total of 6.5 tablets in 1 week. 05/24/21   Tonia Ghent, MD  Multiple Vitamin (MULTIVITAMIN WITH MINERALS) TABS tablet Take 1 tablet by mouth daily. Senior Multivitamin    [provider]  polyethylene glycol (MIRALAX) 17 g packet Take 17 g by mouth 2 (two) times daily. 04/07/20   Armbruster, Carlota Raspberry, MD  ramipril (ALTACE) 10 MG capsule TAKE 1 CAPSULE BY MOUTH DAILY GENERIC EQUIVALENT FOR ALTACE 10/25/20   Tonia Ghent, MD  Vitamins/Minerals TABS Take 1 tablet by mouth daily.    [provider]    Allergies  Allergen Reactions   Sulfonamide Derivatives Rash    Family History  Problem Relation Age of Onset   Stroke Mother    Lung cancer Maternal Grandfather        smoker   Stroke Maternal Grandfather    Heart disease Paternal Grandfather        MI, old age   Colon cancer Neg Hx    Colon polyps Neg Hx    Esophageal cancer Neg Hx    Rectal cancer Neg Hx    Stomach cancer Neg Hx    Bladder Cancer Neg Hx    Prostate cancer Neg Hx     Social History Social History   Tobacco Use   Smoking status: Former    Packs/day: 2.00    Years: 20.00    Pack years: 40.00    Types: Cigarettes    Quit date: 08/07/1983    Years  since quitting: 37.9   Smokeless tobacco: Never   Tobacco comments:    quit over 40 years ago  Vaping Use   Vaping Use: Never used  Substance Use Topics   Alcohol use: Yes    Alcohol/week: 1.0 standard drink    Types: 1 Shots of liquor per week    Comment: 1 at night before bed   Drug use: No    Review of Systems Constitutional: Negative for fever. Cardiovascular: Negative for chest pain.  Positive for palpitations Respiratory: Mild intermittent shortness of breath, none currently Gastrointestinal: Negative for abdominal pain, vomiting and diarrhea. Neurological: Negative for headache All other ROS negative  ____________________________________________   PHYSICAL EXAM:  VITAL SIGNS: ED Triage Vitals [07/08/21 0850]  Enc Vitals Group     BP (!) 124/92  Pulse Rate (!) 114     Resp (!) 22     Temp 97.9 F (36.6 C)     Temp Source Oral     SpO2 94 %     Weight 255 lb (115.7 kg)     Height 6' (1.829 m)     Head Circumference      Peak Flow      Pain Score 0     Pain Loc      Pain Edu?      Excl. in [place]?    Constitutional: Alert and oriented. Well appearing and in no distress. Eyes: Normal exam ENT      Head: Normocephalic and atraumatic.      Mouth/Throat: Mucous membranes are moist. Cardiovascular: Normal rate, regular rhythm around 100 bpm. Respiratory: Normal respiratory effort without tachypnea nor retractions. Breath sounds are clear  Gastrointestinal: Soft and nontender. No distention. Musculoskeletal: Nontender with normal range of motion in all extremities.  Slight edema bilaterally. Neurologic:  Normal speech and language. No gross focal neurologic deficits Skin:  Skin is warm, dry and intact.  Psychiatric: Mood and affect are normal.   ____________________________________________    EKG  EKG viewed and interpreted by myself shows sinus tachycardia 110 bpm with a narrow QRS, normal axis, normal intervals, nonspecific ST  changes.  ____________________________________________    RADIOLOGY  Chest x-ray is clear  ____________________________________________   INITIAL IMPRESSION / ASSESSMENT AND PLAN / ED COURSE  Pertinent labs & imaging results that were available during my care of the patient were reviewed by me and considered in my medical decision making (see chart for details).   Patient presents emergency department for palpitations/heart racing sensation last night and again this morning.  States slight shortness of breath yesterday with the palpitations, none currently.  No chest pain at any point.  Patient was seen at the walk-in clinic and was referred to the emergency department given sinus tachycardia around 120 bpm.  Currently patient has a heart rate around 100 bpm.  Initial work-up is reassuring.  Patient's chest x-ray is clear, lab work is largely within normal limits including a negative troponin.  We will repeat a troponin as a precaution.  BNP is slightly elevated however largely unchanged in fact decreased from historical values.  We will also check a COVID/flu swab as a precaution overall patient appears well satting 99% on room air heart rate around 100 bpm what appears to be a sinus rhythm.  Patient's repeat troponin is negative.  Patient has tested positive for COVID which likely explains the patient's symptoms.  Given his comorbidities we will discharge with Paxlovid.  Discussed return precautions.  Patient agreeable to plan of care.  AUGUSTIN BUN was evaluated in Emergency Department on 07/08/2021 for the symptoms described in the history of present illness. He was evaluated in the context of the global COVID-19 pandemic, which necessitated consideration that the patient might be at risk for infection with the SARS-CoV-2 virus that causes COVID-19. Institutional protocols and algorithms that pertain to the evaluation of patients at risk for COVID-19 are in a state of rapid change based on  information released by regulatory bodies including the CDC and federal and state organizations. These policies and algorithms were followed during the patient's care in the ED.  ____________________________________________   FINAL CLINICAL IMPRESSION(S) / ED DIAGNOSES  Palpitations COVID-19   Harvest Dark, MD 07/08/21 1310

## 2021-07-08 NOTE — ED Triage Notes (Signed)
Pt here with congestion and cough since last night. Is shaky and states his HR is high.

## 2021-07-08 NOTE — Discharge Instructions (Addendum)
Go to the emergency department for evaluation of your shortness of breath, elevated heart rate, abnormal EKG .

## 2021-07-08 NOTE — ED Notes (Signed)
Dc ppw provided. Pt provided rx information. Questions answered for patient. Pt verbalizes consent for dc. Pt assisted off unit on foot.

## 2021-07-08 NOTE — ED Provider Notes (Signed)
[facility] James [name]    CSN: [id number] Arrival date & time: 07/08/21  0809      History   Chief Complaint Chief Complaint  Patient presents with   Cough   Nasal Congestion   Tachycardia     HPI James [name] is a 75 y.o. male.  Patient presents with shortness of breath, rapid heart rate, trembling and shaky, and cough since last night.  No treatments attempted.  He denies chest pain, focal weakness, fever, or other symptoms.  His medical history includes chronic heart failure, hypertension, atrial fibrillation.  The history is provided by the patient and medical records.   Past Medical History:  Diagnosis Date   (HFpEF) heart failure with preserved ejection fraction ([place])    a. 05/2018 Echo: EF 55-60%, gr2 DD.   Acute lower GI bleeding after colonoscopy and polypectomy, resolved 12/12/2015   Ascending aortic aneurysm    a. 02/2018 Echo: mildly dil Ao root/asc ao/arch; b. 05/2018 CTA Chest: 4.7cm fusiform aneurysm of Asc Ao.   CAD (coronary artery disease)    a. 05/2018 Cath/PCI: LM nl,LAD 30p, 90/99d/apical, LCX 90m OM1/2/3 min irregs, RCA 85p (3.5x18 [place].   Cardiac murmur    a. 02/2018 Echo: EF 60-65%, no rwma, mild AI, mildly dil Ao root/Asc Ao/Arch, Mild MR, mildly dil LA. Nl RV fxn.   Cataract    bilateral surgery to remove   CHF (congestive heart failure) (HCC)    Colon polyp    Constipation    Essential hypertension    Hemorrhoids    Hepatitis    self resolved, likely food exposure, 1974.    History of cardiovascular stress test    a. 02/2018 Myoview: EF 55-65%, small/mild apical defect w/ nl wall motion ->attenuation artifact. No ischemia. Low risk study.   Hx of adenomatous colonic polyps 12/05/2015   Hyperlipidemia    Hypothyroidism    Mitral regurgitation    a. 110/2019 Echo: EF 55-60%. Gr2 DD. Triv AI. Ao root 311m Mild MR. Mod dil LA, mildly dil RA.   Osteoarthritis    Persistent atrial fibrillation ([place]   a. Dx 02/2018. CHA2DS2VASc =  2-->Eliquis; b. 03/2018 DCCV (200J); c. 03/2018 recurrent Afib-->flecainide started 04/2018;  d. 05/06/2018 s/p successful DCCV - 200J x 2-->on amio.   Skin cancer    basal cell, R ear, MOHS    Patient Active Problem List   Diagnosis Date Noted   Thoracic aortic aneurysm without rupture 11/25/2019   Chronic heart failure with preserved ejection fraction ([place]pEF) ([place]/13[place]   Hyperlipidemia LDL goal <70 08/18/2018   Obstructive sleep apnea 05/23/2018   Coronary artery disease involving native coronary artery of native heart without angina pectoris 05/16/2018   Unstable angina (HCC)    [place] ([place]ive heart failure) [place]/11[place]   Benign prostatic hyperplasia with nocturia 05/07/2018   Dyspnea on exertion 02/19/2018   Persistent [place] fibrillation ([place]/05/2018   Cardiac murmur 02/12/2018   Knee pain 08/07/2017   Weak urinary stream 05/01/2016   Healthcare maintenance 05/01/2016   Hematuria 03/06/2016   Hx of adenomatous colonic polyps 12/05/2015   Advance care planning 07/21/2014   Medicare annual wellness visit, subsequent 07/01/2012   External hemorrhoids 06/27/2011   Hypothyroidism 02/24/2008   HYPERCHOLESTEROLEMIA 02/21/2007   Essential hypertension 02/21/2007   DEGENERATIVE JOINT DISEASE, CERVICAL SPINE 02/21/2007    Past Surgical History:  Procedure Laterality Date   BROW LIFT Bilateral 10/23/2016   Procedure: BLEPHAROPLASTY upper eyelid with excess skin;  Surgeon:  Amy Dennie Maizes, MD;  Location: [place];  Service: Ophthalmology;  Laterality: Bilateral;  MAC   CARDIOVERSION N/A 03/18/2018   Procedure: CARDIOVERSION;  Surgeon: Nelva Bush, MD;  Location: [place] [facility];  Service: Cardiovascular;  Laterality: N/A;   CARDIOVERSION N/A 05/06/2018   Procedure: CARDIOVERSION;  Surgeon: Nelva Bush, MD;  Location: [facility];  Service: Cardiovascular;  Laterality: N/A;   CATARACT EXTRACTION  1986   OD   CATARACT EXTRACTION Bilateral 1995   x 2 for right and  left    COLONOSCOPY     CORONARY STENT INTERVENTION N/A 05/13/2018   Procedure: CORONARY STENT INTERVENTION;  Surgeon: Nelva Bush, MD;  Location: [place] [facility];  Service: Cardiovascular;  Laterality: N/A;   ECTROPION REPAIR Bilateral 10/23/2016   Procedure: REPAIR OF ECTROPION sutures, extensive;  Surgeon: Karle Starch, MD;  Location: [place];  Service: Ophthalmology;  Laterality: Bilateral;   LIPOMA EXCISION  06/1997   left neck (Juengel)   MOHS SURGERY Right 2013   behind right ear   RIGHT/LEFT HEART CATH AND CORONARY ANGIOGRAPHY N/A 05/12/2018   Procedure: RIGHT/LEFT HEART CATH AND CORONARY ANGIOGRAPHY;  Surgeon: Wellington Hampshire, MD;  Location: [place] [facility];  Service: Cardiovascular;  Laterality: N/A;   TONSILLECTOMY     VASECTOMY  1982       Home Medications    Prior to Admission medications   Medication Sig Start Date End Date Taking? Authorizing Provider  Acetaminophen (TYLENOL 8 HOUR PO) Take 200 mg by mouth. Taking PRN    [provider]  apixaban (ELIQUIS) 5 MG TABS tablet TAKE 1 TABLET(5 MG) BY MOUTH TWICE DAILY 05/03/21   Tonia Ghent, MD  atorvastatin (LIPITOR) 80 MG tablet Take 1 tablet (80 mg total) by mouth daily at 6 PM. 09/05/20   End, Harrell Gave, MD  DHA-EPA-Coenzyme Q10-Vitamin E (CO Q-10 VITAMIN E FISH OIL PO) Take 1 capsule by mouth daily.    [provider]  diclofenac Sodium (VOLTAREN) 1 % GEL Apply 2 g topically 4 (four) times daily as needed. 09/05/20   Tonia Ghent, MD  doxazosin (CARDURA) 4 MG tablet Take 1 tablet (4 mg total) by mouth daily after breakfast. 12/20/20   Tonia Ghent, MD  ezetimibe (ZETIA) 10 MG tablet TAKE 1 TABLET BY MOUTH DAILY. 05/26/21   End, Harrell Gave, MD  finasteride (PROSCAR) 5 MG tablet TAKE 1 TABLET BY MOUTH EVERY DAY 09/05/20   Tonia Ghent, MD  furosemide (LASIX) 20 MG tablet Take 1 tablet (20 mg total) by mouth every other day 02/09/21   Tonia Ghent, MD   levothyroxine (SYNTHROID) 150 MCG tablet Take 1 tablet (150 mcg total) by mouth daily before breakfast. Except take 1/2 tablet on Sundays.  Total of 6.5 tablets in 1 week. 05/24/21   Tonia Ghent, MD  Multiple Vitamin (MULTIVITAMIN WITH MINERALS) TABS tablet Take 1 tablet by mouth daily. Senior Multivitamin    [provider]  polyethylene glycol (MIRALAX) 17 g packet Take 17 g by mouth 2 (two) times daily. 04/07/20   Armbruster, Carlota Raspberry, MD  ramipril (ALTACE) 10 MG capsule TAKE 1 CAPSULE BY MOUTH DAILY GENERIC EQUIVALENT FOR ALTACE 10/25/20   Tonia Ghent, MD  Vitamins/Minerals TABS Take 1 tablet by mouth daily.    [provider]    Family History Family History  Problem Relation Age of Onset   Stroke Mother    Lung cancer Maternal Grandfather  smoker   Stroke Maternal Grandfather    Heart disease Paternal Grandfather        MI, old age   Colon cancer Neg Hx    Colon polyps Neg Hx    Esophageal cancer Neg Hx    Rectal cancer Neg Hx    Stomach cancer Neg Hx    Bladder Cancer Neg Hx    Prostate cancer Neg Hx     Social History Social History   Tobacco Use   Smoking status: Former    Packs/day: 2.00    Years: 20.00    Pack years: 40.00    Types: Cigarettes    Quit date: 08/07/1983    Years since quitting: 37.9   Smokeless tobacco: Never   Tobacco comments:    quit over 40 years ago  Vaping Use   Vaping Use: Never used  Substance Use Topics   Alcohol use: Yes    Alcohol/week: 1.0 standard drink    Types: 1 Shots of liquor per week    Comment: 1 at night before bed   Drug use: No     Allergies   Sulfonamide derivatives   Review of Systems Review of Systems  Constitutional:  Negative for chills and fever.  Respiratory:  Positive for cough and shortness of breath.   Cardiovascular:  Negative for chest pain and palpitations.  Gastrointestinal:  Negative for nausea and vomiting.  Neurological:  Positive for tremors. Negative for  dizziness, seizures, syncope, facial asymmetry, weakness, light-headedness and numbness.  All other systems reviewed and are negative.   Physical Exam Triage Vital Signs ED Triage Vitals  Enc Vitals Group     BP      Pulse      Resp      Temp      Temp src      SpO2      Weight      Height      Head Circumference      Peak Flow      Pain Score      Pain Loc      Pain Edu?      Excl. in [place]?    No data found.  Updated Vital Signs BP 130/79   Pulse (!) 124   Temp 98.8 F (37.1 C) (Oral)   Resp (!) 24   SpO2 97%   Visual Acuity Right Eye Distance:   Left Eye Distance:   Bilateral Distance:    Right Eye Near:   Left Eye Near:    Bilateral Near:     Physical Exam Vitals and nursing note reviewed.  Constitutional:      General: He is not in acute distress.    Appearance: He is well-developed. He is ill-appearing.  HENT:     Mouth/Throat:     Mouth: Mucous membranes are moist.  Cardiovascular:     Rate and Rhythm: Regular rhythm. Tachycardia present.     Heart sounds: Normal heart sounds.  Pulmonary:     Effort: Pulmonary effort is normal. No respiratory distress.     Breath sounds: Normal breath sounds.  Abdominal:     Palpations: Abdomen is soft.     Tenderness: There is no abdominal tenderness.  Musculoskeletal:     Cervical back: Neck supple.     Right lower leg: No edema.     Left lower leg: No edema.  Skin:    General: Skin is warm and dry.  Neurological:     General:  No focal deficit present.     Mental Status: He is alert and oriented to person, place, and time.     Sensory: No sensory deficit.     Motor: No weakness.     Gait: Gait normal.  Psychiatric:        Mood and Affect: Mood normal.        Behavior: Behavior normal.     UC Treatments / Results  Labs (all labs ordered are listed, but only abnormal results are displayed) Labs Reviewed - No data to display  EKG   Radiology No results found.  Procedures Procedures (including  critical care time)  Medications Ordered in UC Medications - No data to display  Initial Impression / Assessment and Plan / UC Course  I have reviewed the triage vital signs and the nursing notes.  Pertinent labs & imaging results that were available during my care of the patient were reviewed by me and considered in my medical decision making (see chart for details).    Shortness of breath, tachycardia, abnormal EKG.  EKG shows sinus tachycardia, rate 123, no ST elevation, compared to previous from 05/25/2021.  Sending patient to the ED for evaluation.  He declines EMS.  He states his wife drove him here and will drive him to the ED.  He states he feels stable for transport POV.      Final Clinical Impressions(s) / UC Diagnoses   Final diagnoses:  Shortness of breath  Tachycardia  Abnormal EKG     Discharge Instructions      Go to the emergency department for evaluation of your shortness of breath, elevated heart rate, abnormal EKG .     ED Prescriptions   None    PDMP not reviewed this encounter.   Sharion Balloon, NP 07/08/21 [phone]

## 2021-07-08 NOTE — ED Notes (Signed)
Pt straight stuck for troponin. Lab sent

## 2021-07-08 NOTE — ED Triage Notes (Signed)
Pt to ED for shob that started this am. Reports went to UC and was told to come to ED d.t high HR.  Hx CHF

## 2021-07-08 NOTE — ED Notes (Signed)
Md made aware of [pt covid + result

## 2021-07-17 ENCOUNTER — Other Ambulatory Visit: Payer: Self-pay | Admitting: Family Medicine

## 2021-08-16 NOTE — Progress Notes (Signed)
Subjective:   James [name] is a 76 y.o. male who presents for Medicare Annual/Subsequent preventive examination.  I connected with James [name] today by telephone and verified that I am speaking with the correct person using two identifiers. Location patient: home Location provider: work Persons participating in the virtual visit: patient, Marine scientist.    I discussed the limitations, risks, security and privacy concerns of performing an evaluation and management service by telephone and the availability of in person appointments. I also discussed with the patient that there may be a patient responsible charge related to this service. The patient expressed understanding and verbally consented to this telephonic visit.    Interactive audio and video telecommunications were attempted between this provider and patient, however failed, due to patient having technical difficulties OR patient did not have access to video capability.  We continued and completed visit with audio only.  Some vital signs may be absent or patient reported.   Time Spent with patient on telephone encounter: 20 minutes  Review of Systems     Cardiac Risk Factors include: advanced age (>26mn, >>27women);dyslipidemia;hypertension     Objective:    Today's Vitals   08/17/21 0815  Weight: 255 lb (115.7 kg)  Height: 6' (1.829 m)   Body mass index is 34.58 kg/m.  Advanced Directives 08/17/2021 08/16/2020 08/11/2019 08/08/2018 05/13/2018 05/12/2018 05/09/2018  Does Patient Have a Medical Advance Directive? _0  Yes Yes  Type of AParamedicof [place]Living will [place]Living will [place]stLiving will [place]Living will - Living will;Healthcare Power of [place]Living will  Does patient want to make changes to medical advance directive? Yes (MAU/Ambulatory/Procedural Areas - Information given) - -  - - No - Patient declined No - Patient declined  Copy of [place] Chart? Yes - validated most recent copy scanned in chart (See row information) Yes - validated most recent copy scanned in chart (See row information) Yes - validated most recent copy scanned in chart (See row information) No - copy requested - No - copy requested No - copy requested    Current Medications (verified) Outpatient Encounter Medications as of 08/17/2021  Medication Sig   Acetaminophen (TYLENOL 8 HOUR PO) Take 200 mg by mouth. Taking PRN   apixaban (ELIQUIS) 5 MG TABS tablet TAKE 1 TABLET(5 MG) BY MOUTH TWICE DAILY   atorvastatin (LIPITOR) 80 MG tablet Take 1 tablet (80 mg total) by mouth daily at 6 PM.   DHA-EPA-Coenzyme Q10-Vitamin E (CO Q-10 VITAMIN E FISH OIL PO) Take 1 capsule by mouth daily.   diclofenac Sodium (VOLTAREN) 1 % GEL Apply 2 g topically 4 (four) times daily as needed.   doxazosin (CARDURA) 4 MG tablet TAKE 1 TABLET BY MOUTH DAILY AFTER BREAKFAST.   ezetimibe (ZETIA) 10 MG tablet TAKE 1 TABLET BY MOUTH DAILY.   finasteride (PROSCAR) 5 MG tablet TAKE 1 TABLET BY MOUTH EVERY DAY   furosemide (LASIX) 20 MG tablet Take 1 tablet (20 mg total) by mouth every other day   levothyroxine (SYNTHROID) 150 MCG tablet Take 1 tablet (150 mcg total) by mouth daily before breakfast. Except take 1/2 tablet on Sundays.  Total of 6.5 tablets in 1 week.   Multiple Vitamin (MULTIVITAMIN WITH MINERALS) TABS tablet Take 1 tablet by mouth daily. Senior Multivitamin   polyethylene glycol (MIRALAX) 17 g packet Take 17 g by mouth 2 (two) times daily.  ramipril (ALTACE) 10 MG capsule TAKE 1 CAPSULE BY MOUTH DAILY GENERIC EQUIVALENT FOR ALTACE   Vitamins/Minerals TABS Take 1 tablet by mouth daily.   No facility-administered encounter medications on file as of 08/17/2021.    Allergies (verified) Sulfonamide derivatives   History: Past Medical History:  Diagnosis Date   (HFpEF) heart failure with  preserved ejection fraction ([place])    a. 05/2018 Echo: EF 55-60%, gr2 DD.   Acute lower GI bleeding after colonoscopy and polypectomy, resolved 12/12/2015   Ascending aortic aneurysm    a. 02/2018 Echo: mildly dil Ao root/asc ao/arch; b. 05/2018 CTA Chest: 4.7cm fusiform aneurysm of Asc Ao.   CAD (coronary artery disease)    a. 05/2018 Cath/PCI: LM nl,LAD 30p, 90/99d/apical, LCX 53m OM1/2/3 min irregs, RCA 85p (3.5x18 SJennings.   Cardiac murmur    a. 02/2018 Echo: EF 60-65%, no rwma, mild AI, mildly dil Ao root/Asc Ao/Arch, Mild MR, mildly dil LA. Nl RV fxn.   Cataract    bilateral surgery to remove   CHF (congestive heart failure) (HCC)    Colon polyp    Constipation    Essential hypertension    Hemorrhoids    Hepatitis    self resolved, likely food exposure, 1974.    History of cardiovascular stress test    a. 02/2018 Myoview: EF 55-65%, small/mild apical defect w/ nl wall motion ->attenuation artifact. No ischemia. Low risk study.   Hx of adenomatous colonic polyps 12/05/2015   Hyperlipidemia    Hypothyroidism    Mitral regurgitation    a. 110/2019 Echo: EF 55-60%. Gr2 DD. Triv AI. Ao root 363m Mild MR. Mod dil LA, mildly dil RA.   Osteoarthritis    Persistent atrial fibrillation ([place]   a. Dx 02/2018. CHA2DS2VASc = 2-->Eliquis; b. 03/2018 DCCV (200J); c. 03/2018 recurrent Afib-->flecainide started 04/2018;  d. 05/06/2018 s/p successful DCCV - 200J x 2-->on amio.   Skin cancer    basal cell, R ear, MOHS   Past Surgical History:  Procedure Laterality Date   BROW LIFT Bilateral 10/23/2016   Procedure: BLEPHAROPLASTY upper eyelid with excess skin;  Surgeon: AmKarle StarchMD;  [facility] Service: Ophthalmology;  Laterality: Bilateral;  MAC   CARDIOVERSION N/A 03/18/2018   Procedure: CARDIOVERSION;  Surgeon: EnNelva BushMD;  [facility];  Service: Cardiovascular;  Laterality: N/A;   CARDIOVERSION N/A 05/06/2018   Procedure: CARDIOVERSION;  Surgeon: EnNelva BushMD;  Location[facility];  Service: Cardiovascular;  Laterality: N/A;   CATARACT EXTRACTION  1986   OD   CATARACT EXTRACTION Bilateral 1995   x 2 for right and left    COLONOSCOPY     CORONARY STENT INTERVENTION N/A 05/13/2018   Procedure: CORONARY STENT INTERVENTION;  Surgeon: EnNelva BushMD;  [place] [facility];  Service: Cardiovascular;  Laterality: N/A;   ECTROPION REPAIR Bilateral 10/23/2016   Procedure: REPAIR OF ECTROPION sutures, extensive;  Surgeon: AmKarle StarchMD;  [place] [facility] Service: Ophthalmology;  Laterality: Bilateral;   LIPOMA EXCISION  06/1997   left neck ([place])   MOHS SURGERY Right 2013   behind right ear   RIGHT/LEFT HEART CATH AND CORONARY ANGIOGRAPHY N/A 05/12/2018   Procedure: RIGHT/LEFT HEART CATH AND CORONARY ANGIOGRAPHY;  Surgeon: ArWellington HampshireMD;  [facility];  Service: Cardiovascular;  Laterality: N/A;   TONSILLECTOMY     VASECTOMY  1982   Family History  Problem Relation Age of Onset  Stroke Mother    Lung cancer Maternal Grandfather        smoker   Stroke Maternal Grandfather    Heart disease Paternal Grandfather        MI, old age   Colon cancer Neg Hx    Colon polyps Neg Hx    Esophageal cancer Neg Hx    Rectal cancer Neg Hx    Stomach cancer Neg Hx    Bladder Cancer Neg Hx    Prostate cancer Neg Hx    Social History   Socioeconomic History   Marital status: Married    Spouse name: Remo Lipps   Number of children: 1   Years of education: 14   Highest education level: Associate degree: occupational, Hotel manager, or vocational program  Occupational History   Occupation: Retired    Fish farm manager: RETIRED    Comment: 1  Tobacco Use   Smoking status: Former    Packs/day: 2.00    Years: 20.00    Pack years: 40.00    Types: Cigarettes    Quit date: 08/07/1983    Years since quitting: 38.0   Smokeless tobacco: Never   Tobacco comments:    quit over 40 years ago  Vaping Use    Vaping Use: Never used  Substance and Sexual Activity   Alcohol use: Yes    Alcohol/week: 1.0 standard drink    Types: 1 Shots of liquor per week    Comment: 1 at night before bed   Drug use: No   Sexual activity: Yes    Partners: Female  Other Topics Concern   Not on file  Social History Narrative   Married and lives with wife 1974   One daughter, not local   Retired from [employer], sea based Holiday representative   Social Determinants of Radio broadcast assistant Strain: Low Risk    Difficulty of Paying Living Expenses: Not hard at all  Food Insecurity: No Food Insecurity   Worried About Charity fundraiser in the Last Year: Never true   Arboriculturist in the Last Year: Never true  Transportation Needs: No Transportation Needs   Lack of Transportation (Medical): No   Lack of Transportation (Non-Medical): No  Physical Activity: Insufficiently Active   Days of Exercise per Week: 7 days   Minutes of Exercise per Session: 10 min  Stress: No Stress Concern Present   Feeling of Stress : Not at all  Social Connections: Moderately Integrated   Frequency of Communication with Friends and Family: Three times a week   Frequency of Social Gatherings with Friends and Family: Once a week   Attends Religious Services: Never   Marine scientist or Organizations: Yes   Attends Archivist Meetings: Never   Marital Status: Married    Tobacco Counseling Counseling given: Not Answered Tobacco comments: quit over 40 years ago   Clinical Intake:  Pre-visit preparation completed: Yes  Pain : No/denies pain     BMI - recorded: 34.58 Nutritional Status: BMI > 30  Obese Nutritional Risks: None Diabetes: No  How often do you need to have someone help you when you read instructions, pamphlets, or other written materials from your doctor or pharmacy?: 1 - Never  Diabetic? No  Interpreter Needed?: No  Information entered by :: Orrin Brigham  LPN   Activities of Daily Living In your present state of health, do you have any difficulty performing the following activities: 08/17/2021  Hearing? N  Vision? N  Difficulty concentrating or making decisions? N  Walking or climbing stairs? N  Comment avoids steps  Dressing or bathing? N  Doing errands, shopping? N  Preparing Food and eating ? N  Using the Toilet? N  In the past six months, have you accidently leaked urine? N  Do you have problems with loss of bowel control? N  Managing your Medications? N  Managing your Finances? N  Housekeeping or managing your Housekeeping? N  Some recent data might be hidden    Patient Care Team: Tonia Ghent, MD as PCP - General (Family Medicine) End, Harrell Gave, MD as PCP - Cardiology (Cardiology) Vickie Epley, MD as PCP - Electrophysiology (Cardiology)  Indicate any recent Medical Services you may have received from other than [facility] providers in the past year (date may be approximate).     Assessment:   This is a routine wellness examination for [facility].  Hearing/Vision screen Hearing Screening - Comments:: No issues Vision Screening - Comments:: Last exam 2022, [facility], wears glasses  Dietary issues and exercise activities discussed: Current Exercise Habits: Home exercise routine, Type of exercise: walking, Time (Minutes): 10 (Limited by hips and knees), Frequency (Times/Week): 7 (weather permiting), Weekly Exercise (Minutes/Week): 70, Intensity: Mild, Exercise limited by: orthopedic condition(s)   Goals Addressed             This Visit's Progress    Patient Stated       Would like to continue to exercise and be more active.       Depression Screen PHQ 2/9 Scores 08/17/2021 08/16/2020 08/11/2019 08/08/2018 05/22/2018 07/25/2017 07/23/2016  PHQ - 2 Score 0 0 0 0 0 0 0  PHQ- 9 Score - 0 0 0 - 0 -    Fall Risk Fall Risk  08/17/2021 08/16/2020 08/11/2019 05/07/2019 08/08/2018  Falls in the past year? 0 0 0 0 0   Number falls in past yr: 0 0 0 0 -  Injury with Fall? 0 0 0 0 -  Risk for fall due to : No Fall Risks Medication side effect Medication side effect - -  Follow up Falls prevention discussed Falls evaluation completed;Falls prevention discussed Falls evaluation completed;Falls prevention discussed - -    FALL RISK PREVENTION PERTAINING TO THE HOME:  Any stairs in or around the home? No  If so, are there any without handrails? No  Home free of loose throw rugs in walkways, pet beds, electrical cords, etc? Yes  Adequate lighting in your home to reduce risk of falls? Yes   ASSISTIVE DEVICES UTILIZED TO PREVENT FALLS:  Life alert? No  Use of a cane, walker or w/c? No  Grab bars in the bathroom? No  Shower chair or bench in shower? Yes  Elevated toilet seat or a handicapped toilet? Yes   TIMED UP AND GO:  Was the test performed? No .    Cognitive Function: Normal cognitive status assessed by  this Nurse Health Advisor. No abnormalities found.   MMSE - Mini Mental State Exam 08/16/2020 08/11/2019 08/08/2018 07/25/2017  Orientation to time _0 Orientation to Place _1 Registration _2 Attention/ Calculation 5 5 0 0  Recall _3 Language- name 2 objects - - 0 0  Language- repeat _4 Language- follow 3 step command - - 3 3  Language- read & follow direction - - 0 0  Write a sentence - -  0 0  Copy design - - 0 0  Total score - - 20 20        Immunizations Immunization History  Administered Date(s) Administered   Fluad Quad(high Dose 65+) 05/07/2019   Influenza, High Dose Seasonal PF 04/14/2017   Influenza,inj,Quad PF,6+ Mos 04/16/2018   Influenza-Unspecified 05/06/2013, 04/20/2014, 05/07/2015, 04/06/2016, 04/14/2017, 04/16/2018, 05/06/2020   PFIZER(Purple Top)SARS-COV-2 Vaccination 08/29/2019, 09/19/2019, 05/05/2020   Pfizer Covid-19 Vaccine Bivalent Booster 11yr & up 04/24/2021   Pneumococcal Conjugate-13 07/20/2014   Pneumococcal Polysaccharide-23  07/16/2013   Td 02/19/2008   Zoster Recombinat (Shingrix) 06/11/2019, 08/17/2019   Zoster, Live 09/30/2007    TDAP status: Due, Education has been provided regarding the importance of this vaccine. Advised may receive this vaccine at local pharmacy or Health Dept. Aware to provide a copy of the vaccination record if obtained from local pharmacy or Health Dept. Verbalized acceptance and understanding.  Flu Vaccine status: Up to date  Pneumococcal vaccine status: Up to date  Covid-19 vaccine status: Completed vaccines  Qualifies for Shingles Vaccine? Yes   Zostavax completed Yes   Shingrix Completed?: Yes  Screening Tests Health Maintenance  Topic Date Due   INFLUENZA VACCINE  03/06/2021   TETANUS/TDAP  08/08/2048 (Originally 02/18/2018)   COLONOSCOPY (Pts 45-450yrInsurance coverage will need to be confirmed)  10/19/2022   Pneumonia Vaccine 6520Years old  Completed   COVID-19 Vaccine  Completed   Hepatitis C Screening  Completed   Zoster Vaccines- Shingrix  Completed   HPV VACCINES  Aged Out    Health Maintenance  Health Maintenance Due  Topic Date Due   INFLUENZA VACCINE  03/06/2021    Colorectal cancer screening: Type of screening: Colonoscopy. Completed 10/19/19. Repeat every 3 years  Lung Cancer Screening: (Low Dose CT Chest recommended if Age 75-80ears, 30 pack-year currently smoking OR have quit w/in 15years.) does qualify. Patient will discuss CT scan with PCP.     Additional Screening:  Hepatitis C Screening: does qualify; Completed 10/20/15  Vision Screening: Recommended annual ophthalmology exams for early detection of glaucoma and other disorders of the eye. Is the patient up to date with their annual eye exam?  Yes  Who is the provider or what is the name of the office in which the patient attends annual eye exams[place]    Dental Screening: Recommended annual dental exams for proper oral hygiene  Community Resource  Referral / Chronic Care Management: CRR required this visit?  No   CCM required this visit?  No      Plan:     I have personally reviewed and noted the following in the patients chart:   Medical and social history Use of alcohol, tobacco or illicit drugs  Current medications and supplements including opioid prescriptions. Patient is not currently taking opioid prescriptions. Functional ability and status Nutritional status Physical activity Advanced directives List of other physicians Hospitalizations, surgeries, and ER visits in previous 12 months Vitals Screenings to include cognitive, depression, and falls Referrals and appointments  In addition, I have reviewed and discussed with patient certain preventive protocols, quality metrics, and best practice recommendations. A written personalized care plan for preventive services as well as general preventive health recommendations were provided to patient.   Due to this being a telephonic visit, the after visit summary with patients personalized plan was offered to patient via mail or my-chart. Patient would like to access on my-chart.     TaLoma MessingLPN  08/17/2021   Nurse Health Advisor  Nurse Notes: none

## 2021-08-17 ENCOUNTER — Ambulatory Visit (INDEPENDENT_AMBULATORY_CARE_PROVIDER_SITE_OTHER): Payer: Medicare Other

## 2021-08-17 VITALS — Ht 72.0 in | Wt 255.0 lb

## 2021-08-17 DIAGNOSIS — Z0000 Encounter for general adult medical examination without abnormal findings: Secondary | ICD-10-CM | POA: Diagnosis not present

## 2021-08-17 NOTE — Patient Instructions (Signed)
James [name] , Thank you for taking time to complete your Medicare Wellness Visit. I appreciate your ongoing commitment to your health goals. Please review the following plan we discussed and let me know if I can assist you in the future.   Screening recommendations/referrals: Colonoscopy: up to date, completed 10/19/19, due 10/19/22 Recommended yearly ophthalmology/optometry visit for glaucoma screening and checkup Recommended yearly dental visit for hygiene and checkup  Vaccinations: Influenza vaccine: up to date, please provide vaccine information for chart, when available Pneumococcal vaccine: up to date Tdap vaccine: Due last completed 02/19/08-May obtain vaccine at our office or your local pharmacy. Shingles vaccine: up to date   Covid-19: up to date  Advanced directives: copy on file  Conditions/risks identified: see problem list  Next appointment: Follow up in one year for your annual wellness visit. 08/20/22 @ 8:15am, this will be a telephone visit   Preventive Care 52 Years and Older, Male Preventive care refers to lifestyle choices and visits with your health care provider that can promote health and wellness. What does preventive care include? A yearly physical exam. This is also called an annual well check. Dental exams once or twice a year. Routine eye exams. Ask your health care provider how often you should have your eyes checked. Personal lifestyle choices, including: Daily care of your teeth and gums. Regular physical activity. Eating a healthy diet. Avoiding tobacco and drug use. Limiting alcohol use. Practicing safe sex. Taking low doses of aspirin every day. Taking vitamin and mineral supplements as recommended by your health care provider. What happens during an annual well check? The services and screenings done by your health care provider during your annual well check will depend on your age, overall health, lifestyle risk factors, and family history of  disease. Counseling  Your health care provider may ask you questions about your: Alcohol use. Tobacco use. Drug use. Emotional well-being. Home and relationship well-being. Sexual activity. Eating habits. History of falls. Memory and ability to understand (cognition). Work and work Statistician. Screening  You may have the following tests or measurements: Height, weight, and BMI. Blood pressure. Lipid and cholesterol levels. These may be checked every 5 years, or more frequently if you are over 109 years old. Skin check. Lung cancer screening. You may have this screening every year starting at age 75 if you have a 30-pack-year history of smoking and currently smoke or have quit within the past 15 years. Fecal occult blood test (FOBT) of the stool. You may have this test every year starting at age 75. Flexible sigmoidoscopy or colonoscopy. You may have a sigmoidoscopy every 5 years or a colonoscopy every 10 years starting at age 75. Prostate cancer screening. Recommendations will vary depending on your family history and other risks. Hepatitis C blood test. Hepatitis B blood test. Sexually transmitted disease (STD) testing. Diabetes screening. This is done by checking your blood sugar (glucose) after you have not eaten for a while (fasting). You may have this done every 1-3 years. Abdominal aortic aneurysm (AAA) screening. You may need this if you are a current or former smoker. Osteoporosis. You may be screened starting at age 75 if you are at high risk. Talk with your health care provider about your test results, treatment options, and if necessary, the need for more tests. Vaccines  Your health care provider may recommend certain vaccines, such as: Influenza vaccine. This is recommended every year. Tetanus, diphtheria, and acellular pertussis (Tdap, Td) vaccine. You may need a Td booster every  10 years. Zoster vaccine. You may need this after age 65. Pneumococcal 13-valent  conjugate (PCV13) vaccine. One dose is recommended after age 75 Pneumococcal polysaccharide (PPSV23) vaccine. One dose is recommended after age 75 Talk to your health care provider about which screenings and vaccines you need and how often you need them. This information is not intended to replace advice given to you by your health care provider. Make sure you discuss any questions you have with your health care provider. Document Released: 08/19/2015 Document Revised: 04/11/2016 Document Reviewed: 05/24/2015 Elsevier Interactive Patient Education  2017 [place] Prevention in the Home Falls can cause injuries. They can happen to people of all ages. There are many things you can do to make your home safe and to help prevent falls. What can I do on the outside of my home? Regularly fix the edges of walkways and driveways and fix any cracks. Remove anything that might make you trip as you walk through a door, such as a raised step or threshold. Trim any bushes or trees on the path to your home. Use bright outdoor lighting. Clear any walking paths of anything that might make someone trip, such as rocks or tools. Regularly check to see if handrails are loose or broken. Make sure that both sides of any steps have handrails. Any raised decks and porches should have guardrails on the edges. Have any leaves, snow, or ice cleared regularly. Use sand or salt on walking paths during winter. Clean up any spills in your garage right away. This includes oil or grease spills. What can I do in the bathroom? Use night lights. Install grab bars by the toilet and in the tub and shower. Do not use towel bars as grab bars. Use non-skid mats or decals in the tub or shower. If you need to sit down in the shower, use a plastic, non-slip stool. Keep the floor dry. Clean up any water that spills on the floor as soon as it happens. Remove soap buildup in the tub or shower regularly. Attach bath mats  securely with double-sided non-slip rug tape. Do not have throw rugs and other things on the floor that can make you trip. What can I do in the bedroom? Use night lights. Make sure that you have a light by your bed that is easy to reach. Do not use any sheets or blankets that are too big for your bed. They should not hang down onto the floor. Have a firm chair that has side arms. You can use this for support while you get dressed. Do not have throw rugs and other things on the floor that can make you trip. What can I do in the kitchen? Clean up any spills right away. Avoid walking on wet floors. Keep items that you use a lot in easy-to-reach places. If you need to reach something above you, use a strong step stool that has a grab bar. Keep electrical cords out of the way. Do not use floor polish or wax that makes floors slippery. If you must use wax, use non-skid floor wax. Do not have throw rugs and other things on the floor that can make you trip. What can I do with my stairs? Do not leave any items on the stairs. Make sure that there are handrails on both sides of the stairs and use them. Fix handrails that are broken or loose. Make sure that handrails are as long as the stairways. Check any carpeting to make  sure that it is firmly attached to the stairs. Fix any carpet that is loose or worn. Avoid having throw rugs at the top or bottom of the stairs. If you do have throw rugs, attach them to the floor with carpet tape. Make sure that you have a light switch at the top of the stairs and the bottom of the stairs. If you do not have them, ask someone to add them for you. What else can I do to help prevent falls? Wear shoes that: Do not have high heels. Have rubber bottoms. Are comfortable and fit you well. Are closed at the toe. Do not wear sandals. If you use a stepladder: Make sure that it is fully opened. Do not climb a closed stepladder. Make sure that both sides of the stepladder  are locked into place. Ask someone to hold it for you, if possible. Clearly mark and make sure that you can see: Any grab bars or handrails. First and last steps. Where the edge of each step is. Use tools that help you move around (mobility aids) if they are needed. These include: Canes. Walkers. Scooters. Crutches. Turn on the lights when you go into a dark area. Replace any light bulbs as soon as they burn out. Set up your furniture so you have a clear path. Avoid moving your furniture around. If any of your floors are uneven, fix them. If there are any pets around you, be aware of where they are. Review your medicines with your doctor. Some medicines can make you feel dizzy. This can increase your chance of falling. Ask your doctor what other things that you can do to help prevent falls. This information is not intended to replace advice given to you by your health care provider. Make sure you discuss any questions you have with your health care provider. Document Released: 05/19/2009 Document Revised: 12/29/2015 Document Reviewed: 08/27/2014 Elsevier Interactive Patient Education  2017 [employer].

## 2021-08-25 ENCOUNTER — Other Ambulatory Visit: Payer: Self-pay

## 2021-08-25 ENCOUNTER — Encounter: Payer: Self-pay | Admitting: Family Medicine

## 2021-08-25 ENCOUNTER — Ambulatory Visit (INDEPENDENT_AMBULATORY_CARE_PROVIDER_SITE_OTHER): Payer: Medicare Other | Admitting: Family Medicine

## 2021-08-25 VITALS — BP 122/72 | HR 75 | Temp 97.2°F | Ht 72.0 in | Wt 267.0 lb

## 2021-08-25 DIAGNOSIS — Z0000 Encounter for general adult medical examination without abnormal findings: Secondary | ICD-10-CM

## 2021-08-25 DIAGNOSIS — M25569 Pain in unspecified knee: Secondary | ICD-10-CM

## 2021-08-25 DIAGNOSIS — E7800 Pure hypercholesterolemia, unspecified: Secondary | ICD-10-CM

## 2021-08-25 DIAGNOSIS — R1310 Dysphagia, unspecified: Secondary | ICD-10-CM

## 2021-08-25 DIAGNOSIS — R739 Hyperglycemia, unspecified: Secondary | ICD-10-CM

## 2021-08-25 DIAGNOSIS — E039 Hypothyroidism, unspecified: Secondary | ICD-10-CM

## 2021-08-25 DIAGNOSIS — Z7189 Other specified counseling: Secondary | ICD-10-CM

## 2021-08-25 DIAGNOSIS — I10 Essential (primary) hypertension: Secondary | ICD-10-CM

## 2021-08-25 LAB — BASIC METABOLIC PANEL
BUN: 20 mg/dL (ref 6–23)
CO2: 28 mEq/L (ref 19–32)
Calcium: 9 mg/dL (ref 8.4–10.5)
Chloride: 107 mEq/L (ref 96–112)
Creatinine, Ser: 1.36 mg/dL (ref 0.40–1.50)
GFR: 51.42 mL/min — ABNORMAL LOW (ref >60.00)
Glucose, Bld: 87 mg/dL (ref 70–99)
Potassium: 4.5 mEq/L (ref 3.5–5.1)
Sodium: 139 mEq/L (ref 135–145)

## 2021-08-25 LAB — HEMOGLOBIN A1C: Hgb A1c MFr Bld: 5.1 % (ref 4.6–6.5)

## 2021-08-25 NOTE — Patient Instructions (Addendum)
Go to the lab on the way out.   If you have mychart we'll likely use that to update you.    Take care.  Glad to see you. We'll call about seeing GI.

## 2021-08-25 NOTE — Progress Notes (Signed)
This visit occurred during the SARS-CoV-2 public health emergency.  Safety protocols were in place, including screening questions prior to the visit, additional usage of staff PPE, and extensive cleaning of exam room while observing appropriate contact time as indicated for disinfecting solutions.  Shingrix 2021 Flu 2022 Tetanus 2009. PNA up to date.  covid vaccine 2022 Prostate cancer screening and PSA options (with potential risks and benefits of testing vs not testing) were discussed along with recent recs/guidelines.  He declined testing PSA at this point. Colonoscopy 2021 Wife would be designated if patient were incapacitated.     Diet and exercise d/w pt.  Exercise limited by knee and hip pain.  he had prev injection per ortho with transient relief.  He is putting up with the knee pain and considering options about his hip.    He is putting up with BPH sx. Still on med tx.  He has seen urology.  I'll defer for now.  He can update me as needed.  Elevated Cholesterol: Using medications without problems:yes Muscle aches:  not from statin  Diet compliance: yes Exercise: limited by pain, see above.   Prev labs at goal.    H/o mild sugar elevations prev- wasn't fasting.  Due for f/u labs.  See notes on labs.  Hypertension:    Using medication without problems or lightheadedness: yes Chest pain with exertion: no Edema: no Short of breath: no Prev labs d/w pt.  Cr 1.38 in 07/2021.    Hypothyroidism.   TSH wnl 05/2021.  Still on levothyroxine.  Compliant.    Dysphagia noted by patient.  He can feel something in the midst of swallowing.  Food doesn't get stuck but he can feel it with a swallow, at the level of the clavicles.  No airway sx.  Can happen with swallowing food or liquid or saliva.  No mass felt by patient.    He had covid in 07/2021.  He had relatively mild sx.  Sx resolved.    H/o ascending thoracic aortic diameter followed by cardiology.   He has A fib f/u pending.   Still anticoagulated, no bleeding.    Meds, vitals, and allergies reviewed.   PMH and SH reviewed  ROS: Per HPI unless specifically indicated in ROS section   GEN: nad, alert and oriented HEENT: ncat NECK: supple w/o LA CV: rrr. PULM: ctab, no inc wob ABD: soft, +bs EXT: no edema SKIN: well perfused.

## 2021-08-27 DIAGNOSIS — R739 Hyperglycemia, unspecified: Secondary | ICD-10-CM | POA: Insufficient documentation

## 2021-08-27 DIAGNOSIS — R1310 Dysphagia, unspecified: Secondary | ICD-10-CM | POA: Insufficient documentation

## 2021-08-27 NOTE — Assessment & Plan Note (Signed)
Refer to GI.  Discussed.  He agrees with plan.  Okay for outpatient follow-up.

## 2021-08-27 NOTE — Assessment & Plan Note (Signed)
Shingrix 2021 Flu 2022 Tetanus 2009. PNA up to date.  covid vaccine 2022 Prostate cancer screening and PSA options (with potential risks and benefits of testing vs not testing) were discussed along with recent recs/guidelines.  He declined testing PSA at this point. Colonoscopy 2021 Wife would be designated if patient were incapacitated.

## 2021-08-27 NOTE — Assessment & Plan Note (Signed)
Exercise limited by knee and hip pain.  he had prev injection per ortho with transient relief.  He is putting up with the knee pain and considering options about his hip.   I will defer.  He agrees.

## 2021-08-27 NOTE — Assessment & Plan Note (Signed)
TSH wnl 05/2021.  Still on levothyroxine.  Compliant.   Continue as is with levothyroxine.

## 2021-08-27 NOTE — Assessment & Plan Note (Signed)
Continue atorvastatin and Zetia. 

## 2021-08-27 NOTE — Assessment & Plan Note (Signed)
Continue ramipril Lasix and doxazosin as is.

## 2021-08-27 NOTE — Assessment & Plan Note (Signed)
H/o mild sugar elevations prev- wasn't fasting.  Due for f/u labs.  See notes on labs.

## 2021-09-04 ENCOUNTER — Other Ambulatory Visit: Payer: Self-pay | Admitting: Internal Medicine

## 2021-09-18 ENCOUNTER — Other Ambulatory Visit: Payer: Self-pay | Admitting: Urology

## 2021-09-18 DIAGNOSIS — N138 Other obstructive and reflux uropathy: Secondary | ICD-10-CM

## 2021-09-18 DIAGNOSIS — N401 Enlarged prostate with lower urinary tract symptoms: Secondary | ICD-10-CM

## 2021-09-18 NOTE — Progress Notes (Signed)
Surgical Physician Order Form [facility] [place]  * Scheduling expectation : Next Available  *Length of Case: 30 minutes  *Clearance needed: no  *Anticoagulation Instructions: Hold all anticoagulants  *Aspirin Instructions: Okay to continue baby aspirin  *Post-op visit Date/Instructions: 4 weeks with MD for PVR  *Diagnosis: BPH w/urinary obstruction  *Procedure: urolift   Additional orders: N/A  -Admit type: OUTpatient  -Anesthesia: General  -VTE Prophylaxis Standing Order SCDs       Other:   -Standing Lab Orders Per Anesthesia    Lab other: UA&Urine Culture  -Standing Test orders EKG/Chest x-ray per Anesthesia       Test other:   - Medications:  Ancef 2gm IV  -Other orders:  N/A

## 2021-09-21 ENCOUNTER — Telehealth: Payer: Self-pay

## 2021-09-21 DIAGNOSIS — H26491 Other secondary cataract, right eye: Secondary | ICD-10-CM | POA: Diagnosis not present

## 2021-09-21 NOTE — Progress Notes (Signed)
[facility] Posting Form   Surgery Date/Time: Date: 10/06/2021  Surgeon: Dr. Nickolas Madrid, MD  Surgery Location: Day Surgery  Inpt ( No  )   Outpt (Yes)   Obs ( No  )   Diagnosis: BPH with Urinary Obstruction  -CPT: 30104,04591  Surgery: Cystoscopy with insertion of Urolift  Stop Anticoagulations: Yes, may continue ASA  Cardiac/Medical/Pulmonary Clearance needed: no  *Orders entered into EPIC  Date: 09/21/21   *Case booked in EPIC  Date: 09/20/2021  *Notified pt of Surgery: Date: 09/19/2021  PRE-OP UA & CX: Yes, will obtain on 09/22/2021 in office  *Placed into Prior Authorization Work Fabio Bering Date: 09/21/21   Assistant/laser/rep:No

## 2021-09-21 NOTE — Telephone Encounter (Signed)
I spoke with James [name]. We have discussed possible surgery dates and Friday March 3rd, 2023 was agreed upon by all parties. Patient given information about surgery date, what to expect pre-operatively and post operatively.   We discussed that a Pre-Admission Testing office will be calling to set up the pre-op visit that will take place prior to surgery, and that these appointments are typically done over the phone with a Pre-Admissions RN.   Informed patient that our office will communicate any additional care to be provided after surgery. Patients questions or concerns were discussed during our call. Advised to call our office should there be any additional information, questions or concerns that arise. Patient verbalized understanding.

## 2021-09-22 ENCOUNTER — Other Ambulatory Visit: Payer: Self-pay

## 2021-09-22 ENCOUNTER — Other Ambulatory Visit: Payer: Medicare Other

## 2021-09-22 DIAGNOSIS — N401 Enlarged prostate with lower urinary tract symptoms: Secondary | ICD-10-CM

## 2021-09-22 DIAGNOSIS — N138 Other obstructive and reflux uropathy: Secondary | ICD-10-CM | POA: Diagnosis not present

## 2021-09-22 LAB — URINALYSIS, COMPLETE
Bilirubin, UA: NEGATIVE
Glucose, UA: NEGATIVE
Ketones, UA: NEGATIVE
Leukocytes,UA: NEGATIVE
Nitrite, UA: NEGATIVE
Protein,UA: NEGATIVE
Specific Gravity, UA: 1.025 (ref 1.005–1.030)
Urobilinogen, Ur: 0.2 mg/dL (ref 0.2–1.0)
pH, UA: 6.5 (ref 5.0–7.5)

## 2021-09-22 LAB — MICROSCOPIC EXAMINATION: Bacteria, UA: NONE SEEN

## 2021-09-25 ENCOUNTER — Encounter: Payer: Self-pay | Admitting: Family Medicine

## 2021-09-28 ENCOUNTER — Ambulatory Visit (HOSPITAL_COMMUNITY)
Admission: RE | Admit: 2021-09-28 | Discharge: 2021-09-28 | Disposition: A | Discharge status: Home / Self Care | Payer: Medicare Other | Source: Ambulatory Visit | Attending: Nurse Practitioner | Admitting: Nurse Practitioner

## 2021-09-28 ENCOUNTER — Other Ambulatory Visit: Payer: Self-pay

## 2021-09-28 ENCOUNTER — Encounter (HOSPITAL_COMMUNITY): Payer: Self-pay | Admitting: Nurse Practitioner

## 2021-09-28 VITALS — BP 160/80 | HR 72 | Ht 72.0 in | Wt 268.2 lb

## 2021-09-28 DIAGNOSIS — I5032 Chronic diastolic (congestive) heart failure: Secondary | ICD-10-CM | POA: Insufficient documentation

## 2021-09-28 DIAGNOSIS — I110 Hypertensive heart disease with heart failure: Secondary | ICD-10-CM | POA: Diagnosis not present

## 2021-09-28 DIAGNOSIS — I4891 Unspecified atrial fibrillation: Secondary | ICD-10-CM | POA: Diagnosis not present

## 2021-09-28 DIAGNOSIS — Z7901 Long term (current) use of anticoagulants: Secondary | ICD-10-CM | POA: Diagnosis not present

## 2021-09-28 DIAGNOSIS — D6869 Other thrombophilia: Secondary | ICD-10-CM

## 2021-09-28 DIAGNOSIS — I4819 Other persistent atrial fibrillation: Secondary | ICD-10-CM | POA: Diagnosis not present

## 2021-09-28 DIAGNOSIS — I2510 Atherosclerotic heart disease of native coronary artery without angina pectoris: Secondary | ICD-10-CM | POA: Insufficient documentation

## 2021-09-28 LAB — BASIC METABOLIC PANEL
Anion gap: 7 (ref 5–15)
BUN: 19 mg/dL (ref 8–23)
CO2: 25 mmol/L (ref 22–32)
Calcium: 9 mg/dL (ref 8.9–10.3)
Chloride: 107 mmol/L (ref 98–111)
Creatinine, Ser: 1.25 mg/dL — ABNORMAL HIGH (ref 0.61–1.24)
GFR, Estimated: >60 mL/min (ref >60)
Glucose, Bld: 90 mg/dL (ref 70–99)
Potassium: 4.7 mmol/L (ref 3.5–5.1)
Sodium: 139 mmol/L (ref 135–145)

## 2021-09-28 LAB — CULTURE, URINE COMPREHENSIVE

## 2021-09-28 LAB — MAGNESIUM: Magnesium: 2.1 mg/dL (ref 1.7–2.4)

## 2021-09-28 NOTE — Progress Notes (Signed)
Primary Care Physician: Tonia Ghent, MD Referring Physician:Dr. Filimon Miranda is a 76 y.o. male with a h/o HTN, CHF, CAD that saw Dr. Quentin Ore  last November as he wanted to come off amiodarone for potential side effects and wanted other options to maintain SR. Dr. Quentin Ore did not feel he was a good ablation candidate and felt Tikosyn was the best   option,  Now after allowing some time for amiodarone washout to permit loading of Tikosyn, he is here today to discuss Tikosyn admit. Amiodarone level done today.   Today, he denies symptoms of palpitations, chest pain, shortness of breath, orthopnea, PND, lower extremity edema, dizziness, presyncope, syncope, or neurologic sequela. The patient is tolerating medications without difficulties and is otherwise without complaint today.   Past Medical History:  Diagnosis Date   (HFpEF) heart failure with preserved ejection fraction ([place])    a. 05/2018 Echo: EF 55-60%, gr2 DD.   Acute lower GI bleeding after colonoscopy and polypectomy, resolved 12/12/2015   Ascending aortic aneurysm    a. 02/2018 Echo: mildly dil Ao root/asc ao/arch; b. 05/2018 CTA Chest: 4.7cm fusiform aneurysm of Asc Ao.   CAD (coronary artery disease)    a. 05/2018 Cath/PCI: LM nl,LAD 30p, 90/99d/apical, LCX 55m OM1/2/3 min irregs, RCA 85p (3.[place]x18 [place].   Cardiac murmur    a. 02/2018 Echo: EF 60-65%, no rwma, mild AI, mildly dil Ao root/Asc Ao/Arch, Mild MR, mildly dil LA. Nl RV fxn.   Cataract    bilateral surgery to remove   CHF (congestive heart failure) (HCC)    Colon polyp    Constipation    Essential hypertension    Hemorrhoids    Hepatitis    self resolved, likely food exposure, 1974.    History of cardiovascular stress test    a. 02/2018 Myoview: EF 55-65%, small/mild apical defect w/ nl wall motion ->attenuation artifact. No ischemia. Low risk study.   Hx of adenomatous colonic polyps 12/05/2015   Hyperlipidemia    Hypothyroidism    Mitral  regurgitation    a. 110/2019 Echo: EF 55-60%. Gr2 DD. Triv AI. Ao root 343m Mild MR. Mod dil LA, mildly dil RA.   Osteoarthritis    Persistent atrial fibrillation ([facility]   a. Dx 02/2018. CHA2DS2VASc = 2-->Eliquis; b. 03/2018 DCCV (200J); c. 03/2018 recurrent Afib-->flecainide started 04/2018;  d. 05/06/2018 s/p successful DCCV - 200J x 2-->on amio.   Skin cancer    basal cell, R ear, MOHS   Past Surgical History:  Procedure Laterality Date   BROW LIFT Bilateral 10/23/2016   Procedure: BLEPHAROPLASTY upper eyelid with excess skin;  Surgeon: AmKarle StarchMD;  [facility] Service: Ophthalmology;  Laterality: Bilateral;  MAC   CARDIOVERSION N/A 03/18/2018   Procedure: CARDIOVERSION;  Surgeon: EnNelva BushMD;  [place] [facility];  Service: Cardiovascular;  Laterality: N/A;   CARDIOVERSION N/A 05/06/2018   Procedure: CARDIOVERSION;  Surgeon: EnNelva BushMD;  [facility];  Service: Cardiovascular;  Laterality: N/A;   CATARACT EXTRACTION  1986   OD   CATARACT EXTRACTION Bilateral 1995   x 2 for right and left    COLONOSCOPY     CORONARY STENT INTERVENTION N/A 05/13/2018   Procedure: CORONARY STENT INTERVENTION;  Surgeon: EnNelva BushMD;  [facility];  Service: Cardiovascular;  Laterality: N/A;   ECTROPION REPAIR Bilateral 10/23/2016   Procedure: REPAIR OF ECTROPION sutures, extensive;  Surgeon: AmKarle StarchMD;  Location: [place];  Service: Ophthalmology;  Laterality: Bilateral;   LIPOMA EXCISION  06/1997   left neck ([place])   MOHS SURGERY Right 2013   behind right ear   RIGHT/LEFT HEART CATH AND CORONARY ANGIOGRAPHY N/A 05/12/2018   Procedure: RIGHT/LEFT HEART CATH AND CORONARY ANGIOGRAPHY;  Surgeon: Wellington Hampshire, MD;  Location: [facility];  Service: Cardiovascular;  Laterality: N/A;   TONSILLECTOMY     VASECTOMY  1982    Current Outpatient Medications  Medication Sig Dispense Refill   apixaban  (ELIQUIS) 5 MG TABS tablet TAKE 1 TABLET(5 MG) BY MOUTH TWICE DAILY 180 tablet 1   atorvastatin (LIPITOR) 80 MG tablet TAKE 1 TABLET BY MOUTH DAILY AT 6 PM 90 tablet 0   diclofenac Sodium (VOLTAREN) 1 % GEL Apply 2 g topically 4 (four) times daily as needed.     doxazosin (CARDURA) 4 MG tablet TAKE 1 TABLET BY MOUTH DAILY AFTER BREAKFAST. 90 tablet 0   ezetimibe (ZETIA) 10 MG tablet TAKE 1 TABLET BY MOUTH DAILY. 90 tablet 3   finasteride (PROSCAR) 5 MG tablet TAKE 1 TABLET BY MOUTH EVERY DAY 90 tablet 3   furosemide (LASIX) 20 MG tablet Take 1 tablet (20 mg total) by mouth every other day 45 tablet 1   ibuprofen (ADVIL) 200 MG tablet Take 400 mg by mouth every 6 (six) hours as needed for moderate pain.     levothyroxine (SYNTHROID) 150 MCG tablet Take 1 tablet (150 mcg total) by mouth daily before breakfast. Except take 1/2 tablet on Sundays.  Total of 6.5 tablets in 1 week. 100 tablet 1   Multiple Vitamin (MULTIVITAMIN WITH MINERALS) TABS tablet Take 1 tablet by mouth daily. Senior Multivitamin     polyethylene glycol (MIRALAX) 17 g packet Take 17 g by mouth 2 (two) times daily. (Patient taking differently: Take 17 g by mouth daily as needed for moderate constipation.) 14 each 0   ramipril (ALTACE) 10 MG capsule TAKE 1 CAPSULE BY MOUTH DAILY GENERIC EQUIVALENT FOR ALTACE 90 capsule 3   White Petrolatum-Mineral Oil (REFRESH P.M. OP) Place 1 drop into both eyes at bedtime as needed (dry eyes).     No current facility-administered medications for this encounter.    Allergies  Allergen Reactions   Sulfonamide Derivatives Rash    Social History   Socioeconomic History   Marital status: Married    Spouse name: Remo Lipps   Number of children: 1   Years of education: 14   Highest education level: Associate degree: occupational, Hotel manager, or vocational program  Occupational History   Occupation: Retired    Fish farm manager: RETIRED    Comment: 1  Tobacco Use   Smoking status: Former    Packs/day:  2.00    Years: 20.00    Pack years: 40.00    Types: Cigarettes    Quit date: 08/07/1983    Years since quitting: 38.1   Smokeless tobacco: Never   Tobacco comments:    quit over 40 years ago  Vaping Use   Vaping Use: Never used  Substance and Sexual Activity   Alcohol use: Yes    Alcohol/week: 7.0 standard drinks    Types: 7 Shots of liquor per week    Comment: 1 at night before bed 09/28/21   Drug use: No   Sexual activity: Yes    Partners: Female  Other Topics Concern   Not on file  Social History Narrative   Married and lives with wife 1974  One daughter, not local   Retired from [employer], sea based telecommunication/contracting   Social Determinants of Radio broadcast assistant Strain: Low Risk    Difficulty of Paying Living Expenses: Not hard at all  Food Insecurity: No Food Insecurity   Worried About Charity fundraiser in the Last Year: Never true   Arboriculturist in the Last Year: Never true  Transportation Needs: No Transportation Needs   Lack of Transportation (Medical): No   Lack of Transportation (Non-Medical): No  Physical Activity: Insufficiently Active   Days of Exercise per Week: 7 days   Minutes of Exercise per Session: 10 min  Stress: No Stress Concern Present   Feeling of Stress : Not at all  Social Connections: Moderately Integrated   Frequency of Communication with Friends and Family: Three times a week   Frequency of Social Gatherings with Friends and Family: Once a week   Attends Religious Services: Never   Marine scientist or Organizations: Yes   Attends Archivist Meetings: Never   Marital Status: Married  Human resources officer Violence: Not At Risk   Fear of Current or Ex-Partner: No   Emotionally Abused: No   Physically Abused: No   Sexually Abused: No    Family History  Problem Relation Age of Onset   Stroke Mother    Lung cancer Maternal Grandfather        smoker   Stroke Maternal Grandfather    Heart disease Paternal  Grandfather        MI, old age   Colon cancer Neg Hx    Colon polyps Neg Hx    Esophageal cancer Neg Hx    Rectal cancer Neg Hx    Stomach cancer Neg Hx    Bladder Cancer Neg Hx    Prostate cancer Neg Hx     ROS- All systems are reviewed and negative except as per the HPI above  Physical Exam: Vitals:   09/28/21 0840  BP: (!) 160/80  Pulse: 72  Weight: 121.7 kg  Height: 6' (1.829 m)   Wt Readings from Last 3 Encounters:  09/28/21 121.7 kg  08/25/21 121.1 kg  08/17/21 115.7 kg    Labs: Lab Results  Component Value Date   NA 139 09/28/2021   K 4.7 09/28/2021   CL 107 09/28/2021   CO2 25 09/28/2021   GLUCOSE 90 09/28/2021   BUN 19 09/28/2021   CREATININE 1.25 (H) 09/28/2021   CALCIUM 9.0 09/28/2021   MG 2.1 09/28/2021   Lab Results  Component Value Date   INR 1.31 05/09/2018   Lab Results  Component Value Date   CHOL 126 05/25/2021   HDL 48 05/25/2021   LDLCALC 63 05/25/2021   TRIG 76 05/25/2021     GEN- The patient is well appearing, alert and oriented x 3 today.   Head- normocephalic, atraumatic Eyes-  Sclera clear, conjunctiva pink Ears- hearing intact Oropharynx- clear Neck- supple, no JVP Lymph- no cervical lymphadenopathy Lungs- Clear to ausculation bilaterally, normal work of breathing Heart- irregular rate and rhythm, no murmurs, rubs or gallops, PMI not laterally displaced GI- soft, NT, ND, + BS Extremities- no clubbing, cyanosis, or edema MS- no significant deformity or atrophy Skin- no rash or lesion Psych- euthymic mood, full affect Neuro- strength and sensation are intact  EKG-PR interval 186 ms QRS duration 92 ms QT/QTcB 396/433 ms P-R-T axes 57 1 46 Normal sinus rhythm Cannot rule out Inferior infarct , age undetermined  Abnormal ECG When compared with ECG of 08-Jul-2021 08:52,    Assessment and Plan:  1. Afib Pt wanted  to come off amiodarone for fear of potential side effects  He is not optimal; ablation candidate so he  is here today to discuss Tikosyn admit Qtc is acceptable at 433 ms No benadryl use Discussed cost of drug  Amio has been washing out since early November, amio level today  He has been maintaining   SR  Will scheduled for mid March as he will be off 4 months by then  Risk vrs benefit of drug discussed  Bmet/mag today for baseline He has Tikosyn instruction sheet for admission  Will send drug for review by PharmD  2. CHA2DS2VASc  score of 4 Continue eliquis 5 mg bid  States no missed  doses x at least 3 weeks   He will be seen back 3/27 for tikosyn admit   Butch Penny C. Linetta Regner, [facility] [address] [place], [place] [address] [phone]

## 2021-09-29 ENCOUNTER — Telehealth: Payer: Self-pay | Admitting: Pharmacist

## 2021-09-29 NOTE — Telephone Encounter (Signed)
Medication list reviewed in anticipation of upcoming Tikosyn initiation. Patient is not taking any contraindicated or QTc prolonging medications.   Patient is anticoagulated on Eliquis on the appropriate dose. Please ensure that patient has not missed any anticoagulation doses in the 3 weeks prior to Tikosyn initiation.   Patient will need to be counseled to avoid use of Benadryl while on Tikosyn and in the 2-3 days prior to Tikosyn initiation.  Please ensure potassium is at least 4 and magnesium is at least 2 prior to starting Tikosyn. Monitor closely during therapy since patient is on furosemide.

## 2021-10-02 ENCOUNTER — Encounter
Admission: RE | Admit: 2021-10-02 | Discharge: 2021-10-02 | Disposition: A | Discharge status: Home / Self Care | Payer: Medicare Other | Source: Ambulatory Visit | Attending: Urology | Admitting: Urology

## 2021-10-02 ENCOUNTER — Encounter (HOSPITAL_COMMUNITY): Payer: Self-pay | Admitting: Urgent Care

## 2021-10-02 ENCOUNTER — Telehealth: Payer: Self-pay | Admitting: *Deleted

## 2021-10-02 ENCOUNTER — Other Ambulatory Visit: Payer: Self-pay

## 2021-10-02 NOTE — Patient Instructions (Addendum)
Your procedure is scheduled on: Friday, March 3 Report to the Registration Desk on the 1st floor of the [facility]. To find out your arrival time, please call [phone] between 1PM - 3PM on: Thursday, March 2  REMEMBER: Instructions that are not followed completely may result in serious medical risk, up to and including death; or upon the discretion of your surgeon and anesthesiologist your surgery may need to be rescheduled.  Do not eat or drink after midnight the night before surgery.  No gum chewing, lozengers or hard candies.  TAKE THESE MEDICATIONS THE MORNING OF SURGERY WITH A SIP OF WATER:  Doxazosin Ezetimibe Finasteride Levothyroxine  Follow recommendations from Cardiologist or PCP regarding stopping Eliquis (apixaban). Usually stop 2 days prior to surgery. Last day to take is February 28. Resume AFTER surgery per surgeon instruction.  One week prior to surgery: Stop Anti-inflammatories (NSAIDS) such as Advil, Aleve, Ibuprofen, Motrin, Naproxen, Naprosyn and Aspirin based products such as Excedrin, Goodys Powder, BC Powder. Stop ANY OVER THE COUNTER supplements until after surgery. You may however, continue to take Tylenol if needed for pain up until the day of surgery.  No Alcohol for 24 hours before or after surgery.  No Smoking including e-cigarettes for 24 hours prior to surgery.  No chewable tobacco products for at least 6 hours prior to surgery.  No nicotine patches on the day of surgery.  Do not use any "recreational" drugs for at least a week prior to your surgery.  Please be advised that the combination of cocaine and anesthesia may have negative outcomes, up to and including death. If you test positive for cocaine, your surgery will be cancelled.  On the morning of surgery brush your teeth with toothpaste and water, you may rinse your mouth with mouthwash if you wish. Do not swallow any toothpaste or mouthwash.  Do not wear jewelry.  Do not wear  lotions, powders, or perfumes.   Do not shave body from the neck down 48 hours prior to surgery just in case you cut yourself which could leave a site for infection.   Contact lenses, hearing aids and dentures may not be worn into surgery.  Do not bring valuables to the hospital. [facility] is not responsible for any missing/lost belongings or valuables.   Notify your doctor if there is any change in your medical condition (cold, fever, infection).  Wear comfortable clothing (specific to your surgery type) to the hospital.  After surgery, you can help prevent lung complications by doing breathing exercises.  Take deep breaths and cough every 1-2 hours. Your doctor may order a device called an Incentive Spirometer to help you take deep breaths.  If you are being discharged the day of surgery, you will not be allowed to drive home. You will need a responsible adult (18 years or older) to drive you home and stay with you that night.   If you are taking public transportation, you will need to have a responsible adult (18 years or older) with you. Please confirm with your physician that it is acceptable to use public transportation.   Please call the [place] Dept. at [phone] if you have any questions about these instructions.  Surgery Visitation Policy:  Patients undergoing a surgery or procedure may have one family member or support person with them as long as that person is not COVID-19 positive or experiencing its symptoms.  That person may remain in the waiting area during the procedure and may rotate  out with other people.

## 2021-10-02 NOTE — Telephone Encounter (Signed)
° °  Primary Cardiologist: Nelva Bush, MD  Chart reviewed as part of pre-operative protocol coverage. Given past medical history and time since last visit, based on ACC/AHA guidelines, James [name] would be at acceptable risk for the planned procedure without further cardiovascular testing.   Patient with diagnosis of atrial fibrillation on Eliquis for anticoagulation.     Procedure: cystoscopy w/ insertion of urolift Date of procedure: 10/06/21     CHA2DS2-VASc Score = 4   This indicates a 4.8% annual risk of stroke. The patient's score is based upon: CHF History: 1 HTN History: 1 Diabetes History: 0 Stroke History: 0 Vascular Disease History: 1 Age Score: 1 Gender Score: 0     CrCl 70 Platelet count 136   Per office protocol, patient can hold Eliquis for 2 days prior to procedure and restart 2 days post-op..   Patient will not need bridging with Lovenox (enoxaparin) around procedure.   Patient has spoken with [facility] about starting Tikosyn.  If he wishes to do that, will have to start over in getting 4 weeks of anticoagulation prior to Tikosyn admit.  I will route this recommendation to the requesting party via Epic fax function and remove from pre-op pool.  Please call with questions.  Jossie Ng. Jenalee Trevizo NP-C    10/02/2021, 3:18 PM [place] [address] Office [phone] Fax [phone]

## 2021-10-02 NOTE — Telephone Encounter (Addendum)
Request for pre-operative cardiac clearance Received: Today Karen Kitchens, NP  P Cv Div Preop Callback Request for pre-operative cardiac clearance:     1. What type of surgery is being performed?  CYSTOSCOPY WITH INSERTION OF UROLIFT   2. When is this surgery scheduled?  10/06/2021     3. Type of clearance being requested (medical, pharmacy, both).  BOTH     4. Are there any medications that need to be held prior to surgery?  APIXABAN --- note that surgeon asking to hold 2 days prior and restart 2 days postop. However, patient scheduled to start Tikosyn and states that he was advised by cardiology that coming off of his apixaban would interfere with him starting his Tikosyn. Please advise.   5. Practice name and name of physician performing surgery?  Performing surgeon: Dr. Nickolas Madrid, MD  Requesting clearance: Honor Loh, FNP-C       6. Anesthesia type (none, local, MAC, general)?  GENERAL   7. What is the office phone and fax number?    Phone: [phone]  Fax: [phone]   ATTENTION: Unable to create telephone message as per your standard workflow. Directed by [facility] providers to send requests for cardiac clearance to this pool for appropriate distribution to provider covering pre-operative clearances.   Honor Loh, MSN, APRN, FNP-C, CEN  [facility]  Peri-operative Services Nurse Practitioner  Phone: [phone]  10/02/21 9:45 AM

## 2021-10-02 NOTE — Telephone Encounter (Signed)
Patient with diagnosis of atrial fibrillation on Eliquis for anticoagulation.    Procedure: cystoscopy w/ insertion of urolift Date of procedure: 10/06/21   CHA2DS2-VASc Score = 4   This indicates a 4.8% annual risk of stroke. The patient's score is based upon: CHF History: 1 HTN History: 1 Diabetes History: 0 Stroke History: 0 Vascular Disease History: 1 Age Score: 1 Gender Score: 0    CrCl 70 Platelet count 136  Per office protocol, patient can hold Eliquis for 2 days prior to procedure and restart 2 days post-op..   Patient will not need bridging with Lovenox (enoxaparin) around procedure.  Patient has spoken with [facility] about starting Tikosyn.  If he wishes to do that, will have to start over in getting 4 weeks of anticoagulation prior to Tikosyn admit.

## 2021-10-03 ENCOUNTER — Other Ambulatory Visit: Payer: Self-pay | Admitting: Family Medicine

## 2021-10-03 ENCOUNTER — Ambulatory Visit: Payer: Medicare Other | Admitting: Family Medicine

## 2021-10-03 ENCOUNTER — Telehealth: Payer: Self-pay

## 2021-10-03 VITALS — BP 138/80 | HR 67 | Temp 97.6°F | Ht 72.0 in | Wt 274.0 lb

## 2021-10-03 DIAGNOSIS — S299XXA Unspecified injury of thorax, initial encounter: Secondary | ICD-10-CM | POA: Diagnosis not present

## 2021-10-03 NOTE — Telephone Encounter (Signed)
Vera RN with access nurse said that pt leaned up hard against something and may have cracked a rib. Pt is not having any difficulty breathing and disposition is to be seen within 4 hrs. No available appts at [facility] and Vanita Ingles will advise pt to go to [facility] or ED. Will add access nurse note when available. Sending note to DR Damita Dunnings and Janett Billow CMA.

## 2021-10-03 NOTE — Assessment & Plan Note (Signed)
Given improvement in just 24 hours less likely fracture also limited trauma.  Discussed it could be muscle injury versus contusion versus fracture.  At this time breathing well without any respiratory issues no need for x-ray.  Discussed expected healing time for contusion versus fracture.  Follow-up if worsening.  Of note patient is a former smoker however he quit in the 80s and had a CT scan 4 months ago which showed no active lesions, but did discuss if the symptoms do not resolve within 8 to 12 weeks he should get an x-ray to evaluate the lesion.

## 2021-10-03 NOTE — Progress Notes (Signed)
Call to patient to explain the situation with Eliquis, Tikosyn and surgery. Patient understands and decided that he wants to continue taking the Eliquis to get started on Tikosyn. Will reschedule Urolift surgery when it is safe to do so per cardiologist.

## 2021-10-03 NOTE — Progress Notes (Signed)
Subjective:     James [name] is a 76 y.o. male presenting for Rib Injury (R side. Leaned over arm of chair and felt sharp pain. X 1 day )     HPI  #Rib pain - leaning on an arm chair - felt a sharp pain in his rib - happened last night - pain is fine at rest - when he moves certain directions he can feel it - worse with very deep breath - improving from this morning - treatment: took 2 ibuprofen this morning - but does take this regularly - Quit smoking 1985 - not hx of fractures - no recent prednisone use - hx of broken rib about 30-40 years ago - no cough, sob  Review of Systems   Social History   Tobacco Use  Smoking Status Former   Packs/day: 2.00   Years: 20.00   Pack years: 40.00   Types: Cigarettes   Quit date: 08/07/1983   Years since quitting: 38.1  Smokeless Tobacco Never  Tobacco Comments   quit over 40 years ago        Objective:    BP Readings from Last 3 Encounters:  10/03/21 138/80  09/28/21 (!) 160/80  08/25/21 122/72   Wt Readings from Last 3 Encounters:  10/03/21 274 lb (124.3 kg)  10/02/21 258 lb (117 kg)  09/28/21 268 lb 3.2 oz (121.7 kg)    BP 138/80    Pulse 67    Temp 97.6 F (36.4 C) (Oral)    Ht 6' (1.829 m)    Wt 274 lb (124.3 kg)    SpO2 99%    BMI 37.16 kg/m    Physical Exam Constitutional:      Appearance: Normal appearance. He is not ill-appearing or diaphoretic.  HENT:     Right Ear: External ear normal.     Left Ear: External ear normal.  Eyes:     General: No scleral icterus.    Extraocular Movements: Extraocular movements intact.     Conjunctiva/sclera: Conjunctivae normal.  Cardiovascular:     Rate and Rhythm: Normal rate and regular rhythm.  Pulmonary:     Effort: Pulmonary effort is normal. No respiratory distress.     Breath sounds: Normal breath sounds. No wheezing or rales.  Chest:     Chest wall: Tenderness present. No crepitus.    Musculoskeletal:     Cervical back: Neck supple.  Skin:     General: Skin is warm and dry.  Neurological:     Mental Status: He is alert. Mental status is at baseline.  Psychiatric:        Mood and Affect: Mood normal.          Assessment & Plan:   Problem List Items Addressed This Visit       Musculoskeletal and Integument   Rib injury - Primary    Given improvement in just 24 hours less likely fracture also limited trauma.  Discussed it could be muscle injury versus contusion versus fracture.  At this time breathing well without any respiratory issues no need for x-ray.  Discussed expected healing time for contusion versus fracture.  Follow-up if worsening.  Of note patient is a former smoker however he quit in the 80s and had a CT scan 4 months ago which showed no active lesions, but did discuss if the symptoms do not resolve within 8 to 12 weeks he should get an x-ray to evaluate the lesion.  Return if symptoms worsen or fail to improve.  Lesleigh Noe, MD  This visit occurred during the SARS-CoV-2 public health emergency.  Safety protocols were in place, including screening questions prior to the visit, additional usage of staff PPE, and extensive cleaning of exam room while observing appropriate contact time as indicated for disinfecting solutions.

## 2021-10-03 NOTE — Progress Notes (Addendum)
°  Perioperative Services Pre-Admission/Anesthesia Testing    Date: 10/03/21  Name: James [name] MRN:   [id number]  Re: Plans for surgery considering current apixaban therapy and plans for Tikosyn initiation  Planned Surgical Procedure(s):    Case: [id number] Date/Time: 10/06/21 6734   Procedure: CYSTOSCOPY WITH INSERTION OF UROLIFT   Anesthesia type: General   Pre-op diagnosis: Benign Prostatic Hyperplasia with Urinary Obstruction   Location: [facility] OR ROOM 10 / [facility]   Surgeons: Billey Co, MD   Clinical Notes:  Patient scheduled for the above procedure on 10/06/2021 with Dr. Nickolas Madrid, MD. with that being said, patient with a history of atrial fibrillation.  He has been off of antiarrhythmic (amiodarone) for 4 months with plans to start Tikosyn soon.  In order for Tikosyn therapy to be initiated, patient will need to be on 4 weeks of uninterrupted anticoagulation therapy.  Patient has been on apixaban therapy for approximately 3 weeks at this point.  His required 4 weeks of pre-Tikosyn therapy will be satisfied on Monday (10/09/2021.  Once Tikosyn is started, patient will have to be on an additional 4 weeks of uninterrupted apixaban before it would be safe for him to hold this medication for the proposed UroLift procedure.  Spoke with primary attending surgeon Diamantina Providence, MD) and presented available options for this patient's plan of care, which include:  Proceed with planned procedure with patient continuing his daily dose of apixaban.  Proceed with planned procedure while holding the apixaban therapy.  This will mean that patient will have to start his 4 weeks of pre-Tikosyn therapy over again.  Postpone procedure until after required admission for Tikosyn loading.  This will require at least another 1-1.5 months before the planned surgical procedure can be completed.  With that being said, this allows for patient to be restarted on antiarrhythmic therapy,  which in turn will reduce his chances of developing recurrent atrial fibrillation.  Per Dr. Diamantina Providence, "UroLift procedure is purely elective. Would recommend postponing UroLift procedure for 1 to 2 months after Tikosyn has been started".  Contacted surgery scheduler and advised of need to remove planned surgical case from the 10/06/2021 OR schedule.  PAT staff to contact patient to discuss recommendations from both cardiology and urology regarding plans to initiate antiarrhythmic therapy prior to proceeding with urological procedure.  Patient to be asked to contact surgery scheduler approximately 1 to 2 months following initiation of his Tikosyn so that procedure can be rescheduled. Appreciate input from our cardiology team Kathlen Mody, PA-C and [place], RPH-CPP), which helped to develop the best plan of care for this patient going forward.   Honor Loh, MSN, APRN, FNP-C, CEN [facility]  Peri-operative Services Nurse Practitioner Phone: [phone] 10/03/21 10:52 AM  NOTE: This note has been prepared using Dragon dictation software. Despite my best ability to proofread, there is always the potential that unintentional transcriptional errors may still occur from this process.

## 2021-10-03 NOTE — Patient Instructions (Signed)
Return for imaging re-evaluation if the following:  1) worsening pain 2) breathing difficulty  3) shortness of breath or cough 4) lack of noticeable improvement  If muscle or contusion - expect 4-6 weeks  If broken - could take 8-12 weeks

## 2021-10-03 NOTE — Telephone Encounter (Signed)
Noted. Thanks.

## 2021-10-03 NOTE — Telephone Encounter (Signed)
See note below access nurse note. Scheduled appt with Dr Einar Pheasant 10/03/21 at 10:40. UC & ED precautions given and pt voiced understanding. Sending note in response to Dr Damita Dunnings and Dr Einar Pheasant and  [place] CMA.   [place] [facility] - Client TELEPHONE ADVICE RECORD AccessNurse Patient Name: James [name] Gender: Male DOB: 28-Jun-1946 Age: 76 Y 2 M 9 D Return Phone Number: [phone] (Primary) Address: City/ State/ Zip: [place]  [address] Client [facility] - Client Client Site [place] - Day Provider Renford Dills - MD Contact Type Call Who Is Calling Patient / Member / Family / Caregiver Call Type Triage / Clinical Relationship To Patient Self Return Phone Number [phone] (Primary) Chief Complaint CHEST PAIN - pain, pressure, heaviness or tightness Reason for Call Symptomatic / Request for [place] states he has been experiencing chest pain and rib pain. Translation No Nurse Assessment Nurse: Raphael Gibney, RN, Vanita Ingles Date/Time (Eastern Time): 10/03/2021 8:38:32 AM Confirm and document reason for call. If symptomatic, describe symptoms. ---Caller states he has been having rib pain. He reached over to pick up something from the floor and pressed his right rib area hard. Pain level 5. Does the patient have any new or worsening symptoms? ---Yes Will a triage be completed? ---Yes Related visit to physician within the last 2 weeks? ---No Does the PT have any chronic conditions? (i.e. diabetes, asthma, this includes High risk factors for pregnancy, etc.) ---Yes List chronic conditions. ---A fib; HTN Is this a behavioral health or substance abuse call? ---No Guidelines Guideline Title Affirmed Question Affirmed Notes Nurse Date/Time (Eastern Time) Chest Injury [1] High-risk adult (e.g., age > 52 years, osteoporosis, chronic steroid use) AND [2] still hurts Raphael Gibney,  Therapist, sports, Vanita Ingles 10/03/2021 8:41:21 AM Disp. Time Eilene Ghazi Time) Disposition Final User 10/03/2021 8:36:21 AM Send to Urgent Paula Compton, [place] PLEASE NOTE: All timestamps contained within this report are represented as [place] Standard Time. CONFIDENTIALTY NOTICE: This fax transmission is intended only for the addressee. It contains information that is legally privileged, confidential or otherwise protected from use or disclosure. If you are not the intended recipient, you are strictly prohibited from reviewing, disclosing, copying using or disseminating any of this information or taking any action in reliance on or regarding this information. If you have received this fax in error, please notify us immediately by telephone so that we can arrange for its return to [place]. Phone: [phone], Toll-Free: [phone], Fax: [phone] Page: 2 of 2 Call Id: [id number] 10/03/2021 8:49:03 AM See HCP within 4 Hours (or PCP triage) Yes Raphael Gibney, RN, Vanita Ingles Disposition Overriden: See PCP within 24 Hours Override Reason: Patients symptoms need a higher level of care Caller Disagree/Comply Comply Caller Understands Yes PreDisposition Call Doctor Care Advice Given Per Guideline SEE HCP (OR PCP TRIAGE) WITHIN 4 HOURS: * IF OFFICE WILL BE OPEN: You need to be seen within the next 3 or 4 hours. Call your doctor (or NP/PA) now or as soon as the office opens. CALL BACK IF: * You become worse CARE ADVICE given per Chest Injury (Adult) guideline. Comments User: Dannielle Burn, RN Date/Time Eilene Ghazi Time): 10/03/2021 8:46:47 AM triage outcome upgraded to see physician within 4 hrs as pt is able to take a deep breath but has some pain. User: Dannielle Burn, RN Date/Time Eilene Ghazi Time): 10/03/2021 8:48:30 AM Called back line and spoke to Rina and gave report that pt leaned over to pick up something and  pressed his rib area hard and thinks he may have fractured a rib. No SOB. Triage outcome see physician within 4 hrs. no  appts available. Advised pt to go to urgent care. States he will Referrals GO TO FACILITY UNDECIDE

## 2021-10-03 NOTE — Telephone Encounter (Signed)
See note from today

## 2021-10-03 NOTE — Telephone Encounter (Signed)
If we can't add him on at 1pm, then rec UC.  Thanks.

## 2021-10-05 ENCOUNTER — Ambulatory Visit: Payer: Medicare Other | Admitting: Urology

## 2021-10-05 ENCOUNTER — Other Ambulatory Visit: Payer: Self-pay

## 2021-10-05 ENCOUNTER — Telehealth: Payer: Self-pay

## 2021-10-05 ENCOUNTER — Encounter: Payer: Self-pay | Admitting: Urology

## 2021-10-05 VITALS — BP 165/93 | HR 116 | Ht 73.0 in | Wt 274.0 lb

## 2021-10-05 DIAGNOSIS — N401 Enlarged prostate with lower urinary tract symptoms: Secondary | ICD-10-CM | POA: Diagnosis not present

## 2021-10-05 DIAGNOSIS — N138 Other obstructive and reflux uropathy: Secondary | ICD-10-CM

## 2021-10-05 DIAGNOSIS — R3129 Other microscopic hematuria: Secondary | ICD-10-CM | POA: Diagnosis not present

## 2021-10-05 LAB — BLADDER SCAN AMB NON-IMAGING

## 2021-10-05 LAB — AMIODARONE LEVEL
Amiodarone Lvl: 117 ng/mL — ABNORMAL LOW (ref 1000–2500)
N-Desethyl-Amiodarone: 128 ng/mL

## 2021-10-05 NOTE — Telephone Encounter (Signed)
Patient called in this morning. States that he has been having bladder pain throughout the night due to not being able to urinate. States that he woke up 4 times last night with very little output. Patient states that he would like to go ahead and get his Urolift procedure. I informed him your next available surgery day is 03/24. He states that he cannot wait this long. Patient states that he is miserable and would like to know if there is something else we can offer him until he can get the surgery done? ?

## 2021-10-05 NOTE — Telephone Encounter (Signed)
Spoke with pt. Added to clinic for this am.  ?

## 2021-10-05 NOTE — Telephone Encounter (Signed)
He is on anticoagulation so cannot add for urolift tomorrow. I can see him in clinic this AM for UA/PVR, and consider foley vs CIC if in retention. ? ?Nickolas Madrid, MD ?10/05/2021 ? ?

## 2021-10-05 NOTE — Progress Notes (Signed)
? ?  10/05/2021 ?11:24 AM  ? ?James [name] ?[birth year] ?[id number] ? ?Reason for visit: difficulty urinating ? ?HPI: ?76 year old male who I last saw in October 2022 for cystoscopy which showed an obstructive appearing prostate, and was interested in scheduling a UroLift for BPH.  He did not call until 2023 to schedule this, and was originally on the schedule for tomorrow, but has been anticoagulated with Eliquis with plan for admission for Tikosyn, and he opted to reschedule his UroLift in order to not interrupt his Eliquis and pending admission for Tikosyn. ? ?He called the clinic this morning with increased difficulty voiding and concern for possible retention, and he was added to my clinic schedule today.  He has been able to void small amounts, but denies any dysuria or gross hematuria.  He has some lower abdominal pressure that he attributes to incomplete bladder emptying.  Urinalysis today is benign with 0-5 WBCs, 0 RBCs, few bacteria, nitrite negative, no leukocytes, and PVR is normal at 0 ml.  He also has been having some constipation, and he feels this could also be contributing to his lower abdominal discomfort.  I recommended increasing his doxazosin dose to 8 mg, risks and benefits discussed, and he will keep follow-up as scheduled for UroLift later this month. ? ?Doxazosin increased to 8 mg ?Recommended MiraLAX for constipation ?Keep follow-up as scheduled for UroLift later this month ? ?Billey Co, MD ? ?[facility] ?[address] ?[place], [place] [address] ?[phone] ? ?

## 2021-10-05 NOTE — Patient Instructions (Signed)
keep follow-up as scheduled for UroLift later this month ?

## 2021-10-06 ENCOUNTER — Encounter: Admission: RE | Payer: Self-pay | Source: Home / Self Care

## 2021-10-06 ENCOUNTER — Ambulatory Visit: Admission: RE | Admit: 2021-10-06 | Payer: Medicare Other | Source: Home / Self Care | Admitting: Urology

## 2021-10-06 LAB — MICROSCOPIC EXAMINATION: RBC, Urine: NONE SEEN /hpf (ref 0–2)

## 2021-10-06 LAB — URINALYSIS, COMPLETE
Bilirubin, UA: NEGATIVE
Glucose, UA: NEGATIVE
Leukocytes,UA: NEGATIVE
Nitrite, UA: NEGATIVE
Specific Gravity, UA: >1.03 — ABNORMAL HIGH (ref 1.005–1.030)
Urobilinogen, Ur: 1 mg/dL (ref 0.2–1.0)
pH, UA: 6 (ref 5.0–7.5)

## 2021-10-06 SURGERY — CYSTOSCOPY WITH INSERTION OF UROLIFT
Anesthesia: General

## 2021-10-08 LAB — CULTURE, URINE COMPREHENSIVE

## 2021-10-14 ENCOUNTER — Other Ambulatory Visit: Payer: Self-pay | Admitting: Family Medicine

## 2021-10-16 ENCOUNTER — Other Ambulatory Visit: Payer: Self-pay | Admitting: Family Medicine

## 2021-10-25 ENCOUNTER — Encounter: Payer: Self-pay | Admitting: Internal Medicine

## 2021-10-25 ENCOUNTER — Ambulatory Visit (INDEPENDENT_AMBULATORY_CARE_PROVIDER_SITE_OTHER): Payer: Medicare Other | Admitting: Internal Medicine

## 2021-10-25 VITALS — BP 130/84 | HR 81 | Ht 72.0 in | Wt 263.0 lb

## 2021-10-25 DIAGNOSIS — R150 Incomplete defecation: Secondary | ICD-10-CM

## 2021-10-25 DIAGNOSIS — R1032 Left lower quadrant pain: Secondary | ICD-10-CM | POA: Diagnosis not present

## 2021-10-25 DIAGNOSIS — R0989 Other specified symptoms and signs involving the circulatory and respiratory systems: Secondary | ICD-10-CM | POA: Diagnosis not present

## 2021-10-25 DIAGNOSIS — R1031 Right lower quadrant pain: Secondary | ICD-10-CM

## 2021-10-25 NOTE — Progress Notes (Signed)
? ?James [name] 76 y.o. 09/08/1945 [id number] ? ?Assessment & Plan:  ? ?Encounter Diagnoses  ?Name Primary?  ? Globus sensation Yes  ? Incomplete defecation   ? Bilateral lower abdominal pain   ? ? ?Evaluate the globus with the barium swallow and tablet. ? ?For the incomplete defecation and lower abdominal discomfort start with fiber supplementation add psyllium working up to a tablespoon a day.  He may have dyssynergy defecation based upon his rectal exam.  Depending upon his response to conservative measures could consider pelvic PT. ? ?CC: Tonia Ghent, MD ? ? ?Subjective:  ? ?Chief Complaint: Globus and incomplete defecation ? ?HPI ?76 year old white man known to me with history of colon polyps and diverticulosis (see below), other problems include atrial fibrillation on Eliquis, mitral regurgitation, hemorrhoids, congestive heart failure and coronary artery disease who is here with 2 problems-globus and incomplete defecation ? ?For several months he has had a sensation of a lump in his throat and sometimes it feels like "my throat is closing up".  There is no dysphagia to food or pills.  There is no sinus drainage heartburn or reflux issues.  He is not under any stress or anxiety.  There is been no medication change.  He has never had an upper endoscopy and no evaluation for this so far.  Not noted any lumps or swelling in his neck. ? ? ?He also reports a several month history of some discomfort in the lower abdomen and incomplete defecation.  He has a history of BPH and he saw his urologist and he had a cystoscopy at some point in the past and they did an ultrasound and did not think he had issues other than his known BPH.  He finds that if he has a "good bowel movement" he will feel better.  He uses MiraLAX intermittently and stools tend to be soft.  He just feels like he cannot completely defecate.  Not an everyday phenomenon but it is a bother and he wanted it checked out.  The discomfort seems  to be more of an issue when he is lying in bed in the morning and improves as he gets up and moves around.  He does not report any rectal bleeding or significant changes in his bowels and again no change in medications.  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 ? ? ? ?Colonoscopy 10/19/2019 ?- Three 1 to 2 mm polyps in the transverse colon and in the ascending colon, removed with a ?cold biopsy forceps. Resected and retrieved. ADENOMAS ?- Diverticulosis in the entire examined colon. ?- Internal hemorrhoids. ?- The examination was otherwise normal on direct and retroflexion views. There were tattoos ?in ascending colon from marking of large polypectomy site 2017 ?- Personal history of colonic polyps. 2017 25 mm tv adenoma + other diminutive adenomas, ?2018 3 diminutive adenomas and no residual 25 mm polyp ? ? ?Allergies  ?Allergen Reactions  ? Sulfonamide Derivatives Rash  ?  childhood  ? ?Current Meds  ?Medication Sig  ? apixaban (ELIQUIS) 5 MG TABS tablet TAKE 1 TABLET(5 MG) BY MOUTH TWICE DAILY  ? atorvastatin (LIPITOR) 80 MG tablet TAKE 1 TABLET BY MOUTH DAILY AT 6 PM  ? diclofenac Sodium (VOLTAREN) 1 % GEL Apply 2 g topically 4 (four) times daily as needed.  ? doxazosin (CARDURA) 4  MG tablet TAKE 1 TABLET BY MOUTH DAILY AFTER BREAKFAST  ? ezetimibe (ZETIA) 10 MG tablet TAKE 1 TABLET BY MOUTH DAILY.  ? finasteride (PROSCAR) 5 MG tablet TAKE 1 TABLET BY MOUTH EVERY DAY  ? furosemide (LASIX) 20 MG tablet Take 1 tablet (20 mg total) by mouth every other day  ? ibuprofen (ADVIL) 200 MG tablet Take 400 mg by mouth every 6 (six) hours as needed for moderate pain.  ? levothyroxine (SYNTHROID) 150 MCG tablet Take 1 tablet (150 mcg total) by mouth daily before breakfast. Except take 1/2 tablet on Sundays.  Total of 6.5 tablets in 1 week.  ? Multiple Vitamin (MULTIVITAMIN WITH MINERALS) TABS  tablet Take 1 tablet by mouth daily. Senior Multivitamin  ? polyethylene glycol (MIRALAX) 17 g packet Take 17 g by mouth 2 (two) times daily.  ? ramipril (ALTACE) 10 MG capsule TAKE 1 CAPSULE BY MOUTH DAILY GENERIC EQUIVALENT FOR ALTACE  ? White Petrolatum-Mineral Oil (REFRESH P.M. OP) Place 1 drop into both eyes at bedtime as needed (dry eyes).  ? ?Past Medical History:  ?Diagnosis Date  ? (HFpEF) heart failure with preserved ejection fraction ([place])   ? a. 05/2018 Echo: EF 55-60%, gr2 DD.  ? Acute lower GI bleeding after colonoscopy and polypectomy, resolved 12/12/2015  ? Ascending aortic aneurysm   ? a. 02/2018 Echo: mildly dil Ao root/asc ao/arch; b. 05/2018 CTA Chest: 4.7cm fusiform aneurysm of Asc Ao.  ? CAD (coronary artery disease)   ? a. 05/2018 Cath/PCI: LM nl,LAD 30p, 90/99d/apical, LCX 47m OM1/2/3 min irregs, RCA 85p (3.5x18 [place].  ? Cardiac murmur   ? a. 02/2018 Echo: EF 60-65%, no rwma, mild AI, mildly dil Ao root/Asc Ao/Arch, Mild MR, mildly dil LA. Nl RV fxn.  ? Cataract   ? bilateral surgery to remove  ? CHF (congestive heart failure) ([place]   ? Colon polyp   ? Constipation   ? Essential hypertension   ? Hemorrhoids   ? Hepatitis   ? self resolved, likely food exposure, 1974.   ? History of cardiovascular stress test   ? a. 02/2018 Myoview: EF 55-65%, small/mild apical defect w/ nl wall motion ->attenuation artifact. No ischemia. Low risk study.  ? Hx of adenomatous colonic polyps 12/05/2015  ? Hyperlipidemia   ? Hypothyroidism   ? Mitral regurgitation   ? a. 110/2019 Echo: EF 55-60%. Gr2 DD. Triv AI. Ao root 371m Mild MR. Mod dil LA, mildly dil RA.  ? Osteoarthritis   ? Persistent atrial fibrillation ([place]  ? a. Dx 02/2018. CHA2DS2VASc = 2-->Eliquis; b. 03/2018 DCCV (200J); c. 03/2018 recurrent Afib-->flecainide started 04/2018;  d. 05/06/2018 s/p successful DCCV - 200J x 2-->on amio.  ? Skin cancer   ? basal cell, R ear, MOHS  ? ?Past Surgical History:  ?Procedure Laterality Date  ? BROW LIFT  Bilateral 10/23/2016  ? Procedure: BLEPHAROPLASTY upper eyelid with excess skin;  Surgeon: AmKarle StarchMD;  [facility] Service: Ophthalmology;  Laterality: Bilateral;  MAC  ? CARDIOVERSION N/A 03/18/2018  ? Procedure: CARDIOVERSION;  Surgeon: EnNelva BushMD;  Location: [facility] ORS;  Service: Cardiovascular;  Laterality: N/A;  ? CARDIOVERSION N/A 05/06/2018  ? Procedure: CARDIOVERSION;  Surgeon: EnNelva BushMD;  Location: [facility] ORS;  Service: Cardiovascular;  Laterality: N/A;  ? CATARACT EXTRACTION  1986  ? OD  ? CATARACT EXTRACTION Bilateral 1995  ? x 2 for right and left   ? COLONOSCOPY    ? CORONARY STENT INTERVENTION  N/A 05/13/2018  ? Procedure: CORONARY STENT INTERVENTION;  Surgeon: Nelva Bush, MD;  Location: [facility] [facility];  Service: Cardiovascular;  Laterality: N/A;  ? ECTROPION REPAIR Bilateral 10/23/2016  ? Procedure: REPAIR OF ECTROPION sutures, extensive;  Surgeon: Karle Starch, MD;  Location: [place];  Service: Ophthalmology;  Laterality: Bilateral;  ? LIPOMA EXCISION  06/1997  ? left neck ([place])  ? MOHS SURGERY Right 2013  ? behind right ear  ? RIGHT/LEFT HEART CATH AND CORONARY ANGIOGRAPHY N/A 05/12/2018  ? Procedure: RIGHT/LEFT HEART CATH AND CORONARY ANGIOGRAPHY;  Surgeon: Wellington Hampshire, MD;  Location: [place] [facility];  Service: Cardiovascular;  Laterality: N/A;  ? TONSILLECTOMY    ? VASECTOMY  1982  ? ?Social History  ? ?Social History Narrative  ? Married and lives with wife 1974  ? One daughter, not local  ? Retired from [employer], sea based telecommunication/contracting  ? ?family history includes Heart disease in his paternal grandfather; Lung cancer in his maternal grandfather; Stroke in his maternal grandfather and mother. ? ? ?Review of Systems ? ?See HPI.  He is about to be hospitalized for a change in cardiac drug therapy and monitoring in early April. ?Objective:  ? Physical Exam ?BP 130/84   Pulse 81   Ht 6' (1.829 m)   Wt 263 lb  (119.3 kg)   BMI 35.67 kg/m?  ?Obese pleasant white man no acute distress ?The neck is supple without thyromegaly or mass or cervical or supraclavicular lymphadenopathy ?The abdomen is obese soft and nontender with

## 2021-10-25 NOTE — Patient Instructions (Signed)
If you are age 75 or older, your body mass index should be between 23-30. Your Body mass index is 35.67 kg/m?Marland Kitchen If this is out of the aforementioned range listed, please consider follow up with your Primary Care Provider. ? ? ?The [place] [facility] providers would like to encourage you to use [facility] to communicate with providers for non-urgent requests or questions.  Due to long hold times on the telephone, sending your provider a message by [facility] may be a faster and more efficient way to get a response.  Please allow 48 business hours for a response.  Please remember that this is for non-urgent requests.  ?_______________________________________________________ ? ? ?Work up to one tablespoon of Metamucil daily and you may use Miralax as needed. ? ?You have been scheduled for a Barium Esophogram at [facility] on 11/01/2021 at 10:00am. Please arrive 30 minutes prior to your appointment for registration. Make certain not to have anything to eat or drink 3 hours prior to your test. If you need to reschedule for any reason, please contact radiology at [phone] to do so. ?__________________________________________________________________ ?A barium swallow is an examination that concentrates on views of the esophagus. This tends to be a double contrast exam (barium and two liquids which, when combined, create a gas to distend the wall of the oesophagus) or single contrast (non-ionic iodine based). The study is usually tailored to your symptoms so a good history is essential. Attention is paid during the study to the form, structure and configuration of the esophagus, looking for functional disorders (such as aspiration, dysphagia, achalasia, motility and reflux) ?EXAMINATION ?You may be asked to change into a gown, depending on the type of swallow being performed. A radiologist and radiographer will perform the procedure. The radiologist will advise you of the ?type of contrast selected for your procedure  and direct you during the exam. You will be asked to stand, sit or lie in several different positions and to hold a small amount of fluid in your mouth before being asked to swallow while the imaging is performed .In some instances you may be asked to swallow barium coated marshmallows to assess the motility of a solid food bolus. ?The exam can be recorded as a digital or video fluoroscopy procedure. ?POST PROCEDURE ?It will take 1-2 days for the barium to pass through your system. To facilitate this, it is important, unless otherwise directed, to increase your fluids for the next 24-48hrs and to resume your normal diet.  ?This test typically takes about 30 minutes to perform. ?________________________________________________________ ?I appreciate the opportunity to care for you. ?Silvano Rusk, MD, FACG__________________________ ?  ?

## 2021-10-26 ENCOUNTER — Ambulatory Visit: Payer: Medicare Other | Admitting: Urology

## 2021-10-30 ENCOUNTER — Ambulatory Visit (HOSPITAL_COMMUNITY): Payer: Medicare Other | Admitting: Physician Assistant

## 2021-11-01 ENCOUNTER — Ambulatory Visit (INDEPENDENT_AMBULATORY_CARE_PROVIDER_SITE_OTHER): Payer: Medicare Other | Admitting: Orthopedic Surgery

## 2021-11-01 ENCOUNTER — Ambulatory Visit
Admission: RE | Admit: 2021-11-01 | Discharge: 2021-11-01 | Disposition: A | Discharge status: Home / Self Care | Payer: Medicare Other | Source: Ambulatory Visit | Attending: Internal Medicine | Admitting: Internal Medicine

## 2021-11-01 ENCOUNTER — Ambulatory Visit (INDEPENDENT_AMBULATORY_CARE_PROVIDER_SITE_OTHER): Payer: Medicare Other

## 2021-11-01 ENCOUNTER — Other Ambulatory Visit: Payer: Self-pay

## 2021-11-01 DIAGNOSIS — K224 Dyskinesia of esophagus: Secondary | ICD-10-CM | POA: Diagnosis not present

## 2021-11-01 DIAGNOSIS — M25551 Pain in right hip: Secondary | ICD-10-CM | POA: Diagnosis not present

## 2021-11-01 DIAGNOSIS — R0989 Other specified symptoms and signs involving the circulatory and respiratory systems: Secondary | ICD-10-CM | POA: Diagnosis not present

## 2021-11-01 IMAGING — RF DG ESOPHAGUS
9 series · 14 of 24 positions shown · non-contrast
Comparison: NONE.

CLINICAL DATA: Patient complains of chronic globus sensation for
several years with no significant dysphagia, no prior pertinent
surgeries and no recent endoscopies.

EXAM:
ESOPHAGUS/BARIUM SWALLOW/TABLET STUDY
TECHNIQUE: Combined double and single contrast examination was performed using
effervescent crystals, thick barium liquid and thin barium liquid.
The patient was observed with fluoroscopy swallowing a 13 mm barium
sulphate tablet.
FLUOROSCOPY TIME:  Radiation Exposure Index (as provided by the
fluoroscopic device): 16.70 mGy

[Series 1: cp_standard · 0.17mm/px · 1 of 1 slices shown (1 of 9)]
[im 1/1]
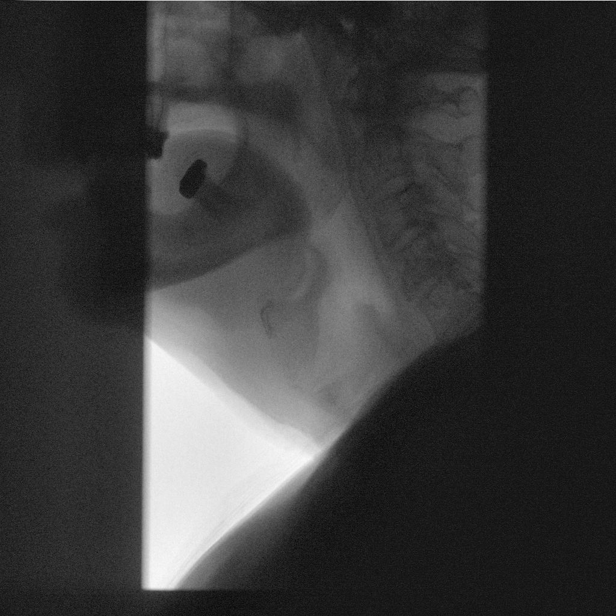

[Series 2: cp_standard · 0.17mm/px · 1 of 39 frames shown (2 of 9)]
[frame 34/39]
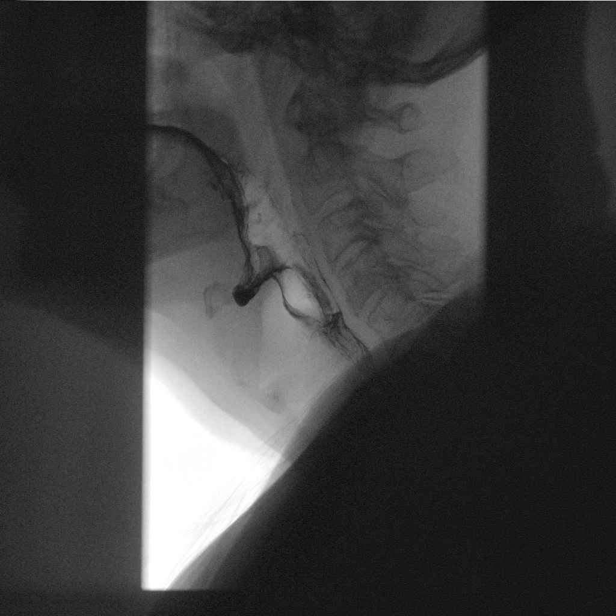

[Series 3: cp_standard · 0.17mm/px · 1 of 47 frames shown (3 of 9)]
[frame 8/47]
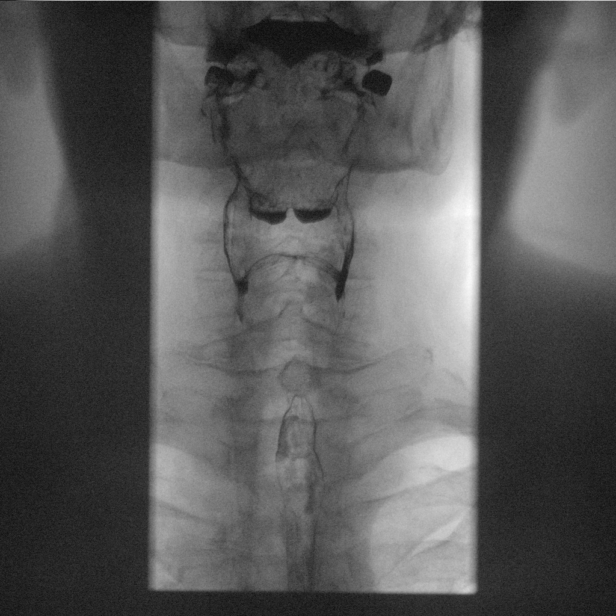

[Series 4: cp_standard · 0.26mm/px · 2 of 73 frames shown (4 of 9)]
[frame 11/73]
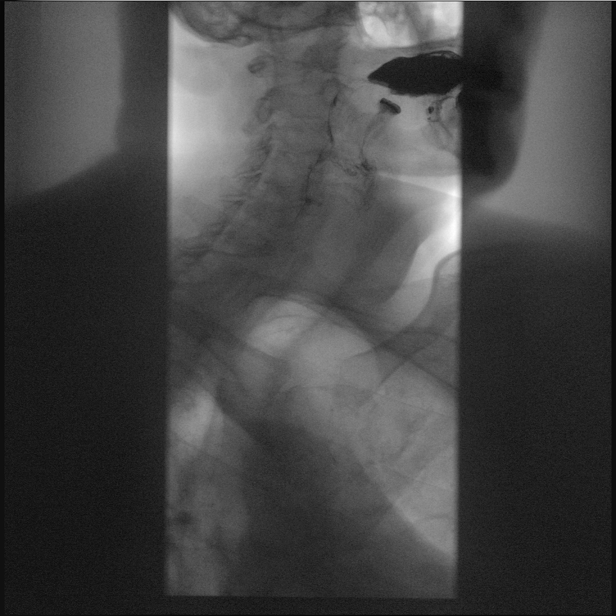
[frame 21/73]
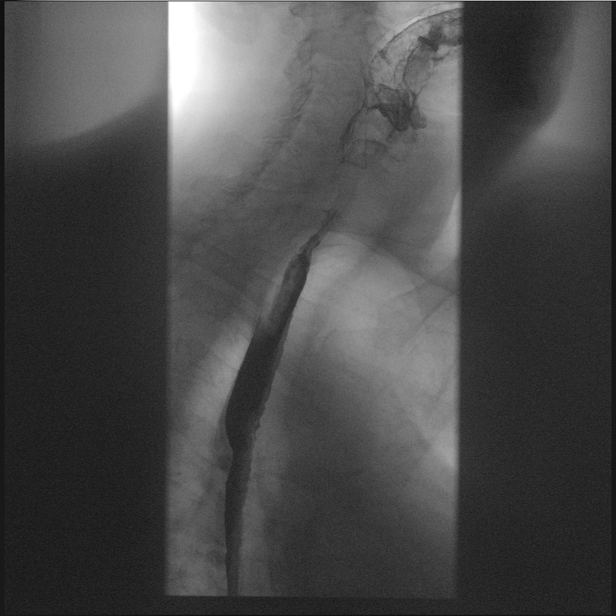

[Series 5: cp_standard · 0.26mm/px · 2 of 105 frames shown (5 of 9)]
[frame 2/105]
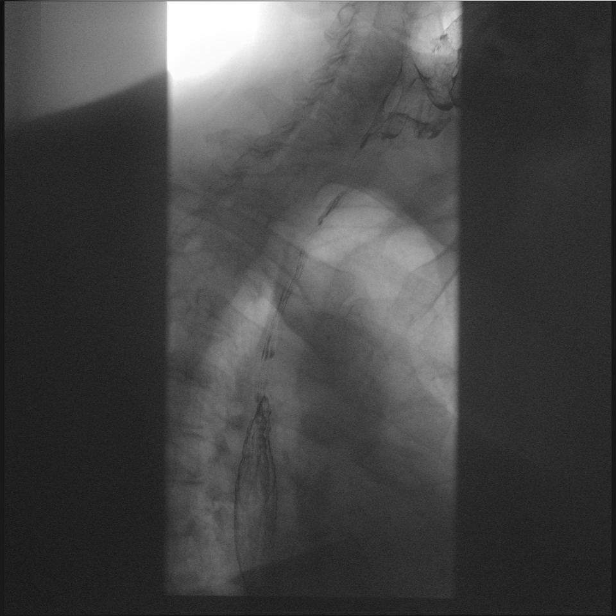
[frame 90/105]
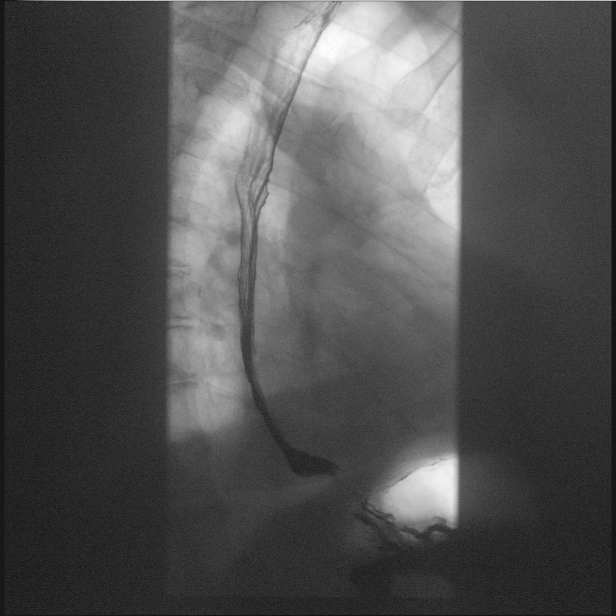

[Series 6: cp_standard · 0.26mm/px · 2 of 74 frames shown (6 of 9)]
[frame 12/74]
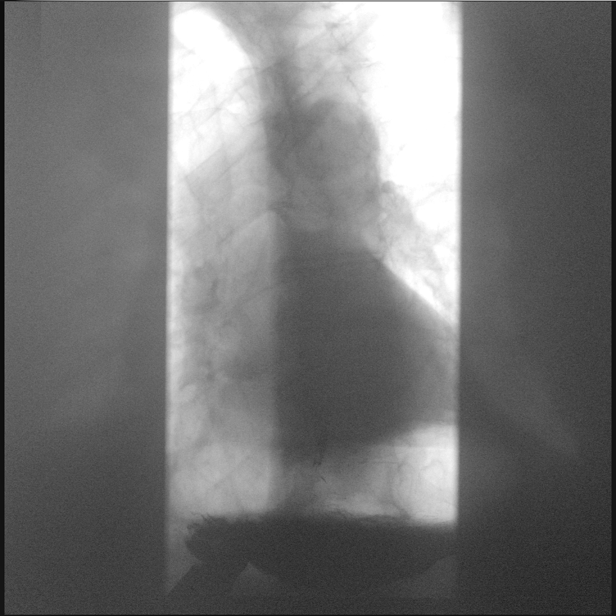
[frame 63/74]
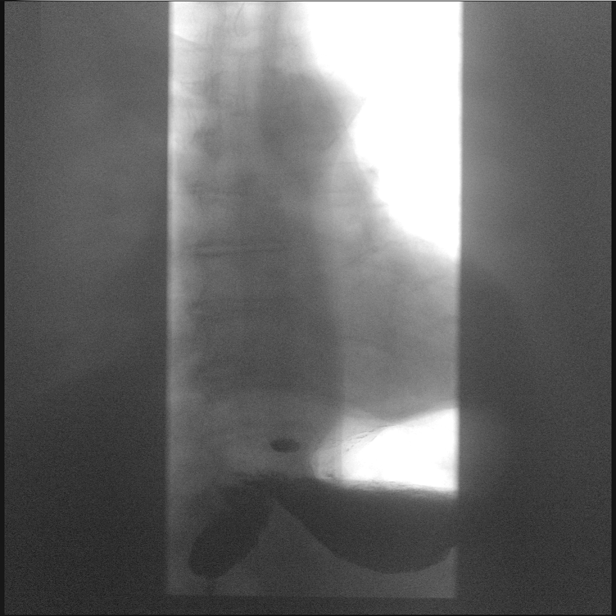

[Series 7: cp_standard · 0.29mm/px · 1 of 99 frames shown (7 of 9)]
[frame 50/99]
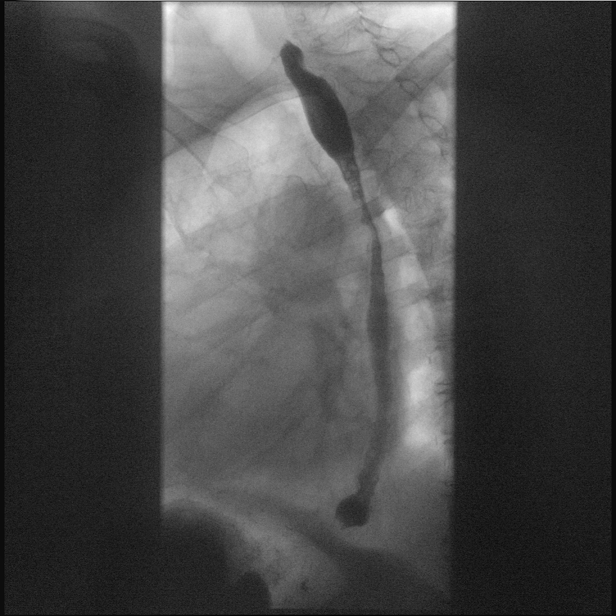

[Series 8: cp_standard · 0.29mm/px · 2 of 198 frames shown (8 of 9)]
[frame 30/198]
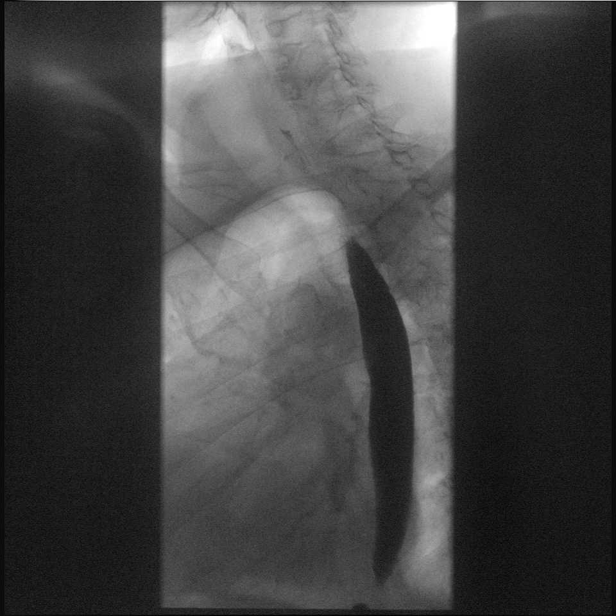
[frame 129/198]
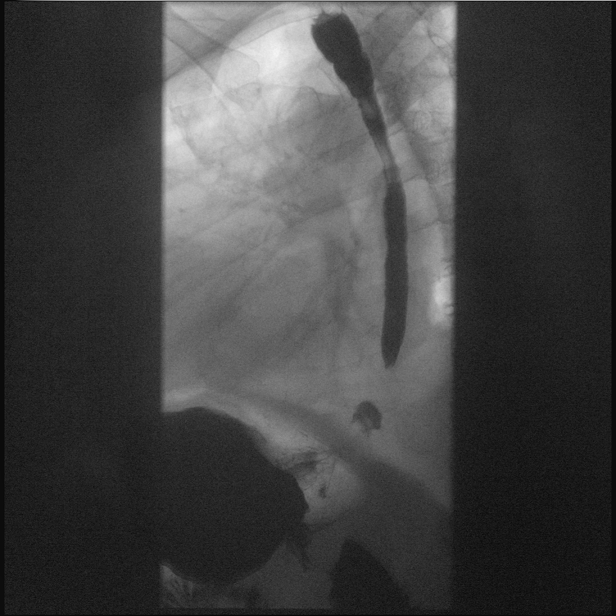

[Series 9: cp_standard · 0.29mm/px · 2 of 132 frames shown (9 of 9)]
[frame 3/132]
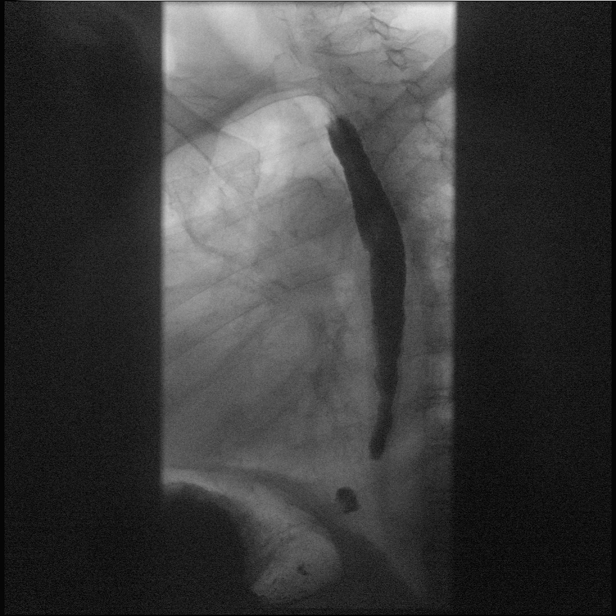
[frame 113/132]
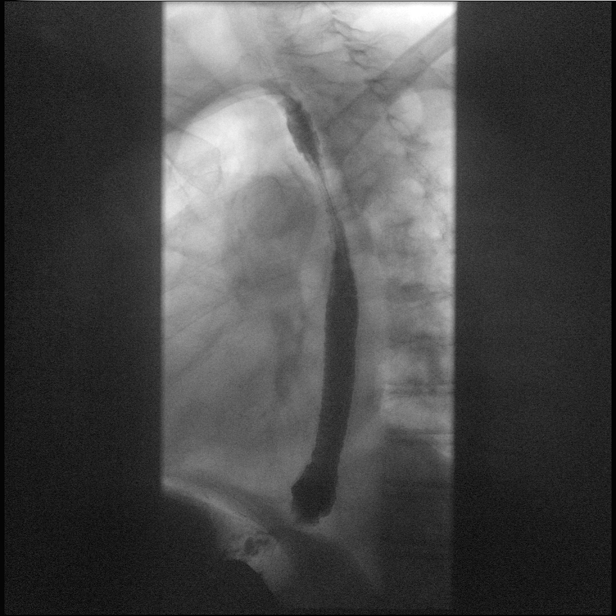

[14 of 24 positions shown; findings below may reference images not displayed]

FINDINGS: Normal pharyngeal anatomy and motility. No laryngeal penetration or
tracheal aspiration. Contrast flowed freely through the esophagus
without evidence of a stricture or mass. Normal esophageal mucosa
without evidence of irregularity or ulceration. Mild dysmotility
seen in the thoracic esophagus. No evidence of reflux. No definite
hiatal hernia was demonstrated.

A 13 mm barium tablet was administered which transited through the
esophagus and esophagogastric junction without delay.
IMPRESSION: 1.  Normal pharyngeal anatomy and motility.

2.  No evidence of esophageal stricture.

3. Mild dysmotility seen in the thoracic esophagus as can be seen
with spasm versus presbyesophagus.

4.  No hiatal hernia seen.

5.  No evidence of gastroesophageal reflux seen.

This exam was performed by MOOLMAN, and was supervised
and interpreted by Dr. MOOLMAN.

## 2021-11-01 MED ORDER — DIAZEPAM 10 MG PO TABS: ORAL_TABLET | ORAL | 0 refills | Status: DC

## 2021-11-03 ENCOUNTER — Encounter: Payer: Self-pay | Admitting: Orthopedic Surgery

## 2021-11-03 ENCOUNTER — Other Ambulatory Visit: Payer: Self-pay

## 2021-11-03 MED ORDER — OMEPRAZOLE 20 MG PO CPDR: 20.0000 mg | DELAYED_RELEASE_CAPSULE | Freq: Every day | ORAL | 0 refills | Status: DC

## 2021-11-03 NOTE — Progress Notes (Signed)
? ?Office Visit Note ?  ?Patient: James [name]           ?Date of Birth: [birth year]           ?MRN: [id number] ?Visit Date: 11/01/2021 ?Requested by: Tonia Ghent, MD ?Man ?[place],  [place] [address] ?PCP: Tonia Ghent, MD ? ?Subjective: ?Chief Complaint  ?Patient presents with  ? Right Hip - Follow-up, Pain  ? ? ?HPI: James [name] is a patient with right hip pain.  Had injection with Dr. Ernestina Patches into the right hip joint 06/12/2021.  Did get relief for about 2 months which was 50% relief.  States his pain is gotten worse over the past several weeks.  The pain does not wake him from sleep.  Denies any radicular pain or low back pain.  Is not having too much groin pain but is reporting some buttock pain.  States that his symptoms are limiting his ability to walk.  Definitely denies any groin pain but does report buttock pain which limits his walking endurance to about 150 yards. ?             ?ROS: All systems reviewed are negative as they relate to the chief complaint within the history of present illness.  Patient denies  fevers or chills. ? ? ?Assessment & Plan: ?Visit Diagnoses:  ?1. Pain in right hip   ? ? ?Plan: Impression is right hip pain with pretty reasonable exam in terms of range of motion and only mild joint space narrowing on plain radiographs right hip versus left.  By history the patient does not report any groin pain.  Did have improvement in symptoms with intra-articular hip injection.  Denies any low back pain but I think it is possible this could be coming from the back.  He has failed a course of treatment and therapy.  Needs MRI of the pelvis to evaluate for occult arthritis in the right hip.  If that is not present we may need to look at the back as the source of his symptoms. ? ?Follow-Up Instructions: Return for after MRI.  ? ?Orders:  ?Orders Placed This Encounter  ?Procedures  ? XR HIP UNILAT W OR W/O PELVIS 2-3 VIEWS RIGHT  ? MR Pelvis w/o contrast  ? ?Meds ordered this  encounter  ?Medications  ? diazepam (VALIUM) 10 MG tablet  ?  Sig: Take 1 60 minutes before MRI scan for anxiety.  ?  Dispense:  2 tablet  ?  Refill:  0  ? ? ? ? Procedures: ?No procedures performed ? ? ?Clinical Data: ?No additional findings. ? ?Objective: ?Vital Signs: There were no vitals taken for this visit. ? ?Physical Exam:  ? ?Constitutional: Patient appears well-developed ?HEENT:  ?Head: Normocephalic ?Eyes:EOM are normal ?Neck: Normal range of motion ?Cardiovascular: Normal rate ?Pulmonary/chest: Effort normal ?Neurologic: Patient is alert ?Skin: Skin is warm ?Psychiatric: Patient has normal mood and affect ? ? ?Ortho Exam: Ortho exam demonstrates nonantalgic gait to the right with no Trendelenburg gait.  No groin pain on the right with internal/external rotation of the right leg.  No nerve root tension signs.  Pedal pulses palpable.  Ankle dorsiflexion plantarflexion quad hamstring strength 5+ out of 5.  Hip flexion strength also symmetric.  No trochanteric tenderness to direct palpation. ? ?Specialty Comments:  ?No specialty comments available. ? ?Imaging: ?No results found. ? ? ?PMFS History: ?Patient Active Problem List  ? Diagnosis Date Noted  ? Rib injury 10/03/2021  ? Hyperglycemia  08/27/2021  ? Dysphagia 08/27/2021  ? Thoracic aortic aneurysm without rupture 11/25/2019  ? Chronic heart failure with preserved ejection fraction (HFpEF) ([place]) 08/18/2018  ? Hyperlipidemia LDL goal <70 08/18/2018  ? Obstructive sleep apnea 05/23/2018  ? Coronary artery disease involving native coronary artery of native heart without angina pectoris 05/16/2018  ? Unstable angina (HCC)   ? CHF (congestive heart failure) ([place]) 05/09/2018  ? Benign prostatic hyperplasia with nocturia 05/07/2018  ? Dyspnea on exertion 02/19/2018  ? Persistent atrial fibrillation ([place]) 02/12/2018  ? Cardiac murmur 02/12/2018  ? Knee pain 08/07/2017  ? Weak urinary stream 05/01/2016  ? Healthcare maintenance 05/01/2016  ? Hematuria 03/06/2016   ? Hx of adenomatous colonic polyps 12/05/2015  ? Advance care planning 07/21/2014  ? Medicare annual wellness visit, subsequent 07/01/2012  ? External hemorrhoids 06/27/2011  ? Hypothyroidism 02/24/2008  ? HYPERCHOLESTEROLEMIA 02/21/2007  ? Essential hypertension 02/21/2007  ? DEGENERATIVE JOINT DISEASE, CERVICAL SPINE 02/21/2007  ? ?Past Medical History:  ?Diagnosis Date  ? (HFpEF) heart failure with preserved ejection fraction ([place])   ? a. 05/2018 Echo: EF 55-60%, gr2 DD.  ? Acute lower GI bleeding after colonoscopy and polypectomy, resolved 12/12/2015  ? Ascending aortic aneurysm   ? a. 02/2018 Echo: mildly dil Ao root/asc ao/arch; b. 05/2018 CTA Chest: 4.7cm fusiform aneurysm of Asc Ao.  ? CAD (coronary artery disease)   ? a. 05/2018 Cath/PCI: LM nl,LAD 30p, 90/99d/apical, LCX 27m OM1/2/3 min irregs, RCA 85p (3.5x18 SBourg.  ? Cardiac murmur   ? a. 02/2018 Echo: EF 60-65%, no rwma, mild AI, mildly dil Ao root/Asc Ao/Arch, Mild MR, mildly dil LA. Nl RV fxn.  ? Cataract   ? bilateral surgery to remove  ? CHF (congestive heart failure) ([facility]   ? Colon polyp   ? Constipation   ? Essential hypertension   ? Hemorrhoids   ? Hepatitis   ? self resolved, likely food exposure, 1974.   ? History of cardiovascular stress test   ? a. 02/2018 Myoview: EF 55-65%, small/mild apical defect w/ nl wall motion ->attenuation artifact. No ischemia. Low risk study.  ? Hx of adenomatous colonic polyps 12/05/2015  ? Hyperlipidemia   ? Hypothyroidism   ? Mitral regurgitation   ? a. 110/2019 Echo: EF 55-60%. Gr2 DD. Triv AI. Ao root 329m Mild MR. Mod dil LA, mildly dil RA.  ? Osteoarthritis   ? Persistent atrial fibrillation ([facility] [place]  ? a. Dx 02/2018. CHA2DS2VASc = 2-->Eliquis; b. 03/2018 DCCV (200J); c. 03/2018 recurrent Afib-->flecainide started 04/2018;  d. 05/06/2018 s/p successful DCCV - 200J x 2-->on amio.  ? Skin cancer   ? basal cell, R ear, MOHS  ?  ?Family History  ?Problem Relation Age of Onset  ? Stroke Mother   ? Lung cancer  Maternal Grandfather   ?     smoker  ? Stroke Maternal Grandfather   ? Heart disease Paternal Grandfather   ?     MI, old age  ? Colon cancer Neg Hx   ? Colon polyps Neg Hx   ? Esophageal cancer Neg Hx   ? Rectal cancer Neg Hx   ? Stomach cancer Neg Hx   ? Bladder Cancer Neg Hx   ? Prostate cancer Neg Hx   ?  ?Past Surgical History:  ?Procedure Laterality Date  ? BROW LIFT Bilateral 10/23/2016  ? Procedure: BLEPHAROPLASTY upper eyelid with excess skin;  Surgeon: AmKarle StarchMD;  [facility] Service: Op[facility];  Laterality:  Bilateral;  MAC  ? CARDIOVERSION N/A 03/18/2018  ? Procedure: CARDIOVERSION;  Surgeon: Nelva Bush, MD;  Location: [facility];  Service: Cardiovascular;  Laterality: N/A;  ? CARDIOVERSION N/A 05/06/2018  ? Procedure: CARDIOVERSION;  Surgeon: Nelva Bush, MD;  Location: [facility];  Service: Cardiovascular;  Laterality: N/A;  ? CATARACT EXTRACTION  1986  ? OD  ? CATARACT EXTRACTION Bilateral 1995  ? x 2 for right and left   ? COLONOSCOPY    ? CORONARY STENT INTERVENTION N/A 05/13/2018  ? Procedure: CORONARY STENT INTERVENTION;  Surgeon: Nelva Bush, MD;  Location: [facility];  Service: Cardiovascular;  Laterality: N/A;  ? ECTROPION REPAIR Bilateral 10/23/2016  ? Procedure: REPAIR OF ECTROPION sutures, extensive;  Surgeon: Karle Starch, MD;  Location: [facility];  Service: Ophthalmology;  Laterality: Bilateral;  ? LIPOMA EXCISION  06/1997  ? left neck (Juengel)  ? MOHS SURGERY Right 2013  ? behind right ear  ? RIGHT/LEFT HEART CATH AND CORONARY ANGIOGRAPHY N/A 05/12/2018  ? Procedure: RIGHT/LEFT HEART CATH AND CORONARY ANGIOGRAPHY;  Surgeon: Wellington Hampshire, MD;  Location: [facility];  Service: Cardiovascular;  Laterality: N/A;  ? TONSILLECTOMY    ? VASECTOMY  1982  ? ?Social History  ? ?Occupational History  ? Occupation: Retired  ?  Employer: RETIRED  ?  Comment: 1  ?Tobacco Use  ? Smoking status: Former  ?  Packs/day: 2.00  ?  Years:  20.00  ?  Pack years: 40.00  ?  Types: Cigarettes  ?  Quit date: 08/07/1983  ?  Years since quitting: 38.2  ?  Passive exposure: Past  ? Smokeless tobacco: Never  ? Tobacco comments:  ?  quit over 40 years ago

## 2021-11-06 ENCOUNTER — Telehealth: Payer: Self-pay | Admitting: Orthopedic Surgery

## 2021-11-06 ENCOUNTER — Ambulatory Visit
Admission: RE | Admit: 2021-11-06 | Discharge: 2021-11-06 | Disposition: A | Discharge status: Home / Self Care | Payer: Medicare Other | Source: Ambulatory Visit | Attending: Orthopedic Surgery | Admitting: Orthopedic Surgery

## 2021-11-06 DIAGNOSIS — R600 Localized edema: Secondary | ICD-10-CM | POA: Diagnosis not present

## 2021-11-06 DIAGNOSIS — M25551 Pain in right hip: Secondary | ICD-10-CM

## 2021-11-06 IMAGING — MR MR PELVIS W/O CM
4 of 5 series · 25 of 48 positions shown · non-contrast
Comparison: Radiograph dated [DATE]

CLINICAL DATA: Evaluate occult osteoarthritis of right hip.

EXAM:
MRI PELVIS WITHOUT CONTRAST
TECHNIQUE: Multiplanar multisequence MR imaging of the pelvis was performed. No
intravenous contrast was administered.

[Series 3: STIR · coronal · right · 3.0mm · 1.25mm/px · 8 of 39 slices shown]
[im 1/39]
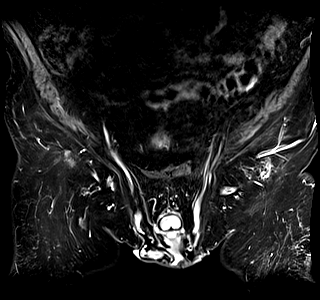
[im 5/39]
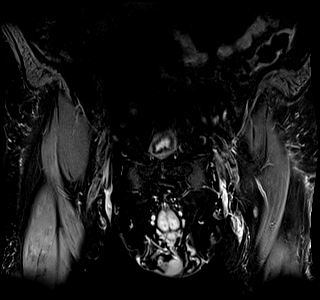
[im 13/39]
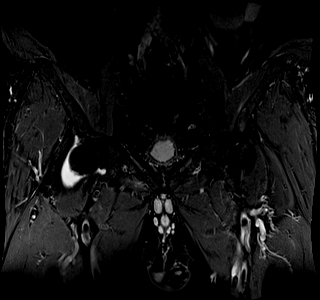
[im 17/39]
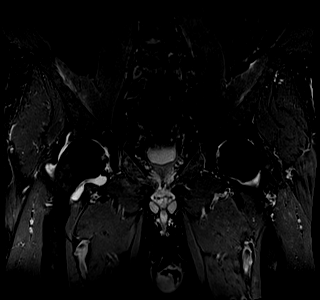
[im 22/39]
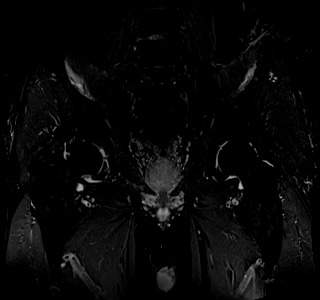
[im 26/39]
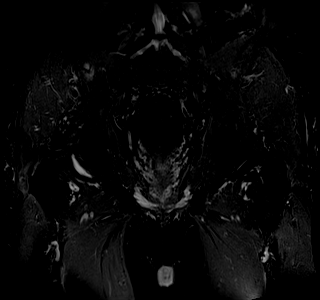
[im 34/39]
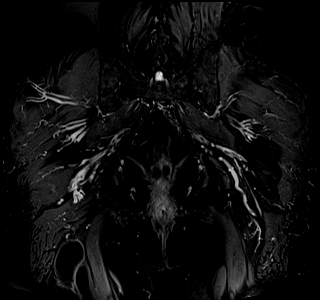
[im 39/39]
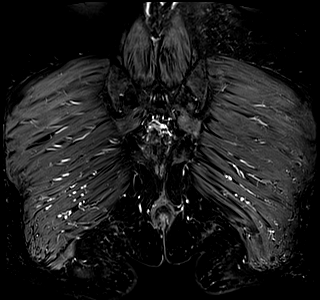

[Series 4: T1 · coronal · right · 3.0mm · 0.78mm/px · 9 of 39 slices shown (1 of 2)]
[im 1/39]
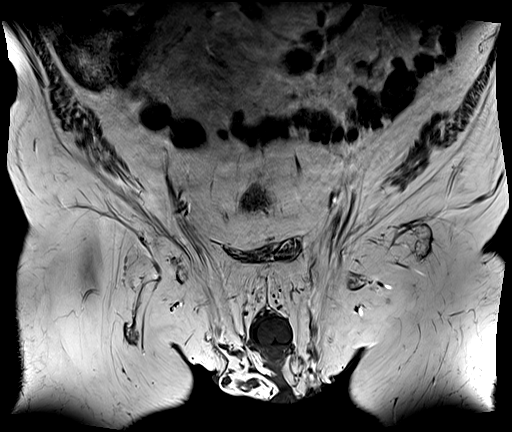
[im 5/39]
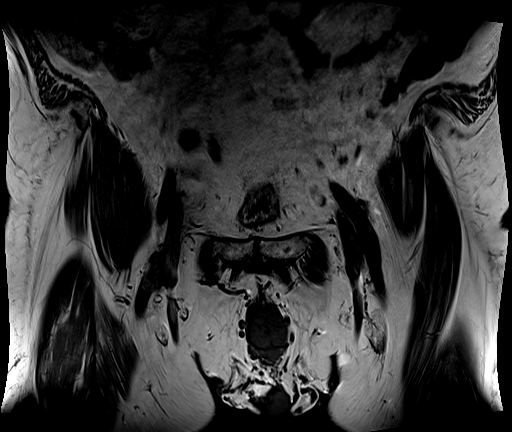
[im 10/39]
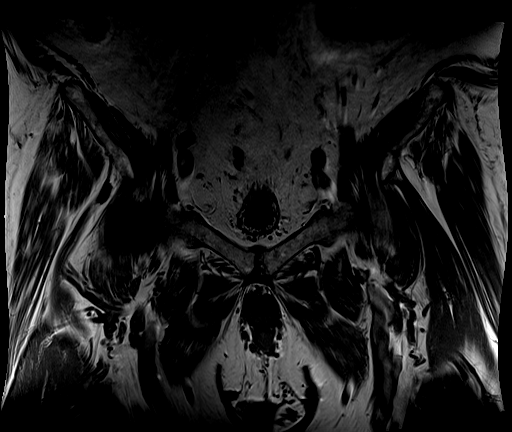
[im 15/39]
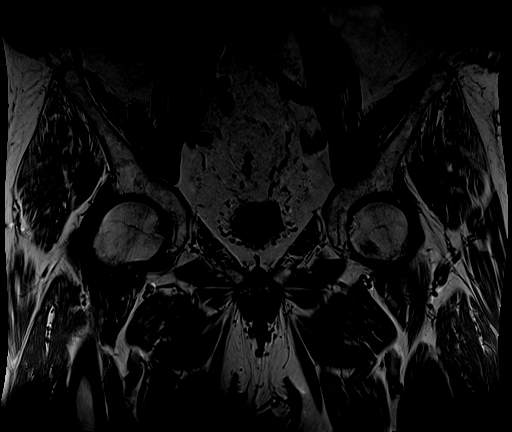
[im 20/39]
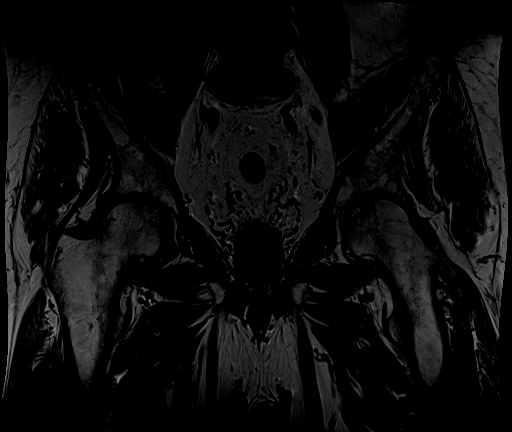
[im 24/39]
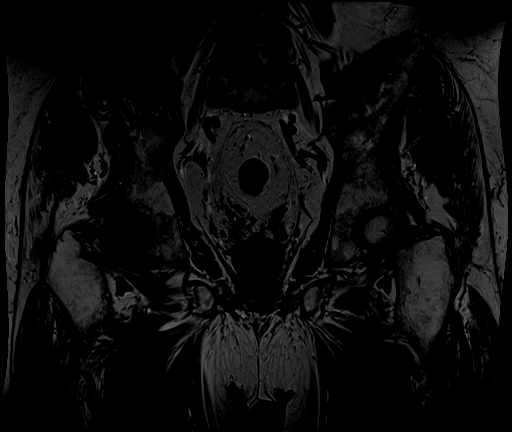
[im 29/39]
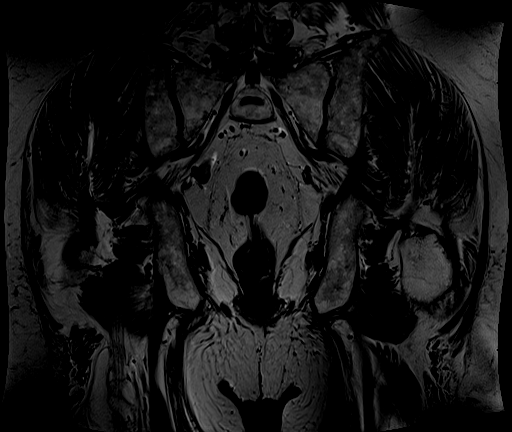
[im 34/39]
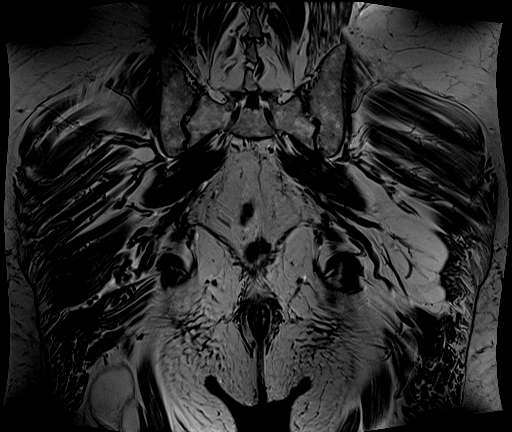
[im 39/39]
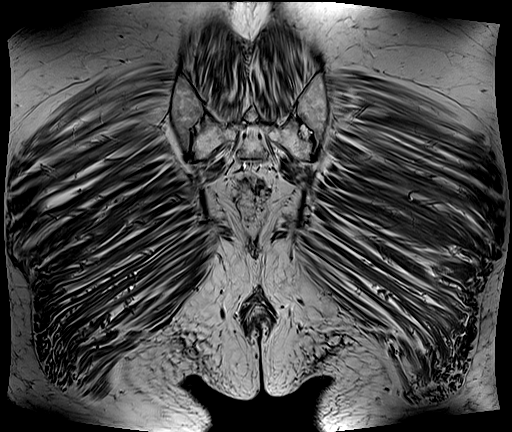

[Series 5: T1 · axial · right · 4.0mm · 0.78mm/px · z∈[-120,+125]mm · 5 of 60 slices shown (2 of 2)]
[im 1/60]
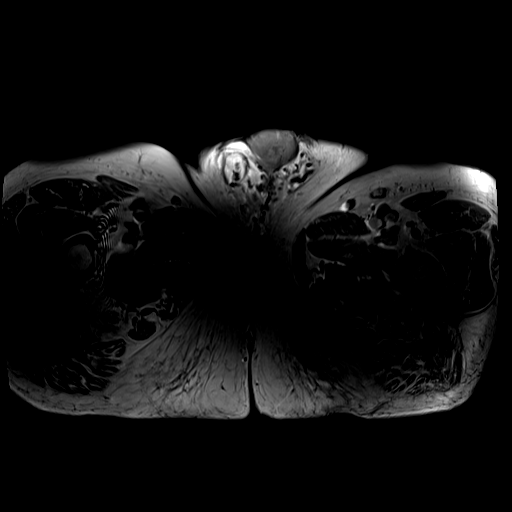
[im 10/60]
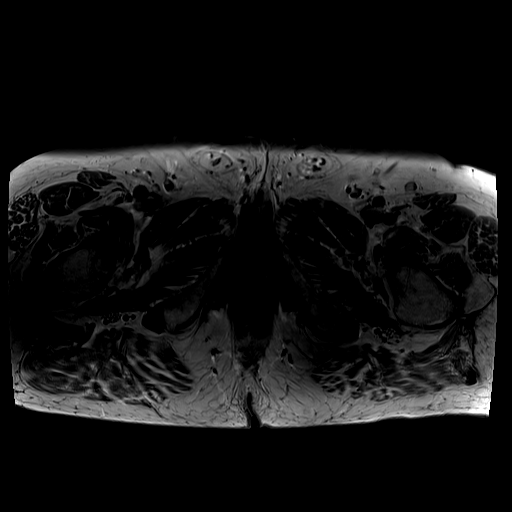
[im 19/60]
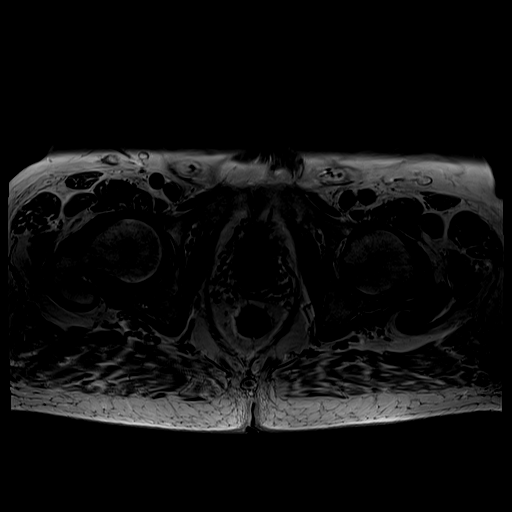
[im 32/60]
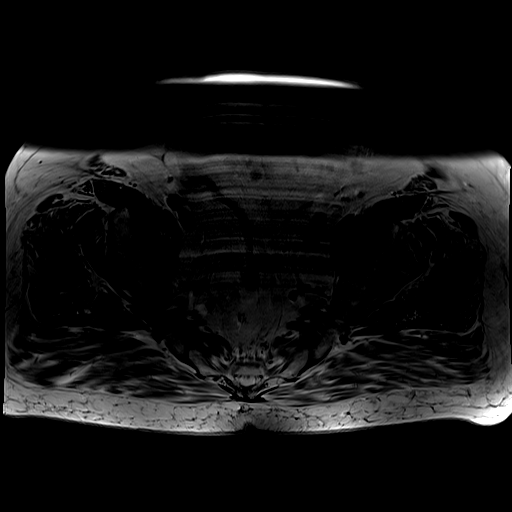
[im 50/60]
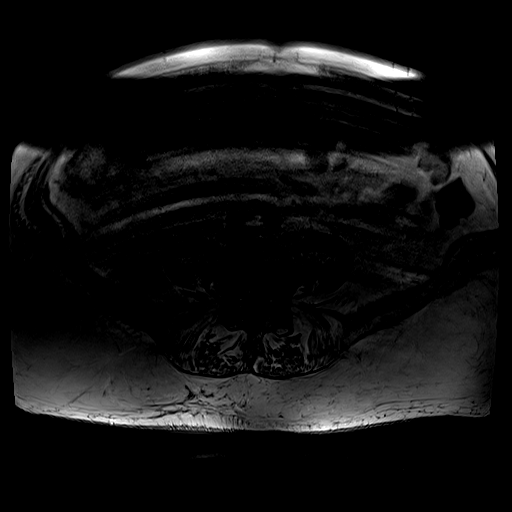

[Series 6: T2 fat-sat · axial · right · 5.0mm · 0.78mm/px · z∈[-46,+94]mm · 3 of 30 slices shown]
[im 5/30]
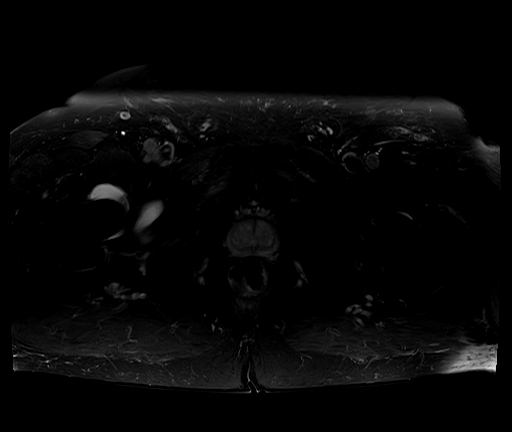
[im 15/30]
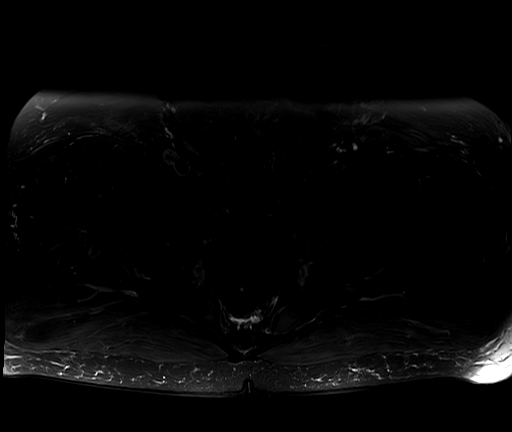
[im 25/30]
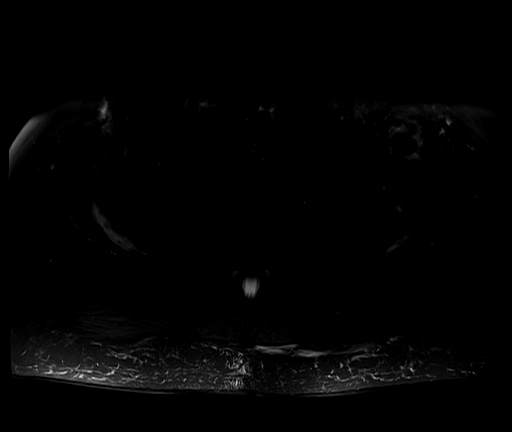

[25 of 48 positions shown; findings below may reference images not displayed]

FINDINGS: Bones:

No hip fracture, dislocation or avascular necrosis.

No periosteal reaction or bone destruction. No aggressive osseous
lesion.

Normal sacrum and sacroiliac joints. No SI joint widening or erosive
changes.

Articular cartilage and labrum

Articular cartilage: Generalized articular cartilage thinning
without evidence of full-thickness defect.

Labrum: Grossly intact, but evaluation is limited by lack of
intraarticular fluid.

Joint or bursal effusion

Joint effusion:  No hip joint effusion.  No SI joint effusion.

Bursae:  No bursa formation.

Muscles and tendons

Minimal edema about the origin of the iliacus muscle bilaterally.
Iliopsoas tendon is intact. The muscles of the flexor, extensor and
adductor compartments are intact. Mild tendinopathy of the hamstring
origin without evidence of tear.

Other findings

No pelvic free fluid. No fluid collection or hematoma. No inguinal
lymphadenopathy. No inguinal hernia.
IMPRESSION: 1.  No evidence of fracture or dislocation.

2. Mild articular cartilage thinning without evidence of significant
osteoarthritis. No appreciable joint effusion.

3. Mild edema about the origin of the iliacus muscle as well as mild
hamstring tendinopathy. No appreciable tendon tear.

## 2021-11-06 NOTE — Telephone Encounter (Signed)
LMOM for pt to return call to sch schedule post MRI after 11/06/21 with GD ?

## 2021-11-07 ENCOUNTER — Encounter (HOSPITAL_COMMUNITY): Payer: Self-pay | Admitting: Cardiology

## 2021-11-07 ENCOUNTER — Other Ambulatory Visit: Payer: Self-pay

## 2021-11-07 ENCOUNTER — Inpatient Hospital Stay (HOSPITAL_COMMUNITY)
Admission: AD | Admit: 2021-11-07 | Discharge: 2021-11-10 | DRG: 309 | Disposition: A | Discharge status: Home / Self Care | Payer: Medicare Other | Attending: Cardiology | Admitting: Cardiology

## 2021-11-07 ENCOUNTER — Other Ambulatory Visit (HOSPITAL_COMMUNITY): Payer: Self-pay

## 2021-11-07 ENCOUNTER — Ambulatory Visit (HOSPITAL_COMMUNITY)
Admission: RE | Admit: 2021-11-07 | Discharge: 2021-11-07 | Disposition: A | Discharge status: Home / Self Care | Payer: Medicare Other | Source: Ambulatory Visit | Attending: Nurse Practitioner | Admitting: Nurse Practitioner

## 2021-11-07 VITALS — BP 164/96 | HR 82 | Ht 72.0 in | Wt 266.4 lb

## 2021-11-07 DIAGNOSIS — Z955 Presence of coronary angioplasty implant and graft: Secondary | ICD-10-CM | POA: Diagnosis not present

## 2021-11-07 DIAGNOSIS — Z8249 Family history of ischemic heart disease and other diseases of the circulatory system: Secondary | ICD-10-CM | POA: Diagnosis not present

## 2021-11-07 DIAGNOSIS — Z9841 Cataract extraction status, right eye: Secondary | ICD-10-CM | POA: Diagnosis not present

## 2021-11-07 DIAGNOSIS — Z87891 Personal history of nicotine dependence: Secondary | ICD-10-CM | POA: Diagnosis not present

## 2021-11-07 DIAGNOSIS — Z85828 Personal history of other malignant neoplasm of skin: Secondary | ICD-10-CM

## 2021-11-07 DIAGNOSIS — I480 Paroxysmal atrial fibrillation: Secondary | ICD-10-CM

## 2021-11-07 DIAGNOSIS — I4819 Other persistent atrial fibrillation: Principal | ICD-10-CM

## 2021-11-07 DIAGNOSIS — I2510 Atherosclerotic heart disease of native coronary artery without angina pectoris: Secondary | ICD-10-CM | POA: Diagnosis not present

## 2021-11-07 DIAGNOSIS — Z79890 Hormone replacement therapy (postmenopausal): Secondary | ICD-10-CM

## 2021-11-07 DIAGNOSIS — Z7901 Long term (current) use of anticoagulants: Secondary | ICD-10-CM | POA: Diagnosis not present

## 2021-11-07 DIAGNOSIS — E785 Hyperlipidemia, unspecified: Secondary | ICD-10-CM | POA: Diagnosis present

## 2021-11-07 DIAGNOSIS — Z9842 Cataract extraction status, left eye: Secondary | ICD-10-CM | POA: Diagnosis not present

## 2021-11-07 DIAGNOSIS — Z86010 Personal history of colonic polyps: Secondary | ICD-10-CM

## 2021-11-07 DIAGNOSIS — I110 Hypertensive heart disease with heart failure: Secondary | ICD-10-CM | POA: Diagnosis not present

## 2021-11-07 DIAGNOSIS — I5032 Chronic diastolic (congestive) heart failure: Secondary | ICD-10-CM | POA: Diagnosis present

## 2021-11-07 DIAGNOSIS — Z79899 Other long term (current) drug therapy: Secondary | ICD-10-CM | POA: Diagnosis not present

## 2021-11-07 DIAGNOSIS — E039 Hypothyroidism, unspecified: Secondary | ICD-10-CM | POA: Diagnosis not present

## 2021-11-07 DIAGNOSIS — Z9852 Vasectomy status: Secondary | ICD-10-CM

## 2021-11-07 DIAGNOSIS — I7121 Aneurysm of the ascending aorta, without rupture: Secondary | ICD-10-CM | POA: Diagnosis present

## 2021-11-07 DIAGNOSIS — Z882 Allergy status to sulfonamides status: Secondary | ICD-10-CM

## 2021-11-07 DIAGNOSIS — I4891 Unspecified atrial fibrillation: Principal | ICD-10-CM | POA: Diagnosis present

## 2021-11-07 LAB — BASIC METABOLIC PANEL
Anion gap: 6 (ref 5–15)
BUN: 19 mg/dL (ref 8–23)
CO2: 27 mmol/L (ref 22–32)
Calcium: 9.5 mg/dL (ref 8.9–10.3)
Chloride: 108 mmol/L (ref 98–111)
Creatinine, Ser: 1.39 mg/dL — ABNORMAL HIGH (ref 0.61–1.24)
GFR, Estimated: 53 mL/min — ABNORMAL LOW (ref >60)
Glucose, Bld: 81 mg/dL (ref 70–99)
Potassium: 4.5 mmol/L (ref 3.5–5.1)
Sodium: 141 mmol/L (ref 135–145)

## 2021-11-07 LAB — MAGNESIUM: Magnesium: 2 mg/dL (ref 1.7–2.4)

## 2021-11-07 MED ORDER — PANTOPRAZOLE SODIUM 40 MG PO TBEC
40.0000 mg | DELAYED_RELEASE_TABLET | Freq: Every day | ORAL | Status: DC
  Administered 2021-11-08 – 2021-11-10 (×3): 40 mg via ORAL
  Filled 2021-11-07 (×3): qty 1

## 2021-11-07 MED ORDER — RAMIPRIL 5 MG PO CAPS
10.0000 mg | ORAL_CAPSULE | Freq: Every day | ORAL | Status: DC
  Administered 2021-11-08 – 2021-11-10 (×3): 10 mg via ORAL
  Filled 2021-11-07 (×3): qty 2

## 2021-11-07 MED ORDER — ATORVASTATIN CALCIUM 80 MG PO TABS
80.0000 mg | ORAL_TABLET | Freq: Every day | ORAL | Status: DC
  Administered 2021-11-07 – 2021-11-10 (×4): 80 mg via ORAL
  Filled 2021-11-07 (×4): qty 1

## 2021-11-07 MED ORDER — DOFETILIDE 500 MCG PO CAPS
500.0000 ug | ORAL_CAPSULE | Freq: Two times a day (BID) | ORAL | Status: DC
  Administered 2021-11-07 – 2021-11-10 (×6): 500 ug via ORAL
  Filled 2021-11-07 (×6): qty 1

## 2021-11-07 MED ORDER — SODIUM CHLORIDE 0.9% FLUSH: 3.0000 mL | INTRAVENOUS | Status: DC | PRN

## 2021-11-07 MED ORDER — SODIUM CHLORIDE 0.9 % IV SOLN: 250.0000 mL | INTRAVENOUS | Status: DC | PRN

## 2021-11-07 MED ORDER — APIXABAN 5 MG PO TABS
5.0000 mg | ORAL_TABLET | Freq: Two times a day (BID) | ORAL | Status: DC
  Administered 2021-11-07 – 2021-11-10 (×6): 5 mg via ORAL
  Filled 2021-11-07 (×6): qty 1

## 2021-11-07 MED ORDER — FUROSEMIDE 20 MG PO TABS
20.0000 mg | ORAL_TABLET | ORAL | Status: DC
  Administered 2021-11-09: 20 mg via ORAL
  Filled 2021-11-07: qty 1

## 2021-11-07 MED ORDER — SODIUM CHLORIDE 0.9% FLUSH
3.0000 mL | Freq: Two times a day (BID) | INTRAVENOUS | Status: DC
  Administered 2021-11-09 – 2021-11-10 (×3): 3 mL via INTRAVENOUS

## 2021-11-07 MED ORDER — IBUPROFEN 200 MG PO TABS: 400.0000 mg | ORAL_TABLET | ORAL | Status: DC | PRN

## 2021-11-07 MED ORDER — FINASTERIDE 5 MG PO TABS
5.0000 mg | ORAL_TABLET | Freq: Every day | ORAL | Status: DC
Start: 2021-11-08 — End: 2021-11-10
  Administered 2021-11-08 – 2021-11-10 (×3): 5 mg via ORAL
  Filled 2021-11-07 (×3): qty 1

## 2021-11-07 MED ORDER — LEVOTHYROXINE SODIUM 75 MCG PO TABS
150.0000 ug | ORAL_TABLET | Freq: Every day | ORAL | Status: DC
  Administered 2021-11-08 – 2021-11-10 (×3): 150 ug via ORAL
  Filled 2021-11-07 (×3): qty 2

## 2021-11-07 MED ORDER — EZETIMIBE 10 MG PO TABS
10.0000 mg | ORAL_TABLET | Freq: Every day | ORAL | Status: DC
  Administered 2021-11-08 – 2021-11-10 (×3): 10 mg via ORAL
  Filled 2021-11-07 (×3): qty 1

## 2021-11-07 MED ORDER — DOXAZOSIN MESYLATE 4 MG PO TABS
4.0000 mg | ORAL_TABLET | Freq: Every day | ORAL | Status: DC
  Administered 2021-11-08 – 2021-11-10 (×3): 4 mg via ORAL
  Filled 2021-11-07 (×3): qty 1

## 2021-11-07 NOTE — Care Management (Signed)
[phone] 11-07-21 Patient presented for Tikosyn Load. Benefits check submitted for Tikosyn cost. Case Manager will discuss cost and pharmacy of choice with the patient as he progresses.  ?

## 2021-11-07 NOTE — Progress Notes (Signed)
Pharmacy: Dofetilide (Tikosyn) - Initial Consult ?Assessment and Electrolyte Replacement ? ?Pharmacy consulted to assist in monitoring and replacing electrolytes in this 76 y.o. male admitted on 11/07/2021 undergoing dofetilide initiation. First dofetilide dose: 500 mcg 4/4 PM  ? ?Assessment: ? ?Patient Exclusion Criteria: If any screening criteria checked as "Yes", then  patient  should NOT receive dofetilide until criteria item is corrected.  ?If ?Yes? please indicate correction plan. ? ?YES  NO Patient  Exclusion Criteria Correction Plan  ? ?'[]'$   ?'[x]'$   ?Baseline QTc interval is greater than or equal to 440 msec. ?IF above YES box checked dofetilide contraindicated unless patient has ICD; then may proceed if QTc 500-550 msec or with known ventricular conduction abnormalities may proceed with QTc 550-600 msec. ?QTc = 427   ? ?'[]'$   ?'[x]'$   ?Patient is known or suspected to have a digoxin level greater than 2 ng/ml: ?No results found for: DIGOXIN ?   ? ?'[]'$   ?'[x]'$   ?Creatinine clearance less than 20 ml/min (calculated using Cockcroft-Gault, actual body weight and serum creatinine): ?Estimated Creatinine Clearance: 62.6 mL/min (A) (by C-G formula based on SCr of 1.39 mg/dL (H)). ?   ? ?'[]'$   ?'[x]'$  Patient has received drugs known to prolong the QT intervals within the last 48 hours (phenothiazines, tricyclics or tetracyclic antidepressants, erythromycin, H-1 antihistamines, cisapride, fluoroquinolones, azithromycin). Drugs not listed above may have an, as yet, undetected potential to prolong the QT interval, updated information on QT prolonging agents is available at this website:QT prolonging agents or www.crediblemeds.org   ? ?'[]'$   ?'[x]'$   ?Patient received a dose of hydrochlorothiazide (O[place]) alone or in any combination including triamterene (Dyazide, Maxzide) in the last 48 hours.   ? ?'[]'$   ?'[x]'$  Patient received a medication known to increase dofetilide plasma concentrations prior to initial dofetilide dose:  ?Trimethoprim  (Primsol, Proloprim) in the last 36 hours ?Verapamil (Calan, Verelan) in the last 36 hours or a sustained release dose in the last 72 hours ?Megestrol (Megace) in the last 5 days  ?Cimetidine (Tagamet) in the last 6 hours ?Ketoconazole (Nizoral) in the last 24 hours ?Itraconazole (Sporanox) in the last 48 hours  ?Prochlorperazine (Compazine) in the last 36 hours ?   ? ?'[]'$   ?'[x]'$   ?Patient is known to have a history of torsades de pointes; congenital or acquired long QT syndromes.   ? ?'[]'$   ?'[x]'$   ?Patient has received a Class 1 antiarrhythmic with less than 2 half-lives since last dose. ?(Disopyramide, Quinidine, Procainamide, Lidocaine, Mexiletine, Flecainide, Propafenone)   ? ?'[]'$   ?'[x]'$   ?Patient has received amiodarone therapy in the past 3 months or amiodarone level is greater than 0.3 ng/ml.   ? ?Patient has been appropriately anticoagulated with apixaban, no missed doses in last month per patient. ? ?Labs: ?   ?Component Value Date/Time  ? K 4.5 11/07/2021 1118  ? MG 2.0 11/07/2021 1118  ?  ? ?Plan: ?Potassium: ?K >/= 4: Appropriate to initiate Tikosyn, no replacement needed   ? ?Magnesium: ?Mg >2: Appropriate to initiate Tikosyn, no replacement needed   ? ? ?Thank you for allowing pharmacy to participate in this patient's care  ? ?Benetta Spar, PharmD, BCPS, BCCP ?Clinical Pharmacist ? ?Please check AMION for all [facility] phone numbers ?After 10:00 PM, call [place] [phone] ? ?

## 2021-11-07 NOTE — H&P (Addendum)
?Electrophysiology H&P  Note  ? ?Primary Care Physician: Tonia Ghent, MD ?Referring Physician:Dr. Quentin Ore ? ? ?James [name] is a 76 y.o. male with a h/o HTN, CHF, CAD that saw Dr. Quentin Ore  last November as he wanted to come off amiodarone for potential side effects and wanted other options to maintain SR. Dr. Quentin Ore did not feel he was a good ablation candidate and felt Tikosyn was the best   option, ? ?Now after allowing some time for amiodarone washout to permit loading of Tikosyn, he is here today to discuss Tikosyn admit. Amiodarone level done today.  ? ?Afib clinic, 4/423. Pt is here for Tikosyn admit. He has been off Tikosyn since last November. He is in SR with acceptable qt. No missed anticoagulation.No drugs that are contraindicated with Tikosyn. No Benadryl use. Jennalee Greaves be getting drug thru good rx. ? ?Today, he denies symptoms of palpitations, chest pain, shortness of breath, orthopnea, PND, lower extremity edema, dizziness, presyncope, syncope, or neurologic sequela. The patient is tolerating medications without difficulties and is otherwise without complaint today.  ? ?Past Medical History:  ?Diagnosis Date  ? (HFpEF) heart failure with preserved ejection fraction ([place])   ? a. 05/2018 Echo: EF 55-60%, gr2 DD.  ? Acute lower GI bleeding after colonoscopy and polypectomy, resolved 12/12/2015  ? Ascending aortic aneurysm   ? a. 02/2018 Echo: mildly dil Ao root/asc ao/arch; b. 05/2018 CTA Chest: 4.7cm fusiform aneurysm of Asc Ao.  ? CAD (coronary artery disease)   ? a. 05/2018 Cath/PCI: LM nl,LAD 30p, 90/99d/apical, LCX 62m OM1/2/3 min irregs, RCA 85p (3.5x18 [place].  ? Cardiac murmur   ? a. 02/2018 Echo: EF 60-65%, no rwma, mild AI, mildly dil Ao root/Asc Ao/Arch, Mild MR, mildly dil LA. Nl RV fxn.  ? Cataract   ? bilateral surgery to remove  ? CHF (congestive heart failure) ([place]   ? Colon polyp   ? Constipation   ? Essential hypertension   ? Hemorrhoids   ? Hepatitis   ? self resolved, likely  food exposure, 1974.   ? History of cardiovascular stress test   ? a. 02/2018 Myoview: EF 55-65%, small/mild apical defect w/ nl wall motion ->attenuation artifact. No ischemia. Low risk study.  ? Hx of adenomatous colonic polyps 12/05/2015  ? Hyperlipidemia   ? Hypothyroidism   ? Mitral regurgitation   ? a. 110/2019 Echo: EF 55-60%. Gr2 DD. Triv AI. Ao root 35m Mild MR. Mod dil LA, mildly dil RA.  ? Osteoarthritis   ? Persistent atrial fibrillation ([facility]  ? a. Dx 02/2018. CHA2DS2VASc = 2-->Eliquis; b. 03/2018 DCCV (200J); c. 03/2018 recurrent Afib-->flecainide started 04/2018;  d. 05/06/2018 s/p successful DCCV - 200J x 2-->on amio.  ? Skin cancer   ? basal cell, R ear, MOHS  ? ?Past Surgical History:  ?Procedure Laterality Date  ? BROW LIFT Bilateral 10/23/2016  ? Procedure: BLEPHAROPLASTY upper eyelid with excess skin;  Surgeon: AmKarle StarchMD;  [place] [facility] Service: Ophthalmology;  Laterality: Bilateral;  MAC  ? CARDIOVERSION N/A 03/18/2018  ? Procedure: CARDIOVERSION;  Surgeon: EnNelva BushMD;  Location: [facility];  Service: Cardiovascular;  Laterality: N/A;  ? CARDIOVERSION N/A 05/06/2018  ? Procedure: CARDIOVERSION;  Surgeon: EnNelva BushMD;  Location: [facility];  Service: Cardiovascular;  Laterality: N/A;  ? CATARACT EXTRACTION  1986  ? OD  ? CATARACT EXTRACTION Bilateral 1995  ? x 2 for right and left   ? COLONOSCOPY    ?  CORONARY STENT INTERVENTION N/A 05/13/2018  ? Procedure: CORONARY STENT INTERVENTION;  Surgeon: Nelva Bush, MD;  Location: [facility];  Service: Cardiovascular;  Laterality: N/A;  ? ECTROPION REPAIR Bilateral 10/23/2016  ? Procedure: REPAIR OF ECTROPION sutures, extensive;  Surgeon: Karle Starch, MD;  Location: [facility];  Service: Ophthalmology;  Laterality: Bilateral;  ? LIPOMA EXCISION  06/1997  ? left neck (Juengel)  ? MOHS SURGERY Right 2013  ? behind right ear  ? RIGHT/LEFT HEART CATH AND CORONARY ANGIOGRAPHY N/A 05/12/2018  ?  Procedure: RIGHT/LEFT HEART CATH AND CORONARY ANGIOGRAPHY;  Surgeon: Wellington Hampshire, MD;  Location: [facility];  Service: Cardiovascular;  Laterality: N/A;  ? TONSILLECTOMY    ? VASECTOMY  1982  ? ? ?No current facility-administered medications for this encounter.  ? ? ?Allergies  ?Allergen Reactions  ? Sulfonamide Derivatives Rash  ?  childhood  ? ? ?Social History  ? ?Socioeconomic History  ? Marital status: Married  ?  Spouse name: Remo Lipps  ? Number of children: 1  ? Years of education: 73  ? Highest education level: Associate degree: occupational, Hotel manager, or vocational program  ?Occupational History  ? Occupation: Retired  ?  Employer: RETIRED  ?  Comment: 1  ?Tobacco Use  ? Smoking status: Former  ?  Packs/day: 2.00  ?  Years: 20.00  ?  Pack years: 40.00  ?  Types: Cigarettes  ?  Quit date: 08/07/1983  ?  Years since quitting: 38.2  ?  Passive exposure: Past  ? Smokeless tobacco: Never  ? Tobacco comments:  ?  quit over 40 years ago  ?Vaping Use  ? Vaping Use: Never used  ?Substance and Sexual Activity  ? Alcohol use: Yes  ?  Alcohol/week: 7.0 standard drinks  ?  Types: 7 Shots of liquor per week  ?  Comment: 1 at night before bed 09/28/21  ? Drug use: No  ? Sexual activity: Yes  ?  Partners: Female  ?Other Topics Concern  ? Not on file  ?Social History Narrative  ? Married and lives with wife 1974  ? One daughter, not local  ? Retired from [employer], sea based telecommunication/contracting  ? ?Social Determinants of Health  ? ?Financial Resource Strain: Low Risk   ? Difficulty of Paying Living Expenses: Not hard at all  ?Food Insecurity: No Food Insecurity  ? Worried About Charity fundraiser in the Last Year: Never true  ? Ran Out of Food in the Last Year: Never true  ?Transportation Needs: No Transportation Needs  ? Lack of Transportation (Medical): No  ? Lack of Transportation (Non-Medical): No  ?Physical Activity: Insufficiently Active  ? Days of Exercise per Week: 7 days  ? Minutes of Exercise per  Session: 10 min  ?Stress: No Stress Concern Present  ? Feeling of Stress : Not at all  ?Social Connections: Moderately Integrated  ? Frequency of Communication with Friends and Family: Three times a week  ? Frequency of Social Gatherings with Friends and Family: Once a week  ? Attends Religious Services: Never  ? Active Member of Clubs or Organizations: Yes  ? Attends Archivist Meetings: Never  ? Marital Status: Married  ?Intimate Partner Violence: Not At Risk  ? Fear of Current or Ex-Partner: No  ? Emotionally Abused: No  ? Physically Abused: No  ? Sexually Abused: No  ? ? ?Family History  ?Problem Relation Age of Onset  ? Stroke Mother   ?  Lung cancer Maternal Grandfather   ?     smoker  ? Stroke Maternal Grandfather   ? Heart disease Paternal Grandfather   ?     MI, old age  ? Colon cancer Neg Hx   ? Colon polyps Neg Hx   ? Esophageal cancer Neg Hx   ? Rectal cancer Neg Hx   ? Stomach cancer Neg Hx   ? Bladder Cancer Neg Hx   ? Prostate cancer Neg Hx   ? ? ?ROS- All systems are reviewed and negative except as per the HPI above ? ?Physical Exam: ?Vitals:  ?  11/07/21 1059  ?Weight: 120.8 kg  ?Height: 6' (1.829 m)  ?  ?   ?Wt Readings from Last 3 Encounters:  ?11/07/21 120.8 kg  ?10/25/21 119.3 kg  ?10/05/21 124.3 kg  ? ? ?Wt Readings from Last 3 Encounters:  ?11/07/21 120.8 kg  ?10/25/21 119.3 kg  ?10/05/21 124.3 kg  ? ? ?Labs: ?Lab Results  ?Component Value Date  ? NA 141 11/07/2021  ? K 4.5 11/07/2021  ? CL 108 11/07/2021  ? CO2 27 11/07/2021  ? GLUCOSE 81 11/07/2021  ? BUN 19 11/07/2021  ? CREATININE 1.39 (H) 11/07/2021  ? CALCIUM 9.5 11/07/2021  ? MG 2.0 11/07/2021  ? ?Lab Results  ?Component Value Date  ? INR 1.31 05/09/2018  ? ?Lab Results  ?Component Value Date  ? CHOL 126 05/25/2021  ? HDL 48 05/25/2021  ? [place] 63 05/25/2021  ? TRIG 76 05/25/2021  ? ? ? ?GEN- The patient is well appearing, alert and oriented x 3 today.   ?Head- normocephalic, atraumatic ?Eyes-  Sclera clear, conjunctiva  pink ?Ears- hearing intact ?Oropharynx- clear ?Neck- supple, no JVP ?Lymph- no cervical lymphadenopathy ?Lungs- Clear to ausculation bilaterally, normal work of breathing ?Heart- regular rate and rhythm, no murmurs,

## 2021-11-07 NOTE — TOC Benefit Eligibility Note (Signed)
Patient Advocate Encounter ? ?Insurance verification completed.   ? ?The patient is currently admitted and upon discharge could be taking dofetilide (Tikosyn) 500 mcg capsules. ? ?The current 30 day co-pay is, $90.00.  ? ?The patient is insured through [facility] Part D  ? ? ? ?Lyndel Safe, CPhT ?Pharmacy Patient Advocate Specialist ?[facility] Patient Advocate Team ?Direct Number: [phone]  Fax: [phone] ? ? ? ? ? ?  ?

## 2021-11-07 NOTE — Progress Notes (Signed)
? ?Primary Care Physician: Tonia Ghent, MD ?Referring Physician:Dr. Quentin Ore ? ? ?James [name] is a 76 y.o. male with a h/o HTN, CHF, CAD that saw Dr. Quentin Ore  last November as he wanted to come off amiodarone for potential side effects and wanted other options to maintain SR. Dr. Quentin Ore did not feel he was a good ablation candidate and felt Tikosyn was the best   option, ? ?Now after allowing some time for amiodarone washout to permit loading of Tikosyn, he is here today to discuss Tikosyn admit. Amiodarone level done today.  ? ?Afib clinic, 4/423. Pt is here for Tikosyn admit. He has been off Tikosyn since last November. He is in SR with acceptable qt. No missed anticoagulation.No drugs that are contraindicated with Tikosyn. No Benadryl use. Will be getting drug thru good rx. ? ?Today, he denies symptoms of palpitations, chest pain, shortness of breath, orthopnea, PND, lower extremity edema, dizziness, presyncope, syncope, or neurologic sequela. The patient is tolerating medications without difficulties and is otherwise without complaint today.  ? ?Past Medical History:  ?Diagnosis Date  ? (HFpEF) heart failure with preserved ejection fraction ([place])   ? a. 05/2018 Echo: EF 55-60%, gr2 DD.  ? Acute lower GI bleeding after colonoscopy and polypectomy, resolved 12/12/2015  ? Ascending aortic aneurysm   ? a. 02/2018 Echo: mildly dil Ao root/asc ao/arch; b. 05/2018 CTA Chest: 4.7cm fusiform aneurysm of Asc Ao.  ? CAD (coronary artery disease)   ? a. 05/2018 Cath/PCI: LM nl,LAD 30p, 90/99d/apical, LCX 31m OM1/2/3 min irregs, RCA 85p (3.[place]x18 [place].  ? Cardiac murmur   ? a. 02/2018 Echo: EF 60-65%, no rwma, mild AI, mildly dil Ao root/Asc Ao/Arch, Mild MR, mildly dil LA. Nl RV fxn.  ? Cataract   ? bilateral surgery to remove  ? CHF (congestive heart failure) ([place]   ? Colon polyp   ? Constipation   ? Essential hypertension   ? Hemorrhoids   ? Hepatitis   ? self resolved, likely food exposure, 1974.   ?  History of cardiovascular stress test   ? a. 02/2018 Myoview: EF 55-65%, small/mild apical defect w/ nl wall motion ->attenuation artifact. No ischemia. Low risk study.  ? Hx of adenomatous colonic polyps 12/05/2015  ? Hyperlipidemia   ? Hypothyroidism   ? Mitral regurgitation   ? a. 110/2019 Echo: EF 55-60%. Gr2 DD. Triv AI. Ao root 353m Mild MR. Mod dil LA, mildly dil RA.  ? Osteoarthritis   ? Persistent atrial fibrillation ([place]  ? a. Dx 02/2018. CHA2DS2VASc = 2-->Eliquis; b. 03/2018 DCCV (200J); c. 03/2018 recurrent Afib-->flecainide started 04/2018;  d. 05/06/2018 s/p successful DCCV - 200J x 2-->on amio.  ? Skin cancer   ? basal cell, R ear, MOHS  ? ?Past Surgical History:  ?Procedure Laterality Date  ? BROW LIFT Bilateral 10/23/2016  ? Procedure: BLEPHAROPLASTY upper eyelid with excess skin;  Surgeon: AmKarle StarchMD;  [place] Service: Ophthalmology;  Laterality: Bilateral;  MAC  ? CARDIOVERSION N/A 03/18/2018  ? Procedure: CARDIOVERSION;  Surgeon: EnNelva BushMD;  Location: [facility];  Service: Cardiovascular;  Laterality: N/A;  ? CARDIOVERSION N/A 05/06/2018  ? Procedure: CARDIOVERSION;  Surgeon: EnNelva BushMD;  Location: [facility];  Service: Cardiovascular;  Laterality: N/A;  ? CATARACT EXTRACTION  1986  ? OD  ? CATARACT EXTRACTION Bilateral 1995  ? x 2 for right and left   ? COLONOSCOPY    ? CORONARY STENT INTERVENTION N/A  05/13/2018  ? Procedure: CORONARY STENT INTERVENTION;  Surgeon: Nelva Bush, MD;  Location: [facility];  Service: Cardiovascular;  Laterality: N/A;  ? ECTROPION REPAIR Bilateral 10/23/2016  ? Procedure: REPAIR OF ECTROPION sutures, extensive;  Surgeon: Karle Starch, MD;  Location: [facility];  Service: Ophthalmology;  Laterality: Bilateral;  ? LIPOMA EXCISION  06/1997  ? left neck ([place])  ? MOHS SURGERY Right 2013  ? behind right ear  ? RIGHT/LEFT HEART CATH AND CORONARY ANGIOGRAPHY N/A 05/12/2018  ? Procedure: RIGHT/LEFT HEART CATH  AND CORONARY ANGIOGRAPHY;  Surgeon: Wellington Hampshire, MD;  Location: [facility];  Service: Cardiovascular;  Laterality: N/A;  ? TONSILLECTOMY    ? VASECTOMY  1982  ? ? ?Current Outpatient Medications  ?Medication Sig Dispense Refill  ? apixaban (ELIQUIS) 5 MG TABS tablet TAKE 1 TABLET(5 MG) BY MOUTH TWICE DAILY 180 tablet 1  ? atorvastatin (LIPITOR) 80 MG tablet TAKE 1 TABLET BY MOUTH DAILY AT 6 PM 90 tablet 0  ? diclofenac Sodium (VOLTAREN) 1 % GEL Apply 2 g topically 4 (four) times daily as needed.    ? doxazosin (CARDURA) 4 MG tablet TAKE 1 TABLET BY MOUTH DAILY AFTER BREAKFAST 90 tablet 0  ? ezetimibe (ZETIA) 10 MG tablet TAKE 1 TABLET BY MOUTH DAILY. 90 tablet 3  ? finasteride (PROSCAR) 5 MG tablet TAKE 1 TABLET BY MOUTH EVERY DAY 90 tablet 3  ? furosemide (LASIX) 20 MG tablet Take 1 tablet (20 mg total) by mouth every other day 45 tablet 1  ? ibuprofen (ADVIL) 200 MG tablet Take 400 mg by mouth as needed for moderate pain.    ? levothyroxine (SYNTHROID) 150 MCG tablet Take 1 tablet (150 mcg total) by mouth daily before breakfast. Except take 1/2 tablet on Sundays.  Total of 6.5 tablets in 1 week. 100 tablet 1  ? Multiple Vitamin (MULTIVITAMIN WITH MINERALS) TABS tablet Take 1 tablet by mouth daily. Senior Multivitamin    ? omeprazole (PRILOSEC) 20 MG capsule Take 1 capsule (20 mg total) by mouth daily. 30 capsule 0  ? polyethylene glycol (MIRALAX) 17 g packet Take 17 g by mouth 2 (two) times daily. (Patient taking differently: Take 17 g by mouth as needed.) 14 each 0  ? psyllium (METAMUCIL) 58.6 % packet Take 1 packet by mouth daily.    ? ramipril (ALTACE) 10 MG capsule TAKE 1 CAPSULE BY MOUTH DAILY GENERIC EQUIVALENT FOR ALTACE 90 capsule 3  ? White Petrolatum-Mineral Oil (REFRESH P.M. OP) Place 1 drop into both eyes at bedtime as needed (dry eyes).    ? ?No current facility-administered medications for this encounter.  ? ? ?Allergies  ?Allergen Reactions  ? Sulfonamide Derivatives Rash  ?   childhood  ? ? ?Social History  ? ?Socioeconomic History  ? Marital status: Married  ?  Spouse name: Remo Lipps  ? Number of children: 1  ? Years of education: 101  ? Highest education level: Associate degree: occupational, Hotel manager, or vocational program  ?Occupational History  ? Occupation: Retired  ?  Employer: RETIRED  ?  Comment: 1  ?Tobacco Use  ? Smoking status: Former  ?  Packs/day: 2.00  ?  Years: 20.00  ?  Pack years: 40.00  ?  Types: Cigarettes  ?  Quit date: 08/07/1983  ?  Years since quitting: 38.2  ?  Passive exposure: Past  ? Smokeless tobacco: Never  ? Tobacco comments:  ?  quit over 40 years ago  ?Vaping Use  ?  Vaping Use: Never used  ?Substance and Sexual Activity  ? Alcohol use: Yes  ?  Alcohol/week: 7.0 standard drinks  ?  Types: 7 Shots of liquor per week  ?  Comment: 1 at night before bed 09/28/21  ? Drug use: No  ? Sexual activity: Yes  ?  Partners: Female  ?Other Topics Concern  ? Not on file  ?Social History Narrative  ? Married and lives with wife 1974  ? One daughter, not local  ? Retired from [employer], sea based telecommunication/contracting  ? ?Social Determinants of Health  ? ?Financial Resource Strain: Low Risk   ? Difficulty of Paying Living Expenses: Not hard at all  ?Food Insecurity: No Food Insecurity  ? Worried About Charity fundraiser in the Last Year: Never true  ? Ran Out of Food in the Last Year: Never true  ?Transportation Needs: No Transportation Needs  ? Lack of Transportation (Medical): No  ? Lack of Transportation (Non-Medical): No  ?Physical Activity: Insufficiently Active  ? Days of Exercise per Week: 7 days  ? Minutes of Exercise per Session: 10 min  ?Stress: No Stress Concern Present  ? Feeling of Stress : Not at all  ?Social Connections: Moderately Integrated  ? Frequency of Communication with Friends and Family: Three times a week  ? Frequency of Social Gatherings with Friends and Family: Once a week  ? Attends Religious Services: Never  ? Active Member of Clubs or  Organizations: Yes  ? Attends Archivist Meetings: Never  ? Marital Status: Married  ?Intimate Partner Violence: Not At Risk  ? Fear of Current or Ex-Partner: No  ? Emotionally Abused: No  ? Physically Abused: No  ?

## 2021-11-08 ENCOUNTER — Ambulatory Visit: Payer: Medicare Other | Admitting: Urology

## 2021-11-08 LAB — BASIC METABOLIC PANEL
Anion gap: 4 — ABNORMAL LOW (ref 5–15)
BUN: 20 mg/dL (ref 8–23)
CO2: 25 mmol/L (ref 22–32)
Calcium: 8.7 mg/dL — ABNORMAL LOW (ref 8.9–10.3)
Chloride: 111 mmol/L (ref 98–111)
Creatinine, Ser: 1.71 mg/dL — ABNORMAL HIGH (ref 0.61–1.24)
GFR, Estimated: 41 mL/min — ABNORMAL LOW (ref >60)
Glucose, Bld: 104 mg/dL — ABNORMAL HIGH (ref 70–99)
Potassium: 3.8 mmol/L (ref 3.5–5.1)
Sodium: 140 mmol/L (ref 135–145)

## 2021-11-08 LAB — MAGNESIUM: Magnesium: 2 mg/dL (ref 1.7–2.4)

## 2021-11-08 MED ORDER — POTASSIUM CHLORIDE CRYS ER 20 MEQ PO TBCR
40.0000 meq | EXTENDED_RELEASE_TABLET | Freq: Once | ORAL | Status: AC
  Administered 2021-11-08: 40 meq via ORAL
  Filled 2021-11-08: qty 2

## 2021-11-08 NOTE — Care Management (Signed)
?  Transition of Care (TOC) Screening Note ? ? ?Patient Details  ?Name: James [name] ?Date of Birth: 1946/02/12 ? ? ?Transition of Care (TOC) CM/SW Contact:    ?Graves-Bigelow, Ocie Cornfield, RN ?Phone Number: ?11/08/2021, 12:22 PM ? ? ? ?Transition of Care Department [facility]) has reviewed the patient. Case Manager discussed Tikosyn cost with the patient and he states the cost is expensive. Case Manager assisted patient with placing the Good Rx App on his smart phone. Patient would like for the initial Rx to be escribed to [place] and Rx refills for 90 day supply to be sent to [place], [place]. No further needs from Case Manager at this time.  ?

## 2021-11-08 NOTE — Progress Notes (Signed)
Morning EKG reviewed   ? ?Shows remains in NSR at 54 bpm with stable QTc at ~450-460 ms. ? ?Continue  Tikosyn 500 mcg BID.  ? ?Pt will not require DCCV  ? ?Shirley Friar, PA-C  ?Pager: [phone]  ?11/08/2021 11:07 AM  ? ?

## 2021-11-08 NOTE — Progress Notes (Signed)
Pharmacy: Dofetilide (Tikosyn) - Follow Up ?Assessment and Electrolyte Replacement ? ?Pharmacy consulted to assist in monitoring and replacing electrolytes in this 76 y.o. male admitted on 11/07/2021 undergoing dofetilide initiation. First dofetilide dose: 11/07/21 PM  ? ?Labs: ?   ?Component Value Date/Time  ? K 3.8 11/08/2021 0221  ? MG 2.0 11/08/2021 0221  ?  ? ?Plan: ?Potassium: ?K 3.8-3.9:  Give KCl 40 mEq po x1  ? ?Magnesium: ?Mg > 2: No additional supplementation needed ? ?  ? ?Thank you for allowing pharmacy to participate in this patient's care  ? ?Benetta Spar, PharmD, BCPS, BCCP ?Clinical Pharmacist ? ?Please check AMION for all [place] phone numbers ?After 10:00 PM, call [place] [phone] ? ?

## 2021-11-08 NOTE — Progress Notes (Signed)
? ?Electrophysiology Rounding Note ? ?Patient Name: James [name] ?Date of Encounter: 11/08/2021 ? ?Primary Cardiologist: Nelva Bush, MD  ?Electrophysiologist: Vickie Epley, MD  ? ? ?Subjective  ? ?Pt  remains in NSR  on Tikosyn 500 mcg BID  ? ?QTc from EKG last pm shows stable QTc at ~450 ? ?The patient is doing well today.  At this time, the patient denies chest pain, shortness of breath, or any new concerns. ? ?Inpatient Medications  ?  ?Scheduled Meds: ? apixaban  5 mg Oral BID  ? atorvastatin  80 mg Oral Daily  ? dofetilide  500 mcg Oral BID  ? doxazosin  4 mg Oral Daily  ? ezetimibe  10 mg Oral Daily  ? finasteride  5 mg Oral Daily  ? [START ON 11/09/2021] furosemide  20 mg Oral QODAY  ? levothyroxine  150 mcg Oral Q0600  ? pantoprazole  40 mg Oral Daily  ? ramipril  10 mg Oral Daily  ? sodium chloride flush  3 mL Intravenous Q12H  ? ?Continuous Infusions: ? sodium chloride    ? ?PRN Meds: ?sodium chloride, sodium chloride flush  ? ?Vital Signs  ?  ?Vitals:  ? 11/07/21 1619 11/07/21 1958 11/07/21 2302 11/08/21 0514  ?BP: (!) 145/91 (!) 156/89 (!) 143/84 (!) 155/82  ?Pulse: 92 83 63 (!) 55  ?Resp: '18 18 18 18  '$ ?Temp: 97.9 ?F (36.6 ?C) 97.9 ?F (36.6 ?C) 98.7 ?F (37.1 ?C) 98.5 ?F (36.9 ?C)  ?TempSrc: Oral Oral Oral Oral  ?SpO2:  97% 98% 98%  ?Weight: 120.8 kg     ?Height: 6' (1.829 m)     ? ?No intake or output data in the 24 hours ending 11/08/21 0703 ?Filed Weights  ? 11/07/21 1619  ?Weight: 120.8 kg  ? ? ?Physical Exam  ?  ?GEN- The patient is well appearing, alert and oriented x 3 today.   ?Head- normocephalic, atraumatic ?Eyes-  Sclera clear, conjunctiva pink ?Ears- hearing intact ?Oropharynx- clear ?Neck- supple ?Lungs- Clear to ausculation bilaterally, normal work of breathing ?Heart- Regular rate and rhythm, no murmurs, rubs or gallops ?GI- soft, NT, ND, + BS ?Extremities- no clubbing, cyanosis, or edema ?Skin- no rash or lesion ?Psych- euthymic mood, full affect ?Neuro- strength and sensation are  intact ? ?Labs  ?  ?CBC ?No results for input(s): WBC, NEUTROABS, HGB, HCT, MCV, PLT in the last 72 hours. ?Basic Metabolic Panel ?Recent Labs  ?  11/07/21 ?1118 11/08/21 ?0221  ?NA 141 140  ?K 4.5 3.8  ?CL 108 111  ?CO2 27 25  ?GLUCOSE 81 104*  ?BUN 19 20  ?CREATININE 1.39* 1.71*  ?CALCIUM 9.5 8.7*  ?MG 2.0 2.0  ? ? ?Potassium  ?Date/Time Value Ref Range Status  ?11/08/2021 02:21 AM 3.8 3.5 - 5.1 mmol/L Final  ? ?Magnesium  ?Date/Time Value Ref Range Status  ?11/08/2021 02:21 AM 2.0 1.7 - 2.4 mg/dL Final  ?  Comment:  ?  Performed at [facility], [place] [address] [place], [place] [address]  ? ? ?Telemetry  ?  ?Sinus bradycardia 40-50s (personally reviewed) ? ?Radiology  ?  ?MR Pelvis w/o contrast ? ?Result Date: 11/06/2021 ?CLINICAL DATA:  Evaluate occult osteoarthritis of right hip. EXAM: MRI PELVIS WITHOUT CONTRAST TECHNIQUE: Multiplanar multisequence MR imaging of the pelvis was performed. No intravenous contrast was administered. COMPARISON:  Radiograph dated November 01, 2021 FINDINGS: Bones: No hip fracture, dislocation or avascular necrosis. No periosteal reaction or bone destruction. No aggressive osseous lesion.  Normal sacrum and sacroiliac joints. No SI joint widening or erosive changes. Articular cartilage and labrum Articular cartilage: Generalized articular cartilage thinning without evidence of full-thickness defect. Labrum: Grossly intact, but evaluation is limited by lack of intraarticular fluid. Joint or bursal effusion Joint effusion:  No hip joint effusion.  No SI joint effusion. Bursae:  No bursa formation. Muscles and tendons Minimal edema about the origin of the iliacus muscle bilaterally. Iliopsoas tendon is intact. The muscles of the flexor, extensor and adductor compartments are intact. Mild tendinopathy of the hamstring origin without evidence of tear. Other findings No pelvic free fluid. No fluid collection or hematoma. No inguinal lymphadenopathy. No inguinal hernia. IMPRESSION: 1.  No  evidence of fracture or dislocation. 2. Mild articular cartilage thinning without evidence of significant osteoarthritis. No appreciable joint effusion. 3. Mild edema about the origin of the iliacus muscle as well as mild hamstring tendinopathy. No appreciable tendon tear. Electronically Signed   By: Keane Police D.O.   On: 11/06/2021 11:13   ? ? ?Patient Profile  ?   ?James [name] is a 76 y.o. male with a past medical history significant for persistent atrial fibrillation.  They were admitted for tikosyn load.  ? ?Assessment & Plan  ?  ?Persistent atrial fibrillation ?Pt  remains in NSR  on Tikosyn 500 mcg BID  ?Continue Eliquis ?K 3.8 and Mg 2.0. supp per protocol ?CHA2DS2VASC is at least 4. ? ?Patient will not likely require cardioversion. Plan for home Friday if QT remains stable. ? ? ?For questions or updates, please contact [place] ?Please consult [email or web address] for contact info under Cardiology/STEMI. ? ?Signed, ?Shirley Friar, PA-C  ?11/08/2021, 7:03 AM  ? ?

## 2021-11-08 NOTE — Progress Notes (Signed)
?  11/08/21 1530  ?Clinical Encounter Type  ?Visited With Patient ?([name])  ?Visit Type Initial;Other (Comment) ?(Patient request information for Advance Directive: [place])  ?Referral From Nurse ?(Aisha C. Koleen Nimrod, Therapist, sports)  ?Consult/Referral To Chaplain  ?Advance Directives (For Healthcare)  ?Does Patient Have a Medical Advance Directive? Yes  ?Mental Health Advance Directives  ?Does Patient Have a Mental Health Advance Directive? No  ? ?Met with Mr. [name] at patient's bedside regarding questions he had relating to the [facility] Instruction for [place]. Chaplain provided Mr. Lantigua with blank copy of the [place] and instructions for completion. Mr. Tomich plans to take it home and discuss with his wife and daughter. Chaplain advised Mr. Lott that after completing he should then scan into his medical record or take to his physician office who may scan it in for him. ? ?Mr. Reek and I also reviewed his Medical Advance Directive: [place] that was completed and placed on file 10/06/2013. Mr. Bhalla stated that there are no updates to that H.C.P.O.A. or Living Will necessary at this time. [address] [place], [place] [phone]. ?

## 2021-11-09 LAB — BASIC METABOLIC PANEL WITH GFR
Anion gap: 5 (ref 5–15)
BUN: 22 mg/dL (ref 8–23)
CO2: 25 mmol/L (ref 22–32)
Calcium: 8.8 mg/dL — ABNORMAL LOW (ref 8.9–10.3)
Chloride: 111 mmol/L (ref 98–111)
Creatinine, Ser: 1.42 mg/dL — ABNORMAL HIGH (ref 0.61–1.24)
GFR, Estimated: 52 mL/min — ABNORMAL LOW
Glucose, Bld: 74 mg/dL (ref 70–99)
Potassium: 5 mmol/L (ref 3.5–5.1)
Sodium: 141 mmol/L (ref 135–145)

## 2021-11-09 LAB — MAGNESIUM: Magnesium: 2.1 mg/dL (ref 1.7–2.4)

## 2021-11-09 NOTE — Progress Notes (Signed)
Pharmacy: Dofetilide (Tikosyn) - Follow Up ?Assessment and Electrolyte Replacement ? ?Pharmacy consulted to assist in monitoring and replacing electrolytes in this 76 y.o. male admitted on 11/07/2021 undergoing dofetilide initiation. First dofetilide dose: 11/07/21 ? ?Labs: ?   ?Component Value Date/Time  ? K 5.0 11/09/2021 0354  ? MG 2.1 11/09/2021 0354  ?  ? ?Plan: ?Potassium: ?K >/= 4: No additional supplementation needed ? ?Magnesium: ?Mg > 2: No additional supplementation needed ? ? ?Arrie Senate, PharmD, BCPS, BCCP ?Clinical Pharmacist ?[phone] ?Please check AMION for all [place] numbers ?11/09/2021 ? ?

## 2021-11-09 NOTE — Progress Notes (Signed)
Morning EKG reviewed   ? ?Shows remains in NSR at 53 bpm with stable QTc at ~450-460 ms. ? ?Continue  Tikosyn 500 mcg BID.  ? ?Plan for home tomorrow if QTc remains stable.  ? ?Shirley Friar, PA-C  ?Pager: [phone]  ?11/09/2021 12:17 PM  ? ?

## 2021-11-09 NOTE — Care Management Important Message (Signed)
Important Message ? ?Patient Details  ?Name: James [name] ?MRN: [id number] ?Date of Birth: 1946-07-14 ? ? ?Medicare Important Message Given:  Yes ? ? ? ? ?[name] ?11/09/2021, 9:07 AM ?

## 2021-11-09 NOTE — Progress Notes (Signed)
? ?Electrophysiology Rounding Note ? ?Patient Name: James [name] ?Date of Encounter: 11/09/2021 ? ?Primary Cardiologist: Nelva Bush, MD  ?Electrophysiologist: Vickie Epley, MD  ? ? ?Subjective  ? ?Pt  remains in NSR  on Tikosyn 500 mcg BID  ? ?QTc from EKG last pm shows stable QTc at ~460-470 ? ?The patient is doing well today.  At this time, the patient denies chest pain, shortness of breath, or any new concerns. ? ?Inpatient Medications  ?  ?Scheduled Meds: ? apixaban  5 mg Oral BID  ? atorvastatin  80 mg Oral Daily  ? dofetilide  500 mcg Oral BID  ? doxazosin  4 mg Oral Daily  ? ezetimibe  10 mg Oral Daily  ? finasteride  5 mg Oral Daily  ? furosemide  20 mg Oral QODAY  ? levothyroxine  150 mcg Oral Q0600  ? pantoprazole  40 mg Oral Daily  ? ramipril  10 mg Oral Daily  ? sodium chloride flush  3 mL Intravenous Q12H  ? ?Continuous Infusions: ? sodium chloride    ? ?PRN Meds: ?sodium chloride, sodium chloride flush  ? ?Vital Signs  ?  ?Vitals:  ? 11/07/21 2302 11/08/21 0514 11/08/21 1948 11/09/21 0618  ?BP: (!) 143/84 (!) 155/82 (!) 160/80 (!) 171/79  ?Pulse: 63 (!) 55  (!) 49  ?Resp: '18 18 18 17  '$ ?Temp: 98.7 ?F (37.1 ?C) 98.5 ?F (36.9 ?C) 97.8 ?F (36.6 ?C) 98.1 ?F (36.7 ?C)  ?TempSrc: Oral Oral Oral Oral  ?SpO2: 98% 98% 97% 98%  ?Weight:      ?Height:      ? ?No intake or output data in the 24 hours ending 11/09/21 0705 ?Filed Weights  ? 11/07/21 1619  ?Weight: 120.8 kg  ? ? ?Physical Exam  ?  ?GEN- The patient is well appearing, alert and oriented x 3 today.   ?Head- normocephalic, atraumatic ?Eyes-  Sclera clear, conjunctiva pink ?Ears- hearing intact ?Oropharynx- clear ?Neck- supple ?Lungs- Clear to ausculation bilaterally, normal work of breathing ?Heart- Regular rate and rhythm, no murmurs, rubs or gallops ?GI- soft, NT, ND, + BS ?Extremities- no clubbing, cyanosis, or edema ?Skin- no rash or lesion ?Psych- euthymic mood, full affect ?Neuro- strength and sensation are intact ? ?Labs  ?  ?CBC ?No  results for input(s): WBC, NEUTROABS, HGB, HCT, MCV, PLT in the last 72 hours. ?Basic Metabolic Panel ?Recent Labs  ?  11/08/21 ?0221 11/09/21 ?0354  ?NA 140 141  ?K 3.8 5.0  ?CL 111 111  ?CO2 25 25  ?GLUCOSE 104* 74  ?BUN 20 22  ?CREATININE 1.71* 1.42*  ?CALCIUM 8.7* 8.8*  ?MG 2.0 2.1  ? ? ?Potassium  ?Date/Time Value Ref Range Status  ?11/09/2021 03:54 AM 5.0 3.5 - 5.1 mmol/L Final  ?  Comment:  ?  [place]TA CHECK NOTED  ? ?Magnesium  ?Date/Time Value Ref Range Status  ?11/09/2021 03:54 AM 2.1 1.7 - 2.4 mg/dL Final  ?  Comment:  ?  Performed at [facility], [place] [address] [place], [place] [address]  ? ? ?Telemetry  ?  ?SB/NSR 50-60s (personally reviewed) ? ?Radiology  ?  ?No results found. ? ? ?Patient Profile  ?   ?James [name] is a 76 y.o. male with a past medical history significant for persistent atrial fibrillation.  They were admitted for tikosyn load.  ? ?Assessment & Plan  ?  ?Persistent atrial fibrillation ?Pt  remains in NSR  on Tikosyn 500 mcg BID  ?  Continue Eliquis ?Electrolytes stable.  ?CHA2DS2VASC is at least 4. ? ?Plan for home tomorrow if QTc remains stable. ? ?For questions or updates, please contact [place] ?Please consult [email or web address] for contact info under Cardiology/STEMI. ? ?Signed, ?Shirley Friar, PA-C  ?11/09/2021, 7:05 AM  ? ?

## 2021-11-10 ENCOUNTER — Other Ambulatory Visit (HOSPITAL_COMMUNITY): Payer: Self-pay

## 2021-11-10 LAB — BASIC METABOLIC PANEL
Anion gap: 5 (ref 5–15)
BUN: 18 mg/dL (ref 8–23)
CO2: 25 mmol/L (ref 22–32)
Calcium: 8.6 mg/dL — ABNORMAL LOW (ref 8.9–10.3)
Chloride: 111 mmol/L (ref 98–111)
Creatinine, Ser: 1.4 mg/dL — ABNORMAL HIGH (ref 0.61–1.24)
GFR, Estimated: 53 mL/min — ABNORMAL LOW (ref >60)
Glucose, Bld: 85 mg/dL (ref 70–99)
Potassium: 3.7 mmol/L (ref 3.5–5.1)
Sodium: 141 mmol/L (ref 135–145)

## 2021-11-10 LAB — MAGNESIUM: Magnesium: 2.1 mg/dL (ref 1.7–2.4)

## 2021-11-10 MED ORDER — POTASSIUM CHLORIDE CRYS ER 20 MEQ PO TBCR
60.0000 meq | EXTENDED_RELEASE_TABLET | Freq: Once | ORAL | Status: AC
  Administered 2021-11-10: 60 meq via ORAL
  Filled 2021-11-10: qty 3

## 2021-11-10 MED ORDER — POTASSIUM CHLORIDE CRYS ER 20 MEQ PO TBCR
20.0000 meq | EXTENDED_RELEASE_TABLET | Freq: Every day | ORAL | 6 refills | Status: DC
  Filled 2021-11-10: qty 30, 30d supply, fill #0

## 2021-11-10 MED ORDER — DOFETILIDE 500 MCG PO CAPS
500.0000 ug | ORAL_CAPSULE | Freq: Two times a day (BID) | ORAL | 6 refills | Status: DC
  Filled 2021-11-10: qty 60, 30d supply, fill #0

## 2021-11-10 NOTE — Discharge Summary (Signed)
? ? ? ?ELECTROPHYSIOLOGY PROCEDURE DISCHARGE SUMMARY  ? ? ?Patient ID: James [name],  ?MRN: [id number], DOB/AGE: [birth year] 76 y.o. ? ?Admit date: 11/07/2021 ?Discharge date: 11/10/2021 ? ?Primary Care Physician: Tonia Ghent, MD  ?Primary Cardiologist: Nelva Bush, MD  ?Electrophysiologist: Vickie Epley, MD  ? ?Primary Discharge Diagnosis:  ?1.  Paroxysmal atrial fibrillation status post Tikosyn loading this admission ? ?Allergies  ?Allergen Reactions  ? Sulfonamide Derivatives Rash  ?  childhood  ? ? ? ?Procedures This Admission:  ?1.  Tikosyn loading ? ?Brief HPI: ?James [name] is a 76 y.o. male with a past medical history as noted above.  They were referred to EP in the outpatient setting for treatment options of atrial fibrillation.  Risks, benefits, and alternatives to Tikosyn were reviewed with the patient who wished to proceed.   ? ?Hospital Course:  ?The patient was admitted and Tikosyn was initiated.  Renal function and electrolytes were followed during the hospitalization.  Their QTc remained stable.  He presented in NSR and did not require cardioversion. On the day of discharge, they were examined by Dr. Quentin Ore  who considered them stable for discharge to home.  Follow-up has been arranged with the Atrial Fibrillation clinic in approximately 1 week and with  Dr. Quentin Ore   in 4 weeks.  ? ?Kdur 20 meq daily added to his regimen and to be rechecked next week.  ? ?Physical Exam: ?Vitals:  ? 11/09/21 1413 11/09/21 2006 11/10/21 0548 11/10/21 0801  ?BP: (!) 155/82 (!) 167/86 (!) 145/79 (!) 146/81  ?Pulse: (!) 53  67   ?Resp: 17 18    ?Temp: 97.8 ?F (36.6 ?C) 97.9 ?F (36.6 ?C) 97.7 ?F (36.5 ?C)   ?TempSrc: Oral Oral Oral   ?SpO2: 97% 99% 99%   ?Weight:      ?Height:      ? ? ?GEN- The patient is well appearing, alert and oriented x 3 today.   ?HEENT: normocephalic, atraumatic; sclera clear, conjunctiva pink; hearing intact; oropharynx clear; neck supple, no JVP ?Lymph- no cervical  lymphadenopathy ?Lungs- Clear to ausculation bilaterally, normal work of breathing.  No wheezes, rales, rhonchi ?Heart- Regular rate and rhythm, no murmurs, rubs or gallops, PMI not laterally displaced ?GI- soft, non-tender, non-distended, bowel sounds present, no hepatosplenomegaly ?Extremities- no clubbing, cyanosis, or edema; DP/PT/radial pulses 2+ bilaterally ?MS- no significant deformity or atrophy ?Skin- warm and dry, no rash or lesion ?Psych- euthymic mood, full affect ?Neuro- strength and sensation are intact ? ? ?Labs: ?  ?Lab Results  ?Component Value Date  ? WBC 9.0 07/08/2021  ? HGB 14.5 07/08/2021  ? HCT 43.2 07/08/2021  ? MCV 95.4 07/08/2021  ? PLT 136 (L) 07/08/2021  ?  ?Recent Labs  ?Lab 11/10/21 ?0211  ?NA 141  ?K 3.7  ?CL 111  ?CO2 25  ?BUN 18  ?CREATININE 1.40*  ?CALCIUM 8.6*  ?GLUCOSE 85  ? ? ? ?Discharge Medications:  ?Allergies as of 11/10/2021   ? ?   Reactions  ? Sulfonamide Derivatives Rash  ? childhood  ? ?  ? ?  ?Medication List  ?  ? ?TAKE these medications   ? ?apixaban 5 MG Tabs tablet ?Commonly known as: Eliquis ?TAKE 1 TABLET(5 MG) BY MOUTH TWICE DAILY ?What changed:  ?how much to take ?how to take this ?when to take this ?additional instructions ?  ?atorvastatin 80 MG tablet ?Commonly known as: LIPITOR ?TAKE 1 TABLET BY MOUTH DAILY AT 6 PM ?What changed: when to  take this ?  ?diclofenac Sodium 1 % Gel ?Commonly known as: VOLTAREN ?Apply 2 g topically 4 (four) times daily as needed. ?What changed: reasons to take this ?  ?dofetilide 500 MCG capsule ?Commonly known as: TIKOSYN ?Take 1 capsule (500 mcg total) by mouth 2 (two) times daily. ?  ?doxazosin 4 MG tablet ?Commonly known as: CARDURA ?TAKE 1 TABLET BY MOUTH DAILY AFTER BREAKFAST ?What changed: See the new instructions. ?  ?ezetimibe 10 MG tablet ?Commonly known as: ZETIA ?TAKE 1 TABLET BY MOUTH DAILY. ?What changed:  ?how much to take ?how to take this ?when to take this ?additional instructions ?  ?finasteride 5 MG  tablet ?Commonly known as: PROSCAR ?TAKE 1 TABLET BY MOUTH EVERY DAY ?  ?furosemide 20 MG tablet ?Commonly known as: LASIX ?Take 1 tablet (20 mg total) by mouth every other day ?What changed:  ?how much to take ?how to take this ?when to take this ?additional instructions ?  ?ibuprofen 200 MG tablet ?Commonly known as: ADVIL ?Take 400 mg by mouth every 6 (six) hours as needed for moderate pain. ?  ?levothyroxine 150 MCG tablet ?Commonly known as: SYNTHROID ?Take 1 tablet (150 mcg total) by mouth daily before breakfast. Except take 1/2 tablet on Sundays.  Total of 6.5 tablets in 1 week. ?What changed:  ?how much to take ?when to take this ?additional instructions ?  ?multivitamin with minerals Tabs tablet ?Take 1 tablet by mouth daily. Senior Multivitamin ?  ?omeprazole 20 MG capsule ?Commonly known as: PRILOSEC ?Take 1 capsule (20 mg total) by mouth daily. ?  ?polyethylene glycol 17 g packet ?Commonly known as: MiraLax ?Take 17 g by mouth 2 (two) times daily. ?What changed:  ?when to take this ?reasons to take this ?  ?potassium chloride SA 20 MEQ tablet ?Commonly known as: KLOR-CON M ?Take 1 tablet (20 mEq total) by mouth daily. ?  ?psyllium 58.6 % packet ?Commonly known as: METAMUCIL ?Take 1 packet by mouth daily. ?  ?ramipril 10 MG capsule ?Commonly known as: ALTACE ?TAKE 1 CAPSULE BY MOUTH DAILY GENERIC EQUIVALENT FOR ALTACE ?What changed: See the new instructions. ?  ?REFRESH P.M. OP ?Place 1 drop into both eyes daily as needed (dry eyes). ?  ? ?  ? ? ?Disposition:  ? ? Follow-up Information   ? ? [facility] up.   ?Specialty: Cardiology ?Why: 4/14 at 1030 for post tikosyn visit ?Contact information: ?[address] ?[id number] mc ?[place] ?[phone] ? ?  ?  ? ?  ?  ? ?  ? ? ?Duration of Discharge Encounter: Greater than 30 minutes including physician time. ? ?Signed, ?Shirley Friar, PA-C  ?11/10/2021 ?10:39 AM ? ? ? ? ?

## 2021-11-10 NOTE — Progress Notes (Signed)
EKG from yesterday evening 11/09/2021 reviewed   ? ?Shows remains in NSR at 51 bpm with stable QTc at ~450-460 ms. ? ?Continue  Tikosyn 500 mcg BID.  ? ?K 3.7. Supp.   Mg 2.1 ? ?Home today if QTc remains stable.    ? ?Shirley Friar, PA-C  ?Pager: [phone]  ?11/10/2021 7:11 AM  ? ?

## 2021-11-15 DIAGNOSIS — Z85820 Personal history of malignant melanoma of skin: Secondary | ICD-10-CM | POA: Diagnosis not present

## 2021-11-15 DIAGNOSIS — Z85828 Personal history of other malignant neoplasm of skin: Secondary | ICD-10-CM | POA: Diagnosis not present

## 2021-11-15 DIAGNOSIS — L439 Lichen planus, unspecified: Secondary | ICD-10-CM | POA: Diagnosis not present

## 2021-11-15 DIAGNOSIS — D485 Neoplasm of uncertain behavior of skin: Secondary | ICD-10-CM | POA: Diagnosis not present

## 2021-11-15 DIAGNOSIS — Z86006 Personal history of melanoma in-situ: Secondary | ICD-10-CM | POA: Diagnosis not present

## 2021-11-16 ENCOUNTER — Other Ambulatory Visit: Payer: Self-pay | Admitting: Family Medicine

## 2021-11-16 DIAGNOSIS — I4891 Unspecified atrial fibrillation: Secondary | ICD-10-CM

## 2021-11-17 ENCOUNTER — Other Ambulatory Visit: Payer: Self-pay | Admitting: Family Medicine

## 2021-11-17 ENCOUNTER — Ambulatory Visit (HOSPITAL_COMMUNITY)
Admit: 2021-11-17 | Discharge: 2021-11-17 | Disposition: A | Discharge status: Home / Self Care | Payer: Medicare Other | Source: Ambulatory Visit | Attending: Nurse Practitioner | Admitting: Nurse Practitioner

## 2021-11-17 ENCOUNTER — Encounter (HOSPITAL_COMMUNITY): Payer: Self-pay | Admitting: Nurse Practitioner

## 2021-11-17 VITALS — BP 138/78 | HR 53 | Ht 72.0 in | Wt 262.8 lb

## 2021-11-17 DIAGNOSIS — Z7901 Long term (current) use of anticoagulants: Secondary | ICD-10-CM | POA: Insufficient documentation

## 2021-11-17 DIAGNOSIS — Z8249 Family history of ischemic heart disease and other diseases of the circulatory system: Secondary | ICD-10-CM | POA: Insufficient documentation

## 2021-11-17 DIAGNOSIS — I110 Hypertensive heart disease with heart failure: Secondary | ICD-10-CM | POA: Diagnosis not present

## 2021-11-17 DIAGNOSIS — D6869 Other thrombophilia: Secondary | ICD-10-CM

## 2021-11-17 DIAGNOSIS — I2510 Atherosclerotic heart disease of native coronary artery without angina pectoris: Secondary | ICD-10-CM | POA: Diagnosis not present

## 2021-11-17 DIAGNOSIS — R001 Bradycardia, unspecified: Secondary | ICD-10-CM | POA: Insufficient documentation

## 2021-11-17 DIAGNOSIS — I5032 Chronic diastolic (congestive) heart failure: Secondary | ICD-10-CM | POA: Insufficient documentation

## 2021-11-17 DIAGNOSIS — Z79899 Other long term (current) drug therapy: Secondary | ICD-10-CM | POA: Diagnosis not present

## 2021-11-17 DIAGNOSIS — I4891 Unspecified atrial fibrillation: Secondary | ICD-10-CM

## 2021-11-17 DIAGNOSIS — I480 Paroxysmal atrial fibrillation: Secondary | ICD-10-CM

## 2021-11-17 DIAGNOSIS — I4819 Other persistent atrial fibrillation: Secondary | ICD-10-CM | POA: Insufficient documentation

## 2021-11-17 LAB — BASIC METABOLIC PANEL
Anion gap: 5 (ref 5–15)
BUN: 22 mg/dL (ref 8–23)
CO2: 25 mmol/L (ref 22–32)
Calcium: 9.3 mg/dL (ref 8.9–10.3)
Chloride: 111 mmol/L (ref 98–111)
Creatinine, Ser: 1.35 mg/dL — ABNORMAL HIGH (ref 0.61–1.24)
GFR, Estimated: 55 mL/min — ABNORMAL LOW (ref >60)
Glucose, Bld: 74 mg/dL (ref 70–99)
Potassium: 4.6 mmol/L (ref 3.5–5.1)
Sodium: 141 mmol/L (ref 135–145)

## 2021-11-17 LAB — MAGNESIUM: Magnesium: 2.1 mg/dL (ref 1.7–2.4)

## 2021-11-17 MED ORDER — DOFETILIDE 500 MCG PO CAPS: 500.0000 ug | ORAL_CAPSULE | Freq: Two times a day (BID) | ORAL | 2 refills | Status: DC

## 2021-11-17 NOTE — Progress Notes (Signed)
? ?Primary Care Physician: Tonia Ghent, MD ?Referring Physician:Dr. Quentin Ore ? ? ?James [name] is a 76 y.o. male with a h/o HTN, CHF, CAD that saw Dr. Quentin Ore  last November as he wanted to come off amiodarone for potential side effects and wanted other options to maintain SR. Dr. Quentin Ore did not feel he was a good ablation candidate and felt Tikosyn was the best   option, ? ?Now after allowing some time for amiodarone washout to permit loading of Tikosyn, he is here today to discuss Tikosyn admit. Amiodarone level done today.  ? ?Afib clinic, 11/07/21. Pt is here for Tikosyn admit. He has been off Tikosyn since last November. He is in SR with acceptable qt. No missed anticoagulation.No drugs that are contraindicated with Tikosyn. No Benadryl use. Will be getting drug thru good rx. ? ?F/u 11/17/21, he is in one week s/p tikosyn admit. He is in SR with stable qt. No issues to report.  ? ?Today, he denies symptoms of palpitations, chest pain, shortness of breath, orthopnea, PND, lower extremity edema, dizziness, presyncope, syncope, or neurologic sequela. The patient is tolerating medications without difficulties and is otherwise without complaint today.  ? ?Past Medical History:  ?Diagnosis Date  ? (HFpEF) heart failure with preserved ejection fraction ([place])   ? a. 05/2018 Echo: EF 55-60%, gr2 DD.  ? Acute lower GI bleeding after colonoscopy and polypectomy, resolved 12/12/2015  ? Ascending aortic aneurysm ([place])   ? a. 02/2018 Echo: mildly dil Ao root/asc ao/arch; b. 05/2018 CTA Chest: 4.7cm fusiform aneurysm of Asc Ao.  ? CAD (coronary artery disease)   ? a. 05/2018 Cath/PCI: LM nl,LAD 30p, 90/99d/apical, LCX 52m OM1/2/3 min irregs, RCA 85p (3.5x18 [place].  ? Cardiac murmur   ? a. 02/2018 Echo: EF 60-65%, no rwma, mild AI, mildly dil Ao root/Asc Ao/Arch, Mild MR, mildly dil LA. Nl RV fxn.  ? Cataract   ? bilateral surgery to remove  ? CHF (congestive heart failure) ([facility]   ? Colon polyp   ? Constipation   ?  Essential hypertension   ? Hemorrhoids   ? Hepatitis   ? self resolved, likely food exposure, 1974.   ? History of cardiovascular stress test   ? a. 02/2018 Myoview: EF 55-65%, small/mild apical defect w/ nl wall motion ->attenuation artifact. No ischemia. Low risk study.  ? Hx of adenomatous colonic polyps 12/05/2015  ? Hyperlipidemia   ? Hypothyroidism   ? Mitral regurgitation   ? a. 110/2019 Echo: EF 55-60%. Gr2 DD. Triv AI. Ao root 361m Mild MR. Mod dil LA, mildly dil RA.  ? Osteoarthritis   ? Persistent [place] fibrillation ([place]  ? a. Dx 02/2018. CHA2DS2VASc = 2-->Eliquis; b. 03/2018 DCCV (200J); c. 03/2018 recurrent Afib-->flecainide started 04/2018;  d. 05/06/2018 s/p successful DCCV - 200J x 2-->on amio.  ? Skin cancer   ? basal cell, R ear, MOHS  ? ?Past Surgical History:  ?Procedure Laterality Date  ? BROW LIFT Bilateral 10/23/2016  ? Procedure: BLEPHAROPLASTY upper eyelid with excess skin;  Surgeon: AmKarle StarchMD;  [facility] Service: Ophthalmology;  Laterality: Bilateral;  MAC  ? CARDIOVERSION N/A 03/18/2018  ? Procedure: CARDIOVERSION;  Surgeon: EnNelva BushMD;  Location: [facility];  Service: Cardiovascular;  Laterality: N/A;  ? CARDIOVERSION N/A 05/06/2018  ? Procedure: CARDIOVERSION;  Surgeon: EnNelva BushMD;  Location: [facility];  Service: Cardiovascular;  Laterality: N/A;  ? CATARACT EXTRACTION  1986  ? OD  ?  CATARACT EXTRACTION Bilateral 1995  ? x 2 for right and left   ? COLONOSCOPY    ? CORONARY STENT INTERVENTION N/A 05/13/2018  ? Procedure: CORONARY STENT INTERVENTION;  Surgeon: Nelva Bush, MD;  Location: [facility];  Service: Cardiovascular;  Laterality: N/A;  ? ECTROPION REPAIR Bilateral 10/23/2016  ? Procedure: REPAIR OF ECTROPION sutures, extensive;  Surgeon: Karle Starch, MD;  Location: [facility];  Service: Ophthalmology;  Laterality: Bilateral;  ? LIPOMA EXCISION  06/1997  ? left neck ([place])  ? MOHS SURGERY Right 2013  ? behind right  ear  ? RIGHT/LEFT HEART CATH AND CORONARY ANGIOGRAPHY N/A 05/12/2018  ? Procedure: RIGHT/LEFT HEART CATH AND CORONARY ANGIOGRAPHY;  Surgeon: Wellington Hampshire, MD;  Location: [facility];  Service: Cardiovascular;  Laterality: N/A;  ? TONSILLECTOMY    ? VASECTOMY  1982  ? ? ?Current Outpatient Medications  ?Medication Sig Dispense Refill  ? apixaban (ELIQUIS) 5 MG TABS tablet TAKE 1 TABLET(5 MG) BY MOUTH TWICE DAILY 180 tablet 1  ? atorvastatin (LIPITOR) 80 MG tablet TAKE 1 TABLET BY MOUTH DAILY AT 6 PM 90 tablet 0  ? diclofenac Sodium (VOLTAREN) 1 % GEL Apply 2 g topically 4 (four) times daily as needed.    ? doxazosin (CARDURA) 4 MG tablet TAKE 1 TABLET BY MOUTH DAILY AFTER BREAKFAST 90 tablet 0  ? ezetimibe (ZETIA) 10 MG tablet TAKE 1 TABLET BY MOUTH DAILY. 90 tablet 3  ? finasteride (PROSCAR) 5 MG tablet TAKE 1 TABLET BY MOUTH EVERY DAY 90 tablet 3  ? furosemide (LASIX) 20 MG tablet Take 1 tablet (20 mg total) by mouth every other day 45 tablet 1  ? ibuprofen (ADVIL) 200 MG tablet Take 400 mg by mouth every 6 (six) hours as needed for moderate pain.    ? levothyroxine (SYNTHROID) 150 MCG tablet Take 1 tablet (150 mcg total) by mouth daily before breakfast. Except take 1/2 tablet on Sundays.  Total of 6.5 tablets in 1 week. 100 tablet 1  ? Multiple Vitamin (MULTIVITAMIN WITH MINERALS) TABS tablet Take 1 tablet by mouth daily. Senior Multivitamin    ? omeprazole (PRILOSEC) 20 MG capsule Take 1 capsule (20 mg total) by mouth daily. 30 capsule 0  ? polyethylene glycol (MIRALAX) 17 g packet Take 17 g by mouth 2 (two) times daily. 14 each 0  ? potassium chloride SA (KLOR-CON M) 20 MEQ tablet Take 1 tablet (20 mEq total) by mouth daily. 30 tablet 6  ? psyllium (METAMUCIL) 58.6 % packet Take 1 packet by mouth daily.    ? ramipril (ALTACE) 10 MG capsule TAKE 1 CAPSULE BY MOUTH DAILY GENERIC EQUIVALENT FOR ALTACE 90 capsule 3  ? White Petrolatum-Mineral Oil (REFRESH P.M. OP) Place 1 drop into both eyes daily as  needed (dry eyes).    ? dofetilide (TIKOSYN) 500 MCG capsule Take 1 capsule (500 mcg total) by mouth 2 (two) times daily. 180 capsule 2  ? ?No current facility-administered medications for this encounter.  ? ? ?Allergies  ?Allergen Reactions  ? Sulfonamide Derivatives Rash  ?  childhood  ? ? ?Social History  ? ?Socioeconomic History  ? Marital status: Married  ?  Spouse name: Remo Lipps  ? Number of children: 1  ? Years of education: 47  ? Highest education level: Associate degree: occupational, Hotel manager, or vocational program  ?Occupational History  ? Occupation: Retired  ?  Employer: RETIRED  ?  Comment: 1  ?Tobacco Use  ? Smoking status:  Former  ?  Packs/day: 2.00  ?  Years: 20.00  ?  Pack years: 40.00  ?  Types: Cigarettes  ?  Quit date: 08/07/1983  ?  Years since quitting: 38.3  ?  Passive exposure: Past  ? Smokeless tobacco: Never  ? Tobacco comments:  ?  Former smoker 11/17/21 quit over 40 years ago  ?Vaping Use  ? Vaping Use: Never used  ?Substance and Sexual Activity  ? Alcohol use: Yes  ?  Alcohol/week: 7.0 standard drinks  ?  Types: 7 Shots of liquor per week  ?  Comment: 1 at night before bed 09/28/21  ? Drug use: No  ? Sexual activity: Yes  ?  Partners: Female  ?Other Topics Concern  ? Not on file  ?Social History Narrative  ? Married and lives with wife 1974  ? One daughter, not local  ? Retired from [employer], sea based telecommunication/contracting  ? ?Social Determinants of Health  ? ?Financial Resource Strain: Low Risk   ? Difficulty of Paying Living Expenses: Not hard at all  ?Food Insecurity: No Food Insecurity  ? Worried About Charity fundraiser in the Last Year: Never true  ? Ran Out of Food in the Last Year: Never true  ?Transportation Needs: No Transportation Needs  ? Lack of Transportation (Medical): No  ? Lack of Transportation (Non-Medical): No  ?Physical Activity: Insufficiently Active  ? Days of Exercise per Week: 7 days  ? Minutes of Exercise per Session: 10 min  ?Stress: No Stress Concern Present   ? Feeling of Stress : Not at all  ?Social Connections: Moderately Integrated  ? Frequency of Communication with Friends and Family: Three times a week  ? Frequency of Social Gatherings with Friends and

## 2021-11-20 ENCOUNTER — Other Ambulatory Visit: Payer: Self-pay | Admitting: Internal Medicine

## 2021-11-22 ENCOUNTER — Encounter: Payer: Self-pay | Admitting: Orthopedic Surgery

## 2021-11-22 ENCOUNTER — Ambulatory Visit (INDEPENDENT_AMBULATORY_CARE_PROVIDER_SITE_OTHER): Payer: Medicare Other | Admitting: Surgical

## 2021-11-22 DIAGNOSIS — M25551 Pain in right hip: Secondary | ICD-10-CM | POA: Diagnosis not present

## 2021-11-22 MED ORDER — DIAZEPAM 10 MG PO TABS
ORAL_TABLET | ORAL | 0 refills | Status: DC
Start: 2021-11-22 — End: 2022-05-30

## 2021-11-22 NOTE — Progress Notes (Signed)
? ?Office Visit Note ?  ?Patient: James [name]           ?Date of Birth: Dec 14, 1945           ?MRN: [id number] ?Visit Date: 11/22/2021 ?Requested by: Tonia Ghent, MD ?[address] ?[place],  [place] [address] ?PCP: Tonia Ghent, MD ? ?Subjective: ?Chief Complaint  ?Patient presents with  ? Right Hip - Follow-up  ? ? ?HPI: James [name] is a 76 y.o. male who presents to the office for MRI review. Patient denies any changes in symptoms.  Continues to complain mainly of right buttocks pain.  He states that he has aching hip pain with a feeling of fatigue after walking about 200 yards.  Denies any numbness or tingling or any significant back pain.  No recent falls or injuries.  He had injection in his right hip joint that provided minimal relief of his symptoms.  He has occasional pain that travels down his leg from the buttocks but most of his symptoms are confined to the right buttocks.  Does not really have much in the way of groin pain. ? ?MRI results revealed: ?MR Pelvis w/o contrast ? ?Result Date: 11/06/2021 ?CLINICAL DATA:  Evaluate occult osteoarthritis of right hip. EXAM: MRI PELVIS WITHOUT CONTRAST TECHNIQUE: Multiplanar multisequence MR imaging of the pelvis was performed. No intravenous contrast was administered. COMPARISON:  Radiograph dated November 01, 2021 FINDINGS: Bones: No hip fracture, dislocation or avascular necrosis. No periosteal reaction or bone destruction. No aggressive osseous lesion. Normal sacrum and sacroiliac joints. No SI joint widening or erosive changes. Articular cartilage and labrum Articular cartilage: Generalized articular cartilage thinning without evidence of full-thickness defect. Labrum: Grossly intact, but evaluation is limited by lack of intraarticular fluid. Joint or bursal effusion Joint effusion:  No hip joint effusion.  No SI joint effusion. Bursae:  No bursa formation. Muscles and tendons Minimal edema about the origin of the iliacus muscle bilaterally.  Iliopsoas tendon is intact. The muscles of the flexor, extensor and adductor compartments are intact. Mild tendinopathy of the hamstring origin without evidence of tear. Other findings No pelvic free fluid. No fluid collection or hematoma. No inguinal lymphadenopathy. No inguinal hernia. IMPRESSION: 1.  No evidence of fracture or dislocation. 2. Mild articular cartilage thinning without evidence of significant osteoarthritis. No appreciable joint effusion. 3. Mild edema about the origin of the iliacus muscle as well as mild hamstring tendinopathy. No appreciable tendon tear. Electronically Signed   By: Keane Police D.O.   On: 11/06/2021 11:13   ? ?             ?ROS: All systems reviewed are negative as they relate to the chief complaint within the history of present illness.  Patient denies fevers or chills. ? ?Assessment & Plan: ?Visit Diagnoses:  ?1. Pain in right hip   ? ? ?Plan: James [name] is a 76 y.o. male who presents to the office for evaluation of right buttocks pain.  He has had minimal relief from right hip joint injection.  He is here today to review MRI of the pelvis that demonstrated mild thinning of the articular cartilage of the right hip joint with no other significant, compelling findings.  With his pain primarily localized to the buttocks, concerned that he may have some stenosis or lumbar spine pathology that is causing referred pain into the hip.  He does have a fatigued feeling in this region with a set distance of 200 yards that  subsides after he rests.  This may be a finding of spinal stenosis.  He has radiographs of the lumbar spine from about 6 months ago that demonstrate degenerative disc disease at multiple levels, worst at L4-L5 and L1-L2.  Plan for further evaluation with MRI of the lumbar spine.  Follow-up after MRI to review results. ? ?Follow-Up Instructions: No follow-ups on file.  ? ?Orders:  ?Orders Placed This Encounter  ?Procedures  ? MR Lumbar Spine w/o contrast  ? ?No  orders of the defined types were placed in this encounter. ? ? ? ? Procedures: ?No procedures performed ? ? ?Clinical Data: ?No additional findings. ? ?Objective: ?Vital Signs: There were no vitals taken for this visit. ? ?Physical Exam:  ?Constitutional: Patient appears well-developed ?HEENT:  ?Head: Normocephalic ?Eyes:EOM are normal ?Neck: Normal range of motion ?Cardiovascular: Normal rate ?Pulmonary/chest: Effort normal ?Neurologic: Patient is alert ?Skin: Skin is warm ?Psychiatric: Patient has normal mood and affect ? ?Ortho Exam: Ortho exam demonstrates 5/5 motor strength of bilateral hip flexion, quadricep, hamstring, dorsiflexion, plantarflexion.  No tenderness throughout the axial lumbar spine.  Minimal pain with internal rotation/external rotation of the hip joint.  Negative Stinchfield sign.  No tenderness over the greater trochanter.  Sensation intact through all dermatomes of the bilateral lower extremities. ? ?Specialty Comments:  ?No specialty comments available. ? ?Imaging: ?No results found. ? ? ?PMFS History: ?Patient Active Problem List  ? Diagnosis Date Noted  ? Afib ([place]) 11/07/2021  ? Rib injury 10/03/2021  ? Hyperglycemia 08/27/2021  ? Dysphagia 08/27/2021  ? Thoracic aortic aneurysm without rupture ([place]) 11/25/2019  ? Chronic heart failure with preserved ejection fraction (HFpEF) ([place]) 08/18/2018  ? Hyperlipidemia LDL goal <70 08/18/2018  ? Obstructive sleep apnea 05/23/2018  ? Coronary artery disease involving native coronary artery of native heart without angina pectoris 05/16/2018  ? Unstable angina ([facility])   ? CHF (congestive heart failure) ([place]) 05/09/2018  ? Benign prostatic hyperplasia with nocturia 05/07/2018  ? Dyspnea on exertion 02/19/2018  ? Persistent atrial fibrillation ([place]) 02/12/2018  ? Cardiac murmur 02/12/2018  ? Knee pain 08/07/2017  ? Weak urinary stream 05/01/2016  ? Healthcare maintenance 05/01/2016  ? Hematuria 03/06/2016  ? Hx of adenomatous colonic polyps 12/05/2015   ? Advance care planning 07/21/2014  ? Medicare annual wellness visit, subsequent 07/01/2012  ? External hemorrhoids 06/27/2011  ? Hypothyroidism 02/24/2008  ? HYPERCHOLESTEROLEMIA 02/21/2007  ? Essential hypertension 02/21/2007  ? DEGENERATIVE JOINT DISEASE, CERVICAL SPINE 02/21/2007  ? ?Past Medical History:  ?Diagnosis Date  ? (HFpEF) heart failure with preserved ejection fraction ([place])   ? a. 05/2018 Echo: EF 55-60%, gr2 DD.  ? Acute lower GI bleeding after colonoscopy and polypectomy, resolved 12/12/2015  ? Ascending aortic aneurysm ([place])   ? a. 02/2018 Echo: mildly dil Ao root/asc ao/arch; b. 05/2018 CTA Chest: 4.7cm fusiform aneurysm of Asc Ao.  ? CAD (coronary artery disease)   ? a. 05/2018 Cath/PCI: LM nl,LAD 30p, 90/99d/apical, LCX 57m OM1/2/3 min irregs, RCA 85p (3.5x18 [place].  ? Cardiac murmur   ? a. 02/2018 Echo: EF 60-65%, no rwma, mild AI, mildly dil Ao root/Asc Ao/Arch, Mild MR, mildly dil LA. Nl RV fxn.  ? Cataract   ? bilateral surgery to remove  ? CHF (congestive heart failure) ([facility]   ? Colon polyp   ? Constipation   ? Essential hypertension   ? Hemorrhoids   ? Hepatitis   ? self resolved, likely food exposure, 1974.   ? History of cardiovascular  stress test   ? a. 02/2018 Myoview: EF 55-65%, small/mild apical defect w/ nl wall motion ->attenuation artifact. No ischemia. Low risk study.  ? Hx of adenomatous colonic polyps 12/05/2015  ? Hyperlipidemia   ? Hypothyroidism   ? Mitral regurgitation   ? a. 110/2019 Echo: EF 55-60%. Gr2 DD. Triv AI. Ao root 79m. Mild MR. Mod dil LA, mildly dil RA.  ? Osteoarthritis   ? Persistent atrial fibrillation ([facility]   ? a. Dx 02/2018. CHA2DS2VASc = 2-->Eliquis; b. 03/2018 DCCV (200J); c. 03/2018 recurrent Afib-->flecainide started 04/2018;  d. 05/06/2018 s/p successful DCCV - 200J x 2-->on amio.  ? Skin cancer   ? basal cell, R ear, MOHS  ?  ?Family History  ?Problem Relation Age of Onset  ? Stroke Mother   ? Lung cancer Maternal Grandfather   ?     smoker  ?  Stroke Maternal Grandfather   ? Heart disease Paternal Grandfather   ?     MI, old age  ? Colon cancer Neg Hx   ? Colon polyps Neg Hx   ? Esophageal cancer Neg Hx   ? Rectal cancer Neg Hx   ? Stomach cancer Neg Hx

## 2021-11-23 ENCOUNTER — Encounter: Payer: Self-pay | Admitting: Internal Medicine

## 2021-11-23 ENCOUNTER — Ambulatory Visit: Payer: Medicare Other | Admitting: Internal Medicine

## 2021-11-23 VITALS — BP 140/80 | HR 56 | Ht 72.0 in | Wt 267.0 lb

## 2021-11-23 DIAGNOSIS — I5032 Chronic diastolic (congestive) heart failure: Secondary | ICD-10-CM | POA: Diagnosis not present

## 2021-11-23 DIAGNOSIS — I2510 Atherosclerotic heart disease of native coronary artery without angina pectoris: Secondary | ICD-10-CM | POA: Diagnosis not present

## 2021-11-23 DIAGNOSIS — E785 Hyperlipidemia, unspecified: Secondary | ICD-10-CM

## 2021-11-23 DIAGNOSIS — I4819 Other persistent atrial fibrillation: Secondary | ICD-10-CM

## 2021-11-23 DIAGNOSIS — I10 Essential (primary) hypertension: Secondary | ICD-10-CM | POA: Diagnosis not present

## 2021-11-23 NOTE — Progress Notes (Signed)
? ?Follow-up Outpatient Visit ?Date: 11/23/2021 ? ?Primary Care Provider: ?Tonia Ghent, MD ?[address] ?[place] [address] ? ?Chief Complaint: Follow-up CAD and atrial fibrillation ? ?HPI:  James [name] is a 76 y.o. male with history of coronary artery disease status post PCI to the RCA (2019), persistent atrial fibrillation, thoracic aortic aneurysm, HFpEF, hypertension, hyperlipidemia, hypothyroidism, and obesity, who presents for follow-up of coronary artery disease, HFpEF, and atrial fibrillation.  I last saw him in 05/2021 at which time he was doing well.  I referred him to electrophysiology for discussion of long-term rhythm control strategies, given our desire to switch from amiodarone to another agent.  He was transition to dofetilide earlier this month. ? ?Mr. Witts reports that he has been feeling well.  He had a single episode of transient dizziness on the day that he returned home following dofetilide loading.  He has not had any recurrence.  He also denies chest pain, palpitations, and edema.  Stable exertional dyspnea with strenuous activities is unchanged from baseline.  He tries to walk several times a day, though chronic right hip pain limits the duration that he is able to walk.  Home blood pressures are typically in the upper 166A systolic. ? ?-------------------------------------------------------------------------------------------------- ? ?Past Medical History:  ?Diagnosis Date  ? (HFpEF) heart failure with preserved ejection fraction ([place])   ? a. 05/2018 Echo: EF 55-60%, gr2 DD.  ? Acute lower GI bleeding after colonoscopy and polypectomy, resolved 12/12/2015  ? Ascending aortic aneurysm ([place])   ? a. 02/2018 Echo: mildly dil Ao root/asc ao/arch; b. 05/2018 CTA Chest: 4.7cm fusiform aneurysm of Asc Ao.  ? CAD (coronary artery disease)   ? a. 05/2018 Cath/PCI: LM nl,LAD 30p, 90/99d/apical, LCX 62m OM1/2/3 min irregs, RCA 85p (3.5x18 [place].  ? Cardiac murmur   ? a. 02/2018  Echo: EF 60-65%, no rwma, mild AI, mildly dil Ao root/Asc Ao/Arch, Mild MR, mildly dil LA. Nl RV fxn.  ? Cataract   ? bilateral surgery to remove  ? CHF (congestive heart failure) ([facility]   ? Colon polyp   ? Constipation   ? Essential hypertension   ? Hemorrhoids   ? Hepatitis   ? self resolved, likely food exposure, 1974.   ? History of cardiovascular stress test   ? a. 02/2018 Myoview: EF 55-65%, small/mild apical defect w/ nl wall motion ->attenuation artifact. No ischemia. Low risk study.  ? Hx of adenomatous colonic polyps 12/05/2015  ? Hyperlipidemia   ? Hypothyroidism   ? Mitral regurgitation   ? a. 110/2019 Echo: EF 55-60%. Gr2 DD. Triv AI. Ao root 373m Mild MR. Mod dil LA, mildly dil RA.  ? Osteoarthritis   ? Persistent atrial fibrillation ([facility]  ? a. Dx 02/2018. CHA2DS2VASc = 2-->Eliquis; b. 03/2018 DCCV (200J); c. 03/2018 recurrent Afib-->flecainide started 04/2018;  d. 05/06/2018 s/p successful DCCV - 200J x 2-->on amio.  ? Skin cancer   ? basal cell, R ear, MOHS  ? ?Past Surgical History:  ?Procedure Laterality Date  ? BROW LIFT Bilateral 10/23/2016  ? Procedure: BLEPHAROPLASTY upper eyelid with excess skin;  Surgeon: AmKarle StarchMD;  [place] [facility] Service: Ophthalmology;  Laterality: Bilateral;  MAC  ? CARDIOVERSION N/A 03/18/2018  ? Procedure: CARDIOVERSION;  Surgeon: EnNelva BushMD;  Location: [facility];  Service: Cardiovascular;  Laterality: N/A;  ? CARDIOVERSION N/A 05/06/2018  ? Procedure: CARDIOVERSION;  Surgeon: EnNelva BushMD;  Location: [facility];  Service: Cardiovascular;  Laterality:  N/A;  ? CATARACT EXTRACTION  1986  ? OD  ? CATARACT EXTRACTION Bilateral 1995  ? x 2 for right and left   ? COLONOSCOPY    ? CORONARY STENT INTERVENTION N/A 05/13/2018  ? Procedure: CORONARY STENT INTERVENTION;  Surgeon: Nelva Bush, MD;  Location: [place];  Service: Cardiovascular;  Laterality: N/A;  ? ECTROPION REPAIR Bilateral 10/23/2016  ? Procedure: REPAIR OF ECTROPION  sutures, extensive;  Surgeon: Karle Starch, MD;  Location: [facility];  Service: Ophthalmology;  Laterality: Bilateral;  ? LIPOMA EXCISION  06/1997  ? left neck ([place])  ? MOHS SURGERY Right 2013  ? behind right ear  ? RIGHT/LEFT HEART CATH AND CORONARY ANGIOGRAPHY N/A 05/12/2018  ? Procedure: RIGHT/LEFT HEART CATH AND CORONARY ANGIOGRAPHY;  Surgeon: Wellington Hampshire, MD;  Location: [facility];  Service: Cardiovascular;  Laterality: N/A;  ? TONSILLECTOMY    ? VASECTOMY  1982  ? ? ?Current Meds  ?Medication Sig  ? atorvastatin (LIPITOR) 80 MG tablet TAKE 1 TABLET BY MOUTH DAILY AT 6 PM  ? diazepam (VALIUM) 10 MG tablet Take 1 tablet by mouth 30 to 60 minutes before MRI scan.  Do not operate motor vehicle while taking this medication.  ? diclofenac Sodium (VOLTAREN) 1 % GEL Apply 2 g topically 4 (four) times daily as needed.  ? dofetilide (TIKOSYN) 500 MCG capsule Take 1 capsule (500 mcg total) by mouth 2 (two) times daily.  ? doxazosin (CARDURA) 4 MG tablet TAKE 1 TABLET BY MOUTH DAILY AFTER BREAKFAST  ? ELIQUIS 5 MG TABS tablet TAKE 1 TABLET BY MOUTH TWICE DAILY  ? ezetimibe (ZETIA) 10 MG tablet TAKE 1 TABLET BY MOUTH DAILY.  ? finasteride (PROSCAR) 5 MG tablet TAKE 1 TABLET BY MOUTH EVERY DAY  ? furosemide (LASIX) 20 MG tablet Take 1 tablet (20 mg total) by mouth every other day  ? ibuprofen (ADVIL) 200 MG tablet Take 400 mg by mouth every 6 (six) hours as needed for moderate pain.  ? levothyroxine (SYNTHROID) 150 MCG tablet Take 1 tablet (150 mcg total) by mouth daily before breakfast. Except take 1/2 tablet on Sundays.  Total of 6.5 tablets in 1 week.  ? Multiple Vitamin (MULTIVITAMIN WITH MINERALS) TABS tablet Take 1 tablet by mouth daily. Senior Multivitamin  ? omeprazole (PRILOSEC) 20 MG capsule Take 1 capsule (20 mg total) by mouth daily.  ? polyethylene glycol (MIRALAX) 17 g packet Take 17 g by mouth 2 (two) times daily.  ? potassium chloride SA (KLOR-CON M) 20 MEQ tablet Take 1 tablet  (20 mEq total) by mouth daily.  ? psyllium (METAMUCIL) 58.6 % packet Take 1 packet by mouth daily.  ? ramipril (ALTACE) 10 MG capsule TAKE ONE CAPSULE BY MOUTH DAILY GENERIC EQUIVALENT FOR ALTACE  ? White Petrolatum-Mineral Oil (REFRESH P.M. OP) Place 1 drop into both eyes daily as needed (dry eyes).  ? ? ?Allergies: Sulfonamide derivatives ? ?Social History  ? ?Tobacco Use  ? Smoking status: Former  ?  Packs/day: 2.00  ?  Years: 20.00  ?  Pack years: 40.00  ?  Types: Cigarettes  ?  Quit date: 08/07/1983  ?  Years since quitting: 38.3  ?  Passive exposure: Past  ? Smokeless tobacco: Never  ? Tobacco comments:  ?  Former smoker 11/17/21 quit over 40 years ago  ?Vaping Use  ? Vaping Use: Never used  ?Substance Use Topics  ? Alcohol use: Yes  ?  Alcohol/week: 7.0 standard drinks  ?  Types: 7 Shots of liquor per week  ?  Comment: 1 at night before bed 09/28/21  ? Drug use: No  ? ? ?Family History  ?Problem Relation Age of Onset  ? Stroke Mother   ? Lung cancer Maternal Grandfather   ?     smoker  ? Stroke Maternal Grandfather   ? Heart disease Paternal Grandfather   ?     MI, old age  ? Colon cancer Neg Hx   ? Colon polyps Neg Hx   ? Esophageal cancer Neg Hx   ? Rectal cancer Neg Hx   ? Stomach cancer Neg Hx   ? Bladder Cancer Neg Hx   ? Prostate cancer Neg Hx   ? ? ?Review of Systems: ?A 12-system review of systems was performed and was negative except as noted in the HPI. ? ?-------------------------------------------------------------------------------------------------- ? ?Physical Exam: ?BP 140/80 (BP Location: Left Arm, Patient Position: Sitting, Cuff Size: Large)   Pulse (!) 56   Ht 6' (1.829 m)   Wt 267 lb (121.1 kg)   SpO2 99%   BMI 36.21 kg/m?  ? ?General:  NAD. ?Neck: No JVD or HJR. ?Lungs: Clear to auscultation bilaterally without wheezes or crackles. ?Heart: Bradycardic but regular without murmurs, rubs, or gallops. ?Abdomen: Soft, nontender, nondistended. ?Extremities: No lower extremity edema. ? ?EKG:  Sinus bradycardia with left axis deviation and poor R wave progression (likely due to lead placement). ? ?Lab Results  ?Component Value Date  ? WBC 9.0 07/08/2021  ? HGB 14.5 07/08/2021  ? HCT 43.2 07/08/2021

## 2021-11-23 NOTE — Patient Instructions (Signed)
Medication Instructions:  ? ?Your physician recommends that you continue on your current medications as directed. Please refer to the Current Medication list given to you today. ? ?*If you need a refill on your cardiac medications before your next appointment, please call your pharmacy* ? ? ?Lab Work: ? ?None ordered ? ?Testing/Procedures: ? ?None ordered ? ? ?Follow-Up: ?At [facility], you and your health needs are our priority.  As part of our continuing mission to provide you with exceptional heart care, we have created designated Provider Care Teams.  These Care Teams include your primary Cardiologist (physician) and Advanced Practice Providers (APPs -  Physician Assistants and Nurse Practitioners) who all work together to provide you with the care you need, when you need it. ? ?We recommend signing up for the patient portal called "MyChart".  Sign up information is provided on this After Visit Summary.  MyChart is used to connect with patients for Virtual Visits (Telemedicine).  Patients are able to view lab/test results, encounter notes, upcoming appointments, etc.  Non-urgent messages can be sent to your provider as well.   ?To learn more about what you can do with MyChart, go to [email or web address].   ? ?Your next appointment:   ?6 month(s) ? ?The format for your next appointment:   ?In Person ? ?Provider:   ?You may see Nelva Bush, MD or one of the following Advanced Practice Providers on your designated Care Team:   ?Murray Hodgkins, NP ?Christell Faith, PA-C ?Cadence Kathlen Mody, PA-C  ? ? ?Other Instructions ? ?Watch your blood pressure at home and call our office if BP is consistently greater than 140/90. ? ?Important Information About Sugar ? ? ? ? ? ? ?

## 2021-11-27 ENCOUNTER — Telehealth: Payer: Self-pay | Admitting: Family Medicine

## 2021-11-27 ENCOUNTER — Encounter: Payer: Self-pay | Admitting: Family Medicine

## 2021-11-27 MED ORDER — LEVOTHYROXINE SODIUM 150 MCG PO TABS
150.0000 ug | ORAL_TABLET | Freq: Every day | ORAL | 0 refills | Status: DC
Start: 2021-11-27 — End: 2021-11-27

## 2021-11-27 MED ORDER — LEVOTHYROXINE SODIUM 150 MCG PO TABS: 150.0000 ug | ORAL_TABLET | Freq: Every day | ORAL | 3 refills | Status: DC

## 2021-11-27 NOTE — Telephone Encounter (Signed)
Encourage patient to contact the pharmacy for refills or they can request refills through [facility] ? ?Did the patient contact the pharmacy:  Yes awhile back ? ?LAST APPOINTMENT DATE:  08/25/2021 ? ?MEDICATION:  levothyroxine (SYNTHROID) 150 MCG tablet ? ?Is the patient out of medication? Almost  ? ?If not, how much is left? A couple of days worth ? ?Is this a 90 day supply: Yes ? ?PHARMACY: [id number] - [place], [place] ? ?Let patient know to contact pharmacy at the end of the day to make sure medication is ready. ? ?Please notify patient to allow 48-72 hours to process ?  ?

## 2021-11-27 NOTE — Telephone Encounter (Signed)
Per note Janett Billow CMA has already spoken with pt and refill done. ?

## 2021-11-27 NOTE — Telephone Encounter (Signed)
Rx has been sent and patient is aware. ?

## 2021-11-27 NOTE — Telephone Encounter (Signed)
[facility] - Client ?Nonclinical Telephone Record  ?[facility]? ?Client [facility] - Client ?Client Site [place] ?Provider Renford Dills - MD ?Contact Type Call ?Who Is Calling Patient / Member / Family / Caregiver ?Caller Name [name] ?Caller Phone Number [phone] ?Patient Name James [name] ?Patient DOB 25-Jan-1946 ?Call Type Message Only Information Provided ?Reason for Call Medication Question / Request ?Initial Comment Caller states they are running out of rx. ?Disp. Time Disposition Final User ?11/27/2021 7:36:22 AM General Information Provided Yes [name] ?Call Closed By: [name] ?Transaction Date/Time: 11/27/2021 7:34:42 AM (ET ?

## 2021-11-28 ENCOUNTER — Encounter: Payer: Self-pay | Admitting: Internal Medicine

## 2021-12-08 ENCOUNTER — Ambulatory Visit
Admission: RE | Admit: 2021-12-08 | Discharge: 2021-12-08 | Disposition: A | Discharge status: Home / Self Care | Payer: Medicare Other | Source: Ambulatory Visit | Attending: Surgical | Admitting: Surgical

## 2021-12-08 DIAGNOSIS — M25551 Pain in right hip: Secondary | ICD-10-CM

## 2021-12-08 IMAGING — MR MR LUMBAR SPINE W/O CM
4 of 5 series · 18 of 48 positions shown · non-contrast
Comparison: Plain films lumbar spine [DATE].

CLINICAL DATA: Low back pain radiating to the right hip for 1 year.
No known injury.

EXAM:
MRI LUMBAR SPINE WITHOUT CONTRAST
TECHNIQUE: Multiplanar, multisequence MR imaging of the lumbar spine was
performed. No intravenous contrast was administered.

[Series 6: T2 · sagittal · 4.0mm · 0.73mm/px · 6 of 16 slices shown (1 of 2)]
[im 1/16]
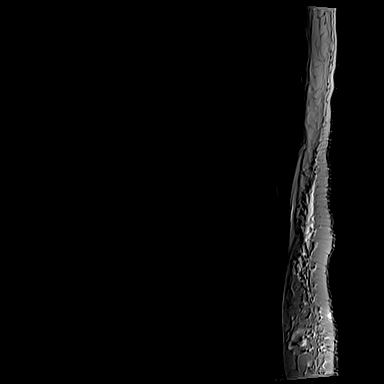
[im 4/16]
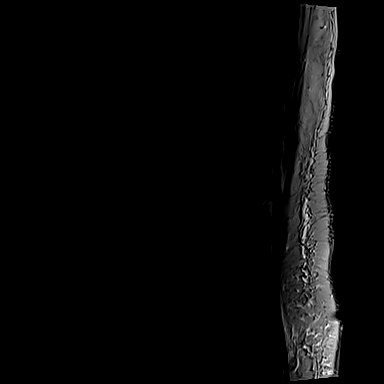
[im 7/16]
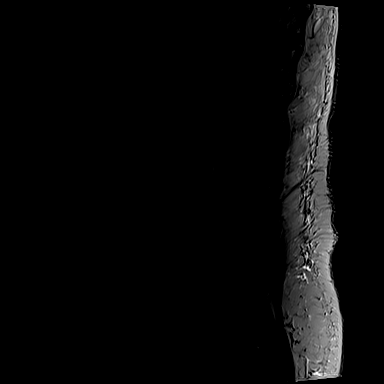
[im 10/16]
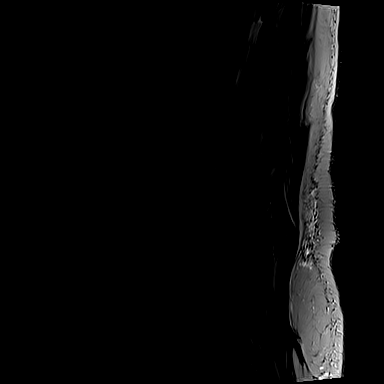
[im 13/16]
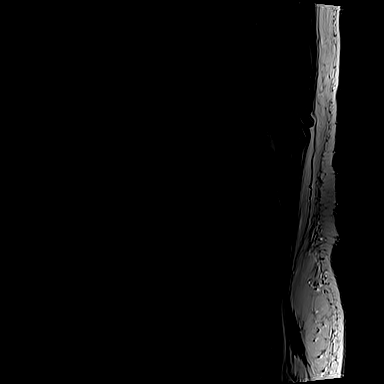
[im 16/16]
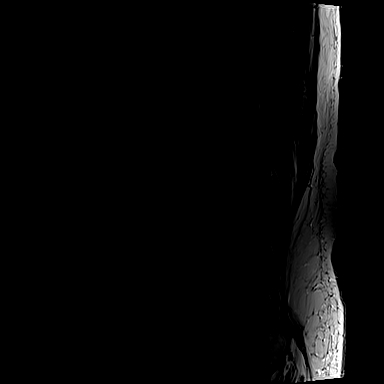

[Series 7: T1 · sagittal · 4.0mm · 0.73mm/px · 3 of 16 slices shown (1 of 2)]
[im 4/16]
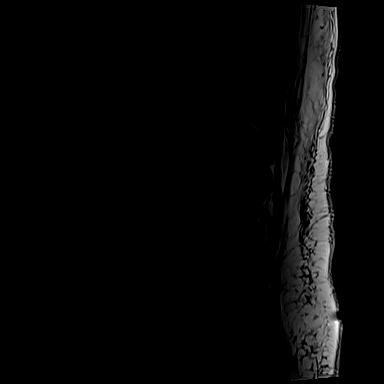
[im 10/16]
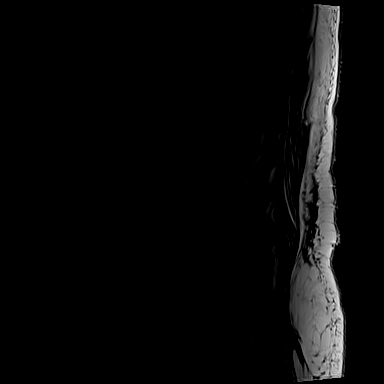
[im 16/16]
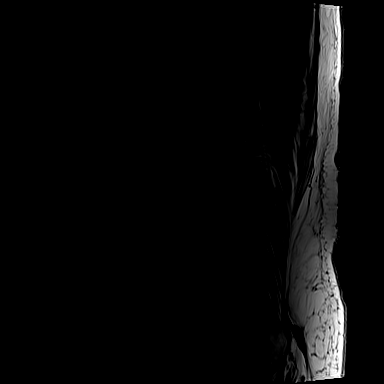

[Series 13: T2 · axial · 4.0mm · 0.28mm/px · z∈[-111,+69]mm · 6 of 42 slices shown (2 of 2)]
[im 1/42]
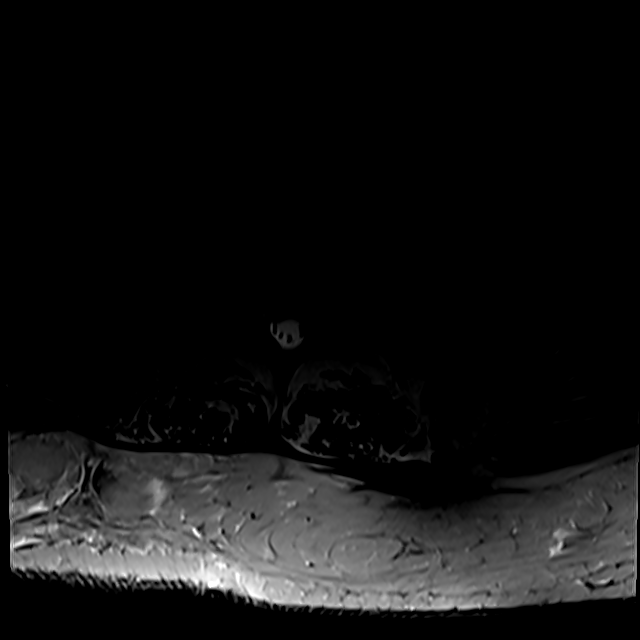
[im 6/42]
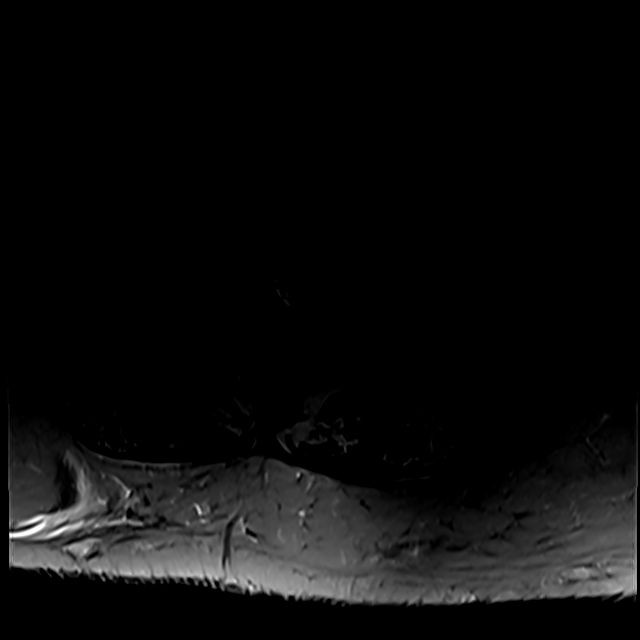
[im 12/42]
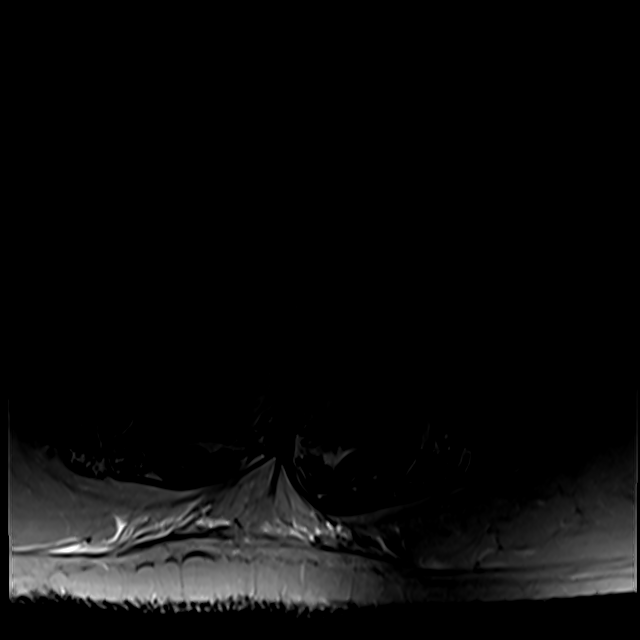
[im 18/42]
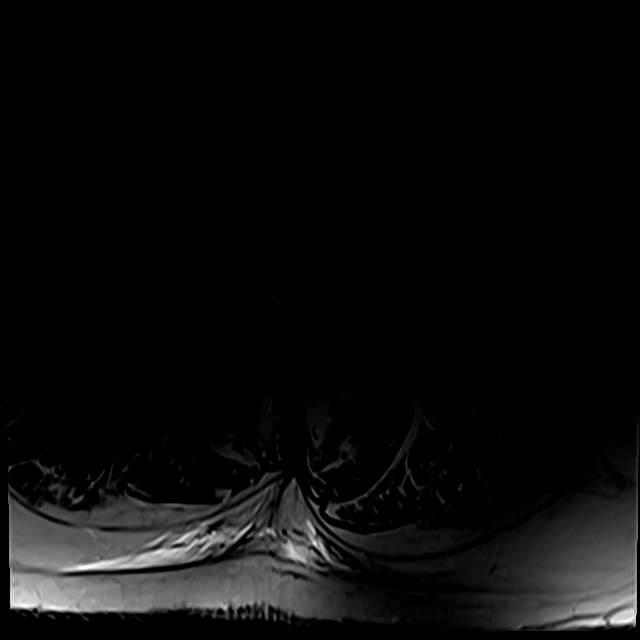
[im 21/42]
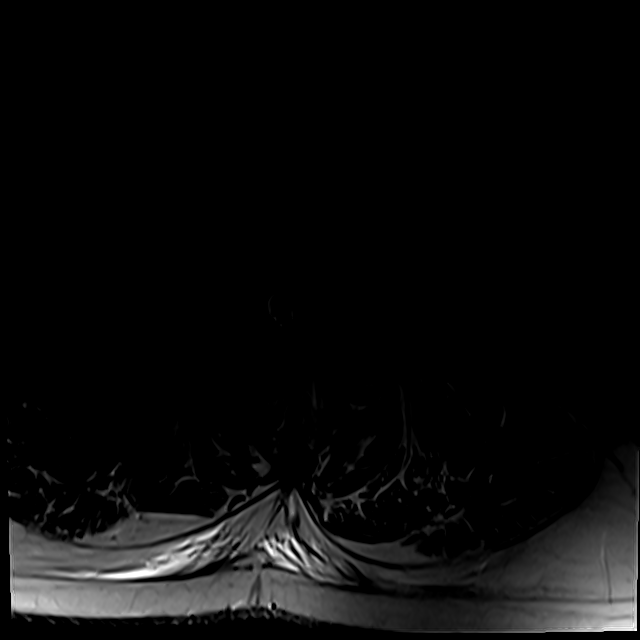
[im 36/42]
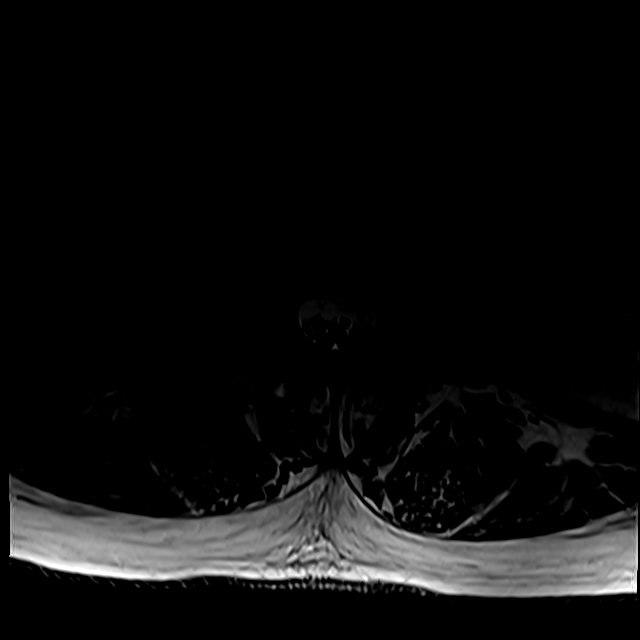

[Series 100: T1 · axial · 4.0mm · 0.28mm/px · z∈[-86,+69]mm · 3 of 42 slices shown (2 of 2)]
[im 6/42]
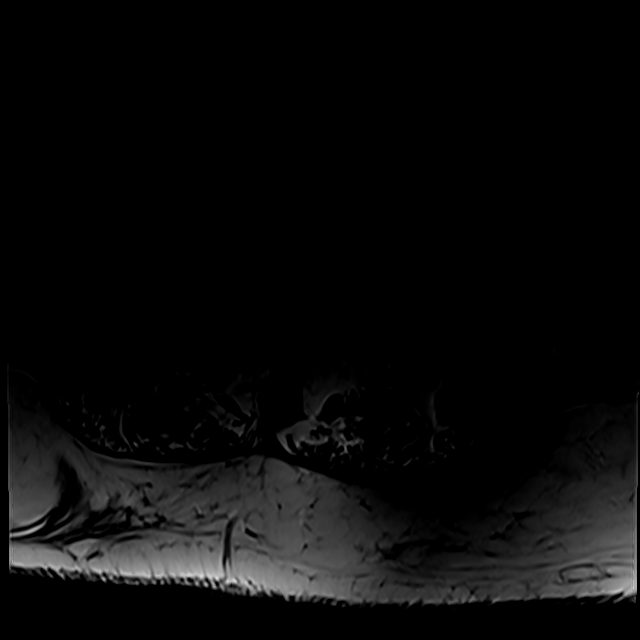
[im 21/42]
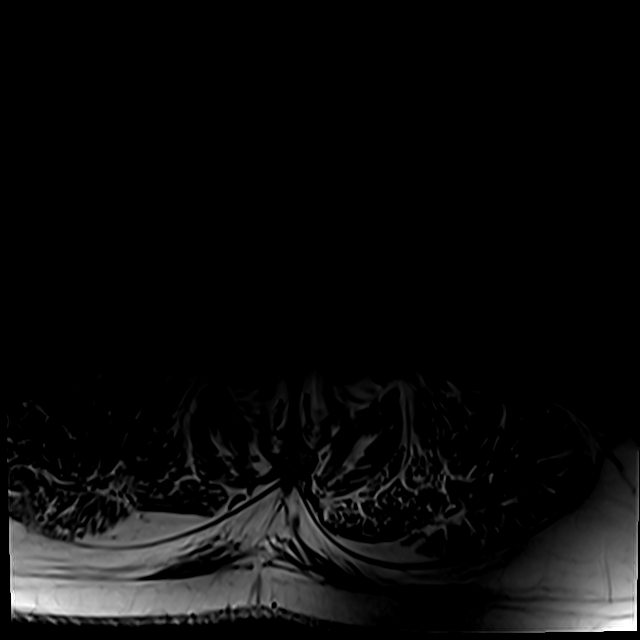
[im 36/42]
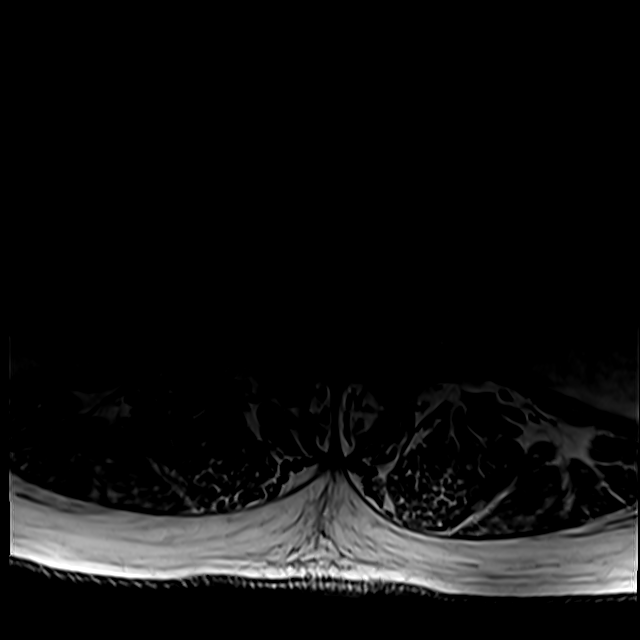

[18 of 48 positions shown; findings below may reference images not displayed]

FINDINGS: Segmentation:  Standard.

Alignment:  Trace retrolisthesis L4 on L5.

Vertebrae: No fracture, evidence of discitis, or bone lesion.
Scattered small Schmorl's nodes noted

Conus medullaris and cauda equina: Conus extends to the L1-2 level.
Conus and cauda equina appear normal.

Paraspinal and other soft tissues: Negative.

Disc levels:

T12-L1: Tiny central protrusion without stenosis.

L1-2: Shallow disc bulge without stenosis.

L2-3: Minimal disc bulge without stenosis.

L3-4: Mild-to-moderate facet degenerative change and a shallow disc
bulge. The central canal and foramina are open.

L4-5: A shallow disc bulge and ligamentum flavum thickening cause
mild central canal narrowing. There is also mild bilateral foraminal
narrowing.

L5-S1: Moderate facet degenerative change and a shallow disc bulge.
The central canal is open. Mild bilateral foraminal narrowing is
present.
IMPRESSION: Mild central canal and bilateral foraminal narrowing at L4-5.

Mild bilateral foraminal narrowing L5-S1.

## 2021-12-18 ENCOUNTER — Other Ambulatory Visit: Payer: Self-pay | Admitting: Family Medicine

## 2021-12-18 ENCOUNTER — Ambulatory Visit: Payer: Medicare Other | Admitting: Orthopedic Surgery

## 2021-12-18 DIAGNOSIS — M25551 Pain in right hip: Secondary | ICD-10-CM | POA: Diagnosis not present

## 2021-12-19 ENCOUNTER — Encounter: Payer: Self-pay | Admitting: Orthopedic Surgery

## 2021-12-19 NOTE — Progress Notes (Signed)
? ?Office Visit Note ?  ?Patient: James [name]           ?Date of Birth: [birth year]           ?MRN: [id number] ?Visit Date: 12/18/2021 ?Requested by: Tonia Ghent, MD ?[address] ?[place],  [place] [address] ?PCP: Tonia Ghent, MD ? ?Subjective: ?Chief Complaint  ?Patient presents with  ? Other  ?  Scan review  ? ? ?HPI: James [name] is a 76 year old patient here to review MRI of his lumbar spine.  Overall he has been doing some increased walking and he is feeling better.  Patient thinks that he may be a little bit out of shape.  Describing more fatigue type pain in the right hip when he was walking.  Left knee remains stiff and arthritic but he is working through that as well.  MRI scan of the lumbar spine shows mild bilateral foraminal narrowing at L5-S1 and mild central canal and bilateral foraminal narrowing at L4-5.  Prior pelvic MRI scan also unremarkable in terms of definitive soft tissue or bony hip pathology. ?             ?ROS: All systems reviewed are negative as they relate to the chief complaint within the history of present illness.  Patient denies  fevers or chills. ? ? ?Assessment & Plan: ?Visit Diagnoses:  ?1. Pain in right hip   ? ? ?Plan: Impression is improvement in symptoms following moderate exercise program.  Nothing really injectable surgical in the back or hip or pelvis.  Overall this is good news for James [name] in terms of lack of definitive structural pathology in these regions.  I think his exercise program is indicated and helping.  Could consider injections if symptoms worsen depending on where they localize to either the hip or back.  Follow-up with [place] as needed. ? ?Follow-Up Instructions: Return if symptoms worsen or fail to improve.  ? ?Orders:  ?No orders of the defined types were placed in this encounter. ? ?No orders of the defined types were placed in this encounter. ? ? ? ? Procedures: ?No procedures performed ? ? ?Clinical Data: ?No additional  findings. ? ?Objective: ?Vital Signs: There were no vitals taken for this visit. ? ?Physical Exam:  ? ?Constitutional: Patient appears well-developed ?HEENT:  ?Head: Normocephalic ?Eyes:EOM are normal ?Neck: Normal range of motion ?Cardiovascular: Normal rate ?Pulmonary/chest: Effort normal ?Neurologic: Patient is alert ?Skin: Skin is warm ?Psychiatric: Patient has normal mood and affect ? ? ?Ortho Exam: Ortho exam demonstrates no groin pain with internal/external rotation of either leg.  No nerve root tension signs.  Patient has slight flexion contracture on the left.  No knee effusion bilaterally.  Ankle dorsiflexion plantarflexion quad hamstring strength intact.  No masses lymphadenopathy or skin changes noted in that knee region hip region or back region.  Some pain with forward and lateral bending. ? ?Specialty Comments:  ?No specialty comments available. ? ?Imaging: ?No results found. ? ? ?PMFS History: ?Patient Active Problem List  ? Diagnosis Date Noted  ? Afib ([place]) 11/07/2021  ? Rib injury 10/03/2021  ? Hyperglycemia 08/27/2021  ? Dysphagia 08/27/2021  ? Thoracic aortic aneurysm without rupture ([place]) 11/25/2019  ? Chronic heart failure with preserved ejection fraction (HFpEF) ([place]) 08/18/2018  ? Hyperlipidemia LDL goal <70 08/18/2018  ? Obstructive sleep apnea 05/23/2018  ? Coronary artery disease involving native coronary artery of native heart without angina pectoris 05/16/2018  ? Unstable angina (HCC)   ? CHF (  congestive heart failure) ([place]) 05/09/2018  ? Benign prostatic hyperplasia with nocturia 05/07/2018  ? Dyspnea on exertion 02/19/2018  ? Persistent atrial fibrillation ([place]) 02/12/2018  ? Cardiac murmur 02/12/2018  ? Knee pain 08/07/2017  ? Weak urinary stream 05/01/2016  ? Healthcare maintenance 05/01/2016  ? Hematuria 03/06/2016  ? Hx of adenomatous colonic polyps 12/05/2015  ? Advance care planning 07/21/2014  ? Medicare annual wellness visit, subsequent 07/01/2012  ? External hemorrhoids  06/27/2011  ? Hypothyroidism 02/24/2008  ? HYPERCHOLESTEROLEMIA 02/21/2007  ? Essential hypertension 02/21/2007  ? DEGENERATIVE JOINT DISEASE, CERVICAL SPINE 02/21/2007  ? ?Past Medical History:  ?Diagnosis Date  ? (HFpEF) heart failure with preserved ejection fraction ([place])   ? a. 05/2018 Echo: EF 55-60%, gr2 DD.  ? Acute lower GI bleeding after colonoscopy and polypectomy, resolved 12/12/2015  ? Ascending aortic aneurysm ([place])   ? a. 02/2018 Echo: mildly dil Ao root/asc ao/arch; b. 05/2018 CTA Chest: 4.7cm fusiform aneurysm of Asc Ao.  ? CAD (coronary artery disease)   ? a. 05/2018 Cath/PCI: LM nl,LAD 30p, 90/99d/apical, LCX 6m OM1/2/3 min irregs, RCA 85p (3.5x18 [place].  ? Cardiac murmur   ? a. 02/2018 Echo: EF 60-65%, no rwma, mild AI, mildly dil Ao root/Asc Ao/Arch, Mild MR, mildly dil LA. Nl RV fxn.  ? Cataract   ? bilateral surgery to remove  ? CHF (congestive heart failure) ([facility]   ? Colon polyp   ? Constipation   ? Essential hypertension   ? Hemorrhoids   ? Hepatitis   ? self resolved, likely food exposure, 1974.   ? History of cardiovascular stress test   ? a. 02/2018 Myoview: EF 55-65%, small/mild apical defect w/ nl wall motion ->attenuation artifact. No ischemia. Low risk study.  ? Hx of adenomatous colonic polyps 12/05/2015  ? Hyperlipidemia   ? Hypothyroidism   ? Mitral regurgitation   ? a. 110/2019 Echo: EF 55-60%. Gr2 DD. Triv AI. Ao root 335m Mild MR. Mod dil LA, mildly dil RA.  ? Osteoarthritis   ? Persistent atrial fibrillation ([facility]  ? a. Dx 02/2018. CHA2DS2VASc = 2-->Eliquis; b. 03/2018 DCCV (200J); c. 03/2018 recurrent Afib-->flecainide started 04/2018;  d. 05/06/2018 s/p successful DCCV - 200J x 2-->on amio.  ? Skin cancer   ? basal cell, R ear, MOHS  ?  ?Family History  ?Problem Relation Age of Onset  ? Stroke Mother   ? Lung cancer Maternal Grandfather   ?     smoker  ? Stroke Maternal Grandfather   ? Heart disease Paternal Grandfather   ?     MI, old age  ? Colon cancer Neg Hx   ? Colon  polyps Neg Hx   ? Esophageal cancer Neg Hx   ? Rectal cancer Neg Hx   ? Stomach cancer Neg Hx   ? Bladder Cancer Neg Hx   ? Prostate cancer Neg Hx   ?  ?Past Surgical History:  ?Procedure Laterality Date  ? BROW LIFT Bilateral 10/23/2016  ? Procedure: BLEPHAROPLASTY upper eyelid with excess skin;  Surgeon: AmKarle StarchMD;  [place] Service: Ophthalmology;  Laterality: Bilateral;  MAC  ? CARDIOVERSION N/A 03/18/2018  ? Procedure: CARDIOVERSION;  Surgeon: EnNelva BushMD;  Location: [facility];  Service: Cardiovascular;  Laterality: N/A;  ? CARDIOVERSION N/A 05/06/2018  ? Procedure: CARDIOVERSION;  Surgeon: EnNelva BushMD;  Location: [facility];  Service: Cardiovascular;  Laterality: N/A;  ? CATARACT EXTRACTION  1986  ? OD  ?  CATARACT EXTRACTION Bilateral 1995  ? x 2 for right and left   ? COLONOSCOPY    ? CORONARY STENT INTERVENTION N/A 05/13/2018  ? Procedure: CORONARY STENT INTERVENTION;  Surgeon: Nelva Bush, MD;  Location: [place] [facility];  Service: Cardiovascular;  Laterality: N/A;  ? ECTROPION REPAIR Bilateral 10/23/2016  ? Procedure: REPAIR OF ECTROPION sutures, extensive;  Surgeon: Karle Starch, MD;  Location: [facility];  Service: Ophthalmology;  Laterality: Bilateral;  ? LIPOMA EXCISION  06/1997  ? left neck ([place])  ? MOHS SURGERY Right 2013  ? behind right ear  ? RIGHT/LEFT HEART CATH AND CORONARY ANGIOGRAPHY N/A 05/12/2018  ? Procedure: RIGHT/LEFT HEART CATH AND CORONARY ANGIOGRAPHY;  Surgeon: Wellington Hampshire, MD;  Location: [place] [facility];  Service: Cardiovascular;  Laterality: N/A;  ? TONSILLECTOMY    ? VASECTOMY  1982  ? ?Social History  ? ?Occupational History  ? Occupation: Retired  ?  Employer: RETIRED  ?  Comment: 1  ?Tobacco Use  ? Smoking status: Former  ?  Packs/day: 2.00  ?  Years: 20.00  ?  Pack years: 40.00  ?  Types: Cigarettes  ?  Quit date: 08/07/1983  ?  Years since quitting: 38.3  ?  Passive exposure: Past  ? Smokeless tobacco: Never   ? Tobacco comments:  ?  Former smoker 11/17/21 quit over 40 years ago  ?Vaping Use  ? Vaping Use: Never used  ?Substance and Sexual Activity  ? Alcohol use: Yes  ?  Alcohol/week: 7.0 standard drinks  ?  Types: 7 Shots of liquor per

## 2021-12-26 NOTE — Progress Notes (Unsigned)
Electrophysiology Office Follow up Visit Note:    Date:  12/27/2021   ID:  James [name], DOB April 18, 1946, MRN [id number]  PCP:  Tonia Ghent, MD  [facility] Cardiologist:  Nelva Bush, MD  [facility] Electrophysiologist:  Vickie Epley, MD    Interval History:    James [name] is a 76 y.o. male who presents for a follow up visit. He last saw donna 11/17/2021. I Saw him 06/07/2021. He has a thoracic aortic aneurysm. He takes dofetilide for his AF.   He tells me he is done well since starting Tikosyn without recurrence of his arrhythmia.  He is tolerating his Eliquis twice daily without any bleeding issues.       Past Medical History:  Diagnosis Date   (HFpEF) heart failure with preserved ejection fraction ([place])    a. 05/2018 Echo: EF 55-60%, gr2 DD.   Acute lower GI bleeding after colonoscopy and polypectomy, resolved 12/12/2015   Ascending aortic aneurysm ([place])    a. 02/2018 Echo: mildly dil Ao root/asc ao/arch; b. 05/2018 CTA Chest: 4.7cm fusiform aneurysm of Asc Ao.   CAD (coronary artery disease)    a. 05/2018 Cath/PCI: LM nl,LAD 30p, 90/99d/apical, LCX 63m OM1/2/3 min irregs, RCA 85p (3.5x18 [place].   Cardiac murmur    a. 02/2018 Echo: EF 60-65%, no rwma, mild AI, mildly dil Ao root/Asc Ao/Arch, Mild MR, mildly dil LA. Nl RV fxn.   Cataract    bilateral surgery to remove   CHF (congestive heart failure) (HCC)    Colon polyp    Constipation    Essential hypertension    Hemorrhoids    Hepatitis    self resolved, likely food exposure, 1974.    History of cardiovascular stress test    a. 02/2018 Myoview: EF 55-65%, small/mild apical defect w/ nl wall motion ->attenuation artifact. No ischemia. Low risk study.   Hx of adenomatous colonic polyps 12/05/2015   Hyperlipidemia    Hypothyroidism    Mitral regurgitation    a. 110/2019 Echo: EF 55-60%. Gr2 DD. Triv AI. Ao root 390m Mild MR. Mod dil LA, mildly dil RA.   Osteoarthritis    Persistent  atrial fibrillation ([facility]   a. Dx 02/2018. CHA2DS2VASc = 2-->Eliquis; b. 03/2018 DCCV (200J); c. 03/2018 recurrent Afib-->flecainide started 04/2018;  d. 05/06/2018 s/p successful DCCV - 200J x 2-->on amio.   Skin cancer    basal cell, R ear, MOHS    Past Surgical History:  Procedure Laterality Date   BROW LIFT Bilateral 10/23/2016   Procedure: BLEPHAROPLASTY upper eyelid with excess skin;  Surgeon: AmKarle StarchMD;  [facility] Service: Ophthalmology;  Laterality: Bilateral;  MAC   CARDIOVERSION N/A 03/18/2018   Procedure: CARDIOVERSION;  Surgeon: EnNelva BushMD;  [facility];  Service: Cardiovascular;  Laterality: N/A;   CARDIOVERSION N/A 05/06/2018   Procedure: CARDIOVERSION;  Surgeon: EnNelva BushMD;  Location: [facility] ORS;  Service: Cardiovascular;  Laterality: N/A;   CATARACT EXTRACTION  1986   OD   CATARACT EXTRACTION Bilateral 1995   x 2 for right and left    COLONOSCOPY     CORONARY STENT INTERVENTION N/A 05/13/2018   Procedure: CORONARY STENT INTERVENTION;  Surgeon: EnNelva BushMD;  [facility];  Service: Cardiovascular;  Laterality: N/A;   ECTROPION REPAIR Bilateral 10/23/2016   Procedure: REPAIR OF ECTROPION sutures, extensive;  Surgeon: AmKarle StarchMD;  Location: [facility] Service: Ophthalmology;  Laterality: Bilateral;   LIPOMA EXCISION  06/1997   left neck ([place])   MOHS SURGERY Right 2013   behind right ear   RIGHT/LEFT HEART CATH AND CORONARY ANGIOGRAPHY N/A 05/12/2018   Procedure: RIGHT/LEFT HEART CATH AND CORONARY ANGIOGRAPHY;  Surgeon: Wellington Hampshire, MD;  Location: [facility];  Service: Cardiovascular;  Laterality: N/A;   TONSILLECTOMY     VASECTOMY  1982    Current Medications: Current Meds  Medication Sig   atorvastatin (LIPITOR) 80 MG tablet TAKE 1 TABLET BY MOUTH DAILY AT 6 PM   diazepam (VALIUM) 10 MG tablet Take 1 tablet by mouth 30 to 60 minutes before MRI scan.  Do not  operate motor vehicle while taking this medication.   diclofenac Sodium (VOLTAREN) 1 % GEL Apply 2 g topically 4 (four) times daily as needed.   dofetilide (TIKOSYN) 500 MCG capsule Take 1 capsule (500 mcg total) by mouth 2 (two) times daily.   doxazosin (CARDURA) 4 MG tablet TAKE 1 TABLET BY MOUTH DAILY AFTER BREAKAST   ELIQUIS 5 MG TABS tablet TAKE 1 TABLET BY MOUTH TWICE DAILY   ezetimibe (ZETIA) 10 MG tablet TAKE 1 TABLET BY MOUTH DAILY.   finasteride (PROSCAR) 5 MG tablet TAKE 1 TABLET BY MOUTH EVERY DAY   furosemide (LASIX) 20 MG tablet Take 1 tablet (20 mg total) by mouth every other day   ibuprofen (ADVIL) 200 MG tablet Take 400 mg by mouth every 6 (six) hours as needed for moderate pain.   levothyroxine (SYNTHROID) 150 MCG tablet Take 1 tablet (150 mcg total) by mouth daily before breakfast. Except take 1/2 tablet on Sundays.  Total of 6.5 tablets in 1 week.   Multiple Vitamin (MULTIVITAMIN WITH MINERALS) TABS tablet Take 1 tablet by mouth daily. Senior Multivitamin   polyethylene glycol (MIRALAX) 17 g packet Take 17 g by mouth 2 (two) times daily.   psyllium (METAMUCIL) 58.6 % packet Take 1 packet by mouth daily.   ramipril (ALTACE) 10 MG capsule TAKE ONE CAPSULE BY MOUTH DAILY GENERIC EQUIVALENT FOR ALTACE   White Petrolatum-Mineral Oil (REFRESH P.M. OP) Place 1 drop into both eyes daily as needed (dry eyes).     Allergies:   Sulfonamide derivatives   Social History   Socioeconomic History   Marital status: Married    Spouse name: Remo Lipps   Number of children: 1   Years of education: 14   Highest education level: Associate degree: occupational, Hotel manager, or vocational program  Occupational History   Occupation: Retired    Fish farm manager: RETIRED    Comment: 1  Tobacco Use   Smoking status: Former    Packs/day: 2.00    Years: 20.00    Pack years: 40.00    Types: Cigarettes    Quit date: 08/07/1983    Years since quitting: 38.4    Passive exposure: Past   Smokeless tobacco:  Never   Tobacco comments:    Former smoker 11/17/21 quit over 40 years ago  Vaping Use   Vaping Use: Never used  Substance and Sexual Activity   Alcohol use: Yes    Alcohol/week: 7.0 standard drinks    Types: 7 Shots of liquor per week    Comment: 1 at night before bed 09/28/21   Drug use: No   Sexual activity: Yes    Partners: Female  Other Topics Concern   Not on file  Social History Narrative   Married and lives with wife 1974   One daughter, not local  Retired from [employer], sea based telecommunication/contracting   Social Determinants of Radio broadcast assistant Strain: Low Risk    Difficulty of Paying Living Expenses: Not hard at all  Food Insecurity: No Food Insecurity   Worried About Charity fundraiser in the Last Year: Never true   Arboriculturist in the Last Year: Never true  Transportation Needs: No Transportation Needs   Lack of Transportation (Medical): No   Lack of Transportation (Non-Medical): No  Physical Activity: Insufficiently Active   Days of Exercise per Week: 7 days   Minutes of Exercise per Session: 10 min  Stress: No Stress Concern Present   Feeling of Stress : Not at all  Social Connections: Moderately Integrated   Frequency of Communication with Friends and Family: Three times a week   Frequency of Social Gatherings with Friends and Family: Once a week   Attends Religious Services: Never   Marine scientist or Organizations: Yes   Attends Archivist Meetings: Never   Marital Status: Married     Family History: The patient's family history includes Heart disease in his paternal grandfather; Lung cancer in his maternal grandfather; Stroke in his maternal grandfather and mother. There is no history of Colon cancer, Colon polyps, Esophageal cancer, Rectal cancer, Stomach cancer, Bladder Cancer, or Prostate cancer.  ROS:   Please see the history of present illness.    All other systems reviewed and are negative.  EKGs/Labs/Other  Studies Reviewed:    The following studies were reviewed today:   EKG:  The ekg ordered today demonstrates sinus rhythm.  QTc is 465 ms.  Recent Labs: 05/25/2021: ALT 12; TSH 1.200 07/08/2021: B Natriuretic Peptide 120.5; Hemoglobin 14.5; Platelets 136 11/17/2021: BUN 22; Creatinine, Ser 1.35; Magnesium 2.1; Potassium 4.6; Sodium 141  Recent Lipid Panel    Component Value Date/Time   CHOL 126 05/25/2021 0859   TRIG 76 05/25/2021 0859   HDL 48 05/25/2021 0859   CHOLHDL 2.6 05/25/2021 0859   CHOLHDL 2.6 04/28/2020 0842   VLDL 12 04/28/2020 0842   LDLCALC 63 05/25/2021 0859   LDLDIRECT 63 05/25/2021 0859    Physical Exam:    VS:  BP 132/68   Pulse 76   Ht 6' (1.829 m)   Wt 267 lb (121.1 kg)   SpO2 97%   BMI 36.21 kg/m     Wt Readings from Last 3 Encounters:  12/27/21 267 lb (121.1 kg)  11/23/21 267 lb (121.1 kg)  11/17/21 262 lb 12.8 oz (119.2 kg)     GEN:  Well nourished, well developed in no acute distress HEENT: Normal NECK: No JVD; No carotid bruits LYMPHATICS: No lymphadenopathy CARDIAC: RRR, no murmurs, rubs, gallops RESPIRATORY:  Clear to auscultation without rales, wheezing or rhonchi  ABDOMEN: Soft, non-tender, non-distended MUSCULOSKELETAL:  No edema; No deformity  SKIN: Warm and dry NEUROLOGIC:  Alert and oriented x 3 PSYCHIATRIC:  Normal affect        ASSESSMENT:    1. Persistent atrial fibrillation ([place])   2. Chronic heart failure with preserved ejection fraction (HFpEF) ([place])   3. Encounter for long-term (current) use of high-risk medication    PLAN:    In order of problems listed above:   #Persistent atrial fibrillation Maintaining normal rhythm on Tikosyn.  On Eliquis for stroke prophylaxis.  QTc acceptable for continued use.  I will recheck his kidney function today and have him follow-up with the A-fib clinic in 4 to 6 months.  #  Chronic diastolic heart failure Warm and dry today.  Continue Lasix.  Rhythm control as above.  #Tikosyn  monitoring Kidney function was okay in April but will recheck today.  QTc acceptable for continued use.   4-6 mo. Follow up with AF clinic.    Medication Adjustments/Labs and Tests Ordered: Current medicines are reviewed at length with the patient today.  Concerns regarding medicines are outlined above.  No orders of the defined types were placed in this encounter.  No orders of the defined types were placed in this encounter.    Signed, Lars Mage, MD, [facility], [facility] 12/27/2021 8:44 AM    Electrophysiology [facility]

## 2021-12-27 ENCOUNTER — Ambulatory Visit: Payer: Medicare Other | Admitting: Cardiology

## 2021-12-27 ENCOUNTER — Other Ambulatory Visit
Admission: RE | Admit: 2021-12-27 | Discharge: 2021-12-27 | Disposition: A | Discharge status: Home / Self Care | Payer: Medicare Other | Attending: Cardiology | Admitting: Cardiology

## 2021-12-27 ENCOUNTER — Telehealth: Payer: Self-pay | Admitting: Cardiology

## 2021-12-27 ENCOUNTER — Encounter: Payer: Self-pay | Admitting: Cardiology

## 2021-12-27 VITALS — BP 132/68 | HR 76 | Ht 72.0 in | Wt 267.0 lb

## 2021-12-27 DIAGNOSIS — I4819 Other persistent atrial fibrillation: Secondary | ICD-10-CM | POA: Diagnosis not present

## 2021-12-27 DIAGNOSIS — Z79899 Other long term (current) drug therapy: Secondary | ICD-10-CM | POA: Insufficient documentation

## 2021-12-27 DIAGNOSIS — I5032 Chronic diastolic (congestive) heart failure: Secondary | ICD-10-CM | POA: Insufficient documentation

## 2021-12-27 LAB — BASIC METABOLIC PANEL
Anion gap: 8 (ref 5–15)
BUN: 21 mg/dL (ref 8–23)
CO2: 26 mmol/L (ref 22–32)
Calcium: 9.4 mg/dL (ref 8.9–10.3)
Chloride: 108 mmol/L (ref 98–111)
Creatinine, Ser: 1.37 mg/dL — ABNORMAL HIGH (ref 0.61–1.24)
GFR, Estimated: 54 mL/min — ABNORMAL LOW (ref >60)
Glucose, Bld: 105 mg/dL — ABNORMAL HIGH (ref 70–99)
Potassium: 3.9 mmol/L (ref 3.5–5.1)
Sodium: 142 mmol/L (ref 135–145)

## 2021-12-27 LAB — MAGNESIUM: Magnesium: 2.1 mg/dL (ref 1.7–2.4)

## 2021-12-27 NOTE — Patient Instructions (Signed)
Medications: Your physician recommends that you continue on your current medications as directed. Please refer to the Current Medication list given to you today. *If you need a refill on your cardiac medications before your next appointment, please call your pharmacy*  Lab Work: BMP, MAG - Nature conservation officer at [facility] 1st desk on the right to check in Lab hours: 7:30 am- 5:30 pm (walk in basis)  If you have labs (blood work) drawn today and your tests are completely normal, you will receive your results only by: MyChart Message (if you have MyChart) OR A paper copy in the mail If you have any lab test that is abnormal or we need to change your treatment, we will call you to review the results.  Testing/Procedures: None.  Follow-Up: At [facility], you and your health needs are our priority.  As part of our continuing mission to provide you with exceptional heart care, we have created designated Provider Care Teams.  These Care Teams include your primary Cardiologist (physician) and Advanced Practice Providers (APPs -  Physician Assistants and Nurse Practitioners) who all work together to provide you with the care you need, when you need it.  Your physician wants you to follow-up in: 4-6 months in the Afib Clinic.   We recommend signing up for the patient portal called "MyChart".  Sign up information is provided on this After Visit Summary.  MyChart is used to connect with patients for Virtual Visits (Telemedicine).  Patients are able to view lab/test results, encounter notes, upcoming appointments, etc.  Non-urgent messages can be sent to your provider as well.   To learn more about what you can do with MyChart, go to [email or web address].    Any Other Special Instructions Will Be Listed Below (If Applicable).

## 2021-12-27 NOTE — Telephone Encounter (Signed)
Attempted to schedule appt with Afib clinic in 4-6 mths .   Lmov for clinic to schedule with patient .

## 2022-01-10 ENCOUNTER — Other Ambulatory Visit: Payer: Self-pay | Admitting: *Deleted

## 2022-01-10 MED ORDER — ATORVASTATIN CALCIUM 80 MG PO TABS
ORAL_TABLET | ORAL | 0 refills | Status: DC
Start: 2022-01-10 — End: 2022-03-27

## 2022-01-12 ENCOUNTER — Telehealth: Payer: Self-pay

## 2022-01-12 MED ORDER — FUROSEMIDE 20 MG PO TABS: ORAL_TABLET | ORAL | 1 refills | Status: DC

## 2022-01-12 MED ORDER — LEVOTHYROXINE SODIUM 150 MCG PO TABS: 150.0000 ug | ORAL_TABLET | Freq: Every day | ORAL | 3 refills | Status: DC

## 2022-01-12 NOTE — Telephone Encounter (Signed)
Patient needs refill on Levothyroxine 150 mcg tablets and furosemide 20 mg tablets sent to [facility].

## 2022-01-17 ENCOUNTER — Other Ambulatory Visit: Payer: Self-pay

## 2022-01-17 MED ORDER — LEVOTHYROXINE SODIUM 150 MCG PO TABS
150.0000 ug | ORAL_TABLET | Freq: Every day | ORAL | 3 refills | Status: DC
Start: 2022-01-17 — End: 2022-03-22

## 2022-01-17 NOTE — Telephone Encounter (Signed)
Patient called they did not get script for Levothyroxine, I have resent. He will call if any issues.

## 2022-03-20 ENCOUNTER — Telehealth (HOSPITAL_COMMUNITY): Payer: Self-pay

## 2022-03-20 NOTE — Telephone Encounter (Signed)
Patient called in because he recently started having itching from the waist up . He wanted to make sure this is not related to a kidney problem. He read online if you are having a full body itch this could be related to a kidney problem. He was started on Tikosyn back in April. Patient wanted to make sure this is not related to the Tikosyn medication. Consulted with [name] -NP advise patient this not related to Tikosyn. Tikosyn is filtered through the kidneys. This medication is not known to cause kidney problems. Instructed patient to contact his primary care doctor. Communicated with patient and he verbalized understanding.

## 2022-03-21 ENCOUNTER — Ambulatory Visit (INDEPENDENT_AMBULATORY_CARE_PROVIDER_SITE_OTHER): Payer: Medicare Other | Admitting: Family Medicine

## 2022-03-21 ENCOUNTER — Encounter: Payer: Self-pay | Admitting: Family Medicine

## 2022-03-21 VITALS — BP 136/72 | HR 54 | Temp 97.8°F | Ht 72.0 in | Wt 257.4 lb

## 2022-03-21 DIAGNOSIS — L299 Pruritus, unspecified: Secondary | ICD-10-CM

## 2022-03-21 LAB — COMPREHENSIVE METABOLIC PANEL
ALT: 9 U/L (ref 0–53)
AST: 13 U/L (ref 0–37)
Albumin: 3.7 g/dL (ref 3.5–5.2)
Alkaline Phosphatase: 60 U/L (ref 39–117)
BUN: 22 mg/dL (ref 6–23)
CO2: 28 mEq/L (ref 19–32)
Calcium: 9.5 mg/dL (ref 8.4–10.5)
Chloride: 107 mEq/L (ref 96–112)
Creatinine, Ser: 1.28 mg/dL (ref 0.40–1.50)
GFR: 55.08 mL/min — ABNORMAL LOW (ref >60.00)
Glucose, Bld: 79 mg/dL (ref 70–99)
Potassium: 4.5 mEq/L (ref 3.5–5.1)
Sodium: 141 mEq/L (ref 135–145)
Total Bilirubin: 1.1 mg/dL (ref 0.2–1.2)
Total Protein: 5.9 g/dL — ABNORMAL LOW (ref 6.0–8.3)

## 2022-03-21 LAB — CBC WITH DIFFERENTIAL/PLATELET
Basophils Absolute: 0 10*3/uL (ref 0.0–0.1)
Basophils Relative: 0.4 % (ref 0.0–3.0)
Eosinophils Absolute: 0.1 10*3/uL (ref 0.0–0.7)
Eosinophils Relative: 1.1 % (ref 0.0–5.0)
HCT: 42.5 % (ref 39.0–52.0)
Hemoglobin: 14.2 g/dL (ref 13.0–17.0)
Lymphocytes Relative: 46 % (ref 12.0–46.0)
Lymphs Abs: 3.6 10*3/uL (ref 0.7–4.0)
MCHC: 33.5 g/dL (ref 30.0–36.0)
MCV: 91.9 fl (ref 78.0–100.0)
Monocytes Absolute: 0.6 10*3/uL (ref 0.1–1.0)
Monocytes Relative: 7.9 % (ref 3.0–12.0)
Neutro Abs: 3.5 10*3/uL (ref 1.4–7.7)
Neutrophils Relative %: 44.6 % (ref 43.0–77.0)
Platelets: 139 10*3/uL — ABNORMAL LOW (ref 150.0–400.0)
RBC: 4.62 Mil/uL (ref 4.22–5.81)
RDW: 13.5 % (ref 11.5–15.5)
WBC: 7.8 10*3/uL (ref 4.0–10.5)

## 2022-03-21 LAB — TSH: TSH: 0.1 u[IU]/mL — ABNORMAL LOW (ref 0.35–5.50)

## 2022-03-21 LAB — SEDIMENTATION RATE: Sed Rate: 2 mm/hr (ref 0–20)

## 2022-03-21 MED ORDER — HYDROXYZINE HCL 10 MG PO TABS: 10.0000 mg | ORAL_TABLET | Freq: Two times a day (BID) | ORAL | 0 refills | Status: DC | PRN

## 2022-03-21 NOTE — Patient Instructions (Addendum)
Labs today  Recommend regular moisturizing to skin.  Take claritin daily for itch. If this doesn't help, may try hydroxyzine prescription printed out today - with sedation precautions.  Let us know if not improving with this.   Pruritus Pruritus is an itchy feeling on the skin. One of the most common causes is dry skin, but many different things can cause itching. Most cases of itching do not require medical attention. Sometimes itchy skin can turn into a rash or a secondary infection. Follow these instructions at home: Skin care  Do not use scented soaps, detergents, perfumes, and cosmetic products. Instead, use gentle, unscented versions of these items. Apply moisturizing creams to your skin frequently, at least twice daily. Apply immediately after bathing while skin is still wet. Take medicines or apply medicated creams only as told by your health care provider. This may include: Corticosteroid cream or topical calcineurin inhibitor. Anti-itch lotions containing urea, camphor, or menthol. Oral antihistamines. Do not take hot showers or baths, which can make itching worse. A short, cool shower may help with itching as long as you apply moisturizing lotion after the shower. Apply a cool, wet cloth (cool compress) to the affected areas. You may take lukewarm baths with one of the following: Epsom salts. You can get these at your local pharmacy or grocery store. Follow the instructions on the packaging. Baking soda. Pour a small amount into the bath as told by your health care provider. Colloidal oatmeal. You can get this at your local pharmacy or grocery store. Follow the instructions on the packaging. Do not scratch your skin. General instructions Avoid wearing tight clothes. Keep a journal to help find out what is causing your itching. Write down: What you eat and drink. What cosmetic products you use. What soaps or detergents you use. What you wear, including jewelry. Use a  humidifier. This keeps the air moist, which helps to prevent dry skin. Be aware of any changes in your itchiness. Tell your health care provider about any changes. Contact a health care provider if: The itching does not go away after several days. You notice redness, warmth, or drainage on the skin where you have scratched. You are unusually thirsty or urinating more than normal. Your skin tingles or feels numb. Your skin or the white parts of your eyes turn yellow (jaundice). You feel weak. You have any of the following: Night sweats. Tiredness (fatigue). Weight loss. Abdominal pain. Summary Pruritus is an itchy feeling on the skin. One of the most common causes is dry skin, but many different conditions and factors can cause itching. Apply moisturizing creams to your skin frequently, at least twice daily. Apply immediately after bathing while skin is still wet. Take medicines or apply medicated creams only as told by your health care provider. Do not take hot showers or baths. Do not use scented soaps, detergents, perfumes, or cosmetic products. Keep a journal to help find out what is causing your itching. This information is not intended to replace advice given to you by your health care provider. Make sure you discuss any questions you have with your health care provider. Document Revised: 08/30/2021 Document Reviewed: 08/30/2021 Elsevier Patient Education  [place].

## 2022-03-21 NOTE — Assessment & Plan Note (Signed)
Pruritis without rash of unclear cause.  Check labwork today. Try claritin and if not effective, WASP for hydroxyzine 10-'20mg'$  BID PRN printed out today, discussed sedation precautions.  Discussed moisturizing of skin.

## 2022-03-21 NOTE — Progress Notes (Signed)
Patient ID: James [name], male    DOB: [birth year], 76 y.o.   MRN: [id number]  This visit was conducted in person.  BP 136/72   Pulse (!) 54   Temp 97.8 F (36.6 C) (Temporal)   Ht 6' (1.829 m)   Wt 257 lb 6 oz (116.7 kg)   SpO2 98%   BMI 34.91 kg/m    CC: pruritis  Subjective:   HPI: James [name] is a 76 y.o. male presenting on 03/21/2022 for Pruritis (C/o itching from waist up, including head.  Started about 1 wk ago. Has not tried any OTC tx. )   1 wk h/o diffuse itching to body from waist up, including head and scalp. No associated rash.   No fevers/chills, nausea, abd pain, rash, HA, new joint pains.   No other new meds, vitamins, supplements, OTC, foods. No new lotions, detergents, soaps or shampoos. Uses Dove soap and tide detergent.  No change in levothyroxine pills.   No history of kidney or liver disease.  Not on amiodarone.   Tikosyn started 4-5 months ago. Cards didn't think related to med.  10 lb weight loss noted over the past 3 months - this has been intentional through healthy diet choices and increased walking.      Relevant past medical, surgical, family and social history reviewed and updated as indicated. Interim medical history since our last visit reviewed. Allergies and medications reviewed and updated. Outpatient Medications Prior to Visit  Medication Sig Dispense Refill   atorvastatin (LIPITOR) 80 MG tablet TAKE 1 TABLET BY MOUTH DAILY AT 6 PM 90 tablet 0   diazepam (VALIUM) 10 MG tablet Take 1 tablet by mouth 30 to 60 minutes before MRI scan.  Do not operate motor vehicle while taking this medication. 2 tablet 0   diclofenac Sodium (VOLTAREN) 1 % GEL Apply 2 g topically 4 (four) times daily as needed.     dofetilide (TIKOSYN) 500 MCG capsule Take 1 capsule (500 mcg total) by mouth 2 (two) times daily. 180 capsule 2   doxazosin (CARDURA) 4 MG tablet TAKE 1 TABLET BY MOUTH DAILY AFTER BREAKAST 90 tablet 2   ELIQUIS 5 MG TABS tablet TAKE 1  TABLET BY MOUTH TWICE DAILY 180 tablet 1   ezetimibe (ZETIA) 10 MG tablet TAKE 1 TABLET BY MOUTH DAILY. 90 tablet 3   finasteride (PROSCAR) 5 MG tablet TAKE 1 TABLET BY MOUTH EVERY DAY 90 tablet 3   furosemide (LASIX) 20 MG tablet Take 1 tablet (20 mg total) by mouth every other day 45 tablet 1   ibuprofen (ADVIL) 200 MG tablet Take 400 mg by mouth every 6 (six) hours as needed for moderate pain.     levothyroxine (SYNTHROID) 150 MCG tablet Take 1 tablet (150 mcg total) by mouth daily before breakfast. Except take 1/2 tablet on Sundays.  Total of 6.5 tablets in 1 week. 100 tablet 3   Multiple Vitamin (MULTIVITAMIN WITH MINERALS) TABS tablet Take 1 tablet by mouth daily. Senior Multivitamin     polyethylene glycol (MIRALAX) 17 g packet Take 17 g by mouth 2 (two) times daily. 14 each 0   psyllium (METAMUCIL) 58.6 % packet Take 1 packet by mouth daily.     ramipril (ALTACE) 10 MG capsule TAKE ONE CAPSULE BY MOUTH DAILY GENERIC EQUIVALENT FOR ALTACE 90 capsule 3   White Petrolatum-Mineral Oil (REFRESH P.M. OP) Place 1 drop into both eyes daily as needed (dry eyes).     No facility-administered  medications prior to visit.     Per HPI unless specifically indicated in ROS section below Review of Systems  Objective:  BP 136/72   Pulse (!) 54   Temp 97.8 F (36.6 C) (Temporal)   Ht 6' (1.829 m)   Wt 257 lb 6 oz (116.7 kg)   SpO2 98%   BMI 34.91 kg/m   Wt Readings from Last 3 Encounters:  03/21/22 257 lb 6 oz (116.7 kg)  12/27/21 267 lb (121.1 kg)  11/23/21 267 lb (121.1 kg)      Physical Exam Vitals and nursing note reviewed.  Constitutional:      Appearance: Normal appearance. He is obese. He is not ill-appearing.  Neck:     Thyroid: No thyroid mass or thyromegaly.  Cardiovascular:     Rate and Rhythm: Normal rate and regular rhythm.     Pulses: Normal pulses.     Heart sounds: Normal heart sounds. No murmur heard. Pulmonary:     Effort: Pulmonary effort is normal. No respiratory  distress.     Breath sounds: Normal breath sounds. No wheezing, rhonchi or rales.  Abdominal:     General: Bowel sounds are normal. There is no distension.     Palpations: Abdomen is soft. There is no mass.     Tenderness: There is no abdominal tenderness. There is no guarding or rebound. Negative signs include Murphy's sign.     Hernia: No hernia is present.  Musculoskeletal:     Cervical back: Normal range of motion and neck supple.  Skin:    General: Skin is warm and dry.     Findings: No erythema or rash.  Neurological:     Mental Status: He is alert.  Psychiatric:        Mood and Affect: Mood normal.        Behavior: Behavior normal.       Results for orders placed or performed during the hospital encounter of 12/27/21  Magnesium  Result Value Ref Range   Magnesium 2.1 1.7 - 2.4 mg/dL  Basic metabolic panel  Result Value Ref Range   Sodium 142 135 - 145 mmol/L   Potassium 3.9 3.5 - 5.1 mmol/L   Chloride 108 98 - 111 mmol/L   CO2 26 22 - 32 mmol/L   Glucose, Bld 105 (H) 70 - 99 mg/dL   BUN 21 8 - 23 mg/dL   Creatinine, Ser 1.37 (H) 0.61 - 1.24 mg/dL   Calcium 9.4 8.9 - 10.3 mg/dL   GFR, Estimated 54 (L) >60 mL/min   Anion gap 8 5 - 15    Assessment & Plan:   Problem List Items Addressed This Visit     Pruritus - Primary    Pruritis without rash of unclear cause.  Check labwork today. Try claritin and if not effective, WASP for hydroxyzine 10-'20mg'$  BID PRN printed out today, discussed sedation precautions.  Discussed moisturizing of skin.       Relevant Orders   Sedimentation rate   Comprehensive metabolic panel   TSH   CBC with Differential/Platelet     Meds ordered this encounter  Medications   hydrOXYzine (ATARAX) 10 MG tablet    Sig: Take 1-2 tablets (10-20 mg total) by mouth 2 (two) times daily as needed for itching.    Dispense:  30 tablet    Refill:  0   Orders Placed This Encounter  Procedures   Sedimentation rate   Comprehensive metabolic  panel   TSH  CBC with Differential/Platelet     Patient instructions: Labs today  Recommend regular moisturizing to skin.  Take claritin daily for itch. If this doesn't help, may try hydroxyzine prescription printed out today - with sedation precautions.  Let us know if not improving with this.   Follow up plan: Return if symptoms worsen or fail to improve.  Ria Bush, MD

## 2022-03-22 ENCOUNTER — Other Ambulatory Visit: Payer: Self-pay | Admitting: Family Medicine

## 2022-03-22 ENCOUNTER — Telehealth: Payer: Self-pay

## 2022-03-22 ENCOUNTER — Encounter: Payer: Self-pay | Admitting: Family Medicine

## 2022-03-22 DIAGNOSIS — E039 Hypothyroidism, unspecified: Secondary | ICD-10-CM

## 2022-03-22 MED ORDER — LEVOTHYROXINE SODIUM 137 MCG PO TABS: 137.0000 ug | ORAL_TABLET | Freq: Every day | ORAL | 3 refills | Status: DC

## 2022-03-22 NOTE — Telephone Encounter (Signed)
Prior auth started for hydrOXYzine HCl '10MG'$  tablets. [facility] Key: [id number] - Rx #: [id number] Waiting for determination.

## 2022-03-22 NOTE — Telephone Encounter (Signed)
Prior auth for hydrOXYzine HCl '10MG'$  tablets has been approved. James [name] Key: [id number] - Rx #: [id number] Approved today Effective from 03/22/2022 through 03/23/2023.  Notified patient via mychart.

## 2022-03-23 NOTE — Telephone Encounter (Signed)
I'm not sure about the new dosing and it looks like the hydroxyzine was approved by insurance yesterday. Please advised on his Sunday dosage he is questioning.

## 2022-03-25 ENCOUNTER — Other Ambulatory Visit: Payer: Self-pay | Admitting: Family Medicine

## 2022-03-25 DIAGNOSIS — E039 Hypothyroidism, unspecified: Secondary | ICD-10-CM

## 2022-03-26 NOTE — Telephone Encounter (Signed)
Sent approval letter to scanning.

## 2022-03-27 ENCOUNTER — Other Ambulatory Visit: Payer: Self-pay

## 2022-03-27 MED ORDER — ATORVASTATIN CALCIUM 80 MG PO TABS: ORAL_TABLET | ORAL | 0 refills | Status: DC

## 2022-04-23 ENCOUNTER — Other Ambulatory Visit: Payer: Self-pay | Admitting: Internal Medicine

## 2022-04-25 ENCOUNTER — Other Ambulatory Visit (INDEPENDENT_AMBULATORY_CARE_PROVIDER_SITE_OTHER): Payer: Medicare Other

## 2022-04-25 ENCOUNTER — Other Ambulatory Visit: Payer: Self-pay | Admitting: Family Medicine

## 2022-04-25 DIAGNOSIS — E039 Hypothyroidism, unspecified: Secondary | ICD-10-CM | POA: Diagnosis not present

## 2022-04-25 LAB — TSH: TSH: 0.15 u[IU]/mL — ABNORMAL LOW (ref 0.35–5.50)

## 2022-04-29 ENCOUNTER — Other Ambulatory Visit: Payer: Self-pay | Admitting: Family Medicine

## 2022-04-29 MED ORDER — LEVOTHYROXINE SODIUM 125 MCG PO TABS
125.0000 ug | ORAL_TABLET | Freq: Every day | ORAL | 1 refills | Status: DC
Start: 2022-04-29 — End: 2022-05-27

## 2022-04-30 ENCOUNTER — Telehealth: Payer: Self-pay | Admitting: Family Medicine

## 2022-04-30 NOTE — Telephone Encounter (Signed)
Patient called in returning a call he received from [place]. Informed patient of the decrease in medication but wasn't sure if that was all. Thank you!

## 2022-05-01 NOTE — Telephone Encounter (Signed)
James [name] spoke with patient yesterday after message was sent. See results note.

## 2022-05-25 ENCOUNTER — Encounter: Payer: Self-pay | Admitting: Internal Medicine

## 2022-05-25 ENCOUNTER — Ambulatory Visit: Payer: Medicare Other | Attending: Internal Medicine | Admitting: Internal Medicine

## 2022-05-25 ENCOUNTER — Other Ambulatory Visit
Admission: RE | Admit: 2022-05-25 | Discharge: 2022-05-25 | Disposition: A | Discharge status: Home / Self Care | Payer: Medicare Other | Attending: Internal Medicine | Admitting: Internal Medicine

## 2022-05-25 VITALS — BP 100/70 | HR 102 | Ht 72.0 in | Wt 257.0 lb

## 2022-05-25 DIAGNOSIS — I2510 Atherosclerotic heart disease of native coronary artery without angina pectoris: Secondary | ICD-10-CM | POA: Diagnosis not present

## 2022-05-25 DIAGNOSIS — I4891 Unspecified atrial fibrillation: Secondary | ICD-10-CM | POA: Diagnosis not present

## 2022-05-25 LAB — CBC
HCT: 44 % (ref 39.0–52.0)
Hemoglobin: 14.6 g/dL (ref 13.0–17.0)
MCH: 30.2 pg (ref 26.0–34.0)
MCHC: 33.2 g/dL (ref 30.0–36.0)
MCV: 91.1 fL (ref 80.0–100.0)
Platelets: 165 10*3/uL (ref 150–400)
RBC: 4.83 MIL/uL (ref 4.22–5.81)
RDW: 12.9 % (ref 11.5–15.5)
WBC: 9 10*3/uL (ref 4.0–10.5)
nRBC: 0 % (ref 0.0–0.2)

## 2022-05-25 LAB — BASIC METABOLIC PANEL
Anion gap: 7 (ref 5–15)
BUN: 23 mg/dL (ref 8–23)
CO2: 25 mmol/L (ref 22–32)
Calcium: 9.4 mg/dL (ref 8.9–10.3)
Chloride: 110 mmol/L (ref 98–111)
Creatinine, Ser: 1.31 mg/dL — ABNORMAL HIGH (ref 0.61–1.24)
GFR, Estimated: 57 mL/min — ABNORMAL LOW (ref >60)
Glucose, Bld: 113 mg/dL — ABNORMAL HIGH (ref 70–99)
Potassium: 4.2 mmol/L (ref 3.5–5.1)
Sodium: 142 mmol/L (ref 135–145)

## 2022-05-25 LAB — LIPID PANEL
Cholesterol: 121 mg/dL (ref 0–200)
HDL: 41 mg/dL (ref >40)
LDL Cholesterol: 66 mg/dL (ref 0–99)
Total CHOL/HDL Ratio: 3 RATIO
Triglycerides: 70 mg/dL (ref <150)
VLDL: 14 mg/dL (ref 0–40)

## 2022-05-25 LAB — MAGNESIUM: Magnesium: 2 mg/dL (ref 1.7–2.4)

## 2022-05-25 LAB — TSH: TSH: 0.01 u[IU]/mL — ABNORMAL LOW (ref 0.350–4.500)

## 2022-05-25 MED ORDER — METOPROLOL SUCCINATE ER 25 MG PO TB24: 25.0000 mg | ORAL_TABLET | Freq: Every day | ORAL | 3 refills | Status: DC

## 2022-05-25 NOTE — Progress Notes (Signed)
Follow-up Outpatient Visit Date: 05/25/2022  Primary Care Provider: Tonia Ghent, MD [place] [address]  Chief Complaint: Follow-up coronary artery disease and atrial fibrillation  HPI:  James [name] is a 76 y.o. male with history of coronary artery disease status [name] PCI to the RCA (2019), persistent atrial fibrillation, thoracic aortic aneurysm, HFpEF, hypertension, hyperlipidemia, hypothyroidism, and obesity, who presents for follow-up of CAD, HFpEF, and a-fib.  I last saw him in April at which time he reported a single episode of transient dizziness the day after he completed dofetilide.  He was otherwise feeling well.  We did not make any medication changes or pursue additional testing.  He was doing well at the time of his follow-up with Dr. Quentin Ore in May without symptoms to suggest recurrent a-fib.  Today, James [name] reports that he is doing well.  He felt a little bit "yucky" last month and found himself to be in atrial fibrillation using his [facility] device.  However, by the next morning he had converted back to sinus rhythm.  He has not had any chest pain, shortness of breath, palpitations, lightheadedness, edema, or bleeding.  He notes that his TSH has been somewhat suppressed and that Dr. Damita Dunnings decreased levothyroxine last month.  --------------------------------------------------------------------------------------------------  Past Medical History:  Diagnosis Date   (HFpEF) heart failure with preserved ejection fraction ([place])    a. 05/2018 Echo: EF 55-60%, gr2 DD.   Acute lower GI bleeding after colonoscopy and polypectomy, resolved 12/12/2015   Ascending aortic aneurysm ([place])    a. 02/2018 Echo: mildly dil Ao root/asc ao/arch; b. 05/2018 CTA Chest: 4.7cm fusiform aneurysm of Asc Ao.   CAD (coronary artery disease)    a. 05/2018 Cath/PCI: LM nl,LAD 30p, 90/99d/apical, LCX 90m OM1/2/3 min irregs, RCA 85p (3.5x18 [place].   Cardiac murmur     a. 02/2018 Echo: EF 60-65%, no rwma, mild AI, mildly dil Ao root/Asc Ao/Arch, Mild MR, mildly dil LA. Nl RV fxn.   Cataract    bilateral surgery to remove   CHF (congestive heart failure) (HCC)    Colon polyp    Constipation    Essential hypertension    Hemorrhoids    Hepatitis    self resolved, likely food exposure, 1974.    History of cardiovascular stress test    a. 02/2018 Myoview: EF 55-65%, small/mild apical defect w/ nl wall motion ->attenuation artifact. No ischemia. Low risk study.   Hx of adenomatous colonic polyps 12/05/2015   Hyperlipidemia    Hypothyroidism    Mitral regurgitation    a. 110/2019 Echo: EF 55-60%. Gr2 DD. Triv AI. Ao root 346m Mild MR. Mod dil LA, mildly dil RA.   Osteoarthritis    Persistent atrial fibrillation ([facility]   a. Dx 02/2018. CHA2DS2VASc = 2-->Eliquis; b. 03/2018 DCCV (200J); c. 03/2018 recurrent Afib-->flecainide started 04/2018;  d. 05/06/2018 s/p successful DCCV - 200J x 2-->on amio.   Skin cancer    basal cell, R ear, MOHS   Past Surgical History:  Procedure Laterality Date   BROW LIFT Bilateral 10/23/2016   Procedure: BLEPHAROPLASTY upper eyelid with excess skin;  Surgeon: AmKarle StarchMD;  [place] [facility] Service: Ophthalmology;  Laterality: Bilateral;  MAC   CARDIOVERSION N/A 03/18/2018   Procedure: CARDIOVERSION;  Surgeon: EnNelva BushMD;  [facility] [place];  Service: Cardiovascular;  Laterality: N/A;   CARDIOVERSION N/A 05/06/2018   Procedure: CARDIOVERSION;  Surgeon: EnNelva BushMD;  [facility];  Service: Cardiovascular;  Laterality: N/A;   CATARACT EXTRACTION  1986   OD   CATARACT EXTRACTION Bilateral 1995   x 2 for right and left    COLONOSCOPY     CORONARY STENT INTERVENTION N/A 05/13/2018   Procedure: CORONARY STENT INTERVENTION;  Surgeon: Nelva Bush, MD;  Location: [place] CV LAB;  Service: Cardiovascular;  Laterality: N/A;   ECTROPION REPAIR Bilateral 10/23/2016   Procedure: REPAIR  OF ECTROPION sutures, extensive;  Surgeon: Karle Starch, MD;  Location: [place];  Service: Ophthalmology;  Laterality: Bilateral;   LIPOMA EXCISION  06/1997   left neck (Juengel)   MOHS SURGERY Right 2013   behind right ear   RIGHT/LEFT HEART CATH AND CORONARY ANGIOGRAPHY N/A 05/12/2018   Procedure: RIGHT/LEFT HEART CATH AND CORONARY ANGIOGRAPHY;  Surgeon: Wellington Hampshire, MD;  Location: [place] CV LAB;  Service: Cardiovascular;  Laterality: N/A;   TONSILLECTOMY     VASECTOMY  1982    Current Meds  Medication Sig   atorvastatin (LIPITOR) 80 MG tablet TAKE 1 TABLET BY MOUTH DAILY AT 6 PM   diazepam (VALIUM) 10 MG tablet Take 1 tablet by mouth 30 to 60 minutes before MRI scan.  Do not operate motor vehicle while taking this medication.   diclofenac Sodium (VOLTAREN) 1 % GEL Apply 2 g topically 4 (four) times daily as needed.   dofetilide (TIKOSYN) 500 MCG capsule Take 1 capsule (500 mcg total) by mouth 2 (two) times daily.   doxazosin (CARDURA) 4 MG tablet TAKE 1 TABLET BY MOUTH DAILY AFTER BREAKAST   ELIQUIS 5 MG TABS tablet TAKE 1 TABLET BY MOUTH TWICE DAILY   ezetimibe (ZETIA) 10 MG tablet TAKE 1 TABLET BY MOUTH DAILY   finasteride (PROSCAR) 5 MG tablet TAKE 1 TABLET BY MOUTH EVERY DAY   furosemide (LASIX) 20 MG tablet Take 1 tablet (20 mg total) by mouth every other day   ibuprofen (ADVIL) 200 MG tablet Take 400 mg by mouth every 6 (six) hours as needed for moderate pain.   levothyroxine (SYNTHROID) 125 MCG tablet Take 1 tablet (125 mcg total) by mouth daily before breakfast.   Multiple Vitamin (MULTIVITAMIN WITH MINERALS) TABS tablet Take 1 tablet by mouth daily. Senior Multivitamin   psyllium (METAMUCIL) 58.6 % packet Take 1 packet by mouth daily.   ramipril (ALTACE) 10 MG capsule TAKE ONE CAPSULE BY MOUTH DAILY GENERIC EQUIVALENT FOR ALTACE   White Petrolatum-Mineral Oil (REFRESH P.M. OP) Place 1 drop into both eyes daily as needed (dry eyes).    Allergies:  Sulfonamide derivatives  Social History   Tobacco Use   Smoking status: Former    Packs/day: 2.00    Years: 20.00    Total pack years: 40.00    Types: Cigarettes    Quit date: 08/07/1983    Years since quitting: 38.8    Passive exposure: Past   Smokeless tobacco: Never   Tobacco comments:    Former smoker 11/17/21 quit over 40 years ago  Vaping Use   Vaping Use: Never used  Substance Use Topics   Alcohol use: Yes    Alcohol/week: 7.0 standard drinks of alcohol    Types: 7 Shots of liquor per week    Comment: 1 at night before bed 09/28/21   Drug use: No    Family History  Problem Relation Age of Onset   Stroke Mother    Lung cancer Maternal Grandfather        smoker  Stroke Maternal Grandfather    Heart disease Paternal Grandfather        MI, old age   Colon cancer Neg Hx    Colon polyps Neg Hx    Esophageal cancer Neg Hx    Rectal cancer Neg Hx    Stomach cancer Neg Hx    Bladder Cancer Neg Hx    Prostate cancer Neg Hx     Review of Systems: A 12-system review of systems was performed and was negative except as noted in the HPI.  --------------------------------------------------------------------------------------------------  Physical Exam: BP 100/70 (BP Location: Left Arm, Patient Position: Sitting, Cuff Size: Large)   Pulse (!) 102   Ht 6' (1.829 m)   Wt 257 lb (116.6 kg)   SpO2 98%   BMI 34.86 kg/m  Repeat heart rate: 105 bpm  General:  NAD. Neck: No JVD or HJR. Lungs: Clear to auscultation bilaterally without wheezes or crackles. Heart: Tachycardic but regular without murmurs, rubs, or gallops. Abdomen: Soft, nontender, nondistended. Extremities: Trace pretibial edema.  EKG: Sinus tachycardia with left axis deviation and inferior infarct.  Compared with prior tracing from 12/27/2021, heart rate is increased.  PVCs are no longer present.  Lab Results  Component Value Date   WBC 7.8 03/21/2022   HGB 14.2 03/21/2022   HCT 42.5 03/21/2022   MCV  91.9 03/21/2022   PLT 139.0 (L) 03/21/2022    Lab Results  Component Value Date   NA 141 03/21/2022   K 4.5 03/21/2022   CL 107 03/21/2022   CO2 28 03/21/2022   BUN 22 03/21/2022   CREATININE 1.28 03/21/2022   GLUCOSE 79 03/21/2022   ALT 9 03/21/2022    Lab Results  Component Value Date   CHOL 126 05/25/2021   HDL 48 05/25/2021   LDLCALC 63 05/25/2021   LDLDIRECT 63 05/25/2021   TRIG 76 05/25/2021   CHOLHDL 2.6 05/25/2021    --------------------------------------------------------------------------------------------------  ASSESSMENT AND PLAN: Coronary artery disease: No angina reported.  Continue secondary prevention with atorvastatin and apixaban and lieu of aspirin given paroxysmal atrial fibrillation.  I will check a lipid panel today.  Paroxysmal atrial fibrillation: James [name] is in sinus rhythm today albeit more tachycardic than usual.  He reports an episode of atrial fibrillation at home about a month ago with minimal symptoms.  We will plan to continue dofetilide and apixaban.  QTc is minimally longer than on prior tracing the heart rate is much faster today as well.  I will check a BMP and magnesium level.  He is also scheduled for follow-up in the A-fib clinic next week for ongoing management of dofetilide.  Sinus tachycardia and iatrogenic hyperthyroidism: Heart rate is noticeably elevated today, which is a distinct change for James [name] as he is typically somewhat bradycardic.  This includes heart rate in the 50s when his TSH was noted to be 0.1 in August with Dr. Damita Dunnings.  We have agreed to start metoprolol succinate 25 mg daily.  I will recheck a CBC to ensure that he has not developed anemia driving his sinus tachycardia as well as a BMP and TSH.  Further reduction of levothyroxine may be needed.  Pretibial edema is noted today though there is no evidence of overt heart failure.  Hypertension: Blood pressure borderline low today in the setting of sinus tachycardia.   We will check labs, as outlined above.  I have encouraged James [name] to stay well-hydrated.  We will add metoprolol succinate 25 mg daily and decrease doxazosin  to 2 mg daily.  Continue current dose of ramipril if tolerated.  Follow-up: Return to clinic in 3 months with me.  Keep scheduled follow-up appointments with the A-fib clinic and Dr. Quentin Ore.  Nelva Bush, MD 05/25/2022 8:12 AM

## 2022-05-25 NOTE — Patient Instructions (Addendum)
Medication Instructions:  Your physician has recommended you make the following change in your medication:   -START Metoprolol succinate 25 mg tablet by mouth once daily.  -Take Doxazosin 2 mg tablet daily. (Please cut your Doxazosin 4 mg to 1/2 tablet once daily)   *If you need a refill on your cardiac medications before your next appointment, please call your pharmacy*   Lab Work:  Your physician recommends that you have lab work TODAY:  -BMET -CBC -TSH -LIPID -MAGNESIUM  If you have labs (blood work) drawn today and your tests are completely normal, you will receive your results only by: [place] (if you have MyChart) OR A paper copy in the mail If you have any lab test that is abnormal or we need to change your treatment, we will call you to review the results.   Testing/Procedures:  none   Follow-Up: At [facility], you and your health needs are our priority.  As part of our continuing mission to provide you with exceptional heart care, we have created designated Provider Care Teams.  These Care Teams include your primary Cardiologist (physician) and Advanced Practice Providers (APPs -  Physician Assistants and Nurse Practitioners) who all work together to provide you with the care you need, when you need it.  We recommend signing up for the patient portal called "MyChart".  Sign up information is provided on this After Visit Summary.  MyChart is used to connect with patients for Virtual Visits (Telemedicine).  Patients are able to view lab/test results, encounter notes, upcoming appointments, etc.  Non-urgent messages can be sent to your provider as well.   To learn more about what you can do with MyChart, go to [email or web address].    Your next appointment:   3 month(s)  The format for your next appointment:   In Person  Provider:   You may see Nelva Bush, MD or one of the following Advanced Practice Providers on your designated Care Team:    Murray Hodgkins, NP Christell Faith, PA-C Cadence Kathlen Mody, PA-C Gerrie Nordmann, NP  Important Information About Sugar

## 2022-05-27 ENCOUNTER — Other Ambulatory Visit: Payer: Self-pay | Admitting: Family Medicine

## 2022-05-27 DIAGNOSIS — E039 Hypothyroidism, unspecified: Secondary | ICD-10-CM

## 2022-05-27 MED ORDER — LEVOTHYROXINE SODIUM 125 MCG PO TABS: 125.0000 ug | ORAL_TABLET | Freq: Every day | ORAL | Status: DC

## 2022-05-30 ENCOUNTER — Ambulatory Visit (HOSPITAL_COMMUNITY)
Admission: RE | Admit: 2022-05-30 | Discharge: 2022-05-30 | Disposition: A | Discharge status: Home / Self Care | Payer: Medicare Other | Source: Ambulatory Visit | Attending: Nurse Practitioner | Admitting: Nurse Practitioner

## 2022-05-30 VITALS — BP 170/76 | HR 52 | Ht 72.0 in | Wt 254.0 lb

## 2022-05-30 DIAGNOSIS — I2510 Atherosclerotic heart disease of native coronary artery without angina pectoris: Secondary | ICD-10-CM | POA: Diagnosis not present

## 2022-05-30 DIAGNOSIS — I480 Paroxysmal atrial fibrillation: Secondary | ICD-10-CM

## 2022-05-30 DIAGNOSIS — Z7901 Long term (current) use of anticoagulants: Secondary | ICD-10-CM | POA: Diagnosis not present

## 2022-05-30 DIAGNOSIS — I4819 Other persistent atrial fibrillation: Secondary | ICD-10-CM | POA: Insufficient documentation

## 2022-05-30 DIAGNOSIS — I110 Hypertensive heart disease with heart failure: Secondary | ICD-10-CM | POA: Diagnosis not present

## 2022-05-30 DIAGNOSIS — I509 Heart failure, unspecified: Secondary | ICD-10-CM | POA: Diagnosis not present

## 2022-05-30 MED ORDER — DOXAZOSIN MESYLATE 4 MG PO TABS: ORAL_TABLET | ORAL | Status: DC

## 2022-05-30 NOTE — Progress Notes (Signed)
Primary Care Physician: Tonia Ghent, MD Referring Physician:Dr. Sloan Takagi is a 76 y.o. male with a h/o HTN, CHF, CAD that saw Dr. Quentin Ore  last November as he wanted to come off amiodarone for potential side effects and wanted other options to maintain SR. Dr. Quentin Ore did not feel he was a good ablation candidate and felt Tikosyn was the best   option,  Now after allowing some time for amiodarone washout to permit loading of Tikosyn, he is here today to discuss Tikosyn admit. Amiodarone level done today.   Afib clinic, 11/07/21. Pt is here for Tikosyn admit. He has been off Tikosyn since last November. He is in SR with acceptable qt. No missed anticoagulation.No drugs that are contraindicated with Tikosyn. No Benadryl use. Will be getting drug thru good rx.  F/u 11/17/21, he is in one week s/p tikosyn admit. He is in SR with stable qt. No issues to report.   F/u for Tikosyn surveillance 05/29/22. He noted one episode of afib around a week ago. He went to sleep and it had subsided when he awoke. Otherwise, sinus rhythm. He saw Dr. Saunders Revel on Friday and qtc was acceptable by ekg. Pt deferred another ekg today. He was mildly tachycardic and he was started on metoprolol 25 mg daily and doxazocin was reduced by half as he had a soft BP on that visit. Today, his heart rate is 52 bpm and his BP is 170/76 rechecked at 166/68. We discussed checking his BP/HR for the next week and if BP remains elevated to check with Dr. Saunders Revel to see if he wants to increase the doxazocin back to previous dose.  Labs checked 10/20 showed K+ at 4.2 and magnesium at 2, with a creatinine at 1.31.  Today, he denies symptoms of palpitations, chest pain, shortness of breath, orthopnea, PND, lower extremity edema, dizziness, presyncope, syncope, or neurologic sequela. The patient is tolerating medications without difficulties and is otherwise without complaint today.   Past Medical History:  Diagnosis Date   (HFpEF)  heart failure with preserved ejection fraction ([place])    a. 05/2018 Echo: EF 55-60%, gr2 DD.   Acute lower GI bleeding after colonoscopy and polypectomy, resolved 12/12/2015   Ascending aortic aneurysm ([place])    a. 02/2018 Echo: mildly dil Ao root/asc ao/arch; b. 05/2018 CTA Chest: 4.7cm fusiform aneurysm of Asc Ao.   CAD (coronary artery disease)    a. 05/2018 Cath/PCI: LM nl,LAD 30p, 90/99d/apical, LCX 50m OM1/2/3 min irregs, [place] 85p [place]18 [place].   Cardiac murmur    a. 02/2018 Echo: EF 60-65%, no rwma, mild AI, mildly dil Ao root/Asc Ao/Arch, Mild MR, mildly dil LA. Nl RV fxn.   Cataract    bilateral surgery to remove   CHF (congestive heart failure) (HCC)    Colon polyp    Constipation    Essential hypertension    Hemorrhoids    Hepatitis    self resolved, likely food exposure, 1974.    History of cardiovascular stress test    a. 02/2018 Myoview: EF 55-65%, small/mild apical defect w/ nl wall motion ->attenuation artifact. No ischemia. Low risk study.   Hx of adenomatous colonic polyps 12/05/2015   Hyperlipidemia    Hypothyroidism    Mitral regurgitation    a. 110/2019 Echo: EF 55-60%. Gr2 DD. Triv AI. Ao root 377m Mild MR. Mod dil LA, mildly dil RA.   Osteoarthritis    [place]   a. Dx  02/2018. CHA2DS2VASc = 2-->Eliquis; b. 03/2018 DCCV (200J); c. 03/2018 recurrent Afib-->flecainide started 04/2018;  d. 05/06/2018 s/p successful DCCV - 200J x 2-->on amio.   Skin cancer    basal cell, R ear, MOHS   Past Surgical History:  Procedure Laterality Date   BROW LIFT Bilateral 10/23/2016   Procedure: BLEPHAROPLASTY upper eyelid with excess skin;  Surgeon: Karle Starch, MD;  Location: [place];  Service: Ophthalmology;  Laterality: Bilateral;  MAC   CARDIOVERSION N/A 03/18/2018   Procedure: CARDIOVERSION;  Surgeon: Nelva Bush, MD;  Location: [facility];  Service: Cardiovascular;  Laterality: N/A;   CARDIOVERSION N/A 05/06/2018   Procedure:  CARDIOVERSION;  Surgeon: Nelva Bush, MD;  Location: [facility];  Service: Cardiovascular;  Laterality: N/A;   CATARACT EXTRACTION  1986   OD   CATARACT EXTRACTION Bilateral 1995   x 2 for right and left    COLONOSCOPY     CORONARY STENT INTERVENTION N/A 05/13/2018   Procedure: CORONARY STENT INTERVENTION;  Surgeon: Nelva Bush, MD;  Location: [facility];  Service: Cardiovascular;  Laterality: N/A;   ECTROPION REPAIR Bilateral 10/23/2016   Procedure: REPAIR OF ECTROPION sutures, extensive;  Surgeon: Karle Starch, MD;  Location: [facility];  Service: Ophthalmology;  Laterality: Bilateral;   LIPOMA EXCISION  06/1997   left neck (Juengel)   MOHS SURGERY Right 2013   behind right ear   RIGHT/LEFT HEART CATH AND CORONARY ANGIOGRAPHY N/A 05/12/2018   Procedure: RIGHT/LEFT HEART CATH AND CORONARY ANGIOGRAPHY;  Surgeon: Wellington Hampshire, MD;  Location: [place] [facility];  Service: Cardiovascular;  Laterality: N/A;   TONSILLECTOMY     VASECTOMY  1982    Current Outpatient Medications  Medication Sig Dispense Refill   atorvastatin (LIPITOR) 80 MG tablet TAKE 1 TABLET BY MOUTH DAILY AT 6 PM 90 tablet 0   diclofenac Sodium (VOLTAREN) 1 % GEL Apply 2 g topically 4 (four) times daily as needed.     dofetilide (TIKOSYN) 500 MCG capsule Take 1 capsule (500 mcg total) by mouth 2 (two) times daily. 180 capsule 2   ELIQUIS 5 MG TABS tablet TAKE 1 TABLET BY MOUTH TWICE DAILY 180 tablet 1   ezetimibe (ZETIA) 10 MG tablet TAKE 1 TABLET BY MOUTH DAILY 90 tablet 0   finasteride (PROSCAR) 5 MG tablet TAKE 1 TABLET BY MOUTH EVERY DAY 90 tablet 3   furosemide (LASIX) 20 MG tablet Take 1 tablet (20 mg total) by mouth every other day 45 tablet 1   ibuprofen (ADVIL) 200 MG tablet Take 400 mg by mouth every 6 (six) hours as needed for moderate pain.     levothyroxine (SYNTHROID) 125 MCG tablet Take 1 tablet (125 mcg total) by mouth daily before breakfast. Except take half tab on Sundays.   6.5 tabs per week.     metoprolol succinate (TOPROL XL) 25 MG 24 hr tablet Take 1 tablet (25 mg total) by mouth daily. 90 tablet 3   Multiple Vitamin (MULTIVITAMIN WITH MINERALS) TABS tablet Take 1 tablet by mouth daily. Senior Multivitamin     psyllium (METAMUCIL) 58.6 % packet Take 1 packet by mouth daily.     ramipril (ALTACE) 10 MG capsule TAKE ONE CAPSULE BY MOUTH DAILY GENERIC EQUIVALENT FOR ALTACE 90 capsule 3   White Petrolatum-Mineral Oil (REFRESH P.M. OP) Place 1 drop into both eyes daily as needed (dry eyes).     doxazosin (CARDURA) 4 MG tablet Taking 1/2 tablet by mouth in the am  No current facility-administered medications for this encounter.    Allergies  Allergen Reactions   Sulfonamide Derivatives Rash    childhood    Social History   Socioeconomic History   Marital status: Married    Spouse name: Remo Lipps   Number of children: 1   Years of education: 14   Highest education level: Associate degree: occupational, Hotel manager, or vocational program  Occupational History   Occupation: Retired    Fish farm manager: RETIRED    Comment: 1  Tobacco Use   Smoking status: Former    Packs/day: 2.00    Years: 20.00    Total pack years: 40.00    Types: Cigarettes    Quit date: 08/07/1983    Years since quitting: 38.8    Passive exposure: Past   Smokeless tobacco: Never   Tobacco comments:    Former smoker 11/17/21 quit over 40 years ago  Vaping Use   Vaping Use: Never used  Substance and Sexual Activity   Alcohol use: Yes    Alcohol/week: 7.0 standard drinks of alcohol    Types: 7 Shots of liquor per week    Comment: 1 at night before bed 09/28/21   Drug use: No   Sexual activity: Yes    Partners: Female  Other Topics Concern   Not on file  Social History Narrative   Married and lives with wife 1974   One daughter, not local   Retired from [employer], sea based telecommunication/contracting   Social Determinants of Health   Financial Resource Strain: [place]  (08/17/2021)    Overall Financial Resource Strain (CARDIA)    Difficulty of Paying Living Expenses: Not hard at all  Food Insecurity: No Food Insecurity (08/17/2021)   Hunger Vital Sign    Worried About Running Out of Food in the Last Year: Never true    [place] in the Last Year: Never true  Transportation Needs: No Transportation Needs (08/17/2021)   PRAPARE - Hydrologist (Medical): No    Lack of Transportation (Non-Medical): No  Physical Activity: Insufficiently Active (08/17/2021)   Exercise Vital Sign    Days of Exercise per Week: 7 days    Minutes of Exercise per Session: 10 min  Stress: No Stress Concern Present (08/17/2021)   [place]    Feeling of Stress : Not at all  Social Connections: Moderately Integrated (08/17/2021)   Social Connection and Isolation Panel [NHANES]    Frequency of Communication with Friends and Family: Three times a week    Frequency of Social Gatherings with Friends and Family: Once a week    Attends Religious Services: Never    Marine scientist or Organizations: Yes    Attends Archivist Meetings: Never    Marital Status: Married  Human resources officer Violence: Not At Risk (08/17/2021)   Humiliation, Afraid, Rape, and Kick questionnaire    Fear of Current or Ex-Partner: No    Emotionally Abused: No    Physically Abused: No    Sexually Abused: No    Family History  Problem Relation Age of Onset   Stroke Mother    Lung cancer Maternal Grandfather        smoker   Stroke Maternal Grandfather    Heart disease Paternal Grandfather        MI, old age   Colon cancer Neg Hx    Colon polyps Neg Hx    Esophageal cancer  Neg Hx    Rectal cancer Neg Hx    Stomach cancer Neg Hx    Bladder Cancer Neg Hx    Prostate cancer Neg Hx     ROS- All systems are reviewed and negative except as per the HPI above  Physical Exam: Vitals:   05/30/22 0825  BP: (!)  170/76  Pulse: (!) 52  Weight: 115.2 kg  Height: 6' (1.829 m)   Wt Readings from Last 3 Encounters:  05/30/22 115.2 kg  05/25/22 116.6 kg  03/21/22 116.7 kg    Labs: Lab Results  Component Value Date   NA 142 05/25/2022   K 4.2 05/25/2022   CL 110 05/25/2022   CO2 25 05/25/2022   GLUCOSE 113 (H) 05/25/2022   BUN 23 05/25/2022   CREATININE 1.31 (H) 05/25/2022   CALCIUM 9.4 05/25/2022   MG 2.0 05/25/2022   Lab Results  Component Value Date   INR 1.31 05/09/2018   Lab Results  Component Value Date   CHOL 121 05/25/2022   HDL 41 05/25/2022   LDLCALC 66 05/25/2022   TRIG 70 05/25/2022     GEN- The patient is well appearing, alert and oriented x 3 today.   Head- normocephalic, atraumatic Eyes-  Sclera clear, conjunctiva pink Ears- hearing intact Oropharynx- clear Neck- supple, no JVP Lymph- no cervical lymphadenopathy Lungs- Clear to ausculation bilaterally, normal work of breathing Heart- regular rate and rhythm, no murmurs, rubs or gallops, PMI not laterally displaced GI- soft, NT, ND, + BS Extremities- no clubbing, cyanosis, or edema MS- no significant deformity or atrophy Skin- no rash or lesion Psych- euthymic mood, full affect Neuro- strength and sensation are intact   EKG-  see EKG from 10/20 with sinus tach at 102 with a qtc of 474 ms    Assessment and Plan:  1. Afib Off amiodarone from last November   Pt wanted  to come off amiodarone for fear of potential side effects  He is not optimal ablation candidate so decided to go ahead with  Tikosyn admit 11/10/21 He has been doing well staying in SR. Qtc is acceptable at 474 ms Bmet/mag drawn on 10/20 with acceptable results to continue with Tikosyn Continue dofetilide 500 mcg bid and metoprolol succinate  25 mg daily  Tachycardia form visit 10/20 has resolved   2. CHA2DS2VASc  score of 4 Continue eliquis 5 mg bid  3. HTN Follow at home for the next week  and if remains elevated above 562 systolic  then see if Dr. Saunders Revel wants to go back to 4 mg doxazosin   F/u with Dr. Saunders Revel 08/24/22 and afib clinic April of 2024   Geroge Baseman. Reinhold Rickey, [facility] [address] [place], [place] [address] [phone]

## 2022-06-05 ENCOUNTER — Encounter: Payer: Self-pay | Admitting: Internal Medicine

## 2022-06-11 ENCOUNTER — Other Ambulatory Visit: Payer: Self-pay | Admitting: Family Medicine

## 2022-06-11 DIAGNOSIS — I4891 Unspecified atrial fibrillation: Secondary | ICD-10-CM

## 2022-06-11 NOTE — Telephone Encounter (Signed)
Patient has been scheduled

## 2022-06-11 NOTE — Telephone Encounter (Signed)
Please call and schedule AWV with Dr. Damita Dunnings for jan 2024

## 2022-06-22 ENCOUNTER — Other Ambulatory Visit (INDEPENDENT_AMBULATORY_CARE_PROVIDER_SITE_OTHER): Payer: Medicare Other

## 2022-06-22 DIAGNOSIS — E039 Hypothyroidism, unspecified: Secondary | ICD-10-CM

## 2022-06-22 LAB — TSH: TSH: 0.16 u[IU]/mL — ABNORMAL LOW (ref 0.35–5.50)

## 2022-06-27 ENCOUNTER — Other Ambulatory Visit: Payer: Self-pay | Admitting: Family Medicine

## 2022-06-27 ENCOUNTER — Encounter: Payer: Self-pay | Admitting: Family Medicine

## 2022-06-27 DIAGNOSIS — E039 Hypothyroidism, unspecified: Secondary | ICD-10-CM

## 2022-06-27 MED ORDER — LEVOTHYROXINE SODIUM 125 MCG PO TABS: 125.0000 ug | ORAL_TABLET | Freq: Every day | ORAL | Status: DC

## 2022-07-10 ENCOUNTER — Other Ambulatory Visit: Payer: Self-pay | Admitting: Family Medicine

## 2022-07-10 ENCOUNTER — Other Ambulatory Visit: Payer: Self-pay | Admitting: Internal Medicine

## 2022-08-02 ENCOUNTER — Other Ambulatory Visit: Payer: Medicare Other

## 2022-08-08 ENCOUNTER — Other Ambulatory Visit: Payer: Self-pay | Admitting: Family Medicine

## 2022-08-08 DIAGNOSIS — E039 Hypothyroidism, unspecified: Secondary | ICD-10-CM

## 2022-08-08 DIAGNOSIS — I10 Essential (primary) hypertension: Secondary | ICD-10-CM

## 2022-08-15 ENCOUNTER — Other Ambulatory Visit (HOSPITAL_COMMUNITY): Payer: Self-pay | Admitting: *Deleted

## 2022-08-15 MED ORDER — DOFETILIDE 500 MCG PO CAPS: 500.0000 ug | ORAL_CAPSULE | Freq: Two times a day (BID) | ORAL | 2 refills | Status: DC

## 2022-08-20 ENCOUNTER — Ambulatory Visit (INDEPENDENT_AMBULATORY_CARE_PROVIDER_SITE_OTHER): Payer: Medicare Other

## 2022-08-20 VITALS — HR 67 | Ht 72.0 in | Wt 254.0 lb

## 2022-08-20 DIAGNOSIS — Z0000 Encounter for general adult medical examination without abnormal findings: Secondary | ICD-10-CM | POA: Diagnosis not present

## 2022-08-20 NOTE — Patient Instructions (Addendum)
James [name] , Thank you for taking time to come for your Medicare Wellness Visit. I appreciate your ongoing commitment to your health goals. Please review the following plan we discussed and let me know if I can assist you in the future.   These are the goals we discussed:  Goals Addressed             This Visit's Progress    Increase physical activity       Increase walking for exercise as tolerated         This is a list of the screening recommended for you and due dates:  Health Maintenance  Topic Date Due   DTaP/Tdap/Td vaccine (2 - Tdap) 02/18/2018   COVID-19 Vaccine (5 - 2023-24 season) 09/05/2022*   Colon Cancer Screening  10/19/2022   Medicare Annual Wellness Visit  08/21/2023   Pneumonia Vaccine  Completed   Flu Shot  Completed   Hepatitis C Screening: USPSTF Recommendation to screen - Ages 18-79 yo.  Completed   Zoster (Shingles) Vaccine  Completed   HPV Vaccine  Aged Out  *Topic was postponed. The date shown is not the original due date.    Advanced directives: on file  Conditions/risks identified: none new  Next appointment: Follow up in one year for your annual wellness visit.   Preventive Care 76 Years and Older, Male  Preventive care refers to lifestyle choices and visits with your health care provider that can promote health and wellness. What does preventive care include? A yearly physical exam. This is also called an annual well check. Dental exams once or twice a year. Routine eye exams. Ask your health care provider how often you should have your eyes checked. Personal lifestyle choices, including: Daily care of your teeth and gums. Regular physical activity. Eating a healthy diet. Avoiding tobacco and drug use. Limiting alcohol use. Practicing safe sex. Taking low doses of aspirin every day. Taking vitamin and mineral supplements as recommended by your health care provider. What happens during an annual well check? The services and screenings  done by your health care provider during your annual well check will depend on your age, overall health, lifestyle risk factors, and family history of disease. Counseling  Your health care provider may ask you questions about your: Alcohol use. Tobacco use. Drug use. Emotional well-being. Home and relationship well-being. Sexual activity. Eating habits. History of falls. Memory and ability to understand (cognition). Work and work Statistician. Screening  You may have the following tests or measurements: Height, weight, and BMI. Blood pressure. Lipid and cholesterol levels. These may be checked every 5 years, or more frequently if you are over 37 years old. Skin check. Lung cancer screening. You may have this screening every year starting at age 43 if you have a 30-pack-year history of smoking and currently smoke or have quit within the past 15 years. Fecal occult blood test (FOBT) of the stool. You may have this test every year starting at age 61. Flexible sigmoidoscopy or colonoscopy. You may have a sigmoidoscopy every 5 years or a colonoscopy every 10 years starting at age 50. Prostate cancer screening. Recommendations will vary depending on your family history and other risks. Hepatitis C blood test. Hepatitis B blood test. Sexually transmitted disease (STD) testing. Diabetes screening. This is done by checking your blood sugar (glucose) after you have not eaten for a while (fasting). You may have this done every 1-3 years. Abdominal aortic aneurysm (AAA) screening. You may need this if  you are a current or former smoker. Osteoporosis. You may be screened starting at age 49 if you are at high risk. Talk with your health care provider about your test results, treatment options, and if necessary, the need for more tests. Vaccines  Your health care provider may recommend certain vaccines, such as: Influenza vaccine. This is recommended every year. Tetanus, diphtheria, and acellular  pertussis (Tdap, Td) vaccine. You may need a Td booster every 10 years. Zoster vaccine. You may need this after age 37. Pneumococcal 13-valent conjugate (PCV13) vaccine. One dose is recommended after age 68. Pneumococcal polysaccharide (PPSV23) vaccine. One dose is recommended after age 31. Talk to your health care provider about which screenings and vaccines you need and how often you need them. This information is not intended to replace advice given to you by your health care provider. Make sure you discuss any questions you have with your health care provider. Document Released: 08/19/2015 Document Revised: 04/11/2016 Document Reviewed: 05/24/2015 Elsevier Interactive Patient Education  2017 [place] Prevention in the Home Falls can cause injuries. They can happen to people of all ages. There are many things you can do to make your home safe and to help prevent falls. What can I do on the outside of my home? Regularly fix the edges of walkways and driveways and fix any cracks. Remove anything that might make you trip as you walk through a door, such as a raised step or threshold. Trim any bushes or trees on the path to your home. Use bright outdoor lighting. Clear any walking paths of anything that might make someone trip, such as rocks or tools. Regularly check to see if handrails are loose or broken. Make sure that both sides of any steps have handrails. Any raised decks and porches should have guardrails on the edges. Have any leaves, snow, or ice cleared regularly. Use sand or salt on walking paths during winter. Clean up any spills in your garage right away. This includes oil or grease spills. What can I do in the bathroom? Use night lights. Install grab bars by the toilet and in the tub and shower. Do not use towel bars as grab bars. Use non-skid mats or decals in the tub or shower. If you need to sit down in the shower, use a plastic, non-slip stool. Keep the floor  dry. Clean up any water that spills on the floor as soon as it happens. Remove soap buildup in the tub or shower regularly. Attach bath mats securely with double-sided non-slip rug tape. Do not have throw rugs and other things on the floor that can make you trip. What can I do in the bedroom? Use night lights. Make sure that you have a light by your bed that is easy to reach. Do not use any sheets or blankets that are too big for your bed. They should not hang down onto the floor. Have a firm chair that has side arms. You can use this for support while you get dressed. Do not have throw rugs and other things on the floor that can make you trip. What can I do in the kitchen? Clean up any spills right away. Avoid walking on wet floors. Keep items that you use a lot in easy-to-reach places. If you need to reach something above you, use a strong step stool that has a grab bar. Keep electrical cords out of the way. Do not use floor polish or wax that makes floors slippery. If  you must use wax, use non-skid floor wax. Do not have throw rugs and other things on the floor that can make you trip. What can I do with my stairs? Do not leave any items on the stairs. Make sure that there are handrails on both sides of the stairs and use them. Fix handrails that are broken or loose. Make sure that handrails are as long as the stairways. Check any carpeting to make sure that it is firmly attached to the stairs. Fix any carpet that is loose or worn. Avoid having throw rugs at the top or bottom of the stairs. If you do have throw rugs, attach them to the floor with carpet tape. Make sure that you have a light switch at the top of the stairs and the bottom of the stairs. If you do not have them, ask someone to add them for you. What else can I do to help prevent falls? Wear shoes that: Do not have high heels. Have rubber bottoms. Are comfortable and fit you well. Are closed at the toe. Do not wear  sandals. If you use a stepladder: Make sure that it is fully opened. Do not climb a closed stepladder. Make sure that both sides of the stepladder are locked into place. Ask someone to hold it for you, if possible. Clearly mark and make sure that you can see: Any grab bars or handrails. First and last steps. Where the edge of each step is. Use tools that help you move around (mobility aids) if they are needed. These include: Canes. Walkers. Scooters. Crutches. Turn on the lights when you go into a dark area. Replace any light bulbs as soon as they burn out. Set up your furniture so you have a clear path. Avoid moving your furniture around. If any of your floors are uneven, fix them. If there are any pets around you, be aware of where they are. Review your medicines with your doctor. Some medicines can make you feel dizzy. This can increase your chance of falling. Ask your doctor what other things that you can do to help prevent falls. This information is not intended to replace advice given to you by your health care provider. Make sure you discuss any questions you have with your health care provider. Document Released: 05/19/2009 Document Revised: 12/29/2015 Document Reviewed: 08/27/2014 Elsevier Interactive Patient Education  2017 [employer].

## 2022-08-20 NOTE — Progress Notes (Signed)
Subjective:   James [name] is a 77 y.o. male who presents for Medicare Annual/Subsequent preventive examination.  Review of Systems    No ROS.  Medicare Wellness Virtual Visit.  Visual/audio telehealth visit, UTA vital signs.   See social history for additional risk factors.   Cardiac Risk Factors include: advanced age (>31mn, >>53women);male gender     Objective:    Today's Vitals   08/20/22 0741  Pulse: 67  Weight: 254 lb (115.2 kg)  Height: 6' (1.829 m)   Body mass index is 34.45 kg/m.     08/20/2022    8:09 AM 11/08/2021    3:30 PM 11/07/2021    2:10 PM 10/02/2021    9:53 AM 08/17/2021    8:19 AM 08/16/2020    8:16 AM 08/11/2019    3:38 PM  Advanced Directives  Does Patient Have a Medical Advance Directive? _0  Yes Yes  Type of AParamedicof AMarlboro VillageLiving will  Healthcare Power of A[place]ardLiving will HM[place]Living will H[place]Living will H[place]Living will  Does patient want to make changes to medical advance directive? No - Patient declined  No - Patient declined  Yes (MAU/Ambulatory/Procedural Areas - Information given)    Copy of [place]ake [place] Chart? Yes - validated most recent copy scanned in chart (See row information)   Yes - validated most recent copy scanned in chart (See row information) Yes - validated most recent copy scanned in chart (See row information) Yes - validated most recent copy scanned in chart (See row information) Yes - validated most recent copy scanned in chart (See row information)    Current Medications (verified) Outpatient Encounter Medications as of 08/20/2022  Medication Sig   atorvastatin (LIPITOR) 80 MG tablet TAKE 1 TABLET BY MOUTH DAILY AT 6PM   diclofenac Sodium (VOLTAREN) 1 % GEL Apply 2 g topically 4 (four) times daily as needed.   dofetilide (TIKOSYN) 500 MCG capsule Take 1 capsule  (500 mcg total) by mouth 2 (two) times daily.   doxazosin (CARDURA) 4 MG tablet Taking 1/2 tablet by mouth in the am   ELIQUIS 5 MG TABS tablet TAKE 1 TABLET BY MOUTH TWICE DAILY   ezetimibe (ZETIA) 10 MG tablet TAKE 1 TABLET BY MOUTH DAILY   finasteride (PROSCAR) 5 MG tablet TAKE 1 TABLET BY MOUTH EVERY DAY   furosemide (LASIX) 20 MG tablet TAKE 1 TABLET BY MOUTH EVERY OTHER DAY GENERIC EQUIVALENT FOR LASIX   ibuprofen (ADVIL) 200 MG tablet Take 400 mg by mouth every 6 (six) hours as needed for moderate pain.   levothyroxine (SYNTHROID) 125 MCG tablet Take 1 tablet (125 mcg total) by mouth daily before breakfast. Except skip dose on Sundays.  6 tabs per week.   metoprolol succinate (TOPROL XL) 25 MG 24 hr tablet Take 1 tablet (25 mg total) by mouth daily.   Multiple Vitamin (MULTIVITAMIN WITH MINERALS) TABS tablet Take 1 tablet by mouth daily. Senior Multivitamin   psyllium (METAMUCIL) 58.6 % packet Take 1 packet by mouth daily.   ramipril (ALTACE) 10 MG capsule TAKE ONE CAPSULE BY MOUTH DAILY GENERIC EQUIVALENT FOR ALTACE   White Petrolatum-Mineral Oil (REFRESH P.M. OP) Place 1 drop into both eyes daily as needed (dry eyes).   No facility-administered encounter medications on file as of 08/20/2022.    Allergies (verified) Sulfonamide derivatives   History: Past Medical History:  Diagnosis  Date   (HFpEF) heart failure with preserved ejection fraction ([place])    a. 05/2018 Echo: EF 55-60%, gr2 DD.   Acute lower GI bleeding after colonoscopy and polypectomy, resolved 12/12/2015   Ascending aortic aneurysm ([place])    a. 02/2018 Echo: mildly dil Ao root/asc ao/arch; b. 05/2018 CTA Chest: 4.7cm fusiform aneurysm of Asc Ao.   CAD (coronary artery disease)    a. 05/2018 Cath/PCI: LM nl,LAD 30p, 90/99d/apical, LCX 74m OM1/2/3 min irregs, RCA 85p (3.5x18 [place].   Cardiac murmur    a. 02/2018 Echo: EF 60-65%, no rwma, mild AI, mildly dil Ao root/Asc Ao/Arch, Mild MR, mildly dil LA. Nl RV fxn.    Cataract    bilateral surgery to remove   CHF (congestive heart failure) (HCC)    Colon polyp    Constipation    Essential hypertension    Hemorrhoids    Hepatitis    self resolved, likely food exposure, 1974.    History of cardiovascular stress test    a. 02/2018 Myoview: EF 55-65%, small/mild apical defect w/ nl wall motion ->attenuation artifact. No ischemia. Low risk study.   Hx of adenomatous colonic polyps 12/05/2015   Hyperlipidemia    Hypothyroidism    Mitral regurgitation    a. 110/2019 Echo: EF 55-60%. Gr2 DD. Triv AI. Ao root 377m Mild MR. Mod dil LA, mildly dil RA.   Osteoarthritis    Persistent atrial fibrillation ([place]   a. Dx 02/2018. CHA2DS2VASc = 2-->Eliquis; b. 03/2018 DCCV (200J); c. 03/2018 recurrent Afib-->flecainide started 04/2018;  d. 05/06/2018 s/p successful DCCV - 200J x 2-->on amio.   Skin cancer    basal cell, R ear, MOHS   Past Surgical History:  Procedure Laterality Date   BROW LIFT Bilateral 10/23/2016   Procedure: BLEPHAROPLASTY upper eyelid with excess skin;  Surgeon: AmKarle StarchMD;  [place]: [facility] Service: Ophthalmology;  Laterality: Bilateral;  MAC   CARDIOVERSION N/A 03/18/2018   Procedure: CARDIOVERSION;  Surgeon: EnNelva BushMD;  [facility];  Service: Cardiovascular;  Laterality: N/A;   CARDIOVERSION N/A 05/06/2018   Procedure: CARDIOVERSION;  Surgeon: EnNelva BushMD;  [facility];  Service: Cardiovascular;  Laterality: N/A;   CATARACT EXTRACTION  1986   OD   CATARACT EXTRACTION Bilateral 1995   x 2 for right and left    COLONOSCOPY     CORONARY STENT INTERVENTION N/A 05/13/2018   Procedure: CORONARY STENT INTERVENTION;  Surgeon: EnNelva BushMD;  [facility];  Service: Cardiovascular;  Laterality: N/A;   ECTROPION REPAIR Bilateral 10/23/2016   Procedure: REPAIR OF ECTROPION sutures, extensive;  Surgeon: AmKarle StarchMD;  [place] [facility] [place] Service:  Ophthalmology;  Laterality: Bilateral;   LIPOMA EXCISION  06/1997   left neck ([place])   MOHS SURGERY Right 2013   behind right ear   RIGHT/LEFT HEART CATH AND CORONARY ANGIOGRAPHY N/A 05/12/2018   Procedure: RIGHT/LEFT HEART CATH AND CORONARY ANGIOGRAPHY;  Surgeon: ArWellington HampshireMD;  [facility];  Service: Cardiovascular;  Laterality: N/A;   TONSILLECTOMY     VASECTOMY  1982   Family History  Problem Relation Age of Onset   Stroke Mother    Lung cancer Maternal Grandfather        smoker   Stroke Maternal Grandfather    Heart disease Paternal Grandfather        MI, old age   Colon cancer Neg Hx  Colon polyps Neg Hx    Esophageal cancer Neg Hx    Rectal cancer Neg Hx    Stomach cancer Neg Hx    Bladder Cancer Neg Hx    Prostate cancer Neg Hx    Social History   Socioeconomic History   Marital status: Married    Spouse name: Remo Lipps   Number of children: 1   Years of education: 14   Highest education level: Associate degree: occupational, Hotel manager, or vocational program  Occupational History   Occupation: Retired    Fish farm manager: RETIRED    Comment: 1  Tobacco Use   Smoking status: Former    Packs/day: 2.00    Years: 20.00    Total pack years: 40.00    Types: Cigarettes    Quit date: 08/07/1983    Years since quitting: 39.0    Passive exposure: Past   Smokeless tobacco: Never   Tobacco comments:    Former smoker 11/17/21 quit over 40 years ago  Vaping Use   Vaping Use: Never used  Substance and Sexual Activity   Alcohol use: Yes    Alcohol/week: 7.0 standard drinks of alcohol    Types: 7 Shots of liquor per week    Comment: 1 at night before bed 09/28/21   Drug use: No   Sexual activity: Yes    Partners: Female  Other Topics Concern   Not on file  Social History Narrative   Married and lives with wife 1974   One daughter, not local   Retired from [employer], sea based telecommunication/contracting   Social Determinants of Health    Financial Resource Strain: Gem  (08/20/2022)   Overall Financial Resource Strain (CARDIA)    Difficulty of Paying Living Expenses: Not hard at all  Food Insecurity: No Food Insecurity (08/20/2022)   Hunger Vital Sign    Worried About Running Out of Food in the Last Year: Never true    [place] in the Last Year: Never true  Transportation Needs: No Transportation Needs (08/20/2022)   PRAPARE - Hydrologist (Medical): No    Lack of Transportation (Non-Medical): No  Physical Activity: Insufficiently Active (08/20/2022)   Exercise Vital Sign    Days of Exercise per Week: 5 days    Minutes of Exercise per Session: 20 min  Stress: No Stress Concern Present (08/20/2022)   [place]    Feeling of Stress : Not at all  Social Connections: Unknown (08/20/2022)   Social Connection and Isolation Panel [NHANES]    Frequency of Communication with Friends and Family: Once a week    Frequency of Social Gatherings with Friends and Family: Once a week    Attends Religious Services: Not on Diplomatic Services operational officer of Clubs or Organizations: Yes    Attends Archivist Meetings: 1 to 4 times per year    Marital Status: Widowed    Tobacco Counseling Counseling given: Not Answered Tobacco comments: Former smoker 11/17/21 quit over 40 years ago   Clinical Intake:  Pre-visit preparation completed: Yes        Diabetes: No  How often do you need to have someone help you when you read instructions, pamphlets, or other written materials from your doctor or pharmacy?: 1 - Never   Interpreter Needed?: No      Activities of Daily Living    08/20/2022    7:42 AM 08/18/2022  8:40 AM  In your present state of health, do you have any difficulty performing the following activities:  Hearing? 0 0  Vision? 0 0  Difficulty concentrating or making decisions? 1 1  Comment Takes notes when  multi-tasking with cognitive work. Age appropriate. Declines further follow up at this time.   Walking or climbing stairs? 1 1  Comment Paces self. Chronic L knee pain. Followed by Ortho.   Dressing or bathing? 0 0  Doing errands, shopping? 0 0  Preparing Food and eating ? N N  Using the Toilet? N N  In the past six months, have you accidently leaked urine? N N  Do you have problems with loss of bowel control? N N  Managing your Medications? N N  Managing your Finances? N N  Housekeeping or managing your Housekeeping? N N    Patient Care Team: Tonia Ghent, MD as PCP - General (Family Medicine) End, Harrell Gave, MD as PCP - Cardiology (Cardiology) Vickie Epley, MD as PCP - Electrophysiology (Cardiology)  Indicate any recent Medical Services you may have received from other than [facility] providers in the past year (date may be approximate).     Assessment:   This is a routine wellness examination for [facility].  I connected with  [name] on 08/20/22 by a audio enabled telemedicine application and verified that I am speaking with the correct person using two identifiers.  Patient Location: Home  Provider Location: Office/Clinic  I discussed the limitations of evaluation and management by telemedicine. The patient expressed understanding and agreed to proceed.   Hearing/Vision screen Hearing Screening - Comments:: Patient is able to hear conversational tones without difficulty.  No issues reported.   Vision Screening - Comments:: Follow[facility] Wears corrective lenses Cataract extraction, bilateral They have seen their ophthalmologist in the last 12 months.    Dietary issues and exercise activities discussed: Current Exercise Habits: Home exercise routine, Type of exercise: walking, Time (Minutes): 30, Frequency (Times/Week): 5, Weekly Exercise (Minutes/Week): 150, Intensity: Mild   Goals Addressed             This Visit's Progress     Increase physical activity       Increase walking for exercise as tolerated       Depression Screen    08/20/2022    7:44 AM 08/17/2021    8:22 AM 08/16/2020    8:17 AM 08/11/2019    3:39 PM 08/08/2018    7:59 AM 05/22/2018   11:53 AM 07/25/2017   10:18 AM  PHQ 2/9 Scores  PHQ - 2 Score 0 0 0 0 0 0 0  PHQ- 9 Score   0 0 0  0    Fall Risk    08/20/2022    7:45 AM 08/18/2022    8:37 AM 08/17/2021    8:21 AM 08/16/2020    8:16 AM 08/11/2019    3:39 PM  [place] in the past year? 0 0 0 0 0  Number falls in past yr: 0 0 0 0 0  Injury with Fall? 0 0 0 0 0  Risk for fall due to : No Fall Risks  No Fall Risks Medication side effect Medication side effect  Follow up Falls evaluation completed  Falls prevention discussed Falls evaluation completed;Falls prevention discussed Falls evaluation completed;Falls prevention discussed    FALL RISK PREVENTION PERTAINING TO THE HOME: Home free of loose throw rugs in walkways, pet beds, electrical  cords, etc? Yes  Adequate lighting in your home to reduce risk of falls? Yes   ASSISTIVE DEVICES UTILIZED TO PREVENT FALLS: Life alert? No  Use of a cane, walker or w/c? No   TIMED UP AND GO: Was the test performed? No .   Cognitive Function:    08/16/2020    8:19 AM 08/11/2019    3:41 PM 08/08/2018    7:58 AM 07/25/2017   10:18 AM  MMSE - Mini Mental State Exam  Orientation to time _0 Orientation to Place _1 Registration _2 Attention/ Calculation 5 5 0 0  Recall _3 Language- name 2 objects   0 0  Language- repeat _4 Language- follow 3 step command   3 3  Language- read & follow direction   0 0  Write a sentence   0 0  Copy design   0 0  Total score   20 20        08/20/2022    7:46 AM  6CIT Screen  What Year? 0 points  What month? 0 points  What time? 0 points  Count back from 20 0 points  Months in reverse 0 points  Repeat phrase 0 points  Total Score 0 points    Immunizations Immunization  History  Administered Date(s) Administered   Fluad Quad(high Dose 65+) 05/07/2019, 04/20/2022   Influenza, High Dose Seasonal PF 04/14/2017   Influenza,inj,Quad PF,6+ Mos 04/16/2018   Influenza-Unspecified 05/06/2013, 04/20/2014, 05/07/2015, 04/06/2016, 04/14/2017, 04/16/2018, 05/06/2020, 04/24/2021   PFIZER(Purple Top)SARS-COV-2 Vaccination 08/29/2019, 09/19/2019, 05/05/2020   Pfizer Covid-19 Vaccine Bivalent Booster 29yr & up 04/24/2021   Pneumococcal Conjugate-13 07/20/2014   Pneumococcal Polysaccharide-23 07/16/2013   Td 02/19/2008   Zoster Recombinat (Shingrix) 06/11/2019, 08/17/2019   Zoster, Live 09/30/2007   TDAP status: Due, Education has been provided regarding the importance of this vaccine. Advised may receive this vaccine at local pharmacy or Health Dept. Aware to provide a copy of the vaccination record if obtained from local pharmacy or Health Dept. Verbalized acceptance and understanding.  Screening Tests Health Maintenance  Topic Date Due   DTaP/Tdap/Td (2 - Tdap) 02/18/2018   COVID-19 Vaccine (5 - 2023-24 season) 09/05/2022 (Originally 04/06/2022)   COLONOSCOPY (Pts [phone] coverage will need to be confirmed)  10/19/2022   Medicare Annual Wellness (AWV)  08/21/2023   Pneumonia Vaccine 6561Years old  Completed   INFLUENZA VACCINE  Completed   Hepatitis C Screening  Completed   Zoster Vaccines- Shingrix  Completed   HPV VACCINES  Aged Out   Health Maintenance Health Maintenance Due  Topic Date Due   DTaP/Tdap/Td (2 - Tdap) 02/18/2018   Lung Cancer Screening: (Low Dose CT Chest recommended if Age 76-80ears, 30 pack-year currently smoking OR have quit w/in 15years.) does not qualify.   Hepatitis C Screening: Completed 10/2015.  Vision Screening: Recommended annual ophthalmology exams for early detection of glaucoma and other disorders of the eye.  Dental Screening: Recommended annual dental exams for proper oral hygiene. Visits every 6 months.    Community Resource Referral / Chronic Care Management: CRR required this visit?  No   CCM required this visit?  No      Plan:     I have personally reviewed and noted the following in the patient's chart:   Medical and social history Use of alcohol, tobacco or illicit drugs  Current medications and supplements  including opioid prescriptions. Patient is not currently taking opioid prescriptions. Functional ability and status Nutritional status Physical activity Advanced directives List of other physicians Hospitalizations, surgeries, and ER visits in previous 12 months Vitals Screenings to include cognitive, depression, and falls Referrals and appointments  In addition, I have reviewed and discussed with patient certain preventive protocols, quality metrics, and best practice recommendations. A written personalized care plan for preventive services as well as general preventive health recommendations were provided to patient.     Leta Jungling, LPN   5/83/0940

## 2022-08-21 DIAGNOSIS — K080 Exfoliation of teeth due to systemic causes: Secondary | ICD-10-CM | POA: Diagnosis not present

## 2022-08-23 ENCOUNTER — Other Ambulatory Visit (INDEPENDENT_AMBULATORY_CARE_PROVIDER_SITE_OTHER): Payer: Medicare Other

## 2022-08-23 DIAGNOSIS — E039 Hypothyroidism, unspecified: Secondary | ICD-10-CM

## 2022-08-23 DIAGNOSIS — I10 Essential (primary) hypertension: Secondary | ICD-10-CM

## 2022-08-23 LAB — COMPREHENSIVE METABOLIC PANEL
ALT: 9 U/L (ref 0–53)
AST: 14 U/L (ref 0–37)
Albumin: 3.9 g/dL (ref 3.5–5.2)
Alkaline Phosphatase: 65 U/L (ref 39–117)
BUN: 21 mg/dL (ref 6–23)
CO2: 28 mEq/L (ref 19–32)
Calcium: 9.3 mg/dL (ref 8.4–10.5)
Chloride: 105 mEq/L (ref 96–112)
Creatinine, Ser: 1.32 mg/dL (ref 0.40–1.50)
GFR: 52.92 mL/min — ABNORMAL LOW (ref >60.00)
Glucose, Bld: 86 mg/dL (ref 70–99)
Potassium: 4.8 mEq/L (ref 3.5–5.1)
Sodium: 140 mEq/L (ref 135–145)
Total Bilirubin: 0.9 mg/dL (ref 0.2–1.2)
Total Protein: 6.2 g/dL (ref 6.0–8.3)

## 2022-08-23 LAB — CBC WITH DIFFERENTIAL/PLATELET
Basophils Absolute: 0 10*3/uL (ref 0.0–0.1)
Basophils Relative: 0.4 % (ref 0.0–3.0)
Eosinophils Absolute: 0.2 10*3/uL (ref 0.0–0.7)
Eosinophils Relative: 2 % (ref 0.0–5.0)
HCT: 42.9 % (ref 39.0–52.0)
Hemoglobin: 15 g/dL (ref 13.0–17.0)
Lymphocytes Relative: 42.9 % (ref 12.0–46.0)
Lymphs Abs: 4 10*3/uL (ref 0.7–4.0)
MCHC: 34.9 g/dL (ref 30.0–36.0)
MCV: 91.2 fl (ref 78.0–100.0)
Monocytes Absolute: 0.6 10*3/uL (ref 0.1–1.0)
Monocytes Relative: 6.9 % (ref 3.0–12.0)
Neutro Abs: 4.4 10*3/uL (ref 1.4–7.7)
Neutrophils Relative %: 47.8 % (ref 43.0–77.0)
Platelets: 140 10*3/uL — ABNORMAL LOW (ref 150.0–400.0)
RBC: 4.71 Mil/uL (ref 4.22–5.81)
RDW: 14.1 % (ref 11.5–15.5)
WBC: 9.2 10*3/uL (ref 4.0–10.5)

## 2022-08-23 LAB — TSH: TSH: 1.79 u[IU]/mL (ref 0.35–5.50)

## 2022-08-24 ENCOUNTER — Encounter: Payer: Self-pay | Admitting: Internal Medicine

## 2022-08-24 ENCOUNTER — Ambulatory Visit: Payer: Medicare Other | Attending: Internal Medicine | Admitting: Internal Medicine

## 2022-08-24 VITALS — BP 122/70 | HR 72 | Ht 73.0 in | Wt 256.6 lb

## 2022-08-24 DIAGNOSIS — I2510 Atherosclerotic heart disease of native coronary artery without angina pectoris: Secondary | ICD-10-CM | POA: Diagnosis not present

## 2022-08-24 DIAGNOSIS — I480 Paroxysmal atrial fibrillation: Secondary | ICD-10-CM

## 2022-08-24 MED ORDER — DOXAZOSIN MESYLATE 4 MG PO TABS: 4.0000 mg | ORAL_TABLET | Freq: Every day | ORAL | 3 refills | Status: DC

## 2022-08-24 NOTE — Progress Notes (Signed)
Follow-up Outpatient Visit Date: 08/24/2022  Primary Care Provider: Tonia Ghent, MD [place] [address]  Chief Complaint: Follow-up coronary artery disease and atrial fibrillation  HPI:  James [name] is a 77 y.o. male with history of coronary artery disease status post PCI to the RCA (2019), persistent atrial fibrillation, thoracic aortic aneurysm, HFpEF, hypertension, hyperlipidemia, hypothyroidism, and obesity, who presents for follow-up of coronary artery disease, HFpEF, and atrial fibrillation.  I last saw him in October.  At that time he noted an episode of atrial fibrillation that he was able to detect with his Kardia mobile device, which resolved spontaneously after a day.  At our visit, he was in sinus rhythm but with higher heart rate than his baseline.  We added metoprolol succinate 25 mg daily.  TSH was still suppressed at 0.01.  Most recent TSH on 08/23/2022 had normalized at 1.79.  Today, James [name] reports that he has been feeling fairly well.  He denies chest pain, shortness breath, palpitations, lightheadedness, and edema.  He measures his blood pressure and heart rates at home and notes that they are typically normal.  He rarely has heart rates below 60 bpm.  He notes that he is currently taking a whole tablet of doxazosin rather than the half tablet we have discussed previously.  He otherwise remains compliant with his medications, including apixaban and dofetilide.  --------------------------------------------------------------------------------------------------  Past Medical History:  Diagnosis Date   (HFpEF) heart failure with preserved ejection fraction ([facility])    a. 05/2018 Echo: EF 55-60%, gr2 DD.   Acute lower GI bleeding after colonoscopy and polypectomy, resolved 12/12/2015   Ascending aortic aneurysm ([facility])    a. 02/2018 Echo: mildly dil Ao root/asc ao/arch; b. 05/2018 CTA Chest: 4.7cm fusiform aneurysm of Asc Ao.   CAD (coronary artery  disease)    a. 05/2018 Cath/PCI: LM nl,LAD 30p, 90/99d/apical, LCX 29m OM1/2/3 min irregs, RCA 85p (3.5x18 [place].   Cardiac murmur    a. 02/2018 Echo: EF 60-65%, no rwma, mild AI, mildly dil Ao root/Asc Ao/Arch, Mild MR, mildly dil LA. Nl RV fxn.   Cataract    bilateral surgery to remove   CHF (congestive heart failure) (H[facility])    Colon polyp    Constipation    Essential hypertension    Hemorrhoids    Hepatitis    self resolved, likely food exposure, 1974.    History of cardiovascular stress test    a. 02/2018 Myoview: EF 55-65%, small/mild apical defect w/ nl wall motion ->attenuation artifact. No ischemia. Low risk study.   Hx of adenomatous colonic polyps 12/05/2015   Hyperlipidemia    Hypothyroidism    Mitral regurgitation    a. 110/2019 Echo: EF 55-60%. Gr2 DD. Triv AI. Ao root 37m Mild MR. Mod dil LA, mildly dil RA.   Osteoarthritis    Persistent atrial fibrillation ([facility]   a. Dx 02/2018. CHA2DS2VASc = 2-->Eliquis; b. 03/2018 DCCV (200J); c. 03/2018 recurrent Afib-->flecainide started 04/2018;  d. 05/06/2018 s/p successful DCCV - 200J x 2-->on amio.   Skin cancer    basal cell, R ear, MOHS   Past Surgical History:  Procedure Laterality Date   BROW LIFT Bilateral 10/23/2016   Procedure: BLEPHAROPLASTY upper eyelid with excess skin;  Surgeon: AmKarle StarchMD;  [facility] Service: Ophthalmology;  Laterality: Bilateral;  MAC   CARDIOVERSION N/A 03/18/2018   Procedure: CARDIOVERSION;  Surgeon: EnNelva BushMD;  Location: [facility];  Service:  Cardiovascular;  Laterality: N/A;   CARDIOVERSION N/A 05/06/2018   Procedure: CARDIOVERSION;  Surgeon: Nelva Bush, MD;  Location: [facility];  Service: Cardiovascular;  Laterality: N/A;   CATARACT EXTRACTION  1986   OD   CATARACT EXTRACTION Bilateral 1995   x 2 for right and left    COLONOSCOPY     CORONARY STENT INTERVENTION N/A 05/13/2018   Procedure: CORONARY STENT INTERVENTION;  Surgeon: Nelva Bush,  MD;  Location: [facility];  Service: Cardiovascular;  Laterality: N/A;   ECTROPION REPAIR Bilateral 10/23/2016   Procedure: REPAIR OF ECTROPION sutures, extensive;  Surgeon: Karle Starch, MD;  Location: [facility];  Service: Ophthalmology;  Laterality: Bilateral;   LIPOMA EXCISION  06/1997   left neck ([place])   MOHS SURGERY Right 2013   behind right ear   RIGHT/LEFT HEART CATH AND CORONARY ANGIOGRAPHY N/A 05/12/2018   Procedure: RIGHT/LEFT HEART CATH AND CORONARY ANGIOGRAPHY;  Surgeon: Wellington Hampshire, MD;  Location: [facility];  Service: Cardiovascular;  Laterality: N/A;   TONSILLECTOMY     VASECTOMY  1982    Current Meds  Medication Sig   atorvastatin (LIPITOR) 80 MG tablet TAKE 1 TABLET BY MOUTH DAILY AT 6PM   diclofenac Sodium (VOLTAREN) 1 % GEL Apply 2 g topically 4 (four) times daily as needed.   dofetilide (TIKOSYN) 500 MCG capsule Take 1 capsule (500 mcg total) by mouth 2 (two) times daily.   doxazosin (CARDURA) 4 MG tablet Taking 1/2 tablet by mouth in the am   ELIQUIS 5 MG TABS tablet TAKE 1 TABLET BY MOUTH TWICE DAILY   ezetimibe (ZETIA) 10 MG tablet TAKE 1 TABLET BY MOUTH DAILY   finasteride (PROSCAR) 5 MG tablet TAKE 1 TABLET BY MOUTH EVERY DAY   furosemide (LASIX) 20 MG tablet TAKE 1 TABLET BY MOUTH EVERY OTHER DAY GENERIC EQUIVALENT FOR LASIX   ibuprofen (ADVIL) 200 MG tablet Take 400 mg by mouth every 6 (six) hours as needed for moderate pain.   levothyroxine (SYNTHROID) 125 MCG tablet Take 1 tablet (125 mcg total) by mouth daily before breakfast. Except skip dose on Sundays.  6 tabs per week.   metoprolol succinate (TOPROL XL) 25 MG 24 hr tablet Take 1 tablet (25 mg total) by mouth daily.   Multiple Vitamin (MULTIVITAMIN WITH MINERALS) TABS tablet Take 1 tablet by mouth daily. Senior Multivitamin   psyllium (METAMUCIL) 58.6 % packet Take 1 packet by mouth daily.   ramipril (ALTACE) 10 MG capsule TAKE ONE CAPSULE BY MOUTH DAILY GENERIC  EQUIVALENT FOR ALTACE   White Petrolatum-Mineral Oil (REFRESH P.M. OP) Place 1 drop into both eyes daily as needed (dry eyes).    Allergies: Sulfonamide derivatives  Social History   Tobacco Use   Smoking status: Former    Packs/day: 2.00    Years: 20.00    Total pack years: 40.00    Types: Cigarettes    Quit date: 08/07/1983    Years since quitting: 39.0    Passive exposure: Past   Smokeless tobacco: Never   Tobacco comments:    Former smoker 11/17/21 quit over 40 years ago  Vaping Use   Vaping Use: Never used  Substance Use Topics   Alcohol use: Yes    Alcohol/week: 7.0 standard drinks of alcohol    Types: 7 Shots of liquor per week    Comment: 1 at night before bed 09/28/21   Drug use: No    Family History  Problem Relation Age of  Onset   Stroke Mother    Lung cancer Maternal Grandfather        smoker   Stroke Maternal Grandfather    Heart disease Paternal Grandfather        MI, old age   Colon cancer Neg Hx    Colon polyps Neg Hx    Esophageal cancer Neg Hx    Rectal cancer Neg Hx    Stomach cancer Neg Hx    Bladder Cancer Neg Hx    Prostate cancer Neg Hx     Review of Systems: A 12-system review of systems was performed and was negative except as noted in the HPI.  --------------------------------------------------------------------------------------------------  Physical Exam: BP 122/70 (BP Location: Left Arm, Patient Position: Sitting, Cuff Size: Normal)   Pulse 72   Ht _0  (1.854 m)   Wt 256 lb 9.6 oz (116.4 kg)   SpO2 98%   BMI 33.85 kg/m   General:  NAD. Neck: No JVD or HJR. Lungs: Clear to auscultation bilaterally without wheezes or crackles. Heart: Regular rate and rhythm without murmurs, rubs, or gallops. Abdomen: Soft, nontender, nondistended. Extremities: Trace pretibial edema bilaterally.  EKG: Normal sinus rhythm with left anterior fascicular block and possible inferior infarct.  Heart rate has decreased since 05/07/2022.  Otherwise,  no significant interval change.  Lab Results  Component Value Date   WBC 9.2 08/23/2022   HGB 15.0 08/23/2022   HCT 42.9 08/23/2022   MCV 91.2 08/23/2022   PLT 140.0 (L) 08/23/2022    Lab Results  Component Value Date   NA 140 08/23/2022   K 4.8 08/23/2022   CL 105 08/23/2022   CO2 28 08/23/2022   BUN 21 08/23/2022   CREATININE 1.32 08/23/2022   GLUCOSE 86 08/23/2022   ALT 9 08/23/2022    Lab Results  Component Value Date   CHOL 121 05/25/2022   HDL 41 05/25/2022   LDLCALC 66 05/25/2022   LDLDIRECT 63 05/25/2021   TRIG 70 05/25/2022   CHOLHDL 3.0 05/25/2022    --------------------------------------------------------------------------------------------------  ASSESSMENT AND PLAN: Coronary artery disease: James [name] continues to do well without recurrent angina.  Continue secondary prevention with apixaban (and lieu of aspirin in the setting of atrial fibrillation) and atorvastatin.  Lipids well-controlled on last check in 05/2022.  Paroxysmal atrial fibrillation: James [name] is in sinus rhythm today and has not had any symptoms to suggest recurrent atrial fibrillation.  He should continue current regimen of apixaban, dofetilide, and metoprolol, with ongoing monitoring through the atrial fibrillation clinic.  QTc within normal limits today.  Chronic HFpEF: Other than trace pretibial edema, which is chronic, James [name] appears euvolemic.  He does not have any symptoms of heart failure.  Continue current medications including every other day furosemide.  Hypertension: Blood pressure well-controlled today.  Given that blood pressure readings are similar or even a little higher at home at times, we will have James [name] continue with a whole tablet (4 mg) of doxazosin.  No other medication changes today.  Hyperlipidemia: Lipids well-controlled with target LDL less than 70.  Continue atorvastatin 80 mg daily.  Hypothyroidism: Sinus tachycardia in the setting of iatrogenic  hyperthyroidism noted at our last visit has improved.  TSH checked yesterday normal.  Continue current dose of levothyroxine with ongoing management through Dr. Damita Dunnings.  Follow-up: Return to clinic in 1 year.  Nelva Bush, MD 08/24/2022 8:00 AM

## 2022-08-24 NOTE — Patient Instructions (Signed)
Medication Instructions:  Your Physician recommend you continue on your current medication as directed.    Continue taking Doxazosin 4 mg by mouth daily  *If you need a refill on your cardiac medications before your next appointment, please call your pharmacy*   Lab Work: None ordered today   Testing/Procedures: None ordered today   Follow-Up: At [facility], you and your health needs are our priority.  As part of our continuing mission to provide you with exceptional heart care, we have created designated Provider Care Teams.  These Care Teams include your primary Cardiologist (physician) and Advanced Practice Providers (APPs -  Physician Assistants and Nurse Practitioners) who all work together to provide you with the care you need, when you need it.  We recommend signing up for the patient portal called "MyChart".  Sign up information is provided on this After Visit Summary.  MyChart is used to connect with patients for Virtual Visits (Telemedicine).  Patients are able to view lab/test results, encounter notes, upcoming appointments, etc.  Non-urgent messages can be sent to your provider as well.   To learn more about what you can do with MyChart, go to [email or web address].    Your next appointment:   1 year(s)  Provider:   You may see Nelva Bush, MD or one of the following Advanced Practice Providers on your designated Care Team:   Murray Hodgkins, NP Christell Faith, PA-C Cadence Kathlen Mody, PA-C Gerrie Nordmann, NP

## 2022-08-31 ENCOUNTER — Ambulatory Visit (INDEPENDENT_AMBULATORY_CARE_PROVIDER_SITE_OTHER): Payer: Medicare Other | Admitting: Family Medicine

## 2022-08-31 ENCOUNTER — Encounter: Payer: Self-pay | Admitting: Family Medicine

## 2022-08-31 VITALS — BP 122/80 | HR 90 | Temp 97.8°F | Ht 73.0 in | Wt 258.0 lb

## 2022-08-31 DIAGNOSIS — I712 Thoracic aortic aneurysm, without rupture, unspecified: Secondary | ICD-10-CM | POA: Diagnosis not present

## 2022-08-31 DIAGNOSIS — I4891 Unspecified atrial fibrillation: Secondary | ICD-10-CM | POA: Diagnosis not present

## 2022-08-31 DIAGNOSIS — N401 Enlarged prostate with lower urinary tract symptoms: Secondary | ICD-10-CM

## 2022-08-31 DIAGNOSIS — E039 Hypothyroidism, unspecified: Secondary | ICD-10-CM | POA: Diagnosis not present

## 2022-08-31 DIAGNOSIS — Z0000 Encounter for general adult medical examination without abnormal findings: Secondary | ICD-10-CM

## 2022-08-31 DIAGNOSIS — E785 Hyperlipidemia, unspecified: Secondary | ICD-10-CM

## 2022-08-31 DIAGNOSIS — Z7189 Other specified counseling: Secondary | ICD-10-CM

## 2022-08-31 DIAGNOSIS — M25569 Pain in unspecified knee: Secondary | ICD-10-CM

## 2022-08-31 DIAGNOSIS — I10 Essential (primary) hypertension: Secondary | ICD-10-CM

## 2022-08-31 DIAGNOSIS — R351 Nocturia: Secondary | ICD-10-CM

## 2022-08-31 MED ORDER — APIXABAN 5 MG PO TABS: 5.0000 mg | ORAL_TABLET | Freq: Two times a day (BID) | ORAL | 3 refills | Status: DC

## 2022-08-31 NOTE — Patient Instructions (Addendum)
I'll check with GI and cardiology about swallowing and aorta follow up.   Take care.  Glad to see you.

## 2022-08-31 NOTE — Progress Notes (Unsigned)
Shingrix 2021 Flu 2023 Tetanus 2009. PNA up to date.  covid vaccine 2022 Prostate cancer screening and PSA options (with potential risks and benefits of testing vs not testing) were discussed along with recent recs/guidelines.  He declined testing PSA at this point. Colonoscopy 2021, due 2024, d/w pt.   Wife would be designated if patient were incapacitated.   He had RSV vaccine 2023.    Dysphagia.   No choking.  He has sensation of occ pill sticking in the upper esophagus.  Occ coughs up a pill.  No vomiting.  Discussed with patient that I need input from Dr. Carlean Purl with GI.  Prev imaging with IMPRESSION: 1.  Normal pharyngeal anatomy and motility.   2.  No evidence of esophageal stricture.   3. Mild dysmotility seen in the thoracic esophagus as can be seen with spasm versus presbyesophagus.   4.  No hiatal hernia seen.   5.  No evidence of gastroesophageal reflux seen.    Diet and exercise d/w pt.  Exercise limited by knee and hip pain.  He had prev injection per ortho with transient relief.  He is trying to increase his exercise distance.  He is going to start using a treadmill.  More pain with stairs, esp going down.     He is managing BPH sx. Still on med tx.  He can update me as needed.   Elevated Cholesterol: Using medications without problems:yes Muscle aches:  not from statin  Diet compliance: yes Exercise: d/w pt.   Prev labs d/w pt.     Hypertension:               Using medication without problems or lightheadedness: yes Chest pain with exertion: no Edema: no Short of breath: no   Hypothyroidism.   TSH wnl.  Still on levothyroxine.  Compliant.     H/o ascending thoracic aortic diameter followed by cardiology.   He has A fib.  Still anticoagulated, no bleeding.     Meds, vitals, and allergies reviewed.    PMH and SH reviewed   ROS: Per HPI unless specifically indicated in ROS section    GEN: nad, alert and oriented HEENT: ncat NECK: supple w/o LA CV:  rrr. PULM: ctab, no inc wob ABD: soft, +bs EXT: no edema SKIN: well perfused.

## 2022-09-02 NOTE — Assessment & Plan Note (Signed)
He is going to try to increase his tolerable exercise distance by using a treadmill.  Has been seen by orthopedics.

## 2022-09-02 NOTE — Assessment & Plan Note (Signed)
I am going to check with cardiology about when he needs imaging follow-up.  Greatly appreciate cardiology input.

## 2022-09-02 NOTE — Assessment & Plan Note (Signed)
Shingrix 2021 Flu 2023 Tetanus 2009. PNA up to date.  covid vaccine 2022 Prostate cancer screening and PSA options (with potential risks and benefits of testing vs not testing) were discussed along with recent recs/guidelines.  He declined testing PSA at this point. Colonoscopy 2021, due 2024, d/w pt.   Wife would be designated if patient were incapacitated.   He had RSV vaccine 2023.

## 2022-09-02 NOTE — Assessment & Plan Note (Signed)
  Wife would be designated if patient were incapacitated.

## 2022-09-02 NOTE — Assessment & Plan Note (Signed)
Continue atorvastatin and Zetia. 

## 2022-09-02 NOTE — Assessment & Plan Note (Signed)
Still anticoagulated, no bleeding.  Would continue as is with Eliquis.

## 2022-09-02 NOTE — Assessment & Plan Note (Addendum)
Continue finasteride and doxazosin.

## 2022-09-02 NOTE — Assessment & Plan Note (Signed)
Continue ramipril metoprolol furosemide doxazosin.

## 2022-09-02 NOTE — Assessment & Plan Note (Signed)
I need input from Dr. Carlean Purl about options going forward, up to and including potential EGD.  I routed this note with appreciation.

## 2022-09-02 NOTE — Assessment & Plan Note (Signed)
TSH wnl.  Still on levothyroxine.  Compliant.   Continue levothyroxine as is.

## 2022-09-13 ENCOUNTER — Encounter (HOSPITAL_COMMUNITY): Payer: Self-pay | Admitting: *Deleted

## 2022-09-24 DIAGNOSIS — H04123 Dry eye syndrome of bilateral lacrimal glands: Secondary | ICD-10-CM | POA: Diagnosis not present

## 2022-09-24 DIAGNOSIS — Z961 Presence of intraocular lens: Secondary | ICD-10-CM | POA: Diagnosis not present

## 2022-09-24 DIAGNOSIS — H35372 Puckering of macula, left eye: Secondary | ICD-10-CM | POA: Diagnosis not present

## 2022-09-24 DIAGNOSIS — H26491 Other secondary cataract, right eye: Secondary | ICD-10-CM | POA: Diagnosis not present

## 2022-09-26 ENCOUNTER — Telehealth: Payer: Self-pay | Admitting: Family Medicine

## 2022-09-26 ENCOUNTER — Other Ambulatory Visit: Payer: Self-pay | Admitting: Family Medicine

## 2022-09-26 ENCOUNTER — Other Ambulatory Visit: Payer: Self-pay | Admitting: Internal Medicine

## 2022-09-26 DIAGNOSIS — E039 Hypothyroidism, unspecified: Secondary | ICD-10-CM

## 2022-09-26 DIAGNOSIS — R1310 Dysphagia, unspecified: Secondary | ICD-10-CM

## 2022-09-26 NOTE — Telephone Encounter (Signed)
Please check with patient.  I got an update from Dr. Carlean Purl with GI about his swallowing.  Dr. Carlean Purl advised doing a barium swallow study (including swallowing a tablet during the study).  If he has difficulty swallowing the tablet then the GI clinic can see him for consideration of an endoscopy.  Let me know if he wants me to proceed with the barium swallow order.  Thanks.

## 2022-09-28 NOTE — Telephone Encounter (Signed)
Spoke with patient and he is okay with doing the barium swallow if insurance will cover it.

## 2022-09-30 NOTE — Telephone Encounter (Signed)
I put in the order.  Please let me know if he has trouble getting scheduled.  He should get contacted within a few days.  Thanks.

## 2022-09-30 NOTE — Addendum Note (Signed)
Addended by: Tonia Ghent on: 09/30/2022 02:12 PM   Modules accepted: Orders

## 2022-10-02 ENCOUNTER — Other Ambulatory Visit: Payer: Self-pay | Admitting: Family Medicine

## 2022-10-02 ENCOUNTER — Other Ambulatory Visit: Payer: Self-pay | Admitting: Internal Medicine

## 2022-10-16 ENCOUNTER — Ambulatory Visit
Admission: RE | Admit: 2022-10-16 | Discharge: 2022-10-16 | Disposition: A | Discharge status: Home / Self Care | Payer: Medicare Other | Source: Ambulatory Visit | Attending: Family Medicine | Admitting: Family Medicine

## 2022-10-16 DIAGNOSIS — R1310 Dysphagia, unspecified: Secondary | ICD-10-CM

## 2022-10-16 DIAGNOSIS — K219 Gastro-esophageal reflux disease without esophagitis: Secondary | ICD-10-CM | POA: Diagnosis not present

## 2022-10-16 DIAGNOSIS — K449 Diaphragmatic hernia without obstruction or gangrene: Secondary | ICD-10-CM | POA: Diagnosis not present

## 2022-10-25 ENCOUNTER — Ambulatory Visit: Payer: Medicare Other | Admitting: Surgical

## 2022-10-25 ENCOUNTER — Other Ambulatory Visit (INDEPENDENT_AMBULATORY_CARE_PROVIDER_SITE_OTHER): Payer: Medicare Other

## 2022-10-25 ENCOUNTER — Other Ambulatory Visit: Payer: Self-pay

## 2022-10-25 ENCOUNTER — Ambulatory Visit: Payer: Medicare Other

## 2022-10-25 VITALS — Ht 71.5 in | Wt 257.6 lb

## 2022-10-25 DIAGNOSIS — R0989 Other specified symptoms and signs involving the circulatory and respiratory systems: Secondary | ICD-10-CM | POA: Diagnosis not present

## 2022-10-25 DIAGNOSIS — G8929 Other chronic pain: Secondary | ICD-10-CM

## 2022-10-25 DIAGNOSIS — M1712 Unilateral primary osteoarthritis, left knee: Secondary | ICD-10-CM | POA: Diagnosis not present

## 2022-10-25 DIAGNOSIS — M25562 Pain in left knee: Secondary | ICD-10-CM | POA: Diagnosis not present

## 2022-10-26 ENCOUNTER — Ambulatory Visit (HOSPITAL_COMMUNITY)
Admission: RE | Admit: 2022-10-26 | Discharge: 2022-10-26 | Disposition: A | Discharge status: Home / Self Care | Payer: Medicare Other | Source: Ambulatory Visit | Attending: Surgical | Admitting: Surgical

## 2022-10-26 ENCOUNTER — Telehealth: Payer: Self-pay | Admitting: Internal Medicine

## 2022-10-26 ENCOUNTER — Telehealth: Payer: Self-pay

## 2022-10-26 ENCOUNTER — Encounter: Payer: Self-pay | Admitting: Surgical

## 2022-10-26 DIAGNOSIS — R0989 Other specified symptoms and signs involving the circulatory and respiratory systems: Secondary | ICD-10-CM | POA: Diagnosis not present

## 2022-10-26 DIAGNOSIS — M25562 Pain in left knee: Secondary | ICD-10-CM | POA: Insufficient documentation

## 2022-10-26 DIAGNOSIS — G8929 Other chronic pain: Secondary | ICD-10-CM | POA: Diagnosis not present

## 2022-10-26 NOTE — Telephone Encounter (Signed)
   Name: James [name]  DOB: 11/29/1945  MRN: [id number]  Primary Cardiologist: Nelva Bush, MD  Chart reviewed as part of pre-operative protocol coverage. Because of Tyair Lohmeyer Patchin's past medical history and time since last visit, he will require a follow-up telephone visit in order to better assess preoperative cardiovascular risk.  Pre-op covering staff: - Please schedule appointment and call patient to inform them. If patient already had an upcoming appointment within acceptable timeframe, please add "pre-op clearance" to the appointment notes so provider is aware. - Please contact requesting surgeon's office via preferred method (i.e, phone, fax) to inform them of need for appointment prior to surgery.  Per office protocol, patient can hold Eliquis for 3 days prior to procedure.    Elgie Collard, PA-C  10/26/2022, 12:14 PM

## 2022-10-26 NOTE — Telephone Encounter (Signed)
   Pre-operative Risk Assessment    Patient Name: James [name]  DOB: 1946-01-28 MRN: [id number]      Request for Surgical Clearance    Procedure:   left total knee arthroplasty  Date of Surgery:  Clearance 11/27/22                                 Surgeon:  Dr Anderson Malta Surgeon's Group or Practice Name:  [facility] Phone number:  [phone] Fax number:  [phone]   Type of Clearance Requested:   - Medical    Type of Anesthesia:  Spinal   Additional requests/questions:  Please advise surgeon/provider what medications should be held.  Manfred Arch   10/26/2022, 9:46 AM

## 2022-10-26 NOTE — Progress Notes (Signed)
Office Visit Note   Patient: James [name]           Date of Birth: [birth year]           MRN: [id number] Visit Date: 10/25/2022 Requested by: Tonia Ghent, MD [place],  [place] [address] PCP: Tonia Ghent, MD  Subjective: Chief Complaint  Patient presents with   Left Knee - Pain    HPI: James [name] is a 77 y.o. male who presents to the office reporting left knee pain.  Patient has history of left knee osteoarthritis with knee pain over the last 10 to 15 years.  No history of injury.  He states that he complains of primarily anterior medial pain in the left knee without radiation.  No groin pain.  No history of prior knee surgery.  He has had prior aspiration but no recent injection.  Pain wakes him up at night.  It is limiting his ability to go hiking and for long walks which is frustrating for him.Marland Kitchen  He was previously posted for knee replacement about 8 years ago with Dr. Marlou Sa but decided against it as he thought he could live with his symptoms.  He now states that he has gone to the point where he does not want to live with this pain anymore.  He feels like the knee is unstable and wants to give out on him constantly.  No falls that he has had.  Uses ibuprofen orally and Voltaren gel topically.  He denies any history of diabetes, CKD, stroke, smoking.  He does have history of atrial fibrillation.  His cardiologist is Dr. Saunders Revel and he takes Eliquis.  He has been off his Eliquis before..                ROS: All systems reviewed are negative as they relate to the chief complaint within the history of present illness.  Patient denies fevers or chills.  Assessment & Plan: Visit Diagnoses:  1. Primary osteoarthritis of left knee   2. Absent pedal pulses     Plan: Patient is a 77 year old male who presents for evaluation of left knee pain.  He has history of left knee osteoarthritis.  Today's radiographs demonstrate severe medial and patellofemoral compartment  arthritis with mild to moderate degenerative changes in the lateral compartment.  There is mild varus alignment.  He has been living with this pain for years.  It has been been progressively worsening to the point where he is considering surgical intervention.  We discussed the options available to patient including living with his symptoms versus aspiration/injection versus physical therapy versus total knee replacement.  We had a lengthy discussion about the risks and benefits of knee replacement surgery including but not limited to the risk of nerve/blood vessel damage, persistent knee stiffness, infection, need for revision surgery in the future, fracture, medical complication from surgery such as DVT/PE/heart attack/stroke/death.  We also had a lengthy discussion on the recovery timeframe and what to expect at the time of surgery as far as the fairly severe level of pain after surgery and the need for intensive rehabilitation for about 2 to 3 months postoperatively.  After this discussion, he would like to proceed with knee replacement surgery.  We will post him for knee replacement with Dr. Marlou Sa of the left knee.  We will obtain cardiac risk stratification by his cardiologist, Dr. Saunders Revel.  Will need to determine when he is okay to come off of  his Eliquis.  Anticipate spinal anesthetic at the time of procedure.  Also obtained ABIs with his absent pedal pulses on exam today.  ABI of the left leg was 1.22.  Follow-up after procedure.  Call with any questions in the meantime.  Follow-Up Instructions: No follow-ups on file.   Orders:  Orders Placed This Encounter  Procedures   XR KNEE 3 VIEW LEFT   VAS [place] ABI WITH/WO TBI   No orders of the defined types were placed in this encounter.     Procedures: No procedures performed   Clinical Data: No additional findings.  Objective: Vital Signs: Ht 5' 11.5" (1.816 m)   Wt 257 lb 9.6 oz (116.8 kg)   BMI 35.43 kg/m   Physical Exam:  Constitutional:  Patient appears well-developed HEENT:  Head: Normocephalic Eyes:EOM are normal Neck: Normal range of motion Cardiovascular: Normal rate Pulmonary/chest: Effort normal Neurologic: Patient is alert Skin: Skin is warm Psychiatric: Patient has normal mood and affect  Ortho Exam: Ortho exam demonstrates left knee with small effusion.  He has no cellulitis or skin changes noted over the skin of the left knee.  He has range of motion from about 5 degrees extension to 115 degrees of knee flexion.  Mild varus alignment that is partially correctable.  Tenderness primarily over the medial joint line.  He has intact ankle dorsiflexion, plantarflexion.  Able to perform straight leg raise without extensor lag.  Excellent quad strength rated 5/5.  No tenderness over the lateral joint line.  Mild patellofemoral crepitus noted.  He has no pain with hip range of motion.  Negative FADIR sign.  Negative straight leg raise.  No palpable DP or PT pulses on exam today.  He did have ABIs following today's visit that demonstrated ABI of the left leg of 1.22.  Specialty Comments:  No specialty comments available.  Imaging: VAS [place] ABI WITH/WO TBI  Result Date: 10/26/2022  LOWER EXTREMITY DOPPLER STUDY Patient Name:  James [name]  Date of Exam:   10/26/2022 Medical Rec #: [id number]       Accession #:    [id number] Date of Birth: 07/19/1946      Patient Gender: M Patient Age:   25 years Exam Location:  [facility] Procedure:      VAS [place] ABI WITH/WO TBI Referring Phys: Gloriann Loan --------------------------------------------------------------------------------  Indications: Chronic pain of left knee; Absent pedal pulses High Risk Factors: Hypertension.  Performing Technologist: Carlos Levering RVT  Examination Guidelines: A complete evaluation includes at minimum, Doppler waveform signals and systolic blood pressure reading at the level of bilateral brachial, anterior tibial, and posterior tibial arteries, when  vessel segments are accessible. Bilateral testing is considered an integral part of a complete examination. Photoelectric Plethysmograph (PPG) waveforms and toe systolic pressure readings are included as required and additional duplex testing as needed. Limited examinations for reoccurring indications may be performed as noted.  ABI Findings: +---------+------------------+-----+-----------+--------+ Right    Rt Pressure (mmHg)IndexWaveform   Comment  +---------+------------------+-----+-----------+--------+ Brachial 181                    triphasic           +---------+------------------+-----+-----------+--------+ PTA      222               1.23 multiphasic         +---------+------------------+-----+-----------+--------+ DP       215  1.19 multiphasic         +---------+------------------+-----+-----------+--------+ Great Toe148               0.82                     +---------+------------------+-----+-----------+--------+ +---------+------------------+-----+-----------+-------+ Left     Lt Pressure (mmHg)IndexWaveform   Comment +---------+------------------+-----+-----------+-------+ Brachial 178                    triphasic          +---------+------------------+-----+-----------+-------+ PTA      220               1.22 multiphasic        +---------+------------------+-----+-----------+-------+ DP       214               1.18 multiphasic        +---------+------------------+-----+-----------+-------+ Great Toe112               0.62                    +---------+------------------+-----+-----------+-------+ +-------+-----------+-----------+------------+------------+ ABI/TBIToday's ABIToday's TBIPrevious ABIPrevious TBI +-------+-----------+-----------+------------+------------+ Right  1.23       0.82                                +-------+-----------+-----------+------------+------------+ Left   1.22       0.62                                 +-------+-----------+-----------+------------+------------+  Summary: Right: Resting right ankle-brachial index is within normal range. The right toe-brachial index is normal. Left: Resting left ankle-brachial index is within normal range. The left toe-brachial index is abnormal. *See table(s) above for measurements and observations.     Preliminary      PMFS History: Patient Active Problem List   Diagnosis Date Noted   Pruritus 03/21/2022   Afib ([place]) 11/07/2021   Rib injury 10/03/2021   Hyperglycemia 08/27/2021   Dysphagia 08/27/2021   Thoracic aortic aneurysm without rupture ([place]) 11/25/2019   Chronic heart failure with preserved ejection fraction (HFpEF) ([place]) 08/18/2018   Hyperlipidemia LDL goal <70 08/18/2018   Obstructive sleep apnea 05/23/2018   Coronary artery disease involving native coronary artery of native heart without angina pectoris 05/16/2018   Unstable angina (HCC)    CHF (congestive heart failure) ([place]) 05/09/2018   Benign prostatic hyperplasia with nocturia 05/07/2018   Dyspnea on exertion 02/19/2018   Persistent atrial fibrillation ([place]) 02/12/2018   Cardiac murmur 02/12/2018   Knee pain 08/07/2017   Weak urinary stream 05/01/2016   Healthcare maintenance 05/01/2016   Hematuria 03/06/2016   Hx of adenomatous colonic polyps 12/05/2015   Advance care planning 07/21/2014   Medicare annual wellness visit, subsequent 07/01/2012   External hemorrhoids 06/27/2011   Hypothyroidism 02/24/2008   HYPERCHOLESTEROLEMIA 02/21/2007   Essential hypertension 02/21/2007   DEGENERATIVE JOINT DISEASE, CERVICAL SPINE 02/21/2007   Past Medical History:  Diagnosis Date   (HFpEF) heart failure with preserved ejection fraction ([place])    a. 05/2018 Echo: EF 55-60%, gr2 DD.   Acute lower GI bleeding after colonoscopy and polypectomy, resolved 12/12/2015   Ascending aortic aneurysm ([place])    a. 02/2018 Echo: mildly dil Ao root/asc ao/arch; b. 05/2018 CTA Chest:  4.7cm fusiform aneurysm of Asc Ao.   CAD (coronary artery  disease)    a. 05/2018 Cath/PCI: LM nl,LAD 30p, 90/99d/apical, LCX 97m, OM1/2/3 min irregs, RCA 85p (3.5x18 [place]).   Cardiac murmur    a. 02/2018 Echo: EF 60-65%, no rwma, mild AI, mildly dil Ao root/Asc Ao/Arch, Mild MR, mildly dil LA. Nl RV fxn.   Cataract    bilateral surgery to remove   CHF (congestive heart failure) (HCC)    Colon polyp    Constipation    Essential hypertension    Hemorrhoids    Hepatitis    self resolved, likely food exposure, 1974.    History of cardiovascular stress test    a. 02/2018 Myoview: EF 55-65%, small/mild apical defect w/ nl wall motion ->attenuation artifact. No ischemia. Low risk study.   Hx of adenomatous colonic polyps 12/05/2015   Hyperlipidemia    Hypothyroidism    Mitral regurgitation    a. 110/2019 Echo: EF 55-60%. Gr2 DD. Triv AI. Ao root 36mm. Mild MR. Mod dil LA, mildly dil RA.   Osteoarthritis    Persistent atrial fibrillation ([place])    a. Dx 02/2018. CHA2DS2VASc = 2-->Eliquis; b. 03/2018 DCCV (200J); c. 03/2018 recurrent Afib-->flecainide started 04/2018;  d. 05/06/2018 s/p successful DCCV - 200J x 2-->on amio.   Skin cancer    basal cell, R ear, MOHS    Family History  Problem Relation Age of Onset   Stroke Mother    Lung cancer Maternal Grandfather        smoker   Stroke Maternal Grandfather    Heart disease Paternal Grandfather        MI, old age   Colon cancer Neg Hx    Colon polyps Neg Hx    Esophageal cancer Neg Hx    Rectal cancer Neg Hx    Stomach cancer Neg Hx    Bladder Cancer Neg Hx    Prostate cancer Neg Hx     Past Surgical History:  Procedure Laterality Date   BROW LIFT Bilateral 10/23/2016   Procedure: BLEPHAROPLASTY upper eyelid with excess skin;  Surgeon: Karle Starch, MD;  Location: [facility];  Service: Ophthalmology;  Laterality: Bilateral;  MAC   CARDIOVERSION N/A 03/18/2018   Procedure: CARDIOVERSION;  Surgeon: Nelva Bush, MD;   Location: [facility];  Service: Cardiovascular;  Laterality: N/A;   CARDIOVERSION N/A 05/06/2018   Procedure: CARDIOVERSION;  Surgeon: Nelva Bush, MD;  Location: [facility];  Service: Cardiovascular;  Laterality: N/A;   CATARACT EXTRACTION  1986   OD   CATARACT EXTRACTION Bilateral 1995   x 2 for right and left    COLONOSCOPY     CORONARY STENT INTERVENTION N/A 05/13/2018   Procedure: CORONARY STENT INTERVENTION;  Surgeon: Nelva Bush, MD;  Location: [facility];  Service: Cardiovascular;  Laterality: N/A;   ECTROPION REPAIR Bilateral 10/23/2016   Procedure: REPAIR OF ECTROPION sutures, extensive;  Surgeon: Karle Starch, MD;  Location: [facility];  Service: Ophthalmology;  Laterality: Bilateral;   LIPOMA EXCISION  06/1997   left neck ([place])   MOHS SURGERY Right 2013   behind right ear   RIGHT/LEFT HEART CATH AND CORONARY ANGIOGRAPHY N/A 05/12/2018   Procedure: RIGHT/LEFT HEART CATH AND CORONARY ANGIOGRAPHY;  Surgeon: Wellington Hampshire, MD;  Location: [facility];  Service: Cardiovascular;  Laterality: N/A;   TONSILLECTOMY     VASECTOMY  1982   Social History   Occupational History   Occupation: Retired    Fish farm manager: RETIRED    Comment: 1  Tobacco  Use   Smoking status: Former    Packs/day: 2.00    Years: 20.00    Additional pack years: 0.00    Total pack years: 40.00    Types: Cigarettes    Quit date: 08/07/1983    Years since quitting: 39.2    Passive exposure: Past   Smokeless tobacco: Never   Tobacco comments:    Former smoker 11/17/21 quit over 40 years ago  Vaping Use   Vaping Use: Never used  Substance and Sexual Activity   Alcohol use: Not Currently    Comment: 1-2 per day   Drug use: No   Sexual activity: Yes    Partners: Female

## 2022-10-26 NOTE — Progress Notes (Signed)
ABI's have been completed. Preliminary results can be found in CV Proc through chart review.   10/26/22 8:37 AM James [name] RVT

## 2022-10-26 NOTE — Telephone Encounter (Signed)
Spoke with who is agreeable to do a tele visit on 4/3 at 10:20 am. Med rec ans consent have been done.

## 2022-10-26 NOTE — Telephone Encounter (Signed)
Patient with diagnosis of afib on Eliquis for anticoagulation.    Procedure: left total knee arthroplasty Date of procedure: 11/27/22  CHA2DS2-VASc Score = 5  This indicates a 7.2% annual risk of stroke. The patient's score is based upon: CHF History: 1 HTN History: 1 Diabetes History: 0 Stroke History: 0 Vascular Disease History: 1 Age Score: 2 Gender Score: 0   CrCl 68mL/min using adj body weight Platelet count 140K  Per office protocol, patient can hold Eliquis for 3 days prior to procedure.    **This guidance is not considered finalized until pre-operative APP has relayed final recommendations.**

## 2022-10-26 NOTE — Telephone Encounter (Signed)
  Patient Consent for Virtual Visit        James [name] has provided verbal consent on 10/26/2022 for a virtual visit (video or telephone).   CONSENT FOR VIRTUAL VISIT FOR:  James [name]  By participating in this virtual visit I agree to the following:  I hereby voluntarily request, consent and authorize [facility] and its employed or contracted physicians, physician assistants, nurse practitioners or other licensed health care professionals (the Practitioner), to provide me with telemedicine health care services (the "Services") as deemed necessary by the treating Practitioner. I acknowledge and consent to receive the Services by the Practitioner via telemedicine. I understand that the telemedicine visit will involve communicating with the Practitioner through live audiovisual communication technology and the disclosure of certain medical information by electronic transmission. I acknowledge that I have been given the opportunity to request an in-person assessment or other available alternative prior to the telemedicine visit and am voluntarily participating in the telemedicine visit.  I understand that I have the right to withhold or withdraw my consent to the use of telemedicine in the course of my care at any time, without affecting my right to future care or treatment, and that the Practitioner or I may terminate the telemedicine visit at any time. I understand that I have the right to inspect all information obtained and/or recorded in the course of the telemedicine visit and may receive copies of available information for a reasonable fee.  I understand that some of the potential risks of receiving the Services via telemedicine include:  Delay or interruption in medical evaluation due to technological equipment failure or disruption; Information transmitted may not be sufficient (e.g. poor resolution of images) to allow for appropriate medical decision making by the Practitioner;  and/or  In rare instances, security protocols could fail, causing a breach of personal health information.  Furthermore, I acknowledge that it is my responsibility to provide information about my medical history, conditions and care that is complete and accurate to the best of my ability. I acknowledge that Practitioner's advice, recommendations, and/or decision may be based on factors not within their control, such as incomplete or inaccurate data provided by me or distortions of diagnostic images or specimens that may result from electronic transmissions. I understand that the practice of medicine is not an exact science and that Practitioner makes no warranties or guarantees regarding treatment outcomes. I acknowledge that a copy of this consent can be made available to me via my patient portal ([facility]), or I can request a printed copy by calling the office of [facility].    I understand that my insurance will be billed for this visit.   I have read or had this consent read to me. I understand the contents of this consent, which adequately explains the benefits and risks of the Services being provided via telemedicine.  I have been provided ample opportunity to ask questions regarding this consent and the Services and have had my questions answered to my satisfaction. I give my informed consent for the services to be provided through the use of telemedicine in my medical care

## 2022-10-27 LAB — VAS US ABI WITH/WO TBI
Left ABI: 1.22
Right ABI: 1.23

## 2022-11-05 NOTE — Progress Notes (Unsigned)
Virtual Visit via Telephone Note   Because of James [name]'s co-morbid illnesses, he is at least at moderate risk for complications without adequate follow up.  This format is felt to be most appropriate for this patient at this time.  The patient did not have access to video technology/had technical difficulties with video requiring transitioning to audio format only (telephone).  All issues noted in this document were discussed and addressed.  No physical exam could be performed with this format.  Please refer to the patient's chart for his consent to telehealth for [facility]  Evaluation Performed:  Preoperative cardiovascular risk assessment _____________   Date:  11/05/2022   Patient ID:  James [name], DOB [birth year], MRN [id number] Patient Location:  Home Provider location:   Office  Primary Care Provider:  Tonia Ghent, MD Primary Cardiologist:  James Bush, MD  Chief Complaint / Patient Profile   77 y.o. y/o male with a h/o coronary artery disease status post PCI to the RCA (2019), persistent atrial fibrillation, thoracic aortic aneurysm, HFpEF, hypertension, hyperlipidemia, hypothyroidism, and obesity  who is pending left total knee arthroplasty and presents today for telephonic preoperative cardiovascular risk assessment.  History of Present Illness    James [name] is a 77 y.o. male who presents via audio/video conferencing for a telehealth visit today.  Pt was last seen in cardiology clinic on 08/24/2022 by Dr James [name].  At that time James [name] was doing well and denied any chest pain, palpitations, or shortness of breath..  The patient is now pending procedure as outlined above. Since his last visit, he ***  *** denies chest pain, shortness of breath, lower extremity edema, fatigue, palpitations, melena, hematuria, hemoptysis, diaphoresis, weakness, presyncope, syncope, orthopnea, and PND.    Per office protocol, patient can hold Eliquis for 3 days  prior to procedure.    Past Medical History    Past Medical History:  Diagnosis Date   (HFpEF) heart failure with preserved ejection fraction ([place])    a. 05/2018 Echo: EF 55-60%, gr2 DD.   Acute lower GI bleeding after colonoscopy and polypectomy, resolved 12/12/2015   Ascending aortic aneurysm ([place])    a. 02/2018 Echo: mildly dil Ao root/asc ao/arch; b. 05/2018 CTA Chest: 4.7cm fusiform aneurysm of Asc Ao.   CAD (coronary artery disease)    a. 05/2018 Cath/PCI: LM nl,LAD 30p, 90/99d/apical, LCX 59m, OM1/2/3 min irregs, RCA 85p (3.5x18 [place]).   Cardiac murmur    a. 02/2018 Echo: EF 60-65%, no rwma, mild AI, mildly dil Ao root/Asc Ao/Arch, Mild MR, mildly dil LA. Nl RV fxn.   Cataract    bilateral surgery to remove   CHF (congestive heart failure) (HCC)    Colon polyp    Constipation    Essential hypertension    Hemorrhoids    Hepatitis    self resolved, likely food exposure, 1974.    History of cardiovascular stress test    a. 02/2018 Myoview: EF 55-65%, small/mild apical defect w/ nl wall motion ->attenuation artifact. No ischemia. Low risk study.   Hx of adenomatous colonic polyps 12/05/2015   Hyperlipidemia    Hypothyroidism    Mitral regurgitation    a. 110/2019 Echo: EF 55-60%. Gr2 DD. Triv AI. Ao root 35mm. Mild MR. Mod dil LA, mildly dil RA.   Osteoarthritis    Persistent atrial fibrillation ([place])    a. Dx 02/2018. CHA2DS2VASc = 2-->Eliquis; b. 03/2018 DCCV (200J); c. 03/2018 recurrent Afib-->flecainide started 04/2018;  d. 05/06/2018 s/p successful DCCV - 200J x 2-->on amio.   Skin cancer    basal cell, R ear, MOHS   Past Surgical History:  Procedure Laterality Date   BROW LIFT Bilateral 10/23/2016   Procedure: BLEPHAROPLASTY upper eyelid with excess skin;  Surgeon: James Starch, MD;  Location: [place];  Service: Ophthalmology;  Laterality: Bilateral;  MAC   CARDIOVERSION N/A 03/18/2018   Procedure: CARDIOVERSION;  Surgeon: James Bush, MD;  Location:  [place] [facility];  Service: Cardiovascular;  Laterality: N/A;   CARDIOVERSION N/A 05/06/2018   Procedure: CARDIOVERSION;  Surgeon: James Bush, MD;  Location: [facility];  Service: Cardiovascular;  Laterality: N/A;   CATARACT EXTRACTION  1986   OD   CATARACT EXTRACTION Bilateral 1995   x 2 for right and left    COLONOSCOPY     CORONARY STENT INTERVENTION N/A 05/13/2018   Procedure: CORONARY STENT INTERVENTION;  Surgeon: James Bush, MD;  Location: [place] [facility];  Service: Cardiovascular;  Laterality: N/A;   ECTROPION REPAIR Bilateral 10/23/2016   Procedure: REPAIR OF ECTROPION sutures, extensive;  Surgeon: James Starch, MD;  Location: [place];  Service: Ophthalmology;  Laterality: Bilateral;   LIPOMA EXCISION  06/1997   left neck (Juengel)   MOHS SURGERY Right 2013   behind right ear   RIGHT/LEFT HEART CATH AND CORONARY ANGIOGRAPHY N/A 05/12/2018   Procedure: RIGHT/LEFT HEART CATH AND CORONARY ANGIOGRAPHY;  Surgeon: James Hampshire, MD;  Location: [place] [facility];  Service: Cardiovascular;  Laterality: N/A;   TONSILLECTOMY     VASECTOMY  1982    Allergies  Allergies  Allergen Reactions   Sulfonamide Derivatives Rash    childhood    Home Medications    Prior to Admission medications   Medication Sig Start Date James [name] Date Taking? Authorizing Provider  apixaban (ELIQUIS) 5 MG TABS tablet Take 1 tablet (5 mg total) by mouth 2 (two) times daily. 08/31/22   James Ghent, MD  atorvastatin (LIPITOR) 80 MG tablet TAKE 1 TABLET BY MOUTH DAILY AT [facility] 10/02/22   James [name], James Gave, MD  diclofenac Sodium (VOLTAREN) 1 % GEL Apply 2 g topically 4 (four) times daily as needed. 09/05/20   James Ghent, MD  dofetilide (TIKOSYN) 500 MCG capsule Take 1 capsule (500 mcg total) by mouth 2 (two) times daily. 08/15/22   James Needs, James [name]  doxazosin (CARDURA) 4 MG tablet TAKE 1 TABLET BY MOUTH DAILY AFTER BREAKFAST 10/02/22   James Ghent, MD  ezetimibe (ZETIA) 10 MG tablet  TAKE 1 TABLET BY MOUTH DAILY 09/26/22   James [name], James Gave, MD  finasteride (PROSCAR) 5 MG tablet TAKE 1 TABLET BY MOUTH EVERY DAY 09/26/22   James Ghent, MD  furosemide (LASIX) 20 MG tablet TAKE 1 TABLET BY MOUTH EVERY OTHER DAY GENERIC EQUIVALENT FOR LASIX 07/10/22   James Ghent, MD  ibuprofen (ADVIL) 200 MG tablet Take 400 mg by mouth every 6 (six) hours as needed for moderate pain.    [provider]  levothyroxine (SYNTHROID) 125 MCG tablet TAKE 1 TABLET BY MOUTH DAILY BEFORE BREAKFAST. 09/26/22   James Ghent, MD  metoprolol succinate (TOPROL XL) 25 MG 24 hr tablet Take 1 tablet (25 mg total) by mouth daily. 05/25/22   James [name], James Gave, MD  Multiple Vitamin (MULTIVITAMIN WITH MINERALS) TABS tablet Take 1 tablet by mouth daily. Senior Multivitamin    [provider]  psyllium (METAMUCIL) 58.6 % packet Take 1 packet by mouth daily.  [provider]  ramipril (ALTACE) 10 MG capsule TAKE ONE CAPSULE BY MOUTH DAILY. GENERIC EQUIVALENT FOR ALTACE 10/02/22   James Ghent, MD  White Petrolatum-Mineral Oil (REFRESH P.M. OP) Place 1 drop into both eyes daily as needed (dry eyes).    [provider]    Physical Exam    Vital Signs:  James [name] does not have vital signs available for review today.***  Given telephonic nature of communication, physical exam is limited. AAOx3. NAD. Normal affect.  Speech and respirations are unlabored.  Accessory Clinical Findings    None  Assessment & Plan    1.  Preoperative Cardiovascular Risk Assessment: {Click Here to Calculate RCRI      :0000000  { Click Here to Calculate DASI      :[id number] {Select to add RCRI Risk (<1%=LOW; >/=1%=HIGH) (Optional):21036017}  {Select if HIGH (RCRI >/=1%) Risk (Optional):21036030} Recommendations: {2014 ACC/AHA Perioperative Guidelines  :21036001} Antiplatelet and/or Anticoagulation Recommendations: {Antiplatelet Recommendations                   :21036016} {Anticoagulation Recommendations           :[id number]    The patient was advised that if he develops new symptoms prior to surgery to contact our office to arrange for a follow-up visit, and he verbalized understanding.  (Reminder: Include SBE prophylaxis/Antiplatelet/Anticoag Instructions***)  A copy of this note will be routed to requesting surgeon.  Time:   Today, I have spent *** minutes with the patient with telehealth technology discussing medical history, symptoms, and management plan.     Mable Fill, Marissa Nestle, James [name]  11/05/2022, 1:33 PM

## 2022-11-07 ENCOUNTER — Ambulatory Visit: Payer: Medicare Other | Attending: Cardiology

## 2022-11-07 DIAGNOSIS — Z01810 Encounter for preprocedural cardiovascular examination: Secondary | ICD-10-CM

## 2022-11-13 ENCOUNTER — Telehealth: Payer: Self-pay | Admitting: Orthopedic Surgery

## 2022-11-13 NOTE — Telephone Encounter (Signed)
Patient is scheduled for left total knee at [facility] on 11-27-22.  He is inquiring about walker, commode seat or any other DME he may need to have after the surgery.  He would like to know if he needs to purchase or the hospital will have for him.  Please call patient and let him know ASAP.  Pre-admission is 11-20-22 at 8am.  Can he also be provided with how the physical therapy sessions will work.  Will he have some home sessions and then later outpatient?  Will ice machine (ice therapy) be provided?  Patient's cell is [phone].

## 2022-11-14 NOTE — Telephone Encounter (Signed)
Called and advised pt. He stated understanding  

## 2022-11-19 NOTE — Pre-Procedure Instructions (Signed)
Surgical Instructions    Your procedure is scheduled on November 27, 2022.  Report to [facility] Main Entrance "A" at 5:30 A.M., then check in with the Admitting office.  Call this number if you have problems the morning of surgery:  [phone]  If you have any questions prior to your surgery date call [phone]: Open Monday-Friday 8am-4pm If you experience any cold or flu symptoms such as cough, fever, chills, shortness of breath, etc. between now and your scheduled surgery, please notify us at the above number.     Remember:  Do not eat after midnight the night before your surgery  You may drink clear liquids until 4:30 AM the morning of your surgery.   Clear liquids allowed are: Water, Non-Citrus Juices (without pulp), Carbonated Beverages, Clear Tea, Black Coffee Only (NO MILK, CREAM OR POWDERED CREAMER of any kind), and Gatorade.  Patient Instructions  The night before surgery:  No food after midnight. ONLY clear liquids after midnight  The day of surgery (if you do NOT have diabetes):  Drink ONE (1) Pre-Surgery Clear Ensure by 4:30 AM the morning of surgery. Drink in one sitting. Do not sip.  This drink was given to you during your hospital  pre-op appointment visit.  Nothing else to drink after completing the  Pre-Surgery Clear Ensure.         If you have questions, please contact your surgeon's office.     Take these medicines the morning of surgery with A SIP OF WATER:  dofetilide (TIKOSYN)   doxazosin (CARDURA)   ezetimibe (ZETIA)   finasteride (PROSCAR)   levothyroxine (SYNTHROID)   metoprolol succinate (TOPROL XL)    May take these medicines IF NEEDED:  carboxymethylcellulose (REFRESH PLUS) eye drops   Follow your surgeon's instructions on when to stop apixaban (ELIQUIS).  If no instructions were given by your surgeon then you will need to call the office to get those instructions.    As of today, STOP taking any Aspirin (unless otherwise instructed by  your surgeon) Aleve, Naproxen, Ibuprofen, Motrin, Advil, Goody's, BC's, all herbal medications, fish oil, and all vitamins. This includes your medication: diclofenac Sodium (VOLTAREN) GEL                      Do NOT Smoke (Tobacco/Vaping) for 24 hours prior to your procedure.  If you use a CPAP at night, you may bring your mask/headgear for your overnight stay.   Contacts, glasses, piercing's, hearing aid's, dentures or partials may not be worn into surgery, please bring cases for these belongings.    For patients admitted to the hospital, discharge time will be determined by your treatment team.   Patients discharged the day of surgery will not be allowed to drive home, and someone needs to stay with them for 24 hours.  SURGICAL WAITING ROOM VISITATION Patients having surgery or a procedure may have no more than 2 support people in the waiting area - these visitors may rotate.   Children under the age of 65 must have an adult with them who is not the patient. If the patient needs to stay at the hospital during part of their recovery, the visitor guidelines for inpatient rooms apply. Pre-op nurse will coordinate an appropriate time for 1 support person to accompany patient in pre-op.  This support person may not rotate.   Please refer to the [facility] website for the visitor guidelines for Inpatients (after your surgery is over and you are in  a regular room).   Please follow the instructions on the handout you received at your pre-admission appointment about preparing for your upcoming surgery using the CHG surgical soap. If you have any questions or concerns, please call one of the numbers listed on the first page of this paperwork.    If you received a COVID test during your pre-op visit  it is requested that you wear a mask when out in public, stay away from anyone that may not be feeling well and notify your surgeon if you develop symptoms. If you have been in contact with anyone that  has tested positive in the last 10 days please notify you surgeon.

## 2022-11-20 ENCOUNTER — Encounter (HOSPITAL_COMMUNITY)
Admission: RE | Admit: 2022-11-20 | Discharge: 2022-11-20 | Disposition: A | Discharge status: Home / Self Care | Payer: Medicare Other | Source: Ambulatory Visit | Attending: Orthopedic Surgery | Admitting: Orthopedic Surgery

## 2022-11-20 ENCOUNTER — Other Ambulatory Visit: Payer: Self-pay

## 2022-11-20 ENCOUNTER — Encounter: Payer: Self-pay | Admitting: Internal Medicine

## 2022-11-20 ENCOUNTER — Encounter (HOSPITAL_COMMUNITY): Payer: Self-pay

## 2022-11-20 VITALS — BP 165/80 | HR 50 | Temp 97.8°F | Resp 18 | Ht 72.0 in | Wt 259.0 lb

## 2022-11-20 DIAGNOSIS — Z01818 Encounter for other preprocedural examination: Secondary | ICD-10-CM | POA: Diagnosis not present

## 2022-11-20 DIAGNOSIS — Z789 Other specified health status: Secondary | ICD-10-CM | POA: Insufficient documentation

## 2022-11-20 HISTORY — DX: Cardiac arrhythmia, unspecified: I49.9

## 2022-11-20 LAB — URINALYSIS, COMPLETE (UACMP) WITH MICROSCOPIC
Bacteria, UA: NONE SEEN
Glucose, UA: NEGATIVE mg/dL
Hgb urine dipstick: NEGATIVE
Ketones, ur: 5 mg/dL — AB
Leukocytes,Ua: NEGATIVE
Nitrite: NEGATIVE
Protein, ur: 30 mg/dL — AB
Specific Gravity, Urine: 1.033 — ABNORMAL HIGH (ref 1.005–1.030)
pH: 5 (ref 5.0–8.0)

## 2022-11-20 LAB — COMPREHENSIVE METABOLIC PANEL
ALT: 12 U/L (ref 0–44)
AST: 17 U/L (ref 15–41)
Albumin: 3.5 g/dL (ref 3.5–5.0)
Alkaline Phosphatase: 58 U/L (ref 38–126)
Anion gap: 8 (ref 5–15)
BUN: 23 mg/dL (ref 8–23)
CO2: 27 mmol/L (ref 22–32)
Calcium: 9.1 mg/dL (ref 8.9–10.3)
Chloride: 105 mmol/L (ref 98–111)
Creatinine, Ser: 1.42 mg/dL — ABNORMAL HIGH (ref 0.61–1.24)
GFR, Estimated: 52 mL/min — ABNORMAL LOW (ref >60)
Glucose, Bld: 87 mg/dL (ref 70–99)
Potassium: 4.1 mmol/L (ref 3.5–5.1)
Sodium: 140 mmol/L (ref 135–145)
Total Bilirubin: 1.3 mg/dL — ABNORMAL HIGH (ref 0.3–1.2)
Total Protein: 6 g/dL — ABNORMAL LOW (ref 6.5–8.1)

## 2022-11-20 LAB — CBC
HCT: 42.5 % (ref 39.0–52.0)
Hemoglobin: 14.5 g/dL (ref 13.0–17.0)
MCH: 31.9 pg (ref 26.0–34.0)
MCHC: 34.1 g/dL (ref 30.0–36.0)
MCV: 93.4 fL (ref 80.0–100.0)
Platelets: 144 10*3/uL — ABNORMAL LOW (ref 150–400)
RBC: 4.55 MIL/uL (ref 4.22–5.81)
RDW: 13.2 % (ref 11.5–15.5)
WBC: 9.6 10*3/uL (ref 4.0–10.5)
nRBC: 0 % (ref 0.0–0.2)

## 2022-11-20 LAB — SURGICAL PCR SCREEN
MRSA, PCR: NEGATIVE
Staphylococcus aureus: NEGATIVE

## 2022-11-20 NOTE — Progress Notes (Signed)
PCP - Dr. Dwana Curd. Para March Cardiologist - Dr. Cristal Deer End  PPM/ICD - Denies Device Orders - n/a Rep Notified - n/a  Chest x-ray - n/a EKG - 08/24/2022 Stress Test - 09/09/2020 (CE) ECHO - 06/19/2019 Cardiac Cath - 05/13/2018 with one stent placed  Sleep Study - Denies CPAP - n/a  No DM  Last dose of GLP1 agonist- n/a   GLP1 instructions: n/a  Blood Thinner Instructions: Per pt, he was instructed to stop Eliquis 3 days prior to surgery. His last dose will be evening of April 19th Aspirin Instructions: n/a  ERAS Protcol - Clear liquids until 0430 morning of surgery PRE-SURGERY Ensure or G2-  Ensure given to pt with instructions  COVID TEST- n/a   Anesthesia review: Yes. Cardiac Clearance  Patient denies shortness of breath, fever, cough and chest pain at PAT appointment. Pt denies any respiratory illness/infection in the last two months.   All instructions explained to the patient, with a verbal understanding of the material. Patient agrees to go over the instructions while at home for a better understanding. Patient also instructed to self quarantine after being tested for COVID-19. The opportunity to ask questions was provided.

## 2022-11-21 ENCOUNTER — Encounter (HOSPITAL_COMMUNITY): Payer: Self-pay | Admitting: Medical

## 2022-11-21 DIAGNOSIS — D0472 Carcinoma in situ of skin of left lower limb, including hip: Secondary | ICD-10-CM | POA: Diagnosis not present

## 2022-11-21 DIAGNOSIS — X32XXXA Exposure to sunlight, initial encounter: Secondary | ICD-10-CM | POA: Diagnosis not present

## 2022-11-21 DIAGNOSIS — Z08 Encounter for follow-up examination after completed treatment for malignant neoplasm: Secondary | ICD-10-CM | POA: Diagnosis not present

## 2022-11-21 DIAGNOSIS — D485 Neoplasm of uncertain behavior of skin: Secondary | ICD-10-CM | POA: Diagnosis not present

## 2022-11-21 DIAGNOSIS — Z85820 Personal history of malignant melanoma of skin: Secondary | ICD-10-CM | POA: Diagnosis not present

## 2022-11-21 DIAGNOSIS — D2372 Other benign neoplasm of skin of left lower limb, including hip: Secondary | ICD-10-CM | POA: Diagnosis not present

## 2022-11-21 DIAGNOSIS — Z85828 Personal history of other malignant neoplasm of skin: Secondary | ICD-10-CM | POA: Diagnosis not present

## 2022-11-21 DIAGNOSIS — L570 Actinic keratosis: Secondary | ICD-10-CM | POA: Diagnosis not present

## 2022-11-21 DIAGNOSIS — L820 Inflamed seborrheic keratosis: Secondary | ICD-10-CM | POA: Diagnosis not present

## 2022-11-21 NOTE — Progress Notes (Signed)
Case: [id number] Date/Time: 11/27/22 0715   Procedure: LEFT TOTAL KNEE ARTHROPLASTY (Left: Knee)   Anesthesia type: Spinal   Pre-op diagnosis: left knee osteoarthritis   [facility] / [facility]   Surgeons: James Copa, MD       DISCUSSION: James [name] is a 77 year old male who presents to PAT clinic. Patient has had chronic left knee pain for years and is now posted for a left TKA with Dr. August [name]. Past medical history significant for CAD status post PCI (2019), atrial fibrillation on Eliquis, thoracic aortic aneurysm, CHF, hypertension, obesity, former smoker (quit 1985).  No prior anesthesia complications.  ASA 3.  #TAA -Last evaluated 06/02/21 by Cardiology. Needs f/u CTA prior to surgery  #CAD s/p PCI (2019) #A.fib on Eliquis #CHF #HTN -Follows with cardiology and was last seen on 08/24/2022.  Was stable at that visit and appeared euvolemic. Advised to follow-up in 1 year. -Blood pressure has been fairly well-controlled although elevated at PAT visit.   -A.fib has been rate/rhythm controlled -Per pt, he was instructed to stop Eliquis 3 days prior to surgery. His last dose will be evening of April 19th   #Preoperative Cardiovascular Risk Assessment (note from 11/07/22):   James [name] perioperative risk of a major cardiac event is 0.9% according to the Revised Cardiac Risk Index (RCRI).  Therefore, he is at high risk for perioperative complications.   His functional capacity is good at 4.4 METs according to the [facility] Activity Status Index (DASI). Recommendations: According to ACC/AHA guidelines, no further cardiovascular testing needed.  The patient may proceed to surgery at acceptable risk.   Antiplatelet and/or Anticoagulation Recommendations:   Eliquis (Apixaban) can be held for 3 days prior to surgery.  Please resume post op when felt to be safe.     VS: BP (!) 165/80   Pulse (!) 50   Temp 36.6 C   Resp 18   Ht 6' (1.829 m)   Wt 117.5 kg   SpO2 99%   BMI 35.13  kg/m   PROVIDERS: PCP - Dr. Dwana [name]. James [name] Cardiologist - Dr. Cristal Deer [name]   LABS: Labs reviewed: Acceptable for surgery. (all labs ordered are listed, but only abnormal results are displayed)  Labs Reviewed  CBC - Abnormal; Notable for the following components:      Result Value   Platelets 144 (*)    All other components within normal limits  URINALYSIS, COMPLETE (UACMP) WITH MICROSCOPIC - Abnormal; Notable for the following components:   Color, Urine AMBER (*)    APPearance HAZY (*)    Specific Gravity, Urine 1.033 (*)    Bilirubin Urine MODERATE (*)    Ketones, ur 5 (*)    Protein, ur 30 (*)    All other components within normal limits  COMPREHENSIVE METABOLIC PANEL - Abnormal; Notable for the following components:   Creatinine, Ser 1.42 (*)    Total Protein 6.0 (*)    Total Bilirubin 1.3 (*)    GFR, Estimated 52 (*)    All other components within normal limits  SURGICAL PCR SCREEN     IMAGES:  CTA 06/02/21:  IMPRESSION: 1. 4.7 cm ascending aorta and 4.3 cm arch aneurysm, without complicating features. Recommend semi-annual imaging followup by CTA or MRA and referral to cardiothoracic surgery if not already obtained. This recommendation follows 2010 ACCF/AHA/AATS/ACR/ASA/SCA/SCAI/SIR/STS/SVM Guidelines for the Diagnosis and Management of Patients With Thoracic Aortic Disease. Circulation. 2010; 121: e266-e36 2. Coronary calcifications. The severity of coronary artery disease  and any potential stenosis cannot be assessed on this non-gated CT examination. Assessment for potential risk factor modification, dietary therapy or pharmacologic therapy may be warranted, if clinically indicated. And Aortic Atherosclerosis (ICD10-170.0).     EKG 08/24/2022  NSR LAFB No significant change from prio  CV:  Stress Test - 09/09/2020:  Pharmacological myocardial perfusion imaging study with no significant ischemia Small fixed apical defect consistent with prior  MI, though attenuation artifact cannot be excluded. Normal wall motion, EF estimated at 57% No EKG changes concerning for ischemia at peak stress or in recovery. CT attenuation correction images with mild aortic atherosclerosis, moderate three-vessel coronary calcification Low risk scan  ECHO - 06/19/2019:  IMPRESSIONS     1. Left ventricular ejection fraction, by visual estimation, is 55 to  60%. The left ventricle has normal function. There is no left ventricular  hypertrophy.   2. Global right ventricle has normal systolic function.The right  ventricular size is normal. No increase in right ventricular wall  thickness.   3. Left atrial size was mild-moderately dilated.   4. Right atrial size was normal.   5. Mild to moderate mitral valve regurgitation.   6. There is mild dilatation of the aortic root measuring 38 mm. Aortic  arch 3.8 cm   7. Mildly elevated pulmonary artery systolic pressure.   Cardiac Cath - 05/13/2018   Conclusions: Significant proximal/mid RCA disease with sequential 30% and 80-90% stenoses. Successful PCI to mid RCA stenosis using Xience Sierra 3.5 x 18 mm drug-eluting stent (post-dilated to 4.0 mm) with 0% residual stenosis and TIMI-3 flow.   Recommendations: Aggressive secondary prevention. Restart heparin infusion 2 hours after TR band removal. Anticipate discharge home tomorrow if no post-catheterization complications.   Recommend to resume Apixaban, at currently prescribed dose and frequency, on 05/14/18 if there is no evidence of significant bleeding or vascular injury.  Recommend concurrent antiplatelet therapy of Clopidogrel 75mg  daily for 12 months.  Past Medical History:  Diagnosis Date   (HFpEF) heart failure with preserved ejection fraction    a. 05/2018 Echo: EF 55-60%, gr2 DD.   Acute lower GI bleeding after colonoscopy and polypectomy, resolved 12/12/2015   Ascending aortic aneurysm    a. 02/2018 Echo: mildly dil Ao root/asc ao/arch; b.  05/2018 CTA Chest: 4.7cm fusiform aneurysm of Asc Ao.   CAD (coronary artery disease)    a. 05/2018 Cath/PCI: LM nl,LAD 30p, 90/99d/apical, LCX 65m, OM1/2/3 min irregs, RCA 85p (3.5x18 [place] DES).   Cardiac murmur    a. 02/2018 Echo: EF 60-65%, no rwma, mild AI, mildly dil Ao root/Asc Ao/Arch, Mild MR, mildly dil LA. Nl RV fxn.   Cataract    bilateral surgery to remove   CHF (congestive heart failure)    Colon polyp    Constipation    Dysrhythmia    Hx A. Fib   Essential hypertension    Hemorrhoids    Hepatitis    self resolved, likely food exposure, 1974.    History of cardiovascular stress test    a. 02/2018 Myoview: EF 55-65%, small/mild apical defect w/ nl wall motion ->attenuation artifact. No ischemia. Low risk study.   Hx of adenomatous colonic polyps 12/05/2015   Hyperlipidemia    Hypothyroidism    Mitral regurgitation    a. 110/2019 Echo: EF 55-60%. Gr2 DD. Triv AI. Ao root 38mm. Mild MR. Mod dil LA, mildly dil RA.   Osteoarthritis    Persistent atrial fibrillation    a. Dx 02/2018. CHA2DS2VASc = 2-->Eliquis; b.  03/2018 DCCV (200J); c. 03/2018 recurrent Afib-->flecainide started 04/2018;  d. 05/06/2018 s/p successful DCCV - 200J x 2-->on amio.   Skin cancer    basal cell, R ear, MOHS    Past Surgical History:  Procedure Laterality Date   BROW LIFT Bilateral 10/23/2016   Procedure: BLEPHAROPLASTY upper eyelid with excess skin;  Surgeon: Imagene Riches, MD;  Location: [facility];  Service: Ophthalmology;  Laterality: Bilateral;  MAC   CARDIOVERSION N/A 03/18/2018   Procedure: CARDIOVERSION;  Surgeon: Yvonne Kendall, MD;  Location: [facility];  Service: Cardiovascular;  Laterality: N/A;   CARDIOVERSION N/A 05/06/2018   Procedure: CARDIOVERSION;  Surgeon: Yvonne Kendall, MD;  Location: [facility];  Service: Cardiovascular;  Laterality: N/A;   CATARACT EXTRACTION  1986   OD   CATARACT EXTRACTION Bilateral 1995   x 2 for right and left    COLONOSCOPY     CORONARY STENT  INTERVENTION N/A 05/13/2018   Procedure: CORONARY STENT INTERVENTION;  Surgeon: Yvonne Kendall, MD;  Location: [facility];  Service: Cardiovascular;  Laterality: N/A;   ECTROPION REPAIR Bilateral 10/23/2016   Procedure: REPAIR OF ECTROPION sutures, extensive;  Surgeon: Imagene Riches, MD;  Location: [facility];  Service: Ophthalmology;  Laterality: Bilateral;   LIPOMA EXCISION  06/1997   left neck (Juengel)   MOHS SURGERY Right 2013   behind right ear   RIGHT/LEFT HEART CATH AND CORONARY ANGIOGRAPHY N/A 05/12/2018   Procedure: RIGHT/LEFT HEART CATH AND CORONARY ANGIOGRAPHY;  Surgeon: Iran Ouch, MD;  Location: [facility];  Service: Cardiovascular;  Laterality: N/A;   TONSILLECTOMY     VASECTOMY  1982    MEDICATIONS:  apixaban (ELIQUIS) 5 MG TABS tablet   atorvastatin (LIPITOR) 80 MG tablet   carboxymethylcellulose (REFRESH PLUS) 0.5 % SOLN   diclofenac Sodium (VOLTAREN) 1 % GEL   dofetilide (TIKOSYN) 500 MCG capsule   doxazosin (CARDURA) 4 MG tablet   ezetimibe (ZETIA) 10 MG tablet   finasteride (PROSCAR) 5 MG tablet   furosemide (LASIX) 20 MG tablet   ibuprofen (ADVIL) 200 MG tablet   levothyroxine (SYNTHROID) 125 MCG tablet   metoprolol succinate (TOPROL XL) 25 MG 24 hr tablet   Multiple Vitamin (MULTIVITAMIN WITH MINERALS) TABS tablet   psyllium (METAMUCIL) 58.6 % packet   ramipril (ALTACE) 10 MG capsule   White Petrolatum-Mineral Oil (REFRESH P.M. OP)   No current facility-administered medications for this encounter.    Marcille Blanco [facility] Surgical Short Stay/Anesthesiology [facility] Phone [phone] 11/21/2022 4:56 PM

## 2022-11-22 ENCOUNTER — Telehealth: Payer: Self-pay

## 2022-11-22 ENCOUNTER — Other Ambulatory Visit: Payer: Self-pay

## 2022-11-22 DIAGNOSIS — I7121 Aneurysm of the ascending aorta, without rupture: Secondary | ICD-10-CM

## 2022-11-22 NOTE — Telephone Encounter (Signed)
I will forward this to pre op pool for review if the pt ha been cleared yet or not. I have read the notes from Robin Searing, NP:  Preoperative Cardiovascular Risk Assessment:   Mr. Bollig perioperative risk of a major cardiac event is 0.9% according to the Revised Cardiac Risk Index (RCRI).  Therefore, he is at high risk for perioperative complications.   His functional capacity is good at 4.4 METs according to the Duke Activity Status Index (DASI). Recommendations: According to ACC/AHA guidelines, no further cardiovascular testing needed.  The patient may proceed to surgery at acceptable risk.   Antiplatelet and/or Anticoagulation Recommendations:   Eliquis (Apixaban) can be held for 3 days prior to surgery.  Please resume post op when felt to be safe.       The patient was advised that if he develops new symptoms prior to surgery to contact our office to arrange for a follow-up visit, and he verbalized understanding.  NP  notes seem to reflect that he has cleared the pt as well as has given recommendations for Eliquis.

## 2022-11-22 NOTE — Telephone Encounter (Signed)
[facility] is calling stating James [name], CMA told them they need to speak to someone in the [place] office because he is supposed to have a CT done before his surgery.   She states pt is cleared but the clearance says if pt has developed symptoms to call the office. She is unaware that the pt is having symptoms and why the CT ordered. She would like a c/b with some clarification because she will need to r/s the surgery if Ct cannot be done in time.

## 2022-11-22 NOTE — Telephone Encounter (Signed)
Patient notified that an order has been put ion the chart for him to have a repeat CT of the Aorta. Patient advised that his surgery may need to be rescheduled until after his imaging. Patient voiced understanding.   Received a call from Omer Jack at [facility] seeking information about clearance for patients upcoming surgery. I advised her to contact the [facility] office and speak with Dr End. She states she requested that but was transferred to me. Advised her to contact the [place] office for clarification. She voiced understanding.

## 2022-11-23 NOTE — Telephone Encounter (Signed)
Patient is awaiting CTA of aorta for monitoring of thoracic aortic dilatation. Surgery will need to be postponed until CT is completed.  Levi Aland, NP-C  11/23/2022, 9:55 AM [address] Office [phone] Fax [phone]

## 2022-11-24 ENCOUNTER — Encounter: Payer: Self-pay | Admitting: Orthopedic Surgery

## 2022-11-26 ENCOUNTER — Ambulatory Visit (HOSPITAL_COMMUNITY): Payer: Medicare Other | Admitting: Physician Assistant

## 2022-11-26 MED ORDER — TRANEXAMIC ACID 1000 MG/10ML IV SOLN
2000.0000 mg | INTRAVENOUS | Status: DC
  Filled 2022-11-26: qty 20

## 2022-11-27 ENCOUNTER — Encounter (HOSPITAL_COMMUNITY): Admission: RE | Payer: Self-pay | Source: Home / Self Care

## 2022-11-27 ENCOUNTER — Ambulatory Visit (HOSPITAL_COMMUNITY): Admission: RE | Admit: 2022-11-27 | Payer: Medicare Other | Source: Home / Self Care | Admitting: Orthopedic Surgery

## 2022-11-27 DIAGNOSIS — Z01818 Encounter for other preprocedural examination: Secondary | ICD-10-CM

## 2022-11-27 SURGERY — ARTHROPLASTY, KNEE, TOTAL
Anesthesia: Spinal | Site: Knee | Laterality: Left

## 2022-11-29 DIAGNOSIS — D0472 Carcinoma in situ of skin of left lower limb, including hip: Secondary | ICD-10-CM | POA: Diagnosis not present

## 2022-12-03 ENCOUNTER — Other Ambulatory Visit: Payer: Self-pay | Admitting: Family Medicine

## 2022-12-06 ENCOUNTER — Ambulatory Visit
Admission: RE | Admit: 2022-12-06 | Discharge: 2022-12-06 | Disposition: A | Discharge status: Home / Self Care | Payer: Medicare Other | Source: Ambulatory Visit | Attending: Internal Medicine | Admitting: Internal Medicine

## 2022-12-06 DIAGNOSIS — I7121 Aneurysm of the ascending aorta, without rupture: Secondary | ICD-10-CM | POA: Diagnosis not present

## 2022-12-06 DIAGNOSIS — I2510 Atherosclerotic heart disease of native coronary artery without angina pectoris: Secondary | ICD-10-CM | POA: Diagnosis not present

## 2022-12-06 MED ORDER — IOHEXOL 350 MG/ML SOLN
75.0000 mL | Freq: Once | INTRAVENOUS | Status: AC | PRN
  Administered 2022-12-06: 75 mL via INTRAVENOUS

## 2022-12-11 ENCOUNTER — Encounter: Payer: Self-pay | Admitting: Internal Medicine

## 2022-12-12 ENCOUNTER — Encounter: Payer: Medicare Other | Admitting: Surgical

## 2022-12-12 ENCOUNTER — Telehealth: Payer: Self-pay | Admitting: Internal Medicine

## 2022-12-12 NOTE — Progress Notes (Signed)
Hi [name] I called Tom and he would like to get his left knee replaced.  I have done the blue sheet.  Thanks

## 2022-12-12 NOTE — Progress Notes (Signed)
Thanks for forwarding James [name].  I will set him up for surgery in the near future.

## 2022-12-12 NOTE — Telephone Encounter (Signed)
See previous encounters.  James [name] with Dr. Lorin Picket [facility]'s office is following up. Their office needs to clarify whether CT results interfere with patient holding blood thinner 3 days prior. Please advise.  Phone#: [phone] FAX#: [phone]

## 2022-12-13 ENCOUNTER — Other Ambulatory Visit: Payer: Self-pay

## 2022-12-13 NOTE — Telephone Encounter (Signed)
I will forward to pre op APP to review.

## 2022-12-13 NOTE — Telephone Encounter (Signed)
   Patient Name: James [name]  DOB: 13-Aug-1945 MRN: [id number]  Primary Cardiologist: Yvonne Kendall, MD  Chart reviewed as part of pre-operative protocol coverage. Pre-op clearance already addressed by colleagues in earlier phone notes. To summarize recommendations:  -Please let Mr. Laplante know that his ascending aortic aneurysm is stable at 4.5 cm.  He should continue his current medications.  Given stability since 05/2021, we can defer repeat imaging for at least 2 years.  He can proceed with elective orthopedic surgery without additional cardiac testing or intervention.  Fluoroquinolone antibiotics should be avoided, as they have been associated with development/progression of aortic disease.  I will forward this message to our preop team and Dr. August Saucer for their review as well.  -Dr. Okey Dupre  Will route this bundled recommendation to requesting provider via Epic fax function and remove from pre-op pool. Please call with questions.  Sharlene Dory, PA-C 12/13/2022, 3:38 PM

## 2023-01-08 NOTE — Pre-Procedure Instructions (Signed)
Surgical Instructions   Your procedure is scheduled on Thursday, June 13th. Report to [facility] Main Entrance "A" at 05:30 A.M., then check in with the Admitting office. Any questions or running late day of surgery: call [phone]  Questions prior to your surgery date: call [phone], Monday-Friday, 8am-4pm. If you experience any cold or flu symptoms such as cough, fever, chills, shortness of breath, etc. between now and your scheduled surgery, please notify us at the above number.     Remember:  Do not eat after midnight the night before your surgery  You may drink clear liquids until 04:30 AM the morning of your surgery.   Clear liquids allowed are: Water, Non-Citrus Juices (without pulp), Carbonated Beverages, Clear Tea, Black Coffee Only (NO MILK, CREAM OR POWDERED CREAMER of any kind), and Gatorade.   Patient Instructions  The night before surgery:  No food after midnight. ONLY clear liquids after midnight  The day of surgery (if you do NOT have diabetes):  Drink ONE (1) Pre-Surgery Clear Ensure by 04:30 AM the morning of surgery. Drink in one sitting. Do not sip.  This drink was given to you during your hospital  pre-op appointment visit.  Nothing else to drink after completing the  Pre-Surgery Clear Ensure.          If you have questions, please contact your surgeon's office.     Take these medicines the morning of surgery with A SIP OF WATER  dofetilide (TIKOSYN)  doxazosin (CARDURA)  ezetimibe (ZETIA)  finasteride (PROSCAR)  levothyroxine (SYNTHROID)  metoprolol succinate (TOPROL XL)      May take these medicines IF NEEDED: carboxymethylcellulose (REFRESH PLUS) eye drops    Follow your surgeon's instructions on when to stop Eliquis.  If no instructions were given by your surgeon then you will need to call the office to get those instructions.     One week prior to surgery, STOP taking any Aspirin (unless otherwise instructed by your surgeon) Aleve,  Naproxen, Ibuprofen, Motrin, diclofenac Sodium (VOLTAREN) gel, Advil, Goody's, BC's, all herbal medications, fish oil, and non-prescription vitamins.                     Do NOT Smoke (Tobacco/Vaping) for 24 hours prior to your procedure.  If you use a CPAP at night, you may bring your mask/headgear for your overnight stay.   You will be asked to remove any contacts, glasses, piercing's, hearing aid's, dentures/partials prior to surgery. Please bring cases for these items if needed.    Patients discharged the day of surgery will not be allowed to drive home, and someone needs to stay with them for 24 hours.  SURGICAL WAITING ROOM VISITATION Patients may have no more than 2 support people in the waiting area - these visitors may rotate.   Pre-op nurse will coordinate an appropriate time for 1 ADULT support person, who may not rotate, to accompany patient in pre-op.  Children under the age of 22 must have an adult with them who is not the patient and must remain in the main waiting area with an adult.  If the patient needs to stay at the hospital during part of their recovery, the visitor guidelines for inpatient rooms apply.  Please refer to the [facility] website for the visitor guidelines for any additional information.   If you received a COVID test during your pre-op visit  it is requested that you wear a mask when out in public, stay away from anyone  that may not be feeling well and notify your surgeon if you develop symptoms. If you have been in contact with anyone that has tested positive in the last 10 days please notify you surgeon.      Pre-operative 5 CHG Bath Instructions   You can play a key role in reducing the risk of infection after surgery. Your skin needs to be as free of germs as possible. You can reduce the number of germs on your skin by washing with CHG (chlorhexidine gluconate) soap before surgery. CHG is an antiseptic soap that kills germs and continues to kill  germs even after washing.   DO NOT use if you have an allergy to chlorhexidine/CHG or antibacterial soaps. If your skin becomes reddened or irritated, stop using the CHG and notify one of our RNs at [phone].   Please shower with the CHG soap starting 4 days before surgery using the following schedule:     Please keep in mind the following:  DO NOT shave, including legs and underarms, starting the day of your first shower.   You may shave your face at any point before/day of surgery.  Place clean sheets on your bed the day you start using CHG soap. Use a clean washcloth (not used since being washed) for each shower. DO NOT sleep with pets once you start using the CHG.   CHG Shower Instructions:  If you choose to wash your hair and private area, wash first with your normal shampoo/soap.  After you use shampoo/soap, rinse your hair and body thoroughly to remove shampoo/soap residue.  Turn the water OFF and apply about 3 tablespoons (45 ml) of CHG soap to a CLEAN washcloth.  Apply CHG soap ONLY FROM YOUR NECK DOWN TO YOUR TOES (washing for 3-5 minutes)  DO NOT use CHG soap on face, private areas, open wounds, or sores.  Pay special attention to the area where your surgery is being performed.  If you are having back surgery, having someone wash your back for you may be helpful. Wait 2 minutes after CHG soap is applied, then you may rinse off the CHG soap.  Pat dry with a clean towel  Put on clean clothes/pajamas   If you choose to wear lotion, please use ONLY the CHG-compatible lotions on the back of this paper.   Additional instructions for the day of surgery: DO NOT APPLY any lotions, deodorants, cologne, or perfumes.   Do not bring valuables to the hospital. [facility] is not responsible for any belongings/valuables. Do not wear nail polish, gel polish, artificial nails, or any other type of covering on natural nails (fingers and toes) Do not wear jewelry or makeup Put on  clean/comfortable clothes.  Please brush your teeth.  Ask your nurse before applying any prescription medications to the skin.     CHG Compatible Lotions   Aveeno Moisturizing lotion  Cetaphil Moisturizing Cream  Cetaphil Moisturizing Lotion  Clairol Herbal Essence Moisturizing Lotion, Dry Skin  Clairol Herbal Essence Moisturizing Lotion, Extra Dry Skin  Clairol Herbal Essence Moisturizing Lotion, Normal Skin  Curel Age Defying Therapeutic Moisturizing Lotion with Alpha Hydroxy  Curel Extreme Care Body Lotion  Curel Soothing Hands Moisturizing Hand Lotion  Curel Therapeutic Moisturizing Cream, Fragrance-Free  Curel Therapeutic Moisturizing Lotion, Fragrance-Free  Curel Therapeutic Moisturizing Lotion, Original Formula  Eucerin Daily Replenishing Lotion  Eucerin Dry Skin Therapy Plus Alpha Hydroxy Crme  Eucerin Dry Skin Therapy Plus Alpha Hydroxy Lotion  Eucerin Original Crme  Eucerin Original Lotion  Eucerin Plus Crme Eucerin Plus Lotion  Eucerin TriLipid Replenishing Lotion  Keri Anti-Bacterial Hand Lotion  Keri Deep Conditioning Original Lotion Dry Skin Formula Softly Scented  Keri Deep Conditioning Original Lotion, Fragrance Free Sensitive Skin Formula  Keri Lotion Fast Absorbing Fragrance Free Sensitive Skin Formula  Keri Lotion Fast Absorbing Softly Scented Dry Skin Formula  Keri Original Lotion  Keri Skin Renewal Lotion Keri Silky Smooth Lotion  Keri Silky Smooth Sensitive Skin Lotion  Nivea Body Creamy Conditioning Oil  Nivea Body Extra Enriched Lotion  Nivea Body Original Lotion  Nivea Body Sheer Moisturizing Lotion Nivea Crme  Nivea Skin Firming Lotion  NutraDerm 30 Skin Lotion  NutraDerm Skin Lotion  NutraDerm Therapeutic Skin Cream  NutraDerm Therapeutic Skin Lotion  ProShield Protective Hand Cream  Provon moisturizing lotion  Please read over the following fact sheets that you were given.

## 2023-01-08 NOTE — Progress Notes (Signed)
     Your surgery and Pre-Admission testing visit will be at [facility] located at [address], [place], [place] [address].  Please let all your doctors (i.e., Primary Care Physician, Cardiologist, Endocrinologist, Pulmonologist) know you are having surgery. You may need clearance for surgery. If you are on blood thinners, notify your surgeon and ask the doctor who prescribed them how long to hold them before surgery.  If you have had a heart test, such as an EKG, stress test, heart ultrasound, etc., or lab work performed outside of [facility], please bring copies of these tests to your Pre-Admission testing, if possible.  These departments may contact you before the day of surgery:  Pre-Service Center - insurance/ billing: [phone] Pharmacy- to review your medications: [phone] Pre-Admission Testing- to set an appointment for your visit: [phone]  (Often, these numbers show up as "SPAM" on your phone)  The Pre-Admission Testing (PAT) visit focuses on Anesthesia for your upcoming surgery.  You do NOT need to fast; take your medications as usual. Please arrive 30 minutes early to allow for parking and admitting.  The visit may last up to an hour. Bring a photo ID and medical insurance card. Reschedule if you are sick. ([phone]) and please, NO children under age 16 at the visit.  During the PAT visit:  We will review your medical and surgical history.   You will receive pre-operative instructions, including the time of arrival at the hospital and surgical start time.  We will review what medication(s) you can take on the day of surgery.  After speaking with the nurse, you will have blood drawn and, if needed, a chest x-ray and EKG.  Most lab results from your doctor are good for 30 days, Hemoglobin A1C is good for 60 days. If you cannot talk to the Pharmacy, bring your medications or a list of them to the PST visit.   Infection control for the [facility] requires: All fingernail and toenail products should be removed before the day of surgery.  (SNS, Acrylic, Gel, Polish, Stickers, Press on, and Poly gel nails.)   Parking information:  Address: [facility] - [address], [place], [place] [address]  Please look for signs for entrance A off of [address]. Free valet parking is available Monday-Friday 05:30am-06:00pm     

## 2023-01-09 ENCOUNTER — Other Ambulatory Visit: Payer: Self-pay

## 2023-01-09 ENCOUNTER — Encounter (HOSPITAL_COMMUNITY)
Admission: RE | Admit: 2023-01-09 | Discharge: 2023-01-09 | Disposition: A | Discharge status: Home / Self Care | Payer: Medicare Other | Source: Ambulatory Visit | Attending: Orthopedic Surgery | Admitting: Orthopedic Surgery

## 2023-01-09 ENCOUNTER — Encounter (HOSPITAL_COMMUNITY): Payer: Self-pay

## 2023-01-09 VITALS — BP 149/73 | HR 57 | Temp 98.1°F | Resp 18 | Ht 73.0 in | Wt 259.2 lb

## 2023-01-09 DIAGNOSIS — Z8619 Personal history of other infectious and parasitic diseases: Secondary | ICD-10-CM | POA: Diagnosis not present

## 2023-01-09 DIAGNOSIS — Z01812 Encounter for preprocedural laboratory examination: Secondary | ICD-10-CM | POA: Diagnosis not present

## 2023-01-09 DIAGNOSIS — Z01818 Encounter for other preprocedural examination: Secondary | ICD-10-CM

## 2023-01-09 DIAGNOSIS — R739 Hyperglycemia, unspecified: Secondary | ICD-10-CM

## 2023-01-09 LAB — CBC
HCT: 41.6 % (ref 39.0–52.0)
Hemoglobin: 13.9 g/dL (ref 13.0–17.0)
MCH: 31.4 pg (ref 26.0–34.0)
MCHC: 33.4 g/dL (ref 30.0–36.0)
MCV: 93.9 fL (ref 80.0–100.0)
Platelets: 163 10*3/uL (ref 150–400)
RBC: 4.43 MIL/uL (ref 4.22–5.81)
RDW: 13.1 % (ref 11.5–15.5)
WBC: 7.9 10*3/uL (ref 4.0–10.5)
nRBC: 0 % (ref 0.0–0.2)

## 2023-01-09 LAB — COMPREHENSIVE METABOLIC PANEL
ALT: 14 U/L (ref 0–44)
AST: 18 U/L (ref 15–41)
Albumin: 3.2 g/dL — ABNORMAL LOW (ref 3.5–5.0)
Alkaline Phosphatase: 60 U/L (ref 38–126)
Anion gap: 9 (ref 5–15)
BUN: 18 mg/dL (ref 8–23)
CO2: 26 mmol/L (ref 22–32)
Calcium: 9 mg/dL (ref 8.9–10.3)
Chloride: 102 mmol/L (ref 98–111)
Creatinine, Ser: 1.32 mg/dL — ABNORMAL HIGH (ref 0.61–1.24)
GFR, Estimated: 56 mL/min — ABNORMAL LOW (ref >60)
Glucose, Bld: 114 mg/dL — ABNORMAL HIGH (ref 70–99)
Potassium: 4.4 mmol/L (ref 3.5–5.1)
Sodium: 137 mmol/L (ref 135–145)
Total Bilirubin: 1 mg/dL (ref 0.3–1.2)
Total Protein: 6 g/dL — ABNORMAL LOW (ref 6.5–8.1)

## 2023-01-09 LAB — SURGICAL PCR SCREEN
MRSA, PCR: NEGATIVE
Staphylococcus aureus: NEGATIVE

## 2023-01-09 LAB — URINALYSIS, COMPLETE (UACMP) WITH MICROSCOPIC
Bacteria, UA: NONE SEEN
Glucose, UA: NEGATIVE mg/dL
Hgb urine dipstick: NEGATIVE
Ketones, ur: 5 mg/dL — AB
Leukocytes,Ua: NEGATIVE
Nitrite: NEGATIVE
Protein, ur: NEGATIVE mg/dL
Specific Gravity, Urine: 1.034 — ABNORMAL HIGH (ref 1.005–1.030)
pH: 5 (ref 5.0–8.0)

## 2023-01-09 NOTE — Progress Notes (Signed)
PCP - Dr. Crawford Givens Cardiologist - Dr. Cristal Deer End  PPM/ICD - denies   Chest x-ray - 07/08/21 EKG - 08/24/22 Stress Test - 09/09/20 ECHO - 06/19/19 Cardiac Cath - 05/13/18  Sleep Study - denies   DM- denies  Blood Thinner Instructions: Hold Eliquis 3 days. Last dose 6/9. Aspirin Instructions: n/a  ERAS Protcol - yes PRE-SURGERY Ensure given at PAT  COVID TEST- n/a   Anesthesia review: yes, cardiac hx. Pt was reviewed recently. Previous surgery cancelled due to further cardiac testing needed.  Patient denies shortness of breath, fever, cough and chest pain at PAT appointment   All instructions explained to the patient, with a verbal understanding of the material. Patient agrees to go over the instructions while at home for a better understanding.  The opportunity to ask questions was provided.

## 2023-01-10 NOTE — Anesthesia Preprocedure Evaluation (Addendum)
Anesthesia Evaluation  Patient identified by MRN, date of birth, ID band Patient awake    Reviewed: Allergy & Precautions, NPO status , Patient's Chart, lab work & pertinent test results, reviewed documented beta blocker date and time   Airway Mallampati: III  TM Distance: >3 FB     Dental   Pulmonary sleep apnea , former smoker   breath sounds clear to auscultation       Cardiovascular hypertension, Pt. on medications and Pt. on home beta blockers + CAD, + Cardiac Stents, + Peripheral Vascular Disease and +CHF  + dysrhythmias Atrial Fibrillation  Rhythm:Regular Rate:Normal     Neuro/Psych negative neurological ROS     GI/Hepatic negative GI ROS, Neg liver ROS,,,  Endo/Other  Hypothyroidism    Renal/GU negative Renal ROS     Musculoskeletal  (+) Arthritis ,    Abdominal   Peds  Hematology negative hematology ROS (+) Last eliquis 6/9   Anesthesia Other Findings   Reproductive/Obstetrics                             Lab Results  Component Value Date   WBC 7.9 01/09/2023   HGB 13.9 01/09/2023   HCT 41.6 01/09/2023   MCV 93.9 01/09/2023   PLT 163 01/09/2023   Lab Results  Component Value Date   CREATININE 1.32 (H) 01/09/2023   BUN 18 01/09/2023   NA 137 01/09/2023   K 4.4 01/09/2023   CL 102 01/09/2023   CO2 26 01/09/2023    Anesthesia Physical Anesthesia Plan  ASA: 3  Anesthesia Plan: Spinal   Post-op Pain Management: Regional block* and Tylenol PO (pre-op)*   Induction:   PONV Risk Score and Plan: 1 and Propofol infusion, Dexamethasone and Ondansetron  Airway Management Planned: Natural Airway and Simple Face Mask  Additional Equipment: None  Intra-op Plan:   Post-operative Plan:   Informed Consent: I have reviewed the patients History and Physical, chart, labs and discussed the procedure including the risks, benefits and alternatives for the proposed anesthesia  with the patient or authorized representative who has indicated his/her understanding and acceptance.       Plan Discussed with: CRNA  Anesthesia Plan Comments: (See recent PAT note by Terance Hart, PA-C 11/20/22.  Surgery canceled at that time due to need for follow-up imaging of ascending aortic aneurysm patient did subsequently have follow-up and was cleared per telephone encounter 12/13/2022, "Chart reviewed as part of pre-operative protocol coverage. Pre-op clearance already addressed by colleagues in earlier phone notes. To summarize recommendations: -Please let Mr. Schafer know that his ascending aortic aneurysm is stable at 4.5 cm.  He should continue his current medications.  Given stability since 05/2021, we can defer repeat imaging for at least 2 years.  He can proceed with elective orthopedic surgery without additional cardiac testing or intervention.  Fluoroquinolone antibiotics should be avoided, as they have been associated with development/progression of aortic disease.  I will forward this message to our preop team and Dr. August Saucer for their review as well. -Dr. Okey Dupre"       )        Anesthesia Quick Evaluation

## 2023-01-14 ENCOUNTER — Other Ambulatory Visit: Payer: Self-pay | Admitting: Internal Medicine

## 2023-01-16 MED ORDER — TRANEXAMIC ACID 1000 MG/10ML IV SOLN
2000.0000 mg | INTRAVENOUS | Status: DC
  Filled 2023-01-16: qty 20

## 2023-01-17 ENCOUNTER — Other Ambulatory Visit: Payer: Self-pay

## 2023-01-17 ENCOUNTER — Ambulatory Visit (HOSPITAL_BASED_OUTPATIENT_CLINIC_OR_DEPARTMENT_OTHER): Payer: Medicare Other | Admitting: Anesthesiology

## 2023-01-17 ENCOUNTER — Ambulatory Visit (HOSPITAL_COMMUNITY): Payer: Medicare Other | Admitting: Physician Assistant

## 2023-01-17 ENCOUNTER — Observation Stay (HOSPITAL_COMMUNITY)
Admission: RE | Admit: 2023-01-17 | Discharge: 2023-01-18 | Disposition: A | Discharge status: Home / Self Care | Payer: Medicare Other | Attending: Orthopedic Surgery | Admitting: Orthopedic Surgery

## 2023-01-17 ENCOUNTER — Encounter (HOSPITAL_COMMUNITY): Payer: Self-pay | Admitting: Orthopedic Surgery

## 2023-01-17 ENCOUNTER — Encounter (HOSPITAL_COMMUNITY)
Admission: RE | Disposition: A | Discharge status: Home / Self Care | Payer: Self-pay | Source: Home / Self Care | Attending: Orthopedic Surgery

## 2023-01-17 DIAGNOSIS — I2510 Atherosclerotic heart disease of native coronary artery without angina pectoris: Secondary | ICD-10-CM | POA: Diagnosis not present

## 2023-01-17 DIAGNOSIS — M1712 Unilateral primary osteoarthritis, left knee: Principal | ICD-10-CM

## 2023-01-17 DIAGNOSIS — I110 Hypertensive heart disease with heart failure: Secondary | ICD-10-CM | POA: Diagnosis not present

## 2023-01-17 DIAGNOSIS — Z87891 Personal history of nicotine dependence: Secondary | ICD-10-CM

## 2023-01-17 DIAGNOSIS — I5030 Unspecified diastolic (congestive) heart failure: Secondary | ICD-10-CM | POA: Insufficient documentation

## 2023-01-17 DIAGNOSIS — M179 Osteoarthritis of knee, unspecified: Secondary | ICD-10-CM | POA: Diagnosis present

## 2023-01-17 DIAGNOSIS — G8918 Other acute postprocedural pain: Secondary | ICD-10-CM | POA: Diagnosis not present

## 2023-01-17 DIAGNOSIS — Z955 Presence of coronary angioplasty implant and graft: Secondary | ICD-10-CM

## 2023-01-17 DIAGNOSIS — Z01818 Encounter for other preprocedural examination: Secondary | ICD-10-CM

## 2023-01-17 DIAGNOSIS — E039 Hypothyroidism, unspecified: Secondary | ICD-10-CM | POA: Diagnosis not present

## 2023-01-17 DIAGNOSIS — Z85828 Personal history of other malignant neoplasm of skin: Secondary | ICD-10-CM | POA: Diagnosis not present

## 2023-01-17 DIAGNOSIS — I4819 Other persistent atrial fibrillation: Secondary | ICD-10-CM | POA: Insufficient documentation

## 2023-01-17 DIAGNOSIS — Z96652 Presence of left artificial knee joint: Secondary | ICD-10-CM

## 2023-01-17 DIAGNOSIS — I509 Heart failure, unspecified: Secondary | ICD-10-CM

## 2023-01-17 HISTORY — PX: TOTAL KNEE ARTHROPLASTY: SHX125

## 2023-01-17 SURGERY — ARTHROPLASTY, KNEE, TOTAL
Anesthesia: Spinal | Site: Knee | Laterality: Left

## 2023-01-17 MED ORDER — POVIDONE-IODINE 7.5 % EX SOLN
Freq: Once | CUTANEOUS | Status: DC
  Filled 2023-01-17: qty 118

## 2023-01-17 MED ORDER — POVIDONE-IODINE 10 % EX SWAB
2.0000 | Freq: Once | CUTANEOUS | Status: AC
  Administered 2023-01-17: 2 via TOPICAL

## 2023-01-17 MED ORDER — IRRISEPT - 450ML BOTTLE WITH 0.05% CHG IN STERILE WATER, USP 99.95% OPTIME
TOPICAL | Status: DC | PRN
  Administered 2023-01-17: 450 mL via TOPICAL

## 2023-01-17 MED ORDER — ALBUMIN HUMAN 5 % IV SOLN: INTRAVENOUS | Status: DC | PRN

## 2023-01-17 MED ORDER — ACETAMINOPHEN 500 MG PO TABS
1000.0000 mg | ORAL_TABLET | Freq: Four times a day (QID) | ORAL | Status: AC
  Administered 2023-01-17 – 2023-01-18 (×4): 1000 mg via ORAL
  Filled 2023-01-17 (×4): qty 2

## 2023-01-17 MED ORDER — FENTANYL CITRATE (PF) 250 MCG/5ML IJ SOLN
INTRAMUSCULAR | Status: DC | PRN
  Administered 2023-01-17 (×2): 25 ug via INTRAVENOUS

## 2023-01-17 MED ORDER — BUPIVACAINE LIPOSOME 1.3 % IJ SUSP
INTRAMUSCULAR | Status: DC | PRN
  Administered 2023-01-17: 20 mL

## 2023-01-17 MED ORDER — TRANEXAMIC ACID-NACL 1000-0.7 MG/100ML-% IV SOLN
1000.0000 mg | INTRAVENOUS | Status: AC
  Administered 2023-01-17: 1000 mg via INTRAVENOUS
  Filled 2023-01-17: qty 100

## 2023-01-17 MED ORDER — METOCLOPRAMIDE HCL 5 MG/ML IJ SOLN: 5.0000 mg | Freq: Three times a day (TID) | INTRAMUSCULAR | Status: DC | PRN

## 2023-01-17 MED ORDER — CEFAZOLIN SODIUM-DEXTROSE 2-4 GM/100ML-% IV SOLN
2.0000 g | INTRAVENOUS | Status: AC
  Administered 2023-01-17: 2 g via INTRAVENOUS
  Filled 2023-01-17: qty 100

## 2023-01-17 MED ORDER — CLONIDINE HCL (ANALGESIA) 100 MCG/ML EP SOLN
EPIDURAL | Status: DC | PRN
  Administered 2023-01-17: 1 mL via INTRA_ARTICULAR

## 2023-01-17 MED ORDER — AMISULPRIDE (ANTIEMETIC) 5 MG/2ML IV SOLN: 10.0000 mg | Freq: Once | INTRAVENOUS | Status: DC | PRN

## 2023-01-17 MED ORDER — PROPOFOL 10 MG/ML IV BOLUS
INTRAVENOUS | Status: AC
  Filled 2023-01-17: qty 20

## 2023-01-17 MED ORDER — METHOCARBAMOL 500 MG PO TABS: 500.0000 mg | ORAL_TABLET | Freq: Four times a day (QID) | ORAL | Status: DC | PRN

## 2023-01-17 MED ORDER — METOCLOPRAMIDE HCL 5 MG PO TABS: 5.0000 mg | ORAL_TABLET | Freq: Three times a day (TID) | ORAL | Status: DC | PRN

## 2023-01-17 MED ORDER — METHOCARBAMOL 1000 MG/10ML IJ SOLN: 500.0000 mg | Freq: Four times a day (QID) | INTRAVENOUS | Status: DC | PRN

## 2023-01-17 MED ORDER — MIDAZOLAM HCL 2 MG/2ML IJ SOLN
INTRAMUSCULAR | Status: DC | PRN
  Administered 2023-01-17: .5 mg via INTRAVENOUS

## 2023-01-17 MED ORDER — PROPOFOL 10 MG/ML IV BOLUS
INTRAVENOUS | Status: DC | PRN
  Administered 2023-01-17: 30 mg via INTRAVENOUS
  Administered 2023-01-17: 35 mg via INTRAVENOUS

## 2023-01-17 MED ORDER — BUPIVACAINE HCL 0.25 % IJ SOLN
INTRAMUSCULAR | Status: DC | PRN
  Administered 2023-01-17: 30 mL

## 2023-01-17 MED ORDER — MIDAZOLAM HCL 2 MG/2ML IJ SOLN
INTRAMUSCULAR | Status: AC
  Filled 2023-01-17: qty 2

## 2023-01-17 MED ORDER — FENTANYL CITRATE (PF) 100 MCG/2ML IJ SOLN
25.0000 ug | INTRAMUSCULAR | Status: DC | PRN
  Administered 2023-01-17 (×2): 25 ug via INTRAVENOUS

## 2023-01-17 MED ORDER — DOXAZOSIN MESYLATE 4 MG PO TABS
4.0000 mg | ORAL_TABLET | Freq: Every day | ORAL | Status: DC
  Administered 2023-01-18: 4 mg via ORAL
  Filled 2023-01-17: qty 1

## 2023-01-17 MED ORDER — MENTHOL 3 MG MT LOZG: 1.0000 | LOZENGE | OROMUCOSAL | Status: DC | PRN

## 2023-01-17 MED ORDER — VANCOMYCIN HCL 1000 MG IV SOLR
INTRAVENOUS | Status: AC
  Filled 2023-01-17: qty 20

## 2023-01-17 MED ORDER — MORPHINE SULFATE (PF) 4 MG/ML IV SOLN
INTRAVENOUS | Status: AC
  Filled 2023-01-17: qty 2

## 2023-01-17 MED ORDER — ONDANSETRON HCL 4 MG/2ML IJ SOLN
INTRAMUSCULAR | Status: DC | PRN
  Administered 2023-01-17: 4 mg via INTRAVENOUS

## 2023-01-17 MED ORDER — SODIUM CHLORIDE 0.9 % IR SOLN
Status: DC | PRN
  Administered 2023-01-17: 3000 mL

## 2023-01-17 MED ORDER — TRANEXAMIC ACID 1000 MG/10ML IV SOLN
INTRAVENOUS | Status: DC | PRN
  Administered 2023-01-17: 2000 mg via TOPICAL

## 2023-01-17 MED ORDER — FUROSEMIDE 20 MG PO TABS
20.0000 mg | ORAL_TABLET | ORAL | Status: DC
  Administered 2023-01-18: 20 mg via ORAL
  Filled 2023-01-17: qty 1

## 2023-01-17 MED ORDER — BUPIVACAINE-EPINEPHRINE (PF) 0.25% -1:200000 IJ SOLN
INTRAMUSCULAR | Status: AC
  Filled 2023-01-17: qty 30

## 2023-01-17 MED ORDER — ORAL CARE MOUTH RINSE: 15.0000 mL | Freq: Once | OROMUCOSAL | Status: AC

## 2023-01-17 MED ORDER — LEVOTHYROXINE SODIUM 125 MCG PO TABS: 125.0000 ug | ORAL_TABLET | Freq: Every day | ORAL | Status: DC

## 2023-01-17 MED ORDER — ATORVASTATIN CALCIUM 80 MG PO TABS
80.0000 mg | ORAL_TABLET | Freq: Every day | ORAL | Status: DC
  Administered 2023-01-17 – 2023-01-18 (×2): 80 mg via ORAL
  Filled 2023-01-17 (×2): qty 1

## 2023-01-17 MED ORDER — APIXABAN 5 MG PO TABS
5.0000 mg | ORAL_TABLET | Freq: Two times a day (BID) | ORAL | Status: DC
  Administered 2023-01-18: 5 mg via ORAL
  Filled 2023-01-17: qty 1

## 2023-01-17 MED ORDER — BUPIVACAINE HCL (PF) 0.25 % IJ SOLN
INTRAMUSCULAR | Status: AC
  Filled 2023-01-17: qty 10

## 2023-01-17 MED ORDER — LACTATED RINGERS IV SOLN: INTRAVENOUS | Status: DC

## 2023-01-17 MED ORDER — EPHEDRINE SULFATE-NACL 50-0.9 MG/10ML-% IV SOSY
PREFILLED_SYRINGE | INTRAVENOUS | Status: DC | PRN
  Administered 2023-01-17 (×5): 5 mg via INTRAVENOUS

## 2023-01-17 MED ORDER — BUPIVACAINE LIPOSOME 1.3 % IJ SUSP
INTRAMUSCULAR | Status: AC
  Filled 2023-01-17: qty 20

## 2023-01-17 MED ORDER — SODIUM CHLORIDE 0.9 % IV SOLN: INTRAVENOUS | Status: AC

## 2023-01-17 MED ORDER — EZETIMIBE 10 MG PO TABS
10.0000 mg | ORAL_TABLET | Freq: Every day | ORAL | Status: DC
  Administered 2023-01-18: 10 mg via ORAL
  Filled 2023-01-17: qty 1

## 2023-01-17 MED ORDER — SODIUM CHLORIDE 0.9% FLUSH
INTRAVENOUS | Status: DC | PRN
  Administered 2023-01-17: 20 mL via INTRAVENOUS

## 2023-01-17 MED ORDER — FENTANYL CITRATE (PF) 100 MCG/2ML IJ SOLN
INTRAMUSCULAR | Status: AC
  Filled 2023-01-17: qty 2

## 2023-01-17 MED ORDER — CLONIDINE HCL (ANALGESIA) 100 MCG/ML EP SOLN
EPIDURAL | Status: DC | PRN
  Administered 2023-01-17: 50 ug

## 2023-01-17 MED ORDER — ONDANSETRON HCL 4 MG PO TABS: 4.0000 mg | ORAL_TABLET | Freq: Four times a day (QID) | ORAL | Status: DC | PRN

## 2023-01-17 MED ORDER — ONDANSETRON HCL 4 MG/2ML IJ SOLN: 4.0000 mg | Freq: Four times a day (QID) | INTRAMUSCULAR | Status: DC | PRN

## 2023-01-17 MED ORDER — PHENYLEPHRINE HCL-NACL 20-0.9 MG/250ML-% IV SOLN
INTRAVENOUS | Status: DC | PRN
  Administered 2023-01-17: 15 ug/min via INTRAVENOUS

## 2023-01-17 MED ORDER — CEFAZOLIN SODIUM-DEXTROSE 2-4 GM/100ML-% IV SOLN
2.0000 g | Freq: Three times a day (TID) | INTRAVENOUS | Status: AC
  Administered 2023-01-17 (×2): 2 g via INTRAVENOUS
  Filled 2023-01-17 (×2): qty 100

## 2023-01-17 MED ORDER — METOPROLOL SUCCINATE ER 25 MG PO TB24
25.0000 mg | ORAL_TABLET | Freq: Every day | ORAL | Status: DC
  Administered 2023-01-18: 25 mg via ORAL
  Filled 2023-01-17: qty 1

## 2023-01-17 MED ORDER — VANCOMYCIN HCL 1000 MG IV SOLR
INTRAVENOUS | Status: DC | PRN
  Administered 2023-01-17: 1000 mg

## 2023-01-17 MED ORDER — GLYCOPYRROLATE PF 0.2 MG/ML IJ SOSY
PREFILLED_SYRINGE | INTRAMUSCULAR | Status: AC
  Filled 2023-01-17: qty 1

## 2023-01-17 MED ORDER — OXYCODONE HCL 5 MG PO TABS: 5.0000 mg | ORAL_TABLET | ORAL | Status: DC | PRN

## 2023-01-17 MED ORDER — MORPHINE SULFATE (PF) 4 MG/ML IV SOLN
INTRAVENOUS | Status: DC | PRN
  Administered 2023-01-17: 8 mg via INTRAVENOUS

## 2023-01-17 MED ORDER — LEVOTHYROXINE SODIUM 25 MCG PO TABS
125.0000 ug | ORAL_TABLET | ORAL | Status: DC
  Administered 2023-01-18: 125 ug via ORAL
  Filled 2023-01-17: qty 1

## 2023-01-17 MED ORDER — ACETAMINOPHEN 500 MG PO TABS
1000.0000 mg | ORAL_TABLET | Freq: Once | ORAL | Status: AC
  Administered 2023-01-17: 1000 mg via ORAL
  Filled 2023-01-17: qty 2

## 2023-01-17 MED ORDER — POVIDONE-IODINE 10 % EX SWAB: 2.0000 | Freq: Once | CUTANEOUS | Status: DC

## 2023-01-17 MED ORDER — ROPIVACAINE HCL 5 MG/ML IJ SOLN
INTRAMUSCULAR | Status: DC | PRN
  Administered 2023-01-17: 20 mL via PERINEURAL

## 2023-01-17 MED ORDER — FENTANYL CITRATE (PF) 250 MCG/5ML IJ SOLN
INTRAMUSCULAR | Status: AC
  Filled 2023-01-17: qty 5

## 2023-01-17 MED ORDER — CLONIDINE HCL (ANALGESIA) 100 MCG/ML EP SOLN
EPIDURAL | Status: AC
  Filled 2023-01-17: qty 10

## 2023-01-17 MED ORDER — DEXAMETHASONE SODIUM PHOSPHATE 10 MG/ML IJ SOLN
INTRAMUSCULAR | Status: AC
  Filled 2023-01-17: qty 1

## 2023-01-17 MED ORDER — DOCUSATE SODIUM 100 MG PO CAPS
100.0000 mg | ORAL_CAPSULE | Freq: Two times a day (BID) | ORAL | Status: DC
  Administered 2023-01-17 – 2023-01-18 (×2): 100 mg via ORAL
  Filled 2023-01-17 (×2): qty 1

## 2023-01-17 MED ORDER — RAMIPRIL 5 MG PO CAPS
10.0000 mg | ORAL_CAPSULE | Freq: Every day | ORAL | Status: DC
  Administered 2023-01-17 – 2023-01-18 (×2): 10 mg via ORAL
  Filled 2023-01-17 (×2): qty 2

## 2023-01-17 MED ORDER — BUPIVACAINE IN DEXTROSE 0.75-8.25 % IT SOLN
INTRATHECAL | Status: DC | PRN
  Administered 2023-01-17: 2 mL via INTRATHECAL

## 2023-01-17 MED ORDER — DOFETILIDE 500 MCG PO CAPS
500.0000 ug | ORAL_CAPSULE | Freq: Two times a day (BID) | ORAL | Status: DC
  Administered 2023-01-17 – 2023-01-18 (×2): 500 ug via ORAL
  Filled 2023-01-17 (×4): qty 1

## 2023-01-17 MED ORDER — DEXAMETHASONE SODIUM PHOSPHATE 10 MG/ML IJ SOLN
INTRAMUSCULAR | Status: DC | PRN
  Administered 2023-01-17: 10 mg via INTRAVENOUS

## 2023-01-17 MED ORDER — HYDROMORPHONE HCL 1 MG/ML IJ SOLN
0.5000 mg | INTRAMUSCULAR | Status: DC | PRN
  Administered 2023-01-18: 1 mg via INTRAVENOUS
  Filled 2023-01-17: qty 1

## 2023-01-17 MED ORDER — ONDANSETRON HCL 4 MG/2ML IJ SOLN
INTRAMUSCULAR | Status: AC
  Filled 2023-01-17: qty 2

## 2023-01-17 MED ORDER — ACETAMINOPHEN 325 MG PO TABS: 325.0000 mg | ORAL_TABLET | Freq: Four times a day (QID) | ORAL | Status: DC | PRN

## 2023-01-17 MED ORDER — PROPOFOL 500 MG/50ML IV EMUL
INTRAVENOUS | Status: DC | PRN
  Administered 2023-01-17: 25 ug/kg/min via INTRAVENOUS

## 2023-01-17 MED ORDER — CHLORHEXIDINE GLUCONATE 0.12 % MT SOLN
15.0000 mL | Freq: Once | OROMUCOSAL | Status: AC
  Administered 2023-01-17: 15 mL via OROMUCOSAL
  Filled 2023-01-17: qty 15

## 2023-01-17 MED ORDER — PHENOL 1.4 % MT LIQD: 1.0000 | OROMUCOSAL | Status: DC | PRN

## 2023-01-17 MED ORDER — 0.9 % SODIUM CHLORIDE (POUR BTL) OPTIME
TOPICAL | Status: DC | PRN
  Administered 2023-01-17: 1000 mL

## 2023-01-17 SURGICAL SUPPLY — 87 items
BAG COUNTER SPONGE SURGICOUNT (BAG) ×1 IMPLANT
BAG DECANTER FOR FLEXI CONT (MISCELLANEOUS) ×1 IMPLANT
BAG SPNG CNTER NS LX DISP (BAG) ×1
BANDAGE ESMARK 6X9 LF (GAUZE/BANDAGES/DRESSINGS) ×1 IMPLANT
BLADE SAG 18X100X1.27 (BLADE) ×1 IMPLANT
BLADE SAGITTAL (BLADE) ×1
BLADE SAW THK.89X75X18XSGTL (BLADE) ×1 IMPLANT
BNDG CMPR 5X6 CHSV STRCH STRL (GAUZE/BANDAGES/DRESSINGS) ×1
BNDG CMPR 9X6 STRL LF SNTH (GAUZE/BANDAGES/DRESSINGS) ×1
BNDG CMPR MED 15X6 ELC VLCR LF (GAUZE/BANDAGES/DRESSINGS) ×1
BNDG COHESIVE 6X5 TAN ST LF (GAUZE/BANDAGES/DRESSINGS) ×1 IMPLANT
BNDG ELASTIC 6X15 VLCR STRL LF (GAUZE/BANDAGES/DRESSINGS) ×1 IMPLANT
BNDG ESMARK 6X9 LF (GAUZE/BANDAGES/DRESSINGS) ×1
BOWL SMART MIX CTS (DISPOSABLE) IMPLANT
CEMENT BONE SIMPLEX SPEEDSET (Cement) IMPLANT
CLSR STERI-STRIP ANTIMIC 1/2X4 (GAUZE/BANDAGES/DRESSINGS) IMPLANT
CNTNR URN SCR LID CUP LEK RST (MISCELLANEOUS) ×1 IMPLANT
COMP FEM PS STD 11 LT (Joint) ×1 IMPLANT
COMP PATELLA POR NG 41X10 (Knees) ×1 IMPLANT
COMP TIB KNEE H 0D LT (Joint) ×1 IMPLANT
COMPONENT FEM PS STD 11 LT (Joint) IMPLANT
COMPONENT PATELLA POR NG 41X10 (Knees) IMPLANT
COMPONENT TIB KNEE H 0D LT (Joint) IMPLANT
CONT SPEC 4OZ STRL OR WHT (MISCELLANEOUS) ×1
COVER SURGICAL LIGHT HANDLE (MISCELLANEOUS) ×1 IMPLANT
CUFF TOURN SGL QUICK 34 (TOURNIQUET CUFF) ×1
CUFF TOURN SGL QUICK 42 (TOURNIQUET CUFF) IMPLANT
CUFF TRNQT CYL 34X4.125X (TOURNIQUET CUFF) ×1 IMPLANT
DRAPE INCISE IOBAN 66X45 STRL (DRAPES) IMPLANT
DRAPE ORTHO SPLIT 77X108 STRL (DRAPES) ×3
DRAPE SURG ORHT 6 SPLT 77X108 (DRAPES) ×3 IMPLANT
DRAPE U-SHAPE 47X51 STRL (DRAPES) ×1 IMPLANT
DRSG AQUACEL AG ADV 3.5X14 (GAUZE/BANDAGES/DRESSINGS) IMPLANT
DURAPREP 26ML APPLICATOR (WOUND CARE) ×2 IMPLANT
ELECT CAUTERY BLADE 6.4 (BLADE) ×1 IMPLANT
ELECT REM PT RETURN 9FT ADLT (ELECTROSURGICAL) ×1
ELECTRODE REM PT RTRN 9FT ADLT (ELECTROSURGICAL) ×1 IMPLANT
GAUZE SPONGE 4X4 12PLY STRL (GAUZE/BANDAGES/DRESSINGS) ×1 IMPLANT
GLOVE BIOGEL PI IND STRL 7.0 (GLOVE) ×1 IMPLANT
GLOVE BIOGEL PI IND STRL 8 (GLOVE) ×1 IMPLANT
GLOVE ECLIPSE 7.0 STRL STRAW (GLOVE) ×1 IMPLANT
GLOVE ECLIPSE 8.0 STRL XLNG CF (GLOVE) ×1 IMPLANT
GLOVE SURG ENC MOIS LTX SZ6.5 (GLOVE) ×3 IMPLANT
GOWN STRL REUS W/ TWL LRG LVL3 (GOWN DISPOSABLE) ×3 IMPLANT
GOWN STRL REUS W/TWL LRG LVL3 (GOWN DISPOSABLE) ×3
HANDPIECE INTERPULSE COAX TIP (DISPOSABLE) ×1
HOOD PEEL AWAY T7 (MISCELLANEOUS) ×3 IMPLANT
IMMOBILIZER KNEE 20 (SOFTGOODS) IMPLANT
IMMOBILIZER KNEE 20 THIGH 36 (SOFTGOODS) IMPLANT
IMMOBILIZER KNEE 22 UNIV (SOFTGOODS) IMPLANT
IMMOBILIZER KNEE 24 THIGH 36 (MISCELLANEOUS) IMPLANT
IMMOBILIZER KNEE 24 UNIV (MISCELLANEOUS) IMPLANT
INSERT TIBIA ARTIC SZ 8-11 13 (Joint) IMPLANT
KIT BASIN OR (CUSTOM PROCEDURE TRAY) ×1 IMPLANT
KIT TURNOVER KIT B (KITS) ×1 IMPLANT
MANIFOLD NEPTUNE II (INSTRUMENTS) ×1 IMPLANT
NDL 22X1.5 STRL (OR ONLY) (MISCELLANEOUS) ×2 IMPLANT
NDL SPNL 18GX3.5 QUINCKE PK (NEEDLE) ×1 IMPLANT
NEEDLE 22X1.5 STRL (OR ONLY) (MISCELLANEOUS) ×2 IMPLANT
NEEDLE SPNL 18GX3.5 QUINCKE PK (NEEDLE) ×1 IMPLANT
NS IRRIG 1000ML POUR BTL (IV SOLUTION) ×2 IMPLANT
PACK TOTAL JOINT (CUSTOM PROCEDURE TRAY) ×1 IMPLANT
PAD ARMBOARD 7.5X6 YLW CONV (MISCELLANEOUS) ×2 IMPLANT
PAD CAST 4YDX4 CTTN HI CHSV (CAST SUPPLIES) ×1 IMPLANT
PADDING CAST ABS COTTON 6X4 NS (CAST SUPPLIES) IMPLANT
PADDING CAST COTTON 4X4 STRL (CAST SUPPLIES) ×1
PADDING CAST COTTON 6X4 STRL (CAST SUPPLIES) ×1 IMPLANT
PIN DRILL HDLS TROCAR 75 4PK (PIN) IMPLANT
SCREW FEMALE HEX FIX 25X2.5 (ORTHOPEDIC DISPOSABLE SUPPLIES) IMPLANT
SET HNDPC FAN SPRY TIP SCT (DISPOSABLE) ×1 IMPLANT
SPIKE FLUID TRANSFER (MISCELLANEOUS) ×1 IMPLANT
STRIP CLOSURE SKIN 1/2X4 (GAUZE/BANDAGES/DRESSINGS) ×2 IMPLANT
SUCTION TUBE FRAZIER 10FR DISP (SUCTIONS) ×1 IMPLANT
SUT MNCRL AB 3-0 PS2 18 (SUTURE) ×1 IMPLANT
SUT VIC AB 0 CT1 27 (SUTURE) ×6
SUT VIC AB 0 CT1 27XBRD ANBCTR (SUTURE) ×3 IMPLANT
SUT VIC AB 1 CT1 36 (SUTURE) ×5 IMPLANT
SUT VIC AB 2-0 CT1 27 (SUTURE) ×2
SUT VIC AB 2-0 CT1 TAPERPNT 27 (SUTURE) ×4 IMPLANT
SUT VIC AB 2-0 CT2 27 (SUTURE) IMPLANT
SYR 30ML LL (SYRINGE) ×3 IMPLANT
SYR TB 1ML LUER SLIP (SYRINGE) ×1 IMPLANT
TOWEL GREEN STERILE (TOWEL DISPOSABLE) ×2 IMPLANT
TOWEL GREEN STERILE FF (TOWEL DISPOSABLE) ×2 IMPLANT
TRAY CATH INTERMITTENT SS 16FR (CATHETERS) IMPLANT
WATER STERILE IRR 1000ML POUR (IV SOLUTION) IMPLANT
YANKAUER SUCT BULB TIP NO VENT (SUCTIONS) ×1 IMPLANT

## 2023-01-17 NOTE — Anesthesia Procedure Notes (Addendum)
Spinal  Patient location during procedure: OR Start time: 01/17/2023 7:35 AM End time: 01/17/2023 7:41 AM Reason for block: surgical anesthesia Staffing Performed: anesthesiologist  Anesthesiologist: Marcene Duos, MD Performed by: Marcene Duos, MD Authorized by: Marcene Duos, MD   Preanesthetic Checklist Completed: patient identified, IV checked, site marked, risks and benefits discussed, surgical consent, monitors and equipment checked, pre-op evaluation and timeout performed Spinal Block Patient position: sitting Prep: DuraPrep Patient monitoring: heart rate, cardiac monitor, continuous pulse ox and blood pressure Approach: midline Location: L4-5 Injection technique: single-shot Needle Needle type: Pencan  Needle gauge: 24 G Needle length: 9 cm Assessment Sensory level: T4 Events: CSF return

## 2023-01-17 NOTE — Transfer of Care (Signed)
Immediate Anesthesia Transfer of Care Note  Patient: James [name]  Procedure(s) Performed: LEFT TOTAL KNEE ARTHROPLASTY (Left: Knee)  Patient Location: PACU  Anesthesia Type:Spinal  Level of Consciousness: awake, alert , oriented, patient cooperative, and responds to stimulation  Airway & Oxygen Therapy: Patient Spontanous Breathing and Patient connected to nasal cannula oxygen  Post-op Assessment: Report given to RN and Post -op Vital signs reviewed and stable  Post vital signs: Reviewed and stable  Last Vitals:  Vitals Value Taken Time  BP    Temp    Pulse 70 01/17/23 1045  Resp    SpO2 96 % 01/17/23 1045  Vitals shown include unvalidated device data.  Last Pain:  Vitals:   01/17/23 0619  TempSrc:   PainSc: 0-No pain         Complications: No notable events documented.

## 2023-01-17 NOTE — H&P (Addendum)
TOTAL KNEE ADMISSION H&P  Patient is being admitted for left total knee arthroplasty.  Subjective:  Chief Complaint:left knee pain.  HPI: James [name], 77 y.o. male, has a history of pain and functional disability in the left knee due to arthritis and has failed non-surgical conservative treatments for greater than 12 weeks to includeNSAID's and/or analgesics, corticosteriod injections, flexibility and strengthening excercises, and activity modification.  Onset of symptoms was gradual, starting >10 years ago with gradually worsening course since that time. The patient noted no past surgery on the left knee(s).  Patient currently rates pain in the left knee(s) at 8 out of 10 with activity. Patient has night pain, worsening of pain with activity and weight bearing, pain that interferes with activities of daily living, pain with passive range of motion, crepitus, and joint swelling.  Patient has evidence of subchondral sclerosis and joint space narrowing by imaging studies. This patient has had a history of atrial fibrillation with recent cardiac risk stratification.  No personal or family history of DVT or pulmonary embolism.  ABI 1.2 in the affected extremity.. There is no active infection.  Patient Active Problem List   Diagnosis Date Noted   Pruritus 03/21/2022   Afib (HCC) 11/07/2021   Rib injury 10/03/2021   Hyperglycemia 08/27/2021   Dysphagia 08/27/2021   Thoracic aortic aneurysm without rupture (HCC) 11/25/2019   Chronic heart failure with preserved ejection fraction (HFpEF) (HCC) 08/18/2018   Hyperlipidemia LDL goal <70 08/18/2018   Obstructive sleep apnea 05/23/2018   Coronary artery disease involving native coronary artery of native heart without angina pectoris 05/16/2018   Unstable angina (HCC)    CHF (congestive heart failure) (HCC) 05/09/2018   Benign prostatic hyperplasia with nocturia 05/07/2018   Dyspnea on exertion 02/19/2018   Persistent atrial fibrillation (HCC)  02/12/2018   Cardiac murmur 02/12/2018   Knee pain 08/07/2017   Weak urinary stream 05/01/2016   Healthcare maintenance 05/01/2016   Hematuria 03/06/2016   Hx of adenomatous colonic polyps 12/05/2015   Advance care planning 07/21/2014   Medicare annual wellness visit, subsequent 07/01/2012   External hemorrhoids 06/27/2011   Hypothyroidism 02/24/2008   HYPERCHOLESTEROLEMIA 02/21/2007   Essential hypertension 02/21/2007   DEGENERATIVE JOINT DISEASE, CERVICAL SPINE 02/21/2007   Past Medical History:  Diagnosis Date   (HFpEF) heart failure with preserved ejection fraction (HCC)    a. 05/2018 Echo: EF 55-60%, gr2 DD.   Acute lower GI bleeding after colonoscopy and polypectomy, resolved 12/12/2015   Ascending aortic aneurysm (HCC)    a. 02/2018 Echo: mildly dil Ao root/asc ao/arch; b. 05/2018 CTA Chest: 4.7cm fusiform aneurysm of Asc Ao.   CAD (coronary artery disease)    a. 05/2018 Cath/PCI: LM nl,LAD 30p, 90/99d/apical, LCX 61m, OM1/2/3 min irregs, RCA 85p (3.5x18 [place] DES).   Cardiac murmur    a. 02/2018 Echo: EF 60-65%, no rwma, mild AI, mildly dil Ao root/Asc Ao/Arch, Mild MR, mildly dil LA. Nl RV fxn.   Cataract    bilateral surgery to remove   CHF (congestive heart failure) (HCC)    Colon polyp    Constipation    Dysrhythmia    Hx A. Fib   Essential hypertension    Hemorrhoids    Hepatitis    self resolved, likely food exposure, 1974.    History of cardiovascular stress test    a. 02/2018 Myoview: EF 55-65%, small/mild apical defect w/ nl wall motion ->attenuation artifact. No ischemia. Low risk study.   Hx of adenomatous colonic polyps 12/05/2015  Hyperlipidemia    Hypothyroidism    Mitral regurgitation    a. 110/2019 Echo: EF 55-60%. Gr2 DD. Triv AI. Ao root 38mm. Mild MR. Mod dil LA, mildly dil RA.   Osteoarthritis    Persistent atrial fibrillation (HCC)    a. Dx 02/2018. CHA2DS2VASc = 2-->Eliquis; b. 03/2018 DCCV (200J); c. 03/2018 recurrent Afib-->flecainide started  04/2018;  d. 05/06/2018 s/p successful DCCV - 200J x 2-->on amio.   Skin cancer    basal cell, R ear, MOHS    Past Surgical History:  Procedure Laterality Date   BROW LIFT Bilateral 10/23/2016   Procedure: BLEPHAROPLASTY upper eyelid with excess skin;  Surgeon: Imagene Riches, MD;  Location: [facility];  Service: Ophthalmology;  Laterality: Bilateral;  MAC   CARDIOVERSION N/A 03/18/2018   Procedure: CARDIOVERSION;  Surgeon: Yvonne Kendall, MD;  Location: [facility] OR[facility];  Service: Cardiovascular;  Laterality: N/A;   CARDIOVERSION N/A 05/06/2018   Procedure: CARDIOVERSION;  Surgeon: Yvonne Kendall, MD;  Location: [facility];  Service: Cardiovascular;  Laterality: N/A;   CATARACT EXTRACTION  1986   OD   CATARACT EXTRACTION Bilateral 1995   x 2 for right and left    COLONOSCOPY     CORONARY STENT INTERVENTION N/A 05/13/2018   Procedure: CORONARY STENT INTERVENTION;  Surgeon: Yvonne Kendall, MD;  Location: [facility];  Service: Cardiovascular;  Laterality: N/A;   ECTROPION REPAIR Bilateral 10/23/2016   Procedure: REPAIR OF ECTROPION sutures, extensive;  Surgeon: Imagene Riches, MD;  Location: [facility];  Service: Ophthalmology;  Laterality: Bilateral;   LIPOMA EXCISION  06/1997   left neck (Juengel)   MOHS SURGERY Right 2013   behind right ear   RIGHT/LEFT HEART CATH AND CORONARY ANGIOGRAPHY N/A 05/12/2018   Procedure: RIGHT/LEFT HEART CATH AND CORONARY ANGIOGRAPHY;  Surgeon: Iran Ouch, MD;  Location: [facility];  Service: Cardiovascular;  Laterality: N/A;   TONSILLECTOMY     VASECTOMY  1982    Current Facility-Administered Medications  Medication Dose Route Frequency Provider Last Rate Last Admin   ceFAZolin (ANCEF) IVPB 2g/100 mL premix  2 g Intravenous On Call to OR Magnant, Charles L, PA-C       lactated ringers infusion   Intravenous Continuous Stoltzfus, Kearstyn Avitia P, DO       povidone-iodine (BETADINE) 7.5 % scrub   Topical Once Magnant, Charles L,  PA-C       povidone-iodine 10 % swab 2 Application  2 Application Topical Once Magnant, Charles L, PA-C       tranexamic acid (CYKLOKAPRON) 2,000 mg in sodium chloride 0.9 % 50 mL Topical Application  2,000 mg Topical To OR August Saucer, Corrie Mckusick, MD       tranexamic acid (CYKLOKAPRON) IVPB 1,000 mg  1,000 mg Intravenous To OR Magnant, Charles L, PA-C       Allergies  Allergen Reactions   Sulfonamide Derivatives Rash    childhood    Social History   Tobacco Use   Smoking status: Former    Packs/day: 2.00    Years: 20.00    Additional pack years: 0.00    Total pack years: 40.00    Types: Cigarettes    Quit date: 08/07/1983    Years since quitting: 39.4    Passive exposure: Past   Smokeless tobacco: Never   Tobacco comments:    Former smoker 11/17/21 quit over 40 years ago  Substance Use Topics   Alcohol use: Yes    Comment: 1-2 per day  Family History  Problem Relation Age of Onset   Stroke Mother    Lung cancer Maternal Grandfather        smoker   Stroke Maternal Grandfather    Heart disease Paternal Grandfather        MI, old age   Colon cancer Neg Hx    Colon polyps Neg Hx    Esophageal cancer Neg Hx    Rectal cancer Neg Hx    Stomach cancer Neg Hx    Bladder Cancer Neg Hx    Prostate cancer Neg Hx      Review of Systems  Musculoskeletal:  Positive for arthralgias.  All other systems reviewed and are negative.   Objective:  Physical Exam Vitals reviewed.  HENT:     Head: Normocephalic.     Nose: Nose normal.     Mouth/Throat:     Mouth: Mucous membranes are moist.  Eyes:     Pupils: Pupils are equal, round, and reactive to light.  Cardiovascular:     Rate and Rhythm: Normal rate.     Pulses: Normal pulses.  Pulmonary:     Effort: Pulmonary effort is normal.  Abdominal:     General: Abdomen is flat.  Musculoskeletal:     Cervical back: Normal range of motion.  Skin:    General: Skin is warm.     Capillary Refill: Capillary refill takes less than  2 seconds.  Neurological:     General: No focal deficit present.     Mental Status: He is alert.  Psychiatric:        Mood and Affect: Mood normal.    Ortho exam demonstrates left knee with small effusion.  He has no cellulitis or skin changes noted over the skin of the left knee.  He has range of motion from about 5 degrees extension to 115 degrees of knee flexion.  Mild varus alignment that is partially correctable.  Tenderness primarily over the medial joint line.  He has intact ankle dorsiflexion, plantarflexion.  Able to perform straight leg raise without extensor lag.  Excellent quad strength rated 5/5.  No tenderness over the lateral joint line.  Mild patellofemoral crepitus noted.  He has no pain with hip range of motion.  Negative FADIR sign.  Negative straight leg raise.    He did have ABIs  of the left leg of 1.22.  Vital signs in last 24 hours: Temp:  [98.8 F (37.1 C)] 98.8 F (37.1 C) (06/13 0550) Pulse Rate:  [53] 53 (06/13 0550) Resp:  [16] 16 (06/13 0550) BP: (174)/(81) 174/81 (06/13 0550) SpO2:  [97 %] 97 % (06/13 0550) Weight:  [113.4 kg] 113.4 kg (06/13 0550)  Labs:   Estimated body mass index is 32.98 kg/m as calculated from the following:   Height as of this encounter: 6\' 1"  (1.854 m).   Weight as of this encounter: 113.4 kg.   Imaging Review Plain radiographs demonstrate severe degenerative joint disease of the left knee(s). The overall alignment ismild varus. The bone quality appears to be good for age and reported activity level.      Assessment/Plan:  End stage arthritis, left knee   The patient history, physical examination, clinical judgment of the provider and imaging studies are consistent with end stage degenerative joint disease of the left knee(s) and total knee arthroplasty is deemed medically necessary. The treatment options including medical management, injection therapy arthroscopy and arthroplasty were discussed at length. The risks and  benefits of total knee  arthroplasty were presented and reviewed. The risks due to aseptic loosening, infection, stiffness, patella tracking problems, thromboembolic complications and other imponderables were discussed. The patient acknowledged the explanation, agreed to proceed with the plan and consent was signed. Patient is being admitted for inpatient treatment for surgery, pain control, PT, OT, prophylactic antibiotics, VTE prophylaxis, progressive ambulation and ADL's and discharge planning. The patient is planning to be discharged home with home health services  Patient is a 77 year old male who presents for evaluation of left knee pain. He has history of left knee osteoarthritis. Today's radiographs demonstrate severe medial and patellofemoral compartment arthritis with mild to moderate degenerative changes in the lateral compartment. There is mild varus alignment. He has been living with this pain for years. It has been been progressively worsening to the point where he iwants surgical intervention. We discussed the options available to patient including living with his symptoms versus aspiration/injection versus physical therapy versus total knee replacement. We had a lengthy discussion about the risks and benefits of knee replacement surgery including but not limited to the risk of nerve/blood vessel damage, persistent knee stiffness, infection, need for revision surgery in the future, fracture, medical complication from surgery such as DVT/PE/heart attack/stroke/death. We also had a lengthy discussion on the recovery timeframe and what to expect at the time of surgery as far as the fairly severe level of pain after surgery and the need for intensive rehabilitation for about 2 to 3 months postoperatively. After this discussion, he would like to proceed with knee replacement surgery. We will post him for knee replacement of the left knee. We will obtain cardiac risk stratification by his cardiologist, Dr.  Okey Dupre. . Anticipate spinal anesthetic at the time of procedure.  ABI of the left leg was 1.22. Follow-up after procedure    Patient's anticipated LOS is less than 2 midnights, meeting these requirements: - Younger than 83 - Lives within 1 hour of care - Has a competent adult at home to recover with post-op recover - NO history of  - Chronic pain requiring opiods  - Diabetes  - Coronary Artery Disease  - Heart failure  - Heart attack  - Stroke  - DVT/VTE  - Cardiac arrhythmia  - Respiratory Failure/COPD  - Renal failure  - Anemia  - Advanced Liver disease

## 2023-01-17 NOTE — Brief Op Note (Signed)
   01/17/2023  10:42 AM  PATIENT:  James [name]  77 y.o. male  PRE-OPERATIVE DIAGNOSIS:  left knee osteoarthritis  POST-OPERATIVE DIAGNOSIS:  left knee osteoarthritis  PROCEDURE:  Procedure(s): LEFT TOTAL KNEE ARTHROPLASTY  SURGEON:  Surgeon(s): Cammy Copa, MD  ASSISTANT: magnant pa  ANESTHESIA:   spinal  EBL: 125 ml    Total I/O In: 1050 [I.V.:600; IV Piggyback:450] Out: 575 [Urine:450; Blood:125]  BLOOD ADMINISTERED: none  DRAINS: none   LOCAL MEDICATIONS USED:  marcaine mso4 clonidine exparel  SPECIMEN:  No Specimen  COUNTS:  YES  TOURNIQUET:   Total Tourniquet Time Documented: Thigh (Left) - 96 minutes Total: Thigh (Left) - 96 minutes   DICTATION: .Other Dictation: Dictation Number [id number]  PLAN OF CARE: Admit for overnight observation  PATIENT DISPOSITION:  PACU - hemodynamically stable

## 2023-01-17 NOTE — Op Note (Signed)
NAME: James [name], James [name] MEDICAL RECORD NO: [id number] ACCOUNT NO: [id number] DATE OF BIRTH: Dec 10, 1945 FACILITY: [facility] LOCATION: [facility] PHYSICIAN: Graylin Shiver. August Saucer, MD  Operative Report   DATE OF PROCEDURE: 01/17/2023  PREOPERATIVE DIAGNOSIS:  Left knee arthritis.  POSTOPERATIVE DIAGNOSIS:  Left knee arthritis.  PROCEDURE:  Left total knee replacement using press fit Persona cruciate retaining size 11 femur, Persona personalized size H tibia press fit with 13 mm medial congruent polyethylene insert, 41 mm press-fit patella.  SURGEON:  Graylin Shiver. August Saucer, MD  ASSISTANT:  Karenann Cai, PA.  INDICATIONS:  The patient is a 77 year old patient with end-stage left knee arthritis, who presents for operative management after explanation of risks and benefits.  DESCRIPTION OF PROCEDURE:  The patient was brought to the operating room where spinal anesthetic was induced.  Preoperative antibiotics administered.  Timeout was called.  Left leg was prescrubbed with alcohol and Betadine, allowed to air dry, prepped  with DuraPrep solution and draped in sterile manner.  Ioban used to cover the operative field.  After calling timeout anterior approach to the knee was made after exsanguinating the leg and elevating the tourniquet.  Skin and subcutaneous tissue were  sharply divided.  Median parapatellar approach was made and marked with #1 Vicryl suture.  IrriSept solution used after the incision and after the arthrotomy.  Partial excision of the fat pad was performed.  Medial soft tissue dissection was performed  proportional to the patient's mild preoperative varus deformity.  Lateral patellofemoral ligament was released.  Soft tissue removed from the anterior distal femur.  Patella was everted, knee was flexed.  Anterior horn lateral meniscus was released.  ACL  released.  Osteophytes were removed.  Posterior retractor placed.  Hohmann placed laterally.  Severe tricompartmental arthritis was present.   Intramedullary alignment was then used to make a cut of initially 2 mm off the most affected medial aspect of  the tibia followed by 2 more millimeters to get underneath the sclerotic bone.  This was cut perpendicular to the mechanical axis with the posterior neurovascular structures and collaterals protected.  Intramedullary alignment was then used to make an  initial cut of 12 mm off the distal femur because the patient's preoperative flexion contracture, which was about 15 degrees. The cut was later revised 2 more millimeters in order to secure a 10 mm spacer with full extension. Next, the femur was sized to  a 11, placed in 3 degrees of external rotation, which gave symmetric flexion and extension gaps of about 12 mm.  Cuts were made on the anterior, posterior and chamfer cuts.  Tibia was then keel punched, aligned with the medial third of the tibial  tubercle.  Tibial tray was allowed to sit and we placed on trial femur.  The patient had a good stability to varus and valgus stress at 0, 30 and 90 degrees with a 12 mm spacer.  Patella was then trialled.  The patella was then cut down from 24 to 14 mm,  and a 10 mm patellar button was placed.  With trial components in position the patient had full extension, full flexion with good patellar tracking using no thumbs technique and very good stability to varus and valgus stress at 0, 30 and 90 degrees.  Trial components were removed.  Tibia was keel punched and final preparation was made on the femur.  Thorough irrigation was then performed with 3 liters of pulsatile irrigation followed by anesthetic agents of Marcaine, Exparel and saline placed into  the  knee joint capsule.  IrriSept solution and a TXA sponge was allowed to sit in the knee for 3 minutes.  These were removed.  Vancomycin powder placed into the tibial canal and the components were tapped into position with good press fit obtained.  A  13 mm spacer was chosen.  This gave a good stability  without any loss of extension.  After placing the true medial congruent 13 mm spacer the Tourniquet was released.  Bleeding points encountered controlled using electrocautery.  3 liters of pouring  irrigation was utilized at this time, the arthrotomy was then closed over bolster using #1 Vicryl suture.  Prior to final arthrotomy closure the knee joint was irrigated with IrriSept solution.  Vancomycin powder placed and then the arthrotomy was  closed.  Next, we injected the knee with Marcaine, morphine, Clonidine.  Next, the incision was closed using 0 Vicryl suture, 2-0 Vicryl suture, and 3-0 Monocryl with Steri-Strips and Aquacel dressing applied.  The patient tolerated the procedure well  without immediate complications, transferred to the recovery room in stable condition.  Luke's assistance was required at all times for retraction, opening, closing, mobilization of tissue.  His assistance was a medical necessity.   PUS D: 01/17/2023 10:49:09 am T: 01/17/2023 11:40:00 am  JOB: [id number]/ [id number]

## 2023-01-17 NOTE — Anesthesia Procedure Notes (Signed)
Procedure Name: MAC Date/Time: 01/17/2023 7:35 AM  Performed by: Shary Decamp, CRNAPre-anesthesia Checklist: Patient identified, Emergency Drugs available, Suction available, Timeout performed and Patient being monitored Patient Re-evaluated:Patient Re-evaluated prior to induction Oxygen Delivery Method: Simple face mask

## 2023-01-17 NOTE — Anesthesia Postprocedure Evaluation (Signed)
Anesthesia Post Note  Patient: James [name]  Procedure(s) Performed: LEFT TOTAL KNEE ARTHROPLASTY (Left: Knee)     Patient location during evaluation: PACU Anesthesia Type: Spinal Level of consciousness: awake and alert Pain management: pain level controlled Vital Signs Assessment: post-procedure vital signs reviewed and stable Respiratory status: spontaneous breathing and respiratory function stable Cardiovascular status: blood pressure returned to baseline and stable Postop Assessment: spinal receding Anesthetic complications: no  No notable events documented.  Last Vitals:  Vitals:   01/17/23 1245 01/17/23 1304  BP: (!) 163/71 (!) 147/88  Pulse: (!) 40   Resp: 11 17  Temp: (!) 36.4 C 36.4 C  SpO2: 100% 100%    Last Pain:  Vitals:   01/17/23 1304  TempSrc: Oral  PainSc:                  Kennieth Rad

## 2023-01-17 NOTE — Progress Notes (Signed)
Orthopedic Tech Progress Note Patient Details:  ANTRONE WALLA September 08, 1945 [id number]  Ortho Devices Type of Ortho Device: Bone foam zero knee Ortho Device/Splint Location: LLE Ortho Device/Splint Interventions: Ordered, Application   Post Interventions Patient Tolerated: Well Instructions Provided: Poper ambulation with device  Talin Rozeboom A Kalid Ghan 01/17/2023, 3:04 PM

## 2023-01-17 NOTE — Anesthesia Procedure Notes (Signed)
Anesthesia Regional Block: Adductor canal block   Pre-Anesthetic Checklist: , timeout performed,  Correct Patient, Correct Site, Correct Laterality,  Correct Procedure, Correct Position, site marked,  Risks and benefits discussed,  Surgical consent,  Pre-op evaluation,  At surgeon's request and post-op pain management  Laterality: Left  Prep: chloraprep       Needles:  Injection technique: Single-shot  Needle Type: Echogenic Needle     Needle Length: 9cm  Needle Gauge: 21     Additional Needles:   Procedures:,,,, ultrasound used (permanent image in chart),,    Narrative:  Start time: 01/17/2023 7:15 AM End time: 01/17/2023 7:21 AM Injection made incrementally with aspirations every 5 mL.  Performed by: Personally  Anesthesiologist: Marcene Duos, MD

## 2023-01-18 ENCOUNTER — Encounter (HOSPITAL_COMMUNITY): Payer: Self-pay | Admitting: Orthopedic Surgery

## 2023-01-18 DIAGNOSIS — M1712 Unilateral primary osteoarthritis, left knee: Secondary | ICD-10-CM | POA: Diagnosis not present

## 2023-01-18 DIAGNOSIS — L299 Pruritus, unspecified: Secondary | ICD-10-CM | POA: Diagnosis not present

## 2023-01-18 DIAGNOSIS — I110 Hypertensive heart disease with heart failure: Secondary | ICD-10-CM | POA: Diagnosis not present

## 2023-01-18 DIAGNOSIS — I4819 Other persistent atrial fibrillation: Secondary | ICD-10-CM | POA: Diagnosis not present

## 2023-01-18 DIAGNOSIS — Z85828 Personal history of other malignant neoplasm of skin: Secondary | ICD-10-CM | POA: Diagnosis not present

## 2023-01-18 DIAGNOSIS — Z87891 Personal history of nicotine dependence: Secondary | ICD-10-CM | POA: Diagnosis not present

## 2023-01-18 DIAGNOSIS — I5030 Unspecified diastolic (congestive) heart failure: Secondary | ICD-10-CM | POA: Diagnosis not present

## 2023-01-18 DIAGNOSIS — I4891 Unspecified atrial fibrillation: Secondary | ICD-10-CM | POA: Diagnosis not present

## 2023-01-18 DIAGNOSIS — I2510 Atherosclerotic heart disease of native coronary artery without angina pectoris: Secondary | ICD-10-CM | POA: Diagnosis not present

## 2023-01-18 DIAGNOSIS — Z96652 Presence of left artificial knee joint: Secondary | ICD-10-CM | POA: Diagnosis not present

## 2023-01-18 DIAGNOSIS — E039 Hypothyroidism, unspecified: Secondary | ICD-10-CM | POA: Diagnosis not present

## 2023-01-18 LAB — BASIC METABOLIC PANEL
Anion gap: 8 (ref 5–15)
BUN: 20 mg/dL (ref 8–23)
CO2: 24 mmol/L (ref 22–32)
Calcium: 8.8 mg/dL — ABNORMAL LOW (ref 8.9–10.3)
Chloride: 107 mmol/L (ref 98–111)
Creatinine, Ser: 1.39 mg/dL — ABNORMAL HIGH (ref 0.61–1.24)
GFR, Estimated: 53 mL/min — ABNORMAL LOW (ref >60)
Glucose, Bld: 115 mg/dL — ABNORMAL HIGH (ref 70–99)
Potassium: 4.6 mmol/L (ref 3.5–5.1)
Sodium: 139 mmol/L (ref 135–145)

## 2023-01-18 LAB — MAGNESIUM: Magnesium: 2.2 mg/dL (ref 1.7–2.4)

## 2023-01-18 MED ORDER — DOCUSATE SODIUM 100 MG PO CAPS: 100.0000 mg | ORAL_CAPSULE | Freq: Two times a day (BID) | ORAL | 0 refills | Status: DC

## 2023-01-18 MED ORDER — METHOCARBAMOL 500 MG PO TABS: 500.0000 mg | ORAL_TABLET | Freq: Three times a day (TID) | ORAL | 0 refills | Status: AC | PRN

## 2023-01-18 MED ORDER — OXYCODONE HCL 5 MG PO TABS: 5.0000 mg | ORAL_TABLET | ORAL | 0 refills | Status: DC | PRN

## 2023-01-18 NOTE — TOC Progression Note (Signed)
Transition of Care [facility]) - Progression Note    Patient Details  Name: James [name] MRN: [id number] Date of Birth: 07/13/1946  [facility] CM/SW Contact  Epifanio Lesches, RN Phone Number: 01/18/2023, 5:14 PM  Clinical Narrative:    Pt hoping to d/c today. PT has cleared. Referral made with [employer] for DME , B[facility]. Equipment will be delivered to bedside prior to d/c. Pt without  RX med concerns. [facility] prearranged by provider with [facility]. Post hospital f/I noted on AVS. Wife to provide transportation home.   Expected Discharge Plan: Home w Home Health Services    Expected Discharge Plan and Services                         DME Arranged: Bedside commode DME Agency: [employer] Date DME Agency Contacted: 01/18/23 Time DME Agency Contacted: [phone] Representative spoke with at DME Agency: Marthann Schiller HH Arranged: PT HH Agency: [facility] Date [facility] Agency Contacted: 01/18/23   Representative spoke with at [facility] Agency: Amy   Social Determinants of Health (SDOH) Interventions SDOH Screenings   Food Insecurity: No Food Insecurity (01/17/2023)  Housing: Low Risk  (01/17/2023)  Transportation Needs: No Transportation Needs (01/17/2023)  Utilities: Not At Risk (01/17/2023)  Alcohol Screen: Low Risk  (08/18/2022)  Depression (PHQ2-9): Low Risk  (08/20/2022)  Financial Resource Strain: Low Risk  (08/20/2022)  Physical Activity: Insufficiently Active (08/20/2022)  Social Connections: Unknown (08/20/2022)  Stress: No Stress Concern Present (08/20/2022)  Tobacco Use: Medium Risk (01/18/2023)    Readmission Risk Interventions     No data to display

## 2023-01-18 NOTE — Evaluation (Signed)
Occupational Therapy Evaluation and Discharge Patient Details Name: James [name] MRN: [id number] DOB: 06-28-1946 Today's Date: 01/18/2023   History of Present Illness 77 y.o. male admitted 6/13 s/p  LEFT TOTAL KNEE ARTHROPLASTY. PMH: (HFpEF) heart failure with preserved ejection fraction   Acute lower GI bleeding after colonoscopy and polypectomy, resolved 12/12/2015  Ascending aortic aneurysm     CAD (coronary artery disease)    Cardiac murmur    Cataract    CHF (congestive heart failure)     Colon polyp    Constipation    Dysrhythmia    Hx A. Fib  Essential hypertension    Hemorrhoids    Hepatitis    Hx of adenomatous colonic polyps   Hyperlipidemia    Hypothyroidism    Mitral regurgitation    Osteoarthritis    Persistent atrial fibrillation [facility])   Clinical Impression   This 77 yo male admitted and underwent above presents to acute OT with all education completed and written down for him from an ADL standpoint. Offered to have pt practice any and all of the items we went over and he politely declined stating he felt like he had the concept of everything we talked about. We will D/C from acute OT.      Recommendations for follow up therapy are one component of a multi-disciplinary discharge planning process, led by the attending physician.  Recommendations may be updated based on patient status, additional functional criteria and insurance authorization.   Assistance Recommended at Discharge PRN  Patient can return home with the following A lot of help with bathing/dressing/bathroom;Assistance with cooking/housework;Assist for transportation;Help with stairs or ramp for entrance    Functional Status Assessment  Patient has had a recent decline in their functional status and demonstrates the ability to make significant improvements in function in a reasonable and predictable amount of time. (without further need for skilled OT intervention, all education completed and written down for him on  back of his LE exercise sheet.)  Equipment Recommendations  BSC/3in1       Precautions / Restrictions Precautions Precautions: Knee Precaution Booklet Issued: Yes (comment) Required Braces or Orthoses: Knee Immobilizer - Left Restrictions Weight Bearing Restrictions: Yes LLE Weight Bearing: Weight bearing as tolerated             ADL either performed or assessed with clinical judgement   ADL Overall ADL's : Needs assistance/impaired Eating/Feeding: Independent;Sitting   Grooming: Set up;Sitting   Upper Body Bathing: Set up;Sitting   Lower Body Bathing: Minimal assistance   Upper Body Dressing : Set up;Sitting   Lower Body Dressing: Moderate assistance Lower Body Dressing Details (indicate cue type and reason): Educated pt on sequence of dressing and undressing LB   Toilet Transfer Details (indicate cue type and reason): Educated pt on sit<>stand from toilet using one hand on RW and one hand on sink beside of toilet       Tub/Shower Transfer Details (indicate cue type and reason): Educated pt on "in with the good, out with the bad" for stepping into and out his shower stall at home. He also has to figure out with the help of his wife as to whether he can put 3n1 in shower and manuver around it or walk in to shower stall and let wife place seat behind him         Vision Patient Visual Report: No change from baseline              Pertinent Vitals/Pain Pain Assessment  Pain Assessment: 0-10 Pain Score: 8  Pain Location: Left knee (in bone foam) Pain Descriptors / Indicators: Aching, Grimacing, Guarding Pain Intervention(s): Limited activity within patient's tolerance, Monitored during session, Repositioned (removed bone foam for 10 minutes to give him some relief)     Hand Dominance Right   Extremity/Trunk Assessment Upper Extremity Assessment Upper Extremity Assessment: Overall WFL for tasks assessed     Communication Communication Communication: No  difficulties   Cognition Arousal/Alertness: Awake/alert Behavior During Therapy: WFL for tasks assessed/performed Overall Cognitive Status: Within Functional Limits for tasks assessed                                       General Comments  Reviewed precautions, ice use, KI use, bone foam            Home Living Family/patient expects to be discharged to:: Private residence Living Arrangements: Spouse/significant other Available Help at Discharge: Family;Available 24 hours/day Type of Home: House Home Access: Level entry     Home Layout: One level     Bathroom Shower/Tub: Producer, television/film/video: Handicapped height Bathroom Accessibility: Yes   Home Equipment: Agricultural consultant (2 wheels)          Prior Functioning/Environment Prior Level of Function : Independent/Modified Independent;Driving             Mobility Comments: Independent ADLs Comments: Independent        OT Problem List: Decreased strength;Decreased range of motion;Impaired balance (sitting and/or standing);Pain         OT Goals(Current goals can be found in the care plan section) Acute Rehab OT Goals Patient Stated Goal: hopeful for home this PM         AM-PAC OT "6 Clicks" Daily Activity     Outcome Measure Help from another person eating meals?: None Help from another person taking care of personal grooming?: A Little Help from another person toileting, which includes using toliet, bedpan, or urinal?: A Little Help from another person bathing (including washing, rinsing, drying)?: A Lot Help from another person to put on and taking off regular upper body clothing?: A Little Help from another person to put on and taking off regular lower body clothing?: A Lot 6 Click Score: 17   End of Session CPM Left Knee CPM Left Knee: Off  Activity Tolerance: Patient tolerated treatment well Patient left: in chair;with call bell/phone within reach  OT Visit Diagnosis:  Other abnormalities of gait and mobility (R26.89);Muscle weakness (generalized) (M62.81);Pain Pain - Right/Left: Left Pain - part of body: Knee                Time: 0981-1914 OT Time Calculation (min): 22 min Charges:  OT General Charges $OT Visit: 1 Visit OT Evaluation $OT Eval Moderate Complexity: 1 Mod Cathy L. OT Acute Rehabilitation Services Office [phone]    Evette Georges 01/18/2023, 11:38 AM

## 2023-01-18 NOTE — Evaluation (Signed)
Physical Therapy Evaluation Patient Details Name: James [name] MRN: [id number] DOB: 04-29-1946 Today's Date: 01/18/2023  History of Present Illness  77 y.o. male admitted 6/13 s/p  LEFT TOTAL KNEE ARTHROPLASTY. PMH: (HFpEF) heart failure with preserved ejection fraction   Acute lower GI bleeding after colonoscopy and polypectomy, resolved 12/12/2015  Ascending aortic aneurysm     CAD (coronary artery disease)    Cardiac murmur    Cataract    CHF (congestive heart failure)     Colon polyp    Constipation    Dysrhythmia    Hx A. Fib  Essential hypertension    Hemorrhoids    Hepatitis    Hx of adenomatous colonic polyps   Hyperlipidemia    Hypothyroidism    Mitral regurgitation    Osteoarthritis    Persistent atrial fibrillation [facility])  Clinical Impression  Patient is s/p above surgery resulting in functional limitations due to the deficits listed below (see PT Problem List). Previously independent, plans to return home with wife who will be available to assist 24/7 at d/c. Educated on techniques for independence with bed mobility, transfer, and initiated gait with rolling walker. SLR >10 deg, still with some instability in Lt knee and lateral calf numbness present. Pt uses RW appropriately with training, up to 40 feet this morning. Mobilizing at a min guard level. Will follow up this afternoon to progress further; he would like to d/c home today if possible.  Reviewed precautions and handout with HEP provided. Patient will benefit from acute skilled PT to increase their independence and safety with mobility to facilitate discharge.        Recommendations for follow up therapy are one component of a multi-disciplinary discharge planning process, led by the attending physician.  Recommendations may be updated based on patient status, additional functional criteria and insurance authorization.     Assistance Recommended at Discharge Intermittent Supervision/Assistance  Patient can return home with the  following  A little help with walking and/or transfers;A little help with bathing/dressing/bathroom;Assistance with cooking/housework;Assist for transportation    Equipment Recommendations None recommended by PT (Pt reports he has RW)  Recommendations for Other Services  OT consult (per protocol)    Functional Status Assessment Patient has had a recent decline in their functional status and demonstrates the ability to make significant improvements in function in a reasonable and predictable amount of time.     Precautions / Restrictions Precautions Precautions: Knee Precaution Booklet Issued: Yes (comment) Required Braces or Orthoses: Knee Immobilizer - Left Restrictions Weight Bearing Restrictions: Yes LLE Weight Bearing: Weight bearing as tolerated      Mobility  Bed Mobility Overal bed mobility: Needs Assistance Bed Mobility: Supine to Sit     Supine to sit: Min guard     General bed mobility comments: Educated on technique, utilized sheet to assist with LLE out of bed, no physical assist needed.    Transfers Overall transfer level: Needs assistance Equipment used: Rolling walker (2 wheels) Transfers: Sit to/from Stand Sit to Stand: Min guard           General transfer comment: Min guard for safety. Cues for technique. Good powerup on RLE. Stable with heavy UE support on RW upon standing.    Ambulation/Gait Ambulation/Gait assistance: Min guard Gait Distance (Feet): 40 Feet Assistive device: Rolling walker (2 wheels) Gait Pattern/deviations: Step-to pattern, Decreased stride length, Decreased weight shift to left, Antalgic Gait velocity: decr Gait velocity interpretation: <1.31 ft/sec, indicative of household ambulator Pre-gait activities: lateral shift  and march General Gait Details: Educated on safe AD use with RW, heavy use for support to unload Lt knee. Showing mild instability but compensating well with RW. Educated on use of KI when ambulating without  physical therapist. Reviewed sequencing, safety, and awareness.  Stairs            Wheelchair Mobility    Modified Rankin (Stroke Patients Only)       Balance Overall balance assessment: Needs assistance Sitting-balance support: No upper extremity supported, Feet supported Sitting balance-Leahy Scale: Normal     Standing balance support: Single extremity supported Standing balance-Leahy Scale: Poor                               Pertinent Vitals/Pain Pain Assessment Pain Assessment: 0-10 Pain Score: 8  Pain Location: Left knee Pain Descriptors / Indicators: Aching Pain Intervention(s): Monitored during session, Repositioned    Home Living Family/patient expects to be discharged to:: Private residence Living Arrangements: Spouse/significant other Available Help at Discharge: Family;Available 24 hours/day Type of Home: House Home Access: Level entry       Home Layout: One level Home Equipment: Agricultural consultant (2 wheels)      Prior Function Prior Level of Function : Independent/Modified Independent;Driving             Mobility Comments: ind, painful to get around but capable. ADLs Comments: ind     Hand Dominance   Dominant Hand: Right    Extremity/Trunk Assessment   Upper Extremity Assessment Upper Extremity Assessment: Defer to OT evaluation    Lower Extremity Assessment Lower Extremity Assessment: LLE deficits/detail LLE Deficits / Details: Some lateral numbness left calf, SLR lag >10 deg. edematous, guarded. As expected post-op. LLE: Unable to fully assess due to pain LLE Sensation: decreased light touch       Communication   Communication: No difficulties  Cognition Arousal/Alertness: Awake/alert Behavior During Therapy: WFL for tasks assessed/performed Overall Cognitive Status: Within Functional Limits for tasks assessed                                          General Comments General comments (skin  integrity, edema, etc.): Reviewed precautions, ice use, KI use, bone foam    Exercises Total Joint Exercises Ankle Circles/Pumps: AROM, Both, 10 reps, Supine Quad Sets: Strengthening, Both, 10 reps, Supine Heel Slides: AROM, Left, 5 reps, Seated Straight Leg Raises: AAROM, Left, 5 reps, Supine   Assessment/Plan    PT Assessment Patient needs continued PT services  PT Problem List Decreased strength;Decreased range of motion;Decreased activity tolerance;Decreased balance;Decreased mobility;Decreased knowledge of use of DME;Decreased knowledge of precautions;Impaired sensation;Pain       PT Treatment Interventions DME instruction;Gait training;Functional mobility training;Therapeutic activities;Therapeutic exercise;Balance training;Neuromuscular re-education;Patient/family education;Modalities    PT Goals (Current goals can be found in the Care Plan section)  Acute Rehab PT Goals Patient Stated Goal: Go home today PT Goal Formulation: With patient Time For Goal Achievement: 01/22/23 Potential to Achieve Goals: Good    Frequency 7X/week     Co-evaluation               AM-PAC PT "6 Clicks" Mobility  Outcome Measure Help needed turning from your back to your side while in a flat bed without using bedrails?: A Little Help needed moving from lying on your back to sitting on  the side of a flat bed without using bedrails?: A Little Help needed moving to and from a bed to a chair (including a wheelchair)?: A Little Help needed standing up from a chair using your arms (e.g., wheelchair or bedside chair)?: A Little Help needed to walk in hospital room?: A Little Help needed climbing 3-5 steps with a railing? : A Lot 6 Click Score: 17    End of Session Equipment Utilized During Treatment: Gait belt Activity Tolerance: Patient tolerated treatment well Patient left: in chair;with call bell/phone within reach;with chair alarm set;with SCD's reapplied Nurse Communication: Mobility  status PT Visit Diagnosis: Unsteadiness on feet (R26.81);Other abnormalities of gait and mobility (R26.89);Difficulty in walking, not elsewhere classified (R26.2);Pain Pain - Right/Left: Left Pain - part of body: Knee    Time: 0912-0950 PT Time Calculation (min) (ACUTE ONLY): 38 min   Charges:   PT Evaluation $PT Eval Low Complexity: 1 Low PT Treatments $Gait Training: 8-22 mins $Therapeutic Activity: 8-22 mins        Kathlyn Sacramento, PT, DPT [facility]  Rehabilitation Services Physical Therapist Office: [phone] Website: [email or web address]   Berton Mount 01/18/2023, 10:55 AM

## 2023-01-18 NOTE — Progress Notes (Signed)
  Subjective: Patient stable.  Pain controlled.  Tolerated CPM well yesterday.  Was not seen by physical therapy yesterday.   Objective: Vital signs in last 24 hours: Temp:  [97.3 F (36.3 C)-99.6 F (37.6 C)] 99.6 F (37.6 C) (06/14 0539) Pulse Rate:  [40-89] 49 (06/14 0539) Resp:  [10-18] 14 (06/14 0539) BP: (99-163)/(66-106) 140/71 (06/14 0539) SpO2:  [97 %-100 %] 99 % (06/14 0539)  Intake/Output from previous day: 06/13 0701 - 06/14 0700 In: 1645 [P.O.:120; I.V.:975; IV Piggyback:550] Out: 1475 [Urine:1350; Blood:125] Intake/Output this shift: No intake/output data recorded.  Exam:  Patient can do a straight leg raise left side Foot is sensate perfused and mobile Dressing is dry.  Ace wrap removed Labs: No results for input(s): "HGB" in the last 72 hours. No results for input(s): "WBC", "RBC", "HCT", "PLT" in the last 72 hours. Recent Labs    01/18/23 0211  NA 139  K 4.6  CL 107  CO2 24  BUN 20  CREATININE 1.39*  GLUCOSE 115*  CALCIUM 8.8*   No results for input(s): "LABPT", "INR" in the last 72 hours.  Assessment/Plan: Plan at this time is physical therapy this morning this afternoon.  Patient is highly motivated to go home today.  If he tolerates therapy well and then we should be able to discharge him this afternoon   G Dorene Grebe 01/18/2023, 7:56 AM

## 2023-01-18 NOTE — Progress Notes (Cosign Needed)
    Durable Medical Equipment  (From admission, onward)           Start     Ordered   01/18/23 1142  For home use only DME Bedside commode  Once       Comments: Pt confined to one room  Question:  Patient needs a bedside commode to treat with the following condition  Answer:  S/P total knee arthroplasty   01/18/23 1145

## 2023-01-18 NOTE — Plan of Care (Signed)

## 2023-01-18 NOTE — Progress Notes (Signed)
Physical Therapy Treatment Patient Details Name: James [name] MRN: [id number] DOB: July 06, 1946 Today's Date: 01/18/2023   History of Present Illness 77 y.o. male admitted 6/13 s/p  LEFT TOTAL KNEE ARTHROPLASTY. PMH: (HFpEF) heart failure with preserved ejection fraction   Acute lower GI bleeding after colonoscopy and polypectomy, resolved 12/12/2015  Ascending aortic aneurysm     CAD (coronary artery disease)    Cardiac murmur    Cataract    CHF (congestive heart failure)     Colon polyp    Constipation    Dysrhythmia    Hx A. Fib  Essential hypertension    Hemorrhoids    Hepatitis    Hx of adenomatous colonic polyps   Hyperlipidemia    Hypothyroidism    Mitral regurgitation    Osteoarthritis    Persistent atrial fibrillation (HCC)    PT Comments    Tolerated follow-up well with physical therapy this afternoon. Ambulates with min guard up to 85 feet utilizing walker for support. Still with some deficit of left knee motor control but able to self correct. Stable with knee immobilizer, encouraged to use until d/c by surgeon or PT at home follow-up for safety. Focused on gait symmetry, distance, and quad activation. Reviewed HEP handout, precautions for Lt knee extension, Iceman, and CPM use. Wife present and very supportive. Eager to go home. Adequate from a mobility standpoint to d/c once medially cleared. Will follow and progress until d/c.   Recommendations for follow up therapy are one component of a multi-disciplinary discharge planning process, led by the attending physician.  Recommendations may be updated based on patient status, additional functional criteria and insurance authorization.  Follow Up Recommendations       Assistance Recommended at Discharge Intermittent Supervision/Assistance  Patient can return home with the following A little help with walking and/or transfers;A little help with bathing/dressing/bathroom;Assistance with cooking/housework;Assist for transportation    Equipment Recommendations  None recommended by PT (Pt reports he has RW)    Recommendations for Other Services OT consult (per protocol)     Precautions / Restrictions Precautions Precautions: Knee Precaution Booklet Issued: Yes (comment) Precaution Comments: Reviewed Required Braces or Orthoses: Knee Immobilizer - Left Restrictions Weight Bearing Restrictions: Yes LLE Weight Bearing: Weight bearing as tolerated     Mobility  Bed Mobility Overal bed mobility: Needs Assistance Bed Mobility: Supine to Sit     Supine to sit: Min guard     General bed mobility comments: In recliner    Transfers Overall transfer level: Needs assistance Equipment used: Rolling walker (2 wheels) Transfers: Sit to/from Stand Sit to Stand: Min guard           General transfer comment: Min guard for safety cues for technique and hand placement. Stablizes with RW for support.    Ambulation/Gait Ambulation/Gait assistance: Min guard Gait Distance (Feet): 85 Feet Assistive device: Rolling walker (2 wheels) Gait Pattern/deviations: Step-to pattern, Decreased stride length, Decreased weight shift to left, Antalgic Gait velocity: decr Gait velocity interpretation: <1.31 ft/sec, indicative of household ambulator Pre-gait activities: lateral shift and march General Gait Details: Reviewed AD use with RW for support. Part of distance with KI and part without to assess knee control. Still showing some impaired motor control and needing to rely heavily on RW for support. Cues for sequencing, Lt quad activation in WB. Able to self correct, but instructed for awareness and safety with KI use moving forward until PT or surgeon D/c use.   Stairs  Wheelchair Mobility    Modified Rankin (Stroke Patients Only)       Balance Overall balance assessment: Needs assistance Sitting-balance support: No upper extremity supported, Feet supported Sitting balance-Leahy Scale: Normal      Standing balance support: Single extremity supported Standing balance-Leahy Scale: Poor                              Cognition Arousal/Alertness: Awake/alert Behavior During Therapy: WFL for tasks assessed/performed Overall Cognitive Status: Within Functional Limits for tasks assessed                                          Exercises Total Joint Exercises Ankle Circles/Pumps: AROM, Both, 10 reps, Supine Quad Sets: Strengthening, Both, 10 reps, Supine Heel Slides: AROM, Left, 5 reps, Seated Straight Leg Raises: AAROM, Left, 5 reps, Supine Long Arc Quad: AAROM, Left, 5 reps, Seated Knee Flexion: AAROM, Left, 5 reps, Seated Goniometric ROM: Approx 8-85 degrees Lt knee PROM    General Comments General comments (skin integrity, edema, etc.): Wife present and supportive. Reviewed precautions, iceman, KI, and CPM use.      Pertinent Vitals/Pain Pain Assessment Pain Assessment: 0-10 Pain Score: 10-Worst pain ever Pain Location: Left knee ambulating Pain Descriptors / Indicators: Aching, Grimacing, Guarding Pain Intervention(s): Limited activity within patient's tolerance, Monitored during session, Repositioned, Patient requesting pain meds-RN notified, RN gave pain meds during session, Ice applied    Home Living                          Prior Function            PT Goals (current goals can now be found in the care plan section) Acute Rehab PT Goals Patient Stated Goal: Go home today PT Goal Formulation: With patient Time For Goal Achievement: 01/22/23 Potential to Achieve Goals: Good Progress towards PT goals: Progressing toward goals    Frequency    7X/week      PT Plan Current plan remains appropriate    Co-evaluation              AM-PAC PT "6 Clicks" Mobility   Outcome Measure  Help needed turning from your back to your side while in a flat bed without using bedrails?: A Little Help needed moving from lying on  your back to sitting on the side of a flat bed without using bedrails?: A Little Help needed moving to and from a bed to a chair (including a wheelchair)?: A Little Help needed standing up from a chair using your arms (e.g., wheelchair or bedside chair)?: A Little Help needed to walk in hospital room?: A Little Help needed climbing 3-5 steps with a railing? : A Lot 6 Click Score: 17    End of Session Equipment Utilized During Treatment: Gait belt Activity Tolerance: Patient tolerated treatment well Patient left: in chair;with call bell/phone within reach;with chair alarm set;Other (comment) (Iceman applied, bone foam in place) Nurse Communication: Mobility status PT Visit Diagnosis: Unsteadiness on feet (R26.81);Other abnormalities of gait and mobility (R26.89);Difficulty in walking, not elsewhere classified (R26.2);Pain Pain - Right/Left: Left Pain - part of body: Knee     Time: 1610-9604 PT Time Calculation (min) (ACUTE ONLY): 26 min  Charges:  $Gait Training: 8-22 mins  Kathlyn Sacramento, PT, DPT [facility]  Rehabilitation Services Physical Therapist Office: [phone] Website: [email or web address]    Berton Mount 01/18/2023, 4:36 PM

## 2023-01-19 ENCOUNTER — Other Ambulatory Visit: Payer: Self-pay

## 2023-01-19 ENCOUNTER — Emergency Department (HOSPITAL_COMMUNITY)
Admission: EM | Admit: 2023-01-19 | Discharge: 2023-01-19 | Disposition: A | Discharge status: Home / Self Care | Payer: Medicare Other | Attending: Emergency Medicine | Admitting: Emergency Medicine

## 2023-01-19 ENCOUNTER — Emergency Department (HOSPITAL_COMMUNITY): Payer: Medicare Other

## 2023-01-19 DIAGNOSIS — I4819 Other persistent atrial fibrillation: Secondary | ICD-10-CM | POA: Insufficient documentation

## 2023-01-19 DIAGNOSIS — Z7901 Long term (current) use of anticoagulants: Secondary | ICD-10-CM | POA: Insufficient documentation

## 2023-01-19 DIAGNOSIS — Z79899 Other long term (current) drug therapy: Secondary | ICD-10-CM | POA: Insufficient documentation

## 2023-01-19 DIAGNOSIS — Z96652 Presence of left artificial knee joint: Secondary | ICD-10-CM | POA: Insufficient documentation

## 2023-01-19 DIAGNOSIS — E039 Hypothyroidism, unspecified: Secondary | ICD-10-CM | POA: Diagnosis not present

## 2023-01-19 DIAGNOSIS — R69 Illness, unspecified: Secondary | ICD-10-CM | POA: Diagnosis not present

## 2023-01-19 DIAGNOSIS — I110 Hypertensive heart disease with heart failure: Secondary | ICD-10-CM | POA: Insufficient documentation

## 2023-01-19 DIAGNOSIS — G8918 Other acute postprocedural pain: Secondary | ICD-10-CM | POA: Diagnosis not present

## 2023-01-19 DIAGNOSIS — M25562 Pain in left knee: Secondary | ICD-10-CM

## 2023-01-19 DIAGNOSIS — Z85828 Personal history of other malignant neoplasm of skin: Secondary | ICD-10-CM | POA: Diagnosis not present

## 2023-01-19 DIAGNOSIS — I2510 Atherosclerotic heart disease of native coronary artery without angina pectoris: Secondary | ICD-10-CM | POA: Insufficient documentation

## 2023-01-19 DIAGNOSIS — Z471 Aftercare following joint replacement surgery: Secondary | ICD-10-CM | POA: Diagnosis not present

## 2023-01-19 DIAGNOSIS — I5030 Unspecified diastolic (congestive) heart failure: Secondary | ICD-10-CM | POA: Insufficient documentation

## 2023-01-19 MED ORDER — KETOROLAC TROMETHAMINE 60 MG/2ML IM SOLN
30.0000 mg | Freq: Once | INTRAMUSCULAR | Status: AC
  Administered 2023-01-19: 30 mg via INTRAMUSCULAR
  Filled 2023-01-19: qty 2

## 2023-01-19 MED ORDER — LIDOCAINE 5 % EX PTCH: 1.0000 | MEDICATED_PATCH | CUTANEOUS | 0 refills | Status: DC

## 2023-01-19 MED ORDER — ACETAMINOPHEN 500 MG PO TABS
1000.0000 mg | ORAL_TABLET | Freq: Once | ORAL | Status: AC
  Administered 2023-01-19: 1000 mg via ORAL
  Filled 2023-01-19: qty 2

## 2023-01-19 MED ORDER — FENTANYL CITRATE PF 50 MCG/ML IJ SOSY
50.0000 ug | PREFILLED_SYRINGE | Freq: Once | INTRAMUSCULAR | Status: AC
  Administered 2023-01-19: 50 ug via INTRAMUSCULAR
  Filled 2023-01-19: qty 1

## 2023-01-19 NOTE — ED Notes (Signed)
This RN called ortho floor to see if floor was able to send dressing for pt's recent knee replacement as prior dressing was removed during provider assessment.  Ortho floor to send dressing.  Pt to be discharged after dressing placed.

## 2023-01-19 NOTE — ED Provider Notes (Signed)
[place] EMERGENCY DEPARTMENT AT [facility] Provider Note   CSN: [id number] Arrival date & time: 01/19/23  9629     History  Chief Complaint  Patient presents with   Knee Pain    James [name] is a 77 y.o. male.  The history is provided by the patient.  Knee Pain Location:  Knee Injury: no   Knee location:  L knee Pain details:    Quality:  Aching   Severity:  Severe   Onset quality:  Sudden   Timing:  Constant   Progression:  Unchanged Chronicity:  New Dislocation: no   Relieved by:  Nothing Worsened by:  Nothing Ineffective treatments: oxycodone. Associated symptoms: no fever   Risk factors: no concern for non-accidental trauma   Patient who is post op day 2 from a left total knee and got up this am from bed and had severe pain and called EMS for this.      Past Medical History:  Diagnosis Date   (HFpEF) heart failure with preserved ejection fraction (HCC)    a. 05/2018 Echo: EF 55-60%, gr2 DD.   Acute lower GI bleeding after colonoscopy and polypectomy, resolved 12/12/2015   Ascending aortic aneurysm (HCC)    a. 02/2018 Echo: mildly dil Ao root/asc ao/arch; b. 05/2018 CTA Chest: 4.7cm fusiform aneurysm of Asc Ao.   CAD (coronary artery disease)    a. 05/2018 Cath/PCI: LM nl,LAD 30p, 90/99d/apical, LCX 47m, OM1/2/3 min irregs, RCA 85p (3.5x18 [place] DES).   Cardiac murmur    a. 02/2018 Echo: EF 60-65%, no rwma, mild AI, mildly dil Ao root/Asc Ao/Arch, Mild MR, mildly dil LA. Nl RV fxn.   Cataract    bilateral surgery to remove   CHF (congestive heart failure) (HCC)    Colon polyp    Constipation    Dysrhythmia    Hx A. Fib   Essential hypertension    Hemorrhoids    Hepatitis    self resolved, likely food exposure, 1974.    History of cardiovascular stress test    a. 02/2018 Myoview: EF 55-65%, small/mild apical defect w/ nl wall motion ->attenuation artifact. No ischemia. Low risk study.   Hx of adenomatous colonic polyps 12/05/2015    Hyperlipidemia    Hypothyroidism    Mitral regurgitation    a. 110/2019 Echo: EF 55-60%. Gr2 DD. Triv AI. Ao root 38mm. Mild MR. Mod dil LA, mildly dil RA.   Osteoarthritis    Persistent atrial fibrillation (HCC)    a. Dx 02/2018. CHA2DS2VASc = 2-->Eliquis; b. 03/2018 DCCV (200J); c. 03/2018 recurrent Afib-->flecainide started 04/2018;  d. 05/06/2018 s/p successful DCCV - 200J x 2-->on amio.   Skin cancer    basal cell, R ear, MOHS     Home Medications Prior to Admission medications   Medication Sig Start Date End Date Taking? Authorizing Provider  lidocaine (LIDODERM) 5 % Place 1 patch onto the skin daily. Remove & Discard patch within 12 hours or as directed by MD 01/19/23  Yes Harrison Zetina, MD  acidophilus (RISAQUAD) CAPS capsule Take 1 capsule by mouth daily. Patient not taking: Reported on 01/17/2023    [provider]  apixaban (ELIQUIS) 5 MG TABS tablet Take 1 tablet (5 mg total) by mouth 2 (two) times daily. 08/31/22   Joaquim Nam, MD  atorvastatin (LIPITOR) 80 MG tablet TAKE 1 TABLET BY MOUTH DAILY AT [facility] 10/02/22   End, Cristal Deer, MD  carboxymethylcellulose (REFRESH PLUS) 0.5 % SOLN Place 1-2 drops into  both eyes 3 (three) times daily as needed (dry/irritated eyes.).    [provider]  diclofenac Sodium (VOLTAREN) 1 % GEL Apply 2 g topically 4 (four) times daily as needed. Patient taking differently: Apply 2 g topically daily. 09/05/20   Joaquim Nam, MD  docusate sodium (COLACE) 100 MG capsule Take 1 capsule (100 mg total) by mouth 2 (two) times daily. 01/18/23   Magnant, Charles L, PA-C  dofetilide (TIKOSYN) 500 MCG capsule Take 1 capsule (500 mcg total) by mouth 2 (two) times daily. 08/15/22   Newman Nip, NP  doxazosin (CARDURA) 4 MG tablet TAKE 1 TABLET BY MOUTH DAILY AFTER BREAKFAST 10/02/22   Joaquim Nam, MD  ezetimibe (ZETIA) 10 MG tablet TAKE 1 TABLET BY MOUTH DAILY 01/14/23   End, Cristal Deer, MD  finasteride (PROSCAR) 5 MG tablet TAKE 1  TABLET BY MOUTH EVERY DAY 09/26/22   Joaquim Nam, MD  furosemide (LASIX) 20 MG tablet TAKE 1 TABLET BY MOUTH EVERY OTHER DAY, GENERIC EQUIVALENT FOR LASIX 12/04/22   Joaquim Nam, MD  levothyroxine (SYNTHROID) 125 MCG tablet TAKE 1 TABLET BY MOUTH DAILY BEFORE BREAKFAST. Patient taking differently: Take 125 mcg by mouth See admin instructions. Take 1 tablet (125 mcg) by mouth on Mondays, Tuesdays, Wednesdays, Thursdays, Fridays and Saturdays ONLY. SKIP SUNDAYS. (0700) 09/26/22   Joaquim Nam, MD  methocarbamol (ROBAXIN) 500 MG tablet Take 1 tablet (500 mg total) by mouth every 8 (eight) hours as needed for muscle spasms. 01/18/23   Magnant, Charles L, PA-C  metoprolol succinate (TOPROL XL) 25 MG 24 hr tablet Take 1 tablet (25 mg total) by mouth daily. 05/25/22   End, Cristal Deer, MD  Multiple Vitamin (MULTIVITAMIN WITH MINERALS) TABS tablet Take 1 tablet by mouth daily. Senior Multivitamin    [provider]  oxyCODONE (OXY IR/ROXICODONE) 5 MG immediate release tablet Take 1 tablet (5 mg total) by mouth every 4 (four) hours as needed for moderate pain (pain score 4-6). 01/18/23   Magnant, Charles L, PA-C  psyllium (METAMUCIL) 58.6 % packet Take 1 packet by mouth daily as needed (constipation).    [provider]  ramipril (ALTACE) 10 MG capsule TAKE ONE CAPSULE BY MOUTH DAILY. GENERIC EQUIVALENT FOR ALTACE 10/02/22   Joaquim Nam, MD      Allergies    Sulfonamide derivatives    Review of Systems   Review of Systems  Constitutional:  Negative for fever.  Respiratory:  Negative for shortness of breath.   Cardiovascular:  Negative for chest pain.  Musculoskeletal:  Positive for arthralgias.  All other systems reviewed and are negative.   Physical Exam Updated Vital Signs BP (!) 152/84 (BP Location: Right Arm)   Pulse 79   Temp 98.2 F (36.8 C) (Oral)   Resp 17   Ht 6\' 1"  (1.854 m)   Wt 113.4 kg   SpO2 98%   BMI 32.98 kg/m  Physical Exam Vitals and  nursing note reviewed. Exam conducted with a chaperone present.  Constitutional:      General: He is not in acute distress.    Appearance: Normal appearance. He is well-developed. He is not diaphoretic.  HENT:     Head: Normocephalic and atraumatic.     Nose: Nose normal.  Eyes:     Conjunctiva/sclera: Conjunctivae normal.     Pupils: Pupils are equal, round, and reactive to light.  Cardiovascular:     Rate and Rhythm: Normal rate and regular rhythm.  Pulmonary:  Effort: Pulmonary effort is normal.     Breath sounds: Normal breath sounds. No wheezing or rales.  Abdominal:     General: Bowel sounds are normal.     Palpations: Abdomen is soft.     Tenderness: There is no abdominal tenderness. There is no guarding or rebound.  Musculoskeletal:        General: Swelling present. Normal range of motion.     Cervical back: Normal range of motion and neck supple.     Left upper leg: Normal.     Left knee: Swelling and effusion present. No erythema, ecchymosis or crepitus.     Left ankle: Normal.     Left Achilles Tendon: Normal.       Legs:     Comments: All compartments of the calf and shin are soft   Skin:    General: Skin is warm and dry.     Capillary Refill: Capillary refill takes less than 2 seconds.  Neurological:     General: No focal deficit present.     Mental Status: He is alert and oriented to person, place, and time.     Deep Tendon Reflexes: Reflexes normal.  Psychiatric:        Mood and Affect: Mood normal.     ED Results / Procedures / Treatments   Labs (all labs ordered are listed, but only abnormal results are displayed) Labs Reviewed - No data to display  EKG None  Radiology No results found.  Procedures Procedures    Medications Ordered in ED Medications  ketorolac (TORADOL) injection 30 mg (has no administration in time range)  fentaNYL (SUBLIMAZE) injection 50 mcg (50 mcg Intramuscular Given 01/19/23 0544)  acetaminophen (TYLENOL) tablet  1,000 mg (1,000 mg Oral Given 01/19/23 0544)    ED Course/ Medical Decision Making/ A&P                             Medical Decision Making Patient with post op day 2 from knee replacement with pain with standing   Amount and/or Complexity of Data Reviewed Independent Historian: spouse    Details: See above  External Data Reviewed: notes.    Details: Previous reviewed  Radiology: ordered.    Details: Knee with hardware in expected position  Discussion of management or test interpretation with external provider(s): Case d/w Dr. Roda Shutters on call for Dr. August Saucer on dose of Toradol is ok at this point.  Numbing is wearing off and that is why pain is intense.  May add tylenol to regimen   Risk OTC drugs. Prescription drug management. Risk Details: Given fentanyl and toradol for pain.  I have discussed the patient's care with on call orthopedics and have instructed follow up this week.  Stable for discharge.  Have explained to add tylenol and lidoderm.  RX for lidoderm sent to pharmacy.      Final Clinical Impression(s) / ED Diagnoses Final diagnoses:  Post-operative pain   Return for intractable cough, coughing up blood, fevers > 100.4 unrelieved by medication, shortness of breath, intractable vomiting, chest pain, shortness of breath, weakness, numbness, changes in speech, facial asymmetry, abdominal pain, passing out, Inability to tolerate liquids or food, cough, altered mental status or any concerns. No signs of systemic illness or infection. The patient is nontoxic-appearing on exam and vital signs are within normal limits.  I have reviewed the triage vital signs and the nursing notes. Pertinent labs & imaging results that were  available during my care of the patient were reviewed by me and considered in my medical decision making (see chart for details). After history, exam, and medical workup I feel the patient has been appropriately medically screened and is safe for discharge home. Pertinent  diagnoses were discussed with the patient. Patient was given return precautions Rx / DC Orders ED Discharge Orders          Ordered    lidocaine (LIDODERM) 5 %  Every 24 hours        01/19/23 0610              Janila Arrazola, MD 01/19/23 1610

## 2023-01-19 NOTE — ED Triage Notes (Addendum)
Chief Complaint  Patient presents with   Knee Pain   Pt presents to ED 30 via EMS from home with above complaint.  Pt had knee replacement surgery Thursday; discharged with muscle relaxer and Oxycodone.  Pt woke up this morning around 0300 with spasms and cramping; states he took his muscle relaxer and Oxycodone with no relief.  States he was unable to get out of bed due to pain.

## 2023-01-20 ENCOUNTER — Emergency Department: Payer: Medicare Other

## 2023-01-20 ENCOUNTER — Observation Stay
Admission: EM | Admit: 2023-01-20 | Discharge: 2023-01-21 | Disposition: A | Discharge status: Home / Self Care | Payer: Medicare Other | Attending: Internal Medicine | Admitting: Internal Medicine

## 2023-01-20 ENCOUNTER — Other Ambulatory Visit: Payer: Self-pay

## 2023-01-20 DIAGNOSIS — E785 Hyperlipidemia, unspecified: Secondary | ICD-10-CM | POA: Diagnosis present

## 2023-01-20 DIAGNOSIS — Z79890 Hormone replacement therapy (postmenopausal): Secondary | ICD-10-CM | POA: Diagnosis not present

## 2023-01-20 DIAGNOSIS — I4819 Other persistent atrial fibrillation: Secondary | ICD-10-CM | POA: Diagnosis not present

## 2023-01-20 DIAGNOSIS — N401 Enlarged prostate with lower urinary tract symptoms: Secondary | ICD-10-CM | POA: Diagnosis not present

## 2023-01-20 DIAGNOSIS — M25562 Pain in left knee: Secondary | ICD-10-CM | POA: Diagnosis not present

## 2023-01-20 DIAGNOSIS — I7121 Aneurysm of the ascending aorta, without rupture: Secondary | ICD-10-CM | POA: Diagnosis not present

## 2023-01-20 DIAGNOSIS — R6510 Systemic inflammatory response syndrome (SIRS) of non-infectious origin without acute organ dysfunction: Secondary | ICD-10-CM | POA: Diagnosis not present

## 2023-01-20 DIAGNOSIS — I110 Hypertensive heart disease with heart failure: Secondary | ICD-10-CM | POA: Diagnosis not present

## 2023-01-20 DIAGNOSIS — Z8249 Family history of ischemic heart disease and other diseases of the circulatory system: Secondary | ICD-10-CM | POA: Diagnosis not present

## 2023-01-20 DIAGNOSIS — I700 Atherosclerosis of aorta: Secondary | ICD-10-CM | POA: Diagnosis present

## 2023-01-20 DIAGNOSIS — Y838 Other surgical procedures as the cause of abnormal reaction of the patient, or of later complication, without mention of misadventure at the time of the procedure: Secondary | ICD-10-CM | POA: Insufficient documentation

## 2023-01-20 DIAGNOSIS — E860 Dehydration: Secondary | ICD-10-CM | POA: Diagnosis not present

## 2023-01-20 DIAGNOSIS — T50905A Adverse effect of unspecified drugs, medicaments and biological substances, initial encounter: Secondary | ICD-10-CM | POA: Diagnosis present

## 2023-01-20 DIAGNOSIS — M009 Pyogenic arthritis, unspecified: Secondary | ICD-10-CM | POA: Insufficient documentation

## 2023-01-20 DIAGNOSIS — I4891 Unspecified atrial fibrillation: Secondary | ICD-10-CM | POA: Diagnosis present

## 2023-01-20 DIAGNOSIS — Z823 Family history of stroke: Secondary | ICD-10-CM

## 2023-01-20 DIAGNOSIS — R531 Weakness: Secondary | ICD-10-CM | POA: Diagnosis not present

## 2023-01-20 DIAGNOSIS — R0602 Shortness of breath: Secondary | ICD-10-CM | POA: Diagnosis not present

## 2023-01-20 DIAGNOSIS — R0902 Hypoxemia: Secondary | ICD-10-CM | POA: Diagnosis not present

## 2023-01-20 DIAGNOSIS — Z7901 Long term (current) use of anticoagulants: Secondary | ICD-10-CM

## 2023-01-20 DIAGNOSIS — G4733 Obstructive sleep apnea (adult) (pediatric): Secondary | ICD-10-CM | POA: Diagnosis not present

## 2023-01-20 DIAGNOSIS — Z955 Presence of coronary angioplasty implant and graft: Secondary | ICD-10-CM | POA: Diagnosis not present

## 2023-01-20 DIAGNOSIS — T8484XA Pain due to internal orthopedic prosthetic devices, implants and grafts, initial encounter: Secondary | ICD-10-CM | POA: Diagnosis not present

## 2023-01-20 DIAGNOSIS — R55 Syncope and collapse: Secondary | ICD-10-CM | POA: Diagnosis not present

## 2023-01-20 DIAGNOSIS — R310 Gross hematuria: Secondary | ICD-10-CM | POA: Diagnosis not present

## 2023-01-20 DIAGNOSIS — Z85828 Personal history of other malignant neoplasm of skin: Secondary | ICD-10-CM

## 2023-01-20 DIAGNOSIS — I10 Essential (primary) hypertension: Secondary | ICD-10-CM | POA: Diagnosis present

## 2023-01-20 DIAGNOSIS — Z9842 Cataract extraction status, left eye: Secondary | ICD-10-CM

## 2023-01-20 DIAGNOSIS — Z87891 Personal history of nicotine dependence: Secondary | ICD-10-CM | POA: Diagnosis not present

## 2023-01-20 DIAGNOSIS — Z882 Allergy status to sulfonamides status: Secondary | ICD-10-CM

## 2023-01-20 DIAGNOSIS — E039 Hypothyroidism, unspecified: Secondary | ICD-10-CM | POA: Diagnosis present

## 2023-01-20 DIAGNOSIS — A419 Sepsis, unspecified organism: Secondary | ICD-10-CM

## 2023-01-20 DIAGNOSIS — I509 Heart failure, unspecified: Secondary | ICD-10-CM

## 2023-01-20 DIAGNOSIS — R7989 Other specified abnormal findings of blood chemistry: Secondary | ICD-10-CM | POA: Diagnosis not present

## 2023-01-20 DIAGNOSIS — Z96652 Presence of left artificial knee joint: Secondary | ICD-10-CM

## 2023-01-20 DIAGNOSIS — Z1152 Encounter for screening for COVID-19: Secondary | ICD-10-CM

## 2023-01-20 DIAGNOSIS — R6520 Severe sepsis without septic shock: Secondary | ICD-10-CM | POA: Diagnosis not present

## 2023-01-20 DIAGNOSIS — R319 Hematuria, unspecified: Secondary | ICD-10-CM | POA: Diagnosis present

## 2023-01-20 DIAGNOSIS — N179 Acute kidney failure, unspecified: Secondary | ICD-10-CM | POA: Insufficient documentation

## 2023-01-20 DIAGNOSIS — R0609 Other forms of dyspnea: Secondary | ICD-10-CM | POA: Diagnosis present

## 2023-01-20 DIAGNOSIS — I2510 Atherosclerotic heart disease of native coronary artery without angina pectoris: Secondary | ICD-10-CM | POA: Diagnosis present

## 2023-01-20 DIAGNOSIS — I5032 Chronic diastolic (congestive) heart failure: Secondary | ICD-10-CM | POA: Diagnosis not present

## 2023-01-20 DIAGNOSIS — M25462 Effusion, left knee: Secondary | ICD-10-CM | POA: Diagnosis present

## 2023-01-20 DIAGNOSIS — Z9841 Cataract extraction status, right eye: Secondary | ICD-10-CM

## 2023-01-20 DIAGNOSIS — R351 Nocturia: Secondary | ICD-10-CM | POA: Diagnosis not present

## 2023-01-20 DIAGNOSIS — K449 Diaphragmatic hernia without obstruction or gangrene: Secondary | ICD-10-CM | POA: Diagnosis present

## 2023-01-20 DIAGNOSIS — R42 Dizziness and giddiness: Secondary | ICD-10-CM | POA: Diagnosis not present

## 2023-01-20 DIAGNOSIS — R0600 Dyspnea, unspecified: Secondary | ICD-10-CM | POA: Diagnosis not present

## 2023-01-20 DIAGNOSIS — I959 Hypotension, unspecified: Secondary | ICD-10-CM | POA: Diagnosis not present

## 2023-01-20 DIAGNOSIS — R600 Localized edema: Secondary | ICD-10-CM | POA: Diagnosis not present

## 2023-01-20 DIAGNOSIS — Z79899 Other long term (current) drug therapy: Secondary | ICD-10-CM

## 2023-01-20 LAB — BASIC METABOLIC PANEL
Anion gap: 11 (ref 5–15)
BUN: 30 mg/dL — ABNORMAL HIGH (ref 8–23)
CO2: 22 mmol/L (ref 22–32)
Calcium: 9.1 mg/dL (ref 8.9–10.3)
Chloride: 104 mmol/L (ref 98–111)
Creatinine, Ser: 1.7 mg/dL — ABNORMAL HIGH (ref 0.61–1.24)
GFR, Estimated: 42 mL/min — ABNORMAL LOW (ref >60)
Glucose, Bld: 139 mg/dL — ABNORMAL HIGH (ref 70–99)
Potassium: 3.7 mmol/L (ref 3.5–5.1)
Sodium: 137 mmol/L (ref 135–145)

## 2023-01-20 LAB — CBC WITH DIFFERENTIAL/PLATELET
Abs Immature Granulocytes: 0.06 10*3/uL (ref 0.00–0.07)
Basophils Absolute: 0 10*3/uL (ref 0.0–0.1)
Basophils Relative: 0 %
Eosinophils Absolute: 0 10*3/uL (ref 0.0–0.5)
Eosinophils Relative: 0 %
HCT: 35.6 % — ABNORMAL LOW (ref 39.0–52.0)
Hemoglobin: 11.6 g/dL — ABNORMAL LOW (ref 13.0–17.0)
Immature Granulocytes: 1 %
Lymphocytes Relative: 9 %
Lymphs Abs: 1 10*3/uL (ref 0.7–4.0)
MCH: 30.8 pg (ref 26.0–34.0)
MCHC: 32.6 g/dL (ref 30.0–36.0)
MCV: 94.4 fL (ref 80.0–100.0)
Monocytes Absolute: 0.8 10*3/uL (ref 0.1–1.0)
Monocytes Relative: 7 %
Neutro Abs: 9.6 10*3/uL — ABNORMAL HIGH (ref 1.7–7.7)
Neutrophils Relative %: 83 %
Platelets: 153 10*3/uL (ref 150–400)
RBC: 3.77 MIL/uL — ABNORMAL LOW (ref 4.22–5.81)
RDW: 13.3 % (ref 11.5–15.5)
WBC: 11.5 10*3/uL — ABNORMAL HIGH (ref 4.0–10.5)
nRBC: 0 % (ref 0.0–0.2)

## 2023-01-20 LAB — URINALYSIS, ROUTINE W REFLEX MICROSCOPIC
Bacteria, UA: NONE SEEN
Bilirubin Urine: NEGATIVE
Glucose, UA: NEGATIVE mg/dL
Ketones, ur: NEGATIVE mg/dL
Leukocytes,Ua: NEGATIVE
Nitrite: NEGATIVE
Protein, ur: NEGATIVE mg/dL
RBC / HPF: >50 RBC/hpf (ref 0–5)
Specific Gravity, Urine: 1.021 (ref 1.005–1.030)
pH: 6 (ref 5.0–8.0)

## 2023-01-20 LAB — LACTIC ACID, PLASMA
Lactic Acid, Venous: 4.3 mmol/L (ref 0.5–1.9)
Lactic Acid, Venous: 1.7 mmol/L (ref 0.5–1.9)

## 2023-01-20 LAB — RESP PANEL BY RT-PCR (FLU A&B, COVID) ARPGX2
Influenza A by PCR: NEGATIVE
Influenza B by PCR: NEGATIVE
SARS Coronavirus 2 by RT PCR: NEGATIVE

## 2023-01-20 LAB — TROPONIN I (HIGH SENSITIVITY)
Troponin I (High Sensitivity): 10 ng/L (ref <18)
Troponin I (High Sensitivity): 11 ng/L (ref <18)

## 2023-01-20 LAB — BRAIN NATRIURETIC PEPTIDE: B Natriuretic Peptide: 55.3 pg/mL (ref 0.0–100.0)

## 2023-01-20 LAB — PROCALCITONIN: Procalcitonin: 0.17 ng/mL

## 2023-01-20 MED ORDER — MORPHINE SULFATE (PF) 2 MG/ML IV SOLN: 2.0000 mg | INTRAVENOUS | Status: AC | PRN

## 2023-01-20 MED ORDER — METHOCARBAMOL 500 MG PO TABS: 500.0000 mg | ORAL_TABLET | Freq: Three times a day (TID) | ORAL | Status: DC | PRN

## 2023-01-20 MED ORDER — SODIUM CHLORIDE 0.9 % IV BOLUS
500.0000 mL | Freq: Once | INTRAVENOUS | Status: AC
  Administered 2023-01-20: 500 mL via INTRAVENOUS

## 2023-01-20 MED ORDER — SODIUM CHLORIDE 0.9 % IV SOLN: 2.0000 g | INTRAVENOUS | Status: DC

## 2023-01-20 MED ORDER — SENNOSIDES-DOCUSATE SODIUM 8.6-50 MG PO TABS: 1.0000 | ORAL_TABLET | Freq: Every evening | ORAL | Status: DC | PRN

## 2023-01-20 MED ORDER — HEPARIN SODIUM (PORCINE) 5000 UNIT/ML IJ SOLN: 5000.0000 [IU] | Freq: Three times a day (TID) | INTRAMUSCULAR | Status: DC

## 2023-01-20 MED ORDER — LEVOTHYROXINE SODIUM 125 MCG PO TABS
125.0000 ug | ORAL_TABLET | ORAL | Status: DC
  Administered 2023-01-21: 125 ug via ORAL
  Filled 2023-01-20: qty 1

## 2023-01-20 MED ORDER — SODIUM CHLORIDE 0.9 % IV SOLN
2.0000 g | INTRAVENOUS | Status: DC
  Administered 2023-01-20: 2 g via INTRAVENOUS
  Filled 2023-01-20: qty 20

## 2023-01-20 MED ORDER — OXYCODONE HCL 5 MG PO TABS: 5.0000 mg | ORAL_TABLET | Freq: Four times a day (QID) | ORAL | Status: DC | PRN

## 2023-01-20 MED ORDER — ONDANSETRON HCL 4 MG/2ML IJ SOLN: 4.0000 mg | Freq: Four times a day (QID) | INTRAMUSCULAR | Status: DC | PRN

## 2023-01-20 MED ORDER — HYDRALAZINE HCL 20 MG/ML IJ SOLN: 5.0000 mg | Freq: Three times a day (TID) | INTRAMUSCULAR | Status: DC | PRN

## 2023-01-20 MED ORDER — EZETIMIBE 10 MG PO TABS
10.0000 mg | ORAL_TABLET | Freq: Every day | ORAL | Status: DC
  Administered 2023-01-21: 10 mg via ORAL
  Filled 2023-01-20: qty 1

## 2023-01-20 MED ORDER — POLYVINYL ALCOHOL 1.4 % OP SOLN: 1.0000 [drp] | Freq: Three times a day (TID) | OPHTHALMIC | Status: DC | PRN

## 2023-01-20 MED ORDER — DOCUSATE SODIUM 100 MG PO CAPS
100.0000 mg | ORAL_CAPSULE | Freq: Two times a day (BID) | ORAL | Status: DC
  Filled 2023-01-20: qty 1

## 2023-01-20 MED ORDER — ATORVASTATIN CALCIUM 20 MG PO TABS: 80.0000 mg | ORAL_TABLET | Freq: Every day | ORAL | Status: DC

## 2023-01-20 MED ORDER — PSYLLIUM 95 % PO PACK: 1.0000 | PACK | Freq: Every day | ORAL | Status: DC | PRN

## 2023-01-20 MED ORDER — SODIUM CHLORIDE 0.9 % IV BOLUS
1000.0000 mL | Freq: Once | INTRAVENOUS | Status: AC
  Administered 2023-01-20: 1000 mL via INTRAVENOUS

## 2023-01-20 MED ORDER — DOXAZOSIN MESYLATE 4 MG PO TABS
4.0000 mg | ORAL_TABLET | Freq: Every day | ORAL | Status: DC
  Administered 2023-01-21: 4 mg via ORAL
  Filled 2023-01-20: qty 1

## 2023-01-20 MED ORDER — DICLOFENAC SODIUM 1 % EX GEL: 2.0000 g | Freq: Four times a day (QID) | CUTANEOUS | Status: DC | PRN

## 2023-01-20 MED ORDER — IOHEXOL 350 MG/ML SOLN
60.0000 mL | Freq: Once | INTRAVENOUS | Status: AC | PRN
  Administered 2023-01-20: 60 mL via INTRAVENOUS

## 2023-01-20 MED ORDER — FINASTERIDE 5 MG PO TABS
5.0000 mg | ORAL_TABLET | Freq: Every day | ORAL | Status: DC
  Administered 2023-01-21: 5 mg via ORAL
  Filled 2023-01-20: qty 1

## 2023-01-20 MED ORDER — ONDANSETRON HCL 4 MG PO TABS: 4.0000 mg | ORAL_TABLET | Freq: Four times a day (QID) | ORAL | Status: DC | PRN

## 2023-01-20 MED ORDER — METOPROLOL SUCCINATE ER 25 MG PO TB24
25.0000 mg | ORAL_TABLET | Freq: Every day | ORAL | Status: DC
  Administered 2023-01-21: 25 mg via ORAL
  Filled 2023-01-20: qty 1

## 2023-01-20 MED ORDER — MELATONIN 5 MG PO TABS: 5.0000 mg | ORAL_TABLET | Freq: Every evening | ORAL | Status: DC | PRN

## 2023-01-20 MED ORDER — DOFETILIDE 250 MCG PO CAPS
500.0000 ug | ORAL_CAPSULE | Freq: Two times a day (BID) | ORAL | Status: DC
  Administered 2023-01-21: 500 ug via ORAL
  Filled 2023-01-20 (×3): qty 2

## 2023-01-20 MED ORDER — ACETAMINOPHEN 650 MG RE SUPP: 650.0000 mg | Freq: Four times a day (QID) | RECTAL | Status: DC | PRN

## 2023-01-20 MED ORDER — ACETAMINOPHEN 325 MG PO TABS: 650.0000 mg | ORAL_TABLET | Freq: Four times a day (QID) | ORAL | Status: DC | PRN

## 2023-01-20 NOTE — Assessment & Plan Note (Addendum)
Query prerenal versus intrarenal in setting of possible sepsis versus septic joint Status post sodium chloride 1.5 L bolus ordered by EDP pending complete administration Holding home ramipril 10 mg daily, AM resume when appropriate Repeat BMP in the a.m.

## 2023-01-20 NOTE — ED Provider Notes (Signed)
[facility] Provider Note    Event Date/Time   First MD Initiated Contact with Patient 01/20/23 1115     (approximate)   History   Near Syncope   HPI  James [name] is a 77 y.o. male he has 3 days postop left knee replacement   On review of previous H&P denotes history of atrial fibrillation thoracic aneurysm without rupture, heart failure with preserved EF, coronary disease unstable angina and a recent left knee replacement with medications including Eliquis which is currently active. Tikosyn  Today patient got up and felt lightheaded.  Got up to move about the house and as he was doing so he started feeling lightheaded and short of breath.  He had to stop, did not fall or pass out, but felt very weak as though he was going to pass out.  Typically reports that he stands up he might feel lightheaded briefly, but in this case that persisted for fair amount of time.  He did not feel like he could recover and continue to feel lightheaded.  No chest pain.  He reports his knee pains been well-controlled.  Yesterday felt like he was having cramps or spasms in many of his muscle so he went to urgent care to get evaluated.  He resumed his anticoagulant yesterday.  He did notice his urine seemed to have a little bit of a blood tinge to it this morning  Physical Exam   Triage Vital Signs: ED Triage Vitals  Enc Vitals Group     BP      Pulse      Resp      Temp      Temp src      SpO2      Weight      Height      Head Circumference      Peak Flow      Pain Score      Pain Loc      Pain Edu?      Excl. in GC?     Most recent vital signs: Vitals:   01/20/23 1200 01/20/23 1230  BP: 112/65 103/62  Pulse: 78 75  Resp: 16 10  Temp:    SpO2: 100% 100%     General: Awake, no distress.  Appears slightly pale in complexion and a bit generally fatigued but in no distress.  Blood pressure 113 systolic on recheck This membranes moist to slightly  dry CV:  Good peripheral perfusion.  Normal tones and rate Resp:  Normal effort.  Lungs are clear bilaterally Abd:  No distention.  Soft nontender nondistended Other:  Moderate extremity edema in the left lower extremity.  Left anterior knee site appears clean dry and intact, I did take down the upper portion of his dressing and it does not appear to be erythematous or with any drainage.  He reports the entire dressing was removed and replaced yesterday at the ER after it was inspected and at that point was also told it looked very good.  No surrounding erythema. Right lower extremity appears atraumatic without associated significant edema or swelling.  Left  lower extremity slightly more swollen than right   ED Results / Procedures / Treatments   Labs (all labs ordered are listed, but only abnormal results are displayed) Labs Reviewed  CBC WITH DIFFERENTIAL/PLATELET - Abnormal; Notable for the following components:      Result Value   WBC 11.5 (*)    RBC 3.77 (*)  Hemoglobin 11.6 (*)    HCT 35.6 (*)    Neutro Abs 9.6 (*)    All other components within normal limits  BASIC METABOLIC PANEL - Abnormal; Notable for the following components:   Glucose, Bld 139 (*)    BUN 30 (*)    Creatinine, Ser 1.70 (*)    GFR, Estimated 42 (*)    All other components within normal limits  URINALYSIS, ROUTINE W REFLEX MICROSCOPIC - Abnormal; Notable for the following components:   Color, Urine YELLOW (*)    APPearance CLEAR (*)    Hgb urine dipstick LARGE (*)    All other components within normal limits  RESP PANEL BY RT-PCR (FLU A&B, COVID) ARPGX2  BRAIN NATRIURETIC PEPTIDE  PROCALCITONIN  LACTIC ACID, PLASMA  LACTIC ACID, PLASMA  TROPONIN I (HIGH SENSITIVITY)  TROPONIN I (HIGH SENSITIVITY)     EKG  And interpreted by me at 1120 heart rate 80 QRS 100 QTc 480 Normal sinus rhythm.  Q waves present in inferior distribution.  Including lead III and aVF.  No evidence of STEMI or obvious frank  ischemia noted at this time   RADIOLOGY  Chest x-ray interpreted by me as normal negative for acute   CT Angio Chest PE W and/or Wo Contrast  Result Date: 01/20/2023 CLINICAL DATA:  Pulmonary embolism suspected, high probability. Lightheadedness and shortness of breath. EXAM: CT ANGIOGRAPHY CHEST WITH CONTRAST TECHNIQUE: Multidetector CT imaging of the chest was performed using the standard protocol during bolus administration of intravenous contrast. Multiplanar CT image reconstructions and MIPs were obtained to evaluate the vascular anatomy. RADIATION DOSE REDUCTION: This exam was performed according to the departmental dose-optimization program which includes automated exposure control, adjustment of the mA and/or kV according to patient size and/or use of iterative reconstruction technique. CONTRAST:  60mL OMNIPAQUE IOHEXOL 350 MG/ML SOLN COMPARISON:  CT angio chest 12/06/2022 FINDINGS: Cardiovascular: The heart size is normal. Coronary artery calcifications are present. The ascending thoracic aorta is stable measuring 4.7 cm. Atherosclerotic calcifications are present at the aortic arch and great vessel origins without significant focal stenosis. Mediastinum/Nodes: Pulmonary artery opacification is excellent. No focal filling defects are present to suggest pulmonary embolus. Pulmonary artery size is normal. Lungs/Pleura: Lungs are clear. No pleural effusion or pneumothorax. Upper Abdomen: Limited imaging the abdomen is unremarkable. There is no significant adenopathy. No solid organ lesions are present. A small hiatal hernia is present. Musculoskeletal: No chest wall abnormality. No acute or significant osseous findings. Review of the MIP images confirms the above findings. IMPRESSION: 1. No pulmonary embolus. 2. No acute or focal lesion to explain the patient's symptoms. 3. Stable 4.7 cm ascending thoracic aortic aneurysm. Ascending thoracic aortic aneurysm. Recommend semi-annual imaging followup by  CTA or MRA and referral to cardiothoracic surgery if not already obtained. This recommendation follows 2010 ACCF/AHA/AATS/ACR/ASA/SCA/SCAI/SIR/STS/SVM Guidelines for the Diagnosis and Management of Patients With Thoracic Aortic Disease. Circulation. 2010; 121: B147-W295. Aortic aneurysm NOS (ICD10-I71.9) Coronary artery disease. 4. Small hiatal hernia. 5.  Aortic Atherosclerosis (ICD10-I70.0). Electronically Signed   By: Marin Roberts M.D.   On: 01/20/2023 13:22    PROCEDURES:  Critical Care performed: No  Procedures   MEDICATIONS ORDERED IN ED: Medications  oxyCODONE (Oxy IR/ROXICODONE) immediate release tablet 5 mg (has no administration in time range)  acetaminophen (TYLENOL) tablet 650 mg (has no administration in time range)    Or  acetaminophen (TYLENOL) suppository 650 mg (has no administration in time range)  ondansetron (ZOFRAN) tablet 4 mg (has no  administration in time range)    Or  ondansetron (ZOFRAN) injection 4 mg (has no administration in time range)  heparin injection 5,000 Units (has no administration in time range)  senna-docusate (Senokot-S) tablet 1 tablet (has no administration in time range)  hydrALAZINE (APRESOLINE) injection 5 mg (has no administration in time range)  melatonin tablet 5 mg (has no administration in time range)  iohexol (OMNIPAQUE) 350 MG/ML injection 60 mL (60 mLs Intravenous Contrast Given 01/20/23 1243)  sodium chloride 0.9 % bolus 500 mL (0 mLs Intravenous Stopped 01/20/23 1359)  sodium chloride 0.9 % bolus 1,000 mL (1,000 mLs Intravenous New Bag/Given 01/20/23 1441)     IMPRESSION / MDM / ASSESSMENT AND PLAN / ED COURSE  I reviewed the triage vital signs and the nursing notes.                              Differential diagnosis includes, but is not limited to, possible causes such as CHF exacerbation, though he appears slightly euvolemic to mildly hypovolemic, dehydration, electrolyte imbalance, acute anemia, infection, etc.  No  chest pain.  No obvious symptoms of palpitations, does carry a history of congestive heart failure though and a near syncopal episode today which is concerning especially in the setting of recent postoperative care.  His left leg is swollen but somewhat this is expected after surgery and he is now anticoagulated, but given his associated dyspnea near syncope recent surgery left leg swelling I think it is prudent to exclude PE with imaging of the chest including CT which I have ordered.  Continue cardiac workup, etc  No obvious evidence of infection about the surgical site.  Chest x-ray clear.  He does report a mild amount of gross hematuria noted this morning.  Will check urinalysis.  Patient's presentation is most consistent with acute complicated illness / injury requiring diagnostic workup.   The patient is on the cardiac monitor to evaluate for evidence of arrhythmia and/or significant heart rate changes.  Labs interpreted by me as mild leukocytosis mild reduction in baseline hemoglobin.  Additionally his creatinine is mildly elevated with evidence of potential mild AKI.  Multiple but no major lab abnormalities to noted.  Will hydrate given he appears euvolemic to slightly dehydrated   ----------------------------------------- 3:14 PM on 01/20/2023 ----------------------------------------- Patient was able to urinate somewhat small-volume somewhat dark urine.  It is notable for large amount of hemoglobin without clear evidence of infection though some white blood cells are seen and no nitrates no leukocytes and no bacteria.  Discussed with our hospitalist, consulted with Dr. Sedalia Muta.  At that time urinalysis was pending but was discussed that the patient may have hematuria that will be followed up by the hospitalist service.  At this juncture given his near syncopal episode recent surgery, and albeit mild but present hypotension I suspect an element of dehydration and will hydrate, but given his  recent surgical course near syncope and hematuria, will admit for further workup to the hospitalist.  Patient and his wife both understanding very agreeable with the plan   FINAL CLINICAL IMPRESSION(S) / ED DIAGNOSES   Final diagnoses:  Near syncope  Weakness  Dehydration  Gross hematuria     Rx / DC Orders   ED Discharge Orders     None        Note:  This document was prepared using Dragon voice recognition software and may include unintentional dictation errors.   Ricarda Atayde,  Loraine Leriche, MD 01/20/23 [phone]

## 2023-01-20 NOTE — Assessment & Plan Note (Addendum)
Etiology workup in progress Given recent left knee replacement on 01/17/2023, with mild leukocytosis, infectious etiology cannot be excluded at this time Check lactic acid on admission, procalcitonin If lactic acid is elevated, we will proceed with blood cultures x 2 Septic left knee joint cannot be excluded at this time Admit to telemetry cardiac, inpatient

## 2023-01-20 NOTE — H&P (Addendum)
History and Physical   [name] ZOX:[id number] DOB: Jun 29, 1946 DOA: 01/20/2023  PCP: James Nam, MD  Patient coming from: Home via EMS  I have personally briefly reviewed patient's old medical records in [facility] EMR.  Chief Concern: Near syncope, shortness of breath  HPI: [name] is a 77 year old male with history of hyperlipidemia, persistent atrial fibrillation on Eliquis, history of thoracic aortic aneurysm without dissection, heart failure preserved ejection fraction, hypertension, hypothyroid, obesity, CAD status post PCI to RCA in 2019, who presents to the emergency department for chief concerns of lightheadedness, shortness of breath.  Vitals in the ED showed temperature of 98, respiration rate of 16, heart rate 78, blood pressure 112/65, SpO2 100% on room air.  Serum sodium is 137, potassium 3.7, chloride 104, bicarb 22, BUN of 30, serum creatinine of 1.70, EGFR 42, nonfasting blood glucose 139, WBC 11.5, hemoglobin 11.6, platelets of 153. BNP was 55.3.  High sensitive troponin was 10 and on repeat was 11.  COVID/influenza A/influenza B PCR were negative.  CTA of the chest PE: Was negative for no PE.  No acute or focal lesion.  Stable 4.7 cm ascending thoracic aortic aneurysm.  Coronary artery disease.  Small hiatal hernia.  Aortic atherosclerosis.  ED treatment: Sodium chloride 500 mL bolus has been ordered and given and sodium chloride 1 L bolus ordered and pending administration. ----------------------------- At bedside, patient was able to tell me his name, age, location, current calendar year.  He reports that he has left knee pain that started about 2 days ago.  He denies any fever, nausea, vomiting, chills, chest pain, shortness of breath, dysuria, diarrhea.  He endorses hematuria today that appeared to be frank blood.  He resumed his home Eliquis on evening of 01/19/2023.  He denies known trauma to his knee other than the surgery on 6/13/124.  Social  history: He lives with his wife James [name] of over 50 years.  He denies tobacco and recreational drug use.  He has a small glass of wine or half a whiskey every evening.  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  ROS: Constitutional: no weight change, no fever ENT/Mouth: no sore throat, no rhinorrhea Eyes: no eye pain, no vision changes Cardiovascular: no chest pain, no dyspnea,  + edema, no palpitations Respiratory: no cough, no sputum, no wheezing Gastrointestinal: no nausea, no vomiting, no diarrhea, no constipation Genitourinary: no urinary incontinence, no dysuria, + hematuria Musculoskeletal: + arthralgias, + myalgias of the left leg and knee Skin: no skin lesions, no pruritus, Neuro: + weakness, no loss of consciousness, no syncope Psych: no anxiety, no depression, + decrease appetite Heme/Lymph: no bruising, no bleeding  ED Course: Discussed with emergency medicine provider, patient requiring hospitalization for chief concerns of near syncope, shortness of breath.  Assessment/Plan  Principal Problem:   Near syncope Active Problems:   Severe sepsis (HCC)   Hypothyroidism   Essential hypertension   Hematuria   Pain and swelling of left knee   Persistent atrial fibrillation (HCC)   Dyspnea on exertion   Benign prostatic hyperplasia with nocturia   CHF (congestive heart failure) (HCC)   Coronary artery disease involving native coronary artery of native heart without angina pectoris   Obstructive sleep apnea   Chronic heart failure with preserved ejection fraction (HFpEF) (HCC)   Hyperlipidemia LDL goal <70   Afib (HCC)   S/P total knee replacement, left   AKI (acute kidney injury) (HCC)   Septic joint of left  knee joint (HCC)   Elevated lactic acid level   Assessment and Plan:  * Near syncope Etiology workup in progress Given recent left knee replacement on 01/17/2023, with mild leukocytosis, infectious etiology cannot be excluded at this time Check lactic acid  on admission, procalcitonin If lactic acid is elevated, we will proceed with blood cultures x 2 Septic left knee joint cannot be excluded at this time Admit to telemetry cardiac, inpatient  Severe sepsis [facility]) Patient has elevated respiration rate, lactic acid of 4.3, mild leukocytosis 11.5, possible source of infection is cellulitis of the left knee, renal involvement Holding home ramipril due to AKI Blood cultures x 2 ordered and pending collection Will follow second lactic acid Ceftriaxone 2 g IV daily Admit to telemetry cardiac, inpatient  Elevated lactic acid level First lactic acid was 4.3 We are holding IV abx per orthopedic provider recommendation until his evaluation We will continue to check second lactic acid  Septic joint of left knee joint [facility])  Orthopedic surgeon has been consulted for I&D, joint washout Lactic acid ordered Body culture with Gram stain has been ordered pending collection Ceftriaxone 2 g IV daily, 7 days ordered Symptomatic support: Oxycodone 5 mg p.o. every 6 hours as needed for moderate pain, 3 days ordered; morphine 2 mg IV every 4 hours as needed for severe pain, 20 hours of coverage ordered  Addendum: Orthopedic provider evaluated the patient and states the incision appears clean, patient has no pain with small range of motion, orthopedic surgeon has low clinical suspicion for infectious etiology at this time.  They do not recommend an I&D.  Orthopedic service recommends ice, elevation, Ace wrap to reduce swelling.  AKI (acute kidney injury) (HCC) Query prerenal versus intrarenal in setting of possible sepsis versus septic joint Status post sodium chloride 1.5 L bolus ordered by EDP pending complete administration Holding home ramipril 10 mg daily, AM resume when appropriate Repeat BMP in the a.m.  S/P total knee replacement, left Patient is status post left total knee replacement on 01/17/2023.  Per orthopedic service: would not tap at this  point unless continues to worsen and would not tap without permission from primary surgeon Dr August Saucer in [place] given how acutely post op this is.  Afib (HCC) Hold home Eliquis on admission pending orthopedic evaluation  Pain and swelling of left knee Ultrasound of the left lower extremity was negative for DVT Secondary to septic arthritis  Hematuria Hold home Eliquis on admission Patient's hemoglobin is 11.6, no indication for transfusion at this time Check CBC in a.m.  Essential hypertension Hydralazine 5 mg IV every 8 hours as needed for SBP greater than 180, 1 day ordered  Chart reviewed.   DVT prophylaxis: Heparin 5000 units subcutaneous every 8 hours first dose starting at 2200 we will discontinue if patient needs surgical intervention Code Status: Full code Diet: NPO except for sips of meds and ice chips; pending orthopedic evaluation Family Communication: Discussed with spouse at bedside with patient's permission Disposition Plan: Pending clinical course Consults called: Orthopedic service Admission status: Telemetry cardiac, inpatient  Past Medical History:  Diagnosis Date   (HFpEF) heart failure with preserved ejection fraction (HCC)    a. 05/2018 Echo: EF 55-60%, gr2 DD.   Acute lower GI bleeding after colonoscopy and polypectomy, resolved 12/12/2015   Ascending aortic aneurysm (HCC)    a. 02/2018 Echo: mildly dil Ao root/asc ao/arch; b. 05/2018 CTA Chest: 4.7cm fusiform aneurysm of Asc Ao.   CAD (coronary artery disease)  a. 05/2018 Cath/PCI: LM nl,LAD 30p, 90/99d/apical, LCX 90m, OM1/2/3 min irregs, RCA 85p (3.5x18 [place] DES).   Cardiac murmur    a. 02/2018 Echo: EF 60-65%, no rwma, mild AI, mildly dil Ao root/Asc Ao/Arch, Mild MR, mildly dil LA. Nl RV fxn.   Cataract    bilateral surgery to remove   CHF (congestive heart failure) (HCC)    Colon polyp    Constipation    Dysrhythmia    Hx A. Fib   Essential hypertension    Hemorrhoids    Hepatitis    self  resolved, likely food exposure, 1974.    History of cardiovascular stress test    a. 02/2018 Myoview: EF 55-65%, small/mild apical defect w/ nl wall motion ->attenuation artifact. No ischemia. Low risk study.   Hx of adenomatous colonic polyps 12/05/2015   Hyperlipidemia    Hypothyroidism    Mitral regurgitation    a. 110/2019 Echo: EF 55-60%. Gr2 DD. Triv AI. Ao root 38mm. Mild MR. Mod dil LA, mildly dil RA.   Osteoarthritis    Persistent atrial fibrillation (HCC)    a. Dx 02/2018. CHA2DS2VASc = 2-->Eliquis; b. 03/2018 DCCV (200J); c. 03/2018 recurrent Afib-->flecainide started 04/2018;  d. 05/06/2018 s/p successful DCCV - 200J x 2-->on amio.   Skin cancer    basal cell, R ear, MOHS   Past Surgical History:  Procedure Laterality Date   BROW LIFT Bilateral 10/23/2016   Procedure: BLEPHAROPLASTY upper eyelid with excess skin;  Surgeon: Imagene Riches, MD;  Location: [facility];  Service: Ophthalmology;  Laterality: Bilateral;  MAC   CARDIOVERSION N/A 03/18/2018   Procedure: CARDIOVERSION;  Surgeon: Yvonne Kendall, MD;  Location: [facility] ORS;  Service: Cardiovascular;  Laterality: N/A;   CARDIOVERSION N/A 05/06/2018   Procedure: CARDIOVERSION;  Surgeon: Yvonne Kendall, MD;  Location: [facility] OR[facility];  Service: Cardiovascular;  Laterality: N/A;   CATARACT EXTRACTION  1986   OD   CATARACT EXTRACTION Bilateral 1995   x 2 for right and left    COLONOSCOPY     CORONARY STENT INTERVENTION N/A 05/13/2018   Procedure: CORONARY STENT INTERVENTION;  Surgeon: Yvonne Kendall, MD;  Location: [facility] INVASIVE CV LAB;  Service: Cardiovascular;  Laterality: N/A;   ECTROPION REPAIR Bilateral 10/23/2016   Procedure: REPAIR OF ECTROPION sutures, extensive;  Surgeon: Imagene Riches, MD;  Location: [facility];  Service: Ophthalmology;  Laterality: Bilateral;   LIPOMA EXCISION  06/1997   left neck ([place])   MOHS SURGERY Right 2013   behind right ear   RIGHT/LEFT HEART CATH AND CORONARY ANGIOGRAPHY N/A  05/12/2018   Procedure: RIGHT/LEFT HEART CATH AND CORONARY ANGIOGRAPHY;  Surgeon: Iran Ouch, MD;  Location: [facility] INVASIVE CV LAB;  Service: Cardiovascular;  Laterality: N/A;   TONSILLECTOMY     TOTAL KNEE ARTHROPLASTY Left 01/17/2023   Procedure: LEFT TOTAL KNEE ARTHROPLASTY;  Surgeon: Cammy Copa, MD;  Location: [facility] OR;  Service: Orthopedics;  Laterality: Left;   VASECTOMY  1982   Social History:  reports that he quit smoking about 39 years ago. His smoking use included cigarettes. He has a 40.00 pack-year smoking history. He has been exposed to tobacco smoke. He has never used smokeless tobacco. He reports current alcohol use. He reports that he does not use drugs.  Allergies  Allergen Reactions   Sulfonamide Derivatives Rash    childhood   Family History  Problem Relation Age of Onset   Stroke Mother    Lung cancer Maternal Grandfather  smoker   Stroke Maternal Grandfather    Heart disease Paternal Grandfather        MI, old age   Colon cancer Neg Hx    Colon polyps Neg Hx    Esophageal cancer Neg Hx    Rectal cancer Neg Hx    Stomach cancer Neg Hx    Bladder Cancer Neg Hx    Prostate cancer Neg Hx    Family history: Family history reviewed and not pertinent  Prior to Admission medications   Medication Sig Start Date End Date Taking? Authorizing Provider  acidophilus (RISAQUAD) CAPS capsule Take 1 capsule by mouth daily. Patient not taking: Reported on 01/17/2023    [provider]  apixaban (ELIQUIS) 5 MG TABS tablet Take 1 tablet (5 mg total) by mouth 2 (two) times daily. 08/31/22   James Nam, MD  atorvastatin (LIPITOR) 80 MG tablet TAKE 1 TABLET BY MOUTH DAILY AT [facility] 10/02/22   End, Cristal Deer, MD  carboxymethylcellulose (REFRESH PLUS) 0.5 % SOLN Place 1-2 drops into both eyes 3 (three) times daily as needed (dry/irritated eyes.).    [provider]  diclofenac Sodium (VOLTAREN) 1 % GEL Apply 2 g topically 4 (four) times daily as  needed. Patient taking differently: Apply 2 g topically daily. 09/05/20   James Nam, MD  docusate sodium (COLACE) 100 MG capsule Take 1 capsule (100 mg total) by mouth 2 (two) times daily. 01/18/23   Magnant, Charles L, PA-C  dofetilide (TIKOSYN) 500 MCG capsule Take 1 capsule (500 mcg total) by mouth 2 (two) times daily. 08/15/22   Newman Nip, NP  doxazosin (CARDURA) 4 MG tablet TAKE 1 TABLET BY MOUTH DAILY AFTER BREAKFAST 10/02/22   James Nam, MD  ezetimibe (ZETIA) 10 MG tablet TAKE 1 TABLET BY MOUTH DAILY 01/14/23   End, Cristal Deer, MD  finasteride (PROSCAR) 5 MG tablet TAKE 1 TABLET BY MOUTH EVERY DAY 09/26/22   James Nam, MD  furosemide (LASIX) 20 MG tablet TAKE 1 TABLET BY MOUTH EVERY OTHER DAY, GENERIC EQUIVALENT FOR LASIX 12/04/22   James Nam, MD  levothyroxine (SYNTHROID) 125 MCG tablet TAKE 1 TABLET BY MOUTH DAILY BEFORE BREAKFAST. Patient taking differently: Take 125 mcg by mouth See admin instructions. Take 1 tablet (125 mcg) by mouth on Mondays, Tuesdays, Wednesdays, Thursdays, Fridays and Saturdays ONLY. SKIP SUNDAYS. (0700) 09/26/22   James Nam, MD  lidocaine (LIDODERM) 5 % Place 1 patch onto the skin daily. Remove & Discard patch within 12 hours or as directed by MD 01/19/23   Cathren Laine, MD  methocarbamol (ROBAXIN) 500 MG tablet Take 1 tablet (500 mg total) by mouth every 8 (eight) hours as needed for muscle spasms. 01/18/23   Magnant, Charles L, PA-C  metoprolol succinate (TOPROL XL) 25 MG 24 hr tablet Take 1 tablet (25 mg total) by mouth daily. 05/25/22   End, Cristal Deer, MD  Multiple Vitamin (MULTIVITAMIN WITH MINERALS) TABS tablet Take 1 tablet by mouth daily. Senior Multivitamin    [provider]  oxyCODONE (OXY IR/ROXICODONE) 5 MG immediate release tablet Take 1 tablet (5 mg total) by mouth every 4 (four) hours as needed for moderate pain (pain score 4-6). 01/18/23   Magnant, Charles L, PA-C  psyllium (METAMUCIL) 58.6 % packet Take 1  packet by mouth daily as needed (constipation).    [provider]  ramipril (ALTACE) 10 MG capsule TAKE ONE CAPSULE BY MOUTH DAILY. GENERIC EQUIVALENT FOR ALTACE 10/02/22   James Nam,  MD   Physical Exam: Vitals:   01/20/23 1200 01/20/23 1230 01/20/23 1545 01/20/23 1621  BP: 112/65 103/62 (!) 148/74   Pulse: 78 75 77   Resp: 16 10 15    Temp:    98.1 F (36.7 C)  TempSrc:    Oral  SpO2: 100% 100% 100%   Weight:      Height:       Constitutional: appears age-appropriate, NAD, calm Eyes: PERRL, lids and conjunctivae normal ENMT: Mucous membranes are moist. Posterior pharynx clear of any exudate or lesions. Age-appropriate dentition. Hearing appropriate Neck: normal, supple, no masses, no thyromegaly Respiratory: clear to auscultation bilaterally, no wheezing, no crackles. Normal respiratory effort. No accessory muscle use.  Cardiovascular: Regular rate and rhythm, no murmurs / rubs / gallops. No extremity edema. 2+ pedal pulses. No carotid bruits.  Abdomen: no tenderness, no masses palpated, no hepatosplenomegaly. Bowel sounds positive.  Musculoskeletal: no clubbing / cyanosis. No joint deformity upper and lower extremities. Normal muscle tone.  Decreased range of motion of the left knee.  Left knee is warm, red, swollen, consistent with septic arthritis  Skin: no rashes, lesions, ulcers. No induration Neurologic: Sensation intact. Strength 5/5 in all 4.  Psychiatric: Normal judgment and insight. Alert and oriented x 3. Normal mood.   EKG: independently reviewed, showing sinus rhythm with rate of 82, QTc 478  Chest x-ray on Admission: I personally reviewed and I agree with radiologist reading as below.  US Venous Img Lower Unilateral Left  Result Date: 01/20/2023 CLINICAL DATA:  Left lower extremity pain and edema. Evaluate for DVT. EXAM: LEFT LOWER EXTREMITY VENOUS DOPPLER ULTRASOUND TECHNIQUE: Gray-scale sonography with graded compression, as well as color Doppler and  duplex ultrasound were performed to evaluate the lower extremity deep venous systems from the level of the common femoral vein and including the common femoral, femoral, profunda femoral, popliteal and calf veins including the posterior tibial, peroneal and gastrocnemius veins when visible. The superficial great saphenous vein was also interrogated. Spectral Doppler was utilized to evaluate flow at rest and with distal augmentation maneuvers in the common femoral, femoral and popliteal veins. COMPARISON:  None Available. FINDINGS: Contralateral Common Femoral Vein: Respiratory phasicity is normal and symmetric with the symptomatic side. No evidence of thrombus. Normal compressibility. Common Femoral Vein: No evidence of thrombus. Normal compressibility, respiratory phasicity and response to augmentation. Saphenofemoral Junction: No evidence of thrombus. Normal compressibility and flow on color Doppler imaging. Profunda Femoral Vein: No evidence of thrombus. Normal compressibility and flow on color Doppler imaging. Femoral Vein: No evidence of thrombus. Normal compressibility, respiratory phasicity and response to augmentation. Popliteal Vein: No evidence of thrombus. Normal compressibility, respiratory phasicity and response to augmentation. Calf Veins: No evidence of thrombus. Normal compressibility and flow on color Doppler imaging. Superficial Great Saphenous Vein: No evidence of thrombus. Normal compressibility. Other Findings:  None. IMPRESSION: No evidence of DVT within the left lower extremity. Electronically Signed   By: Simonne Come M.D.   On: 01/20/2023 15:40   CT Angio Chest PE W and/or Wo Contrast  Result Date: 01/20/2023 CLINICAL DATA:  Pulmonary embolism suspected, high probability. Lightheadedness and shortness of breath. EXAM: CT ANGIOGRAPHY CHEST WITH CONTRAST TECHNIQUE: Multidetector CT imaging of the chest was performed using the standard protocol during bolus administration of intravenous  contrast. Multiplanar CT image reconstructions and MIPs were obtained to evaluate the vascular anatomy. RADIATION DOSE REDUCTION: This exam was performed according to the departmental dose-optimization program which includes automated exposure control, adjustment of the  mA and/or kV according to patient size and/or use of iterative reconstruction technique. CONTRAST:  60mL OMNIPAQUE IOHEXOL 350 MG/ML SOLN COMPARISON:  CT angio chest 12/06/2022 FINDINGS: Cardiovascular: The heart size is normal. Coronary artery calcifications are present. The ascending thoracic aorta is stable measuring 4.7 cm. Atherosclerotic calcifications are present at the aortic arch and great vessel origins without significant focal stenosis. Mediastinum/Nodes: Pulmonary artery opacification is excellent. No focal filling defects are present to suggest pulmonary embolus. Pulmonary artery size is normal. Lungs/Pleura: Lungs are clear. No pleural effusion or pneumothorax. Upper Abdomen: Limited imaging the abdomen is unremarkable. There is no significant adenopathy. No solid organ lesions are present. A small hiatal hernia is present. Musculoskeletal: No chest wall abnormality. No acute or significant osseous findings. Review of the MIP images confirms the above findings. IMPRESSION: 1. No pulmonary embolus. 2. No acute or focal lesion to explain the patient's symptoms. 3. Stable 4.7 cm ascending thoracic aortic aneurysm. Ascending thoracic aortic aneurysm. Recommend semi-annual imaging followup by CTA or MRA and referral to cardiothoracic surgery if not already obtained. This recommendation follows 2010 ACCF/AHA/AATS/ACR/ASA/SCA/SCAI/SIR/STS/SVM Guidelines for the Diagnosis and Management of Patients With Thoracic Aortic Disease. Circulation. 2010; 121: W098-J191. Aortic aneurysm NOS (ICD10-I71.9) Coronary artery disease. 4. Small hiatal hernia. 5.  Aortic Atherosclerosis (ICD10-I70.0). Electronically Signed   By: Marin Roberts M.D.   On:  01/20/2023 13:22   DG Chest Portable 1 View  Result Date: 01/20/2023 CLINICAL DATA:  dyspnea EXAM: PORTABLE CHEST 1 VIEW COMPARISON:  Dec 06, 2022, July 08, 2021 FINDINGS: The cardiomediastinal silhouette is unchanged in contour. No pleural effusion. No pneumothorax. No acute pleuroparenchymal abnormality. IMPRESSION: No acute cardiopulmonary abnormality. Electronically Signed   By: Meda Klinefelter M.D.   On: 01/20/2023 12:11   DG Knee Complete 4 Views Left  Result Date: 01/19/2023 CLINICAL DATA:  Postoperative day 2 following total left knee joint replacement with pain unresponsive to oral medications. EXAM: LEFT KNEE - COMPLETE 4+ VIEW COMPARISON:  Preoperative AP Lat 10/25/2022. There is no postoperative study other than today's. FINDINGS: There is a noncemented total joint arthroplasty. No fracture or acute perihardware complications suspected. There is osteopenia. No destructive bone lesion is seen. Superficial soft tissues show mild edema greater laterally. There is moderate fluid distending the suprapatellar bursa. The fluid contains multiple small air locules. There is additional scattered intra-articular air in the infrapatellar space. This soon after surgery, the air containing fluid could be a benign postoperative effusion with air left over from the surgical procedure, or could be due to an infectious complication. Clinical correlation and follow-up advised. There are calcifications of the superficial femoral and posterior tibial arteries. IMPRESSION: 1. No fracture or acute perihardware complications of total joint arthroplasty. 2. Moderate fluid distending the suprapatellar bursa with multiple small air locules. This soon after surgery, the air containing fluid could be a benign postoperative effusion with air left over from the surgical procedure, or could be due to an infectious complication. Electronically Signed   By: Almira Bar M.D.   On: 01/19/2023 06:15    Labs on Admission: I  have personally reviewed following labs  CBC: Recent Labs  Lab 01/20/23 1155  WBC 11.5*  NEUTROABS 9.6*  HGB 11.6*  HCT 35.6*  MCV 94.4  PLT 153   Basic Metabolic Panel: Recent Labs  Lab 01/18/23 0211 01/20/23 1155  NA 139 137  K 4.6 3.7  CL 107 104  CO2 24 22  GLUCOSE 115* 139*  BUN 20 30*  CREATININE  1.39* 1.70*  CALCIUM 8.8* 9.1  MG 2.2  --    GFR: Estimated Creatinine Clearance: 49.5 mL/min (A) (by C-G formula based on SCr of 1.7 mg/dL (H)).  Urine analysis:    Component Value Date/Time   COLORURINE YELLOW (A) 01/20/2023 1155   APPEARANCEUR CLEAR (A) 01/20/2023 1155   APPEARANCEUR Clear 10/05/2021 1144   LABSPEC 1.021 01/20/2023 1155   PHURINE 6.0 01/20/2023 1155   GLUCOSEU NEGATIVE 01/20/2023 1155   HGBUR LARGE (A) 01/20/2023 1155   BILIRUBINUR NEGATIVE 01/20/2023 1155   BILIRUBINUR Negative 10/05/2021 1144   KETONESUR NEGATIVE 01/20/2023 1155   PROTEINUR NEGATIVE 01/20/2023 1155   UROBILINOGEN 0.2 03/06/2016 1212   NITRITE NEGATIVE 01/20/2023 1155   LEUKOCYTESUR NEGATIVE 01/20/2023 1155   This document was prepared using Dragon Voice Recognition software and may include unintentional dictation errors.  Dr. Sedalia Muta [facility]  If 7PM-7AM, please contact overnight-coverage provider If 7AM-7PM, please contact day attending provider [email or web address]  01/20/2023, 5:24 PM

## 2023-01-20 NOTE — Assessment & Plan Note (Signed)
Ultrasound of the left lower extremity was negative for DVT Secondary to septic arthritis

## 2023-01-20 NOTE — Assessment & Plan Note (Signed)
Hold home Eliquis on admission Patient's hemoglobin is 11.6, no indication for transfusion at this time Check CBC in a.m.

## 2023-01-20 NOTE — ED Notes (Signed)
Wife brought in ice box machine for patients knee per request of the doctor.

## 2023-01-20 NOTE — Assessment & Plan Note (Signed)
Hold home Eliquis on admission pending orthopedic evaluation

## 2023-01-20 NOTE — ED Notes (Signed)
Pillow placed under patient's left leg, left heel floated off bed for comfort.

## 2023-01-20 NOTE — Hospital Course (Addendum)
[name] is a 77 year old male with history of hyperlipidemia, persistent atrial fibrillation on Eliquis, history of thoracic aortic aneurysm without dissection, heart failure preserved ejection fraction, hypertension, hypothyroid, obesity, CAD status post PCI to RCA in 2019, who presents to the emergency department for chief concerns of lightheadedness, shortness of breath.  Vitals in the ED showed temperature of 98, respiration rate of 16, heart rate 78, blood pressure 112/65, SpO2 100% on room air.  Serum sodium is 137, potassium 3.7, chloride 104, bicarb 22, BUN of 30, serum creatinine of 1.70, EGFR 42, nonfasting blood glucose 139, WBC 11.5, hemoglobin 11.6, platelets of 153. BNP was 55.3.  High sensitive troponin was 10 and on repeat was 11.  COVID/influenza A/influenza B PCR were negative.  CTA of the chest PE: Was negative for no PE.  No acute or focal lesion.  Stable 4.7 cm ascending thoracic aortic aneurysm.  Coronary artery disease.  Small hiatal hernia.  Aortic atherosclerosis.  ED treatment: Sodium chloride 500 mL bolus has been ordered and given and sodium chloride 1 L bolus ordered and pending administration.

## 2023-01-20 NOTE — Assessment & Plan Note (Signed)
Patient has elevated respiration rate, lactic acid of 4.3, mild leukocytosis 11.5, possible source of infection is cellulitis of the left knee, renal involvement Holding home ramipril due to AKI Blood cultures x 2 ordered and pending collection Will follow second lactic acid Ceftriaxone 2 g IV daily Admit to telemetry cardiac, inpatient

## 2023-01-20 NOTE — Assessment & Plan Note (Addendum)
  Orthopedic surgeon has been consulted for I&D, joint washout Lactic acid ordered Body culture with Gram stain has been ordered pending collection Ceftriaxone 2 g IV daily, 7 days ordered Symptomatic support: Oxycodone 5 mg p.o. every 6 hours as needed for moderate pain, 3 days ordered; morphine 2 mg IV every 4 hours as needed for severe pain, 20 hours of coverage ordered  Addendum: Orthopedic provider evaluated the patient and states the incision appears clean, patient has no pain with small range of motion, orthopedic surgeon has low clinical suspicion for infectious etiology at this time.  They do not recommend an I&D.  Orthopedic service recommends ice, elevation, Ace wrap to reduce swelling.

## 2023-01-20 NOTE — ED Notes (Signed)
This RN notified Dr Londell Moh of critical lactic acid - 4.3 - via secure chat

## 2023-01-20 NOTE — Assessment & Plan Note (Signed)
First lactic acid was 4.3 We are holding IV abx per orthopedic provider recommendation until his evaluation We will continue to check second lactic acid

## 2023-01-20 NOTE — Consult Note (Signed)
ORTHOPAEDIC CONSULTATION  REQUESTING PHYSICIAN: Cox, Amy N, DO  Chief Complaint:   Near syncope with elevated lactate status post total knee replacement  History of Present Illness: James [name] is a 77 y.o. male with history of hyperlipidemia, persistent atrial fibrillation on Eliquis, history of thoracic aortic aneurysm without dissection, heart failure preserved ejection fraction, hypertension, hypothyroid, obesity, CAD status post PCI to RCA in 2019, and left total knee replacement 3 days ago.  Patient presented to the emergency department with lightheadedness and shortness of breath with normal vitals in the emergency room on admission.  Patient did have an elevated white count at 11.5 and was negative for upper respiratory infection, negative for lower extremity DVT on ultrasound, and negative for PE on CTA.  Patient denies any fevers to me.  He reports he had some fairly severe knee pain on postop day 2 and was evaluated and reports his knee pain is actually improved at this time.  He is able to move the knee with less pain.  Denies any chest pain or shortness of breath at this time.  He has had no falls or other injuries since the surgery.  Of note surgery was done by Dr. August Saucer in [place] at [facility] on 01/17/2023.   Past Medical History:  Diagnosis Date   (HFpEF) heart failure with preserved ejection fraction (HCC)    a. 05/2018 Echo: EF 55-60%, gr2 DD.   Acute lower GI bleeding after colonoscopy and polypectomy, resolved 12/12/2015   Ascending aortic aneurysm (HCC)    a. 02/2018 Echo: mildly dil Ao root/asc ao/arch; b. 05/2018 CTA Chest: 4.7cm fusiform aneurysm of Asc Ao.   CAD (coronary artery disease)    a. 05/2018 Cath/PCI: LM nl,LAD 30p, 90/99d/apical, LCX 84m, OM1/2/3 min irregs, RCA 85p (3.5x18 [place] DES).   Cardiac murmur    a. 02/2018 Echo: EF 60-65%, no rwma, mild AI, mildly dil Ao root/Asc Ao/Arch, Mild MR,  mildly dil LA. Nl RV fxn.   Cataract    bilateral surgery to remove   CHF (congestive heart failure) (HCC)    Colon polyp    Constipation    Dysrhythmia    Hx A. Fib   Essential hypertension    Hemorrhoids    Hepatitis    self resolved, likely food exposure, 1974.    History of cardiovascular stress test    a. 02/2018 Myoview: EF 55-65%, small/mild apical defect w/ nl wall motion ->attenuation artifact. No ischemia. Low risk study.   Hx of adenomatous colonic polyps 12/05/2015   Hyperlipidemia    Hypothyroidism    Mitral regurgitation    a. 110/2019 Echo: EF 55-60%. Gr2 DD. Triv AI. Ao root 38mm. Mild MR. Mod dil LA, mildly dil RA.   Osteoarthritis    Persistent atrial fibrillation (HCC)    a. Dx 02/2018. CHA2DS2VASc = 2-->Eliquis; b. 03/2018 DCCV (200J); c. 03/2018 recurrent Afib-->flecainide started 04/2018;  d. 05/06/2018 s/p successful DCCV - 200J x 2-->on amio.   Skin cancer    basal cell, R ear, MOHS   Past Surgical History:  Procedure Laterality Date   BROW LIFT Bilateral 10/23/2016   Procedure: BLEPHAROPLASTY upper eyelid with excess skin;  Surgeon: Imagene Riches, MD;  Location: [facility];  Service: Ophthalmology;  Laterality: Bilateral;  MAC   CARDIOVERSION N/A 03/18/2018   Procedure: CARDIOVERSION;  Surgeon: Yvonne Kendall, MD;  Location: [facility] ORS;  Service: Cardiovascular;  Laterality: N/A;   CARDIOVERSION N/A 05/06/2018   Procedure: CARDIOVERSION;  Surgeon: Yvonne Kendall,  MD;  Location: [facility] ORS;  Service: Cardiovascular;  Laterality: N/A;   CATARACT EXTRACTION  1986   OD   CATARACT EXTRACTION Bilateral 1995   x 2 for right and left    COLONOSCOPY     CORONARY STENT INTERVENTION N/A 05/13/2018   Procedure: CORONARY STENT INTERVENTION;  Surgeon: Yvonne Kendall, MD;  Location: [facility] INVASIVE [facility] LAB;  Service: Cardiovascular;  Laterality: N/A;   ECTROPION REPAIR Bilateral 10/23/2016   Procedure: REPAIR OF ECTROPION sutures, extensive;  Surgeon: Imagene Riches, MD;   Location: [facility];  Service: Ophthalmology;  Laterality: Bilateral;   LIPOMA EXCISION  06/1997   left neck (Juengel)   MOHS SURGERY Right 2013   behind right ear   RIGHT/LEFT HEART CATH AND CORONARY ANGIOGRAPHY N/A 05/12/2018   Procedure: RIGHT/LEFT HEART CATH AND CORONARY ANGIOGRAPHY;  Surgeon: Iran Ouch, MD;  Location: [facility] INVASIVE CV LAB;  Service: Cardiovascular;  Laterality: N/A;   TONSILLECTOMY     TOTAL KNEE ARTHROPLASTY Left 01/17/2023   Procedure: LEFT TOTAL KNEE ARTHROPLASTY;  Surgeon: Cammy Copa, MD;  Location: [facility] OR;  Service: Orthopedics;  Laterality: Left;   VASECTOMY  1982   Social History   Socioeconomic History   Marital status: Married    Spouse name: [name]   Number of children: 1   Years of education: 14   Highest education level: Associate degree: occupational, Scientist, product/process development, or vocational program  Occupational History   Occupation: Retired    Associate Professor: RETIRED    Comment: 1  Tobacco Use   Smoking status: Former    Packs/day: 2.00    Years: 20.00    Additional pack years: 0.00    Total pack years: 40.00    Types: Cigarettes    Quit date: 08/07/1983    Years since quitting: 39.4    Passive exposure: Past   Smokeless tobacco: Never   Tobacco comments:    Former smoker 11/17/21 quit over 40 years ago  Vaping Use   Vaping Use: Never used  Substance and Sexual Activity   Alcohol use: Yes    Comment: 1-2 per day   Drug use: No   Sexual activity: Yes    Partners: Female  Other Topics Concern   Not on file  Social History Narrative   Married and lives with wife 1974   One daughter, not local   Retired from [employer], sea based Administrator, sports   Social Determinants of Health   Financial Resource Strain: Low Risk  (08/20/2022)   Overall Financial Resource Strain (CARDIA)    Difficulty of Paying Living Expenses: Not hard at all  Food Insecurity: No Food Insecurity (01/17/2023)   Hunger Vital Sign    Worried About Running  Out of Food in the Last Year: Never true    Ran Out of Food in the Last Year: Never true  Transportation Needs: No Transportation Needs (01/17/2023)   PRAPARE - Administrator, Civil Service (Medical): No    Lack of Transportation (Non-Medical): No  Physical Activity: Insufficiently Active (08/20/2022)   Exercise Vital Sign    Days of Exercise per Week: 5 days    Minutes of Exercise per Session: 20 min  Stress: No Stress Concern Present (08/20/2022)   [employer] of Occupational Health - Occupational Stress Questionnaire    Feeling of Stress : Not at all  Social Connections: Unknown (08/20/2022)   Social Connection and Isolation Panel [NHANES]    Frequency of Communication with Friends and Family: Once a  week    Frequency of Social Gatherings with Friends and Family: Once a week    Attends Religious Services: Not on file    Active Member of Clubs or Organizations: Yes    Attends Banker Meetings: 1 to 4 times per year    Marital Status: Widowed   Family History  Problem Relation Age of Onset   Stroke Mother    Lung cancer Maternal Grandfather        smoker   Stroke Maternal Grandfather    Heart disease Paternal Grandfather        MI, old age   Colon cancer Neg Hx    Colon polyps Neg Hx    Esophageal cancer Neg Hx    Rectal cancer Neg Hx    Stomach cancer Neg Hx    Bladder Cancer Neg Hx    Prostate cancer Neg Hx    Allergies  Allergen Reactions   Sulfonamide Derivatives Rash    childhood   Prior to Admission medications   Medication Sig Start Date End Date Taking? Authorizing Provider  acetaminophen (TYLENOL) 500 MG tablet Take 1,000 mg by mouth every 6 (six) hours as needed for mild pain or moderate pain.   Yes [provider]  apixaban (ELIQUIS) 5 MG TABS tablet Take 1 tablet (5 mg total) by mouth 2 (two) times daily. 08/31/22  Yes Joaquim Nam, MD  atorvastatin (LIPITOR) 80 MG tablet TAKE 1 TABLET BY MOUTH DAILY AT [facility] 10/02/22   Yes End, Cristal Deer, MD  carboxymethylcellulose (REFRESH PLUS) 0.5 % SOLN Place 1-2 drops into both eyes 3 (three) times daily as needed (dry/irritated eyes.).   Yes [provider]  diclofenac Sodium (VOLTAREN) 1 % GEL Apply 2 g topically 4 (four) times daily as needed. Patient taking differently: Apply 2 g topically daily. 09/05/20  Yes Joaquim Nam, MD  docusate sodium (COLACE) 100 MG capsule Take 1 capsule (100 mg total) by mouth 2 (two) times daily. 01/18/23  Yes Magnant, Joycie Peek, PA-C  dofetilide (TIKOSYN) 500 MCG capsule Take 1 capsule (500 mcg total) by mouth 2 (two) times daily. 08/15/22  Yes Newman Nip, NP  doxazosin (CARDURA) 4 MG tablet TAKE 1 TABLET BY MOUTH DAILY AFTER BREAKFAST 10/02/22  Yes Joaquim Nam, MD  ezetimibe (ZETIA) 10 MG tablet TAKE 1 TABLET BY MOUTH DAILY 01/14/23  Yes End, Cristal Deer, MD  finasteride (PROSCAR) 5 MG tablet TAKE 1 TABLET BY MOUTH EVERY DAY 09/26/22  Yes Joaquim Nam, MD  furosemide (LASIX) 20 MG tablet TAKE 1 TABLET BY MOUTH EVERY OTHER DAY, GENERIC EQUIVALENT FOR LASIX 12/04/22  Yes Joaquim Nam, MD  levothyroxine (SYNTHROID) 125 MCG tablet TAKE 1 TABLET BY MOUTH DAILY BEFORE BREAKFAST. Patient taking differently: Take 125 mcg by mouth See admin instructions. Take 1 tablet (125 mcg) by mouth on Mondays, Tuesdays, Wednesdays, Thursdays, Fridays and Saturdays ONLY. SKIP SUNDAYS. (0700) 09/26/22  Yes Joaquim Nam, MD  methocarbamol (ROBAXIN) 500 MG tablet Take 1 tablet (500 mg total) by mouth every 8 (eight) hours as needed for muscle spasms. 01/18/23  Yes Magnant, Charles L, PA-C  metoprolol succinate (TOPROL XL) 25 MG 24 hr tablet Take 1 tablet (25 mg total) by mouth daily. 05/25/22  Yes End, Cristal Deer, MD  Multiple Vitamin (MULTIVITAMIN WITH MINERALS) TABS tablet Take 1 tablet by mouth daily. Senior Multivitamin   Yes [provider]  oxyCODONE (OXY IR/ROXICODONE) 5 MG immediate release tablet Take 1 tablet (5 mg  total)  by mouth every 4 (four) hours as needed for moderate pain (pain score 4-6). 01/18/23  Yes Magnant, Charles L, PA-C  psyllium (METAMUCIL) 58.6 % packet Take 1 packet by mouth daily as needed (constipation).   Yes [provider]  ramipril (ALTACE) 10 MG capsule TAKE ONE CAPSULE BY MOUTH DAILY. GENERIC EQUIVALENT FOR ALTACE 10/02/22  Yes Joaquim Nam, MD  lidocaine (LIDODERM) 5 % Place 1 patch onto the skin daily. Remove & Discard patch within 12 hours or as directed by MD Patient not taking: Reported on 01/20/2023 01/19/23   Cathren Laine, MD   US Venous Img Lower Unilateral Left  Result Date: 01/20/2023 CLINICAL DATA:  Left lower extremity pain and edema. Evaluate for DVT. EXAM: LEFT LOWER EXTREMITY VENOUS DOPPLER ULTRASOUND TECHNIQUE: Gray-scale sonography with graded compression, as well as color Doppler and duplex ultrasound were performed to evaluate the lower extremity deep venous systems from the level of the common femoral vein and including the common femoral, femoral, profunda femoral, popliteal and calf veins including the posterior tibial, peroneal and gastrocnemius veins when visible. The superficial great saphenous vein was also interrogated. Spectral Doppler was utilized to evaluate flow at rest and with distal augmentation maneuvers in the common femoral, femoral and popliteal veins. COMPARISON:  None Available. FINDINGS: Contralateral Common Femoral Vein: Respiratory phasicity is normal and symmetric with the symptomatic side. No evidence of thrombus. Normal compressibility. Common Femoral Vein: No evidence of thrombus. Normal compressibility, respiratory phasicity and response to augmentation. Saphenofemoral Junction: No evidence of thrombus. Normal compressibility and flow on color Doppler imaging. Profunda Femoral Vein: No evidence of thrombus. Normal compressibility and flow on color Doppler imaging. Femoral Vein: No evidence of thrombus. Normal compressibility,  respiratory phasicity and response to augmentation. Popliteal Vein: No evidence of thrombus. Normal compressibility, respiratory phasicity and response to augmentation. Calf Veins: No evidence of thrombus. Normal compressibility and flow on color Doppler imaging. Superficial Great Saphenous Vein: No evidence of thrombus. Normal compressibility. Other Findings:  None. IMPRESSION: No evidence of DVT within the left lower extremity. Electronically Signed   By: Simonne Come M.D.   On: 01/20/2023 15:40   CT Angio Chest PE W and/or Wo Contrast  Result Date: 01/20/2023 CLINICAL DATA:  Pulmonary embolism suspected, high probability. Lightheadedness and shortness of breath. EXAM: CT ANGIOGRAPHY CHEST WITH CONTRAST TECHNIQUE: Multidetector CT imaging of the chest was performed using the standard protocol during bolus administration of intravenous contrast. Multiplanar CT image reconstructions and MIPs were obtained to evaluate the vascular anatomy. RADIATION DOSE REDUCTION: This exam was performed according to the departmental dose-optimization program which includes automated exposure control, adjustment of the mA and/or kV according to patient size and/or use of iterative reconstruction technique. CONTRAST:  60mL OMNIPAQUE IOHEXOL 350 MG/ML SOLN COMPARISON:  CT angio chest 12/06/2022 FINDINGS: Cardiovascular: The heart size is normal. Coronary artery calcifications are present. The ascending thoracic aorta is stable measuring 4.7 cm. Atherosclerotic calcifications are present at the aortic arch and great vessel origins without significant focal stenosis. Mediastinum/Nodes: Pulmonary artery opacification is excellent. No focal filling defects are present to suggest pulmonary embolus. Pulmonary artery size is normal. Lungs/Pleura: Lungs are clear. No pleural effusion or pneumothorax. Upper Abdomen: Limited imaging the abdomen is unremarkable. There is no significant adenopathy. No solid organ lesions are present. A small  hiatal hernia is present. Musculoskeletal: No chest wall abnormality. No acute or significant osseous findings. Review of the MIP images confirms the above findings. IMPRESSION: 1. No pulmonary embolus. 2. No acute  or focal lesion to explain the patient's symptoms. 3. Stable 4.7 cm ascending thoracic aortic aneurysm. Ascending thoracic aortic aneurysm. Recommend semi-annual imaging followup by CTA or MRA and referral to cardiothoracic surgery if not already obtained. This recommendation follows 2010 ACCF/AHA/AATS/ACR/ASA/SCA/SCAI/SIR/STS/SVM Guidelines for the Diagnosis and Management of Patients With Thoracic Aortic Disease. Circulation. 2010; 121: W413-K440. Aortic aneurysm NOS (ICD10-I71.9) Coronary artery disease. 4. Small hiatal hernia. 5.  Aortic Atherosclerosis (ICD10-I70.0). Electronically Signed   By: Marin Roberts M.D.   On: 01/20/2023 13:22   DG Chest Portable 1 View  Result Date: 01/20/2023 CLINICAL DATA:  dyspnea EXAM: PORTABLE CHEST 1 VIEW COMPARISON:  Dec 06, 2022, July 08, 2021 FINDINGS: The cardiomediastinal silhouette is unchanged in contour. No pleural effusion. No pneumothorax. No acute pleuroparenchymal abnormality. IMPRESSION: No acute cardiopulmonary abnormality. Electronically Signed   By: Meda Klinefelter M.D.   On: 01/20/2023 12:11   DG Knee Complete 4 Views Left  Result Date: 01/19/2023 CLINICAL DATA:  Postoperative day 2 following total left knee joint replacement with pain unresponsive to oral medications. EXAM: LEFT KNEE - COMPLETE 4+ VIEW COMPARISON:  Preoperative AP Lat 10/25/2022. There is no postoperative study other than today's. FINDINGS: There is a noncemented total joint arthroplasty. No fracture or acute perihardware complications suspected. There is osteopenia. No destructive bone lesion is seen. Superficial soft tissues show mild edema greater laterally. There is moderate fluid distending the suprapatellar bursa. The fluid contains multiple small air  locules. There is additional scattered intra-articular air in the infrapatellar space. This soon after surgery, the air containing fluid could be a benign postoperative effusion with air left over from the surgical procedure, or could be due to an infectious complication. Clinical correlation and follow-up advised. There are calcifications of the superficial femoral and posterior tibial arteries. IMPRESSION: 1. No fracture or acute perihardware complications of total joint arthroplasty. 2. Moderate fluid distending the suprapatellar bursa with multiple small air locules. This soon after surgery, the air containing fluid could be a benign postoperative effusion with air left over from the surgical procedure, or could be due to an infectious complication. Electronically Signed   By: Almira Bar M.D.   On: 01/19/2023 06:15    Positive ROS: All other systems have been reviewed and were otherwise negative with the exception of those mentioned in the HPI and as above.  Physical Exam: General:  Alert, no acute distress Psychiatric:  Patient is competent for consent with normal mood and affect   Cardiovascular:  No pedal edema Respiratory:  No wheezing, non-labored breathing GI:  Abdomen is soft and non-tender Skin:  No lesions in the area of chief complaint Neurologic:  Sensation intact distally Lymphatic:  No axillary or cervical lymphadenopathy  Orthopedic Exam:  Left lower extremity Midline incision over the left knee with dressing in place, no drainage or open wounds.  There is a small amount of erythema which is not streaking and blanches with compression around the anterior knee nonspecifically.  Aquacel dressing carefully elevated to inspect the incision which appears pristine with Steri-Strips in place and no peri-incisional erythema or drainage.  There is swelling to the knee but appears normal given his size and the fact that he is postop day 3 from a total knee and has not had any compressive  wrap on the knee for the last 24 hours. Able to range the knee from 10-75 degrees with minimal pain. Neurovascular intact distally with intact dorsalis pedis pulse Able to dorsiflex toes and foot Compartments all soft  X-rays:  4 view x-rays of the left knee, AP, lateral, and 2 oblique views taken this morning images and report reviewed by myself show the patient to be status post press-fit style total knee replacement with components in appropriate appearing position without any evidence of periprosthetic loosening or fracture.  Moderate effusion on lateral normal for acute postoperative knee.  Patella is resurfaced.  Assessment: Status post left total knee replacement postop day 3 presenting with elevated lactate and near syncopal episode  Plan: I reviewed the physical exam and radiological findings with the patient and his wife.  The knee appears as a normal postop day 3 need to myself with a small amount of erythema in the superficial skin diffusely around the knee.  This could be normal postoperative erythema or could be a potential early cellulitis.  Given the very short period of time after surgery and his physical exam I have low clinical suspicion that this knee is infected at this time.  Patient reports he was not drinking much water at home and has near syncopal episode does seem like it may be related to some dehydration.  Given the elevated lactate I do think it is reasonable to continue medical management and if the medical team would like to do antibiotics I do not see a contraindication to doing so.  I do not feel that the knee needs to be aspirated at this time as it does appear to be a normal postoperative day 3 total knee and I would reach out to the patient's primary surgeon Dr. August Saucer at [facility] before doing any such procedure.  Should erythema around the knee worsen or any drainage or other symptoms develop please reconsult but at this time I do not feel strongly that the knee is  a source of infection.  Recommend around-the-clock icing, elevation, compressive wrap and TED hose, and continued physical therapy to work on range of motion and normal postoperative total knee protocols.  All questions answered with the patient and wife and they agree with the above plan.   Reinaldo Berber MD  Beeper #:  [phone]  01/20/2023 5:32 PM

## 2023-01-20 NOTE — ED Triage Notes (Signed)
Pt brought in from home by EMS for lightheadedness and shortness of breath. Pt states he got out of bed to go to the bathroom and get breakfast. By the the time he made it to the kitchen, he was out of breath and a little dizzy. Pt states he felt weak, and was afraid of falling. Initial BP taken by EMS was 89/56. Upon arrival to ED, pt has no complaints of dizziness or SOB. BP upon arrival to ED is 99/60.

## 2023-01-20 NOTE — Assessment & Plan Note (Signed)
Hydralazine 5 mg IV every 8 hours as needed for SBP greater than 180, 1 day ordered

## 2023-01-20 NOTE — Progress Notes (Signed)
CPAP therapy: pt refused this admission. Pt found alert on RA w/o distress and good sp02. Pt denies OSA diagnosis and denies use at home. Pt states "prob wouldnt be able to tol it or sleep with it on anyway" . Device not in room at this time. Order left standing with documentation.

## 2023-01-20 NOTE — Assessment & Plan Note (Addendum)
Patient is status post left total knee replacement on 01/17/2023.  Per orthopedic service: would not tap at this point unless continues to worsen and would not tap without permission from primary surgeon Dr August Saucer in [place] given how acutely post op this is.

## 2023-01-21 ENCOUNTER — Telehealth: Payer: Self-pay | Admitting: Orthopedic Surgery

## 2023-01-21 DIAGNOSIS — M25462 Effusion, left knee: Secondary | ICD-10-CM

## 2023-01-21 DIAGNOSIS — Z96652 Presence of left artificial knee joint: Secondary | ICD-10-CM

## 2023-01-21 DIAGNOSIS — R351 Nocturia: Secondary | ICD-10-CM

## 2023-01-21 DIAGNOSIS — N179 Acute kidney failure, unspecified: Secondary | ICD-10-CM

## 2023-01-21 DIAGNOSIS — E860 Dehydration: Principal | ICD-10-CM

## 2023-01-21 DIAGNOSIS — I5032 Chronic diastolic (congestive) heart failure: Secondary | ICD-10-CM

## 2023-01-21 DIAGNOSIS — M25562 Pain in left knee: Secondary | ICD-10-CM

## 2023-01-21 DIAGNOSIS — G4733 Obstructive sleep apnea (adult) (pediatric): Secondary | ICD-10-CM

## 2023-01-21 DIAGNOSIS — A419 Sepsis, unspecified organism: Secondary | ICD-10-CM

## 2023-01-21 DIAGNOSIS — R7989 Other specified abnormal findings of blood chemistry: Secondary | ICD-10-CM

## 2023-01-21 DIAGNOSIS — R55 Syncope and collapse: Secondary | ICD-10-CM | POA: Diagnosis not present

## 2023-01-21 DIAGNOSIS — N401 Enlarged prostate with lower urinary tract symptoms: Secondary | ICD-10-CM

## 2023-01-21 DIAGNOSIS — R6520 Severe sepsis without septic shock: Secondary | ICD-10-CM

## 2023-01-21 LAB — PROTIME-INR
INR: 1.7 — ABNORMAL HIGH (ref 0.8–1.2)
Prothrombin Time: 20 seconds — ABNORMAL HIGH (ref 11.4–15.2)

## 2023-01-21 LAB — CBC
HCT: 28 % — ABNORMAL LOW (ref 39.0–52.0)
Hemoglobin: 9.2 g/dL — ABNORMAL LOW (ref 13.0–17.0)
MCH: 31.1 pg (ref 26.0–34.0)
MCHC: 32.9 g/dL (ref 30.0–36.0)
MCV: 94.6 fL (ref 80.0–100.0)
Platelets: 138 10*3/uL — ABNORMAL LOW (ref 150–400)
RBC: 2.96 MIL/uL — ABNORMAL LOW (ref 4.22–5.81)
RDW: 13.5 % (ref 11.5–15.5)
WBC: 9.3 10*3/uL (ref 4.0–10.5)
nRBC: 0 % (ref 0.0–0.2)

## 2023-01-21 LAB — BASIC METABOLIC PANEL
Anion gap: 6 (ref 5–15)
BUN: 27 mg/dL — ABNORMAL HIGH (ref 8–23)
CO2: 24 mmol/L (ref 22–32)
Calcium: 8.2 mg/dL — ABNORMAL LOW (ref 8.9–10.3)
Chloride: 109 mmol/L (ref 98–111)
Creatinine, Ser: 1.28 mg/dL — ABNORMAL HIGH (ref 0.61–1.24)
GFR, Estimated: 58 mL/min — ABNORMAL LOW (ref >60)
Glucose, Bld: 92 mg/dL (ref 70–99)
Potassium: 3.7 mmol/L (ref 3.5–5.1)
Sodium: 139 mmol/L (ref 135–145)

## 2023-01-21 MED ORDER — POTASSIUM CHLORIDE CRYS ER 20 MEQ PO TBCR
40.0000 meq | EXTENDED_RELEASE_TABLET | Freq: Once | ORAL | Status: AC
  Administered 2023-01-21: 40 meq via ORAL
  Filled 2023-01-21: qty 2

## 2023-01-21 NOTE — Discharge Summary (Signed)
Physician Discharge Summary   Patient: James [name] MRN: [id number] DOB: [birth year]  Admit date:     01/20/2023  Discharge date: 01/21/23  Discharge Physician: Marcelino Duster   PCP: Joaquim Nam, MD   Recommendations at discharge:    PCP in 1 week.  Orthopedic clinic follow up as scheduled. Avoid opiates/ muscle relaxants if possible.  Discharge Diagnoses: Principal Problem:   Near syncope Active Problems:   Severe sepsis (HCC)   Hypothyroidism   Essential hypertension   Hematuria   Pain and swelling of left knee   Persistent atrial fibrillation (HCC)   Dyspnea on exertion   Benign prostatic hyperplasia with nocturia   CHF (congestive heart failure) (HCC)   Coronary artery disease involving native coronary artery of native heart without angina pectoris   Obstructive sleep apnea   Chronic heart failure with preserved ejection fraction (HFpEF) (HCC)   Hyperlipidemia LDL goal <70   Afib (HCC)   S/P total knee replacement, left   AKI (acute kidney injury) (HCC)   Septic joint of left knee joint (HCC)   Elevated lactic acid level  Resolved Problems:   * No resolved hospital problems. [facility] Course: [name] is a 77 year old male with history of hyperlipidemia, persistent atrial fibrillation on Eliquis, history of thoracic aortic aneurysm without dissection, heart failure preserved ejection fraction, hypertension, hypothyroid, obesity, CAD status post PCI to RCA in 2019, who presents to the emergency department for chief concerns of lightheadedness, shortness of breath.  Patient was admitted to the hospitalist service with a concern for sepsis possibly due to left knee septic joint status post recent total knee replacement.  Patient is started on broad-spectrum antibiotics, IV hydration, orthopedic consulted.  Patient's symptoms improved, kidney function improved, lactic acid trended down.  Procalcitonin normal.  Orthopedic surgery examined the patient  recommended low suspicion for joint infection.  Patient's orthostatic vitals negative.  Telemetry unremarkable.  He is hemodynamically stable to be discharged home.  I discussed with patient regarding limiting pain medications including opiates, muscle relaxants if possible.  Patient and his wife understand and agree with the discharge plan.  Assessment and Plan: * Near syncope Etiology possible polypharmacy, dehydration. Infection ruled out. Orthostatic vitals negative. Telemetry unremarkable. Educated regarding pian meds.  SIRS- In the setting of dehydration, stress, post op. Blood cultures x 2 negative, lactic acid trended down. Procalcitonin normal.  Elevated lactic acid level No infection/ sepsis. Seen by ortho team.  Septic joint of left knee joint ruled out Pain and swelling of left knee Orthopedic surgeon evaluation appreciated. Low suspicion for joint infection. Advised if erythema, swelling worsen or fever/ chills need to come to ED.  AKI (acute kidney injury) (HCC) Resolved with fluids. Home meds resumed.  S/P total knee replacement, left Patient is status post left total knee replacement on 01/17/2023.  Afib (HCC) Continue Tikosyn, Eliquis.  Hematuria Resolved. If worsening advised to stop Eliquis.  Essential hypertension BP stable, resume home meds.       Consultants: Orthopedic surgery Procedures performed: None Disposition: Home Diet recommendation:  Discharge Diet Orders (From admission, onward)     Start     Ordered   01/21/23 0000  Diet - low sodium heart healthy        01/21/23 1330           Cardiac diet DISCHARGE MEDICATION: Allergies as of 01/21/2023       Reactions   Sulfonamide Derivatives Rash   childhood  Medication List     TAKE these medications    acetaminophen 500 MG tablet Commonly known as: TYLENOL Take 1,000 mg by mouth every 6 (six) hours as needed for mild pain or moderate pain.   apixaban 5 MG Tabs  tablet Commonly known as: Eliquis Take 1 tablet (5 mg total) by mouth 2 (two) times daily.   atorvastatin 80 MG tablet Commonly known as: LIPITOR TAKE 1 TABLET BY MOUTH DAILY AT 6PM   carboxymethylcellulose 0.5 % Soln Commonly known as: REFRESH PLUS Place 1-2 drops into both eyes 3 (three) times daily as needed (dry/irritated eyes.).   diclofenac Sodium 1 % Gel Commonly known as: VOLTAREN Apply 2 g topically 4 (four) times daily as needed. What changed: when to take this   docusate sodium 100 MG capsule Commonly known as: COLACE Take 1 capsule (100 mg total) by mouth 2 (two) times daily.   dofetilide 500 MCG capsule Commonly known as: TIKOSYN Take 1 capsule (500 mcg total) by mouth 2 (two) times daily.   doxazosin 4 MG tablet Commonly known as: CARDURA TAKE 1 TABLET BY MOUTH DAILY AFTER BREAKFAST   ezetimibe 10 MG tablet Commonly known as: ZETIA TAKE 1 TABLET BY MOUTH DAILY   finasteride 5 MG tablet Commonly known as: PROSCAR TAKE 1 TABLET BY MOUTH EVERY DAY   furosemide 20 MG tablet Commonly known as: LASIX TAKE 1 TABLET BY MOUTH EVERY OTHER DAY, GENERIC EQUIVALENT FOR LASIX   levothyroxine 125 MCG tablet Commonly known as: SYNTHROID TAKE 1 TABLET BY MOUTH DAILY BEFORE BREAKFAST. What changed:  when to take this additional instructions   lidocaine 5 % Commonly known as: Lidoderm Place 1 patch onto the skin daily. Remove & Discard patch within 12 hours or as directed by MD   methocarbamol 500 MG tablet Commonly known as: ROBAXIN Take 1 tablet (500 mg total) by mouth every 8 (eight) hours as needed for muscle spasms.   metoprolol succinate 25 MG 24 hr tablet Commonly known as: Toprol XL Take 1 tablet (25 mg total) by mouth daily.   multivitamin with minerals Tabs tablet Take 1 tablet by mouth daily. Senior Multivitamin   oxyCODONE 5 MG immediate release tablet Commonly known as: Oxy IR/ROXICODONE Take 1 tablet (5 mg total) by mouth every 4 (four) hours  as needed for moderate pain (pain score 4-6).   psyllium 58.6 % packet Commonly known as: METAMUCIL Take 1 packet by mouth daily as needed (constipation).   ramipril 10 MG capsule Commonly known as: ALTACE TAKE ONE CAPSULE BY MOUTH DAILY. GENERIC EQUIVALENT FOR ALTACE        Discharge Exam: Filed Weights   01/20/23 1120  Weight: 113.4 kg   General - Elderly obese Caucasian male, no apparent distress HEENT - PERRLA, EOMI, atraumatic head, non tender sinuses. Lung - Clear, rales, rhonchi, wheezes. Heart - S1, S2 heard, no murmurs, rubs, trace pedal edema Neuro - Alert, awake and oriented x 3, non focal exam. Skin - Warm and dry. Extremities-left knee dressing noted, immobilizer intact  Condition at discharge: stable  The results of significant diagnostics from this hospitalization (including imaging, microbiology, ancillary and laboratory) are listed below for reference.   Imaging Studies: US Venous Img Lower Unilateral Left  Result Date: 01/20/2023 CLINICAL DATA:  Left lower extremity pain and edema. Evaluate for DVT. EXAM: LEFT LOWER EXTREMITY VENOUS DOPPLER ULTRASOUND TECHNIQUE: Gray-scale sonography with graded compression, as well as color Doppler and duplex ultrasound were performed to evaluate the lower extremity deep  venous systems from the level of the common femoral vein and including the common femoral, femoral, profunda femoral, popliteal and calf veins including the posterior tibial, peroneal and gastrocnemius veins when visible. The superficial great saphenous vein was also interrogated. Spectral Doppler was utilized to evaluate flow at rest and with distal augmentation maneuvers in the common femoral, femoral and popliteal veins. COMPARISON:  None Available. FINDINGS: Contralateral Common Femoral Vein: Respiratory phasicity is normal and symmetric with the symptomatic side. No evidence of thrombus. Normal compressibility. Common Femoral Vein: No evidence of thrombus.  Normal compressibility, respiratory phasicity and response to augmentation. Saphenofemoral Junction: No evidence of thrombus. Normal compressibility and flow on color Doppler imaging. Profunda Femoral Vein: No evidence of thrombus. Normal compressibility and flow on color Doppler imaging. Femoral Vein: No evidence of thrombus. Normal compressibility, respiratory phasicity and response to augmentation. Popliteal Vein: No evidence of thrombus. Normal compressibility, respiratory phasicity and response to augmentation. Calf Veins: No evidence of thrombus. Normal compressibility and flow on color Doppler imaging. Superficial Great Saphenous Vein: No evidence of thrombus. Normal compressibility. Other Findings:  None. IMPRESSION: No evidence of DVT within the left lower extremity. Electronically Signed   By: Simonne Come M.D.   On: 01/20/2023 15:40   CT Angio Chest PE W and/or Wo Contrast  Result Date: 01/20/2023 CLINICAL DATA:  Pulmonary embolism suspected, high probability. Lightheadedness and shortness of breath. EXAM: CT ANGIOGRAPHY CHEST WITH CONTRAST TECHNIQUE: Multidetector CT imaging of the chest was performed using the standard protocol during bolus administration of intravenous contrast. Multiplanar CT image reconstructions and MIPs were obtained to evaluate the vascular anatomy. RADIATION DOSE REDUCTION: This exam was performed according to the departmental dose-optimization program which includes automated exposure control, adjustment of the mA and/or kV according to patient size and/or use of iterative reconstruction technique. CONTRAST:  60mL OMNIPAQUE IOHEXOL 350 MG/ML SOLN COMPARISON:  CT angio chest 12/06/2022 FINDINGS: Cardiovascular: The heart size is normal. Coronary artery calcifications are present. The ascending thoracic aorta is stable measuring 4.7 cm. Atherosclerotic calcifications are present at the aortic arch and great vessel origins without significant focal stenosis. Mediastinum/Nodes:  Pulmonary artery opacification is excellent. No focal filling defects are present to suggest pulmonary embolus. Pulmonary artery size is normal. Lungs/Pleura: Lungs are clear. No pleural effusion or pneumothorax. Upper Abdomen: Limited imaging the abdomen is unremarkable. There is no significant adenopathy. No solid organ lesions are present. A small hiatal hernia is present. Musculoskeletal: No chest wall abnormality. No acute or significant osseous findings. Review of the MIP images confirms the above findings. IMPRESSION: 1. No pulmonary embolus. 2. No acute or focal lesion to explain the patient's symptoms. 3. Stable 4.7 cm ascending thoracic aortic aneurysm. Ascending thoracic aortic aneurysm. Recommend semi-annual imaging followup by CTA or MRA and referral to cardiothoracic surgery if not already obtained. This recommendation follows 2010 ACCF/AHA/AATS/ACR/ASA/SCA/SCAI/SIR/STS/SVM Guidelines for the Diagnosis and Management of Patients With Thoracic Aortic Disease. Circulation. 2010; 121: Z610-R604. Aortic aneurysm NOS (ICD10-I71.9) Coronary artery disease. 4. Small hiatal hernia. 5.  Aortic Atherosclerosis (ICD10-I70.0). Electronically Signed   By: Marin Roberts M.D.   On: 01/20/2023 13:22   DG Chest Portable 1 View  Result Date: 01/20/2023 CLINICAL DATA:  dyspnea EXAM: PORTABLE CHEST 1 VIEW COMPARISON:  Dec 06, 2022, July 08, 2021 FINDINGS: The cardiomediastinal silhouette is unchanged in contour. No pleural effusion. No pneumothorax. No acute pleuroparenchymal abnormality. IMPRESSION: No acute cardiopulmonary abnormality. Electronically Signed   By: Meda Klinefelter M.D.   On: 01/20/2023 12:11  DG Knee Complete 4 Views Left  Result Date: 01/19/2023 CLINICAL DATA:  Postoperative day 2 following total left knee joint replacement with pain unresponsive to oral medications. EXAM: LEFT KNEE - COMPLETE 4+ VIEW COMPARISON:  Preoperative AP Lat 10/25/2022. There is no postoperative study other  than today's. FINDINGS: There is a noncemented total joint arthroplasty. No fracture or acute perihardware complications suspected. There is osteopenia. No destructive bone lesion is seen. Superficial soft tissues show mild edema greater laterally. There is moderate fluid distending the suprapatellar bursa. The fluid contains multiple small air locules. There is additional scattered intra-articular air in the infrapatellar space. This soon after surgery, the air containing fluid could be a benign postoperative effusion with air left over from the surgical procedure, or could be due to an infectious complication. Clinical correlation and follow-up advised. There are calcifications of the superficial femoral and posterior tibial arteries. IMPRESSION: 1. No fracture or acute perihardware complications of total joint arthroplasty. 2. Moderate fluid distending the suprapatellar bursa with multiple small air locules. This soon after surgery, the air containing fluid could be a benign postoperative effusion with air left over from the surgical procedure, or could be due to an infectious complication. Electronically Signed   By: Almira Bar M.D.   On: 01/19/2023 06:15    Microbiology: Results for orders placed or performed during the hospital encounter of 01/20/23  Resp Panel by RT-PCR (Flu A&B, Covid) Anterior Nasal Swab     Status: None   Collection Time: 01/20/23 11:55 AM   Specimen: Anterior Nasal Swab  Result Value Ref Range Status   SARS Coronavirus 2 by RT PCR NEGATIVE NEGATIVE Final    Comment: (NOTE) SARS-CoV-2 target nucleic acids are NOT DETECTED.  The SARS-CoV-2 RNA is generally detectable in upper respiratory specimens during the acute phase of infection. The lowest concentration of SARS-CoV-2 viral copies this assay can detect is 138 copies/mL. A negative result does not preclude SARS-Cov-2 infection and should not be used as the sole basis for treatment or other patient management  decisions. A negative result may occur with  improper specimen collection/handling, submission of specimen other than nasopharyngeal swab, presence of viral mutation(s) within the areas targeted by this assay, and inadequate number of viral copies(<138 copies/mL). A negative result must be combined with clinical observations, patient history, and epidemiological information. The expected result is Negative.  Fact Sheet for Patients:  BloggerCourse.com  Fact Sheet for Healthcare Providers:  SeriousBroker.it  This test is no t yet approved or cleared by the [place] FDA and  has been authorized for detection and/or diagnosis of SARS-CoV-2 by FDA under an Emergency Use Authorization (EUA). This EUA will remain  in effect (meaning this test can be used) for the duration of the COVID-19 declaration under Section 564(b)(1) of the Act, 21 U.S.C.section 360bbb-3(b)(1), unless the authorization is terminated  or revoked sooner.       Influenza A by PCR NEGATIVE NEGATIVE Final   Influenza B by PCR NEGATIVE NEGATIVE Final    Comment: (NOTE) The Xpert Xpress SARS-CoV-2/FLU/RSV plus assay is intended as an aid in the diagnosis of influenza from Nasopharyngeal swab specimens and should not be used as a sole basis for treatment. Nasal washings and aspirates are unacceptable for Xpert Xpress SARS-CoV-2/FLU/RSV testing.  Fact Sheet for Patients: BloggerCourse.com  Fact Sheet for Healthcare Providers: SeriousBroker.it  This test is not yet approved or cleared by the [place] FDA and has been authorized for detection and/or diagnosis of SARS-CoV-2 by  FDA under an Emergency Use Authorization (EUA). This EUA will remain in effect (meaning this test can be used) for the duration of the COVID-19 declaration under Section 564(b)(1) of the Act, 21 U.S.C. section 360bbb-3(b)(1), unless the  authorization is terminated or revoked.  Performed at [facility], [address] [place], [place] [address]   Culture, blood (Routine X 2) w Reflex to ID Panel     Status: None (Preliminary result)   Collection Time: 01/20/23  5:40 PM   Specimen: BLOOD  Result Value Ref Range Status   Specimen Description BLOOD BLOOD LEFT ARM  Final   Special Requests   Final    BOTTLES DRAWN AEROBIC AND ANAEROBIC Blood Culture adequate volume   Culture   Final    NO GROWTH < 24 HOURS Performed at [facility], [address] [place], [place] [address]    Report Status PENDING  Incomplete  Culture, blood (Routine X 2) w Reflex to ID Panel     Status: None (Preliminary result)   Collection Time: 01/20/23  5:40 PM   Specimen: BLOOD  Result Value Ref Range Status   Specimen Description BLOOD LEFT ANTECUBITAL  Final   Special Requests   Final    BOTTLES DRAWN AEROBIC AND ANAEROBIC Blood Culture adequate volume   Culture   Final    NO GROWTH < 24 HOURS Performed at [facility], [address] [place], [place] [address]    Report Status PENDING  Incomplete    Labs: CBC: Recent Labs  Lab 01/20/23 1155 01/21/23 0530  WBC 11.5* 9.3  NEUTROABS 9.6*  --   HGB 11.6* 9.2*  HCT 35.6* 28.0*  MCV 94.4 94.6  PLT 153 138*   Basic Metabolic Panel: Recent Labs  Lab 01/18/23 0211 01/20/23 1155 01/21/23 0530  NA 139 137 139  K 4.6 3.7 3.7  CL 107 104 109  CO2 24 22 24   GLUCOSE 115* 139* 92  BUN 20 30* 27*  CREATININE 1.39* 1.70* 1.28*  CALCIUM 8.8* 9.1 8.2*  MG 2.2  --   --    Liver Function Tests: No results for input(s): "AST", "ALT", "ALKPHOS", "BILITOT", "PROT", "ALBUMIN" in the last 168 hours. CBG: No results for input(s): "GLUCAP" in the last 168 hours.  Discharge time spent: greater than 30 minutes.  Signed: Marcelino Duster, MD [facility] 01/21/2023

## 2023-01-21 NOTE — Telephone Encounter (Signed)
Patient had left total knee on 01-17-23 with Dr. August Saucer and states he has not been contacted by physical therapy this time. Please call patient ASAP   [phone]

## 2023-01-22 ENCOUNTER — Telehealth: Payer: Self-pay | Admitting: *Deleted

## 2023-01-22 LAB — CULTURE, BLOOD (ROUTINE X 2): Culture: NO GROWTH

## 2023-01-22 NOTE — Telephone Encounter (Signed)
Order sent to Amy at [facility]

## 2023-01-22 NOTE — Transitions of Care (Post Inpatient/ED Visit) (Signed)
01/22/2023  Name: James [name] MRN: [id number] DOB: Jul 20, 1946  Today's TOC FU Call Status: Today's TOC FU Call Status:: Successful TOC FU Call Competed TOC FU Call Complete Date: 01/22/23  Transition Care Management Follow-up Telephone Call Date of Discharge: 01/21/23 Discharge Facility: [facility]) Type of Discharge: Inpatient Admission Primary Inpatient Discharge Diagnosis:: Syncope How have you been since you were released from the hospital?: Better Any questions or concerns?: Yes Patient Questions/Concerns:: I had never received my physical therapy when I was d/cd from the hospital Patient Questions/Concerns Addressed: Notified Provider of Patient Questions/Concerns  Items Reviewed: Did you receive and understand the discharge instructions provided?: Yes Medications obtained,verified, and reconciled?: Yes (Medications Reviewed) Any new allergies since your discharge?: No Dietary orders reviewed?: No Do you have support at home?: Yes People in Home: spouse Name of Support/Comfort Primary Source: [name]  Medications Reviewed Today: Medications Reviewed Today     Reviewed by [name], RN (Case Manager) on 01/22/23 at 1205  Med List Status: <None>   Medication Order Taking? Sig Documenting Provider Last Dose Status Informant  acetaminophen (TYLENOL) 500 MG tablet [id number] Yes Take 1,000 mg by mouth every 6 (six) hours as needed for mild pain or moderate pain. [provider] Taking Active Self  apixaban (ELIQUIS) 5 MG TABS tablet [id number] Yes Take 1 tablet (5 mg total) by mouth 2 (two) times daily. Joaquim Nam, MD Taking Active Self, Pharmacy Records  atorvastatin (LIPITOR) 80 MG tablet [id number] Yes TAKE 1 TABLET BY MOUTH DAILY AT Chip Boer, MD Taking Active Self, Pharmacy Records  carboxymethylcellulose (REFRESH PLUS) 0.5 % SOLN [id number] Yes Place 1-2 drops into both eyes 3 (three) times daily as needed  (dry/irritated eyes.). [provider] Taking Active Self  diclofenac Sodium (VOLTAREN) 1 % GEL [id number] Yes Apply 2 g topically 4 (four) times daily as needed.  Patient taking differently: Apply 2 g topically daily.   Joaquim Nam, MD Taking Active Self  docusate sodium (COLACE) 100 MG capsule [id number] Yes Take 1 capsule (100 mg total) by mouth 2 (two) times daily. Magnant, Joycie Peek, PA-C Taking Active Self, Pharmacy Records  dofetilide [facility]) 500 MCG capsule [id number] Yes Take 1 capsule (500 mcg total) by mouth 2 (two) times daily. Newman Nip, NP Taking Active Self, Pharmacy Records  doxazosin (CARDURA) 4 MG tablet [id number] Yes TAKE 1 TABLET BY MOUTH DAILY AFTER BREAKFAST Joaquim Nam, MD Taking Active Self, Pharmacy Records  ezetimibe (ZETIA) 10 MG tablet [id number] Yes TAKE 1 TABLET BY MOUTH DAILY End, Cristal Deer, MD Taking Active Self, Pharmacy Records  finasteride (PROSCAR) 5 MG tablet [id number] Yes TAKE 1 TABLET BY MOUTH EVERY DAY Joaquim Nam, MD Taking Active Self, Pharmacy Records  furosemide (LASIX) 20 MG tablet [id number] Yes TAKE 1 TABLET BY MOUTH EVERY OTHER DAY, GENERIC EQUIVALENT FOR LASIX Joaquim Nam, MD Taking Active Self, Pharmacy Records  levothyroxine (SYNTHROID) 125 MCG tablet [id number] Yes TAKE 1 TABLET BY MOUTH DAILY BEFORE BREAKFAST.  Patient taking differently: Take 125 mcg by mouth See admin instructions. Take 1 tablet (125 mcg) by mouth on Mondays, Tuesdays, Wednesdays, Thursdays, Fridays and Saturdays ONLY. SKIP SUNDAYS. (0700)   Joaquim Nam, MD Taking Active Self, Pharmacy Records  lidocaine (LIDODERM) 5 % [id number]  Place 1 patch onto the skin daily. Remove & Discard patch within 12 hours or as directed by MD  Patient not taking: Reported on 01/20/2023   Cathren Laine, MD  Active Self  methocarbamol (ROBAXIN) 500 MG tablet [id number] Yes Take 1 tablet (500 mg total) by mouth every 8 (eight) hours as needed for muscle spasms.  Magnant, Joycie Peek, PA-C Taking Active Self, Pharmacy Records  metoprolol succinate (TOPROL XL) 25 MG 24 hr tablet [id number] Yes Take 1 tablet (25 mg total) by mouth daily. End, Cristal Deer, MD Taking Active Self, Pharmacy Records  Multiple Vitamin (MULTIVITAMIN WITH MINERALS) TABS tablet [id number] Yes Take 1 tablet by mouth daily. Senior Multivitamin [provider] Taking Active Self  oxyCODONE (OXY IR/ROXICODONE) 5 MG immediate release tablet [id number] Yes Take 1 tablet (5 mg total) by mouth every 4 (four) hours as needed for moderate pain (pain score 4-6). Magnant, Joycie Peek, PA-C Taking Active Self, Pharmacy Records  psyllium (METAMUCIL) 58.6 % packet [id number] Yes Take 1 packet by mouth daily as needed (constipation). [provider] Taking Active Self  ramipril (ALTACE) 10 MG capsule [id number] Yes TAKE ONE CAPSULE BY MOUTH DAILY. GENERIC EQUIVALENT FOR Burman Blacksmith, MD Taking Active Self, Pharmacy Records            Home Care and Equipment/Supplies: Were Home Health Services Ordered?: NA (Pt was ordered on [id number] admission. Patient never received. RN called  Iantha Fallen the company that was to send out PT. The inital call was sent to AMY. RN awaiting call back from Amy.) Any new equipment or medical supplies ordered?: No  Functional Questionnaire: Do you need assistance with bathing/showering or dressing?: Yes Do you need assistance with meal preparation?: Yes Do you need assistance with eating?: No Do you have difficulty maintaining continence: No Do you need assistance with getting out of bed/getting out of a chair/moving?: Yes Do you have difficulty managing or taking your medications?: No  Follow up appointments reviewed: PCP Follow-up appointment confirmed?: Yes Date of PCP follow-up appointment?: 01/29/23 Follow-up Provider: Dr Para March [facility] Follow-up appointment confirmed?: Yes Date of Specialist follow-up appointment?:  01/30/23 Follow-Up Specialty Provider:: Franky Macho Magnant 1:00 Do you need transportation to your follow-up appointment?: No Do you understand care options if your condition(s) worsen?: Yes-patient verbalized understanding  SDOH Interventions Today    Flowsheet Row Most Recent Value  SDOH Interventions   Food Insecurity Interventions Intervention Not Indicated  Housing Interventions Intervention Not Indicated  Transportation Interventions Intervention Not Indicated      Interventions Today    Flowsheet Row Most Recent Value  General Interventions   General Interventions Discussed/Reviewed General Interventions Discussed, General Interventions Reviewed, Doctor Visits  Doctor Visits Discussed/Reviewed Doctor Visits Discussed, Doctor Visits Reviewed  Exercise Interventions   Exercise Discussed/Reviewed Exercise Discussed  [Patient is to receive physicial therapy]  Pharmacy Interventions   Pharmacy Dicussed/Reviewed Pharmacy Topics Discussed      TOC Interventions Today    Flowsheet Row Most Recent Value  TOC Interventions   TOC Interventions Discussed/Reviewed TOC Interventions Discussed, TOC Interventions Reviewed, Arranged PCP follow up within 7 days/Care Guide scheduled, Contacted Home Health RN/OT/PT  [RN contacted [facility] regaring physical therapy patient was to receive.]      Gean Maidens BSN RN [facility] [phone]

## 2023-01-23 DIAGNOSIS — E039 Hypothyroidism, unspecified: Secondary | ICD-10-CM | POA: Diagnosis not present

## 2023-01-23 DIAGNOSIS — E785 Hyperlipidemia, unspecified: Secondary | ICD-10-CM | POA: Diagnosis not present

## 2023-01-23 DIAGNOSIS — E669 Obesity, unspecified: Secondary | ICD-10-CM | POA: Diagnosis not present

## 2023-01-23 DIAGNOSIS — Z96652 Presence of left artificial knee joint: Secondary | ICD-10-CM | POA: Diagnosis not present

## 2023-01-23 DIAGNOSIS — Z471 Aftercare following joint replacement surgery: Secondary | ICD-10-CM | POA: Diagnosis not present

## 2023-01-23 DIAGNOSIS — M7918 Myalgia, other site: Secondary | ICD-10-CM | POA: Diagnosis not present

## 2023-01-23 DIAGNOSIS — N401 Enlarged prostate with lower urinary tract symptoms: Secondary | ICD-10-CM | POA: Diagnosis not present

## 2023-01-23 DIAGNOSIS — Z9181 History of falling: Secondary | ICD-10-CM | POA: Diagnosis not present

## 2023-01-23 DIAGNOSIS — I25110 Atherosclerotic heart disease of native coronary artery with unstable angina pectoris: Secondary | ICD-10-CM | POA: Diagnosis not present

## 2023-01-23 DIAGNOSIS — G4733 Obstructive sleep apnea (adult) (pediatric): Secondary | ICD-10-CM | POA: Diagnosis not present

## 2023-01-23 DIAGNOSIS — R351 Nocturia: Secondary | ICD-10-CM | POA: Diagnosis not present

## 2023-01-23 DIAGNOSIS — I110 Hypertensive heart disease with heart failure: Secondary | ICD-10-CM | POA: Diagnosis not present

## 2023-01-23 DIAGNOSIS — I080 Rheumatic disorders of both mitral and aortic valves: Secondary | ICD-10-CM | POA: Diagnosis not present

## 2023-01-23 DIAGNOSIS — Z86010 Personal history of colonic polyps: Secondary | ICD-10-CM | POA: Diagnosis not present

## 2023-01-23 DIAGNOSIS — I700 Atherosclerosis of aorta: Secondary | ICD-10-CM | POA: Diagnosis not present

## 2023-01-23 DIAGNOSIS — Z85828 Personal history of other malignant neoplasm of skin: Secondary | ICD-10-CM | POA: Diagnosis not present

## 2023-01-23 DIAGNOSIS — Z7901 Long term (current) use of anticoagulants: Secondary | ICD-10-CM | POA: Diagnosis not present

## 2023-01-23 DIAGNOSIS — I5032 Chronic diastolic (congestive) heart failure: Secondary | ICD-10-CM | POA: Diagnosis not present

## 2023-01-23 DIAGNOSIS — I4819 Other persistent atrial fibrillation: Secondary | ICD-10-CM | POA: Diagnosis not present

## 2023-01-23 DIAGNOSIS — K449 Diaphragmatic hernia without obstruction or gangrene: Secondary | ICD-10-CM | POA: Diagnosis not present

## 2023-01-23 DIAGNOSIS — Z6833 Body mass index (BMI) 33.0-33.9, adult: Secondary | ICD-10-CM | POA: Diagnosis not present

## 2023-01-23 DIAGNOSIS — Z87891 Personal history of nicotine dependence: Secondary | ICD-10-CM | POA: Diagnosis not present

## 2023-01-23 DIAGNOSIS — I7121 Aneurysm of the ascending aorta, without rupture: Secondary | ICD-10-CM | POA: Diagnosis not present

## 2023-01-23 LAB — CULTURE, BLOOD (ROUTINE X 2): Special Requests: ADEQUATE

## 2023-01-24 LAB — CULTURE, BLOOD (ROUTINE X 2)
Culture: NO GROWTH
Special Requests: ADEQUATE

## 2023-01-25 DIAGNOSIS — I5032 Chronic diastolic (congestive) heart failure: Secondary | ICD-10-CM | POA: Diagnosis not present

## 2023-01-25 DIAGNOSIS — Z96652 Presence of left artificial knee joint: Secondary | ICD-10-CM | POA: Diagnosis not present

## 2023-01-25 DIAGNOSIS — I4819 Other persistent atrial fibrillation: Secondary | ICD-10-CM | POA: Diagnosis not present

## 2023-01-25 DIAGNOSIS — I700 Atherosclerosis of aorta: Secondary | ICD-10-CM | POA: Diagnosis not present

## 2023-01-25 DIAGNOSIS — N401 Enlarged prostate with lower urinary tract symptoms: Secondary | ICD-10-CM | POA: Diagnosis not present

## 2023-01-25 DIAGNOSIS — I7121 Aneurysm of the ascending aorta, without rupture: Secondary | ICD-10-CM | POA: Diagnosis not present

## 2023-01-25 DIAGNOSIS — I080 Rheumatic disorders of both mitral and aortic valves: Secondary | ICD-10-CM | POA: Diagnosis not present

## 2023-01-25 DIAGNOSIS — E785 Hyperlipidemia, unspecified: Secondary | ICD-10-CM | POA: Diagnosis not present

## 2023-01-25 DIAGNOSIS — Z471 Aftercare following joint replacement surgery: Secondary | ICD-10-CM | POA: Diagnosis not present

## 2023-01-25 DIAGNOSIS — G4733 Obstructive sleep apnea (adult) (pediatric): Secondary | ICD-10-CM | POA: Diagnosis not present

## 2023-01-25 DIAGNOSIS — R351 Nocturia: Secondary | ICD-10-CM | POA: Diagnosis not present

## 2023-01-25 DIAGNOSIS — M7918 Myalgia, other site: Secondary | ICD-10-CM | POA: Diagnosis not present

## 2023-01-25 DIAGNOSIS — K449 Diaphragmatic hernia without obstruction or gangrene: Secondary | ICD-10-CM | POA: Diagnosis not present

## 2023-01-25 DIAGNOSIS — I25110 Atherosclerotic heart disease of native coronary artery with unstable angina pectoris: Secondary | ICD-10-CM | POA: Diagnosis not present

## 2023-01-25 DIAGNOSIS — I110 Hypertensive heart disease with heart failure: Secondary | ICD-10-CM | POA: Diagnosis not present

## 2023-01-25 DIAGNOSIS — E039 Hypothyroidism, unspecified: Secondary | ICD-10-CM | POA: Diagnosis not present

## 2023-01-28 DIAGNOSIS — R351 Nocturia: Secondary | ICD-10-CM | POA: Diagnosis not present

## 2023-01-28 DIAGNOSIS — G4733 Obstructive sleep apnea (adult) (pediatric): Secondary | ICD-10-CM | POA: Diagnosis not present

## 2023-01-28 DIAGNOSIS — I700 Atherosclerosis of aorta: Secondary | ICD-10-CM | POA: Diagnosis not present

## 2023-01-28 DIAGNOSIS — K449 Diaphragmatic hernia without obstruction or gangrene: Secondary | ICD-10-CM | POA: Diagnosis not present

## 2023-01-28 DIAGNOSIS — I25110 Atherosclerotic heart disease of native coronary artery with unstable angina pectoris: Secondary | ICD-10-CM | POA: Diagnosis not present

## 2023-01-28 DIAGNOSIS — I5032 Chronic diastolic (congestive) heart failure: Secondary | ICD-10-CM | POA: Diagnosis not present

## 2023-01-28 DIAGNOSIS — I080 Rheumatic disorders of both mitral and aortic valves: Secondary | ICD-10-CM | POA: Diagnosis not present

## 2023-01-28 DIAGNOSIS — I7121 Aneurysm of the ascending aorta, without rupture: Secondary | ICD-10-CM | POA: Diagnosis not present

## 2023-01-28 DIAGNOSIS — N401 Enlarged prostate with lower urinary tract symptoms: Secondary | ICD-10-CM | POA: Diagnosis not present

## 2023-01-28 DIAGNOSIS — E039 Hypothyroidism, unspecified: Secondary | ICD-10-CM | POA: Diagnosis not present

## 2023-01-28 DIAGNOSIS — I110 Hypertensive heart disease with heart failure: Secondary | ICD-10-CM | POA: Diagnosis not present

## 2023-01-28 DIAGNOSIS — Z471 Aftercare following joint replacement surgery: Secondary | ICD-10-CM | POA: Diagnosis not present

## 2023-01-28 DIAGNOSIS — I4819 Other persistent atrial fibrillation: Secondary | ICD-10-CM | POA: Diagnosis not present

## 2023-01-28 DIAGNOSIS — M7918 Myalgia, other site: Secondary | ICD-10-CM | POA: Diagnosis not present

## 2023-01-28 DIAGNOSIS — Z96652 Presence of left artificial knee joint: Secondary | ICD-10-CM | POA: Diagnosis not present

## 2023-01-28 DIAGNOSIS — E785 Hyperlipidemia, unspecified: Secondary | ICD-10-CM | POA: Diagnosis not present

## 2023-01-28 NOTE — Discharge Summary (Signed)
Physician Discharge Summary      Patient ID: James [name] MRN: [id number] DOB/AGE: 1945-12-26 77 y.o.  Admit date: 01/17/2023 Discharge date: 01/18/2023  Admission Diagnoses:  Principal Problem:   OA (osteoarthritis) of knee Active Problems:   S/P total knee replacement, left   Discharge Diagnoses:  Same  Surgeries: Procedure(s): LEFT TOTAL KNEE ARTHROPLASTY on 01/17/2023   Consultants:   Discharged Condition: Stable  Hospital Course: James [name] is an 76 y.o. male who was admitted 01/17/2023 with a chief complaint of left knee pain, and found to have a diagnosis of left knee osteoarthritis.  They were brought to the operating room on 01/17/2023 and underwent the above named procedures.  Pt awoke from anesthesia without complication and was transferred to the floor. On POD1, patient's pain was overall controlled.  No red flag signs or symptoms.  He was able to mobilize well with physical therapy.  Discharge home on POD 1..  Pt will f/u with Dr. August Saucer in clinic in ~2 weeks.   Antibiotics given:  Anti-infectives (From admission, onward)    Start     Dose/Rate Route Frequency Ordered Stop   01/17/23 1500  ceFAZolin (ANCEF) IVPB 2g/100 mL premix        2 g 200 mL/hr over 30 Minutes Intravenous Every 8 hours 01/17/23 1302 01/17/23 2220   01/17/23 0932  vancomycin (VANCOCIN) powder  Status:  Discontinued          As needed 01/17/23 0932 01/17/23 1040   01/17/23 0600  ceFAZolin (ANCEF) IVPB 2g/100 mL premix        2 g 200 mL/hr over 30 Minutes Intravenous On call to O.R. 01/17/23 6962 01/17/23 9528     .  Recent vital signs:  Vitals:   01/18/23 0539 01/18/23 1256  BP: (!) 140/71 122/66  Pulse: (!) 49 (!) 54  Resp: 14 18  Temp: 99.6 F (37.6 C) 97.6 F (36.4 C)  SpO2: 99% 98%    Recent laboratory studies:  Results for orders placed or performed during the hospital encounter of 01/17/23  Basic metabolic panel  Result Value Ref Range   Sodium 139 135 - 145 mmol/L    Potassium 4.6 3.5 - 5.1 mmol/L   Chloride 107 98 - 111 mmol/L   CO2 24 22 - 32 mmol/L   Glucose, Bld 115 (H) 70 - 99 mg/dL   BUN 20 8 - 23 mg/dL   Creatinine, Ser 4.13 (H) 0.61 - 1.24 mg/dL   Calcium 8.8 (L) 8.9 - 10.3 mg/dL   GFR, Estimated 53 (L) >60 mL/min   Anion gap 8 5 - 15  Magnesium  Result Value Ref Range   Magnesium 2.2 1.7 - 2.4 mg/dL    Discharge Medications:   Allergies as of 01/18/2023       Reactions   Sulfonamide Derivatives Rash   childhood        Medication List     STOP taking these medications    ibuprofen 200 MG tablet Commonly known as: ADVIL       TAKE these medications    apixaban 5 MG Tabs tablet Commonly known as: Eliquis Take 1 tablet (5 mg total) by mouth 2 (two) times daily.   atorvastatin 80 MG tablet Commonly known as: LIPITOR TAKE 1 TABLET BY MOUTH DAILY AT 6PM   carboxymethylcellulose 0.5 % Soln Commonly known as: REFRESH PLUS Place 1-2 drops into both eyes 3 (three) times daily as needed (dry/irritated eyes.).   diclofenac Sodium 1 %  Gel Commonly known as: VOLTAREN Apply 2 g topically 4 (four) times daily as needed. What changed: when to take this   docusate sodium 100 MG capsule Commonly known as: COLACE Take 1 capsule (100 mg total) by mouth 2 (two) times daily.   dofetilide 500 MCG capsule Commonly known as: TIKOSYN Take 1 capsule (500 mcg total) by mouth 2 (two) times daily.   doxazosin 4 MG tablet Commonly known as: CARDURA TAKE 1 TABLET BY MOUTH DAILY AFTER BREAKFAST   ezetimibe 10 MG tablet Commonly known as: ZETIA TAKE 1 TABLET BY MOUTH DAILY   finasteride 5 MG tablet Commonly known as: PROSCAR TAKE 1 TABLET BY MOUTH EVERY DAY   furosemide 20 MG tablet Commonly known as: LASIX TAKE 1 TABLET BY MOUTH EVERY OTHER DAY, GENERIC EQUIVALENT FOR LASIX   levothyroxine 125 MCG tablet Commonly known as: SYNTHROID TAKE 1 TABLET BY MOUTH DAILY BEFORE BREAKFAST. What changed:  when to take  this additional instructions   methocarbamol 500 MG tablet Commonly known as: ROBAXIN Take 1 tablet (500 mg total) by mouth every 8 (eight) hours as needed for muscle spasms.   metoprolol succinate 25 MG 24 hr tablet Commonly known as: Toprol XL Take 1 tablet (25 mg total) by mouth daily.   multivitamin with minerals Tabs tablet Take 1 tablet by mouth daily. Senior Multivitamin   oxyCODONE 5 MG immediate release tablet Commonly known as: Oxy IR/ROXICODONE Take 1 tablet (5 mg total) by mouth every 4 (four) hours as needed for moderate pain (pain score 4-6).   psyllium 58.6 % packet Commonly known as: METAMUCIL Take 1 packet by mouth daily as needed (constipation).   ramipril 10 MG capsule Commonly known as: ALTACE TAKE ONE CAPSULE BY MOUTH DAILY. GENERIC EQUIVALENT FOR ALTACE        Diagnostic Studies: US Venous Img Lower Unilateral Left  Result Date: 01/20/2023 CLINICAL DATA:  Left lower extremity pain and edema. Evaluate for DVT. EXAM: LEFT LOWER EXTREMITY VENOUS DOPPLER ULTRASOUND TECHNIQUE: Gray-scale sonography with graded compression, as well as color Doppler and duplex ultrasound were performed to evaluate the lower extremity deep venous systems from the level of the common femoral vein and including the common femoral, femoral, profunda femoral, popliteal and calf veins including the posterior tibial, peroneal and gastrocnemius veins when visible. The superficial great saphenous vein was also interrogated. Spectral Doppler was utilized to evaluate flow at rest and with distal augmentation maneuvers in the common femoral, femoral and popliteal veins. COMPARISON:  None Available. FINDINGS: Contralateral Common Femoral Vein: Respiratory phasicity is normal and symmetric with the symptomatic side. No evidence of thrombus. Normal compressibility. Common Femoral Vein: No evidence of thrombus. Normal compressibility, respiratory phasicity and response to augmentation. Saphenofemoral  Junction: No evidence of thrombus. Normal compressibility and flow on color Doppler imaging. Profunda Femoral Vein: No evidence of thrombus. Normal compressibility and flow on color Doppler imaging. Femoral Vein: No evidence of thrombus. Normal compressibility, respiratory phasicity and response to augmentation. Popliteal Vein: No evidence of thrombus. Normal compressibility, respiratory phasicity and response to augmentation. Calf Veins: No evidence of thrombus. Normal compressibility and flow on color Doppler imaging. Superficial Great Saphenous Vein: No evidence of thrombus. Normal compressibility. Other Findings:  None. IMPRESSION: No evidence of DVT within the left lower extremity. Electronically Signed   By: Simonne Come M.D.   On: 01/20/2023 15:40   CT Angio Chest PE W and/or Wo Contrast  Result Date: 01/20/2023 CLINICAL DATA:  Pulmonary embolism suspected, high probability. Lightheadedness and  shortness of breath. EXAM: CT ANGIOGRAPHY CHEST WITH CONTRAST TECHNIQUE: Multidetector CT imaging of the chest was performed using the standard protocol during bolus administration of intravenous contrast. Multiplanar CT image reconstructions and MIPs were obtained to evaluate the vascular anatomy. RADIATION DOSE REDUCTION: This exam was performed according to the departmental dose-optimization program which includes automated exposure control, adjustment of the mA and/or kV according to patient size and/or use of iterative reconstruction technique. CONTRAST:  60mL OMNIPAQUE IOHEXOL 350 MG/ML SOLN COMPARISON:  CT angio chest 12/06/2022 FINDINGS: Cardiovascular: The heart size is normal. Coronary artery calcifications are present. The ascending thoracic aorta is stable measuring 4.7 cm. Atherosclerotic calcifications are present at the aortic arch and great vessel origins without significant focal stenosis. Mediastinum/Nodes: Pulmonary artery opacification is excellent. No focal filling defects are present to suggest  pulmonary embolus. Pulmonary artery size is normal. Lungs/Pleura: Lungs are clear. No pleural effusion or pneumothorax. Upper Abdomen: Limited imaging the abdomen is unremarkable. There is no significant adenopathy. No solid organ lesions are present. A small hiatal hernia is present. Musculoskeletal: No chest wall abnormality. No acute or significant osseous findings. Review of the MIP images confirms the above findings. IMPRESSION: 1. No pulmonary embolus. 2. No acute or focal lesion to explain the patient's symptoms. 3. Stable 4.7 cm ascending thoracic aortic aneurysm. Ascending thoracic aortic aneurysm. Recommend semi-annual imaging followup by CTA or MRA and referral to cardiothoracic surgery if not already obtained. This recommendation follows 2010 ACCF/AHA/AATS/ACR/ASA/SCA/SCAI/SIR/STS/SVM Guidelines for the Diagnosis and Management of Patients With Thoracic Aortic Disease. Circulation. 2010; 121: N829-F621. Aortic aneurysm NOS (ICD10-I71.9) Coronary artery disease. 4. Small hiatal hernia. 5.  Aortic Atherosclerosis (ICD10-I70.0). Electronically Signed   By: Marin Roberts M.D.   On: 01/20/2023 13:22   DG Chest Portable 1 View  Result Date: 01/20/2023 CLINICAL DATA:  dyspnea EXAM: PORTABLE CHEST 1 VIEW COMPARISON:  Dec 06, 2022, July 08, 2021 FINDINGS: The cardiomediastinal silhouette is unchanged in contour. No pleural effusion. No pneumothorax. No acute pleuroparenchymal abnormality. IMPRESSION: No acute cardiopulmonary abnormality. Electronically Signed   By: Meda Klinefelter M.D.   On: 01/20/2023 12:11   DG Knee Complete 4 Views Left  Result Date: 01/19/2023 CLINICAL DATA:  Postoperative day 2 following total left knee joint replacement with pain unresponsive to oral medications. EXAM: LEFT KNEE - COMPLETE 4+ VIEW COMPARISON:  Preoperative AP Lat 10/25/2022. There is no postoperative study other than today's. FINDINGS: There is a noncemented total joint arthroplasty. No fracture or acute  perihardware complications suspected. There is osteopenia. No destructive bone lesion is seen. Superficial soft tissues show mild edema greater laterally. There is moderate fluid distending the suprapatellar bursa. The fluid contains multiple small air locules. There is additional scattered intra-articular air in the infrapatellar space. This soon after surgery, the air containing fluid could be a benign postoperative effusion with air left over from the surgical procedure, or could be due to an infectious complication. Clinical correlation and follow-up advised. There are calcifications of the superficial femoral and posterior tibial arteries. IMPRESSION: 1. No fracture or acute perihardware complications of total joint arthroplasty. 2. Moderate fluid distending the suprapatellar bursa with multiple small air locules. This soon after surgery, the air containing fluid could be a benign postoperative effusion with air left over from the surgical procedure, or could be due to an infectious complication. Electronically Signed   By: Almira Bar M.D.   On: 01/19/2023 06:15    Disposition: Discharge disposition: 01-Home or Self Care  Discharge Instructions     Call MD / Call 911   Complete by: As directed    If you experience chest pain or shortness of breath, CALL 911 and be transported to the hospital emergency room.  If you develope a fever above 101 F, pus (white drainage) or increased drainage or redness at the wound, or calf pain, call your surgeon's office.   Constipation Prevention   Complete by: As directed    Drink plenty of fluids.  Prune juice may be helpful.  You may use a stool softener, such as Colace (over the counter) 100 mg twice a day.  Use MiraLax (over the counter) for constipation as needed.   Diet - low sodium heart healthy   Complete by: As directed    Discharge instructions   Complete by: As directed    You may shower, dressing is waterproof.  Do not remove the  dressing, we will remove it at your first post-op appointment.  Do not take a bath or soak the knee in a tub or pool.  You may weightbear as you can tolerate on the operative leg with a walker.  Continue using the CPM machine 3 times per day for one hour each time, increasing the degrees of range of motion daily.  Use the blue cradle boot under your heel to work on getting your leg straight.  Do NOT put a pillow under your knee.  You will follow-up with Dr. August Saucer in the clinic in 2 weeks at your given appointment date.    INSTRUCTIONS AFTER JOINT REPLACEMENT   Remove items at home which could result in a fall. This includes throw rugs or furniture in walking pathways ICE to the affected joint every three hours while awake for 30 minutes at a time, for at least the first 3-5 days, and then as needed for pain and swelling.  Continue to use ice for pain and swelling. You may notice swelling that will progress down to the foot and ankle.  This is normal after surgery.  Elevate your leg when you are not up walking on it.   Continue to use the breathing machine you got in the hospital (incentive spirometer) which will help keep your temperature down.  It is common for your temperature to cycle up and down following surgery, especially at night when you are not up moving around and exerting yourself.  The breathing machine keeps your lungs expanded and your temperature down.   DIET:  As you were doing prior to hospitalization, we recommend a well-balanced diet.  DRESSING / WOUND CARE / SHOWERING  Keep the surgical dressing until follow up.  The dressing is water proof, so you can shower without any extra covering.  IF THE DRESSING FALLS OFF or the wound gets wet inside, change the dressing with sterile gauze.  Please use good hand washing techniques before changing the dressing.  Do not use any lotions or creams on the incision until instructed by your surgeon.    ACTIVITY  Increase activity slowly as  tolerated, but follow the weight bearing instructions below.   No driving for 6 weeks or until further direction given by your physician.  You cannot drive while taking narcotics.  No lifting or carrying greater than 10 lbs. until further directed by your surgeon. Avoid periods of inactivity such as sitting longer than an hour when not asleep. This helps prevent blood clots.  You may return to work once you are authorized by your doctor.  WEIGHT BEARING   Weight bearing as tolerated with assist device (walker, cane, etc) as directed, use it as long as suggested by your surgeon or therapist, typically at least 4-6 weeks.   EXERCISES  Results after joint replacement surgery are often greatly improved when you follow the exercise, range of motion and muscle strengthening exercises prescribed by your doctor. Safety measures are also important to protect the joint from further injury. Any time any of these exercises cause you to have increased pain or swelling, decrease what you are doing until you are comfortable again and then slowly increase them. If you have problems or questions, call your caregiver or physical therapist for advice.   Rehabilitation is important following a joint replacement. After just a few days of immobilization, the muscles of the leg can become weakened and shrink (atrophy).  These exercises are designed to build up the tone and strength of the thigh and leg muscles and to improve motion. Often times heat used for twenty to thirty minutes before working out will loosen up your tissues and help with improving the range of motion but do not use heat for the first two weeks following surgery (sometimes heat can increase post-operative swelling).   These exercises can be done on a training (exercise) mat, on the floor, on a table or on a bed. Use whatever works the best and is most comfortable for you.    Use music or television while you are exercising so that the exercises  are a pleasant break in your day. This will make your life better with the exercises acting as a break in your routine that you can look forward to.   Perform all exercises about fifteen times, three times per day or as directed.  You should exercise both the operative leg and the other leg as well.  Exercises include:   Quad Sets - Tighten up the muscle on the front of the thigh (Quad) and hold for 5-10 seconds.   Straight Leg Raises - With your knee straight (if you were given a brace, keep it on), lift the leg to 60 degrees, hold for 3 seconds, and slowly lower the leg.  Perform this exercise against resistance later as your leg gets stronger.  Leg Slides: Lying on your back, slowly slide your foot toward your buttocks, bending your knee up off the floor (only go as far as is comfortable). Then slowly slide your foot back down until your leg is flat on the floor again.  Angel Wings: Lying on your back spread your legs to the side as far apart as you can without causing discomfort.  Hamstring Strength:  Lying on your back, push your heel against the floor with your leg straight by tightening up the muscles of your buttocks.  Repeat, but this time bend your knee to a comfortable angle, and push your heel against the floor.  You may put a pillow under the heel to make it more comfortable if necessary.   A rehabilitation program following joint replacement surgery can speed recovery and prevent re-injury in the future due to weakened muscles. Contact your doctor or a physical therapist for more information on knee rehabilitation.    CONSTIPATION  Constipation is defined medically as fewer than three stools per week and severe constipation as less than one stool per week.  Even if you have a regular bowel pattern at home, your normal regimen is likely to be disrupted due to multiple reasons following surgery.  Combination  of anesthesia, postoperative narcotics, change in appetite and fluid intake all  can affect your bowels.   YOU MUST use at least one of the following options; they are listed in order of increasing strength to get the job done.  They are all available over the counter, and you may need to use some, POSSIBLY even all of these options:    Drink plenty of fluids (prune juice may be helpful) and high fiber foods Colace 100 mg by mouth twice a day  Senokot for constipation as directed and as needed Dulcolax (bisacodyl), take with full glass of water  Miralax (polyethylene glycol) once or twice a day as needed.  If you have tried all these things and are unable to have a bowel movement in the first 3-4 days after surgery call either your surgeon or your primary doctor.    If you experience loose stools or diarrhea, hold the medications until you stool forms back up.  If your symptoms do not get better within 1 week or if they get worse, check with your doctor.  If you experience "the worst abdominal pain ever" or develop nausea or vomiting, please contact the office immediately for further recommendations for treatment.   ITCHING:  If you experience itching with your medications, try taking only a single pain pill, or even half a pain pill at a time.  You can also use Benadryl over the counter for itching or also to help with sleep.   TED HOSE STOCKINGS:  Use stockings on both legs until for at least 2 weeks or as directed by physician office. They may be removed at night for sleeping.  MEDICATIONS:  See your medication summary on the "After Visit Summary" that nursing will review with you.  You may have some home medications which will be placed on hold until you complete the course of blood thinner medication.  It is important for you to complete the blood thinner medication as prescribed.  PRECAUTIONS:  If you experience chest pain or shortness of breath - call 911 immediately for transfer to the hospital emergency department.   If you develop a fever greater that 101 F,  purulent drainage from wound, increased redness or drainage from wound, foul odor from the wound/dressing, or calf pain - CONTACT YOUR SURGEON.                                                   FOLLOW-UP APPOINTMENTS:  If you do not already have a post-op appointment, please call the office for an appointment to be seen by your surgeon.  Guidelines for how soon to be seen are listed in your "After Visit Summary", but are typically between 1-4 weeks after surgery.  OTHER INSTRUCTIONS:   Knee Replacement:  Do not place pillow under knee, focus on keeping the knee straight while resting. CPM instructions: 0-90 degrees, 2 hours in the morning, 2 hours in the afternoon, and 2 hours in the evening. Place foam block, curve side up under heel at all times except when in CPM or when walking.  DO NOT modify, tear, cut, or change the foam block in any way.  POST-OPERATIVE OPIOID TAPER INSTRUCTIONS: It is important to wean off of your opioid medication as soon as possible. If you do not need pain medication after your surgery it is ok to stop  day one. Opioids include: Codeine, Hydrocodone(Norco, Vicodin), Oxycodone(Percocet, oxycontin) and hydromorphone amongst others.  Long term and even short term use of opiods can cause: Increased pain response Dependence Constipation Depression Respiratory depression And more.  Withdrawal symptoms can include Flu like symptoms Nausea, vomiting And more Techniques to manage these symptoms Hydrate well Eat regular healthy meals Stay active Use relaxation techniques(deep breathing, meditating, yoga) Do Not substitute Alcohol to help with tapering If you have been on opioids for less than two weeks and do not have pain than it is ok to stop all together.  Plan to wean off of opioids This plan should start within one week post op of your joint replacement. Maintain the same interval or time between taking each dose and first decrease the dose.  Cut the total  daily intake of opioids by one tablet each day Next start to increase the time between doses. The last dose that should be eliminated is the evening dose.   MAKE SURE YOU:  Understand these instructions.  Get help right away if you are not doing well or get worse.    Thank you for letting us be a part of your medical care team.  It is a privilege we respect greatly.  We hope these instructions will help you stay on track for a fast and full recovery!    Dental Antibiotics:  In most cases prophylactic antibiotics for Dental procdeures after total joint surgery are not necessary.  Exceptions are as follows:  1. History of prior total joint infection  2. Severely immunocompromised (Organ Transplant, cancer chemotherapy, Rheumatoid biologic meds such as Humera)  3. Poorly controlled diabetes (A1C &gt; 8.0, blood glucose over 200)  If you have one of these conditions, contact your surgeon for an antibiotic prescription, prior to your dental procedure.   Increase activity slowly as tolerated   Complete by: As directed    Post-operative opioid taper instructions:   Complete by: As directed    POST-OPERATIVE OPIOID TAPER INSTRUCTIONS: It is important to wean off of your opioid medication as soon as possible. If you do not need pain medication after your surgery it is ok to stop day one. Opioids include: Codeine, Hydrocodone(Norco, Vicodin), Oxycodone(Percocet, oxycontin) and hydromorphone amongst others.  Long term and even short term use of opiods can cause: Increased pain response Dependence Constipation Depression Respiratory depression And more.  Withdrawal symptoms can include Flu like symptoms Nausea, vomiting And more Techniques to manage these symptoms Hydrate well Eat regular healthy meals Stay active Use relaxation techniques(deep breathing, meditating, yoga) Do Not substitute Alcohol to help with tapering If you have been on opioids for less than two weeks and  do not have pain than it is ok to stop all together.  Plan to wean off of opioids This plan should start within one week post op of your joint replacement. Maintain the same interval or time between taking each dose and first decrease the dose.  Cut the total daily intake of opioids by one tablet each day Next start to increase the time between doses. The last dose that should be eliminated is the evening dose.           Follow-up Information     Joaquim Nam, MD Follow up.   Specialty: Family Medicine Contact information: [address] [place] [address] [phone]         [facility] Follow up.   Why: home health PT services will be  provided by [facility], start of care within 48 hours post discharge Contact information: [address] [place] [address] [phone]                  Signed: Karenann Cai 01/28/2023, 9:20 AM

## 2023-01-29 ENCOUNTER — Ambulatory Visit (INDEPENDENT_AMBULATORY_CARE_PROVIDER_SITE_OTHER): Payer: Medicare Other | Admitting: Family Medicine

## 2023-01-29 ENCOUNTER — Encounter: Payer: Self-pay | Admitting: Family Medicine

## 2023-01-29 VITALS — BP 112/60 | HR 99 | Temp 97.4°F | Ht 73.0 in | Wt 260.0 lb

## 2023-01-29 DIAGNOSIS — D649 Anemia, unspecified: Secondary | ICD-10-CM

## 2023-01-29 DIAGNOSIS — E039 Hypothyroidism, unspecified: Secondary | ICD-10-CM

## 2023-01-29 MED ORDER — LEVOTHYROXINE SODIUM 125 MCG PO TABS: 125.0000 ug | ORAL_TABLET | Freq: Every day | ORAL | Status: DC

## 2023-01-29 NOTE — Progress Notes (Unsigned)
He was admitted to the hospitalist service with a concern for sepsis possibly due to left knee septic joint status post recent total knee replacement.  Patient was started on broad-spectrum antibiotics, IV hydration, orthopedic consulted.  Patient's symptoms improved, kidney function improved, lactic acid trended down.  Procalcitonin normal.  Orthopedic surgery examined the patient recommended low suspicion for joint infection.  Patient's orthostatic vitals negative.  Telemetry unremarkable.   ========================= Discussed recent events and inpatient course.  He was at home and he was sweating, dizzy, lightheaded, then to ER.  No LOC.  Admitted for presumed sepsis and he improved in the meantime.  Here for follow-up today.  He isn't taking pain meds.  Walking with walker.  Knee is better daily.   Mobility is improving.  Not lightheaded on standing.    Discussed potential multifactorial condition, including but not limited to surgery, anesthesia, pain, pain medication, relative dehydration.  Postop anemia discussed.  Meds, vitals, and allergies reviewed.   ROS: Per HPI unless specifically indicated in ROS section   GEN: nad, alert and oriented HEENT: ncat NECK: supple w/o LA CV: rrr PULM: ctab, no inc wob ABD: soft, +bs EXT: no edema SKIN: no acute rash L knee doesn't feel hot.  Anterior bandage.  No spreading redness.  L knee puffy.  Walking with walker, able to bear weight.  He does have some range of motion at the left knee, not full range of motion, but the knee is not stiff.

## 2023-01-29 NOTE — Patient Instructions (Addendum)
Don't change your meds for now.  Go to the lab on the way out.   If you have mychart we'll likely use that to update you.    Drink enough water to keep your urine clear.  Take care.  Glad to see you.

## 2023-01-30 ENCOUNTER — Other Ambulatory Visit (INDEPENDENT_AMBULATORY_CARE_PROVIDER_SITE_OTHER): Payer: Medicare Other

## 2023-01-30 ENCOUNTER — Ambulatory Visit (INDEPENDENT_AMBULATORY_CARE_PROVIDER_SITE_OTHER): Payer: Medicare Other | Admitting: Surgical

## 2023-01-30 ENCOUNTER — Encounter: Payer: Self-pay | Admitting: Surgical

## 2023-01-30 ENCOUNTER — Telehealth: Payer: Self-pay

## 2023-01-30 DIAGNOSIS — I110 Hypertensive heart disease with heart failure: Secondary | ICD-10-CM | POA: Diagnosis not present

## 2023-01-30 DIAGNOSIS — I5032 Chronic diastolic (congestive) heart failure: Secondary | ICD-10-CM | POA: Diagnosis not present

## 2023-01-30 DIAGNOSIS — I700 Atherosclerosis of aorta: Secondary | ICD-10-CM | POA: Diagnosis not present

## 2023-01-30 DIAGNOSIS — I4819 Other persistent atrial fibrillation: Secondary | ICD-10-CM | POA: Diagnosis not present

## 2023-01-30 DIAGNOSIS — G4733 Obstructive sleep apnea (adult) (pediatric): Secondary | ICD-10-CM | POA: Diagnosis not present

## 2023-01-30 DIAGNOSIS — N401 Enlarged prostate with lower urinary tract symptoms: Secondary | ICD-10-CM | POA: Diagnosis not present

## 2023-01-30 DIAGNOSIS — Z96652 Presence of left artificial knee joint: Secondary | ICD-10-CM

## 2023-01-30 DIAGNOSIS — R351 Nocturia: Secondary | ICD-10-CM | POA: Diagnosis not present

## 2023-01-30 DIAGNOSIS — K449 Diaphragmatic hernia without obstruction or gangrene: Secondary | ICD-10-CM | POA: Diagnosis not present

## 2023-01-30 DIAGNOSIS — D649 Anemia, unspecified: Secondary | ICD-10-CM | POA: Insufficient documentation

## 2023-01-30 DIAGNOSIS — E039 Hypothyroidism, unspecified: Secondary | ICD-10-CM | POA: Diagnosis not present

## 2023-01-30 DIAGNOSIS — I080 Rheumatic disorders of both mitral and aortic valves: Secondary | ICD-10-CM | POA: Diagnosis not present

## 2023-01-30 DIAGNOSIS — I25110 Atherosclerotic heart disease of native coronary artery with unstable angina pectoris: Secondary | ICD-10-CM | POA: Diagnosis not present

## 2023-01-30 DIAGNOSIS — Z471 Aftercare following joint replacement surgery: Secondary | ICD-10-CM | POA: Diagnosis not present

## 2023-01-30 DIAGNOSIS — E785 Hyperlipidemia, unspecified: Secondary | ICD-10-CM | POA: Diagnosis not present

## 2023-01-30 DIAGNOSIS — I7121 Aneurysm of the ascending aorta, without rupture: Secondary | ICD-10-CM | POA: Diagnosis not present

## 2023-01-30 DIAGNOSIS — M7918 Myalgia, other site: Secondary | ICD-10-CM | POA: Diagnosis not present

## 2023-01-30 LAB — CBC WITH DIFFERENTIAL/PLATELET
Basophils Absolute: 0.2 10*3/uL — ABNORMAL HIGH (ref 0.0–0.1)
Basophils Relative: 1.5 % (ref 0.0–3.0)
Eosinophils Absolute: 0.2 10*3/uL (ref 0.0–0.7)
Eosinophils Relative: 1.8 % (ref 0.0–5.0)
HCT: 31.6 % — ABNORMAL LOW (ref 39.0–52.0)
Hemoglobin: 10.5 g/dL — ABNORMAL LOW (ref 13.0–17.0)
Lymphocytes Relative: 28.8 % (ref 12.0–46.0)
Lymphs Abs: 3 10*3/uL (ref 0.7–4.0)
MCHC: 33 g/dL (ref 30.0–36.0)
MCV: 94.2 fl (ref 78.0–100.0)
Monocytes Absolute: 0.6 10*3/uL (ref 0.1–1.0)
Monocytes Relative: 5.9 % (ref 3.0–12.0)
Neutro Abs: 6.5 10*3/uL (ref 1.4–7.7)
Neutrophils Relative %: 62 % (ref 43.0–77.0)
Platelets: 286 10*3/uL (ref 150.0–400.0)
RBC: 3.36 Mil/uL — ABNORMAL LOW (ref 4.22–5.81)
RDW: 14.7 % (ref 11.5–15.5)
WBC: 10.5 10*3/uL (ref 4.0–10.5)

## 2023-01-30 LAB — URINALYSIS, ROUTINE W REFLEX MICROSCOPIC
Bilirubin Urine: NEGATIVE
Hgb urine dipstick: NEGATIVE
Leukocytes,Ua: NEGATIVE
Nitrite: NEGATIVE
RBC / HPF: NONE SEEN (ref 0–?)
Specific Gravity, Urine: 1.015 (ref 1.000–1.030)
Urine Glucose: NEGATIVE
Urobilinogen, UA: 2 — AB (ref 0.0–1.0)
pH: 6.5 (ref 5.0–8.0)

## 2023-01-30 LAB — COMPREHENSIVE METABOLIC PANEL
ALT: 24 U/L (ref 0–53)
AST: 29 U/L (ref 0–37)
Albumin: 3.4 g/dL — ABNORMAL LOW (ref 3.5–5.2)
Alkaline Phosphatase: 69 U/L (ref 39–117)
BUN: 19 mg/dL (ref 6–23)
CO2: 26 mEq/L (ref 19–32)
Calcium: 9.7 mg/dL (ref 8.4–10.5)
Chloride: 103 mEq/L (ref 96–112)
Creatinine, Ser: 1.33 mg/dL (ref 0.40–1.50)
GFR: 52.28 mL/min — ABNORMAL LOW (ref >60.00)
Glucose, Bld: 89 mg/dL (ref 70–99)
Potassium: 4.3 mEq/L (ref 3.5–5.1)
Sodium: 138 mEq/L (ref 135–145)
Total Bilirubin: 1.3 mg/dL — ABNORMAL HIGH (ref 0.2–1.2)
Total Protein: 6.3 g/dL (ref 6.0–8.3)

## 2023-01-30 NOTE — Assessment & Plan Note (Signed)
He has expected postop anemia. Discussed potential multifactorial condition, including but not limited to surgery, anesthesia, pain, pain medication, relative dehydration.  He has improved in the meantime.  No signs of infection/infected joint at this point.  He is taking fluids well at this point and is off of pain medication.  See notes on labs. 30 minutes were devoted to patient care in this encounter (this includes time spent reviewing the patient's file/history, interviewing and examining the patient, counseling/reviewing plan with patient).

## 2023-01-30 NOTE — Telephone Encounter (Signed)
Franky Macho said patient was asking if could extend HHPT? Sending to you to advise since patient is bundle.

## 2023-01-30 NOTE — Progress Notes (Signed)
Post-Op Visit Note   Patient: James [name]           Date of Birth: 1946-07-08           MRN: [id number] Visit Date: 01/30/2023 PCP: Joaquim Nam, MD   Assessment & Plan:  Chief Complaint:  Chief Complaint  Patient presents with   Left Knee - Routine Post Op    L TKA (surgery date 01-17-23)   Visit Diagnoses:  1. S/P total knee arthroplasty, left     Plan: James [name] is a 77 y.o. male who presents s/p left total knee arthroplasty on 01/17/23.  Doing well overall.  Using CPM machine up to 100 degrees.  They deny any calf pain, shortness of breath, chest pain, abdominal pain.  Pain is overall controlled and taking tylenol for pain control.  Taking eliquis for DVT prophylaxis.  Ambulating with walker. Minimal pain, just feels stiffness with immobility but this is improving. Has had 4 sessions with HHPT.   On exam, patient has range of motion 3 degrees extension to about 90 degrees of knee flexion.  Incision is healing well without evidence of infection or dehiscence.  2+ DP pulse of the operative extremity.  No calf tenderness, negative Homans' sign.  Able to perform straight leg raise.  Intact ankle dorsiflexion.  Plan is continue with HHPT and then transition to outpatient PT to focus on ROM and quad strengthening. 4 week return for recheck with Dr August Saucer.    Follow-Up Instructions: No follow-ups on file.   Orders:  Orders Placed This Encounter  Procedures   XR Knee 1-2 Views Left   No orders of the defined types were placed in this encounter.   Imaging: No results found.  PMFS History: Patient Active Problem List   Diagnosis Date Noted   Dehydration 01/21/2023   Near syncope 01/20/2023   AKI (acute kidney injury) (HCC) 01/20/2023   Septic joint of left knee joint (HCC) 01/20/2023   Elevated lactic acid level 01/20/2023   Severe sepsis (HCC) 01/20/2023   OA (osteoarthritis) of knee 01/17/2023   S/P total knee replacement, left 01/17/2023   Pruritus  03/21/2022   Afib (HCC) 11/07/2021   Hyperglycemia 08/27/2021   Dysphagia 08/27/2021   Thoracic aortic aneurysm without rupture (HCC) 11/25/2019   Chronic heart failure with preserved ejection fraction (HFpEF) (HCC) 08/18/2018   Hyperlipidemia LDL goal <70 08/18/2018   Coronary artery disease involving native coronary artery of native heart without angina pectoris 05/16/2018   Unstable angina (HCC)    CHF (congestive heart failure) (HCC) 05/09/2018   Benign prostatic hyperplasia with nocturia 05/07/2018   Dyspnea on exertion 02/19/2018   Persistent atrial fibrillation (HCC) 02/12/2018   Cardiac murmur 02/12/2018   Pain and swelling of left knee 08/07/2017   Healthcare maintenance 05/01/2016   Hematuria 03/06/2016   Hx of adenomatous colonic polyps 12/05/2015   Advance care planning 07/21/2014   Medicare annual wellness visit, subsequent 07/01/2012   External hemorrhoids 06/27/2011   Hypothyroidism 02/24/2008   HYPERCHOLESTEROLEMIA 02/21/2007   Essential hypertension 02/21/2007   DEGENERATIVE JOINT DISEASE, CERVICAL SPINE 02/21/2007   Past Medical History:  Diagnosis Date   (HFpEF) heart failure with preserved ejection fraction (HCC)    a. 05/2018 Echo: EF 55-60%, gr2 DD.   Acute lower GI bleeding after colonoscopy and polypectomy, resolved 12/12/2015   Ascending aortic aneurysm (HCC)    a. 02/2018 Echo: mildly dil Ao root/asc ao/arch; b. 05/2018 CTA Chest: 4.7cm fusiform aneurysm of Asc  Ao.   CAD (coronary artery disease)    a. 05/2018 Cath/PCI: LM nl,LAD 30p, 90/99d/apical, LCX 64m, OM1/2/3 min irregs, RCA 85p (3.5x18 [place] DES).   Cardiac murmur    a. 02/2018 Echo: EF 60-65%, no rwma, mild AI, mildly dil Ao root/Asc Ao/Arch, Mild MR, mildly dil LA. Nl RV fxn.   Cataract    bilateral surgery to remove   CHF (congestive heart failure) (HCC)    Colon polyp    Constipation    Dysrhythmia    Hx A. Fib   Essential hypertension    Hemorrhoids    Hepatitis    self resolved,  likely food exposure, 1974.    History of cardiovascular stress test    a. 02/2018 Myoview: EF 55-65%, small/mild apical defect w/ nl wall motion ->attenuation artifact. No ischemia. Low risk study.   Hx of adenomatous colonic polyps 12/05/2015   Hyperlipidemia    Hypothyroidism    Mitral regurgitation    a. 110/2019 Echo: EF 55-60%. Gr2 DD. Triv AI. Ao root 38mm. Mild MR. Mod dil LA, mildly dil RA.   Osteoarthritis    Persistent atrial fibrillation (HCC)    a. Dx 02/2018. CHA2DS2VASc = 2-->Eliquis; b. 03/2018 DCCV (200J); c. 03/2018 recurrent Afib-->flecainide started 04/2018;  d. 05/06/2018 s/p successful DCCV - 200J x 2-->on amio.   Skin cancer    basal cell, R ear, MOHS    Family History  Problem Relation Age of Onset   Stroke Mother    Lung cancer Maternal Grandfather        smoker   Stroke Maternal Grandfather    Heart disease Paternal Grandfather        MI, old age   Colon cancer Neg Hx    Colon polyps Neg Hx    Esophageal cancer Neg Hx    Rectal cancer Neg Hx    Stomach cancer Neg Hx    Bladder Cancer Neg Hx    Prostate cancer Neg Hx     Past Surgical History:  Procedure Laterality Date   BROW LIFT Bilateral 10/23/2016   Procedure: BLEPHAROPLASTY upper eyelid with excess skin;  Surgeon: Imagene Riches, MD;  Location: [facility];  Service: Ophthalmology;  Laterality: Bilateral;  MAC   CARDIOVERSION N/A 03/18/2018   Procedure: CARDIOVERSION;  Surgeon: Yvonne Kendall, MD;  Location: [facility] OR[facility];  Service: Cardiovascular;  Laterality: N/A;   CARDIOVERSION N/A 05/06/2018   Procedure: CARDIOVERSION;  Surgeon: Yvonne Kendall, MD;  Location: [facility];  Service: Cardiovascular;  Laterality: N/A;   CATARACT EXTRACTION  1986   OD   CATARACT EXTRACTION Bilateral 1995   x 2 for right and left    COLONOSCOPY     CORONARY STENT INTERVENTION N/A 05/13/2018   Procedure: CORONARY STENT INTERVENTION;  Surgeon: Yvonne Kendall, MD;  Location: [facility];  Service:  Cardiovascular;  Laterality: N/A;   ECTROPION REPAIR Bilateral 10/23/2016   Procedure: REPAIR OF ECTROPION sutures, extensive;  Surgeon: Imagene Riches, MD;  Location: [facility];  Service: Ophthalmology;  Laterality: Bilateral;   LIPOMA EXCISION  06/1997   left neck (Juengel)   MOHS SURGERY Right 2013   behind right ear   RIGHT/LEFT HEART CATH AND CORONARY ANGIOGRAPHY N/A 05/12/2018   Procedure: RIGHT/LEFT HEART CATH AND CORONARY ANGIOGRAPHY;  Surgeon: Iran Ouch, MD;  Location: [facility];  Service: Cardiovascular;  Laterality: N/A;   TONSILLECTOMY     TOTAL KNEE ARTHROPLASTY Left 01/17/2023   Procedure: LEFT TOTAL KNEE  ARTHROPLASTY;  Surgeon: Cammy Copa, MD;  Location: [facility] OR;  Service: Orthopedics;  Laterality: Left;   VASECTOMY  1982   Social History   Occupational History   Occupation: Retired    Associate Professor: RETIRED    Comment: 1  Tobacco Use   Smoking status: Former    Packs/day: 2.00    Years: 20.00    Additional pack years: 0.00    Total pack years: 40.00    Types: Cigarettes    Quit date: 08/07/1983    Years since quitting: 39.5    Passive exposure: Past   Smokeless tobacco: Never   Tobacco comments:    Former smoker 11/17/21 quit over 40 years ago  Vaping Use   Vaping Use: Never used  Substance and Sexual Activity   Alcohol use: Yes    Comment: 1-2 per day   Drug use: No   Sexual activity: Yes    Partners: Female

## 2023-01-31 ENCOUNTER — Other Ambulatory Visit: Payer: Self-pay

## 2023-01-31 DIAGNOSIS — Z96652 Presence of left artificial knee joint: Secondary | ICD-10-CM

## 2023-01-31 NOTE — Telephone Encounter (Signed)
Referral placed for outpatient

## 2023-02-01 ENCOUNTER — Encounter: Payer: Medicare Other | Admitting: Surgical

## 2023-02-01 DIAGNOSIS — Z471 Aftercare following joint replacement surgery: Secondary | ICD-10-CM | POA: Diagnosis not present

## 2023-02-01 DIAGNOSIS — G4733 Obstructive sleep apnea (adult) (pediatric): Secondary | ICD-10-CM | POA: Diagnosis not present

## 2023-02-01 DIAGNOSIS — R351 Nocturia: Secondary | ICD-10-CM | POA: Diagnosis not present

## 2023-02-01 DIAGNOSIS — I4819 Other persistent atrial fibrillation: Secondary | ICD-10-CM | POA: Diagnosis not present

## 2023-02-01 DIAGNOSIS — M7918 Myalgia, other site: Secondary | ICD-10-CM | POA: Diagnosis not present

## 2023-02-01 DIAGNOSIS — K449 Diaphragmatic hernia without obstruction or gangrene: Secondary | ICD-10-CM | POA: Diagnosis not present

## 2023-02-01 DIAGNOSIS — I700 Atherosclerosis of aorta: Secondary | ICD-10-CM | POA: Diagnosis not present

## 2023-02-01 DIAGNOSIS — I25110 Atherosclerotic heart disease of native coronary artery with unstable angina pectoris: Secondary | ICD-10-CM | POA: Diagnosis not present

## 2023-02-01 DIAGNOSIS — I080 Rheumatic disorders of both mitral and aortic valves: Secondary | ICD-10-CM | POA: Diagnosis not present

## 2023-02-01 DIAGNOSIS — I110 Hypertensive heart disease with heart failure: Secondary | ICD-10-CM | POA: Diagnosis not present

## 2023-02-01 DIAGNOSIS — E039 Hypothyroidism, unspecified: Secondary | ICD-10-CM | POA: Diagnosis not present

## 2023-02-01 DIAGNOSIS — E785 Hyperlipidemia, unspecified: Secondary | ICD-10-CM | POA: Diagnosis not present

## 2023-02-01 DIAGNOSIS — N401 Enlarged prostate with lower urinary tract symptoms: Secondary | ICD-10-CM | POA: Diagnosis not present

## 2023-02-01 DIAGNOSIS — Z96652 Presence of left artificial knee joint: Secondary | ICD-10-CM | POA: Diagnosis not present

## 2023-02-01 DIAGNOSIS — I7121 Aneurysm of the ascending aorta, without rupture: Secondary | ICD-10-CM | POA: Diagnosis not present

## 2023-02-01 DIAGNOSIS — I5032 Chronic diastolic (congestive) heart failure: Secondary | ICD-10-CM | POA: Diagnosis not present

## 2023-02-03 DIAGNOSIS — M1712 Unilateral primary osteoarthritis, left knee: Secondary | ICD-10-CM

## 2023-02-05 ENCOUNTER — Telehealth: Payer: Self-pay | Admitting: Orthopedic Surgery

## 2023-02-05 DIAGNOSIS — R351 Nocturia: Secondary | ICD-10-CM | POA: Diagnosis not present

## 2023-02-05 DIAGNOSIS — I25110 Atherosclerotic heart disease of native coronary artery with unstable angina pectoris: Secondary | ICD-10-CM | POA: Diagnosis not present

## 2023-02-05 DIAGNOSIS — M7918 Myalgia, other site: Secondary | ICD-10-CM | POA: Diagnosis not present

## 2023-02-05 DIAGNOSIS — I5032 Chronic diastolic (congestive) heart failure: Secondary | ICD-10-CM | POA: Diagnosis not present

## 2023-02-05 DIAGNOSIS — I7121 Aneurysm of the ascending aorta, without rupture: Secondary | ICD-10-CM | POA: Diagnosis not present

## 2023-02-05 DIAGNOSIS — E785 Hyperlipidemia, unspecified: Secondary | ICD-10-CM | POA: Diagnosis not present

## 2023-02-05 DIAGNOSIS — N401 Enlarged prostate with lower urinary tract symptoms: Secondary | ICD-10-CM | POA: Diagnosis not present

## 2023-02-05 DIAGNOSIS — Z471 Aftercare following joint replacement surgery: Secondary | ICD-10-CM | POA: Diagnosis not present

## 2023-02-05 DIAGNOSIS — I700 Atherosclerosis of aorta: Secondary | ICD-10-CM | POA: Diagnosis not present

## 2023-02-05 DIAGNOSIS — G4733 Obstructive sleep apnea (adult) (pediatric): Secondary | ICD-10-CM | POA: Diagnosis not present

## 2023-02-05 DIAGNOSIS — K449 Diaphragmatic hernia without obstruction or gangrene: Secondary | ICD-10-CM | POA: Diagnosis not present

## 2023-02-05 DIAGNOSIS — I080 Rheumatic disorders of both mitral and aortic valves: Secondary | ICD-10-CM | POA: Diagnosis not present

## 2023-02-05 DIAGNOSIS — I110 Hypertensive heart disease with heart failure: Secondary | ICD-10-CM | POA: Diagnosis not present

## 2023-02-05 DIAGNOSIS — Z96652 Presence of left artificial knee joint: Secondary | ICD-10-CM | POA: Diagnosis not present

## 2023-02-05 DIAGNOSIS — I4819 Other persistent atrial fibrillation: Secondary | ICD-10-CM | POA: Diagnosis not present

## 2023-02-05 DIAGNOSIS — E039 Hypothyroidism, unspecified: Secondary | ICD-10-CM | POA: Diagnosis not present

## 2023-02-05 NOTE — Telephone Encounter (Signed)
Pt would like to do his outpatient PT in [place] needs to start after 07/16 that is when his HHPT is going to be over

## 2023-02-06 DIAGNOSIS — E785 Hyperlipidemia, unspecified: Secondary | ICD-10-CM | POA: Diagnosis not present

## 2023-02-06 DIAGNOSIS — E669 Obesity, unspecified: Secondary | ICD-10-CM | POA: Diagnosis not present

## 2023-02-06 DIAGNOSIS — K5900 Constipation, unspecified: Secondary | ICD-10-CM | POA: Diagnosis not present

## 2023-02-06 DIAGNOSIS — Z87891 Personal history of nicotine dependence: Secondary | ICD-10-CM | POA: Diagnosis not present

## 2023-02-06 DIAGNOSIS — Z85828 Personal history of other malignant neoplasm of skin: Secondary | ICD-10-CM | POA: Diagnosis not present

## 2023-02-06 DIAGNOSIS — M1990 Unspecified osteoarthritis, unspecified site: Secondary | ICD-10-CM | POA: Diagnosis not present

## 2023-02-06 DIAGNOSIS — Z7901 Long term (current) use of anticoagulants: Secondary | ICD-10-CM | POA: Diagnosis not present

## 2023-02-06 DIAGNOSIS — I10 Essential (primary) hypertension: Secondary | ICD-10-CM | POA: Diagnosis not present

## 2023-02-06 NOTE — Telephone Encounter (Signed)
Lvm advising  

## 2023-02-08 DIAGNOSIS — I080 Rheumatic disorders of both mitral and aortic valves: Secondary | ICD-10-CM | POA: Diagnosis not present

## 2023-02-08 DIAGNOSIS — G4733 Obstructive sleep apnea (adult) (pediatric): Secondary | ICD-10-CM | POA: Diagnosis not present

## 2023-02-08 DIAGNOSIS — I7121 Aneurysm of the ascending aorta, without rupture: Secondary | ICD-10-CM | POA: Diagnosis not present

## 2023-02-08 DIAGNOSIS — I110 Hypertensive heart disease with heart failure: Secondary | ICD-10-CM | POA: Diagnosis not present

## 2023-02-08 DIAGNOSIS — Z471 Aftercare following joint replacement surgery: Secondary | ICD-10-CM | POA: Diagnosis not present

## 2023-02-08 DIAGNOSIS — I4819 Other persistent atrial fibrillation: Secondary | ICD-10-CM | POA: Diagnosis not present

## 2023-02-08 DIAGNOSIS — E039 Hypothyroidism, unspecified: Secondary | ICD-10-CM | POA: Diagnosis not present

## 2023-02-08 DIAGNOSIS — Z96652 Presence of left artificial knee joint: Secondary | ICD-10-CM | POA: Diagnosis not present

## 2023-02-08 DIAGNOSIS — E785 Hyperlipidemia, unspecified: Secondary | ICD-10-CM | POA: Diagnosis not present

## 2023-02-08 DIAGNOSIS — M7918 Myalgia, other site: Secondary | ICD-10-CM | POA: Diagnosis not present

## 2023-02-08 DIAGNOSIS — R351 Nocturia: Secondary | ICD-10-CM | POA: Diagnosis not present

## 2023-02-08 DIAGNOSIS — I700 Atherosclerosis of aorta: Secondary | ICD-10-CM | POA: Diagnosis not present

## 2023-02-08 DIAGNOSIS — N401 Enlarged prostate with lower urinary tract symptoms: Secondary | ICD-10-CM | POA: Diagnosis not present

## 2023-02-08 DIAGNOSIS — K449 Diaphragmatic hernia without obstruction or gangrene: Secondary | ICD-10-CM | POA: Diagnosis not present

## 2023-02-08 DIAGNOSIS — I25110 Atherosclerotic heart disease of native coronary artery with unstable angina pectoris: Secondary | ICD-10-CM | POA: Diagnosis not present

## 2023-02-08 DIAGNOSIS — I5032 Chronic diastolic (congestive) heart failure: Secondary | ICD-10-CM | POA: Diagnosis not present

## 2023-02-12 DIAGNOSIS — I4819 Other persistent atrial fibrillation: Secondary | ICD-10-CM | POA: Diagnosis not present

## 2023-02-12 DIAGNOSIS — I700 Atherosclerosis of aorta: Secondary | ICD-10-CM | POA: Diagnosis not present

## 2023-02-12 DIAGNOSIS — Z471 Aftercare following joint replacement surgery: Secondary | ICD-10-CM | POA: Diagnosis not present

## 2023-02-12 DIAGNOSIS — K449 Diaphragmatic hernia without obstruction or gangrene: Secondary | ICD-10-CM | POA: Diagnosis not present

## 2023-02-12 DIAGNOSIS — Z96652 Presence of left artificial knee joint: Secondary | ICD-10-CM | POA: Diagnosis not present

## 2023-02-12 DIAGNOSIS — I25110 Atherosclerotic heart disease of native coronary artery with unstable angina pectoris: Secondary | ICD-10-CM | POA: Diagnosis not present

## 2023-02-12 DIAGNOSIS — I7121 Aneurysm of the ascending aorta, without rupture: Secondary | ICD-10-CM | POA: Diagnosis not present

## 2023-02-12 DIAGNOSIS — N401 Enlarged prostate with lower urinary tract symptoms: Secondary | ICD-10-CM | POA: Diagnosis not present

## 2023-02-12 DIAGNOSIS — I110 Hypertensive heart disease with heart failure: Secondary | ICD-10-CM | POA: Diagnosis not present

## 2023-02-12 DIAGNOSIS — I5032 Chronic diastolic (congestive) heart failure: Secondary | ICD-10-CM | POA: Diagnosis not present

## 2023-02-12 DIAGNOSIS — E785 Hyperlipidemia, unspecified: Secondary | ICD-10-CM | POA: Diagnosis not present

## 2023-02-12 DIAGNOSIS — G4733 Obstructive sleep apnea (adult) (pediatric): Secondary | ICD-10-CM | POA: Diagnosis not present

## 2023-02-12 DIAGNOSIS — R351 Nocturia: Secondary | ICD-10-CM | POA: Diagnosis not present

## 2023-02-12 DIAGNOSIS — M7918 Myalgia, other site: Secondary | ICD-10-CM | POA: Diagnosis not present

## 2023-02-12 DIAGNOSIS — I080 Rheumatic disorders of both mitral and aortic valves: Secondary | ICD-10-CM | POA: Diagnosis not present

## 2023-02-12 DIAGNOSIS — E039 Hypothyroidism, unspecified: Secondary | ICD-10-CM | POA: Diagnosis not present

## 2023-02-15 DIAGNOSIS — E785 Hyperlipidemia, unspecified: Secondary | ICD-10-CM | POA: Diagnosis not present

## 2023-02-15 DIAGNOSIS — N401 Enlarged prostate with lower urinary tract symptoms: Secondary | ICD-10-CM | POA: Diagnosis not present

## 2023-02-15 DIAGNOSIS — I110 Hypertensive heart disease with heart failure: Secondary | ICD-10-CM | POA: Diagnosis not present

## 2023-02-15 DIAGNOSIS — I25110 Atherosclerotic heart disease of native coronary artery with unstable angina pectoris: Secondary | ICD-10-CM | POA: Diagnosis not present

## 2023-02-15 DIAGNOSIS — K449 Diaphragmatic hernia without obstruction or gangrene: Secondary | ICD-10-CM | POA: Diagnosis not present

## 2023-02-15 DIAGNOSIS — I4819 Other persistent atrial fibrillation: Secondary | ICD-10-CM | POA: Diagnosis not present

## 2023-02-15 DIAGNOSIS — I7121 Aneurysm of the ascending aorta, without rupture: Secondary | ICD-10-CM | POA: Diagnosis not present

## 2023-02-15 DIAGNOSIS — E039 Hypothyroidism, unspecified: Secondary | ICD-10-CM | POA: Diagnosis not present

## 2023-02-15 DIAGNOSIS — G4733 Obstructive sleep apnea (adult) (pediatric): Secondary | ICD-10-CM | POA: Diagnosis not present

## 2023-02-15 DIAGNOSIS — Z471 Aftercare following joint replacement surgery: Secondary | ICD-10-CM | POA: Diagnosis not present

## 2023-02-15 DIAGNOSIS — M7918 Myalgia, other site: Secondary | ICD-10-CM | POA: Diagnosis not present

## 2023-02-15 DIAGNOSIS — Z96652 Presence of left artificial knee joint: Secondary | ICD-10-CM | POA: Diagnosis not present

## 2023-02-15 DIAGNOSIS — R351 Nocturia: Secondary | ICD-10-CM | POA: Diagnosis not present

## 2023-02-15 DIAGNOSIS — I5032 Chronic diastolic (congestive) heart failure: Secondary | ICD-10-CM | POA: Diagnosis not present

## 2023-02-15 DIAGNOSIS — I080 Rheumatic disorders of both mitral and aortic valves: Secondary | ICD-10-CM | POA: Diagnosis not present

## 2023-02-15 DIAGNOSIS — I700 Atherosclerosis of aorta: Secondary | ICD-10-CM | POA: Diagnosis not present

## 2023-02-18 ENCOUNTER — Telehealth: Payer: Self-pay | Admitting: Physical Therapy

## 2023-02-18 NOTE — Telephone Encounter (Signed)
Called pt to see if he wanted to move his apt up to Tuesday because of increased availability. Pt wants to keep his apt time for Wednesday.

## 2023-02-19 DIAGNOSIS — E039 Hypothyroidism, unspecified: Secondary | ICD-10-CM | POA: Diagnosis not present

## 2023-02-19 DIAGNOSIS — R351 Nocturia: Secondary | ICD-10-CM | POA: Diagnosis not present

## 2023-02-19 DIAGNOSIS — Z96652 Presence of left artificial knee joint: Secondary | ICD-10-CM | POA: Diagnosis not present

## 2023-02-19 DIAGNOSIS — G4733 Obstructive sleep apnea (adult) (pediatric): Secondary | ICD-10-CM | POA: Diagnosis not present

## 2023-02-19 DIAGNOSIS — K449 Diaphragmatic hernia without obstruction or gangrene: Secondary | ICD-10-CM | POA: Diagnosis not present

## 2023-02-19 DIAGNOSIS — E785 Hyperlipidemia, unspecified: Secondary | ICD-10-CM | POA: Diagnosis not present

## 2023-02-19 DIAGNOSIS — I7121 Aneurysm of the ascending aorta, without rupture: Secondary | ICD-10-CM | POA: Diagnosis not present

## 2023-02-19 DIAGNOSIS — I080 Rheumatic disorders of both mitral and aortic valves: Secondary | ICD-10-CM | POA: Diagnosis not present

## 2023-02-19 DIAGNOSIS — I110 Hypertensive heart disease with heart failure: Secondary | ICD-10-CM | POA: Diagnosis not present

## 2023-02-19 DIAGNOSIS — I4819 Other persistent atrial fibrillation: Secondary | ICD-10-CM | POA: Diagnosis not present

## 2023-02-19 DIAGNOSIS — Z471 Aftercare following joint replacement surgery: Secondary | ICD-10-CM | POA: Diagnosis not present

## 2023-02-19 DIAGNOSIS — I25110 Atherosclerotic heart disease of native coronary artery with unstable angina pectoris: Secondary | ICD-10-CM | POA: Diagnosis not present

## 2023-02-19 DIAGNOSIS — N401 Enlarged prostate with lower urinary tract symptoms: Secondary | ICD-10-CM | POA: Diagnosis not present

## 2023-02-19 DIAGNOSIS — I5032 Chronic diastolic (congestive) heart failure: Secondary | ICD-10-CM | POA: Diagnosis not present

## 2023-02-19 DIAGNOSIS — M7918 Myalgia, other site: Secondary | ICD-10-CM | POA: Diagnosis not present

## 2023-02-19 DIAGNOSIS — I700 Atherosclerosis of aorta: Secondary | ICD-10-CM | POA: Diagnosis not present

## 2023-02-20 ENCOUNTER — Ambulatory Visit: Payer: Medicare Other | Attending: Surgical | Admitting: Physical Therapy

## 2023-02-20 DIAGNOSIS — Z96652 Presence of left artificial knee joint: Secondary | ICD-10-CM | POA: Insufficient documentation

## 2023-02-20 DIAGNOSIS — M25562 Pain in left knee: Secondary | ICD-10-CM | POA: Diagnosis not present

## 2023-02-20 NOTE — Therapy (Signed)
OUTPATIENT PHYSICAL THERAPY LOWER EXTREMITY EVALUATION   Patient Name: James [name] MRN: [id number] DOB:03/29/1946, 77 y.o., male Today's Date: 02/20/2023  END OF SESSION:  PT End of Session - 02/20/23 1307     Visit Number 1    Number of Visits 20    Date for PT Re-Evaluation 05/01/23    Authorization Type [id number]    Authorization - Visit Number 1    Authorization - Number of Visits 20    Progress Note Due on Visit 10    PT Start Time 1305    PT Stop Time 1345    PT Time Calculation (min) 40 min    Activity Tolerance Patient tolerated treatment well    Behavior During Therapy WFL for tasks assessed/performed             Past Medical History:  Diagnosis Date   (HFpEF) heart failure with preserved ejection fraction (HCC)    a. 05/2018 Echo: EF 55-60%, gr2 DD.   Acute lower GI bleeding after colonoscopy and polypectomy, resolved 12/12/2015   Ascending aortic aneurysm (HCC)    a. 02/2018 Echo: mildly dil Ao root/asc ao/arch; b. 05/2018 CTA Chest: 4.7cm fusiform aneurysm of Asc Ao.   CAD (coronary artery disease)    a. 05/2018 Cath/PCI: LM nl,LAD 30p, 90/99d/apical, LCX 61m, OM1/2/3 min irregs, RCA 85p (3.5x18 [place] DES).   Cardiac murmur    a. 02/2018 Echo: EF 60-65%, no rwma, mild AI, mildly dil Ao root/Asc Ao/Arch, Mild MR, mildly dil LA. Nl RV fxn.   Cataract    bilateral surgery to remove   CHF (congestive heart failure) (HCC)    Colon polyp    Constipation    Dysrhythmia    Hx A. Fib   Essential hypertension    Hemorrhoids    Hepatitis    self resolved, likely food exposure, 1974.    History of cardiovascular stress test    a. 02/2018 Myoview: EF 55-65%, small/mild apical defect w/ nl wall motion ->attenuation artifact. No ischemia. Low risk study.   Hx of adenomatous colonic polyps 12/05/2015   Hyperlipidemia    Hypothyroidism    Mitral regurgitation    a. 110/2019 Echo: EF 55-60%. Gr2 DD. Triv AI. Ao root 38mm. Mild MR. Mod dil LA, mildly dil RA.    Osteoarthritis    Persistent atrial fibrillation (HCC)    a. Dx 02/2018. CHA2DS2VASc = 2-->Eliquis; b. 03/2018 DCCV (200J); c. 03/2018 recurrent Afib-->flecainide started 04/2018;  d. 05/06/2018 s/p successful DCCV - 200J x 2-->on amio.   Skin cancer    basal cell, R ear, MOHS   Past Surgical History:  Procedure Laterality Date   BROW LIFT Bilateral 10/23/2016   Procedure: BLEPHAROPLASTY upper eyelid with excess skin;  Surgeon: Imagene Riches, MD;  Location: [facility];  Service: Ophthalmology;  Laterality: Bilateral;  MAC   CARDIOVERSION N/A 03/18/2018   Procedure: CARDIOVERSION;  Surgeon: Yvonne Kendall, MD;  Location: [facility];  Service: Cardiovascular;  Laterality: N/A;   CARDIOVERSION N/A 05/06/2018   Procedure: CARDIOVERSION;  Surgeon: Yvonne Kendall, MD;  Location: [facility];  Service: Cardiovascular;  Laterality: N/A;   CATARACT EXTRACTION  1986   OD   CATARACT EXTRACTION Bilateral 1995   x 2 for right and left    COLONOSCOPY     CORONARY STENT INTERVENTION N/A 05/13/2018   Procedure: CORONARY STENT INTERVENTION;  Surgeon: Yvonne Kendall, MD;  Location: [facility];  Service: Cardiovascular;  Laterality: N/A;   ECTROPION REPAIR  Bilateral 10/23/2016   Procedure: REPAIR OF ECTROPION sutures, extensive;  Surgeon: Imagene Riches, MD;  Location: [facility];  Service: Ophthalmology;  Laterality: Bilateral;   LIPOMA EXCISION  06/1997   left neck (Juengel)   MOHS SURGERY Right 2013   behind right ear   RIGHT/LEFT HEART CATH AND CORONARY ANGIOGRAPHY N/A 05/12/2018   Procedure: RIGHT/LEFT HEART CATH AND CORONARY ANGIOGRAPHY;  Surgeon: Iran Ouch, MD;  Location: [facility];  Service: Cardiovascular;  Laterality: N/A;   TONSILLECTOMY     TOTAL KNEE ARTHROPLASTY Left 01/17/2023   Procedure: LEFT TOTAL KNEE ARTHROPLASTY;  Surgeon: Cammy Copa, MD;  Location: [facility];  Service: Orthopedics;  Laterality: Left;   VASECTOMY  1982   Patient Active Problem  List   Diagnosis Date Noted   Arthritis of left knee 02/03/2023   Anemia 01/30/2023   Dehydration 01/21/2023   Near syncope 01/20/2023   AKI (acute kidney injury) (HCC) 01/20/2023   Septic joint of left knee joint (HCC) 01/20/2023   Elevated lactic acid level 01/20/2023   Severe sepsis (HCC) 01/20/2023   OA (osteoarthritis) of knee 01/17/2023   S/P total knee replacement, left 01/17/2023   Pruritus 03/21/2022   Afib (HCC) 11/07/2021   Hyperglycemia 08/27/2021   Dysphagia 08/27/2021   Thoracic aortic aneurysm without rupture (HCC) 11/25/2019   Chronic heart failure with preserved ejection fraction (HFpEF) (HCC) 08/18/2018   Hyperlipidemia LDL goal <70 08/18/2018   Coronary artery disease involving native coronary artery of native heart without angina pectoris 05/16/2018   Unstable angina (HCC)    CHF (congestive heart failure) (HCC) 05/09/2018   Benign prostatic hyperplasia with nocturia 05/07/2018   Dyspnea on exertion 02/19/2018   Persistent atrial fibrillation (HCC) 02/12/2018   Cardiac murmur 02/12/2018   Pain and swelling of left knee 08/07/2017   Healthcare maintenance 05/01/2016   Hematuria 03/06/2016   Hx of adenomatous colonic polyps 12/05/2015   Advance care planning 07/21/2014   Medicare annual wellness visit, subsequent 07/01/2012   External hemorrhoids 06/27/2011   Hypothyroidism 02/24/2008   HYPERCHOLESTEROLEMIA 02/21/2007   Essential hypertension 02/21/2007   DEGENERATIVE JOINT DISEASE, CERVICAL SPINE 02/21/2007    PCP: Dr. Crawford Givens   REFERRING PROVIDER: Harriette Bouillon PA-C  REFERRING DIAG: Left TKA   THERAPY DIAG:  Left knee pain, unspecified chronicity  Rationale for Evaluation and Treatment: Rehabilitation  ONSET DATE: 01/17/23  SUBJECTIVE:   SUBJECTIVE STATEMENT: See pertinent history   PERTINENT HISTORY: Pt reports that he just finished HHPT and he reports that it went well. His pain is well controlled and he is currently only taking  two tylenols at the beginning of the day.   PAIN:  Are you having pain? No  PRECAUTIONS: None  RED FLAGS: None   WEIGHT BEARING RESTRICTIONS: No  FALLS:  Has patient fallen in last 6 months? No  LIVING ENVIRONMENT: Lives with: lives with their spouse Lives in: House/apartment Stairs: No Has following equipment at home: Single point cane, Environmental consultant - 2 wheeled, and shower chair  OCCUPATION: Retired   PLOF: Independent  PATIENT GOALS: Return to walking without a single point cane and hiking   NEXT MD VISIT: 02/27/23  OBJECTIVE:   VITALS: BP 119/75 HR 78 SpO2 100   DIAGNOSTIC FINDINGS: AP and lateral views of the knee reviewed.  Total knee arthroplasty  prosthesis in good position and alignment without any complicating  features.  There is no evidence of dislocation, periprosthetic fracture,  patella baja/alta   PATIENT  SURVEYS:  FOTO 44/100   COGNITION: Overall cognitive status: Within functional limits for tasks assessed     SENSATION: WFL  EDEMA:  Circumferential: R/L 16"/18"  MUSCLE LENGTH: Hamstrings: Right NT deg; Left NT deg Maisie Fus test: Right NT deg; Left NT  deg  POSTURE: rounded shoulders  PALPATION: Anterior surface of left knee   LOWER EXTREMITY ROM:  Active/Passive ROM Right eval Left eval  Hip flexion    Hip extension    Hip abduction    Hip adduction    Hip internal rotation    Hip external rotation    Knee flexion 130/135 115/120  Knee extension 0/-1 4/3*  Ankle dorsiflexion    Ankle plantarflexion    Ankle inversion    Ankle eversion     (Blank rows = not tested)  LOWER EXTREMITY MMT:  MMT Right eval Left eval  Hip flexion    Hip extension    Hip abduction    Hip adduction    Hip internal rotation    Hip external rotation    Knee flexion 4+ 4+  Knee extension 4+ 3-  Ankle dorsiflexion    Ankle plantarflexion    Ankle inversion    Ankle eversion     (Blank rows = not tested)   FUNCTIONAL TESTS:  Squat:  Posterior knee translation with forward trunk lean   GAIT: Distance walked: 50 ft  Assistive device utilized: Single point cane Level of assistance: Modified independence Comments: Left sided antalgic gait    TODAY'S TREATMENT:                                                                                                                              DATE:   02/20/23: Seated knee flexion AAROM 1 x 10 with 3 sec hold  Straight leg raise 1 x 10  -min VC to decrease speed of eccentric portion of exercise  -Pt shows extensor lag Mini-Squat 1 x 10  -mod VC to decrease forward flexion and to maintain knees behind toes  Seated passive knee extension with #5 lb AW in backpack 5 min    PATIENT EDUCATION:  Education details: form and technique for correct performance of exercise  Person educated: Patient Education method: Programmer, multimedia, Demonstration, Verbal cues, and Handouts Education comprehension: verbalized understanding, returned demonstration, and verbal cues required  HOME EXERCISE PROGRAM: Not recorded, but I will ask pt for code next session    ASSESSMENT:  CLINICAL IMPRESSION: Patient is a 77 y.o. white male who was seen today for physical therapy evaluation and treatment s/p 5 weeks for left TKA. He shows decreased left knee ROM and strength along with increase left knee pain that are decreasing his gait stability. He will benefit from skilled PT to address these aforementioned deficits to return to walking and hiking for recreational activity.   OBJECTIVE IMPAIRMENTS: Abnormal gait, difficulty walking, decreased ROM, decreased strength, hypomobility, increased edema, impaired flexibility, obesity, and pain.   ACTIVITY  LIMITATIONS: carrying, lifting, bending, standing, squatting, stairs, and caring for others  PARTICIPATION LIMITATIONS: driving, shopping, community activity, and yard work  PERSONAL FACTORS: 3+ comorbidities: h/o CHF, Afib, HLD, and hypothyroidism  are also  affecting patient's functional outcome.   REHAB POTENTIAL: Good  CLINICAL DECISION MAKING: Stable/uncomplicated  EVALUATION COMPLEXITY: Low   GOALS: Goals reviewed with patient? No  SHORT TERM GOALS: Target date: 03/06/2023  Pt will be independent with HEP in order to improve strength and balance in order to decrease fall risk and improve function at home and work. Baseline: Has not attempted performing at home yet  Goal status: INITIAL  2.  Patient will ambulate without use of an assistive device as a sign of improve left knee function.  Baseline: Using SPC  Goal status: INITIAL    LONG TERM GOALS: Target date: 05/01/2023  Patient will have improved function and activity level as evidenced by an increase in FOTO score by 10 points or more.  Baseline: 44/100 with target of 63  Goal status: INITIAL  2.  Patient will improve left knee AROM to be symmetrical to right knee ROM for improved gait mechanics and gait stability.  Baseline: Knee Ext R/L 0/4 Goal status: INITIAL  3.  Patient will improve left knee strength to by symmetrical to right knee for improved gait mechanics and gait stability  Baseline: Knee Ext R/L 3-/4+, Hip Flex, Ext, Abd NT  Goal status: INITIAL    PLAN:  PT FREQUENCY: 1-2x/week  PT DURATION: 10 weeks  PLANNED INTERVENTIONS: Therapeutic exercises, Neuromuscular re-education, Balance training, Gait training, Patient/Family education, Joint mobilization, Joint manipulation, Stair training, DME instructions, Aquatic Therapy, Dry Needling, Cryotherapy, Moist heat, Manual therapy, and Re-evaluation  PLAN FOR NEXT SESSION: Muscle length testing: Ely's, Efren, and Hamstring, Assess Hip Strength, Terminal knee extension exercise, SAQ, and Long Arc Quads with [place] if need be   Ellin Goodie PT, DPT  [facility] [address], [place], [address] Phone: [phone]   Fax:  [phone]

## 2023-02-25 ENCOUNTER — Ambulatory Visit: Payer: Medicare Other | Admitting: Physical Therapy

## 2023-02-25 DIAGNOSIS — M25562 Pain in left knee: Secondary | ICD-10-CM

## 2023-02-25 DIAGNOSIS — Z96652 Presence of left artificial knee joint: Secondary | ICD-10-CM | POA: Diagnosis not present

## 2023-02-25 NOTE — Therapy (Signed)
OUTPATIENT PHYSICAL THERAPY LOWER EXTREMITY TREATMENT    Patient Name: James [name] MRN: [id number] DOB:03/15/1946, 77 y.o., male Today's Date: 02/25/2023  END OF SESSION:  PT End of Session - 02/25/23 1302     Visit Number 2    Number of Visits 20    Date for PT Re-Evaluation 05/01/23    Authorization Type [id number]    Authorization - Visit Number 2    Authorization - Number of Visits 20    Progress Note Due on Visit 10    PT Start Time 1300    PT Stop Time 1345    PT Time Calculation (min) 45 min    Activity Tolerance Patient tolerated treatment well    Behavior During Therapy WFL for tasks assessed/performed             Past Medical History:  Diagnosis Date   (HFpEF) heart failure with preserved ejection fraction (HCC)    a. 05/2018 Echo: EF 55-60%, gr2 DD.   Acute lower GI bleeding after colonoscopy and polypectomy, resolved 12/12/2015   Ascending aortic aneurysm (HCC)    a. 02/2018 Echo: mildly dil Ao root/asc ao/arch; b. 05/2018 CTA Chest: 4.7cm fusiform aneurysm of Asc Ao.   CAD (coronary artery disease)    a. 05/2018 Cath/PCI: LM nl,LAD 30p, 90/99d/apical, LCX 63m, OM1/2/3 min irregs, RCA 85p (3.5x18 [place] DES).   Cardiac murmur    a. 02/2018 Echo: EF 60-65%, no rwma, mild AI, mildly dil Ao root/Asc Ao/Arch, Mild MR, mildly dil LA. Nl RV fxn.   Cataract    bilateral surgery to remove   CHF (congestive heart failure) (HCC)    Colon polyp    Constipation    Dysrhythmia    Hx A. Fib   Essential hypertension    Hemorrhoids    Hepatitis    self resolved, likely food exposure, 1974.    History of cardiovascular stress test    a. 02/2018 Myoview: EF 55-65%, small/mild apical defect w/ nl wall motion ->attenuation artifact. No ischemia. Low risk study.   Hx of adenomatous colonic polyps 12/05/2015   Hyperlipidemia    Hypothyroidism    Mitral regurgitation    a. 110/2019 Echo: EF 55-60%. Gr2 DD. Triv AI. Ao root 38mm. Mild MR. Mod dil LA, mildly dil RA.    Osteoarthritis    Persistent atrial fibrillation (HCC)    a. Dx 02/2018. CHA2DS2VASc = 2-->Eliquis; b. 03/2018 DCCV (200J); c. 03/2018 recurrent Afib-->flecainide started 04/2018;  d. 05/06/2018 s/p successful DCCV - 200J x 2-->on amio.   Skin cancer    basal cell, R ear, MOHS   Past Surgical History:  Procedure Laterality Date   BROW LIFT Bilateral 10/23/2016   Procedure: BLEPHAROPLASTY upper eyelid with excess skin;  Surgeon: Imagene Riches, MD;  Location: [facility];  Service: Ophthalmology;  Laterality: Bilateral;  MAC   CARDIOVERSION N/A 03/18/2018   Procedure: CARDIOVERSION;  Surgeon: Yvonne Kendall, MD;  Location: [facility];  Service: Cardiovascular;  Laterality: N/A;   CARDIOVERSION N/A 05/06/2018   Procedure: CARDIOVERSION;  Surgeon: Yvonne Kendall, MD;  Location: [facility];  Service: Cardiovascular;  Laterality: N/A;   CATARACT EXTRACTION  1986   OD   CATARACT EXTRACTION Bilateral 1995   x 2 for right and left    COLONOSCOPY     CORONARY STENT INTERVENTION N/A 05/13/2018   Procedure: CORONARY STENT INTERVENTION;  Surgeon: Yvonne Kendall, MD;  Location: [facility];  Service: Cardiovascular;  Laterality: N/A;   ECTROPION  REPAIR Bilateral 10/23/2016   Procedure: REPAIR OF ECTROPION sutures, extensive;  Surgeon: Imagene Riches, MD;  Location: [facility];  Service: Ophthalmology;  Laterality: Bilateral;   LIPOMA EXCISION  06/1997   left neck ([place])   MOHS SURGERY Right 2013   behind right ear   RIGHT/LEFT HEART CATH AND CORONARY ANGIOGRAPHY N/A 05/12/2018   Procedure: RIGHT/LEFT HEART CATH AND CORONARY ANGIOGRAPHY;  Surgeon: Iran Ouch, MD;  Location: [facility];  Service: Cardiovascular;  Laterality: N/A;   TONSILLECTOMY     TOTAL KNEE ARTHROPLASTY Left 01/17/2023   Procedure: LEFT TOTAL KNEE ARTHROPLASTY;  Surgeon: Cammy Copa, MD;  Location: [facility] OR;  Service: Orthopedics;  Laterality: Left;   VASECTOMY  1982   Patient Active Problem  List   Diagnosis Date Noted   Arthritis of left knee 02/03/2023   Anemia 01/30/2023   Dehydration 01/21/2023   Near syncope 01/20/2023   AKI (acute kidney injury) (HCC) 01/20/2023   Septic joint of left knee joint (HCC) 01/20/2023   Elevated lactic acid level 01/20/2023   Severe sepsis (HCC) 01/20/2023   OA (osteoarthritis) of knee 01/17/2023   S/P total knee replacement, left 01/17/2023   Pruritus 03/21/2022   Afib (HCC) 11/07/2021   Hyperglycemia 08/27/2021   Dysphagia 08/27/2021   Thoracic aortic aneurysm without rupture (HCC) 11/25/2019   Chronic heart failure with preserved ejection fraction (HFpEF) (HCC) 08/18/2018   Hyperlipidemia LDL goal <70 08/18/2018   Coronary artery disease involving native coronary artery of native heart without angina pectoris 05/16/2018   Unstable angina (HCC)    CHF (congestive heart failure) (HCC) 05/09/2018   Benign prostatic hyperplasia with nocturia 05/07/2018   Dyspnea on exertion 02/19/2018   Persistent atrial fibrillation (HCC) 02/12/2018   Cardiac murmur 02/12/2018   Pain and swelling of left knee 08/07/2017   Healthcare maintenance 05/01/2016   Hematuria 03/06/2016   Hx of adenomatous colonic polyps 12/05/2015   Advance care planning 07/21/2014   Medicare annual wellness visit, subsequent 07/01/2012   External hemorrhoids 06/27/2011   Hypothyroidism 02/24/2008   HYPERCHOLESTEROLEMIA 02/21/2007   Essential hypertension 02/21/2007   DEGENERATIVE JOINT DISEASE, CERVICAL SPINE 02/21/2007    PCP: Dr. Crawford Givens   REFERRING PROVIDER: Harriette Bouillon PA-C  REFERRING DIAG: Left TKA   THERAPY DIAG:  Left knee pain, unspecified chronicity  Rationale for Evaluation and Treatment: Rehabilitation  ONSET DATE: 01/17/23  SUBJECTIVE:   SUBJECTIVE STATEMENT: Pt reports that he was able to do all exercises with exception of the knee hang which he had to modify to include no weight.   PERTINENT HISTORY: Pt reports that he just  finished HHPT and he reports that it went well. His pain is well controlled and he is currently only taking two tylenols at the beginning of the day.   PAIN:  Are you having pain? No  PRECAUTIONS: None  RED FLAGS: None   WEIGHT BEARING RESTRICTIONS: No  FALLS:  Has patient fallen in last 6 months? No  LIVING ENVIRONMENT: Lives with: lives with their spouse Lives in: House/apartment Stairs: No Has following equipment at home: Single point cane, Environmental consultant - 2 wheeled, and shower chair  OCCUPATION: Retired   PLOF: Independent  PATIENT GOALS: Return to walking without a single point cane and hiking   NEXT MD VISIT: 02/27/23  OBJECTIVE:   VITALS: BP 119/75 HR 78 SpO2 100   DIAGNOSTIC FINDINGS: AP and lateral views of the knee reviewed.  Total knee arthroplasty  prosthesis in good  position and alignment without any complicating  features.  There is no evidence of dislocation, periprosthetic fracture,  patella baja/alta   PATIENT SURVEYS:  FOTO 44/100   COGNITION: Overall cognitive status: Within functional limits for tasks assessed     SENSATION: WFL  EDEMA:  Circumferential: R/L 16"/18"  MUSCLE LENGTH: Hamstrings: Right NT deg; Left NT deg Maisie Fus test: Right NT deg; Left NT  deg  POSTURE: rounded shoulders  PALPATION: Anterior surface of left knee   LOWER EXTREMITY ROM:  Active/Passive ROM Right eval Left eval  Hip flexion    Hip extension    Hip abduction    Hip adduction    Hip internal rotation    Hip external rotation    Knee flexion 130/135 115/120  Knee extension 0/-1 4/3*  Ankle dorsiflexion    Ankle plantarflexion    Ankle inversion    Ankle eversion     (Blank rows = not tested)  LOWER EXTREMITY MMT:  MMT Right eval Left eval  Hip flexion    Hip extension    Hip abduction    Hip adduction    Hip internal rotation    Hip external rotation    Knee flexion 4+ 4+  Knee extension 4+ 3-  Ankle dorsiflexion    Ankle plantarflexion     Ankle inversion    Ankle eversion     (Blank rows = not tested)   FUNCTIONAL TESTS:  Squat: Posterior knee translation with forward trunk lean   GAIT: Distance walked: 50 ft  Assistive device utilized: Single point cane Level of assistance: Modified independence Comments: Left sided antalgic gait    TODAY'S TREATMENT:                                                                                                                              DATE:   02/25/23:          Matrix recumbent bike seat at 16 for 5 min           HS Length: 80/80 deg hip flexion           Ely's Test: Positive Bilaterally           Pone Quad Stretch 2 x 30 sec            Hip MMT:            -Hip Abd R/L 4/4           -Hip Ext R/L 4/4            Sit to Stand with BUE support 3 x 10           -1 UE on seated surface and 1 UE on chair            Partial Lunge with 1 UE support  3 x 10   02/20/23: Seated knee flexion AAROM 1 x 10 with 3 sec hold  Straight leg raise 1  x 10  -min VC to decrease speed of eccentric portion of exercise  -Pt shows extensor lag Mini-Squat 1 x 10  -mod VC to decrease forward flexion and to maintain knees behind toes  Seated passive knee extension with #5 lb AW in backpack 5 min    PATIENT EDUCATION:  Education details: form and technique for correct performance of exercise  Person educated: Patient Education method: Explanation, Demonstration, Verbal cues, and Handouts Education comprehension: verbalized understanding, returned demonstration, and verbal cues required  HOME EXERCISE PROGRAM:  Access Code: [email or web address] URL: [email or web address] Date: 02/25/2023 Prepared by: Ellin Goodie  Exercises - Heel-Toe Walking  - 1 x daily - 10 reps - Prone Quadriceps Stretch with Strap  - 1 x daily - 3 reps - 30-60 sec hold - Sit to Stand  - 3-4 x weekly - 3 sets - 10 reps - Standing Partial Lunge  - 3-4 x weekly - 3 sets - 10 reps - Seated Knee Extension Stretch  with Chair  - 1 x daily - 2-3 reps - 5- 10 min hold   ASSESSMENT:  CLINICAL IMPRESSION: Pt presents s/p 6 weeks left TKA. He continues to show left sided antalgic gait with decreased right step length and left stance time. He was able to tolerate all weight bearing exercises without an increase in his left knee pain. He will continue to benefit from skilled PT to address these aforementioned deficits to return to walking and hiking for recreational activity.     OBJECTIVE IMPAIRMENTS: Abnormal gait, difficulty walking, decreased ROM, decreased strength, hypomobility, increased edema, impaired flexibility, obesity, and pain.   ACTIVITY LIMITATIONS: carrying, lifting, bending, standing, squatting, stairs, and caring for others  PARTICIPATION LIMITATIONS: driving, shopping, community activity, and yard work  PERSONAL FACTORS: 3+ comorbidities: h/o CHF, Afib, HLD, and hypothyroidism  are also affecting patient's functional outcome.   REHAB POTENTIAL: Good  CLINICAL DECISION MAKING: Stable/uncomplicated  EVALUATION COMPLEXITY: Low   GOALS: Goals reviewed with patient? No  SHORT TERM GOALS: Target date: 03/06/2023  Pt will be independent with HEP in order to improve strength and balance in order to decrease fall risk and improve function at home and work. Baseline: Has not attempted performing at home yet  Goal status: ONGOING  2.  Patient will ambulate without use of an assistive device as a sign of improve left knee function.  Baseline: Using SPC  Goal status: ONGOING     LONG TERM GOALS: Target date: 05/01/2023  Patient will have improved function and activity level as evidenced by an increase in FOTO score by 10 points or more.  Baseline: 44/100 with target of 63  Goal status: ONGOING   2.  Patient will improve left knee AROM to be symmetrical to right knee ROM for improved gait mechanics and gait stability.  Baseline: Knee Ext R/L 0/4 Goal status: ONGOING   3.  Patient  will improve left knee strength to by symmetrical to right knee for improved gait mechanics and gait stability  Baseline: Knee Ext R/L 3-/4+, Hip Flex, Ext, Abd NT  Goal status: ONGOING     PLAN:  PT FREQUENCY: 1-2x/week  PT DURATION: 10 weeks  PLANNED INTERVENTIONS: Therapeutic exercises, Neuromuscular re-education, Balance training, Gait training, Patient/Family education, Joint mobilization, Joint manipulation, Stair training, DME instructions, Aquatic Therapy, Dry Needling, Cryotherapy, Moist heat, Manual therapy, and Re-evaluation  PLAN FOR NEXT SESSION:   Terminal knee extension exercise, Long Arc Quads with [place] if need be, and side step up and front step  up   Ellin Goodie PT, DPT  [facility] [address], [place], [address] Phone: [phone]   Fax:  [phone]

## 2023-02-27 ENCOUNTER — Encounter: Payer: Self-pay | Admitting: Orthopedic Surgery

## 2023-02-27 ENCOUNTER — Ambulatory Visit: Payer: Medicare Other | Admitting: Physical Therapy

## 2023-02-27 ENCOUNTER — Ambulatory Visit (INDEPENDENT_AMBULATORY_CARE_PROVIDER_SITE_OTHER): Payer: Medicare Other | Admitting: Orthopedic Surgery

## 2023-02-27 ENCOUNTER — Encounter: Payer: Self-pay | Admitting: Physical Therapy

## 2023-02-27 DIAGNOSIS — Z96652 Presence of left artificial knee joint: Secondary | ICD-10-CM

## 2023-02-27 DIAGNOSIS — M25562 Pain in left knee: Secondary | ICD-10-CM

## 2023-02-27 NOTE — Therapy (Signed)
OUTPATIENT PHYSICAL THERAPY LOWER EXTREMITY TREATMENT    Patient Name: James [name] MRN: [id number] DOB:09/29/1945, 77 y.o., male Today's Date: 02/27/2023  END OF SESSION:  PT End of Session - 02/27/23 0828     Visit Number 3    Number of Visits 20    Date for PT Re-Evaluation 05/01/23    Authorization Type [id number]    Authorization - Visit Number 3    Authorization - Number of Visits 20    Progress Note Due on Visit 10    PT Start Time 0820    PT Stop Time 0900    PT Time Calculation (min) 40 min    Activity Tolerance Patient tolerated treatment well    Behavior During Therapy [facility] for tasks assessed/performed             Past Medical History:  Diagnosis Date   (HFpEF) heart failure with preserved ejection fraction (HCC)    a. 05/2018 Echo: EF 55-60%, gr2 DD.   Acute lower GI bleeding after colonoscopy and polypectomy, resolved 12/12/2015   Ascending aortic aneurysm (HCC)    a. 02/2018 Echo: mildly dil Ao root/asc ao/arch; b. 05/2018 CTA Chest: 4.7cm fusiform aneurysm of Asc Ao.   CAD (coronary artery disease)    a. 05/2018 Cath/PCI: LM nl,LAD 30p, 90/99d/apical, LCX 33m, OM1/2/3 min irregs, RCA 85p (3.5x18 [place] DES).   Cardiac murmur    a. 02/2018 Echo: EF 60-65%, no rwma, mild AI, mildly dil Ao root/Asc Ao/Arch, Mild MR, mildly dil LA. Nl RV fxn.   Cataract    bilateral surgery to remove   CHF (congestive heart failure) (HCC)    Colon polyp    Constipation    Dysrhythmia    Hx A. Fib   Essential hypertension    Hemorrhoids    Hepatitis    self resolved, likely food exposure, 1974.    History of cardiovascular stress test    a. 02/2018 Myoview: EF 55-65%, small/mild apical defect w/ nl wall motion ->attenuation artifact. No ischemia. Low risk study.   Hx of adenomatous colonic polyps 12/05/2015   Hyperlipidemia    Hypothyroidism    Mitral regurgitation    a. 110/2019 Echo: EF 55-60%. Gr2 DD. Triv AI. Ao root 38mm. Mild MR. Mod dil LA, mildly dil RA.    Osteoarthritis    Persistent atrial fibrillation (HCC)    a. Dx 02/2018. CHA2DS2VASc = 2-->Eliquis; b. 03/2018 DCCV (200J); c. 03/2018 recurrent Afib-->flecainide started 04/2018;  d. 05/06/2018 s/p successful DCCV - 200J x 2-->on amio.   Skin cancer    basal cell, R ear, MOHS   Past Surgical History:  Procedure Laterality Date   BROW LIFT Bilateral 10/23/2016   Procedure: BLEPHAROPLASTY upper eyelid with excess skin;  Surgeon: Imagene Riches, MD;  Location: [facility];  Service: Ophthalmology;  Laterality: Bilateral;  MAC   CARDIOVERSION N/A 03/18/2018   Procedure: CARDIOVERSION;  Surgeon: Yvonne Kendall, MD;  Location: [facility] ORS;  Service: Cardiovascular;  Laterality: N/A;   CARDIOVERSION N/A 05/06/2018   Procedure: CARDIOVERSION;  Surgeon: Yvonne Kendall, MD;  Location: [facility] ORS;  Service: Cardiovascular;  Laterality: N/A;   CATARACT EXTRACTION  1986   OD   CATARACT EXTRACTION Bilateral 1995   x 2 for right and left    COLONOSCOPY     CORONARY STENT INTERVENTION N/A 05/13/2018   Procedure: CORONARY STENT INTERVENTION;  Surgeon: Yvonne Kendall, MD;  Location: [facility] INV[facility];  Service: Cardiovascular;  Laterality: N/A;   ECTROPION  REPAIR Bilateral 10/23/2016   Procedure: REPAIR OF ECTROPION sutures, extensive;  Surgeon: Imagene Riches, MD;  Location: [facility];  Service: Ophthalmology;  Laterality: Bilateral;   LIPOMA EXCISION  06/1997   left neck (Juengel)   MOHS SURGERY Right 2013   behind right ear   RIGHT/LEFT HEART CATH AND CORONARY ANGIOGRAPHY N/A 05/12/2018   Procedure: RIGHT/LEFT HEART CATH AND CORONARY ANGIOGRAPHY;  Surgeon: Iran Ouch, MD;  Location: [facility] INVASIVE CV LAB;  Service: Cardiovascular;  Laterality: N/A;   TONSILLECTOMY     TOTAL KNEE ARTHROPLASTY Left 01/17/2023   Procedure: LEFT TOTAL KNEE ARTHROPLASTY;  Surgeon: Cammy Copa, MD;  Location: [facility];  Service: Orthopedics;  Laterality: Left;   VASECTOMY  1982   Patient Active Problem  List   Diagnosis Date Noted   Arthritis of left knee 02/03/2023   Anemia 01/30/2023   Dehydration 01/21/2023   Near syncope 01/20/2023   AKI (acute kidney injury) (HCC) 01/20/2023   Septic joint of left knee joint (HCC) 01/20/2023   Elevated lactic acid level 01/20/2023   Severe sepsis (HCC) 01/20/2023   OA (osteoarthritis) of knee 01/17/2023   S/P total knee replacement, left 01/17/2023   Pruritus 03/21/2022   Afib (HCC) 11/07/2021   Hyperglycemia 08/27/2021   Dysphagia 08/27/2021   Thoracic aortic aneurysm without rupture (HCC) 11/25/2019   Chronic heart failure with preserved ejection fraction (HFpEF) (HCC) 08/18/2018   Hyperlipidemia LDL goal <70 08/18/2018   Coronary artery disease involving native coronary artery of native heart without angina pectoris 05/16/2018   Unstable angina (HCC)    CHF (congestive heart failure) (HCC) 05/09/2018   Benign prostatic hyperplasia with nocturia 05/07/2018   Dyspnea on exertion 02/19/2018   Persistent atrial fibrillation (HCC) 02/12/2018   Cardiac murmur 02/12/2018   Pain and swelling of left knee 08/07/2017   Healthcare maintenance 05/01/2016   Hematuria 03/06/2016   Hx of adenomatous colonic polyps 12/05/2015   Advance care planning 07/21/2014   Medicare annual wellness visit, subsequent 07/01/2012   External hemorrhoids 06/27/2011   Hypothyroidism 02/24/2008   HYPERCHOLESTEROLEMIA 02/21/2007   Essential hypertension 02/21/2007   DEGENERATIVE JOINT DISEASE, CERVICAL SPINE 02/21/2007    PCP: Dr. Crawford Givens   REFERRING PROVIDER: Harriette Bouillon PA-C  REFERRING DIAG: Left TKA   THERAPY DIAG:  Left knee pain, unspecified chronicity  Rationale for Evaluation and Treatment: Rehabilitation  ONSET DATE: 01/17/23  SUBJECTIVE:   SUBJECTIVE STATEMENT: Pt states that he felt increased knee muscle soreness since doing exercises yesterday, but he did not feel joint soreness.   PERTINENT HISTORY: Pt reports that he just finished  HHPT and he reports that it went well. His pain is well controlled and he is currently only taking two tylenols at the beginning of the day.   PAIN:  Are you having pain? No  PRECAUTIONS: None  RED FLAGS: None   WEIGHT BEARING RESTRICTIONS: No  FALLS:  Has patient fallen in last 6 months? No  LIVING ENVIRONMENT: Lives with: lives with their spouse Lives in: House/apartment Stairs: No Has following equipment at home: Single point cane, Environmental consultant - 2 wheeled, and shower chair  OCCUPATION: Retired   PLOF: Independent  PATIENT GOALS: Return to walking without a single point cane and hiking   NEXT MD VISIT: 02/27/23  OBJECTIVE:   VITALS: BP 119/75 HR 78 SpO2 100   DIAGNOSTIC FINDINGS: AP and lateral views of the knee reviewed.  Total knee arthroplasty  prosthesis in good position and alignment without any  complicating  features.  There is no evidence of dislocation, periprosthetic fracture,  patella baja/alta   PATIENT SURVEYS:  FOTO 44/100   COGNITION: Overall cognitive status: Within functional limits for tasks assessed     SENSATION: WFL  EDEMA:  Circumferential: R/L 16"/18"  MUSCLE LENGTH: Hamstrings: Right NT deg; Left NT deg Maisie Fus test: Right NT deg; Left NT  deg  POSTURE: rounded shoulders  PALPATION: Anterior surface of left knee   LOWER EXTREMITY ROM:  Active/Passive ROM Right eval Left eval  Hip flexion    Hip extension    Hip abduction    Hip adduction    Hip internal rotation    Hip external rotation    Knee flexion 130/135 115/120  Knee extension 0/-1 4/3*  Ankle dorsiflexion    Ankle plantarflexion    Ankle inversion    Ankle eversion     (Blank rows = not tested)  LOWER EXTREMITY MMT:  MMT Right eval Left eval  Hip flexion    Hip extension    Hip abduction    Hip adduction    Hip internal rotation    Hip external rotation    Knee flexion 4+ 4+  Knee extension 4+ 3-  Ankle dorsiflexion    Ankle plantarflexion    Ankle  inversion    Ankle eversion     (Blank rows = not tested)   FUNCTIONAL TESTS:  Squat: Posterior knee translation with forward trunk lean   GAIT: Distance walked: 50 ft  Assistive device utilized: Single point cane Level of assistance: Modified independence Comments: Left sided antalgic gait    TODAY'S TREATMENT:                                                                                                                              DATE:   02/27/23: All single leg exercises performed on LLE           Matrix Recumbent seat level 13 for 5 min           OMEGA Leg Press #75 1 x 10           OMEGA Leg Press #95 1 x 10           Standing Hip Abduction with BUE support using mat table 3 x 10          Standing Calf Raises with BUE support 1 x 10           Standing Calf Raises with 1 UE support 1 x 10           Standing Calf Raises with 2 finger support 1 x 10           LAQ 1 x 10           Seated HS Stretch 1 x 30 sec           Long Sitting HS and Calf Stretch 2 x 30 sec  02/25/23:          Matrix recumbent bike seat at 16 for 5 min           HS Length: 80/80 deg hip flexion           Ely's Test: Positive Bilaterally           Pone Quad Stretch 2 x 30 sec            Hip MMT:            -Hip Abd R/L 4/4           -Hip Ext R/L 4/4            Sit to Stand with BUE support 3 x 10           -1 UE on seated surface and 1 UE on chair            Partial Lunge with 1 UE support  3 x 10   02/20/23: Seated knee flexion AAROM 1 x 10 with 3 sec hold  Straight leg raise 1 x 10  -min VC to decrease speed of eccentric portion of exercise  -Pt shows extensor lag Mini-Squat 1 x 10  -mod VC to decrease forward flexion and to maintain knees behind toes  Seated passive knee extension with #5 lb AW in backpack 5 min    PATIENT EDUCATION:  Education details: form and technique for correct performance of exercise  Person educated: Patient Education method: Explanation, Demonstration,  Verbal cues, and Handouts Education comprehension: verbalized understanding, returned demonstration, and verbal cues required  HOME EXERCISE PROGRAM:  Access Code: [email or web address] URL: [email or web address] Date: 02/27/2023 Prepared by: Ellin Goodie  Exercises - Heel-Toe Walking  - 1 x daily - 10 reps - Seated hamstring and calf stretch   - 1 x daily - 3 reps - 60 sec  hold - Prone Quadriceps Stretch with Strap  - 1 x daily - 3 reps - 30-60 sec hold - Sit to Stand  - 3-4 x weekly - 3 sets - 10 reps - Standing Partial Lunge  - 3-4 x weekly - 3 sets - 10 reps - Seated Knee Extension Stretch with Chair  - 1 x daily - 2-3 reps - 5- 10 min hold - Standing Hip Abduction with Counter Support  - 3-4 x weekly - 3 sets - 10 reps   ASSESSMENT:  CLINICAL IMPRESSION: Pt presents s/p 6 weeks left TKA. Pt continues to be able to tolerate all strengthening exercises without an increase in his pain. Despite improvement with knee strength, pt is still struggling to reach terminal knee extension especially in weight bearing. HEP modified to include hamstring stretch to target muscular restrictions. He will continue to benefit from skilled PT to address these aforementioned deficits to return to walking and hiking for recreational activity.    OBJECTIVE IMPAIRMENTS: Abnormal gait, difficulty walking, decreased ROM, decreased strength, hypomobility, increased edema, impaired flexibility, obesity, and pain.   ACTIVITY LIMITATIONS: carrying, lifting, bending, standing, squatting, stairs, and caring for others  PARTICIPATION LIMITATIONS: driving, shopping, community activity, and yard work  PERSONAL FACTORS: 3+ comorbidities: h/o CHF, Afib, HLD, and hypothyroidism  are also affecting patient's functional outcome.   REHAB POTENTIAL: Good  CLINICAL DECISION MAKING: Stable/uncomplicated  EVALUATION COMPLEXITY: Low   GOALS: Goals reviewed with patient? No  SHORT TERM GOALS: Target date:  03/06/2023  Pt will be independent with HEP in order to improve strength and balance in order to decrease  fall risk and improve function at home and work. Baseline: Has not attempted performing at home yet  Goal status: ONGOING  2.  Patient will ambulate without use of an assistive device as a sign of improve left knee function.  Baseline: Using SPC  Goal status: ONGOING     LONG TERM GOALS: Target date: 05/01/2023  Patient will have improved function and activity level as evidenced by an increase in FOTO score by 10 points or more.  Baseline: 44/100 with target of 63  Goal status: ONGOING   2.  Patient will improve left knee AROM to be symmetrical to right knee ROM for improved gait mechanics and gait stability.  Baseline: Knee Ext R/L 0/4 Goal status: ONGOING   3.  Patient will improve left knee strength to by symmetrical to right knee for improved gait mechanics and gait stability  Baseline: Knee Ext R/L 3-/4+, Hip Flex, Ext, Abd NT  Goal status: ONGOING     PLAN:  PT FREQUENCY: 1-2x/week  PT DURATION: 10 weeks  PLANNED INTERVENTIONS: Therapeutic exercises, Neuromuscular re-education, Balance training, Gait training, Patient/Family education, Joint mobilization, Joint manipulation, Stair training, DME instructions, Aquatic Therapy, Dry Needling, Cryotherapy, Moist heat, Manual therapy, and Re-evaluation  PLAN FOR NEXT SESSION:   Gait training. Terminal knee extension exercise, Long Arc Quads with [place] if need be, and side step up and front step up   Ellin Goodie PT, DPT  [facility] [address] [place], [address] Phone: [phone]   Fax:  [phone]

## 2023-02-27 NOTE — Progress Notes (Signed)
Post-Op Visit Note   Patient: James [name]           Date of Birth: 1945-12-15           MRN: [id number] Visit Date: 02/27/2023 PCP: James Nam, MD   Assessment & Plan:  Chief Complaint:  Chief Complaint  Patient presents with   Left Knee - Routine Post Op    L TKA (surgery date 01-17-23)   Visit Diagnoses:  1. S/P total knee arthroplasty, left     Plan: James [name] is a 77 year old patient underwent left total knee replacement 01/17/2023.  Doing well.  On examination range of motion 5-1 10.  No calf tenderness negative Homans.  Incision intact.  Taking extra drink Tylenol for pain.  Sleeping okay.  Getting around well.  Doing [place] therapy plus home exercise program.  Plan at this time is continue with range of motion and strengthening exercises and follow-up in 6 weeks for final check.  Follow-Up Instructions: No follow-ups on file.   Orders:  No orders of the defined types were placed in this encounter.  No orders of the defined types were placed in this encounter.   Imaging: No results found.  PMFS History: Patient Active Problem List   Diagnosis Date Noted   Arthritis of left knee 02/03/2023   Anemia 01/30/2023   Dehydration 01/21/2023   Near syncope 01/20/2023   AKI (acute kidney injury) (HCC) 01/20/2023   Septic joint of left knee joint (HCC) 01/20/2023   Elevated lactic acid level 01/20/2023   Severe sepsis (HCC) 01/20/2023   OA (osteoarthritis) of knee 01/17/2023   S/P total knee replacement, left 01/17/2023   Pruritus 03/21/2022   Afib (HCC) 11/07/2021   Hyperglycemia 08/27/2021   Dysphagia 08/27/2021   Thoracic aortic aneurysm without rupture (HCC) 11/25/2019   Chronic heart failure with preserved ejection fraction (HFpEF) (HCC) 08/18/2018   Hyperlipidemia LDL goal <70 08/18/2018   Coronary artery disease involving native coronary artery of native heart without angina pectoris 05/16/2018   Unstable angina (HCC)    CHF (congestive heart  failure) (HCC) 05/09/2018   Benign prostatic hyperplasia with nocturia 05/07/2018   Dyspnea on exertion 02/19/2018   Persistent atrial fibrillation (HCC) 02/12/2018   Cardiac murmur 02/12/2018   Pain and swelling of left knee 08/07/2017   Healthcare maintenance 05/01/2016   Hematuria 03/06/2016   Hx of adenomatous colonic polyps 12/05/2015   Advance care planning 07/21/2014   Medicare annual wellness visit, subsequent 07/01/2012   External hemorrhoids 06/27/2011   Hypothyroidism 02/24/2008   HYPERCHOLESTEROLEMIA 02/21/2007   Essential hypertension 02/21/2007   DEGENERATIVE JOINT DISEASE, CERVICAL SPINE 02/21/2007   Past Medical History:  Diagnosis Date   (HFpEF) heart failure with preserved ejection fraction (HCC)    a. 05/2018 Echo: EF 55-60%, gr2 DD.   Acute lower GI bleeding after colonoscopy and polypectomy, resolved 12/12/2015   Ascending aortic aneurysm (HCC)    a. 02/2018 Echo: mildly dil Ao root/asc ao/arch; b. 05/2018 CTA Chest: 4.7cm fusiform aneurysm of Asc Ao.   CAD (coronary artery disease)    a. 05/2018 Cath/PCI: LM nl,LAD 30p, 90/99d/apical, LCX 27m, OM1/2/3 min irregs, RCA 85p (3.5x18 [place] DES).   Cardiac murmur    a. 02/2018 Echo: EF 60-65%, no rwma, mild AI, mildly dil Ao root/Asc Ao/Arch, Mild MR, mildly dil LA. Nl RV fxn.   Cataract    bilateral surgery to remove   CHF (congestive heart failure) (HCC)    Colon polyp    Constipation  Dysrhythmia    Hx A. Fib   Essential hypertension    Hemorrhoids    Hepatitis    self resolved, likely food exposure, 1974.    History of cardiovascular stress test    a. 02/2018 Myoview: EF 55-65%, small/mild apical defect w/ nl wall motion ->attenuation artifact. No ischemia. Low risk study.   Hx of adenomatous colonic polyps 12/05/2015   Hyperlipidemia    Hypothyroidism    Mitral regurgitation    a. 110/2019 Echo: EF 55-60%. Gr2 DD. Triv AI. Ao root 38mm. Mild MR. Mod dil LA, mildly dil RA.   Osteoarthritis     Persistent atrial fibrillation (HCC)    a. Dx 02/2018. CHA2DS2VASc = 2-->Eliquis; b. 03/2018 DCCV (200J); c. 03/2018 recurrent Afib-->flecainide started 04/2018;  d. 05/06/2018 s/p successful DCCV - 200J x 2-->on amio.   Skin cancer    basal cell, R ear, MOHS    Family History  Problem Relation Age of Onset   Stroke Mother    Lung cancer Maternal Grandfather        smoker   Stroke Maternal Grandfather    Heart disease Paternal Grandfather        MI, old age   Colon cancer Neg Hx    Colon polyps Neg Hx    Esophageal cancer Neg Hx    Rectal cancer Neg Hx    Stomach cancer Neg Hx    Bladder Cancer Neg Hx    Prostate cancer Neg Hx     Past Surgical History:  Procedure Laterality Date   BROW LIFT Bilateral 10/23/2016   Procedure: BLEPHAROPLASTY upper eyelid with excess skin;  Surgeon: James Riches, MD;  Location: [facility];  Service: Ophthalmology;  Laterality: Bilateral;  MAC   CARDIOVERSION N/A 03/18/2018   Procedure: CARDIOVERSION;  Surgeon: James Kendall, MD;  Location: [facility] ORS;  Service: Cardiovascular;  Laterality: N/A;   CARDIOVERSION N/A 05/06/2018   Procedure: CARDIOVERSION;  Surgeon: James Kendall, MD;  Location: [facility] ORS;  Service: Cardiovascular;  Laterality: N/A;   CATARACT EXTRACTION  1986   OD   CATARACT EXTRACTION Bilateral 1995   x 2 for right and left    COLONOSCOPY     CORONARY STENT INTERVENTION N/A 05/13/2018   Procedure: CORONARY STENT INTERVENTION;  Surgeon: James Kendall, MD;  Location: [facility] INV[facility]IVE CV LAB;  Service: Cardiovascular;  Laterality: N/A;   ECTROPION REPAIR Bilateral 10/23/2016   Procedure: REPAIR OF ECTROPION sutures, extensive;  Surgeon: James Riches, MD;  Location: [facility];  Service: Ophthalmology;  Laterality: Bilateral;   LIPOMA EXCISION  06/1997   left neck (Juengel)   MOHS SURGERY Right 2013   behind right ear   RIGHT/LEFT HEART CATH AND CORONARY ANGIOGRAPHY N/A 05/12/2018   Procedure: RIGHT/LEFT HEART CATH AND  CORONARY ANGIOGRAPHY;  Surgeon: James Ouch, MD;  Location: [facility] IN[facility] LAB;  Service: Cardiovascular;  Laterality: N/A;   TONSILLECTOMY     TOTAL KNEE ARTHROPLASTY Left 01/17/2023   Procedure: LEFT TOTAL KNEE ARTHROPLASTY;  Surgeon: James Copa, MD;  Location: [facility] OR;  Service: Orthopedics;  Laterality: Left;   VASECTOMY  1982   Social History   Occupational History   Occupation: Retired    Associate Professor: RETIRED    Comment: 1  Tobacco Use   Smoking status: Former    Current packs/day: 0.00    Average packs/day: 2.0 packs/day for 20.0 years (40.0 ttl pk-yrs)    Types: Cigarettes    Start date: 08/07/1963  Quit date: 08/07/1983    Years since quitting: 39.5    Passive exposure: Past   Smokeless tobacco: Never   Tobacco comments:    Former smoker 11/17/21 quit over 40 years ago  Vaping Use   Vaping status: Never Used  Substance and Sexual Activity   Alcohol use: Yes    Comment: 1-2 per day   Drug use: No   Sexual activity: Yes    Partners: Female

## 2023-02-28 ENCOUNTER — Encounter: Payer: Self-pay | Admitting: Internal Medicine

## 2023-02-28 ENCOUNTER — Other Ambulatory Visit: Payer: Self-pay

## 2023-02-28 ENCOUNTER — Encounter: Payer: Self-pay | Admitting: Orthopedic Surgery

## 2023-02-28 MED ORDER — AMOXICILLIN 500 MG PO TABS: ORAL_TABLET | ORAL | 0 refills | Status: DC

## 2023-02-28 NOTE — Telephone Encounter (Signed)
Hi [name].  Can you send in some prophylactic antibiotics for James [name] for his tooth cleaning to hopefully be done at the earliest 3 months out from surgery on his knee replacement.  Thanks

## 2023-03-06 ENCOUNTER — Ambulatory Visit: Payer: Medicare Other | Admitting: Physical Therapy

## 2023-03-06 DIAGNOSIS — M25562 Pain in left knee: Secondary | ICD-10-CM

## 2023-03-06 DIAGNOSIS — Z96652 Presence of left artificial knee joint: Secondary | ICD-10-CM | POA: Diagnosis not present

## 2023-03-06 NOTE — Therapy (Signed)
OUTPATIENT PHYSICAL THERAPY LOWER EXTREMITY TREATMENT    Patient Name: James [name] MRN: [id number] DOB:Dec 18, 1945, 77 y.o., male Today's Date: 03/06/2023  END OF SESSION:  PT End of Session - 03/06/23 1726     Visit Number 4    Number of Visits 20    Date for PT Re-Evaluation 05/01/23    Authorization Type [id number]    Authorization - Visit Number 4    Authorization - Number of Visits 20    Progress Note Due on Visit 10    PT Start Time 1645    PT Stop Time 1725    PT Time Calculation (min) 40 min    Activity Tolerance Patient tolerated treatment well    Behavior During Therapy WFL for tasks assessed/performed              Past Medical History:  Diagnosis Date   (HFpEF) heart failure with preserved ejection fraction (HCC)    a. 05/2018 Echo: EF 55-60%, gr2 DD.   Acute lower GI bleeding after colonoscopy and polypectomy, resolved 12/12/2015   Ascending aortic aneurysm (HCC)    a. 02/2018 Echo: mildly dil Ao root/asc ao/arch; b. 05/2018 CTA Chest: 4.7cm fusiform aneurysm of Asc Ao.   CAD (coronary artery disease)    a. 05/2018 Cath/PCI: LM nl,LAD 30p, 90/99d/apical, LCX 55m, OM1/2/3 min irregs, RCA 85p (3.5x18 [place] DES).   Cardiac murmur    a. 02/2018 Echo: EF 60-65%, no rwma, mild AI, mildly dil Ao root/Asc Ao/Arch, Mild MR, mildly dil LA. Nl RV fxn.   Cataract    bilateral surgery to remove   CHF (congestive heart failure) (HCC)    Colon polyp    Constipation    Dysrhythmia    Hx A. Fib   Essential hypertension    Hemorrhoids    Hepatitis    self resolved, likely food exposure, 1974.    History of cardiovascular stress test    a. 02/2018 Myoview: EF 55-65%, small/mild apical defect w/ nl wall motion ->attenuation artifact. No ischemia. Low risk study.   Hx of adenomatous colonic polyps 12/05/2015   Hyperlipidemia    Hypothyroidism    Mitral regurgitation    a. 110/2019 Echo: EF 55-60%. Gr2 DD. Triv AI. Ao root 38mm. Mild MR. Mod dil LA, mildly dil RA.    Osteoarthritis    Persistent atrial fibrillation (HCC)    a. Dx 02/2018. CHA2DS2VASc = 2-->Eliquis; b. 03/2018 DCCV (200J); c. 03/2018 recurrent Afib-->flecainide started 04/2018;  d. 05/06/2018 s/p successful DCCV - 200J x 2-->on amio.   Skin cancer    basal cell, R ear, MOHS   Past Surgical History:  Procedure Laterality Date   BROW LIFT Bilateral 10/23/2016   Procedure: BLEPHAROPLASTY upper eyelid with excess skin;  Surgeon: Imagene Riches, MD;  Location: [facility];  Service: Ophthalmology;  Laterality: Bilateral;  MAC   CARDIOVERSION N/A 03/18/2018   Procedure: CARDIOVERSION;  Surgeon: Yvonne Kendall, MD;  Location: [facility];  Service: Cardiovascular;  Laterality: N/A;   CARDIOVERSION N/A 05/06/2018   Procedure: CARDIOVERSION;  Surgeon: Yvonne Kendall, MD;  Location: [facility];  Service: Cardiovascular;  Laterality: N/A;   CATARACT EXTRACTION  1986   OD   CATARACT EXTRACTION Bilateral 1995   x 2 for right and left    COLONOSCOPY     CORONARY STENT INTERVENTION N/A 05/13/2018   Procedure: CORONARY STENT INTERVENTION;  Surgeon: Yvonne Kendall, MD;  Location: [facility];  Service: Cardiovascular;  Laterality: N/A;  ECTROPION REPAIR Bilateral 10/23/2016   Procedure: REPAIR OF ECTROPION sutures, extensive;  Surgeon: Imagene Riches, MD;  Location: [facility];  Service: Ophthalmology;  Laterality: Bilateral;   LIPOMA EXCISION  06/1997   left neck ([place])   MOHS SURGERY Right 2013   behind right ear   RIGHT/LEFT HEART CATH AND CORONARY ANGIOGRAPHY N/A 05/12/2018   Procedure: RIGHT/LEFT HEART CATH AND CORONARY ANGIOGRAPHY;  Surgeon: Iran Ouch, MD;  Location: [facility];  Service: Cardiovascular;  Laterality: N/A;   TONSILLECTOMY     TOTAL KNEE ARTHROPLASTY Left 01/17/2023   Procedure: LEFT TOTAL KNEE ARTHROPLASTY;  Surgeon: Cammy Copa, MD;  Location: [facility];  Service: Orthopedics;  Laterality: Left;   VASECTOMY  1982   Patient Active Problem  List   Diagnosis Date Noted   Arthritis of left knee 02/03/2023   Anemia 01/30/2023   Dehydration 01/21/2023   Near syncope 01/20/2023   AKI (acute kidney injury) (HCC) 01/20/2023   Septic joint of left knee joint (HCC) 01/20/2023   Elevated lactic acid level 01/20/2023   Severe sepsis (HCC) 01/20/2023   OA (osteoarthritis) of knee 01/17/2023   S/P total knee replacement, left 01/17/2023   Pruritus 03/21/2022   Afib (HCC) 11/07/2021   Hyperglycemia 08/27/2021   Dysphagia 08/27/2021   Thoracic aortic aneurysm without rupture (HCC) 11/25/2019   Chronic heart failure with preserved ejection fraction (HFpEF) (HCC) 08/18/2018   Hyperlipidemia LDL goal <70 08/18/2018   Coronary artery disease involving native coronary artery of native heart without angina pectoris 05/16/2018   Unstable angina (HCC)    CHF (congestive heart failure) (HCC) 05/09/2018   Benign prostatic hyperplasia with nocturia 05/07/2018   Dyspnea on exertion 02/19/2018   Persistent atrial fibrillation (HCC) 02/12/2018   Cardiac murmur 02/12/2018   Pain and swelling of left knee 08/07/2017   Healthcare maintenance 05/01/2016   Hematuria 03/06/2016   Hx of adenomatous colonic polyps 12/05/2015   Advance care planning 07/21/2014   Medicare annual wellness visit, subsequent 07/01/2012   External hemorrhoids 06/27/2011   Hypothyroidism 02/24/2008   HYPERCHOLESTEROLEMIA 02/21/2007   Essential hypertension 02/21/2007   DEGENERATIVE JOINT DISEASE, CERVICAL SPINE 02/21/2007    PCP: Dr. Crawford Givens   REFERRING PROVIDER: Harriette Bouillon PA-C  REFERRING DIAG: Left TKA   THERAPY DIAG:  Left knee pain, unspecified chronicity  Rationale for Evaluation and Treatment: Rehabilitation  ONSET DATE: 01/17/23  SUBJECTIVE:   SUBJECTIVE STATEMENT: Pt reports that he feels like he is making good progress since last session with ability to walk around house without cane and to perform knee hang with weight.   PERTINENT  HISTORY: Pt reports that he just finished HHPT and he reports that it went well. His pain is well controlled and he is currently only taking two tylenols at the beginning of the day.   PAIN:  Are you having pain? No  PRECAUTIONS: None  RED FLAGS: None   WEIGHT BEARING RESTRICTIONS: No  FALLS:  Has patient fallen in last 6 months? No  LIVING ENVIRONMENT: Lives with: lives with their spouse Lives in: House/apartment Stairs: No Has following equipment at home: Single point cane, Environmental consultant - 2 wheeled, and shower chair  OCCUPATION: Retired   PLOF: Independent  PATIENT GOALS: Return to walking without a single point cane and hiking   NEXT MD VISIT: 02/27/23  OBJECTIVE:   VITALS: BP 119/75 HR 78 SpO2 100   DIAGNOSTIC FINDINGS: AP and lateral views of the knee reviewed.  Total knee  arthroplasty  prosthesis in good position and alignment without any complicating  features.  There is no evidence of dislocation, periprosthetic fracture,  patella baja/alta   PATIENT SURVEYS:  FOTO 44/100   COGNITION: Overall cognitive status: Within functional limits for tasks assessed     SENSATION: WFL  EDEMA:  Circumferential: R/L 16"/18"  MUSCLE LENGTH: Hamstrings: Right NT deg; Left NT deg Maisie Fus test: Right NT deg; Left NT  deg  POSTURE: rounded shoulders  PALPATION: Anterior surface of left knee   LOWER EXTREMITY ROM:  Active/Passive ROM Right eval Left eval  Hip flexion    Hip extension    Hip abduction    Hip adduction    Hip internal rotation    Hip external rotation    Knee flexion 130/135 115/120  Knee extension 0/-1 4/3*  Ankle dorsiflexion    Ankle plantarflexion    Ankle inversion    Ankle eversion     (Blank rows = not tested)  LOWER EXTREMITY MMT:  MMT Right eval Left eval  Hip flexion    Hip extension    Hip abduction    Hip adduction    Hip internal rotation    Hip external rotation    Knee flexion 4+ 4+  Knee extension 4+ 3-  Ankle  dorsiflexion    Ankle plantarflexion    Ankle inversion    Ankle eversion     (Blank rows = not tested)   FUNCTIONAL TESTS:  Squat: Posterior knee translation with forward trunk lean   GAIT: Distance walked: 50 ft  Assistive device utilized: Single point cane Level of assistance: Modified independence Comments: Left sided antalgic gait    TODAY'S TREATMENT:                                                                                                                              DATE:   03/06/23: All single leg exercises performed on LLE  Matrix Recumbent seat level 17 for 5 min  Step Up on 4 inch step BUE support 1 x 10  Step Up on 4 inch step with 1 UE support 1 x 10   Step Up on 6 inch step with BUE Support 1 x 10  Heel to toe gait exercise with 1 UE support 20 ft x 8   02/27/23: All single leg exercises performed on LLE           Matrix Recumbent seat level 13 for 5 min           OMEGA Leg Press #75 1 x 10           OMEGA Leg Press #95 1 x 10           Standing Hip Abduction with BUE support using mat table 3 x 10          Standing Calf Raises with BUE support 1 x 10  Standing Calf Raises with 1 UE support 1 x 10           Standing Calf Raises with 2 finger support 1 x 10           LAQ 1 x 10           Seated HS Stretch 1 x 30 sec           Long Sitting HS and Calf Stretch 2 x 30 sec    02/25/23:          Matrix recumbent bike seat at 16 for 5 min           HS Length: 80/80 deg hip flexion           Ely's Test: Positive Bilaterally           Pone Quad Stretch 2 x 30 sec            Hip MMT:            -Hip Abd R/L 4/4           -Hip Ext R/L 4/4            Sit to Stand with BUE support 3 x 10           -1 UE on seated surface and 1 UE on chair            Partial Lunge with 1 UE support  3 x 10   02/20/23: Seated knee flexion AAROM 1 x 10 with 3 sec hold  Straight leg raise 1 x 10  -min VC to decrease speed of eccentric portion of exercise  -Pt shows  extensor lag Mini-Squat 1 x 10  -mod VC to decrease forward flexion and to maintain knees behind toes  Seated passive knee extension with #5 lb AW in backpack 5 min    PATIENT EDUCATION:  Education details: form and technique for correct performance of exercise  Person educated: Patient Education method: Explanation, Demonstration, Verbal cues, and Handouts Education comprehension: verbalized understanding, returned demonstration, and verbal cues required  HOME EXERCISE PROGRAM:  Access Code: [email or web address] URL: [email or web address] Date: 02/27/2023 Prepared by: Ellin Goodie  Exercises - Heel-Toe Walking  - 1 x daily - 10 reps - Seated hamstring and calf stretch   - 1 x daily - 3 reps - 60 sec  hold - Prone Quadriceps Stretch with Strap  - 1 x daily - 3 reps - 30-60 sec hold - Sit to Stand  - 3-4 x weekly - 3 sets - 10 reps - Standing Partial Lunge  - 3-4 x weekly - 3 sets - 10 reps - Seated Knee Extension Stretch with Chair  - 1 x daily - 2-3 reps - 5- 10 min hold - Standing Hip Abduction with Counter Support  - 3-4 x weekly - 3 sets - 10 reps   ASSESSMENT:  CLINICAL IMPRESSION: Pt presents s/p 7 weeks left TKA. Pt is progressing with improvement in left knee strength with ability to perform step ups indicating improvement in quad control. He is still limited in ambulating with use of SPC and UE support to maintain stable gait. He will continue to benefit from skilled PT to address these aforementioned deficits to return to walking and hiking for recreational activity.    OBJECTIVE IMPAIRMENTS: Abnormal gait, difficulty walking, decreased ROM, decreased strength, hypomobility, increased edema, impaired flexibility, obesity, and pain.   ACTIVITY LIMITATIONS: carrying, lifting, bending, standing, squatting, stairs, and caring for  others  PARTICIPATION LIMITATIONS: driving, shopping, community activity, and yard work  PERSONAL FACTORS: 3+ comorbidities: h/o CHF, Afib,  HLD, and hypothyroidism  are also affecting patient's functional outcome.   REHAB POTENTIAL: Good  CLINICAL DECISION MAKING: Stable/uncomplicated  EVALUATION COMPLEXITY: Low   GOALS: Goals reviewed with patient? No  SHORT TERM GOALS: Target date: 03/06/2023  Pt will be independent with HEP in order to improve strength and balance in order to decrease fall risk and improve function at home and work. Baseline: Has not attempted performing at home yet  Goal status: ONGOING  2.  Patient will ambulate without use of an assistive device as a sign of improve left knee function.  Baseline: Using SPC  Goal status: ONGOING     LONG TERM GOALS: Target date: 05/01/2023  Patient will have improved function and activity level as evidenced by an increase in FOTO score by 10 points or more.  Baseline: 44/100 with target of 63  Goal status: ONGOING   2.  Patient will improve left knee AROM to be symmetrical to right knee ROM for improved gait mechanics and gait stability.  Baseline: Knee Ext R/L 0/4 Goal status: ONGOING   3.  Patient will improve left knee strength to by symmetrical to right knee for improved gait mechanics and gait stability  Baseline: Knee Ext R/L 3-/4+, Hip Flex, Ext, Abd NT  Goal status: ONGOING     PLAN:  PT FREQUENCY: 1-2x/week  PT DURATION: 10 weeks  PLANNED INTERVENTIONS: Therapeutic exercises, Neuromuscular re-education, Balance training, Gait training, Patient/Family education, Joint mobilization, Joint manipulation, Stair training, DME instructions, Aquatic Therapy, Dry Needling, Cryotherapy, Moist heat, Manual therapy, and Re-evaluation  PLAN FOR NEXT SESSION:   TM to warmup. Reassess knee ROM and strength. FOTO    Ellin Goodie PT, DPT  [facility] [address], [place], [address] Phone: [phone]   Fax:  [phone]

## 2023-03-11 ENCOUNTER — Ambulatory Visit: Payer: Medicare Other | Attending: Surgical

## 2023-03-11 DIAGNOSIS — M25562 Pain in left knee: Secondary | ICD-10-CM | POA: Insufficient documentation

## 2023-03-11 NOTE — Therapy (Signed)
OUTPATIENT PHYSICAL THERAPY LOWER EXTREMITY TREATMENT    Patient Name: James [name] MRN: [id number] DOB:1945/12/14, 77 y.o., male Today's Date: 03/11/2023  END OF SESSION:  PT End of Session - 03/11/23 0820     Visit Number 5    Number of Visits 20    Date for PT Re-Evaluation 05/01/23    Authorization Type [id number]    Authorization - Number of Visits 20    Progress Note Due on Visit 10    PT Start Time 0820    PT Stop Time 0850    PT Time Calculation (min) 30 min    Activity Tolerance Patient tolerated treatment well    Behavior During Therapy [facility] for tasks assessed/performed               Past Medical History:  Diagnosis Date   (HFpEF) heart failure with preserved ejection fraction (HCC)    a. 05/2018 Echo: EF 55-60%, gr2 DD.   Acute lower GI bleeding after colonoscopy and polypectomy, resolved 12/12/2015   Ascending aortic aneurysm (HCC)    a. 02/2018 Echo: mildly dil Ao root/asc ao/arch; b. 05/2018 CTA Chest: 4.7cm fusiform aneurysm of Asc Ao.   CAD (coronary artery disease)    a. 05/2018 Cath/PCI: LM nl,LAD 30p, 90/99d/apical, LCX 66m, OM1/2/3 min irregs, RCA 85p (3.5x18 [place] DES).   Cardiac murmur    a. 02/2018 Echo: EF 60-65%, no rwma, mild AI, mildly dil Ao root/Asc Ao/Arch, Mild MR, mildly dil LA. Nl RV fxn.   Cataract    bilateral surgery to remove   CHF (congestive heart failure) (HCC)    Colon polyp    Constipation    Dysrhythmia    Hx A. Fib   Essential hypertension    Hemorrhoids    Hepatitis    self resolved, likely food exposure, 1974.    History of cardiovascular stress test    a. 02/2018 Myoview: EF 55-65%, small/mild apical defect w/ nl wall motion ->attenuation artifact. No ischemia. Low risk study.   Hx of adenomatous colonic polyps 12/05/2015   Hyperlipidemia    Hypothyroidism    Mitral regurgitation    a. 110/2019 Echo: EF 55-60%. Gr2 DD. Triv AI. Ao root 38mm. Mild MR. Mod dil LA, mildly dil RA.   Osteoarthritis    Persistent  atrial fibrillation (HCC)    a. Dx 02/2018. CHA2DS2VASc = 2-->Eliquis; b. 03/2018 DCCV (200J); c. 03/2018 recurrent Afib-->flecainide started 04/2018;  d. 05/06/2018 s/p successful DCCV - 200J x 2-->on amio.   Skin cancer    basal cell, R ear, MOHS   Past Surgical History:  Procedure Laterality Date   BROW LIFT Bilateral 10/23/2016   Procedure: BLEPHAROPLASTY upper eyelid with excess skin;  Surgeon: Imagene Riches, MD;  Location: [facility];  Service: Ophthalmology;  Laterality: Bilateral;  MAC   CARDIOVERSION N/A 03/18/2018   Procedure: CARDIOVERSION;  Surgeon: Yvonne Kendall, MD;  Location: [facility];  Service: Cardiovascular;  Laterality: N/A;   CARDIOVERSION N/A 05/06/2018   Procedure: CARDIOVERSION;  Surgeon: Yvonne Kendall, MD;  Location: [facility];  Service: Cardiovascular;  Laterality: N/A;   CATARACT EXTRACTION  1986   OD   CATARACT EXTRACTION Bilateral 1995   x 2 for right and left    COLONOSCOPY     CORONARY STENT INTERVENTION N/A 05/13/2018   Procedure: CORONARY STENT INTERVENTION;  Surgeon: Yvonne Kendall, MD;  Location: [facility];  Service: Cardiovascular;  Laterality: N/A;   ECTROPION REPAIR Bilateral 10/23/2016   Procedure:  REPAIR OF ECTROPION sutures, extensive;  Surgeon: Imagene Riches, MD;  Location: [facility];  Service: Ophthalmology;  Laterality: Bilateral;   LIPOMA EXCISION  06/1997   left neck ([place]el)   MOHS SURGERY Right 2013   behind right ear   RIGHT/LEFT HEART CATH AND CORONARY ANGIOGRAPHY N/A 05/12/2018   Procedure: RIGHT/LEFT HEART CATH AND CORONARY ANGIOGRAPHY;  Surgeon: Iran Ouch, MD;  Location: [facility];  Service: Cardiovascular;  Laterality: N/A;   TONSILLECTOMY     TOTAL KNEE ARTHROPLASTY Left 01/17/2023   Procedure: LEFT TOTAL KNEE ARTHROPLASTY;  Surgeon: Cammy Copa, MD;  Location: [facility];  Service: Orthopedics;  Laterality: Left;   VASECTOMY  1982   Patient Active Problem List   Diagnosis Date Noted    Arthritis of left knee 02/03/2023   Anemia 01/30/2023   Dehydration 01/21/2023   Near syncope 01/20/2023   AKI (acute kidney injury) (HCC) 01/20/2023   Septic joint of left knee joint (HCC) 01/20/2023   Elevated lactic acid level 01/20/2023   Severe sepsis (HCC) 01/20/2023   OA (osteoarthritis) of knee 01/17/2023   S/P total knee replacement, left 01/17/2023   Pruritus 03/21/2022   Afib (HCC) 11/07/2021   Hyperglycemia 08/27/2021   Dysphagia 08/27/2021   Thoracic aortic aneurysm without rupture (HCC) 11/25/2019   Chronic heart failure with preserved ejection fraction (HFpEF) (HCC) 08/18/2018   Hyperlipidemia LDL goal <70 08/18/2018   Coronary artery disease involving native coronary artery of native heart without angina pectoris 05/16/2018   Unstable angina (HCC)    CHF (congestive heart failure) (HCC) 05/09/2018   Benign prostatic hyperplasia with nocturia 05/07/2018   Dyspnea on exertion 02/19/2018   Persistent atrial fibrillation (HCC) 02/12/2018   Cardiac murmur 02/12/2018   Pain and swelling of left knee 08/07/2017   Healthcare maintenance 05/01/2016   Hematuria 03/06/2016   Hx of adenomatous colonic polyps 12/05/2015   Advance care planning 07/21/2014   Medicare annual wellness visit, subsequent 07/01/2012   External hemorrhoids 06/27/2011   Hypothyroidism 02/24/2008   HYPERCHOLESTEROLEMIA 02/21/2007   Essential hypertension 02/21/2007   DEGENERATIVE JOINT DISEASE, CERVICAL SPINE 02/21/2007    PCP: Dr. Crawford Givens   REFERRING PROVIDER: Harriette Bouillon PA-C  REFERRING DIAG: Left TKA   THERAPY DIAG:  Left knee pain, unspecified chronicity  Rationale for Evaluation and Treatment: Rehabilitation  ONSET DATE: 01/17/23  SUBJECTIVE:   SUBJECTIVE STATEMENT: Doing ok. Realized a couple of times this week that he did not take the cane with him.   PERTINENT HISTORY: Pt reports that he just finished HHPT and he reports that it went well. His pain is well  controlled and he is currently only taking two tylenols at the beginning of the day.   PAIN:  Are you having pain? No  PRECAUTIONS: None  RED FLAGS: None   WEIGHT BEARING RESTRICTIONS: No  FALLS:  Has patient fallen in last 6 months? No  LIVING ENVIRONMENT: Lives with: lives with their spouse Lives in: House/apartment Stairs: No Has following equipment at home: Single point cane, Environmental consultant - 2 wheeled, and shower chair  OCCUPATION: Retired   PLOF: Independent  PATIENT GOALS: Return to walking without a single point cane and hiking   NEXT MD VISIT: 02/27/23  OBJECTIVE:   VITALS: BP 119/75 HR 78 SpO2 100   DIAGNOSTIC FINDINGS: AP and lateral views of the knee reviewed.  Total knee arthroplasty  prosthesis in good position and alignment without any complicating  features.  There is no evidence  of dislocation, periprosthetic fracture,  patella baja/alta   PATIENT SURVEYS:  FOTO 44/100   COGNITION: Overall cognitive status: Within functional limits for tasks assessed     SENSATION: WFL  EDEMA:  Circumferential: R/L 16"/18"  MUSCLE LENGTH: Hamstrings: Right NT deg; Left NT deg Maisie Fus test: Right NT deg; Left NT  deg  POSTURE: rounded shoulders  PALPATION: Anterior surface of left knee   LOWER EXTREMITY ROM:  Active/Passive ROM Right eval Left eval  Hip flexion    Hip extension    Hip abduction    Hip adduction    Hip internal rotation    Hip external rotation    Knee flexion 130/135 115/120  Knee extension 0/-1 4/3*  Ankle dorsiflexion    Ankle plantarflexion    Ankle inversion    Ankle eversion     (Blank rows = not tested)  LOWER EXTREMITY MMT:  MMT Right eval Left eval  Hip flexion    Hip extension    Hip abduction    Hip adduction    Hip internal rotation    Hip external rotation    Knee flexion 4+ 4+  Knee extension 4+ 3-  Ankle dorsiflexion    Ankle plantarflexion    Ankle inversion    Ankle eversion     (Blank rows = not  tested)   FUNCTIONAL TESTS:  Squat: Posterior knee translation with forward trunk lean   GAIT: Distance walked: 50 ft  Assistive device utilized: Single point cane Level of assistance: Modified independence Comments: Left sided antalgic gait    TODAY'S TREATMENT:                                                                                                                              DATE:     03/11/23: All single leg exercises performed on LLE  Matrix Recumbent seat level 17 for 5 min  Step Up on 4 inch step BUE support 1 x 10  Step Up on 4 inch step with 1 UE support 3 x 10   Step Up on 6 inch step with BUE Support 3 x 10  SLS with B UE assist to promote R LE single leg stance strength during gait 10x3 with 5 second holds Heel to toe gait exercise with 1 UE support 20 ft x 8       03/06/23: All single leg exercises performed on LLE  Matrix Recumbent seat level 17 for 5 min  Step Up on 4 inch step BUE support 1 x 10  Step Up on 4 inch step with 1 UE support 1 x 10   Step Up on 6 inch step with BUE Support 1 x 10  Heel to toe gait exercise with 1 UE support 20 ft x 8   02/27/23: All single leg exercises performed on LLE           Matrix Recumbent seat level 13 for 5 min  OMEGA Leg Press #75 1 x 10           OMEGA Leg Press #95 1 x 10           Standing Hip Abduction with BUE support using mat table 3 x 10          Standing Calf Raises with BUE support 1 x 10           Standing Calf Raises with 1 UE support 1 x 10           Standing Calf Raises with 2 finger support 1 x 10           LAQ 1 x 10           Seated HS Stretch 1 x 30 sec           Long Sitting HS and Calf Stretch 2 x 30 sec    02/25/23:          Matrix recumbent bike seat at 16 for 5 min           HS Length: 80/80 deg hip flexion           Ely's Test: Positive Bilaterally           Pone Quad Stretch 2 x 30 sec            Hip MMT:            -Hip Abd R/L 4/4           -Hip Ext R/L 4/4             Sit to Stand with BUE support 3 x 10           -1 UE on seated surface and 1 UE on chair            Partial Lunge with 1 UE support  3 x 10   02/20/23: Seated knee flexion AAROM 1 x 10 with 3 sec hold  Straight leg raise 1 x 10  -min VC to decrease speed of eccentric portion of exercise  -Pt shows extensor lag Mini-Squat 1 x 10  -mod VC to decrease forward flexion and to maintain knees behind toes  Seated passive knee extension with #5 lb AW in backpack 5 min    PATIENT EDUCATION:  Education details: form and technique for correct performance of exercise  Person educated: Patient Education method: Explanation, Demonstration, Verbal cues, and Handouts Education comprehension: verbalized understanding, returned demonstration, and verbal cues required  HOME EXERCISE PROGRAM:  Access Code: [email or web address] URL: [email or web address] Date: 02/27/2023 Prepared by: Ellin Goodie  Exercises - Heel-Toe Walking  - 1 x daily - 10 reps - Seated hamstring and calf stretch   - 1 x daily - 3 reps - 60 sec  hold - Prone Quadriceps Stretch with Strap  - 1 x daily - 3 reps - 30-60 sec hold - Sit to Stand  - 3-4 x weekly - 3 sets - 10 reps - Standing Partial Lunge  - 3-4 x weekly - 3 sets - 10 reps - Seated Knee Extension Stretch with Chair  - 1 x daily - 2-3 reps - 5- 10 min hold - Standing Hip Abduction with Counter Support  - 3-4 x weekly - 3 sets - 10 reps   ASSESSMENT:  CLINICAL IMPRESSION:  Continued with evaluating PT POC. Continued working on improving L quadriceps muscle strength to improve ability to ambulate with less difficulty and less need for  AD. Pt tolerated session well without aggravation of symptoms. Pt will benefit from continued skilled PT services to improve strength, function, and ability to ambulate with less difficulty.     OBJECTIVE IMPAIRMENTS: Abnormal gait, difficulty walking, decreased ROM, decreased strength, hypomobility, increased edema, impaired  flexibility, obesity, and pain.   ACTIVITY LIMITATIONS: carrying, lifting, bending, standing, squatting, stairs, and caring for others  PARTICIPATION LIMITATIONS: driving, shopping, community activity, and yard work  PERSONAL FACTORS: 3+ comorbidities: h/o CHF, Afib, HLD, and hypothyroidism  are also affecting patient's functional outcome.   REHAB POTENTIAL: Good  CLINICAL DECISION MAKING: Stable/uncomplicated  EVALUATION COMPLEXITY: Low   GOALS: Goals reviewed with patient? No  SHORT TERM GOALS: Target date: 03/06/2023  Pt will be independent with HEP in order to improve strength and balance in order to decrease fall risk and improve function at home and work. Baseline: Has not attempted performing at home yet  Goal status: ONGOING  2.  Patient will ambulate without use of an assistive device as a sign of improve left knee function.  Baseline: Using SPC  Goal status: ONGOING     LONG TERM GOALS: Target date: 05/01/2023  Patient will have improved function and activity level as evidenced by an increase in FOTO score by 10 points or more.  Baseline: 44/100 with target of 63  Goal status: ONGOING   2.  Patient will improve left knee AROM to be symmetrical to right knee ROM for improved gait mechanics and gait stability.  Baseline: Knee Ext R/L 0/4 Goal status: ONGOING   3.  Patient will improve left knee strength to by symmetrical to right knee for improved gait mechanics and gait stability  Baseline: Knee Ext R/L 3-/4+, Hip Flex, Ext, Abd NT  Goal status: ONGOING     PLAN:  PT FREQUENCY: 1-2x/week  PT DURATION: 10 weeks  PLANNED INTERVENTIONS: Therapeutic exercises, Neuromuscular re-education, Balance training, Gait training, Patient/Family education, Joint mobilization, Joint manipulation, Stair training, DME instructions, Aquatic Therapy, Dry Needling, Cryotherapy, Moist heat, Manual therapy, and Re-evaluation  PLAN FOR NEXT SESSION:   TM to warmup. Reassess knee  ROM and strength. FOTO    Ellin Goodie PT, DPT  [facility] [address] [place], [address] Phone: [phone]   Fax:  [phone]

## 2023-03-14 ENCOUNTER — Ambulatory Visit: Payer: Medicare Other | Admitting: Physical Therapy

## 2023-03-14 ENCOUNTER — Encounter: Payer: Self-pay | Admitting: Physical Therapy

## 2023-03-14 DIAGNOSIS — M25562 Pain in left knee: Secondary | ICD-10-CM

## 2023-03-14 NOTE — Therapy (Signed)
OUTPATIENT PHYSICAL THERAPY LOWER EXTREMITY TREATMENT    Patient Name: James [name] MRN: [id number] DOB:1946-02-22, 77 y.o., male Today's Date: 03/14/2023  END OF SESSION:  PT End of Session - 03/14/23 0952     Visit Number 6    Number of Visits 20    Date for PT Re-Evaluation 05/01/23    Authorization Type [id number]    Authorization - Visit Number 6    Authorization - Number of Visits 20    Progress Note Due on Visit 10    PT Start Time 0950    PT Stop Time 1030    PT Time Calculation (min) 40 min    Activity Tolerance Patient tolerated treatment well    Behavior During Therapy [facility] for tasks assessed/performed               Past Medical History:  Diagnosis Date   (HFpEF) heart failure with preserved ejection fraction (HCC)    a. 05/2018 Echo: EF 55-60%, gr2 DD.   Acute lower GI bleeding after colonoscopy and polypectomy, resolved 12/12/2015   Ascending aortic aneurysm (HCC)    a. 02/2018 Echo: mildly dil Ao root/asc ao/arch; b. 05/2018 CTA Chest: 4.7cm fusiform aneurysm of Asc Ao.   CAD (coronary artery disease)    a. 05/2018 Cath/PCI: LM nl,LAD 30p, 90/99d/apical, LCX 32m, OM1/2/3 min irregs, RCA 85p (3.5x18 [place] DES).   Cardiac murmur    a. 02/2018 Echo: EF 60-65%, no rwma, mild AI, mildly dil Ao root/Asc Ao/Arch, Mild MR, mildly dil LA. Nl RV fxn.   Cataract    bilateral surgery to remove   CHF (congestive heart failure) (HCC)    Colon polyp    Constipation    Dysrhythmia    Hx A. Fib   Essential hypertension    Hemorrhoids    Hepatitis    self resolved, likely food exposure, 1974.    History of cardiovascular stress test    a. 02/2018 Myoview: EF 55-65%, small/mild apical defect w/ nl wall motion ->attenuation artifact. No ischemia. Low risk study.   Hx of adenomatous colonic polyps 12/05/2015   Hyperlipidemia    Hypothyroidism    Mitral regurgitation    a. 110/2019 Echo: EF 55-60%. Gr2 DD. Triv AI. Ao root 38mm. Mild MR. Mod dil LA, mildly dil RA.    Osteoarthritis    Persistent atrial fibrillation (HCC)    a. Dx 02/2018. CHA2DS2VASc = 2-->Eliquis; b. 03/2018 DCCV (200J); c. 03/2018 recurrent Afib-->flecainide started 04/2018;  d. 05/06/2018 s/p successful DCCV - 200J x 2-->on amio.   Skin cancer    basal cell, R ear, MOHS   Past Surgical History:  Procedure Laterality Date   BROW LIFT Bilateral 10/23/2016   Procedure: BLEPHAROPLASTY upper eyelid with excess skin;  Surgeon: Imagene Riches, MD;  Location: [facility];  Service: Ophthalmology;  Laterality: Bilateral;  MAC   CARDIOVERSION N/A 03/18/2018   Procedure: CARDIOVERSION;  Surgeon: Yvonne Kendall, MD;  Location: [facility] ORS;  Service: Cardiovascular;  Laterality: N/A;   CARDIOVERSION N/A 05/06/2018   Procedure: CARDIOVERSION;  Surgeon: Yvonne Kendall, MD;  Location: [facility] ORS;  Service: Cardiovascular;  Laterality: N/A;   CATARACT EXTRACTION  1986   OD   CATARACT EXTRACTION Bilateral 1995   x 2 for right and left    COLONOSCOPY     CORONARY STENT INTERVENTION N/A 05/13/2018   Procedure: CORONARY STENT INTERVENTION;  Surgeon: Yvonne Kendall, MD;  Location: [facility] INVASIVE CV LAB;  Service: Cardiovascular;  Laterality: N/A;  ECTROPION REPAIR Bilateral 10/23/2016   Procedure: REPAIR OF ECTROPION sutures, extensive;  Surgeon: Imagene Riches, MD;  Location: [facility];  Service: Ophthalmology;  Laterality: Bilateral;   LIPOMA EXCISION  06/1997   left neck ([place])   MOHS SURGERY Right 2013   behind right ear   RIGHT/LEFT HEART CATH AND CORONARY ANGIOGRAPHY N/A 05/12/2018   Procedure: RIGHT/LEFT HEART CATH AND CORONARY ANGIOGRAPHY;  Surgeon: Iran Ouch, MD;  Location: [facility] INVASIVE CV LAB;  Service: Cardiovascular;  Laterality: N/A;   TONSILLECTOMY     TOTAL KNEE ARTHROPLASTY Left 01/17/2023   Procedure: LEFT TOTAL KNEE ARTHROPLASTY;  Surgeon: Cammy Copa, MD;  Location: [facility] OR;  Service: Orthopedics;  Laterality: Left;   VASECTOMY  1982   Patient Active Problem  List   Diagnosis Date Noted   Arthritis of left knee 02/03/2023   Anemia 01/30/2023   Dehydration 01/21/2023   Near syncope 01/20/2023   AKI (acute kidney injury) (HCC) 01/20/2023   Septic joint of left knee joint (HCC) 01/20/2023   Elevated lactic acid level 01/20/2023   Severe sepsis (HCC) 01/20/2023   OA (osteoarthritis) of knee 01/17/2023   S/P total knee replacement, left 01/17/2023   Pruritus 03/21/2022   Afib (HCC) 11/07/2021   Hyperglycemia 08/27/2021   Dysphagia 08/27/2021   Thoracic aortic aneurysm without rupture (HCC) 11/25/2019   Chronic heart failure with preserved ejection fraction (HFpEF) (HCC) 08/18/2018   Hyperlipidemia LDL goal <70 08/18/2018   Coronary artery disease involving native coronary artery of native heart without angina pectoris 05/16/2018   Unstable angina (HCC)    CHF (congestive heart failure) (HCC) 05/09/2018   Benign prostatic hyperplasia with nocturia 05/07/2018   Dyspnea on exertion 02/19/2018   Persistent atrial fibrillation (HCC) 02/12/2018   Cardiac murmur 02/12/2018   Pain and swelling of left knee 08/07/2017   Healthcare maintenance 05/01/2016   Hematuria 03/06/2016   Hx of adenomatous colonic polyps 12/05/2015   Advance care planning 07/21/2014   Medicare annual wellness visit, subsequent 07/01/2012   External hemorrhoids 06/27/2011   Hypothyroidism 02/24/2008   HYPERCHOLESTEROLEMIA 02/21/2007   Essential hypertension 02/21/2007   DEGENERATIVE JOINT DISEASE, CERVICAL SPINE 02/21/2007    PCP: Dr. Crawford Givens   REFERRING PROVIDER: Harriette Bouillon PA-C  REFERRING DIAG: Left TKA   THERAPY DIAG:  Left knee pain, unspecified chronicity  Rationale for Evaluation and Treatment: Rehabilitation  ONSET DATE: 01/17/23  SUBJECTIVE:   SUBJECTIVE STATEMENT: Doing ok. Realized a couple of times this week that he did not take the cane with him.   PERTINENT HISTORY: Pt reports that he just finished HHPT and he reports that it went  well. His pain is well controlled and he is currently only taking two tylenols at the beginning of the day.   PAIN:  Are you having pain? No  PRECAUTIONS: None  RED FLAGS: None   WEIGHT BEARING RESTRICTIONS: No  FALLS:  Has patient fallen in last 6 months? No  LIVING ENVIRONMENT: Lives with: lives with their spouse Lives in: House/apartment Stairs: No Has following equipment at home: Single point cane, Environmental consultant - 2 wheeled, and shower chair  OCCUPATION: Retired   PLOF: Independent  PATIENT GOALS: Return to walking without a single point cane and hiking   NEXT MD VISIT: 02/27/23  OBJECTIVE:   VITALS: BP 119/75 HR 78 SpO2 100   DIAGNOSTIC FINDINGS: AP and lateral views of the knee reviewed.  Total knee arthroplasty  prosthesis in good position and alignment without any complicating  features.  There is no evidence of dislocation, periprosthetic fracture,  patella baja/alta   PATIENT SURVEYS:  FOTO 44/100   COGNITION: Overall cognitive status: Within functional limits for tasks assessed     SENSATION: WFL  EDEMA:  Circumferential: R/L 16"/18"  MUSCLE LENGTH: Hamstrings: Right NT deg; Left NT deg Maisie Fus test: Right NT deg; Left NT  deg  POSTURE: rounded shoulders  PALPATION: Anterior surface of left knee   LOWER EXTREMITY ROM:  Active/Passive ROM Right eval Left eval  Hip flexion    Hip extension    Hip abduction    Hip adduction    Hip internal rotation    Hip external rotation    Knee flexion 130/135 115/120  Knee extension 0/-1 4/3*  Ankle dorsiflexion    Ankle plantarflexion    Ankle inversion    Ankle eversion     (Blank rows = not tested)  LOWER EXTREMITY MMT:  MMT Right eval Left eval  Hip flexion    Hip extension    Hip abduction    Hip adduction    Hip internal rotation    Hip external rotation    Knee flexion 4+ 4+  Knee extension 4+ 3-  Ankle dorsiflexion    Ankle plantarflexion    Ankle inversion    Ankle eversion      (Blank rows = not tested)   FUNCTIONAL TESTS:  Squat: Posterior knee translation with forward trunk lean   GAIT: Distance walked: 50 ft  Assistive device utilized: Single point cane Level of assistance: Modified independence Comments: Left sided antalgic gait    TODAY'S TREATMENT:                                                                                                                              DATE:   03/14/23: All single leg exercises performed on LLE  Matrix Recumbent seat level 16 for resistance at 3 for 5 min  10 M ambulation with emphasis on heel toe on LLE x 6  Knee AROM  -Flex R/L 115/125  -Ext R/L 4/0 Knee PROM  -Flex R/L 117/127  -Ext R/L 3/0 FOTO: 59 OMEGA Knee Ext #10 3 x 10  Reverse Lunges with 1 UE support 1 x 10  Step Up on 4 inch step with 1 UE 2 x 10  Step Up on 6 inch step with two finger support 1 x 10    03/11/23: All single leg exercises performed on LLE  Matrix Recumbent seat level 17 for 5 min  Step Up on 4 inch step BUE support 1 x 10  Step Up on 4 inch step with 1 UE support 3 x 10   Step Up on 6 inch step with BUE Support 3 x 10  SLS with B UE assist to promote R LE single leg stance strength during gait 10x3 with 5 second holds Heel to toe gait exercise with 1 UE support 20 ft x 8  03/06/23: All single leg exercises performed on LLE  Matrix Recumbent seat level 17 for 5 min  Step Up on 4 inch step BUE support 1 x 10  Step Up on 4 inch step with 1 UE support 1 x 10   Step Up on 6 inch step with BUE Support 1 x 10  Heel to toe gait exercise with 1 UE support 20 ft x 8   02/27/23: All single leg exercises performed on LLE           Matrix Recumbent seat level 13 for 5 min           OMEGA Leg Press #75 1 x 10           OMEGA Leg Press #95 1 x 10           Standing Hip Abduction with BUE support using mat table 3 x 10          Standing Calf Raises with BUE support 1 x 10           Standing Calf Raises with 1 UE support 1 x 10            Standing Calf Raises with 2 finger support 1 x 10           LAQ 1 x 10           Seated HS Stretch 1 x 30 sec           Long Sitting HS and Calf Stretch 2 x 30 sec    02/25/23:          Matrix recumbent bike seat at 16 for 5 min           HS Length: 80/80 deg hip flexion           Ely's Test: Positive Bilaterally           Pone Quad Stretch 2 x 30 sec            Hip MMT:            -Hip Abd R/L 4/4           -Hip Ext R/L 4/4            Sit to Stand with BUE support 3 x 10           -1 UE on seated surface and 1 UE on chair            Partial Lunge with 1 UE support  3 x 10   PATIENT EDUCATION:  Education details: form and technique for correct performance of exercise  Person educated: Patient Education method: Explanation, Demonstration, Verbal cues, and Handouts Education comprehension: verbalized understanding, returned demonstration, and verbal cues required  HOME EXERCISE PROGRAM:  Access Code: [email or web address] URL: [email or web address] Date: 03/14/2023 Prepared by: Ellin Goodie  Exercises - Heel-Toe Walking  - 1 x daily - 10 reps - Seated hamstring and calf stretch   - 1 x daily - 3 reps - 60 sec  hold - Prone Quadriceps Stretch with Strap  - 1 x daily - 3 reps - 30-60 sec hold - Seated Knee Extension Stretch with Chair  - 1 x daily - 2-3 reps - 5- 10 min hold - Standing Hip Abduction with Counter Support  - 3-4 x weekly - 3 sets - 10 reps - Forward Step Up with Counter Support  - 3-4 x weekly - 3 sets - 10 reps  ASSESSMENT:  CLINICAL IMPRESSION: Pt continues to progress towards goals with improvement in knee ROM flexion and self perception of knee function. He continues to be limited with quad strength and knee extension especially with stance limb stability. Pt will continue to benefit from continued skilled PT services to improve strength, function, and ability to ambulate with less difficulty.   OBJECTIVE IMPAIRMENTS: Abnormal gait, difficulty walking,  decreased ROM, decreased strength, hypomobility, increased edema, impaired flexibility, obesity, and pain.   ACTIVITY LIMITATIONS: carrying, lifting, bending, standing, squatting, stairs, and caring for others  PARTICIPATION LIMITATIONS: driving, shopping, community activity, and yard work  PERSONAL FACTORS: 3+ comorbidities: h/o CHF, Afib, HLD, and hypothyroidism  are also affecting patient's functional outcome.   REHAB POTENTIAL: Good  CLINICAL DECISION MAKING: Stable/uncomplicated  EVALUATION COMPLEXITY: Low   GOALS: Goals reviewed with patient? No  SHORT TERM GOALS: Target date: 03/06/2023  Pt will be independent with HEP in order to improve strength and balance in order to decrease fall risk and improve function at home and work. Baseline: Has not attempted performing at home yet 03/14/23 Able to perform independently Goal status: ACHIEVED   2.  Patient will ambulate without use of an assistive device as a sign of improve left knee function.  Baseline: Using SPC  Goal status: ONGOING     LONG TERM GOALS: Target date: 05/01/2023  Patient will have improved function and activity level as evidenced by an increase in FOTO score by 10 points or more.  Baseline: 44/100 with target of 63 03/14/23 59 Goal status: Achieved   2.  Patient will improve left knee AROM to be symmetrical to right knee ROM for improved gait mechanics and gait stability.  Baseline: Knee Ext R/L 0/4  03/14/23: Knee Ext R/L 0/4, Knee Flex R/L 124/117 Goal status: ONGOING   3.  Patient will improve left knee strength to by symmetrical to right knee for improved gait mechanics and gait stability  Baseline: Knee Ext R/L 3-/4+, Hip Flex, Ext, Abd NT  Goal status: ONGOING     PLAN:  PT FREQUENCY: 1-2x/week  PT DURATION: 10 weeks  PLANNED INTERVENTIONS: Therapeutic exercises, Neuromuscular re-education, Balance training, Gait training, Patient/Family education, Joint mobilization, Joint manipulation, Stair  training, DME instructions, Aquatic Therapy, Dry Needling, Cryotherapy, Moist heat, Manual therapy, and Re-evaluation  PLAN FOR NEXT SESSION:   TM to warmup. Assess knee strength. Progress quad strength and mini-squats and OMEGA machine #15 lbs   Ellin Goodie PT, DPT  [facility] [address] [place], [address] Phone: [phone]   Fax:  [phone]

## 2023-03-19 ENCOUNTER — Ambulatory Visit: Payer: Medicare Other | Admitting: Physical Therapy

## 2023-03-19 ENCOUNTER — Telehealth: Payer: Self-pay | Admitting: Internal Medicine

## 2023-03-19 NOTE — Telephone Encounter (Signed)
Pt called stating this morning while walking to fix his coffee, he experienced symptoms of feeling like his heart was racing. He report per his Kardia mobile, it indicated he's in afib with HR of 91. Pt denies SOB but reported feeling a little dizzy. However, he denies feeling like he's going to black out or pass out.  Appointment scheduled for tomorrow 04/20/23 for further evaluations. Pt also made aware of ED precaution should any new symptoms develop or worsen.  Pt verbalized understanding.

## 2023-03-19 NOTE — Telephone Encounter (Signed)
  Patient c/o Palpitations:  STAT if patient reporting lightheadedness, shortness of breath, or chest pain  How long have you had palpitations/irregular HR/ Afib? Are you having the symptoms now? Yes, this morning   Are you currently experiencing lightheadedness, SOB or CP? A little SOB   Do you have a history of afib (atrial fibrillation) or irregular heart rhythm? Yes   Have you checked your BP or HR? (document readings if available): HR 72, BP 125/78  Are you experiencing any other symptoms?   The patient mentioned that he woke up feeling slightly off this morning and checked his machine, which indicated he might be in atrial fibrillation. He noted feeling a bit short of breath but no other symptoms at this time. He would like to know if he needs to see Dr. Okey Dupre regarding this

## 2023-03-20 ENCOUNTER — Other Ambulatory Visit
Admission: RE | Admit: 2023-03-20 | Discharge: 2023-03-20 | Disposition: A | Discharge status: Home / Self Care | Payer: Medicare Other | Attending: Cardiology | Admitting: Cardiology

## 2023-03-20 ENCOUNTER — Encounter: Payer: Self-pay | Admitting: Cardiology

## 2023-03-20 ENCOUNTER — Ambulatory Visit: Payer: Medicare Other | Attending: Cardiology | Admitting: Cardiology

## 2023-03-20 VITALS — BP 138/68 | HR 48 | Ht 72.0 in | Wt 255.0 lb

## 2023-03-20 DIAGNOSIS — Z79899 Other long term (current) drug therapy: Secondary | ICD-10-CM | POA: Insufficient documentation

## 2023-03-20 DIAGNOSIS — D6869 Other thrombophilia: Secondary | ICD-10-CM

## 2023-03-20 DIAGNOSIS — I480 Paroxysmal atrial fibrillation: Secondary | ICD-10-CM | POA: Diagnosis not present

## 2023-03-20 DIAGNOSIS — I110 Hypertensive heart disease with heart failure: Secondary | ICD-10-CM | POA: Diagnosis not present

## 2023-03-20 DIAGNOSIS — E039 Hypothyroidism, unspecified: Secondary | ICD-10-CM

## 2023-03-20 DIAGNOSIS — Z7901 Long term (current) use of anticoagulants: Secondary | ICD-10-CM | POA: Diagnosis not present

## 2023-03-20 DIAGNOSIS — R600 Localized edema: Secondary | ICD-10-CM | POA: Diagnosis not present

## 2023-03-20 DIAGNOSIS — I5032 Chronic diastolic (congestive) heart failure: Secondary | ICD-10-CM | POA: Insufficient documentation

## 2023-03-20 LAB — BASIC METABOLIC PANEL
Anion gap: 7 (ref 5–15)
BUN: 21 mg/dL (ref 8–23)
CO2: 26 mmol/L (ref 22–32)
Calcium: 9.1 mg/dL (ref 8.9–10.3)
Chloride: 108 mmol/L (ref 98–111)
Creatinine, Ser: 1.27 mg/dL — ABNORMAL HIGH (ref 0.61–1.24)
GFR, Estimated: 59 mL/min — ABNORMAL LOW (ref >60)
Glucose, Bld: 69 mg/dL — ABNORMAL LOW (ref 70–99)
Potassium: 4.4 mmol/L (ref 3.5–5.1)
Sodium: 141 mmol/L (ref 135–145)

## 2023-03-20 LAB — BRAIN NATRIURETIC PEPTIDE: B Natriuretic Peptide: 186.5 pg/mL — ABNORMAL HIGH (ref 0.0–100.0)

## 2023-03-20 LAB — CBC
HCT: 41.6 % (ref 39.0–52.0)
Hemoglobin: 13.5 g/dL (ref 13.0–17.0)
MCH: 30.5 pg (ref 26.0–34.0)
MCHC: 32.5 g/dL (ref 30.0–36.0)
MCV: 93.9 fL (ref 80.0–100.0)
Platelets: 162 10*3/uL (ref 150–400)
RBC: 4.43 MIL/uL (ref 4.22–5.81)
RDW: 13.5 % (ref 11.5–15.5)
WBC: 8.1 10*3/uL (ref 4.0–10.5)
nRBC: 0 % (ref 0.0–0.2)

## 2023-03-20 LAB — TSH: TSH: 12.14 u[IU]/mL — ABNORMAL HIGH (ref 0.350–4.500)

## 2023-03-20 NOTE — Patient Instructions (Signed)
Medication Instructions:   Your physician recommends that you continue on your current medications as directed. Please refer to the Current Medication list given to you today.  *If you need a refill on your cardiac medications before your next appointment, please call your pharmacy*   Lab Work:  Your physician recommends that you have lab work: TODAY  CBC/TSH/T4/BMET/BNP  - Please go to the [facility]. You will check in at the front desk to the right as you walk into the atrium. Valet Parking is offered if needed. - No appointment needed. You may go any day between 7 am and 6 pm.  If you have labs (blood work) drawn today and your tests are completely normal, you will receive your results only by: MyChart Message (if you have MyChart) OR A paper copy in the mail If you have any lab test that is abnormal or we need to change your treatment, we will call you to review the results.   Testing/Procedures:  Your physician has requested that you have an echocardiogram. Echocardiography is a painless test that uses sound waves to create images of your heart. It provides your doctor with information about the size and shape of your heart and how well your heart's chambers and valves are working. This procedure takes approximately one hour. There are no restrictions for this procedure. Please do NOT wear cologne, perfume, aftershave, or lotions (deodorant is allowed). Please arrive 15 minutes prior to your appointment time.    Follow-Up: At [facility], you and your health needs are our priority.  As part of our continuing mission to provide you with exceptional heart care, we have created designated Provider Care Teams.  These Care Teams include your primary Cardiologist (physician) and Advanced Practice Providers (APPs -  Physician Assistants and Nurse Practitioners) who all work together to provide you with the care you need, when you need it.  We recommend signing up for  the patient portal called "MyChart".  Sign up information is provided on this After Visit Summary.  MyChart is used to connect with patients for Virtual Visits (Telemedicine).  Patients are able to view lab/test results, encounter notes, upcoming appointments, etc.  Non-urgent messages can be sent to your provider as well.   To learn more about what you can do with MyChart, go to [email or web address].    Your next appointment:   2 month(s)  Provider:   Sherie Don, NP

## 2023-03-20 NOTE — Progress Notes (Signed)
Cardiology Office Note Date:  03/20/2023  Patient ID:  James [name], James [name] 10/06/1945, MRN [id number] PCP:  Joaquim Nam, MD  Cardiologist:  Yvonne Kendall, MD Electrophysiologist: Lanier Prude, MD     Chief Complaint: James [name] follow-up  History of Present Illness: James [name] is a 77 y.o. male with PMH notable for persis AFib, HFpEF, CAD s/p PCI, HTN, hypothyroid; seen today for Lanier Prude, MD for routine electrophysiology followup.  He last saw Dr. Lalla Brothers 12/2021, has seen AFib clinic and Dr. Okey Dupre in interim. He has been on [place] for rhythm mgmt.  He called clinic yesterday with c/o being in afib with palpitations.   On follow-up today, he says that yesterday he felt off, a little out of breath and heart felt lie it was beating hard in his chest. No chest pain, chest pressure, no dizziness or syncope. Patient uses kardia mobile to monitor heart rhythm and it noted that he was in afib yesterday morning. He does not remember his ventricle rate. By lunchtime, the episode had ended and he felt overall well. Historically, he has never had symptoms during afib episodes.   He had knee replacement about a month ago, recovering going well. Has not been as active with the recovery, but is now able to walk more easily. Uses cane as security, does not really need it for ambulating.  He diligently takes eliquis and tikosyn BID. No missed doses. No bleeding concerns.  He has some edema today, L>R. Eats at fast food a few times a week, but tries to limit sodium in diet. He denies s/s of OSA. He does drink ETOH, 1-2 glasses of wine / night. No tobacco use. No sick contacts. Currently,  he denies chest pain, palpitations, dyspnea, PND, orthopnea, nausea, vomiting, dizziness, syncope, weight gain, or early satiety.     AAD History: Amiodarone, stopped to limit off-target effects Tikosyn, started 11/2021  Past Medical History:  Diagnosis Date   (HFpEF) heart failure with preserved  ejection fraction (HCC)    a. 05/2018 Echo: EF 55-60%, gr2 DD.   Acute lower GI bleeding after colonoscopy and polypectomy, resolved 12/12/2015   Ascending aortic aneurysm (HCC)    a. 02/2018 Echo: mildly dil Ao root/asc ao/arch; b. 05/2018 CTA Chest: 4.7cm fusiform aneurysm of Asc Ao.   CAD (coronary artery disease)    a. 05/2018 Cath/PCI: LM nl,LAD 30p, 90/99d/apical, LCX 31m, OM1/2/3 min irregs, RCA 85p (3.5x18 [place] DES).   Cardiac murmur    a. 02/2018 Echo: EF 60-65%, no rwma, mild AI, mildly dil Ao root/Asc Ao/Arch, Mild MR, mildly dil LA. Nl RV fxn.   Cataract    bilateral surgery to remove   CHF (congestive heart failure) (HCC)    Colon polyp    Constipation    Dysrhythmia    Hx A. Fib   Essential hypertension    Hemorrhoids    Hepatitis    self resolved, likely food exposure, 1974.    History of cardiovascular stress test    a. 02/2018 Myoview: EF 55-65%, small/mild apical defect w/ nl wall motion ->attenuation artifact. No ischemia. Low risk study.   Hx of adenomatous colonic polyps 12/05/2015   Hyperlipidemia    Hypothyroidism    Mitral regurgitation    a. 110/2019 Echo: EF 55-60%. Gr2 DD. Triv AI. Ao root 38mm. Mild MR. Mod dil LA, mildly dil RA.   Osteoarthritis    Persistent atrial fibrillation (HCC)    a. Dx 02/2018. CHA2DS2VASc = 2-->Eliquis;  b. 03/2018 DCCV (200J); c. 03/2018 recurrent Afib-->flecainide started 04/2018;  d. 05/06/2018 s/p successful DCCV - 200J x 2-->on amio.   Skin cancer    basal cell, R ear, MOHS    Past Surgical History:  Procedure Laterality Date   BROW LIFT Bilateral 10/23/2016   Procedure: BLEPHAROPLASTY upper eyelid with excess skin;  Surgeon: Imagene Riches, MD;  Location: [facility];  Service: Ophthalmology;  Laterality: Bilateral;  MAC   CARDIOVERSION N/A 03/18/2018   Procedure: CARDIOVERSION;  Surgeon: Yvonne Kendall, MD;  Location: [facility];  Service: Cardiovascular;  Laterality: N/A;   CARDIOVERSION N/A 05/06/2018   Procedure:  CARDIOVERSION;  Surgeon: Yvonne Kendall, MD;  Location: [facility];  Service: Cardiovascular;  Laterality: N/A;   CATARACT EXTRACTION  1986   OD   CATARACT EXTRACTION Bilateral 1995   x 2 for right and left    COLONOSCOPY     CORONARY STENT INTERVENTION N/A 05/13/2018   Procedure: CORONARY STENT INTERVENTION;  Surgeon: Yvonne Kendall, MD;  Location: [facility];  Service: Cardiovascular;  Laterality: N/A;   ECTROPION REPAIR Bilateral 10/23/2016   Procedure: REPAIR OF ECTROPION sutures, extensive;  Surgeon: Imagene Riches, MD;  Location: [facility];  Service: Ophthalmology;  Laterality: Bilateral;   LIPOMA EXCISION  06/1997   left neck ([place])   MOHS SURGERY Right 2013   behind right ear   RIGHT/LEFT HEART CATH AND CORONARY ANGIOGRAPHY N/A 05/12/2018   Procedure: RIGHT/LEFT HEART CATH AND CORONARY ANGIOGRAPHY;  Surgeon: Iran Ouch, MD;  Location: [facility];  Service: Cardiovascular;  Laterality: N/A;   TONSILLECTOMY     TOTAL KNEE ARTHROPLASTY Left 01/17/2023   Procedure: LEFT TOTAL KNEE ARTHROPLASTY;  Surgeon: Cammy Copa, MD;  Location: [facility];  Service: Orthopedics;  Laterality: Left;   VASECTOMY  1982    Current Outpatient Medications  Medication Instructions   acetaminophen (TYLENOL) 1,000 mg, Oral, Every 6 hours PRN   amoxicillin (AMOXIL) 500 MG tablet 2g po 1 hr prior to dental procedure.   apixaban (ELIQUIS) 5 mg, Oral, 2 times daily   atorvastatin (LIPITOR) 80 MG tablet TAKE 1 TABLET BY MOUTH DAILY AT 6PM   carboxymethylcellulose (REFRESH PLUS) 0.5 % SOLN 1-2 drops, Both Eyes, 3 times daily PRN   diclofenac Sodium (VOLTAREN) 2 g, Topical, 4 times daily PRN   dofetilide (TIKOSYN) 500 mcg, Oral, 2 times daily   doxazosin (CARDURA) 4 MG tablet TAKE 1 TABLET BY MOUTH DAILY AFTER BREAKFAST   ezetimibe (ZETIA) 10 MG tablet TAKE 1 TABLET BY MOUTH DAILY   finasteride (PROSCAR) 5 MG tablet TAKE 1 TABLET BY MOUTH EVERY DAY   furosemide (LASIX)  20 MG tablet TAKE 1 TABLET BY MOUTH EVERY OTHER DAY, GENERIC EQUIVALENT FOR LASIX   levothyroxine (SYNTHROID) 125 mcg, Oral, Daily before breakfast, Except skip Sundays.  6 pill per week.   methocarbamol (ROBAXIN) 500 mg, Oral, Every 8 hours PRN   metoprolol succinate (TOPROL XL) 25 mg, Oral, Daily   Multiple Vitamin (MULTIVITAMIN WITH MINERALS) TABS tablet 1 tablet, Oral, Daily, Senior Multivitamin    psyllium (METAMUCIL) 58.6 % packet 1 packet, Oral, Daily PRN   ramipril (ALTACE) 10 MG capsule TAKE ONE CAPSULE BY MOUTH DAILY. GENERIC EQUIVALENT FOR ALTACE    Social History:  The patient  reports that he quit smoking about 39 years ago. His smoking use included cigarettes. He started smoking about 59 years ago. He has a 40 pack-year smoking history. He has been exposed  to tobacco smoke. He has never used smokeless tobacco. He reports current alcohol use. He reports that he does not use drugs.   Family History:  The patient's family history includes Heart disease in his paternal grandfather; Lung cancer in his maternal grandfather; Stroke in his maternal grandfather and mother.  ROS:  Please see the history of present illness. All other systems are reviewed and otherwise negative.   PHYSICAL EXAM:  VS:  BP 138/68 (BP Location: Left Arm, Patient Position: Sitting, Cuff Size: Normal)   Pulse (!) 48   Ht 6' (1.829 m)   Wt 255 lb (115.7 kg)   BMI 34.58 kg/m  BMI: There is no height or weight on file to calculate BMI.  GEN- The patient is well appearing, alert and oriented x 3 today.   Lungs- Clear to ausculation bilaterally, normal work of breathing.  Heart- Regular, bradycardic rate and rhythm, no murmurs, rubs or gallops Extremities- 2-3+ peripheral edema, warm, dry   EKG is ordered. Personal review of EKG from today shows:    EKG Interpretation Date/Time:  Wednesday March 20 2023 15:05:09 EDT Ventricular Rate:  48 PR Interval:  200 QRS Duration:  94 QT Interval:  476 QTC  Calculation: 425 R Axis:   -38  Text Interpretation: Sinus bradycardia Left axis deviation Confirmed by Sherie Don [phone]) on 03/20/2023 3:22:51 PM    01/18/2023 EKG - NSR 80, LAD; QTCb 478  Recent Labs: 08/23/2022: TSH 1.79 01/18/2023: Magnesium 2.2 01/20/2023: B Natriuretic Peptide 55.3 01/29/2023: ALT 24; BUN 19; Creatinine, Ser 1.33; Hemoglobin 10.5; Platelets 286.0; Potassium 4.3; Sodium 138  05/25/2022: Cholesterol 121; HDL 41; LDL Cholesterol 66; Total CHOL/HDL Ratio 3.0; Triglycerides 70; VLDL 14   CrCl cannot be calculated (Patient's most recent lab result is older than the maximum 21 days allowed.).   Wt Readings from Last 3 Encounters:  01/29/23 260 lb (117.9 kg)  01/20/23 250 lb (113.4 kg)  01/19/23 250 lb (113.4 kg)     Additional studies reviewed include: Previous EP, cardiology notes.   NM myocardial spect, 09/09/2020 Pharmacological myocardial perfusion imaging study with no significant ischemia Small fixed apical defect consistent with prior MI, though attenuation artifact cannot be excluded. Normal wall motion, EF estimated at 57% No EKG changes concerning for ischemia at peak stress or in recovery. CT attenuation correction images with mild aortic atherosclerosis, moderate three-vessel coronary calcification Low risk scan  TTE, 06/19/2019  1. Left ventricular ejection fraction, by visual estimation, is 55 to 60%. The left ventricle has normal function. There is no left ventricular hypertrophy.   2. Global right ventricle has normal systolic function.The right ventricular size is normal. No increase in right ventricular wall thickness.   3. Left atrial size was mild-moderately dilated.   4. Right atrial size was normal.   5. Mild to moderate mitral valve regurgitation.   6. There is mild dilatation of the aortic root measuring 38 mm. Aortic arch 3.8 cm   7. Mildly elevated pulmonary artery systolic pressure.   ASSESSMENT AND PLAN:  #) parox Afib #)  hypothyroid EKG with stable intervals on tikosyn BID, continue Has had recent symptomatic episode, first one ever Update Bmet, thyroid labs today  #) Hypercoag d/t afib CHA2DS2-VASc Score = 5 [CHF History: 1, HTN History: 1, Diabetes History: 0, Stroke History: 0, Vascular Disease History: 1, Age Score: 2, Gender Score: 0].  Therefore, the patient's annual risk of stroke is 7.2 %.    Stroke ppx - 5mg  eliquis BID, appropriately  dosed No bleeding concerns - update cbc  #) HFpEF #) HTN Most recent echo 06/2019 with preserved EF Patient with increased lower extremity edema today on exam CTA with exercise limited by recent knee surgery BNP today Update echo Recommended lifestyle modifications - limiting restaurant food, review nutrition labs for sodium content, increase physical activity as able      Current medicines are reviewed at length with the patient today.   The patient does not have concerns regarding his medicines.  The following changes were made today:  none  Labs/ tests ordered today include:  Orders Placed This Encounter  Procedures   Basic metabolic panel   Brain natriuretic peptide   CBC   TSH   T4   EKG 12-Lead   ECHOCARDIOGRAM COMPLETE     Disposition: Follow up with EP APP  2 months    Signed, Sherie Don, NP  03/20/23  8:47 AM  Electrophysiology [facility]

## 2023-03-21 ENCOUNTER — Ambulatory Visit: Payer: Medicare Other | Admitting: Physical Therapy

## 2023-03-21 ENCOUNTER — Telehealth: Payer: Self-pay | Admitting: Cardiology

## 2023-03-21 ENCOUNTER — Encounter: Payer: Self-pay | Admitting: Physical Therapy

## 2023-03-21 ENCOUNTER — Other Ambulatory Visit: Payer: Self-pay

## 2023-03-21 ENCOUNTER — Encounter: Payer: Self-pay | Admitting: Family Medicine

## 2023-03-21 DIAGNOSIS — M25562 Pain in left knee: Secondary | ICD-10-CM

## 2023-03-21 DIAGNOSIS — E039 Hypothyroidism, unspecified: Secondary | ICD-10-CM

## 2023-03-21 MED ORDER — LEVOTHYROXINE SODIUM 150 MCG PO TABS
150.0000 ug | ORAL_TABLET | Freq: Every day | ORAL | 1 refills | Status: DC
Start: 2023-03-21 — End: 2023-03-22

## 2023-03-21 NOTE — Therapy (Signed)
OUTPATIENT PHYSICAL THERAPY LOWER EXTREMITY TREATMENT    Patient Name: James [name] MRN: [id number] DOB:1945/08/20, 77 y.o., male Today's Date: 03/21/2023  END OF SESSION:  PT End of Session - 03/21/23 0910     Visit Number 7    Number of Visits 20    Date for PT Re-Evaluation 05/01/23    Authorization Type [id number]    Authorization - Visit Number 7    Authorization - Number of Visits 20    Progress Note Due on Visit 10    PT Start Time 0905    PT Stop Time 0945    PT Time Calculation (min) 40 min    Activity Tolerance Patient tolerated treatment well    Behavior During Therapy [facility] for tasks assessed/performed               Past Medical History:  Diagnosis Date   (HFpEF) heart failure with preserved ejection fraction (HCC)    a. 05/2018 Echo: EF 55-60%, gr2 DD.   Acute lower GI bleeding after colonoscopy and polypectomy, resolved 12/12/2015   Ascending aortic aneurysm (HCC)    a. 02/2018 Echo: mildly dil Ao root/asc ao/arch; b. 05/2018 CTA Chest: 4.7cm fusiform aneurysm of Asc Ao.   CAD (coronary artery disease)    a. 05/2018 Cath/PCI: LM nl,LAD 30p, 90/99d/apical, LCX 36m, OM1/2/3 min irregs, RCA 85p (3.5x18 [place] DES).   Cardiac murmur    a. 02/2018 Echo: EF 60-65%, no rwma, mild AI, mildly dil Ao root/Asc Ao/Arch, Mild MR, mildly dil LA. Nl RV fxn.   Cataract    bilateral surgery to remove   CHF (congestive heart failure) (HCC)    Colon polyp    Constipation    Dysrhythmia    Hx A. Fib   Essential hypertension    Hemorrhoids    Hepatitis    self resolved, likely food exposure, 1974.    History of cardiovascular stress test    a. 02/2018 Myoview: EF 55-65%, small/mild apical defect w/ nl wall motion ->attenuation artifact. No ischemia. Low risk study.   Hx of adenomatous colonic polyps 12/05/2015   Hyperlipidemia    Hypothyroidism    Mitral regurgitation    a. 110/2019 Echo: EF 55-60%. Gr2 DD. Triv AI. Ao root 38mm. Mild MR. Mod dil LA, mildly dil RA.    Osteoarthritis    Persistent atrial fibrillation (HCC)    a. Dx 02/2018. CHA2DS2VASc = 2-->Eliquis; b. 03/2018 DCCV (200J); c. 03/2018 recurrent Afib-->flecainide started 04/2018;  d. 05/06/2018 s/p successful DCCV - 200J x 2-->on amio.   Skin cancer    basal cell, R ear, MOHS   Past Surgical History:  Procedure Laterality Date   BROW LIFT Bilateral 10/23/2016   Procedure: BLEPHAROPLASTY upper eyelid with excess skin;  Surgeon: Imagene Riches, MD;  Location: [facility];  Service: Ophthalmology;  Laterality: Bilateral;  MAC   CARDIOVERSION N/A 03/18/2018   Procedure: CARDIOVERSION;  Surgeon: Yvonne Kendall, MD;  Location: [facility] OR[facility];  Service: Cardiovascular;  Laterality: N/A;   CARDIOVERSION N/A 05/06/2018   Procedure: CARDIOVERSION;  Surgeon: Yvonne Kendall, MD;  Location: [facility];  Service: Cardiovascular;  Laterality: N/A;   CATARACT EXTRACTION  1986   OD   CATARACT EXTRACTION Bilateral 1995   x 2 for right and left    COLONOSCOPY     CORONARY STENT INTERVENTION N/A 05/13/2018   Procedure: CORONARY STENT INTERVENTION;  Surgeon: Yvonne Kendall, MD;  Location: [facility];  Service: Cardiovascular;  Laterality: N/A;  ECTROPION REPAIR Bilateral 10/23/2016   Procedure: REPAIR OF ECTROPION sutures, extensive;  Surgeon: Imagene Riches, MD;  Location: [facility];  Service: Ophthalmology;  Laterality: Bilateral;   LIPOMA EXCISION  06/1997   left neck (Juengel)   MOHS SURGERY Right 2013   behind right ear   RIGHT/LEFT HEART CATH AND CORONARY ANGIOGRAPHY N/A 05/12/2018   Procedure: RIGHT/LEFT HEART CATH AND CORONARY ANGIOGRAPHY;  Surgeon: Iran Ouch, MD;  Location: [facility];  Service: Cardiovascular;  Laterality: N/A;   TONSILLECTOMY     TOTAL KNEE ARTHROPLASTY Left 01/17/2023   Procedure: LEFT TOTAL KNEE ARTHROPLASTY;  Surgeon: Cammy Copa, MD;  Location: [facility] OR;  Service: Orthopedics;  Laterality: Left;   VASECTOMY  1982   Patient Active Problem  List   Diagnosis Date Noted   Arthritis of left knee 02/03/2023   Anemia 01/30/2023   Dehydration 01/21/2023   Near syncope 01/20/2023   AKI (acute kidney injury) (HCC) 01/20/2023   Septic joint of left knee joint (HCC) 01/20/2023   Elevated lactic acid level 01/20/2023   Severe sepsis (HCC) 01/20/2023   OA (osteoarthritis) of knee 01/17/2023   S/P total knee replacement, left 01/17/2023   Pruritus 03/21/2022   Afib (HCC) 11/07/2021   Hyperglycemia 08/27/2021   Dysphagia 08/27/2021   Thoracic aortic aneurysm without rupture (HCC) 11/25/2019   Chronic heart failure with preserved ejection fraction (HFpEF) (HCC) 08/18/2018   Hyperlipidemia LDL goal <70 08/18/2018   Coronary artery disease involving native coronary artery of native heart without angina pectoris 05/16/2018   Unstable angina (HCC)    CHF (congestive heart failure) (HCC) 05/09/2018   Benign prostatic hyperplasia with nocturia 05/07/2018   Dyspnea on exertion 02/19/2018   Persistent atrial fibrillation (HCC) 02/12/2018   Cardiac murmur 02/12/2018   Pain and swelling of left knee 08/07/2017   Healthcare maintenance 05/01/2016   Hematuria 03/06/2016   Hx of adenomatous colonic polyps 12/05/2015   Advance care planning 07/21/2014   Medicare annual wellness visit, subsequent 07/01/2012   External hemorrhoids 06/27/2011   Hypothyroidism 02/24/2008   HYPERCHOLESTEROLEMIA 02/21/2007   Essential hypertension 02/21/2007   DEGENERATIVE JOINT DISEASE, CERVICAL SPINE 02/21/2007    PCP: Dr. Crawford Givens   REFERRING PROVIDER: Harriette Bouillon PA-C  REFERRING DIAG: Left TKA   THERAPY DIAG:  Left knee pain, unspecified chronicity  Rationale for Evaluation and Treatment: Rehabilitation  ONSET DATE: 01/17/23  SUBJECTIVE:   SUBJECTIVE STATEMENT: Pt reports having issue with Afib and he carries cane but does not use it often.   PERTINENT HISTORY: Pt reports that he just finished HHPT and he reports that it went well.  His pain is well controlled and he is currently only taking two tylenols at the beginning of the day.   PAIN:  Are you having pain? No  PRECAUTIONS: None  RED FLAGS: None   WEIGHT BEARING RESTRICTIONS: No  FALLS:  Has patient fallen in last 6 months? No  LIVING ENVIRONMENT: Lives with: lives with their spouse Lives in: House/apartment Stairs: No Has following equipment at home: Single point cane, Environmental consultant - 2 wheeled, and shower chair  OCCUPATION: Retired   PLOF: Independent  PATIENT GOALS: Return to walking without a single point cane and hiking   NEXT MD VISIT: 02/27/23  OBJECTIVE:   VITALS: BP 119/75 HR 78 SpO2 100   DIAGNOSTIC FINDINGS: AP and lateral views of the knee reviewed.  Total knee arthroplasty  prosthesis in good position and alignment without any complicating  features.  There is no evidence of dislocation, periprosthetic fracture,  patella baja/alta   PATIENT SURVEYS:  FOTO 44/100   COGNITION: Overall cognitive status: Within functional limits for tasks assessed     SENSATION: WFL  EDEMA:  Circumferential: R/L 16"/18"  MUSCLE LENGTH: Hamstrings: Right NT deg; Left NT deg Maisie Fus test: Right NT deg; Left NT  deg  POSTURE: rounded shoulders  PALPATION: Anterior surface of left knee   LOWER EXTREMITY ROM:  Active/Passive ROM Right eval Left eval  Hip flexion    Hip extension    Hip abduction    Hip adduction    Hip internal rotation    Hip external rotation    Knee flexion 130/135 115/120  Knee extension 0/-1 4/3*  Ankle dorsiflexion    Ankle plantarflexion    Ankle inversion    Ankle eversion     (Blank rows = not tested)  LOWER EXTREMITY MMT:  MMT Right eval Left eval  Hip flexion    Hip extension    Hip abduction    Hip adduction    Hip internal rotation    Hip external rotation    Knee flexion 4+ 4+  Knee extension 4+ 3-  Ankle dorsiflexion    Ankle plantarflexion    Ankle inversion    Ankle eversion     (Blank  rows = not tested)   FUNCTIONAL TESTS:  Squat: Posterior knee translation with forward trunk lean   GAIT: Distance walked: 50 ft  Assistive device utilized: Single point cane Level of assistance: Modified independence Comments: Left sided antalgic gait    TODAY'S TREATMENT:                                                                                                                              DATE:   03/21/23: All single leg exercises performed on LLE  TM 1.0 mph for 5 min Heel to toe 10 m x 6  -min VC for increased heel strike and toe off    OMEGA Leg Press #25 on LLE 1 x 10 OMEGA Leg Press #45 on LLE 2 x 10   Step Up with 6 inch step with BUE support 1 x 10  Step Up with 6 inch step with 1 UE support 1 x 10  Step Up with 6 inch step with no UE support 1 x 10 -intermittent use of 1 UE  Forward Step Up on 4 inch step 1 x 10  Side Step Up on 4 inch step with 1 UE support 2 x 10  -min VC to step up with LLE and step down with RLE    03/14/23: All single leg exercises performed on LLE  Matrix Recumbent seat level 16 for resistance at 3 for 5 min  10 M ambulation with emphasis on heel toe on LLE x 6  Knee AROM  -Flex R/L 115/125  -Ext R/L 4/0 Knee PROM  -Flex R/L 117/127  -Ext R/L 3/0  FOTO: 59 OMEGA Knee Ext #10 3 x 10  Reverse Lunges with 1 UE support 1 x 10  Step Up on 4 inch step with 1 UE 2 x 10  Step Up on 6 inch step with two finger support 1 x 10    03/11/23: All single leg exercises performed on LLE  Matrix Recumbent seat level 17 for 5 min  Step Up on 4 inch step BUE support 1 x 10  Step Up on 4 inch step with 1 UE support 3 x 10   Step Up on 6 inch step with BUE Support 3 x 10  SLS with B UE assist to promote R LE single leg stance strength during gait 10x3 with 5 second holds Heel to toe gait exercise with 1 UE support 20 ft x 8    03/06/23: All single leg exercises performed on LLE  Matrix Recumbent seat level 17 for 5 min  Step Up on 4 inch step BUE  support 1 x 10  Step Up on 4 inch step with 1 UE support 1 x 10   Step Up on 6 inch step with BUE Support 1 x 10  Heel to toe gait exercise with 1 UE support 20 ft x 8   PATIENT EDUCATION:  Education details: form and technique for correct performance of exercise  Person educated: Patient Education method: Explanation, Demonstration, Verbal cues, and Handouts Education comprehension: verbalized understanding, returned demonstration, and verbal cues required  HOME EXERCISE PROGRAM:  Access Code: [email or web address] URL: [email or web address] Date: 03/14/2023 Prepared by: Ellin Goodie  Exercises - Heel-Toe Walking  - 1 x daily - 10 reps - Seated hamstring and calf stretch   - 1 x daily - 3 reps - 60 sec  hold - Prone Quadriceps Stretch with Strap  - 1 x daily - 3 reps - 30-60 sec hold - Seated Knee Extension Stretch with Chair  - 1 x daily - 2-3 reps - 5- 10 min hold - Standing Hip Abduction with Counter Support  - 3-4 x weekly - 3 sets - 10 reps - Forward Step Up with Counter Support  - 3-4 x weekly - 3 sets - 10 reps  ASSESSMENT:  CLINICAL IMPRESSION: Continues to progress towards goals with left knee strengthening and knee stability with single leg exercises on OMEGA machine and improved heel contact and toe off. Pt still demonstrates decreased knee extension in stance phase with intermittent knee instability.  He will continue to benefit from continued skilled PT services to improve strength, function, and ability to ambulate with less difficulty.  OBJECTIVE IMPAIRMENTS: Abnormal gait, difficulty walking, decreased ROM, decreased strength, hypomobility, increased edema, impaired flexibility, obesity, and pain.   ACTIVITY LIMITATIONS: carrying, lifting, bending, standing, squatting, stairs, and caring for others  PARTICIPATION LIMITATIONS: driving, shopping, community activity, and yard work  PERSONAL FACTORS: 3+ comorbidities: h/o CHF, Afib, HLD, and hypothyroidism  are  also affecting patient's functional outcome.   REHAB POTENTIAL: Good  CLINICAL DECISION MAKING: Stable/uncomplicated  EVALUATION COMPLEXITY: Low   GOALS: Goals reviewed with patient? No  SHORT TERM GOALS: Target date: 03/06/2023  Pt will be independent with HEP in order to improve strength and balance in order to decrease fall risk and improve function at home and work. Baseline: Has not attempted performing at home yet 03/14/23 Able to perform independently Goal status: ACHIEVED   2.  Patient will ambulate without use of an assistive device as a sign of improve left knee function.  Baseline: Using [facility]  Goal status: ONGOING     LONG TERM GOALS: Target date: 05/01/2023  Patient will have improved function and activity level as evidenced by an increase in FOTO score by 10 points or more.  Baseline: 44/100 with target of 63 03/14/23 59 Goal status: Achieved   2.  Patient will improve left knee AROM to be symmetrical to right knee ROM for improved gait mechanics and gait stability.  Baseline: Knee Ext R/L 0/4  03/14/23: Knee Ext R/L 0/4, Knee Flex R/L 124/117 Goal status: ONGOING   3.  Patient will improve left knee strength to by symmetrical to right knee for improved gait mechanics and gait stability  Baseline: Knee Ext R/L 3-/4+, Hip Flex, Ext, Abd NT  Goal status: ONGOING     PLAN:  PT FREQUENCY: 1-2x/week  PT DURATION: 10 weeks  PLANNED INTERVENTIONS: Therapeutic exercises, Neuromuscular re-education, Balance training, Gait training, Patient/Family education, Joint mobilization, Joint manipulation, Stair training, DME instructions, Aquatic Therapy, Dry Needling, Cryotherapy, Moist heat, Manual therapy, and Re-evaluation  PLAN FOR NEXT SESSION:   TM to warmup. Assess knee strength. Mini-Squats. OMEGA Knee Extension   Ellin Goodie PT, DPT  [facility] [address], [place], [address] Phone: [phone]   Fax:   [phone]

## 2023-03-21 NOTE — Telephone Encounter (Signed)
Patient is requesting to speak with RN Raynelle Fanning in regards to the change in medication. Please advise.

## 2023-03-21 NOTE — Telephone Encounter (Signed)
Called patient, he just questioned if he should change how he was taking synthroid, advised we were just increasing dosage for now until he can get in with PCP to evaluate for further changes. Patient verbalized understanding, all questions were answered.

## 2023-03-22 ENCOUNTER — Telehealth: Payer: Self-pay | Admitting: Internal Medicine

## 2023-03-22 ENCOUNTER — Other Ambulatory Visit: Payer: Self-pay

## 2023-03-22 DIAGNOSIS — E039 Hypothyroidism, unspecified: Secondary | ICD-10-CM

## 2023-03-22 LAB — T4: T4, Total: 7.4 ug/dL (ref 4.5–12.0)

## 2023-03-22 MED ORDER — LEVOTHYROXINE SODIUM 150 MCG PO TABS
150.0000 ug | ORAL_TABLET | Freq: Every day | ORAL | 3 refills | Status: DC
Start: 2023-03-22 — End: 2024-04-19

## 2023-03-22 NOTE — Telephone Encounter (Signed)
*  STAT* If patient is at the pharmacy, call can be transferred to refill team.   1. Which medications need to be refilled? (please list name of each medication and dose if known) levothyroxine (SYNTHROID) 150 MCG tablet  2. Which pharmacy/location (including street and city if local pharmacy) is medication to be sent to? [id number] - [place], [place] - [address] OF [address]  3. Do they need a 30 day or 90 day supply?  30 day supply  Prescription never went through to pharmacy.

## 2023-03-22 NOTE — Telephone Encounter (Signed)
Yes thank you! RX didn't go to the pharmacy, I resent it in back in.   Thanks!

## 2023-03-22 NOTE — Telephone Encounter (Signed)
Please advise If ok to refill new Rx. Last Rx was printed and never sent to pharmacy just want to make sure that was an error and ok to refill.

## 2023-03-24 ENCOUNTER — Other Ambulatory Visit: Payer: Self-pay | Admitting: Family Medicine

## 2023-03-24 DIAGNOSIS — E039 Hypothyroidism, unspecified: Secondary | ICD-10-CM

## 2023-03-26 ENCOUNTER — Ambulatory Visit: Payer: Medicare Other | Admitting: Physical Therapy

## 2023-03-26 DIAGNOSIS — M25562 Pain in left knee: Secondary | ICD-10-CM

## 2023-03-26 NOTE — Therapy (Signed)
OUTPATIENT PHYSICAL THERAPY LOWER EXTREMITY TREATMENT    Patient Name: James [name] MRN: [id number] DOB:June 01, 1946, 77 y.o., male Today's Date: 03/26/2023  END OF SESSION:  PT End of Session - 03/26/23 1123     Visit Number 8    Number of Visits 20    Date for PT Re-Evaluation 05/01/23    Authorization Type [id number]    Authorization - Visit Number 8    Authorization - Number of Visits 20    Progress Note Due on Visit 10    PT Start Time 1120    PT Stop Time 1200    PT Time Calculation (min) 40 min    Activity Tolerance Patient tolerated treatment well    Behavior During Therapy WFL for tasks assessed/performed                Past Medical History:  Diagnosis Date   (HFpEF) heart failure with preserved ejection fraction (HCC)    a. 05/2018 Echo: EF 55-60%, gr2 DD.   Acute lower GI bleeding after colonoscopy and polypectomy, resolved 12/12/2015   Ascending aortic aneurysm (HCC)    a. 02/2018 Echo: mildly dil Ao root/asc ao/arch; b. 05/2018 CTA Chest: 4.7cm fusiform aneurysm of Asc Ao.   CAD (coronary artery disease)    a. 05/2018 Cath/PCI: LM nl,LAD 30p, 90/99d/apical, LCX 53m, OM1/2/3 min irregs, RCA 85p (3.5x18 [place] DES).   Cardiac murmur    a. 02/2018 Echo: EF 60-65%, no rwma, mild AI, mildly dil Ao root/Asc Ao/Arch, Mild MR, mildly dil LA. Nl RV fxn.   Cataract    bilateral surgery to remove   CHF (congestive heart failure) (HCC)    Colon polyp    Constipation    Dysrhythmia    Hx A. Fib   Essential hypertension    Hemorrhoids    Hepatitis    self resolved, likely food exposure, 1974.    History of cardiovascular stress test    a. 02/2018 Myoview: EF 55-65%, small/mild apical defect w/ nl wall motion ->attenuation artifact. No ischemia. Low risk study.   Hx of adenomatous colonic polyps 12/05/2015   Hyperlipidemia    Hypothyroidism    Mitral regurgitation    a. 110/2019 Echo: EF 55-60%. Gr2 DD. Triv AI. Ao root 38mm. Mild MR. Mod dil LA, mildly dil RA.    Osteoarthritis    Persistent atrial fibrillation (HCC)    a. Dx 02/2018. CHA2DS2VASc = 2-->Eliquis; b. 03/2018 DCCV (200J); c. 03/2018 recurrent Afib-->flecainide started 04/2018;  d. 05/06/2018 s/p successful DCCV - 200J x 2-->on amio.   Skin cancer    basal cell, R ear, MOHS   Past Surgical History:  Procedure Laterality Date   BROW LIFT Bilateral 10/23/2016   Procedure: BLEPHAROPLASTY upper eyelid with excess skin;  Surgeon: Imagene Riches, MD;  Location: [facility];  Service: Ophthalmology;  Laterality: Bilateral;  MAC   CARDIOVERSION N/A 03/18/2018   Procedure: CARDIOVERSION;  Surgeon: Yvonne Kendall, MD;  Location: [facility];  Service: Cardiovascular;  Laterality: N/A;   CARDIOVERSION N/A 05/06/2018   Procedure: CARDIOVERSION;  Surgeon: Yvonne Kendall, MD;  Location: [facility];  Service: Cardiovascular;  Laterality: N/A;   CATARACT EXTRACTION  1986   OD   CATARACT EXTRACTION Bilateral 1995   x 2 for right and left    COLONOSCOPY     CORONARY STENT INTERVENTION N/A 05/13/2018   Procedure: CORONARY STENT INTERVENTION;  Surgeon: Yvonne Kendall, MD;  Location: [facility];  Service: Cardiovascular;  Laterality: N/A;  ECTROPION REPAIR Bilateral 10/23/2016   Procedure: REPAIR OF ECTROPION sutures, extensive;  Surgeon: Imagene Riches, MD;  Location: [facility];  Service: Ophthalmology;  Laterality: Bilateral;   LIPOMA EXCISION  06/1997   left neck ([place]el)   MOHS SURGERY Right 2013   behind right ear   RIGHT/LEFT HEART CATH AND CORONARY ANGIOGRAPHY N/A 05/12/2018   Procedure: RIGHT/LEFT HEART CATH AND CORONARY ANGIOGRAPHY;  Surgeon: Iran Ouch, MD;  Location: [facility];  Service: Cardiovascular;  Laterality: N/A;   TONSILLECTOMY     TOTAL KNEE ARTHROPLASTY Left 01/17/2023   Procedure: LEFT TOTAL KNEE ARTHROPLASTY;  Surgeon: Cammy Copa, MD;  Location: [facility];  Service: Orthopedics;  Laterality: Left;   VASECTOMY  1982   Patient Active  Problem List   Diagnosis Date Noted   Arthritis of left knee 02/03/2023   Anemia 01/30/2023   Dehydration 01/21/2023   Near syncope 01/20/2023   AKI (acute kidney injury) (HCC) 01/20/2023   Septic joint of left knee joint (HCC) 01/20/2023   Elevated lactic acid level 01/20/2023   Severe sepsis (HCC) 01/20/2023   OA (osteoarthritis) of knee 01/17/2023   S/P total knee replacement, left 01/17/2023   Pruritus 03/21/2022   Afib (HCC) 11/07/2021   Hyperglycemia 08/27/2021   Dysphagia 08/27/2021   Thoracic aortic aneurysm without rupture (HCC) 11/25/2019   Chronic heart failure with preserved ejection fraction (HFpEF) (HCC) 08/18/2018   Hyperlipidemia LDL goal <70 08/18/2018   Coronary artery disease involving native coronary artery of native heart without angina pectoris 05/16/2018   Unstable angina (HCC)    CHF (congestive heart failure) (HCC) 05/09/2018   Benign prostatic hyperplasia with nocturia 05/07/2018   Dyspnea on exertion 02/19/2018   Persistent atrial fibrillation (HCC) 02/12/2018   Cardiac murmur 02/12/2018   Pain and swelling of left knee 08/07/2017   Healthcare maintenance 05/01/2016   Hematuria 03/06/2016   Hx of adenomatous colonic polyps 12/05/2015   Advance care planning 07/21/2014   Medicare annual wellness visit, subsequent 07/01/2012   External hemorrhoids 06/27/2011   Hypothyroidism 02/24/2008   HYPERCHOLESTEROLEMIA 02/21/2007   Essential hypertension 02/21/2007   DEGENERATIVE JOINT DISEASE, CERVICAL SPINE 02/21/2007    PCP: Dr. Crawford Givens   REFERRING PROVIDER: Harriette Bouillon PA-C  REFERRING DIAG: Left TKA   THERAPY DIAG:  Left knee pain, unspecified chronicity  Rationale for Evaluation and Treatment: Rehabilitation  ONSET DATE: 01/17/23  SUBJECTIVE:   SUBJECTIVE STATEMENT: Pt reports improvement in walking without cane and that he is able to walk for longer distances. He did report feeling increased lateral hamstring pain on LLE. Pt states  that he has been having difficulty doing exercises because the step at his home is too high.   PERTINENT HISTORY: Pt reports that he just finished HHPT and he reports that it went well. His pain is well controlled and he is currently only taking two tylenols at the beginning of the day.   PAIN:  Are you having pain? Yes, lateral hamstring on LLE   PRECAUTIONS: None  RED FLAGS: None   WEIGHT BEARING RESTRICTIONS: No  FALLS:  Has patient fallen in last 6 months? No  LIVING ENVIRONMENT: Lives with: lives with their spouse Lives in: House/apartment Stairs: No Has following equipment at home: Single point cane, Environmental consultant - 2 wheeled, and shower chair  OCCUPATION: Retired   PLOF: Independent  PATIENT GOALS: Return to walking without a single point cane and hiking   NEXT MD VISIT: 02/27/23  OBJECTIVE:  VITALS: BP 119/75 HR 78 SpO2 100   DIAGNOSTIC FINDINGS: AP and lateral views of the knee reviewed.  Total knee arthroplasty  prosthesis in good position and alignment without any complicating  features.  There is no evidence of dislocation, periprosthetic fracture,  patella baja/alta   PATIENT SURVEYS:  FOTO 44/100   COGNITION: Overall cognitive status: Within functional limits for tasks assessed     SENSATION: WFL  EDEMA:  Circumferential: R/L 16"/18"  MUSCLE LENGTH: Hamstrings: Right NT deg; Left NT deg Maisie Fus test: Right NT deg; Left NT  deg  POSTURE: rounded shoulders  PALPATION: Anterior surface of left knee   LOWER EXTREMITY ROM:  Active/Passive ROM Right eval Left eval  Hip flexion    Hip extension    Hip abduction    Hip adduction    Hip internal rotation    Hip external rotation    Knee flexion 130/135 115/120  Knee extension 0/-1 4/3*  Ankle dorsiflexion    Ankle plantarflexion    Ankle inversion    Ankle eversion     (Blank rows = not tested)  LOWER EXTREMITY MMT:  MMT Right eval Left eval  Hip flexion    Hip extension    Hip  abduction    Hip adduction    Hip internal rotation    Hip external rotation    Knee flexion 4+ 4+  Knee extension 4+ 3-  Ankle dorsiflexion    Ankle plantarflexion    Ankle inversion    Ankle eversion     (Blank rows = not tested)   FUNCTIONAL TESTS:  Squat: Posterior knee translation with forward trunk lean   GAIT: Distance walked: 50 ft  Assistive device utilized: Single point cane Level of assistance: Modified independence Comments: Left sided antalgic gait    TODAY'S TREATMENT:                                                                                                                              DATE:   03/26/23: Nu-Step Seat and Arms at 9 for 5 min  OMEGA LE Extension on LLE #45 3 x 10  -Pt reports increased lateral hamstring pain  Seated HS Stretch on LLE 3 x 30 sec  Mini-Squat with BUE support 1 x 10  -min VC to maintain knee behind toes  Mini-Squat with 1 UE support 2 x 10  Standing Hip Abduction with BUE support 1 x 10  Standing Hip Abduction with 1 UE support 2 x 10  -Pt reports increased lateral hamstring pain  Supine HS Stretch LLE 3 x 30 sec with PT providing overpressure   03/21/23: All single leg exercises performed on LLE  TM 1.0 mph for 5 min Heel to toe 10 m x 6  -min VC for increased heel strike and toe off    OMEGA Leg Press #25 on LLE 1 x 10 OMEGA Leg Press #45 on LLE 2 x 10   Step  Up with 6 inch step with BUE support 1 x 10  Step Up with 6 inch step with 1 UE support 1 x 10  Step Up with 6 inch step with no UE support 1 x 10 -intermittent use of 1 UE  Forward Step Up on 4 inch step 1 x 10  Side Step Up on 4 inch step with 1 UE support 2 x 10  -min VC to step up with LLE and step down with RLE    03/14/23: All single leg exercises performed on LLE  Matrix Recumbent seat level 16 for resistance at 3 for 5 min  10 M ambulation with emphasis on heel toe on LLE x 6  Knee AROM  -Flex R/L 115/125  -Ext R/L 4/0 Knee PROM  -Flex R/L 117/127   -Ext R/L 3/0 FOTO: 59 OMEGA Knee Ext #10 3 x 10  Reverse Lunges with 1 UE support 1 x 10  Step Up on 4 inch step with 1 UE 2 x 10  Step Up on 6 inch step with two finger support 1 x 10    03/11/23: All single leg exercises performed on LLE  Matrix Recumbent seat level 17 for 5 min  Step Up on 4 inch step BUE support 1 x 10  Step Up on 4 inch step with 1 UE support 3 x 10   Step Up on 6 inch step with BUE Support 3 x 10  SLS with B UE assist to promote R LE single leg stance strength during gait 10x3 with 5 second holds Heel to toe gait exercise with 1 UE support 20 ft x 8    03/06/23: All single leg exercises performed on LLE  Matrix Recumbent seat level 17 for 5 min  Step Up on 4 inch step BUE support 1 x 10  Step Up on 4 inch step with 1 UE support 1 x 10   Step Up on 6 inch step with BUE Support 1 x 10  Heel to toe gait exercise with 1 UE support 20 ft x 8   PATIENT EDUCATION:  Education details: form and technique for correct performance of exercise  Person educated: Patient Education method: Explanation, Demonstration, Verbal cues, and Handouts Education comprehension: verbalized understanding, returned demonstration, and verbal cues required  HOME EXERCISE PROGRAM:  Access Code: [email or web address] URL: [email or web address] Date: 03/26/2023 Prepared by: Ellin Goodie  Exercises - Heel-Toe Walking  - 1 x daily - 10 reps - Standing Heel Raise  - 3-4 x weekly - 3 sets - 15 reps - Seated Hamstring Stretch  - 1 x daily - 3 reps - 30-60 sec  hold - Prone Quadriceps Stretch with Strap  - 1 x daily - 3 reps - 30-60 sec hold - Mini Squat  - 3-4 x weekly - 3 sets - 10 reps - Standing Hip Abduction with Counter Support  - 3-4 x weekly - 3 sets - 10 reps  ASSESSMENT:  CLINICAL IMPRESSION: Pt shows limitations throughout exercise session due to lateral hamstring pain. Pt reports improvement with stretching with relief in pain. HEP modified to remove step exercises due to  height of pt's step and hamstring stretch to relieve lateral posterior knee pain. He will continue to benefit from continued skilled PT services to improve strength, function, and ability to ambulate with less difficulty.   OBJECTIVE IMPAIRMENTS: Abnormal gait, difficulty walking, decreased ROM, decreased strength, hypomobility, increased edema, impaired flexibility, obesity, and pain.   ACTIVITY LIMITATIONS: carrying, lifting, bending, standing,  squatting, stairs, and caring for others  PARTICIPATION LIMITATIONS: driving, shopping, community activity, and yard work  PERSONAL FACTORS: 3+ comorbidities: h/o CHF, Afib, HLD, and hypothyroidism  are also affecting patient's functional outcome.   REHAB POTENTIAL: Good  CLINICAL DECISION MAKING: Stable/uncomplicated  EVALUATION COMPLEXITY: Low   GOALS: Goals reviewed with patient? No  SHORT TERM GOALS: Target date: 03/06/2023  Pt will be independent with HEP in order to improve strength and balance in order to decrease fall risk and improve function at home and work. Baseline: Has not attempted performing at home yet 03/14/23 Able to perform independently Goal status: ACHIEVED   2.  Patient will ambulate without use of an assistive device as a sign of improve left knee function.  Baseline: Using SPC  Goal status: ONGOING     LONG TERM GOALS: Target date: 05/01/2023  Patient will have improved function and activity level as evidenced by an increase in FOTO score by 10 points or more.  Baseline: 44/100 with target of 63 03/14/23 59 Goal status: Achieved   2.  Patient will improve left knee AROM to be symmetrical to right knee ROM for improved gait mechanics and gait stability.  Baseline: Knee Ext R/L 0/4  03/14/23: Knee Ext R/L 0/4, Knee Flex R/L 124/117 Goal status: ONGOING   3.  Patient will improve left knee strength to by symmetrical to right knee for improved gait mechanics and gait stability  Baseline: Knee Ext R/L 3-/4+, Hip Flex,  Ext, Abd NT  Goal status: ONGOING     PLAN:  PT FREQUENCY: 1-2x/week  PT DURATION: 10 weeks  PLANNED INTERVENTIONS: Therapeutic exercises, Neuromuscular re-education, Balance training, Gait training, Patient/Family education, Joint mobilization, Joint manipulation, Stair training, DME instructions, Aquatic Therapy, Dry Needling, Cryotherapy, Moist heat, Manual therapy, and Re-evaluation  PLAN FOR NEXT SESSION:   TM to warmup. OMEGA Knee Extension. Re-attempt forward step up and lateral step up   Ellin Goodie PT, DPT  [facility] [address], [place], [address] Phone: [phone]   Fax:  [phone]

## 2023-03-28 ENCOUNTER — Ambulatory Visit: Payer: Medicare Other | Admitting: Physical Therapy

## 2023-03-28 DIAGNOSIS — M25562 Pain in left knee: Secondary | ICD-10-CM | POA: Diagnosis not present

## 2023-03-28 NOTE — Therapy (Signed)
OUTPATIENT PHYSICAL THERAPY LOWER EXTREMITY TREATMENT    Patient Name: James [name] MRN: [id number] DOB:Mar 29, 1946, 77 y.o., male Today's Date: 03/28/2023  END OF SESSION:  PT End of Session - 03/28/23 1119     Visit Number 9    Number of Visits 20    Date for PT Re-Evaluation 05/01/23    Authorization Type [id number]    Authorization - Visit Number 9    Authorization - Number of Visits 20    Progress Note Due on Visit 10    PT Start Time 1115    PT Stop Time 1200    PT Time Calculation (min) 45 min    Activity Tolerance Patient tolerated treatment well    Behavior During Therapy WFL for tasks assessed/performed                Past Medical History:  Diagnosis Date   (HFpEF) heart failure with preserved ejection fraction (HCC)    a. 05/2018 Echo: EF 55-60%, gr2 DD.   Acute lower GI bleeding after colonoscopy and polypectomy, resolved 12/12/2015   Ascending aortic aneurysm (HCC)    a. 02/2018 Echo: mildly dil Ao root/asc ao/arch; b. 05/2018 CTA Chest: 4.7cm fusiform aneurysm of Asc Ao.   CAD (coronary artery disease)    a. 05/2018 Cath/PCI: LM nl,LAD 30p, 90/99d/apical, LCX 4m, OM1/2/3 min irregs, RCA 85p (3.5x18 [place] DES).   Cardiac murmur    a. 02/2018 Echo: EF 60-65%, no rwma, mild AI, mildly dil Ao root/Asc Ao/Arch, Mild MR, mildly dil LA. Nl RV fxn.   Cataract    bilateral surgery to remove   CHF (congestive heart failure) (HCC)    Colon polyp    Constipation    Dysrhythmia    Hx A. Fib   Essential hypertension    Hemorrhoids    Hepatitis    self resolved, likely food exposure, 1974.    History of cardiovascular stress test    a. 02/2018 Myoview: EF 55-65%, small/mild apical defect w/ nl wall motion ->attenuation artifact. No ischemia. Low risk study.   Hx of adenomatous colonic polyps 12/05/2015   Hyperlipidemia    Hypothyroidism    Mitral regurgitation    a. 110/2019 Echo: EF 55-60%. Gr2 DD. Triv AI. Ao root 38mm. Mild MR. Mod dil LA, mildly dil RA.    Osteoarthritis    Persistent atrial fibrillation (HCC)    a. Dx 02/2018. CHA2DS2VASc = 2-->Eliquis; b. 03/2018 DCCV (200J); c. 03/2018 recurrent Afib-->flecainide started 04/2018;  d. 05/06/2018 s/p successful DCCV - 200J x 2-->on amio.   Skin cancer    basal cell, R ear, MOHS   Past Surgical History:  Procedure Laterality Date   BROW LIFT Bilateral 10/23/2016   Procedure: BLEPHAROPLASTY upper eyelid with excess skin;  Surgeon: Imagene Riches, MD;  Location: [facility];  Service: Ophthalmology;  Laterality: Bilateral;  MAC   CARDIOVERSION N/A 03/18/2018   Procedure: CARDIOVERSION;  Surgeon: Yvonne Kendall, MD;  Location: [facility];  Service: Cardiovascular;  Laterality: N/A;   CARDIOVERSION N/A 05/06/2018   Procedure: CARDIOVERSION;  Surgeon: Yvonne Kendall, MD;  Location: [facility];  Service: Cardiovascular;  Laterality: N/A;   CATARACT EXTRACTION  1986   OD   CATARACT EXTRACTION Bilateral 1995   x 2 for right and left    COLONOSCOPY     CORONARY STENT INTERVENTION N/A 05/13/2018   Procedure: CORONARY STENT INTERVENTION;  Surgeon: Yvonne Kendall, MD;  Location: [facility];  Service: Cardiovascular;  Laterality: N/A;  ECTROPION REPAIR Bilateral 10/23/2016   Procedure: REPAIR OF ECTROPION sutures, extensive;  Surgeon: Imagene Riches, MD;  Location: [facility];  Service: Ophthalmology;  Laterality: Bilateral;   LIPOMA EXCISION  06/1997   left neck ([place])   MOHS SURGERY Right 2013   behind right ear   RIGHT/LEFT HEART CATH AND CORONARY ANGIOGRAPHY N/A 05/12/2018   Procedure: RIGHT/LEFT HEART CATH AND CORONARY ANGIOGRAPHY;  Surgeon: Iran Ouch, MD;  Location: [facility];  Service: Cardiovascular;  Laterality: N/A;   TONSILLECTOMY     TOTAL KNEE ARTHROPLASTY Left 01/17/2023   Procedure: LEFT TOTAL KNEE ARTHROPLASTY;  Surgeon: Cammy Copa, MD;  Location: [facility];  Service: Orthopedics;  Laterality: Left;   VASECTOMY  1982   Patient Active  Problem List   Diagnosis Date Noted   Arthritis of left knee 02/03/2023   Anemia 01/30/2023   Dehydration 01/21/2023   Near syncope 01/20/2023   AKI (acute kidney injury) (HCC) 01/20/2023   Septic joint of left knee joint (HCC) 01/20/2023   Elevated lactic acid level 01/20/2023   Severe sepsis (HCC) 01/20/2023   OA (osteoarthritis) of knee 01/17/2023   S/P total knee replacement, left 01/17/2023   Pruritus 03/21/2022   Afib (HCC) 11/07/2021   Hyperglycemia 08/27/2021   Dysphagia 08/27/2021   Thoracic aortic aneurysm without rupture (HCC) 11/25/2019   Chronic heart failure with preserved ejection fraction (HFpEF) (HCC) 08/18/2018   Hyperlipidemia LDL goal <70 08/18/2018   Coronary artery disease involving native coronary artery of native heart without angina pectoris 05/16/2018   Unstable angina (HCC)    CHF (congestive heart failure) (HCC) 05/09/2018   Benign prostatic hyperplasia with nocturia 05/07/2018   Dyspnea on exertion 02/19/2018   Persistent atrial fibrillation (HCC) 02/12/2018   Cardiac murmur 02/12/2018   Pain and swelling of left knee 08/07/2017   Healthcare maintenance 05/01/2016   Hematuria 03/06/2016   Hx of adenomatous colonic polyps 12/05/2015   Advance care planning 07/21/2014   Medicare annual wellness visit, subsequent 07/01/2012   External hemorrhoids 06/27/2011   Hypothyroidism 02/24/2008   HYPERCHOLESTEROLEMIA 02/21/2007   Essential hypertension 02/21/2007   DEGENERATIVE JOINT DISEASE, CERVICAL SPINE 02/21/2007    PCP: Dr. Crawford Givens   REFERRING PROVIDER: Harriette Bouillon PA-C  REFERRING DIAG: Left TKA   THERAPY DIAG:  Left knee pain, unspecified chronicity  Rationale for Evaluation and Treatment: Rehabilitation  ONSET DATE: 01/17/23  SUBJECTIVE:   SUBJECTIVE STATEMENT: Pt states that exercises have been helpful and that he does not feel that much increase in his right knee pain.   PERTINENT HISTORY: Pt reports that he just finished  HHPT and he reports that it went well. His pain is well controlled and he is currently only taking two tylenols at the beginning of the day.   PAIN:  Are you having pain? 2-3 NRPS on Left lateral hamstring   PRECAUTIONS: None  RED FLAGS: None   WEIGHT BEARING RESTRICTIONS: No  FALLS:  Has patient fallen in last 6 months? No  LIVING ENVIRONMENT: Lives with: lives with their spouse Lives in: House/apartment Stairs: No Has following equipment at home: Single point cane, Environmental consultant - 2 wheeled, and shower chair  OCCUPATION: Retired   PLOF: Independent  PATIENT GOALS: Return to walking without a single point cane and hiking   NEXT MD VISIT: 02/27/23  OBJECTIVE:   VITALS: BP 119/75 HR 78 SpO2 100   DIAGNOSTIC FINDINGS: AP and lateral views of the knee reviewed.  Total knee arthroplasty  prosthesis in good position and alignment without any complicating  features.  There is no evidence of dislocation, periprosthetic fracture,  patella baja/alta   PATIENT SURVEYS:  FOTO 44/100   COGNITION: Overall cognitive status: Within functional limits for tasks assessed     SENSATION: WFL  EDEMA:  Circumferential: R/L 16"/18"  MUSCLE LENGTH: Hamstrings: Right NT deg; Left NT deg Maisie Fus test: Right NT deg; Left NT  deg  POSTURE: rounded shoulders  PALPATION: Anterior surface of left knee   LOWER EXTREMITY ROM:  Active/Passive ROM Right eval Left eval  Hip flexion    Hip extension    Hip abduction    Hip adduction    Hip internal rotation    Hip external rotation    Knee flexion 130/135 115/120  Knee extension 0/-1 4/3*  Ankle dorsiflexion    Ankle plantarflexion    Ankle inversion    Ankle eversion     (Blank rows = not tested)  LOWER EXTREMITY MMT:  MMT Right eval Left eval  Hip flexion    Hip extension    Hip abduction    Hip adduction    Hip internal rotation    Hip external rotation    Knee flexion 4+ 4+  Knee extension 4+ 3-  Ankle dorsiflexion     Ankle plantarflexion    Ankle inversion    Ankle eversion     (Blank rows = not tested)   FUNCTIONAL TESTS:  Squat: Posterior knee translation with forward trunk lean   GAIT: Distance walked: 50 ft  Assistive device utilized: Single point cane Level of assistance: Modified independence Comments: Left sided antalgic gait    TODAY'S TREATMENT:                                                                                                                              DATE:   03/28/23: Matrix recumbent bicycle seat at 16 with 2 resistance for 5 min  OMEGA Knee Extension #5 1 x 10  -Pt unable to reach terminal knee extension  Seated Long Arc Quad #5 AW 3 x 10 on LLE  Step Up on 4 inch step with 1 UE support 1 x 10  Step Up on  Single Leg Stance 3 x 10 sec  -Pt unable to achieve 10 sec without UE support  Tandem Stance 2 x 10 sec   OMEGA Leg Press on LLE with #45 lbs 3 x 10   03/26/23: Nu-Step Seat and Arms at 9 for 5 min  OMEGA LE Extension on LLE #45 3 x 10  -Pt reports increased lateral hamstring pain  Seated HS Stretch on LLE 3 x 30 sec  Mini-Squat with BUE support 1 x 10  -min VC to maintain knee behind toes  Mini-Squat with 1 UE support 2 x 10  Standing Hip Abduction with BUE support 1 x 10  Standing Hip Abduction with 1 UE support 2 x 10  -Pt reports  increased lateral hamstring pain  Supine HS Stretch LLE 3 x 30 sec with PT providing overpressure   03/21/23: All single leg exercises performed on LLE  TM 1.0 mph for 5 min Heel to toe 10 m x 6  -min VC for increased heel strike and toe off    OMEGA Leg Press #25 on LLE 1 x 10 OMEGA Leg Press #45 on LLE 2 x 10   Step Up with 6 inch step with BUE support 1 x 10  Step Up with 6 inch step with 1 UE support 1 x 10  Step Up with 6 inch step with no UE support 1 x 10 -intermittent use of 1 UE  Forward Step Up on 4 inch step 1 x 10  Side Step Up on 4 inch step with 1 UE support 2 x 10  -min VC to step up with LLE and  step down with RLE    03/14/23: All single leg exercises performed on LLE  Matrix Recumbent seat level 16 for resistance at 3 for 5 min  10 M ambulation with emphasis on heel toe on LLE x 6  Knee AROM  -Flex R/L 115/125  -Ext R/L 4/0 Knee PROM  -Flex R/L 117/127  -Ext R/L 3/0 FOTO: 59 OMEGA Knee Ext #10 3 x 10  Reverse Lunges with 1 UE support 1 x 10  Step Up on 4 inch step with 1 UE 2 x 10  Step Up on 6 inch step with two finger support 1 x 10    03/11/23: All single leg exercises performed on LLE  Matrix Recumbent seat level 17 for 5 min  Step Up on 4 inch step BUE support 1 x 10  Step Up on 4 inch step with 1 UE support 3 x 10   Step Up on 6 inch step with BUE Support 3 x 10  SLS with B UE assist to promote R LE single leg stance strength during gait 10x3 with 5 second holds Heel to toe gait exercise with 1 UE support 20 ft x 8   PATIENT EDUCATION:  Education details: form and technique for correct performance of exercise  Person educated: Patient Education method: Explanation, Demonstration, Verbal cues, and Handouts Education comprehension: verbalized understanding, returned demonstration, and verbal cues required  HOME EXERCISE PROGRAM:  Access Code: [email or web address] URL: [email or web address] Date: 03/26/2023 Prepared by: Ellin Goodie  Exercises - Heel-Toe Walking  - 1 x daily - 10 reps - Standing Heel Raise  - 3-4 x weekly - 3 sets - 15 reps - Seated Hamstring Stretch  - 1 x daily - 3 reps - 30-60 sec  hold - Prone Quadriceps Stretch with Strap  - 1 x daily - 3 reps - 30-60 sec hold - Mini Squat  - 3-4 x weekly - 3 sets - 10 reps - Standing Hip Abduction with Counter Support  - 3-4 x weekly - 3 sets - 10 reps  ASSESSMENT:  CLINICAL IMPRESSION: Pt shows improvement with left quad strength with ability to perform single leg step ups with resistance along with increased AROM with resistance with long arc quads. Pt continues to show ongoing quad atrophy and  weakness with extension and difficulty walking without UE support. He will continue to benefit from continued skilled PT services to improve strength, function, and ability to ambulate with less difficulty.  OBJECTIVE IMPAIRMENTS: Abnormal gait, difficulty walking, decreased ROM, decreased strength, hypomobility, increased edema, impaired flexibility, obesity, and pain.   ACTIVITY LIMITATIONS: carrying,  lifting, bending, standing, squatting, stairs, and caring for others  PARTICIPATION LIMITATIONS: driving, shopping, community activity, and yard work  PERSONAL FACTORS: 3+ comorbidities: h/o CHF, Afib, HLD, and hypothyroidism  are also affecting patient's functional outcome.   REHAB POTENTIAL: Good  CLINICAL DECISION MAKING: Stable/uncomplicated  EVALUATION COMPLEXITY: Low   GOALS: Goals reviewed with patient? No  SHORT TERM GOALS: Target date: 03/06/2023  Pt will be independent with HEP in order to improve strength and balance in order to decrease fall risk and improve function at home and work. Baseline: Has not attempted performing at home yet 03/14/23 Able to perform independently Goal status: ACHIEVED   2.  Patient will ambulate without use of an assistive device as a sign of improve left knee function.  Baseline: Using SPC  Goal status: ONGOING     LONG TERM GOALS: Target date: 05/01/2023  Patient will have improved function and activity level as evidenced by an increase in FOTO score by 10 points or more.  Baseline: 44/100 with target of 63 03/14/23 59 Goal status: Achieved   2.  Patient will improve left knee AROM to be symmetrical to right knee ROM for improved gait mechanics and gait stability.  Baseline: Knee Ext R/L 0/4  03/14/23: Knee Ext R/L 0/4, Knee Flex R/L 124/117 Goal status: ONGOING   3.  Patient will improve left knee strength to by symmetrical to right knee for improved gait mechanics and gait stability  Baseline: Knee Ext R/L 3-/4+, Hip Flex, Ext, Abd NT   Goal status: ONGOING     PLAN:  PT FREQUENCY: 1-2x/week  PT DURATION: 10 weeks  PLANNED INTERVENTIONS: Therapeutic exercises, Neuromuscular re-education, Balance training, Gait training, Patient/Family education, Joint mobilization, Joint manipulation, Stair training, DME instructions, Aquatic Therapy, Dry Needling, Cryotherapy, Moist heat, Manual therapy, and Re-evaluation  PLAN FOR NEXT SESSION:   Progress note and reassess goals. TM to warmup and continued quad strengthening.   Ellin Goodie PT, DPT  [facility] [address] [place], [address] Phone: [phone]   Fax:  [phone]

## 2023-04-02 ENCOUNTER — Ambulatory Visit: Payer: Medicare Other | Admitting: Physical Therapy

## 2023-04-02 ENCOUNTER — Encounter: Payer: Self-pay | Admitting: Physical Therapy

## 2023-04-02 DIAGNOSIS — M25562 Pain in left knee: Secondary | ICD-10-CM | POA: Diagnosis not present

## 2023-04-02 NOTE — Therapy (Signed)
OUTPATIENT PHYSICAL THERAPY LOWER EXTREMITY PROGRESS NOTE   Patient Name: James [name] MRN: [id number] DOB:04-07-1946, 77 y.o., male Today's Date: 04/02/2023  END OF SESSION:  PT End of Session - 04/02/23 1311     Visit Number 10    Number of Visits 20    Date for PT Re-Evaluation 05/01/23    Authorization Type [id number]    Authorization - Visit Number 10    Authorization - Number of Visits 20    Progress Note Due on Visit 10    Activity Tolerance Patient tolerated treatment well    Behavior During Therapy [facility] for tasks assessed/performed                Past Medical History:  Diagnosis Date   (HFpEF) heart failure with preserved ejection fraction (HCC)    a. 05/2018 Echo: EF 55-60%, gr2 DD.   Acute lower GI bleeding after colonoscopy and polypectomy, resolved 12/12/2015   Ascending aortic aneurysm (HCC)    a. 02/2018 Echo: mildly dil Ao root/asc ao/arch; b. 05/2018 CTA Chest: 4.7cm fusiform aneurysm of Asc Ao.   CAD (coronary artery disease)    a. 05/2018 Cath/PCI: LM nl,LAD 30p, 90/99d/apical, LCX 81m, OM1/2/3 min irregs, RCA 85p (3.5x18 [place] DES).   Cardiac murmur    a. 02/2018 Echo: EF 60-65%, no rwma, mild AI, mildly dil Ao root/Asc Ao/Arch, Mild MR, mildly dil LA. Nl RV fxn.   Cataract    bilateral surgery to remove   CHF (congestive heart failure) (HCC)    Colon polyp    Constipation    Dysrhythmia    Hx A. Fib   Essential hypertension    Hemorrhoids    Hepatitis    self resolved, likely food exposure, 1974.    History of cardiovascular stress test    a. 02/2018 Myoview: EF 55-65%, small/mild apical defect w/ nl wall motion ->attenuation artifact. No ischemia. Low risk study.   Hx of adenomatous colonic polyps 12/05/2015   Hyperlipidemia    Hypothyroidism    Mitral regurgitation    a. 110/2019 Echo: EF 55-60%. Gr2 DD. Triv AI. Ao root 38mm. Mild MR. Mod dil LA, mildly dil RA.   Osteoarthritis    Oxygen deficiency    Persistent atrial fibrillation  (HCC)    a. Dx 02/2018. CHA2DS2VASc = 2-->Eliquis; b. 03/2018 DCCV (200J); c. 03/2018 recurrent Afib-->flecainide started 04/2018;  d. 05/06/2018 s/p successful DCCV - 200J x 2-->on amio.   Skin cancer    basal cell, R ear, MOHS   Past Surgical History:  Procedure Laterality Date   BROW LIFT Bilateral 10/23/2016   Procedure: BLEPHAROPLASTY upper eyelid with excess skin;  Surgeon: Imagene Riches, MD;  Location: [facility];  Service: Ophthalmology;  Laterality: Bilateral;  MAC   CARDIOVERSION N/A 03/18/2018   Procedure: CARDIOVERSION;  Surgeon: Yvonne Kendall, MD;  Location: [facility] OR[facility];  Service: Cardiovascular;  Laterality: N/A;   CARDIOVERSION N/A 05/06/2018   Procedure: CARDIOVERSION;  Surgeon: Yvonne Kendall, MD;  Location: [facility];  Service: Cardiovascular;  Laterality: N/A;   CATARACT EXTRACTION  1986   OD   CATARACT EXTRACTION Bilateral 1995   x 2 for right and left    COLONOSCOPY     CORONARY STENT INTERVENTION N/A 05/13/2018   Procedure: CORONARY STENT INTERVENTION;  Surgeon: Yvonne Kendall, MD;  Location: [facility];  Service: Cardiovascular;  Laterality: N/A;   ECTROPION REPAIR Bilateral 10/23/2016   Procedure: REPAIR OF ECTROPION sutures, extensive;  Surgeon: Hubbard Robinson  Ether Griffins, MD;  Location: [facility];  Service: Ophthalmology;  Laterality: Bilateral;   LIPOMA EXCISION  06/1997   left neck ([place])   MOHS SURGERY Right 2013   behind right ear   RIGHT/LEFT HEART CATH AND CORONARY ANGIOGRAPHY N/A 05/12/2018   Procedure: RIGHT/LEFT HEART CATH AND CORONARY ANGIOGRAPHY;  Surgeon: Iran Ouch, MD;  Location: [facility];  Service: Cardiovascular;  Laterality: N/A;   TONSILLECTOMY     TOTAL KNEE ARTHROPLASTY Left 01/17/2023   Procedure: LEFT TOTAL KNEE ARTHROPLASTY;  Surgeon: Cammy Copa, MD;  Location: [facility] OR;  Service: Orthopedics;  Laterality: Left;   VASECTOMY  1982   Patient Active Problem List   Diagnosis Date Noted   Arthritis of left  knee 02/03/2023   Anemia 01/30/2023   Dehydration 01/21/2023   Near syncope 01/20/2023   AKI (acute kidney injury) (HCC) 01/20/2023   Septic joint of left knee joint (HCC) 01/20/2023   Elevated lactic acid level 01/20/2023   Severe sepsis (HCC) 01/20/2023   OA (osteoarthritis) of knee 01/17/2023   S/P total knee replacement, left 01/17/2023   Pruritus 03/21/2022   Afib (HCC) 11/07/2021   Hyperglycemia 08/27/2021   Dysphagia 08/27/2021   Thoracic aortic aneurysm without rupture (HCC) 11/25/2019   Chronic heart failure with preserved ejection fraction (HFpEF) (HCC) 08/18/2018   Hyperlipidemia LDL goal <70 08/18/2018   Coronary artery disease involving native coronary artery of native heart without angina pectoris 05/16/2018   Unstable angina (HCC)    CHF (congestive heart failure) (HCC) 05/09/2018   Benign prostatic hyperplasia with nocturia 05/07/2018   Dyspnea on exertion 02/19/2018   Persistent atrial fibrillation (HCC) 02/12/2018   Cardiac murmur 02/12/2018   Pain and swelling of left knee 08/07/2017   Healthcare maintenance 05/01/2016   Hematuria 03/06/2016   Hx of adenomatous colonic polyps 12/05/2015   Advance care planning 07/21/2014   Medicare annual wellness visit, subsequent 07/01/2012   External hemorrhoids 06/27/2011   Hypothyroidism 02/24/2008   HYPERCHOLESTEROLEMIA 02/21/2007   Essential hypertension 02/21/2007   DEGENERATIVE JOINT DISEASE, CERVICAL SPINE 02/21/2007    PCP: Dr. Crawford Givens   REFERRING PROVIDER: Harriette Bouillon PA-C  REFERRING DIAG: Left TKA   THERAPY DIAG:  Left knee pain, unspecified chronicity  Rationale for Evaluation and Treatment: Rehabilitation  ONSET DATE: 01/17/23  SUBJECTIVE:   SUBJECTIVE STATEMENT: Pt states that exercises have been helpful and that he does not feel that much increase in his right knee pain.   PERTINENT HISTORY: Pt reports that he just finished HHPT and he reports that it went well. His pain is well  controlled and he is currently only taking two tylenols at the beginning of the day.   PAIN:  Are you having pain? 2-3 NRPS on Left lateral hamstring   PRECAUTIONS: None  RED FLAGS: None   WEIGHT BEARING RESTRICTIONS: No  FALLS:  Has patient fallen in last 6 months? No  LIVING ENVIRONMENT: Lives with: lives with their spouse Lives in: House/apartment Stairs: No Has following equipment at home: Single point cane, Environmental consultant - 2 wheeled, and shower chair  OCCUPATION: Retired   PLOF: Independent  PATIENT GOALS: Return to walking without a single point cane and hiking   NEXT MD VISIT: 02/27/23  OBJECTIVE:   VITALS: BP 119/75 HR 78 SpO2 100   DIAGNOSTIC FINDINGS: AP and lateral views of the knee reviewed.  Total knee arthroplasty  prosthesis in good position and alignment without any complicating  features.  There is no evidence  of dislocation, periprosthetic fracture,  patella baja/alta   PATIENT SURVEYS:  FOTO 44/100   COGNITION: Overall cognitive status: Within functional limits for tasks assessed     SENSATION: WFL  EDEMA:  Circumferential: R/L 16"/18"  MUSCLE LENGTH: Hamstrings: Right NT deg; Left NT deg Maisie Fus test: Right NT deg; Left NT  deg  POSTURE: rounded shoulders  PALPATION: Anterior surface of left knee   LOWER EXTREMITY ROM:  Active/Passive ROM Right eval Left eval Right  Eval Left  Eval   Hip flexion      Hip extension      Hip abduction      Hip adduction      Hip internal rotation      Hip external rotation      Knee flexion 130/135 115/120 125/125 115/115  Knee extension 0/-1 4/3* 2/1 2/1  Ankle dorsiflexion      Ankle plantarflexion      Ankle inversion      Ankle eversion       (Blank rows = not tested)  LOWER EXTREMITY MMT:  MMT Right eval Left eval Right  Eval Left  Eval   Hip flexion      Hip extension      Hip abduction      Hip adduction      Hip internal rotation      Hip external rotation      Knee flexion  4+ 4+ 4+ 4+  Knee extension 4+ 3- 4+ 4+  Ankle dorsiflexion      Ankle plantarflexion      Ankle inversion      Ankle eversion       (Blank rows = not tested)   FUNCTIONAL TESTS:  Squat: Posterior knee translation with forward trunk lean   GAIT: Distance walked: 50 ft  Assistive device utilized: Single point cane Level of assistance: Modified independence Comments: Left sided antalgic gait    TODAY'S TREATMENT:                                                                                                                              DATE:   04/02/23: All single leg exercises performed on LLE  Nu-Step Level 8, resistance 3 for 5 min  See Knee ROM listed below See Knee Strength listed below  OMEGA Knee Extension #5 on LLE 1 x 3  -Pt is unable to full extend his knee  Seated Long Arc Quad #5 AW 3 x 15  FOTO: 56/100   Overground Ambulation 5 x 100 ft  -Left hip drop with left trunk lean for compensation   03/28/23: Matrix recumbent bicycle seat at 16 with 2 resistance for 5 min  OMEGA Knee Extension #5 1 x 10  -Pt unable to reach terminal knee extension  Seated Long Arc Quad #5 AW 3 x 10 on LLE  Step Up on 4 inch step with 1 UE support 1 x 10  Step Up on  Single Leg Stance 3 x 10 sec  -Pt unable to achieve 10 sec without UE support  Tandem Stance 2 x 10 sec   OMEGA Leg Press on LLE with #45 lbs 3 x 10   03/26/23: Nu-Step Seat and Arms at 9 for 5 min  OMEGA LE Extension on LLE #45 3 x 10  -Pt reports increased lateral hamstring pain  Seated HS Stretch on LLE 3 x 30 sec  Mini-Squat with BUE support 1 x 10  -min VC to maintain knee behind toes  Mini-Squat with 1 UE support 2 x 10  Standing Hip Abduction with BUE support 1 x 10  Standing Hip Abduction with 1 UE support 2 x 10  -Pt reports increased lateral hamstring pain  Supine HS Stretch LLE 3 x 30 sec with PT providing overpressure   03/21/23: All single leg exercises performed on LLE  TM 1.0 mph for 5 min Heel to  toe 10 m x 6  -min VC for increased heel strike and toe off    OMEGA Leg Press #25 on LLE 1 x 10 OMEGA Leg Press #45 on LLE 2 x 10   Step Up with 6 inch step with BUE support 1 x 10  Step Up with 6 inch step with 1 UE support 1 x 10  Step Up with 6 inch step with no UE support 1 x 10 -intermittent use of 1 UE  Forward Step Up on 4 inch step 1 x 10  Side Step Up on 4 inch step with 1 UE support 2 x 10  -min VC to step up with LLE and step down with RLE    03/14/23: All single leg exercises performed on LLE  Matrix Recumbent seat level 16 for resistance at 3 for 5 min  10 M ambulation with emphasis on heel toe on LLE x 6  Knee AROM  -Flex R/L 115/125  -Ext R/L 4/0 Knee PROM  -Flex R/L 117/127  -Ext R/L 3/0 FOTO: 59 OMEGA Knee Ext #10 3 x 10  Reverse Lunges with 1 UE support 1 x 10  Step Up on 4 inch step with 1 UE 2 x 10  Step Up on 6 inch step with two finger support 1 x 10     PATIENT EDUCATION:  Education details: form and technique for correct performance of exercise  Person educated: Patient Education method: Explanation, Demonstration, Verbal cues, and Handouts Education comprehension: verbalized understanding, returned demonstration, and verbal cues required  HOME EXERCISE PROGRAM:  Access Code: [email or web address] URL: [email or web address] Date: 03/26/2023 Prepared by: Ellin Goodie  Exercises - Heel-Toe Walking  - 1 x daily - 10 reps - Standing Heel Raise  - 3-4 x weekly - 3 sets - 15 reps - Seated Hamstring Stretch  - 1 x daily - 3 reps - 30-60 sec  hold - Prone Quadriceps Stretch with Strap  - 1 x daily - 3 reps - 30-60 sec hold - Mini Squat  - 3-4 x weekly - 3 sets - 10 reps - Standing Hip Abduction with Counter Support  - 3-4 x weekly - 3 sets - 10 reps  ASSESSMENT:  CLINICAL IMPRESSION: Pt shows improvement with left knee ROM and strength and he has now met most of his rehab goals. He still has not met FOTO goal with lack of confidence in his left  knee function especially when walker longer distance or over obstacles. Gait analysis revealed left sided hip drop with left lateral lean trunk compensation.  Exercise will be modified going forward to focus on improving endurance. He will continue to benefit from continued skilled PT services to improve strength, function, and ability to ambulate with less difficulty.  OBJECTIVE IMPAIRMENTS: Abnormal gait, difficulty walking, decreased ROM, decreased strength, hypomobility, increased edema, impaired flexibility, obesity, and pain.   ACTIVITY LIMITATIONS: carrying, lifting, bending, standing, squatting, stairs, and caring for others  PARTICIPATION LIMITATIONS: driving, shopping, community activity, and yard work  PERSONAL FACTORS: 3+ comorbidities: h/o CHF, Afib, HLD, and hypothyroidism  are also affecting patient's functional outcome.   REHAB POTENTIAL: Good  CLINICAL DECISION MAKING: Stable/uncomplicated  EVALUATION COMPLEXITY: Low   GOALS: Goals reviewed with patient? No  SHORT TERM GOALS: Target date: 03/06/2023  Pt will be independent with HEP in order to improve strength and balance in order to decrease fall risk and improve function at home and work. Baseline: Has not attempted performing at home yet 03/14/23 Able to perform independently Goal status: ACHIEVED   2.  Patient will ambulate without use of an assistive device as a sign of improve left knee function.  Baseline: Using [facility] 04/02/23: Able to not use SPC to walk  Goal status: ACHIEVED     LONG TERM GOALS: Target date: 05/01/2023  Patient will have improved function and activity level as evidenced by an increase in FOTO score by 10 points or more.  Baseline: 44/100 with target of 63 03/14/23 59 04/02/23: 56  Goal status: Achieved   2.  Patient will improve left knee AROM to be symmetrical to right knee ROM for improved gait mechanics and gait stability.  Baseline: Knee Ext R/L 0/4  03/14/23: Knee Ext R/L 0/4, Knee Flex R/L  124/117 04/02/23: Knee Ext R/L 2/2, Knee Flex R/L 125/115  Goal status: ONGOING   3.  Patient will improve left knee strength to by symmetrical to right knee for improved gait mechanics and gait stability  Baseline: Knee Ext R/L 3-/4+, Hip Flex, Ext, Abd NT 04/02/23:  Knee Ext R/L 4+/4+ Goal status: ACHIEVED     PLAN:  PT FREQUENCY: 1-2x/week  PT DURATION: 10 weeks  PLANNED INTERVENTIONS: Therapeutic exercises, Neuromuscular re-education, Balance training, Gait training, Patient/Family education, Joint mobilization, Joint manipulation, Stair training, DME instructions, Aquatic Therapy, Dry Needling, Cryotherapy, Moist heat, Manual therapy, and Re-evaluation  PLAN FOR NEXT SESSION: Glute med strengthening and continue quad strengthening.  Overground walking outside over uneven surfaces.   Ellin Goodie PT, DPT  [facility] [address], [place], [address] Phone: [phone]   Fax:  [phone]

## 2023-04-04 ENCOUNTER — Ambulatory Visit: Payer: Medicare Other | Admitting: Physical Therapy

## 2023-04-04 ENCOUNTER — Encounter: Payer: Self-pay | Admitting: Physical Therapy

## 2023-04-04 DIAGNOSIS — M25562 Pain in left knee: Secondary | ICD-10-CM

## 2023-04-04 NOTE — Therapy (Signed)
OUTPATIENT PHYSICAL THERAPY LOWER EXTREMITY PROGRESS NOTE   Patient Name: James [name] MRN: [id number] DOB:12-07-1945, 77 y.o., male Today's Date: 04/04/2023  END OF SESSION:  PT End of Session - 04/04/23 1118     Visit Number 11    Number of Visits 20    Date for PT Re-Evaluation 05/01/23    Authorization Type [id number]    Authorization - Visit Number 11    Authorization - Number of Visits 20    Progress Note Due on Visit 10    PT Start Time 1115    PT Stop Time 1200    PT Time Calculation (min) 45 min    Activity Tolerance Patient tolerated treatment well    Behavior During Therapy WFL for tasks assessed/performed                Past Medical History:  Diagnosis Date   (HFpEF) heart failure with preserved ejection fraction (HCC)    a. 05/2018 Echo: EF 55-60%, gr2 DD.   Acute lower GI bleeding after colonoscopy and polypectomy, resolved 12/12/2015   Ascending aortic aneurysm (HCC)    a. 02/2018 Echo: mildly dil Ao root/asc ao/arch; b. 05/2018 CTA Chest: 4.7cm fusiform aneurysm of Asc Ao.   CAD (coronary artery disease)    a. 05/2018 Cath/PCI: LM nl,LAD 30p, 90/99d/apical, LCX 87m, OM1/2/3 min irregs, RCA 85p (3.5x18 [place] DES).   Cardiac murmur    a. 02/2018 Echo: EF 60-65%, no rwma, mild AI, mildly dil Ao root/Asc Ao/Arch, Mild MR, mildly dil LA. Nl RV fxn.   Cataract    bilateral surgery to remove   CHF (congestive heart failure) (HCC)    Colon polyp    Constipation    Dysrhythmia    Hx A. Fib   Essential hypertension    Hemorrhoids    Hepatitis    self resolved, likely food exposure, 1974.    History of cardiovascular stress test    a. 02/2018 Myoview: EF 55-65%, small/mild apical defect w/ nl wall motion ->attenuation artifact. No ischemia. Low risk study.   Hx of adenomatous colonic polyps 12/05/2015   Hyperlipidemia    Hypothyroidism    Mitral regurgitation    a. 110/2019 Echo: EF 55-60%. Gr2 DD. Triv AI. Ao root 38mm. Mild MR. Mod dil LA, mildly dil  RA.   Osteoarthritis    Oxygen deficiency    Persistent atrial fibrillation (HCC)    a. Dx 02/2018. CHA2DS2VASc = 2-->Eliquis; b. 03/2018 DCCV (200J); c. 03/2018 recurrent Afib-->flecainide started 04/2018;  d. 05/06/2018 s/p successful DCCV - 200J x 2-->on amio.   Skin cancer    basal cell, R ear, MOHS   Past Surgical History:  Procedure Laterality Date   BROW LIFT Bilateral 10/23/2016   Procedure: BLEPHAROPLASTY upper eyelid with excess skin;  Surgeon: Imagene Riches, MD;  Location: [facility];  Service: Ophthalmology;  Laterality: Bilateral;  MAC   CARDIOVERSION N/A 03/18/2018   Procedure: CARDIOVERSION;  Surgeon: Yvonne Kendall, MD;  Location: [facility];  Service: Cardiovascular;  Laterality: N/A;   CARDIOVERSION N/A 05/06/2018   Procedure: CARDIOVERSION;  Surgeon: Yvonne Kendall, MD;  Location: [facility];  Service: Cardiovascular;  Laterality: N/A;   CATARACT EXTRACTION  1986   OD   CATARACT EXTRACTION Bilateral 1995   x 2 for right and left    COLONOSCOPY     CORONARY STENT INTERVENTION N/A 05/13/2018   Procedure: CORONARY STENT INTERVENTION;  Surgeon: Yvonne Kendall, MD;  Location: [facility];  Service: Cardiovascular;  Laterality: N/A;   ECTROPION REPAIR Bilateral 10/23/2016   Procedure: REPAIR OF ECTROPION sutures, extensive;  Surgeon: Imagene Riches, MD;  Location: [facility];  Service: Ophthalmology;  Laterality: Bilateral;   LIPOMA EXCISION  06/1997   left neck ([place])   MOHS SURGERY Right 2013   behind right ear   RIGHT/LEFT HEART CATH AND CORONARY ANGIOGRAPHY N/A 05/12/2018   Procedure: RIGHT/LEFT HEART CATH AND CORONARY ANGIOGRAPHY;  Surgeon: Iran Ouch, MD;  Location: [facility];  Service: Cardiovascular;  Laterality: N/A;   TONSILLECTOMY     TOTAL KNEE ARTHROPLASTY Left 01/17/2023   Procedure: LEFT TOTAL KNEE ARTHROPLASTY;  Surgeon: Cammy Copa, MD;  Location: [facility];  Service: Orthopedics;  Laterality: Left;   VASECTOMY   1982   Patient Active Problem List   Diagnosis Date Noted   Arthritis of left knee 02/03/2023   Anemia 01/30/2023   Dehydration 01/21/2023   Near syncope 01/20/2023   AKI (acute kidney injury) (HCC) 01/20/2023   Septic joint of left knee joint (HCC) 01/20/2023   Elevated lactic acid level 01/20/2023   Severe sepsis (HCC) 01/20/2023   OA (osteoarthritis) of knee 01/17/2023   S/P total knee replacement, left 01/17/2023   Pruritus 03/21/2022   Afib (HCC) 11/07/2021   Hyperglycemia 08/27/2021   Dysphagia 08/27/2021   Thoracic aortic aneurysm without rupture (HCC) 11/25/2019   Chronic heart failure with preserved ejection fraction (HFpEF) (HCC) 08/18/2018   Hyperlipidemia LDL goal <70 08/18/2018   Coronary artery disease involving native coronary artery of native heart without angina pectoris 05/16/2018   Unstable angina (HCC)    CHF (congestive heart failure) (HCC) 05/09/2018   Benign prostatic hyperplasia with nocturia 05/07/2018   Dyspnea on exertion 02/19/2018   Persistent atrial fibrillation (HCC) 02/12/2018   Cardiac murmur 02/12/2018   Pain and swelling of left knee 08/07/2017   Healthcare maintenance 05/01/2016   Hematuria 03/06/2016   Hx of adenomatous colonic polyps 12/05/2015   Advance care planning 07/21/2014   Medicare annual wellness visit, subsequent 07/01/2012   External hemorrhoids 06/27/2011   Hypothyroidism 02/24/2008   HYPERCHOLESTEROLEMIA 02/21/2007   Essential hypertension 02/21/2007   DEGENERATIVE JOINT DISEASE, CERVICAL SPINE 02/21/2007    PCP: Dr. Crawford Givens   REFERRING PROVIDER: Harriette Bouillon PA-C  REFERRING DIAG: Left TKA   THERAPY DIAG:  Left knee pain, unspecified chronicity  Rationale for Evaluation and Treatment: Rehabilitation  ONSET DATE: 01/17/23  SUBJECTIVE:   SUBJECTIVE STATEMENT: Pt reports that he is doing well and that he is not feeling much pain in knee.    PERTINENT HISTORY: Pt reports that he just finished HHPT and  he reports that it went well. His pain is well controlled and he is currently only taking two tylenols at the beginning of the day.   PAIN:  Are you having pain? No pain   PRECAUTIONS: None  RED FLAGS: None   WEIGHT BEARING RESTRICTIONS: No  FALLS:  Has patient fallen in last 6 months? No  LIVING ENVIRONMENT: Lives with: lives with their spouse Lives in: House/apartment Stairs: No Has following equipment at home: Single point cane, Environmental consultant - 2 wheeled, and shower chair  OCCUPATION: Retired   PLOF: Independent  PATIENT GOALS: Return to walking without a single point cane and hiking   NEXT MD VISIT: 02/27/23  OBJECTIVE:   VITALS: BP 119/75 HR 78 SpO2 100   DIAGNOSTIC FINDINGS: AP and lateral views of the knee reviewed.  Total knee arthroplasty  prosthesis in good position and alignment without any complicating  features.  There is no evidence of dislocation, periprosthetic fracture,  patella baja/alta   PATIENT SURVEYS:  FOTO 44/100   COGNITION: Overall cognitive status: Within functional limits for tasks assessed     SENSATION: WFL  EDEMA:  Circumferential: R/L 16"/18"  MUSCLE LENGTH: Hamstrings: Right NT deg; Left NT deg Maisie Fus test: Right NT deg; Left NT  deg  POSTURE: rounded shoulders  PALPATION: Anterior surface of left knee   LOWER EXTREMITY ROM:  Active/Passive ROM Right eval Left eval Right  Eval Left  Eval   Hip flexion      Hip extension      Hip abduction      Hip adduction      Hip internal rotation      Hip external rotation      Knee flexion 130/135 115/120 125/125 115/115  Knee extension 0/-1 4/3* 2/1 2/1  Ankle dorsiflexion      Ankle plantarflexion      Ankle inversion      Ankle eversion       (Blank rows = not tested)  LOWER EXTREMITY MMT:  MMT Right eval Left eval Right  Eval Left  Eval   Hip flexion      Hip extension      Hip abduction      Hip adduction      Hip internal rotation      Hip external rotation       Knee flexion 4+ 4+ 4+ 4+  Knee extension 4+ 3- 4+ 4+  Ankle dorsiflexion      Ankle plantarflexion      Ankle inversion      Ankle eversion       (Blank rows = not tested)   FUNCTIONAL TESTS:  Squat: Posterior knee translation with forward trunk lean   GAIT: Distance walked: 50 ft  Assistive device utilized: Single point cane Level of assistance: Modified independence Comments: Left sided antalgic gait    TODAY'S TREATMENT:                                                                                                                              DATE:   04/04/23: All single leg exercises performed on LLE TM 1.0 mph with 1 UE support 5 min  Overground Ambulation outside in parking lot  1,000 ft   -min VC to increase heel strike on LLE  Standing Hip Abduction with BUE support and #3 AW 1 x 10  Standing Hip Abduction with 1UE support and #5 AW 1 X 10   -Pt shows significant lateral trunk lean to right  Standing Hip Abduction with BUE support #5 AW 1 x 10  Long Arc Quads with #5 AW from Nu-Step Seat 3 x 10   04/02/23: All single leg exercises performed on LLE  Nu-Step Level 8, resistance 3 for 5 min  See Knee ROM listed below  See Knee Strength listed below  OMEGA Knee Extension #5 on LLE 1 x 3  -Pt is unable to full extend his knee  Seated Long Arc Quad #5 AW 3 x 15  FOTO: 56/100   Overground Ambulation 5 x 100 ft  -Left hip drop with left trunk lean for compensation   03/28/23: Matrix recumbent bicycle seat at 16 with 2 resistance for 5 min  OMEGA Knee Extension #5 1 x 10  -Pt unable to reach terminal knee extension  Seated Long Arc Quad #5 AW 3 x 10 on LLE  Step Up on 4 inch step with 1 UE support 1 x 10  Step Up on  Single Leg Stance 3 x 10 sec  -Pt unable to achieve 10 sec without UE support  Tandem Stance 2 x 10 sec   OMEGA Leg Press on LLE with #45 lbs 3 x 10   03/26/23: Nu-Step Seat and Arms at 9 for 5 min  OMEGA LE Extension on LLE #45 3 x 10  -Pt  reports increased lateral hamstring pain  Seated HS Stretch on LLE 3 x 30 sec  Mini-Squat with BUE support 1 x 10  -min VC to maintain knee behind toes  Mini-Squat with 1 UE support 2 x 10  Standing Hip Abduction with BUE support 1 x 10  Standing Hip Abduction with 1 UE support 2 x 10  -Pt reports increased lateral hamstring pain  Supine HS Stretch LLE 3 x 30 sec with PT providing overpressure   03/21/23: All single leg exercises performed on LLE  TM 1.0 mph for 5 min Heel to toe 10 m x 6  -min VC for increased heel strike and toe off    OMEGA Leg Press #25 on LLE 1 x 10 OMEGA Leg Press #45 on LLE 2 x 10   Step Up with 6 inch step with BUE support 1 x 10  Step Up with 6 inch step with 1 UE support 1 x 10  Step Up with 6 inch step with no UE support 1 x 10 -intermittent use of 1 UE  Forward Step Up on 4 inch step 1 x 10  Side Step Up on 4 inch step with 1 UE support 2 x 10  -min VC to step up with LLE and step down with RLE     PATIENT EDUCATION:  Education details: form and technique for correct performance of exercise  Person educated: Patient Education method: Explanation, Demonstration, Verbal cues, and Handouts Education comprehension: verbalized understanding, returned demonstration, and verbal cues required  HOME EXERCISE PROGRAM:  Access Code: [email or web address] URL: [email or web address] Date: 04/04/2023 Prepared by: Ellin Goodie  Exercises - Heel-Toe Walking  - 1 x daily - 10 reps - Standing Gastroc Stretch on Step  - 1 x daily - 3 reps - 30-60 sec hold - Standing Heel Raise  - 3-4 x weekly - 3 sets - 15 reps - Seated Hamstring Stretch  - 1 x daily - 3 reps - 30-60 sec  hold - Prone Quadriceps Stretch with Strap  - 1 x daily - 3 reps - 30-60 sec hold - Standing Hip Abduction with Counter Support  - 3-4 x weekly - 3 sets - 10 reps - Seated Long Arc Quad  - 3-4 x weekly - 3 sets - 10 reps - Step Up  - 3-4 x weekly - 3 sets - 10  reps  ASSESSMENT:  CLINICAL IMPRESSION: Pt progressing towards goals with first time walking on  uneven surfaces and for prolonged distance without AD during today's session. He does show deconditioning with increased shortness of breath after ambulating 1,000 ft. He continues to demonstrate decreased heel strike especially with LE fatigue. He will continue to benefit from continued skilled PT services to improve strength, function, and ability to ambulate with less difficulty.   OBJECTIVE IMPAIRMENTS: Abnormal gait, difficulty walking, decreased ROM, decreased strength, hypomobility, increased edema, impaired flexibility, obesity, and pain.   ACTIVITY LIMITATIONS: carrying, lifting, bending, standing, squatting, stairs, and caring for others  PARTICIPATION LIMITATIONS: driving, shopping, community activity, and yard work  PERSONAL FACTORS: 3+ comorbidities: h/o CHF, Afib, HLD, and hypothyroidism  are also affecting patient's functional outcome.   REHAB POTENTIAL: Good  CLINICAL DECISION MAKING: Stable/uncomplicated  EVALUATION COMPLEXITY: Low   GOALS: Goals reviewed with patient? No  SHORT TERM GOALS: Target date: 03/06/2023  Pt will be independent with HEP in order to improve strength and balance in order to decrease fall risk and improve function at home and work. Baseline: Has not attempted performing at home yet 03/14/23 Able to perform independently Goal status: ACHIEVED   2.  Patient will ambulate without use of an assistive device as a sign of improve left knee function.  Baseline: Using [facility] 04/02/23: Able to not use SPC to walk  Goal status: ACHIEVED     LONG TERM GOALS: Target date: 05/01/2023  Patient will have improved function and activity level as evidenced by an increase in FOTO score by 10 points or more.  Baseline: 44/100 with target of 63 03/14/23 59 04/02/23: 56  Goal status: Achieved   2.  Patient will improve left knee AROM to be symmetrical to right knee ROM  for improved gait mechanics and gait stability.  Baseline: Knee Ext R/L 0/4  03/14/23: Knee Ext R/L 0/4, Knee Flex R/L 124/117 04/02/23: Knee Ext R/L 2/2, Knee Flex R/L 125/115  Goal status: ONGOING   3.  Patient will improve left knee strength to by symmetrical to right knee for improved gait mechanics and gait stability  Baseline: Knee Ext R/L 3-/4+, Hip Flex, Ext, Abd NT 04/02/23:  Knee Ext R/L 4+/4+ Goal status: ACHIEVED     PLAN:  PT FREQUENCY: 1-2x/week  PT DURATION: 10 weeks  PLANNED INTERVENTIONS: Therapeutic exercises, Neuromuscular re-education, Balance training, Gait training, Patient/Family education, Joint mobilization, Joint manipulation, Stair training, DME instructions, Aquatic Therapy, Dry Needling, Cryotherapy, Moist heat, Manual therapy, and Re-evaluation  PLAN FOR NEXT SESSION: Glute med strengthening and continue quad strengthening (OMEGA Knee Ext).  Overground walking outside over uneven surfaces.   Ellin Goodie PT, DPT  [facility] [address] [place], [address] Phone: [phone]   Fax:  [phone]

## 2023-04-09 ENCOUNTER — Ambulatory Visit: Payer: Medicare Other | Attending: Surgical | Admitting: Physical Therapy

## 2023-04-09 DIAGNOSIS — M25562 Pain in left knee: Secondary | ICD-10-CM | POA: Diagnosis not present

## 2023-04-09 NOTE — Therapy (Signed)
OUTPATIENT PHYSICAL THERAPY LOWER EXTREMITY TREATMENT NOTE   Patient Name: James [name] MRN: [id number] DOB:Jun 20, 1946, 77 y.o., male Today's Date: 04/09/2023  END OF SESSION:  PT End of Session - 04/09/23 1353     Visit Number 12    Number of Visits 20    Date for PT Re-Evaluation 05/01/23    Authorization Type [id number]    Authorization - Visit Number 12    Authorization - Number of Visits 20    Progress Note Due on Visit 10    PT Start Time 1347    PT Stop Time 1430    PT Time Calculation (min) 43 min    Activity Tolerance Patient tolerated treatment well    Behavior During Therapy WFL for tasks assessed/performed                 Past Medical History:  Diagnosis Date   (HFpEF) heart failure with preserved ejection fraction (HCC)    a. 05/2018 Echo: EF 55-60%, gr2 DD.   Acute lower GI bleeding after colonoscopy and polypectomy, resolved 12/12/2015   Ascending aortic aneurysm (HCC)    a. 02/2018 Echo: mildly dil Ao root/asc ao/arch; b. 05/2018 CTA Chest: 4.7cm fusiform aneurysm of Asc Ao.   CAD (coronary artery disease)    a. 05/2018 Cath/PCI: LM nl,LAD 30p, 90/99d/apical, LCX 15m, OM1/2/3 min irregs, RCA 85p (3.5x18 [place] DES).   Cardiac murmur    a. 02/2018 Echo: EF 60-65%, no rwma, mild AI, mildly dil Ao root/Asc Ao/Arch, Mild MR, mildly dil LA. Nl RV fxn.   Cataract    bilateral surgery to remove   CHF (congestive heart failure) (HCC)    Colon polyp    Constipation    Dysrhythmia    Hx A. Fib   Essential hypertension    Hemorrhoids    Hepatitis    self resolved, likely food exposure, 1974.    History of cardiovascular stress test    a. 02/2018 Myoview: EF 55-65%, small/mild apical defect w/ nl wall motion ->attenuation artifact. No ischemia. Low risk study.   Hx of adenomatous colonic polyps 12/05/2015   Hyperlipidemia    Hypothyroidism    Mitral regurgitation    a. 110/2019 Echo: EF 55-60%. Gr2 DD. Triv AI. Ao root 38mm. Mild MR. Mod dil LA, mildly  dil RA.   Osteoarthritis    Oxygen deficiency    Persistent atrial fibrillation (HCC)    a. Dx 02/2018. CHA2DS2VASc = 2-->Eliquis; b. 03/2018 DCCV (200J); c. 03/2018 recurrent Afib-->flecainide started 04/2018;  d. 05/06/2018 s/p successful DCCV - 200J x 2-->on amio.   Skin cancer    basal cell, R ear, MOHS   Past Surgical History:  Procedure Laterality Date   BROW LIFT Bilateral 10/23/2016   Procedure: BLEPHAROPLASTY upper eyelid with excess skin;  Surgeon: Imagene Riches, MD;  Location: [facility];  Service: Ophthalmology;  Laterality: Bilateral;  MAC   CARDIOVERSION N/A 03/18/2018   Procedure: CARDIOVERSION;  Surgeon: Yvonne Kendall, MD;  Location: [facility] ORS;  Service: Cardiovascular;  Laterality: N/A;   CARDIOVERSION N/A 05/06/2018   Procedure: CARDIOVERSION;  Surgeon: Yvonne Kendall, MD;  Location: [facility];  Service: Cardiovascular;  Laterality: N/A;   CATARACT EXTRACTION  1986   OD   CATARACT EXTRACTION Bilateral 1995   x 2 for right and left    COLONOSCOPY     CORONARY STENT INTERVENTION N/A 05/13/2018   Procedure: CORONARY STENT INTERVENTION;  Surgeon: Yvonne Kendall, MD;  Location: [facility];  Service: Cardiovascular;  Laterality: N/A;   ECTROPION REPAIR Bilateral 10/23/2016   Procedure: REPAIR OF ECTROPION sutures, extensive;  Surgeon: Imagene Riches, MD;  Location: [facility];  Service: Ophthalmology;  Laterality: Bilateral;   LIPOMA EXCISION  06/1997   left neck (Juengel)   MOHS SURGERY Right 2013   behind right ear   RIGHT/LEFT HEART CATH AND CORONARY ANGIOGRAPHY N/A 05/12/2018   Procedure: RIGHT/LEFT HEART CATH AND CORONARY ANGIOGRAPHY;  Surgeon: Iran Ouch, MD;  Location: [facility] INV[facility] CV LAB;  Service: Cardiovascular;  Laterality: N/A;   TONSILLECTOMY     TOTAL KNEE ARTHROPLASTY Left 01/17/2023   Procedure: LEFT TOTAL KNEE ARTHROPLASTY;  Surgeon: Cammy Copa, MD;  Location: [facility] OR;  Service: Orthopedics;  Laterality: Left;   VASECTOMY   1982   Patient Active Problem List   Diagnosis Date Noted   Arthritis of left knee 02/03/2023   Anemia 01/30/2023   Dehydration 01/21/2023   Near syncope 01/20/2023   AKI (acute kidney injury) (HCC) 01/20/2023   Septic joint of left knee joint (HCC) 01/20/2023   Elevated lactic acid level 01/20/2023   Severe sepsis (HCC) 01/20/2023   OA (osteoarthritis) of knee 01/17/2023   S/P total knee replacement, left 01/17/2023   Pruritus 03/21/2022   Afib (HCC) 11/07/2021   Hyperglycemia 08/27/2021   Dysphagia 08/27/2021   Thoracic aortic aneurysm without rupture (HCC) 11/25/2019   Chronic heart failure with preserved ejection fraction (HFpEF) (HCC) 08/18/2018   Hyperlipidemia LDL goal <70 08/18/2018   Coronary artery disease involving native coronary artery of native heart without angina pectoris 05/16/2018   Unstable angina (HCC)    CHF (congestive heart failure) (HCC) 05/09/2018   Benign prostatic hyperplasia with nocturia 05/07/2018   Dyspnea on exertion 02/19/2018   Persistent atrial fibrillation (HCC) 02/12/2018   Cardiac murmur 02/12/2018   Pain and swelling of left knee 08/07/2017   Healthcare maintenance 05/01/2016   Hematuria 03/06/2016   Hx of adenomatous colonic polyps 12/05/2015   Advance care planning 07/21/2014   Medicare annual wellness visit, subsequent 07/01/2012   External hemorrhoids 06/27/2011   Hypothyroidism 02/24/2008   HYPERCHOLESTEROLEMIA 02/21/2007   Essential hypertension 02/21/2007   DEGENERATIVE JOINT DISEASE, CERVICAL SPINE 02/21/2007    PCP: Dr. Crawford Givens   REFERRING PROVIDER: Harriette Bouillon PA-C  REFERRING DIAG: Left TKA   THERAPY DIAG:  Left knee pain, unspecified chronicity  Rationale for Evaluation and Treatment: Rehabilitation  ONSET DATE: 01/17/23  SUBJECTIVE:   SUBJECTIVE STATEMENT: Pt reports that he continues to complete walks around neighborhood. He did feel some knee soreness after complete standing hip abduction.  Otherwise, he has not felt an increase in knee pain with home exercise plan.N  PERTINENT HISTORY: Pt reports that he just finished HHPT and he reports that it went well. His pain is well controlled and he is currently only taking two tylenols at the beginning of the day.   PAIN:  Are you having pain? 1-2 NRPS in left knee   PRECAUTIONS: None  RED FLAGS: None   WEIGHT BEARING RESTRICTIONS: No  FALLS:  Has patient fallen in last 6 months? No  LIVING ENVIRONMENT: Lives with: lives with their spouse Lives in: House/apartment Stairs: No Has following equipment at home: Single point cane, Environmental consultant - 2 wheeled, and shower chair  OCCUPATION: Retired   PLOF: Independent  PATIENT GOALS: Return to walking without a single point cane and hiking   NEXT MD VISIT: 02/27/23  OBJECTIVE:   VITALS: BP 119/75 HR 78  SpO2 100   DIAGNOSTIC FINDINGS: AP and lateral views of the knee reviewed.  Total knee arthroplasty  prosthesis in good position and alignment without any complicating  features.  There is no evidence of dislocation, periprosthetic fracture,  patella baja/alta   PATIENT SURVEYS:  FOTO 44/100   COGNITION: Overall cognitive status: Within functional limits for tasks assessed     SENSATION: WFL  EDEMA:  Circumferential: R/L 16"/18"  MUSCLE LENGTH: Hamstrings: Right NT deg; Left NT deg Maisie Fus test: Right NT deg; Left NT  deg  POSTURE: rounded shoulders  PALPATION: Anterior surface of left knee   LOWER EXTREMITY ROM:  Active/Passive ROM Right eval Left eval Right  Eval Left  Eval   Hip flexion      Hip extension      Hip abduction      Hip adduction      Hip internal rotation      Hip external rotation      Knee flexion 130/135 115/120 125/125 115/115  Knee extension 0/-1 4/3* 2/1 2/1  Ankle dorsiflexion      Ankle plantarflexion      Ankle inversion      Ankle eversion       (Blank rows = not tested)  LOWER EXTREMITY MMT:  MMT Right eval Left eval  Right  Eval Left  Eval   Hip flexion      Hip extension      Hip abduction      Hip adduction      Hip internal rotation      Hip external rotation      Knee flexion 4+ 4+ 4+ 4+  Knee extension 4+ 3- 4+ 4+  Ankle dorsiflexion      Ankle plantarflexion      Ankle inversion      Ankle eversion       (Blank rows = not tested)   FUNCTIONAL TESTS:  Squat: Posterior knee translation with forward trunk lean   GAIT: Distance walked: 50 ft  Assistive device utilized: Single point cane Level of assistance: Modified independence Comments: Left sided antalgic gait    TODAY'S TREATMENT:                                                                                                                              DATE:   04/09/23: TM 1.5 mph for 3 min with  1 UE support  TM 1.0 mph for 2 min with no UE support 10 m overground x 2 with gaze downward  10 m overground x 2 with gaze upwared  10 m overground ambulation with vertical head turns x 10  -intermittent lateral sway  10 m overground ambulation with horizontal head turns x 10  -intermittent lateral sway  10 m over ground ambulation with #10 AW x 4  Side Step Up on LLE with 1 UE support on 6 inch step 1 x 5 -Pt unable to perform without needing  significant UE support  Side Step Up on LLE with 1 UE support on 4 inch step 3 X 10  Single Leg Heel Raise on LLE with 1 UE support on 4 inch step 3 X 15  OMEGA Leg Press on LLE  #55 x 1  -Pt struggles to perform 1 rep  OMEGA Leg Press on LLE #45 x 3 -Pt unable to perform more than 3 reps  OMEGA Leg Press on LLE #35 3 x 10     04/04/23: All single leg exercises performed on LLE TM 1.0 mph with 1 UE support 5 min  Overground Ambulation outside in parking lot  1,000 ft   -min VC to increase heel strike on LLE  Standing Hip Abduction with BUE support and #3 AW 1 x 10  Standing Hip Abduction with 1UE support and #5 AW 1 X 10   -Pt shows significant lateral trunk lean to right  Standing  Hip Abduction with BUE support #5 AW 1 x 10  Long Arc Quads with #5 AW from Nu-Step Seat 3 x 10   04/02/23: All single leg exercises performed on LLE  Nu-Step Level 8, resistance 3 for 5 min  See Knee ROM listed below See Knee Strength listed below  OMEGA Knee Extension #5 on LLE 1 x 3  -Pt is unable to full extend his knee  Seated Long Arc Quad #5 AW 3 x 15  FOTO: 56/100   Overground Ambulation 5 x 100 ft  -Left hip drop with left trunk lean for compensation   03/28/23: Matrix recumbent bicycle seat at 16 with 2 resistance for 5 min  OMEGA Knee Extension #5 1 x 10  -Pt unable to reach terminal knee extension  Seated Long Arc Quad #5 AW 3 x 10 on LLE  Step Up on 4 inch step with 1 UE support 1 x 10  Step Up on  Single Leg Stance 3 x 10 sec  -Pt unable to achieve 10 sec without UE support  Tandem Stance 2 x 10 sec   OMEGA Leg Press on LLE with #45 lbs 3 x 10   03/26/23: Nu-Step Seat and Arms at 9 for 5 min  OMEGA LE Extension on LLE #45 3 x 10  -Pt reports increased lateral hamstring pain  Seated HS Stretch on LLE 3 x 30 sec  Mini-Squat with BUE support 1 x 10  -min VC to maintain knee behind toes  Mini-Squat with 1 UE support 2 x 10  Standing Hip Abduction with BUE support 1 x 10  Standing Hip Abduction with 1 UE support 2 x 10  -Pt reports increased lateral hamstring pain  Supine HS Stretch LLE 3 x 30 sec with PT providing overpressure    PATIENT EDUCATION:  Education details: form and technique for correct performance of exercise  Person educated: Patient Education method: Explanation, Demonstration, Verbal cues, and Handouts Education comprehension: verbalized understanding, returned demonstration, and verbal cues required  HOME EXERCISE PROGRAM:  Access Code: [email or web address] URL: [email or web address] Date: 04/04/2023 Prepared by: Ellin Goodie  Exercises - Heel-Toe Walking  - 1 x daily - 10 reps - Standing Gastroc Stretch on Step  - 1 x daily - 3 reps  - 30-60 sec hold - Standing Heel Raise  - 3-4 x weekly - 3 sets - 15 reps - Seated Hamstring Stretch  - 1 x daily - 3 reps - 30-60 sec  hold - Prone Quadriceps Stretch with Strap  - 1 x daily - 3  reps - 30-60 sec hold - Standing Hip Abduction with Counter Support  - 3-4 x weekly - 3 sets - 10 reps - Seated Long Arc Quad  - 3-4 x weekly - 3 sets - 10 reps - Step Up  - 3-4 x weekly - 3 sets - 10 reps  ASSESSMENT:  CLINICAL IMPRESSION: Pt continues to show decreased dynamic balance with increased lateral sway especially with head turns which is likely due to left knee instability. Dynamic balance improved with repetition. Pt did demonstrate a regression in left glute and quad strength with need to perform leg press at a lower weight. This is likely due to LLE fatigue with pt performing this exercise at the end of session, and not a true indication of muscular weakness. He will continue to benefit from continued skilled PT services to improve strength, function, and ability to ambulate with less difficulty.   OBJECTIVE IMPAIRMENTS: Abnormal gait, difficulty walking, decreased ROM, decreased strength, hypomobility, increased edema, impaired flexibility, obesity, and pain.   ACTIVITY LIMITATIONS: carrying, lifting, bending, standing, squatting, stairs, and caring for others  PARTICIPATION LIMITATIONS: driving, shopping, community activity, and yard work  PERSONAL FACTORS: 3+ comorbidities: h/o CHF, Afib, HLD, and hypothyroidism  are also affecting patient's functional outcome.   REHAB POTENTIAL: Good  CLINICAL DECISION MAKING: Stable/uncomplicated  EVALUATION COMPLEXITY: Low   GOALS: Goals reviewed with patient? No  SHORT TERM GOALS: Target date: 03/06/2023  Pt will be independent with HEP in order to improve strength and balance in order to decrease fall risk and improve function at home and work. Baseline: Has not attempted performing at home yet 03/14/23 Able to perform  independently Goal status: ACHIEVED   2.  Patient will ambulate without use of an assistive device as a sign of improve left knee function.  Baseline: Using [facility] 04/02/23: Able to not use SPC to walk  Goal status: ACHIEVED     LONG TERM GOALS: Target date: 05/01/2023  Patient will have improved function and activity level as evidenced by an increase in FOTO score by 10 points or more.  Baseline: 44/100 with target of 63 03/14/23 59 04/02/23: 56  Goal status: Achieved   2.  Patient will improve left knee AROM to be symmetrical to right knee ROM for improved gait mechanics and gait stability.  Baseline: Knee Ext R/L 0/4  03/14/23: Knee Ext R/L 0/4, Knee Flex R/L 124/117 04/02/23: Knee Ext R/L 2/2, Knee Flex R/L 125/115  Goal status: ONGOING   3.  Patient will improve left knee strength to by symmetrical to right knee for improved gait mechanics and gait stability  Baseline: Knee Ext R/L 3-/4+, Hip Flex, Ext, Abd NT 04/02/23:  Knee Ext R/L 4+/4+ Goal status: ACHIEVED     PLAN:  PT FREQUENCY: 1-2x/week  PT DURATION: 10 weeks  PLANNED INTERVENTIONS: Therapeutic exercises, Neuromuscular re-education, Balance training, Gait training, Patient/Family education, Joint mobilization, Joint manipulation, Stair training, DME instructions, Aquatic Therapy, Dry Needling, Cryotherapy, Moist heat, Manual therapy, and Re-evaluation  PLAN FOR NEXT SESSION: Warm-up outside walking outside. Glute med strengthening and continue quad strengthening (OMEGA Knee Ext).    Ellin Goodie PT, DPT  [facility] [address] [place], [address] Phone: [phone]   Fax:  [phone]

## 2023-04-10 ENCOUNTER — Ambulatory Visit (INDEPENDENT_AMBULATORY_CARE_PROVIDER_SITE_OTHER): Payer: Medicare Other | Admitting: Orthopedic Surgery

## 2023-04-10 DIAGNOSIS — Z96652 Presence of left artificial knee joint: Secondary | ICD-10-CM

## 2023-04-10 NOTE — Progress Notes (Unsigned)
Post-Op Visit Note   Patient: James [name]           Date of Birth: July 12, 1946           MRN: [id number] Visit Date: 04/10/2023 PCP: Joaquim Nam, MD   Assessment & Plan:  Chief Complaint:  Chief Complaint  Patient presents with   Left Knee - Routine Post Op    left total knee replacement 01/17/2023.   Visit Diagnoses:  1. S/P total knee arthroplasty, left     Plan: James [name] is a 77 year old patient who is now about 10 weeks out left total knee replacement.  Ambulating with a cane on occasion.  Overall doing very well by his report.  No issues that he is aware of.  On examination he has range of motion 2-1 15.  Extensor mechanism intact patella mobility is good.  No calf tenderness negative Homans.  He is going to continue with therapy to work on gait training.  He is having some balance issues.  Not limited in his activity.  He will follow-up with [place] as needed.  Discussed antibiotic prophylaxis for procedures the first year after surgery.  Follow-Up Instructions: No follow-ups on file.   Orders:  No orders of the defined types were placed in this encounter.  No orders of the defined types were placed in this encounter.   Imaging: No results found.  PMFS History: Patient Active Problem List   Diagnosis Date Noted   Arthritis of left knee 02/03/2023   Anemia 01/30/2023   Dehydration 01/21/2023   Near syncope 01/20/2023   AKI (acute kidney injury) (HCC) 01/20/2023   Septic joint of left knee joint (HCC) 01/20/2023   Elevated lactic acid level 01/20/2023   Severe sepsis (HCC) 01/20/2023   OA (osteoarthritis) of knee 01/17/2023   S/P total knee replacement, left 01/17/2023   Pruritus 03/21/2022   Afib (HCC) 11/07/2021   Hyperglycemia 08/27/2021   Dysphagia 08/27/2021   Thoracic aortic aneurysm without rupture (HCC) 11/25/2019   Chronic heart failure with preserved ejection fraction (HFpEF) (HCC) 08/18/2018   Hyperlipidemia LDL goal <70 08/18/2018   Coronary artery  disease involving native coronary artery of native heart without angina pectoris 05/16/2018   Unstable angina (HCC)    CHF (congestive heart failure) (HCC) 05/09/2018   Benign prostatic hyperplasia with nocturia 05/07/2018   Dyspnea on exertion 02/19/2018   Persistent atrial fibrillation (HCC) 02/12/2018   Cardiac murmur 02/12/2018   Pain and swelling of left knee 08/07/2017   Healthcare maintenance 05/01/2016   Hematuria 03/06/2016   Hx of adenomatous colonic polyps 12/05/2015   Advance care planning 07/21/2014   Medicare annual wellness visit, subsequent 07/01/2012   External hemorrhoids 06/27/2011   Hypothyroidism 02/24/2008   HYPERCHOLESTEROLEMIA 02/21/2007   Essential hypertension 02/21/2007   DEGENERATIVE JOINT DISEASE, CERVICAL SPINE 02/21/2007   Past Medical History:  Diagnosis Date   (HFpEF) heart failure with preserved ejection fraction (HCC)    a. 05/2018 Echo: EF 55-60%, gr2 DD.   Acute lower GI bleeding after colonoscopy and polypectomy, resolved 12/12/2015   Ascending aortic aneurysm (HCC)    a. 02/2018 Echo: mildly dil Ao root/asc ao/arch; b. 05/2018 CTA Chest: 4.7cm fusiform aneurysm of Asc Ao.   CAD (coronary artery disease)    a. 05/2018 Cath/PCI: LM nl,LAD 30p, 90/99d/apical, LCX 77m, OM1/2/3 min irregs, RCA 85p (3.5x18 [place] DES).   Cardiac murmur    a. 02/2018 Echo: EF 60-65%, no rwma, mild AI, mildly dil Ao root/Asc Ao/Arch, Mild  MR, mildly dil LA. Nl RV fxn.   Cataract    bilateral surgery to remove   CHF (congestive heart failure) (HCC)    Colon polyp    Constipation    Dysrhythmia    Hx A. Fib   Essential hypertension    Hemorrhoids    Hepatitis    self resolved, likely food exposure, 1974.    History of cardiovascular stress test    a. 02/2018 Myoview: EF 55-65%, small/mild apical defect w/ nl wall motion ->attenuation artifact. No ischemia. Low risk study.   Hx of adenomatous colonic polyps 12/05/2015   Hyperlipidemia    Hypothyroidism    Mitral  regurgitation    a. 110/2019 Echo: EF 55-60%. Gr2 DD. Triv AI. Ao root 38mm. Mild MR. Mod dil LA, mildly dil RA.   Osteoarthritis    Oxygen deficiency    Persistent atrial fibrillation (HCC)    a. Dx 02/2018. CHA2DS2VASc = 2-->Eliquis; b. 03/2018 DCCV (200J); c. 03/2018 recurrent Afib-->flecainide started 04/2018;  d. 05/06/2018 s/p successful DCCV - 200J x 2-->on amio.   Skin cancer    basal cell, R ear, MOHS    Family History  Problem Relation Age of Onset   Stroke Mother    Lung cancer Maternal Grandfather        smoker   Stroke Maternal Grandfather    Heart disease Paternal Grandfather        MI, old age   Colon cancer Neg Hx    Colon polyps Neg Hx    Esophageal cancer Neg Hx    Rectal cancer Neg Hx    Stomach cancer Neg Hx    Bladder Cancer Neg Hx    Prostate cancer Neg Hx     Past Surgical History:  Procedure Laterality Date   BROW LIFT Bilateral 10/23/2016   Procedure: BLEPHAROPLASTY upper eyelid with excess skin;  Surgeon: Imagene Riches, MD;  Location: [facility];  Service: Ophthalmology;  Laterality: Bilateral;  MAC   CARDIOVERSION N/A 03/18/2018   Procedure: CARDIOVERSION;  Surgeon: Yvonne Kendall, MD;  Location: [facility] OR[facility];  Service: Cardiovascular;  Laterality: N/A;   CARDIOVERSION N/A 05/06/2018   Procedure: CARDIOVERSION;  Surgeon: Yvonne Kendall, MD;  Location: [facility];  Service: Cardiovascular;  Laterality: N/A;   CATARACT EXTRACTION  1986   OD   CATARACT EXTRACTION Bilateral 1995   x 2 for right and left    COLONOSCOPY     CORONARY STENT INTERVENTION N/A 05/13/2018   Procedure: CORONARY STENT INTERVENTION;  Surgeon: Yvonne Kendall, MD;  Location: [facility];  Service: Cardiovascular;  Laterality: N/A;   ECTROPION REPAIR Bilateral 10/23/2016   Procedure: REPAIR OF ECTROPION sutures, extensive;  Surgeon: Imagene Riches, MD;  Location: [facility];  Service: Ophthalmology;  Laterality: Bilateral;   LIPOMA EXCISION  06/1997   left neck  (Juengel)   MOHS SURGERY Right 2013   behind right ear   RIGHT/LEFT HEART CATH AND CORONARY ANGIOGRAPHY N/A 05/12/2018   Procedure: RIGHT/LEFT HEART CATH AND CORONARY ANGIOGRAPHY;  Surgeon: Iran Ouch, MD;  Location: [facility];  Service: Cardiovascular;  Laterality: N/A;   TONSILLECTOMY     TOTAL KNEE ARTHROPLASTY Left 01/17/2023   Procedure: LEFT TOTAL KNEE ARTHROPLASTY;  Surgeon: Cammy Copa, MD;  Location: [facility] OR;  Service: Orthopedics;  Laterality: Left;   VASECTOMY  1982   Social History   Occupational History   Occupation: Retired    Associate Professor: RETIRED    Comment: 1  Tobacco Use   Smoking status: Former    Current packs/day: 0.00    Average packs/day: 2.0 packs/day for 20.0 years (40.0 ttl pk-yrs)    Types: Cigarettes    Start date: 08/07/1963    Quit date: 08/07/1983    Years since quitting: 39.7    Passive exposure: Past   Smokeless tobacco: Never   Tobacco comments:    Former smoker 11/17/21 quit over 40 years ago  Vaping Use   Vaping status: Never Used  Substance and Sexual Activity   Alcohol use: Yes    Comment: 1-2 per day   Drug use: No   Sexual activity: Yes    Partners: Female

## 2023-04-11 ENCOUNTER — Encounter: Payer: Self-pay | Admitting: Orthopedic Surgery

## 2023-04-11 ENCOUNTER — Ambulatory Visit: Payer: Medicare Other | Admitting: Physical Therapy

## 2023-04-11 DIAGNOSIS — M25562 Pain in left knee: Secondary | ICD-10-CM

## 2023-04-11 NOTE — Therapy (Signed)
OUTPATIENT PHYSICAL THERAPY LOWER EXTREMITY DISCHARGE NOTE   Patient Name: James [name] MRN: [id number] DOB:23-Apr-1946, 77 y.o., male Today's Date: 04/11/2023  END OF SESSION:  PT End of Session - 04/11/23 1354     Visit Number 13    Number of Visits 20    Date for PT Re-Evaluation 05/01/23    Authorization Type [id number]    Authorization - Visit Number 13    Authorization - Number of Visits 20    Progress Note Due on Visit 15    PT Start Time 1350    PT Stop Time 1430    PT Time Calculation (min) 40 min    Activity Tolerance Patient tolerated treatment well    Behavior During Therapy WFL for tasks assessed/performed                  Past Medical History:  Diagnosis Date   (HFpEF) heart failure with preserved ejection fraction (HCC)    a. 05/2018 Echo: EF 55-60%, gr2 DD.   Acute lower GI bleeding after colonoscopy and polypectomy, resolved 12/12/2015   Ascending aortic aneurysm (HCC)    a. 02/2018 Echo: mildly dil Ao root/asc ao/arch; b. 05/2018 CTA Chest: 4.7cm fusiform aneurysm of Asc Ao.   CAD (coronary artery disease)    a. 05/2018 Cath/PCI: LM nl,LAD 30p, 90/99d/apical, LCX 71m, OM1/2/3 min irregs, RCA 85p (3.5x18 [place] DES).   Cardiac murmur    a. 02/2018 Echo: EF 60-65%, no rwma, mild AI, mildly dil Ao root/Asc Ao/Arch, Mild MR, mildly dil LA. Nl RV fxn.   Cataract    bilateral surgery to remove   CHF (congestive heart failure) (HCC)    Colon polyp    Constipation    Dysrhythmia    Hx A. Fib   Essential hypertension    Hemorrhoids    Hepatitis    self resolved, likely food exposure, 1974.    History of cardiovascular stress test    a. 02/2018 Myoview: EF 55-65%, small/mild apical defect w/ nl wall motion ->attenuation artifact. No ischemia. Low risk study.   Hx of adenomatous colonic polyps 12/05/2015   Hyperlipidemia    Hypothyroidism    Mitral regurgitation    a. 110/2019 Echo: EF 55-60%. Gr2 DD. Triv AI. Ao root 38mm. Mild MR. Mod dil LA, mildly  dil RA.   Osteoarthritis    Oxygen deficiency    Persistent atrial fibrillation (HCC)    a. Dx 02/2018. CHA2DS2VASc = 2-->Eliquis; b. 03/2018 DCCV (200J); c. 03/2018 recurrent Afib-->flecainide started 04/2018;  d. 05/06/2018 s/p successful DCCV - 200J x 2-->on amio.   Skin cancer    basal cell, R ear, MOHS   Past Surgical History:  Procedure Laterality Date   BROW LIFT Bilateral 10/23/2016   Procedure: BLEPHAROPLASTY upper eyelid with excess skin;  Surgeon: Imagene Riches, MD;  Location: [facility];  Service: Ophthalmology;  Laterality: Bilateral;  MAC   CARDIOVERSION N/A 03/18/2018   Procedure: CARDIOVERSION;  Surgeon: Yvonne Kendall, MD;  Location: [facility];  Service: Cardiovascular;  Laterality: N/A;   CARDIOVERSION N/A 05/06/2018   Procedure: CARDIOVERSION;  Surgeon: Yvonne Kendall, MD;  Location: [facility];  Service: Cardiovascular;  Laterality: N/A;   CATARACT EXTRACTION  1986   OD   CATARACT EXTRACTION Bilateral 1995   x 2 for right and left    COLONOSCOPY     CORONARY STENT INTERVENTION N/A 05/13/2018   Procedure: CORONARY STENT INTERVENTION;  Surgeon: Yvonne Kendall, MD;  Location: [facility];  Service: Cardiovascular;  Laterality: N/A;   ECTROPION REPAIR Bilateral 10/23/2016   Procedure: REPAIR OF ECTROPION sutures, extensive;  Surgeon: Imagene Riches, MD;  Location: [facility];  Service: Ophthalmology;  Laterality: Bilateral;   LIPOMA EXCISION  06/1997   left neck ([place])   MOHS SURGERY Right 2013   behind right ear   RIGHT/LEFT HEART CATH AND CORONARY ANGIOGRAPHY N/A 05/12/2018   Procedure: RIGHT/LEFT HEART CATH AND CORONARY ANGIOGRAPHY;  Surgeon: Iran Ouch, MD;  Location: [facility];  Service: Cardiovascular;  Laterality: N/A;   TONSILLECTOMY     TOTAL KNEE ARTHROPLASTY Left 01/17/2023   Procedure: LEFT TOTAL KNEE ARTHROPLASTY;  Surgeon: Cammy Copa, MD;  Location: [facility];  Service: Orthopedics;  Laterality: Left;   VASECTOMY   1982   Patient Active Problem List   Diagnosis Date Noted   Arthritis of left knee 02/03/2023   Anemia 01/30/2023   Dehydration 01/21/2023   Near syncope 01/20/2023   AKI (acute kidney injury) (HCC) 01/20/2023   Septic joint of left knee joint (HCC) 01/20/2023   Elevated lactic acid level 01/20/2023   Severe sepsis (HCC) 01/20/2023   OA (osteoarthritis) of knee 01/17/2023   S/P total knee replacement, left 01/17/2023   Pruritus 03/21/2022   Afib (HCC) 11/07/2021   Hyperglycemia 08/27/2021   Dysphagia 08/27/2021   Thoracic aortic aneurysm without rupture (HCC) 11/25/2019   Chronic heart failure with preserved ejection fraction (HFpEF) (HCC) 08/18/2018   Hyperlipidemia LDL goal <70 08/18/2018   Coronary artery disease involving native coronary artery of native heart without angina pectoris 05/16/2018   Unstable angina (HCC)    CHF (congestive heart failure) (HCC) 05/09/2018   Benign prostatic hyperplasia with nocturia 05/07/2018   Dyspnea on exertion 02/19/2018   Persistent atrial fibrillation (HCC) 02/12/2018   Cardiac murmur 02/12/2018   Pain and swelling of left knee 08/07/2017   Healthcare maintenance 05/01/2016   Hematuria 03/06/2016   Hx of adenomatous colonic polyps 12/05/2015   Advance care planning 07/21/2014   Medicare annual wellness visit, subsequent 07/01/2012   External hemorrhoids 06/27/2011   Hypothyroidism 02/24/2008   HYPERCHOLESTEROLEMIA 02/21/2007   Essential hypertension 02/21/2007   DEGENERATIVE JOINT DISEASE, CERVICAL SPINE 02/21/2007    PCP: Dr. Crawford Givens   REFERRING PROVIDER: Harriette Bouillon PA-C  REFERRING DIAG: Left TKA   THERAPY DIAG:  Left knee pain, unspecified chronicity  Rationale for Evaluation and Treatment: Rehabilitation  ONSET DATE: 01/17/23  SUBJECTIVE:   SUBJECTIVE STATEMENT: Pt states that he has been busy as of late. He saw orthopedist who said he is doing well and that he does not need to come back to see him.    PERTINENT HISTORY: Pt reports that he just finished HHPT and he reports that it went well. His pain is well controlled and he is currently only taking two tylenols at the beginning of the day.   PAIN:  Are you having pain? 1-2 NRPS in left knee   PRECAUTIONS: None  RED FLAGS: None   WEIGHT BEARING RESTRICTIONS: No  FALLS:  Has patient fallen in last 6 months? No  LIVING ENVIRONMENT: Lives with: lives with their spouse Lives in: House/apartment Stairs: No Has following equipment at home: Single point cane, Environmental consultant - 2 wheeled, and shower chair  OCCUPATION: Retired   PLOF: Independent  PATIENT GOALS: Return to walking without a single point cane and hiking   NEXT MD VISIT: 02/27/23  OBJECTIVE:   VITALS: BP 119/75 HR 78 SpO2  100   DIAGNOSTIC FINDINGS: AP and lateral views of the knee reviewed.  Total knee arthroplasty  prosthesis in good position and alignment without any complicating  features.  There is no evidence of dislocation, periprosthetic fracture,  patella baja/alta   PATIENT SURVEYS:  FOTO 44/100   COGNITION: Overall cognitive status: Within functional limits for tasks assessed     SENSATION: WFL  EDEMA:  Circumferential: R/L 16"/18"  MUSCLE LENGTH: Hamstrings: Right NT deg; Left NT deg Maisie Fus test: Right NT deg; Left NT  deg  POSTURE: rounded shoulders  PALPATION: Anterior surface of left knee   LOWER EXTREMITY ROM:  Active/Passive ROM Right eval Left eval Right  Eval Left  Eval   Hip flexion      Hip extension      Hip abduction      Hip adduction      Hip internal rotation      Hip external rotation      Knee flexion 130/135 115/120 125/125 115/115  Knee extension 0/-1 4/3* 2/1 2/1  Ankle dorsiflexion      Ankle plantarflexion      Ankle inversion      Ankle eversion       (Blank rows = not tested)  LOWER EXTREMITY MMT:  MMT Right eval Left eval Right  Eval Left  Eval   Hip flexion      Hip extension      Hip  abduction      Hip adduction      Hip internal rotation      Hip external rotation      Knee flexion 4+ 4+ 4+ 4+  Knee extension 4+ 3- 4+ 4+  Ankle dorsiflexion      Ankle plantarflexion      Ankle inversion      Ankle eversion       (Blank rows = not tested)   FUNCTIONAL TESTS:  Squat: Posterior knee translation with forward trunk lean   GAIT: Distance walked: 50 ft  Assistive device utilized: Single point cane Level of assistance: Modified independence Comments: Left sided antalgic gait    TODAY'S TREATMENT:                                                                                                                              DATE:   04/11/23: TM with BUE support at 1.0 mph for 5 min  Sit to Stand from 18 inch seat 1 x 10  Standing Hip Abduction with BUE support 2 x 10  -min VC to increase upright posture  Standing Heel Raises with 1 UE support 2 X 10      04/09/23: TM 1.5 mph for 3 min with  1 UE support  TM 1.0 mph for 2 min with no UE support 10 m overground x 2 with gaze downward  10 m overground x 2 with gaze upwared  10 m overground ambulation with vertical head turns x  10  -intermittent lateral sway  10 m overground ambulation with horizontal head turns x 10  -intermittent lateral sway  10 m over ground ambulation with #10 AW x 4  Side Step Up on LLE with 1 UE support on 6 inch step 1 x 5 -Pt unable to perform without needing significant UE support  Side Step Up on LLE with 1 UE support on 4 inch step 3 X 10  Single Leg Heel Raise on LLE with 1 UE support on 4 inch step 3 X 15  OMEGA Leg Press on LLE  #55 x 1  -Pt struggles to perform 1 rep  OMEGA Leg Press on LLE #45 x 3 -Pt unable to perform more than 3 reps  OMEGA Leg Press on LLE #35 3 x 10     04/04/23: All single leg exercises performed on LLE TM 1.0 mph with 1 UE support 5 min  Overground Ambulation outside in parking lot  1,000 ft   -min VC to increase heel strike on LLE  Standing Hip  Abduction with BUE support and #3 AW 1 x 10  Standing Hip Abduction with 1UE support and #5 AW 1 X 10   -Pt shows significant lateral trunk lean to right  Standing Hip Abduction with BUE support #5 AW 1 x 10  Long Arc Quads with #5 AW from Nu-Step Seat 3 x 10   04/02/23: All single leg exercises performed on LLE  Nu-Step Level 8, resistance 3 for 5 min  See Knee ROM listed below See Knee Strength listed below  OMEGA Knee Extension #5 on LLE 1 x 3  -Pt is unable to full extend his knee  Seated Long Arc Quad #5 AW 3 x 15  FOTO: 56/100   Overground Ambulation 5 x 100 ft  -Left hip drop with left trunk lean for compensation   03/28/23: Matrix recumbent bicycle seat at 16 with 2 resistance for 5 min  OMEGA Knee Extension #5 1 x 10  -Pt unable to reach terminal knee extension  Seated Long Arc Quad #5 AW 3 x 10 on LLE  Step Up on 4 inch step with 1 UE support 1 x 10  Step Up on  Single Leg Stance 3 x 10 sec  -Pt unable to achieve 10 sec without UE support  Tandem Stance 2 x 10 sec   OMEGA Leg Press on LLE with #45 lbs 3 x 10   03/26/23: Nu-Step Seat and Arms at 9 for 5 min  OMEGA LE Extension on LLE #45 3 x 10  -Pt reports increased lateral hamstring pain  Seated HS Stretch on LLE 3 x 30 sec  Mini-Squat with BUE support 1 x 10  -min VC to maintain knee behind toes  Mini-Squat with 1 UE support 2 x 10  Standing Hip Abduction with BUE support 1 x 10  Standing Hip Abduction with 1 UE support 2 x 10  -Pt reports increased lateral hamstring pain  Supine HS Stretch LLE 3 x 30 sec with PT providing overpressure    PATIENT EDUCATION:  Education details: form and technique for correct performance of exercise  Person educated: Patient Education method: Explanation, Demonstration, Verbal cues, and Handouts Education comprehension: verbalized understanding, returned demonstration, and verbal cues required  HOME EXERCISE PROGRAM:  Access Code: [email or web address] URL:  [email or web address] Date: 04/11/2023 Prepared by: Ellin Goodie  Exercises - Heel-Toe Walking  - 1 x daily - 10 reps - Standing Gastroc Stretch on Step  - 1  x daily - 3 reps - 30-60 sec hold - Seated Hamstring Stretch  - 1 x daily - 3 reps - 30-60 sec  hold - Prone Quadriceps Stretch with Strap  - 1 x daily - 3 reps - 30-60 sec hold - Seated Long Arc Quad  - 7 x weekly - 3 sets - 15 reps - Sit to Stand with Armchair  - 3-4 x weekly - 3 sets - 10 reps - Standing Hip Abduction with Counter Support  - 3-4 x weekly - 3 sets - 10 reps - Standing Heel Raise with Support  - 3-4 x weekly - 3 sets - 10 reps  ASSESSMENT:  CLINICAL IMPRESSION: Pt has now nearly met all his rehab goals and he has now returned to walking. His left knee is still not symmetrical to right knee but this has not affected him regaining his gait. He has been educated on progressions for home exercise plan and he will continue to pursue plan independently to maintain functional gains.  OBJECTIVE IMPAIRMENTS: Abnormal gait, difficulty walking, decreased ROM, decreased strength, hypomobility, increased edema, impaired flexibility, obesity, and pain.   ACTIVITY LIMITATIONS: carrying, lifting, bending, standing, squatting, stairs, and caring for others  PARTICIPATION LIMITATIONS: driving, shopping, community activity, and yard work  PERSONAL FACTORS: 3+ comorbidities: h/o CHF, Afib, HLD, and hypothyroidism  are also affecting patient's functional outcome.   REHAB POTENTIAL: Good  CLINICAL DECISION MAKING: Stable/uncomplicated  EVALUATION COMPLEXITY: Low   GOALS: Goals reviewed with patient? No  SHORT TERM GOALS: Target date: 03/06/2023  Pt will be independent with HEP in order to improve strength and balance in order to decrease fall risk and improve function at home and work. Baseline: Has not attempted performing at home yet 03/14/23 Able to perform independently Goal status: ACHIEVED   2.  Patient  will ambulate without use of an assistive device as a sign of improve left knee function.  Baseline: Using [facility] 04/02/23: Able to not use SPC to walk  Goal status: ACHIEVED     LONG TERM GOALS: Target date: 05/01/2023  Patient will have improved function and activity level as evidenced by an increase in FOTO score by 10 points or more.  Baseline: 44/100 with target of 63 03/14/23 59 04/02/23: 56  Goal status: Achieved   2.  Patient will improve left knee AROM to be symmetrical to right knee ROM for improved gait mechanics and gait stability.  Baseline: Knee Ext R/L 0/4  03/14/23: Knee Ext R/L 0/4, Knee Flex R/L 124/117 04/02/23: Knee Ext R/L 2/2, Knee Flex R/L 125/115  Goal status: NOT MET   3.  Patient will improve left knee strength to by symmetrical to right knee for improved gait mechanics and gait stability  Baseline: Knee Ext R/L 3-/4+, Hip Flex, Ext, Abd NT 04/02/23:  Knee Ext R/L 4+/4+ Goal status: ACHIEVED     PLAN:  PT FREQUENCY: 1-2x/week  PT DURATION: 10 weeks  PLANNED INTERVENTIONS: Therapeutic exercises, Neuromuscular re-education, Balance training, Gait training, Patient/Family education, Joint mobilization, Joint manipulation, Stair training, DME instructions, Aquatic Therapy, Dry Needling, Cryotherapy, Moist heat, Manual therapy, and Re-evaluation  PLAN FOR NEXT SESSION: Discharge from PT   Ellin Goodie PT, DPT  [facility] [address] [place], [address] Phone: [phone]   Fax:  [phone]

## 2023-04-16 ENCOUNTER — Ambulatory Visit: Payer: Medicare Other | Attending: Cardiology

## 2023-04-16 ENCOUNTER — Encounter: Payer: Medicare Other | Admitting: Physical Therapy

## 2023-04-16 DIAGNOSIS — R600 Localized edema: Secondary | ICD-10-CM

## 2023-04-16 LAB — ECHOCARDIOGRAM COMPLETE: Area-P 1/2: 2.95 cm2

## 2023-04-16 MED ORDER — PERFLUTREN LIPID MICROSPHERE
1.0000 mL | INTRAVENOUS | Status: AC | PRN
Start: 2023-04-16 — End: 2023-04-16
  Administered 2023-04-16: 3 mL via INTRAVENOUS

## 2023-04-18 ENCOUNTER — Encounter: Payer: Medicare Other | Admitting: Physical Therapy

## 2023-04-23 ENCOUNTER — Encounter: Payer: Medicare Other | Admitting: Physical Therapy

## 2023-04-25 ENCOUNTER — Encounter: Payer: Medicare Other | Admitting: Physical Therapy

## 2023-04-25 ENCOUNTER — Other Ambulatory Visit: Payer: Self-pay | Admitting: Internal Medicine

## 2023-04-30 ENCOUNTER — Encounter: Payer: Medicare Other | Admitting: Physical Therapy

## 2023-05-02 ENCOUNTER — Encounter: Payer: Medicare Other | Admitting: Physical Therapy

## 2023-05-03 ENCOUNTER — Other Ambulatory Visit (HOSPITAL_COMMUNITY): Payer: Self-pay | Admitting: *Deleted

## 2023-05-03 MED ORDER — DOFETILIDE 500 MCG PO CAPS: 500.0000 ug | ORAL_CAPSULE | Freq: Two times a day (BID) | ORAL | 2 refills | Status: DC

## 2023-05-07 DIAGNOSIS — K080 Exfoliation of teeth due to systemic causes: Secondary | ICD-10-CM | POA: Diagnosis not present

## 2023-05-13 ENCOUNTER — Other Ambulatory Visit (INDEPENDENT_AMBULATORY_CARE_PROVIDER_SITE_OTHER): Payer: Medicare Other

## 2023-05-13 DIAGNOSIS — E039 Hypothyroidism, unspecified: Secondary | ICD-10-CM | POA: Diagnosis not present

## 2023-05-13 LAB — TSH: TSH: 0.7 u[IU]/mL (ref 0.35–5.50)

## 2023-05-17 NOTE — Progress Notes (Signed)
Cardiology Office Note Date:  05/20/2023  Patient ID:  James [name], James [name] November 16, 1945, MRN [id number] PCP:  Joaquim Nam, MD  Cardiologist:  Yvonne Kendall, MD Electrophysiologist: Lanier Prude, MD     Chief Complaint: afib follow-up  History of Present Illness: QUOC KASEMAN is a 77 y.o. male with PMH notable for persis AFib, HFpEF, CAD s/p PCI, HTN, hypothyroid; seen today for Lanier Prude, MD for routine electrophysiology followup.  He last saw Dr. Lalla Brothers 12/2021, has seen AFib clinic and Dr. Okey Dupre in interim. He has been on [place] for rhythm mgmt.  I saw him 03/2023 for acute visit d/t palpitations. Was having increased DOE and heart "beating hard in chest". EKG during visit with SB. Also having increased lower extremity edema. Updated echo ordered. BNP elevated, subclinical hypothyroid.   On follow-up today, he is overall doing well. He has continued to heal from knee surgery and is almost back to his baseline level of activity, no longer using cane. He has had one brief AFib episode last week for a few hours in the morning. Had some slight SOB during episode, but not profound. Also had awareness of heart beating in chest. No chest pain during episodes.  Diligently takes eliquis and tikosyn BID, no bleeding concerns. No missed doses.  He denies s/s of OSA - no shoring, wakes up very refreshed, no morning HA.  He monitors his HR during activity and exercising, and it rises with activity. No SOB or increased fatigue during activity or at rest.    AAD History: Amiodarone, stopped to limit off-target effects Tikosyn, started 11/2021  Past Medical History:  Diagnosis Date   (HFpEF) heart failure with preserved ejection fraction (HCC)    a. 05/2018 Echo: EF 55-60%, gr2 DD.   Acute lower GI bleeding after colonoscopy and polypectomy, resolved 12/12/2015   Ascending aortic aneurysm (HCC)    a. 02/2018 Echo: mildly dil Ao root/asc ao/arch; b. 05/2018 CTA Chest: 4.7cm  fusiform aneurysm of Asc Ao.   CAD (coronary artery disease)    a. 05/2018 Cath/PCI: LM nl,LAD 30p, 90/99d/apical, LCX 44m, OM1/2/3 min irregs, RCA 85p (3.5x18 [place] DES).   Cardiac murmur    a. 02/2018 Echo: EF 60-65%, no rwma, mild AI, mildly dil Ao root/Asc Ao/Arch, Mild MR, mildly dil LA. Nl RV fxn.   Cataract    bilateral surgery to remove   CHF (congestive heart failure) (HCC)    Colon polyp    Constipation    Dysrhythmia    Hx A. Fib   Essential hypertension    Hemorrhoids    Hepatitis    self resolved, likely food exposure, 1974.    History of cardiovascular stress test    a. 02/2018 Myoview: EF 55-65%, small/mild apical defect w/ nl wall motion ->attenuation artifact. No ischemia. Low risk study.   Hx of adenomatous colonic polyps 12/05/2015   Hyperlipidemia    Hypothyroidism    Mitral regurgitation    a. 110/2019 Echo: EF 55-60%. Gr2 DD. Triv AI. Ao root 38mm. Mild MR. Mod dil LA, mildly dil RA.   Osteoarthritis    Oxygen deficiency    Persistent atrial fibrillation (HCC)    a. Dx 02/2018. CHA2DS2VASc = 2-->Eliquis; b. 03/2018 DCCV (200J); c. 03/2018 recurrent Afib-->flecainide started 04/2018;  d. 05/06/2018 s/p successful DCCV - 200J x 2-->on amio.   Skin cancer    basal cell, R ear, MOHS    Past Surgical History:  Procedure Laterality Date   BROW  LIFT Bilateral 10/23/2016   Procedure: BLEPHAROPLASTY upper eyelid with excess skin;  Surgeon: Imagene Riches, MD;  Location: [facility];  Service: Ophthalmology;  Laterality: Bilateral;  MAC   CARDIOVERSION N/A 03/18/2018   Procedure: CARDIOVERSION;  Surgeon: Yvonne Kendall, MD;  Location: [facility];  Service: Cardiovascular;  Laterality: N/A;   CARDIOVERSION N/A 05/06/2018   Procedure: CARDIOVERSION;  Surgeon: Yvonne Kendall, MD;  Location: [facility];  Service: Cardiovascular;  Laterality: N/A;   CATARACT EXTRACTION  1986   OD   CATARACT EXTRACTION Bilateral 1995   x 2 for right and left    COLONOSCOPY     CORONARY  STENT INTERVENTION N/A 05/13/2018   Procedure: CORONARY STENT INTERVENTION;  Surgeon: Yvonne Kendall, MD;  Location: [facility];  Service: Cardiovascular;  Laterality: N/A;   ECTROPION REPAIR Bilateral 10/23/2016   Procedure: REPAIR OF ECTROPION sutures, extensive;  Surgeon: Imagene Riches, MD;  Location: [facility];  Service: Ophthalmology;  Laterality: Bilateral;   LIPOMA EXCISION  06/1997   left neck (Juengel)   MOHS SURGERY Right 2013   behind right ear   RIGHT/LEFT HEART CATH AND CORONARY ANGIOGRAPHY N/A 05/12/2018   Procedure: RIGHT/LEFT HEART CATH AND CORONARY ANGIOGRAPHY;  Surgeon: Iran Ouch, MD;  Location: [facility];  Service: Cardiovascular;  Laterality: N/A;   TONSILLECTOMY     TOTAL KNEE ARTHROPLASTY Left 01/17/2023   Procedure: LEFT TOTAL KNEE ARTHROPLASTY;  Surgeon: Cammy Copa, MD;  Location: [facility];  Service: Orthopedics;  Laterality: Left;   VASECTOMY  1982    Current Outpatient Medications  Medication Instructions   acetaminophen (TYLENOL) 1,000 mg, Oral, Every 6 hours PRN   amoxicillin (AMOXIL) 500 MG tablet 2g po 1 hr prior to dental procedure.   apixaban (ELIQUIS) 5 mg, Oral, 2 times daily   atorvastatin (LIPITOR) 80 MG tablet TAKE 1 TABLET BY MOUTH DAILY AT 6PM   carboxymethylcellulose (REFRESH PLUS) 0.5 % SOLN 1-2 drops, Both Eyes, 3 times daily PRN   diclofenac Sodium (VOLTAREN) 2 g, Topical, 4 times daily PRN   dofetilide (TIKOSYN) 500 mcg, Oral, 2 times daily   doxazosin (CARDURA) 4 MG tablet TAKE 1 TABLET BY MOUTH DAILY AFTER BREAKFAST   ezetimibe (ZETIA) 10 MG tablet TAKE 1 TABLET BY MOUTH DAILY   finasteride (PROSCAR) 5 MG tablet TAKE 1 TABLET BY MOUTH EVERY DAY   furosemide (LASIX) 20 MG tablet TAKE 1 TABLET BY MOUTH EVERY OTHER DAY, GENERIC EQUIVALENT FOR LASIX   levothyroxine (SYNTHROID) 150 mcg, Oral, Daily before breakfast, Except skip Sundays.  6 pill per week.   methocarbamol (ROBAXIN) 500 mg, Oral, Every 8  hours PRN   metoprolol succinate (TOPROL-XL) 25 MG 24 hr tablet TAKE 1 TABLET(25 MG) BY MOUTH DAILY   Multiple Vitamin (MULTIVITAMIN WITH MINERALS) TABS tablet 1 tablet, Oral, Daily, Senior Multivitamin    psyllium (METAMUCIL) 58.6 % packet 1 packet, Oral, Daily PRN   ramipril (ALTACE) 10 MG capsule TAKE ONE CAPSULE BY MOUTH DAILY. GENERIC EQUIVALENT FOR ALTACE    Social History:  The patient  reports that he quit smoking about 39 years ago. His smoking use included cigarettes. He started smoking about 59 years ago. He has a 40 pack-year smoking history. He has been exposed to tobacco smoke. He has never used smokeless tobacco. He reports current alcohol use. He reports that he does not use drugs.   Family History:  The patient's family history includes Heart disease in his paternal grandfather; Lung  cancer in his maternal grandfather; Stroke in his maternal grandfather and mother.  ROS:  Please see the history of present illness. All other systems are reviewed and otherwise negative.   PHYSICAL EXAM:  VS:  BP 112/72 (BP Location: Left Arm, Patient Position: Sitting, Cuff Size: Normal)   Pulse (!) 53   Ht 6' (1.829 m)   Wt 255 lb (115.7 kg)   SpO2 98%   BMI 34.58 kg/m  BMI: Body mass index is 34.58 kg/m.  GEN- The patient is well appearing, alert and oriented x 3 today.   Lungs- Clear to ausculation bilaterally, normal work of breathing.  Heart- Regular, bradycardic rate and rhythm, no murmurs, rubs or gallops Extremities- Trace peripheral edema, warm, dry   EKG is ordered. Personal review of EKG from today shows:    EKG Interpretation Date/Time:  Monday May 20 2023 09:16:15 EDT Ventricular Rate:  53 PR Interval:  188 QRS Duration:  96 QT Interval:  448 QTC Calculation: 420 R Axis:   -41  Text Interpretation: Sinus bradycardia Left axis deviation Confirmed by Sherie Don [phone]) on 05/20/2023 9:18:30 AM    03/20/2023 EKG - SB at 48; LAD; QTCb 425   Recent  Labs: 01/18/2023: Magnesium 2.2 01/29/2023: ALT 24 03/20/2023: B Natriuretic Peptide 186.5; BUN 21; Creatinine, Ser 1.27; Hemoglobin 13.5; Platelets 162; Potassium 4.4; Sodium 141 05/13/2023: TSH 0.70  05/25/2022: Cholesterol 121; HDL 41; LDL Cholesterol 66; Total CHOL/HDL Ratio 3.0; Triglycerides 70; VLDL 14   CrCl cannot be calculated (Patient's most recent lab result is older than the maximum 21 days allowed.).   Wt Readings from Last 3 Encounters:  05/20/23 255 lb (115.7 kg)  03/20/23 255 lb (115.7 kg)  01/29/23 260 lb (117.9 kg)     Additional studies reviewed include: Previous EP, cardiology notes.   TTE, 9/10/204  1. Left ventricular ejection fraction, by estimation, is 55 to 60%. The left ventricle has normal function. The left ventricle has no regional wall motion abnormalities. Left ventricular diastolic parameters are consistent with Grade I diastolic dysfunction (impaired relaxation).   2. Right ventricular systolic function is normal. The right ventricular size is normal. Tricuspid regurgitation signal is inadequate for assessing PA pressure.   3. Left atrial size was mildly dilated.   4. The mitral valve is normal in structure. Mild mitral valve regurgitation. No evidence of mitral stenosis.   5. The aortic valve has an indeterminant number of cusps. Aortic valve regurgitation is not visualized. No aortic stenosis is present.   6. There is moderate dilatation of the ascending aorta, measuring 47 mm.   7. The inferior vena cava is normal in size with greater than 50% respiratory variability, suggesting right atrial pressure of 3 mmHg.   NM myocardial spect, 09/09/2020 Pharmacological myocardial perfusion imaging study with no significant ischemia Small fixed apical defect consistent with prior MI, though attenuation artifact cannot be excluded. Normal wall motion, EF estimated at 57% No EKG changes concerning for ischemia at peak stress or in recovery. CT attenuation correction  images with mild aortic atherosclerosis, moderate three-vessel coronary calcification Low risk scan  TTE, 06/19/2019  1. Left ventricular ejection fraction, by visual estimation, is 55 to 60%. The left ventricle has normal function. There is no left ventricular hypertrophy.   2. Global right ventricle has normal systolic function.The right ventricular size is normal. No increase in right ventricular wall thickness.   3. Left atrial size was mild-moderately dilated.   4. Right atrial size was normal.  5. Mild to moderate mitral valve regurgitation.   6. There is mild dilatation of the aortic root measuring 38 mm. Aortic arch 3.8 cm   7. Mildly elevated pulmonary artery systolic pressure.   ASSESSMENT AND PLAN:  #) parox Afib EKG with stable intervals on tikosyn  Continues to have brief AFib episodes. Patient is happy with current level of control Recent BMP stable Continue tikosyn BID Continue toprol 25mg  daily  #) Hypercoag d/t afib CHA2DS2-VASc Score = 5 [CHF History: 1, HTN History: 1, Diabetes History: 0, Stroke History: 0, Vascular Disease History: 1, Age Score: 2, Gender Score: 0].  Therefore, the patient's annual risk of stroke is 7.2 %.    Stroke ppx - 5mg  eliquis BID, appropriately dosed No bleeding concerns  #) HFpEF #) HTN Most recent echo preserved EF, grade 1 DD Lower extremity edema improved since last appt Continue physical activity and limiting sodium intake     Current medicines are reviewed at length with the patient today.   The patient does not have concerns regarding his medicines.  The following changes were made today:  none  Labs/ tests ordered today include:  Orders Placed This Encounter  Procedures   EKG 12-Lead     Disposition: Follow up with Dr. Lalla Brothers or EP APP in 6 months   Signed, Sherie Don, NP  05/20/23  9:48 AM  Electrophysiology [facility]

## 2023-05-20 ENCOUNTER — Ambulatory Visit: Payer: Medicare Other | Attending: Cardiology | Admitting: Cardiology

## 2023-05-20 ENCOUNTER — Encounter: Payer: Self-pay | Admitting: Cardiology

## 2023-05-20 VITALS — BP 112/72 | HR 53 | Ht 72.0 in | Wt 255.0 lb

## 2023-05-20 DIAGNOSIS — I5032 Chronic diastolic (congestive) heart failure: Secondary | ICD-10-CM | POA: Diagnosis not present

## 2023-05-20 DIAGNOSIS — I480 Paroxysmal atrial fibrillation: Secondary | ICD-10-CM

## 2023-05-20 DIAGNOSIS — D6869 Other thrombophilia: Secondary | ICD-10-CM | POA: Diagnosis not present

## 2023-05-20 NOTE — Patient Instructions (Signed)
Medication Instructions:  - Your physician recommends that you continue on your current medications as directed. Please refer to the Current Medication list given to you today.  *If you need a refill on your cardiac medications before your next appointment, please call your pharmacy*   Lab Work: - none ordered  If you have labs (blood work) drawn today and your tests are completely normal, you will receive your results only by: MyChart Message (if you have MyChart) OR A paper copy in the mail If you have any lab test that is abnormal or we need to change your treatment, we will call you to review the results.   Testing/Procedures: - none ordered   Follow-Up: At [facility], you and your health needs are our priority.  As part of our continuing mission to provide you with exceptional heart care, we have created designated Provider Care Teams.  These Care Teams include your primary Cardiologist (physician) and Advanced Practice Providers (APPs -  Physician Assistants and Nurse Practitioners) who all work together to provide you with the care you need, when you need it.  We recommend signing up for the patient portal called "MyChart".  Sign up information is provided on this After Visit Summary.  MyChart is used to connect with patients for Virtual Visits (Telemedicine).  Patients are able to view lab/test results, encounter notes, upcoming appointments, etc.  Non-urgent messages can be sent to your provider as well.   To learn more about what you can do with MyChart, go to [email or web address].    Your next appointment:   1)  January 2025- Dr. Okey Dupre (per recall)  2) 6 months- Dr. Lalla Brothers (preferred) or Sherie Don, NP  Provider:   As above     Other Instructions N/a

## 2023-05-22 ENCOUNTER — Other Ambulatory Visit: Payer: Self-pay | Admitting: Internal Medicine

## 2023-06-05 DIAGNOSIS — L570 Actinic keratosis: Secondary | ICD-10-CM | POA: Diagnosis not present

## 2023-06-05 DIAGNOSIS — D2272 Melanocytic nevi of left lower limb, including hip: Secondary | ICD-10-CM | POA: Diagnosis not present

## 2023-06-05 DIAGNOSIS — D2262 Melanocytic nevi of left upper limb, including shoulder: Secondary | ICD-10-CM | POA: Diagnosis not present

## 2023-06-05 DIAGNOSIS — D2261 Melanocytic nevi of right upper limb, including shoulder: Secondary | ICD-10-CM | POA: Diagnosis not present

## 2023-06-05 DIAGNOSIS — D225 Melanocytic nevi of trunk: Secondary | ICD-10-CM | POA: Diagnosis not present

## 2023-07-16 ENCOUNTER — Other Ambulatory Visit: Payer: Self-pay | Admitting: Family Medicine

## 2023-08-04 ENCOUNTER — Other Ambulatory Visit: Payer: Self-pay | Admitting: Internal Medicine

## 2023-08-14 ENCOUNTER — Telehealth: Payer: Self-pay

## 2023-08-14 ENCOUNTER — Encounter: Payer: Self-pay | Admitting: Internal Medicine

## 2023-08-14 ENCOUNTER — Ambulatory Visit: Payer: Medicare Other | Admitting: Internal Medicine

## 2023-08-14 VITALS — BP 102/68 | HR 84 | Ht 72.0 in | Wt 258.0 lb

## 2023-08-14 DIAGNOSIS — K5900 Constipation, unspecified: Secondary | ICD-10-CM

## 2023-08-14 DIAGNOSIS — I480 Paroxysmal atrial fibrillation: Secondary | ICD-10-CM

## 2023-08-14 DIAGNOSIS — Z860101 Personal history of adenomatous and serrated colon polyps: Secondary | ICD-10-CM | POA: Diagnosis not present

## 2023-08-14 DIAGNOSIS — Z7901 Long term (current) use of anticoagulants: Secondary | ICD-10-CM

## 2023-08-14 MED ORDER — NA SULFATE-K SULFATE-MG SULF 17.5-3.13-1.6 GM/177ML PO SOLN
1.0000 | Freq: Once | ORAL | 0 refills | Status: AC
Start: 2023-08-14 — End: 2023-08-14

## 2023-08-14 NOTE — Telephone Encounter (Signed)
   Name: James [name]  DOB: 08-Oct-1945  MRN: [id number]  Primary Cardiologist: Lonni Hanson, MD  Chart reviewed as part of pre-operative protocol coverage. Because of James [name]'s past medical history and time since last visit, he will require a follow-up in-office visit in order to better assess preoperative cardiovascular risk.  Patient has an office visit scheduled on 08/22/2023 with Dr. Hanson. Appointment notes have been updated to reflect need for pre-op evaluation.   Pre-op covering staff:  - Please contact requesting surgeon's office via preferred method (i.e, phone, fax) to inform them of need for appointment prior to surgery.  This message will also be routed to pharmacy pool for input on holding Eliquis  as requested below so that this information is available to the clearing provider at time of patient's appointment.   Barnie Hila, NP  08/14/2023, 12:51 PM

## 2023-08-14 NOTE — Telephone Encounter (Signed)
 [facility] Pre-operative Risk Assessment     Request for surgical clearance:     Endoscopy Procedure  What type of surgery is being performed?     colonoscopy  When is this surgery scheduled?     09/24/2023  What type of clearance is required ?   Pharmacy  Are there any medications that need to be held prior to surgery and how long? Eliquis , 2 days  Practice name and name of physician performing surgery?      [facility]  What is your office phone and fax number?      Phone- [phone]  Fax- [phone]  Anesthesia type (None, local, MAC, general) ?       MAC   Please route your response to Janie Strothman, CMA, (AAMA)

## 2023-08-14 NOTE — Progress Notes (Signed)
 James [name] 78 y.o. [birth year] [id number]  Assessment & Plan:   Encounter Diagnoses  Name Primary?   Hx of adenomatous colonic polyps Yes   Paroxysmal atrial fibrillation (HCC)    Long term current use of anticoagulant    Constipation, unspecified constipation type    Miralax  every day for constipation  Explained doubtful that hemorrhoids causing constipation in 2023 he had incomplete defecation issues and / dyssynergic defecation on exam by me and was advised to work up to tbsp psyllium and possibly use MiraLax  and he has had chronic and intermittent issues w/ constipation  Surveillance colonoscopy will be scheduled  Hold Eliquis  x 2 days, confirm ok w/ cardiology - rare but real risk of stroke discussed w/ patient and he accepts Other typical colonoscopy risks also reviewed.   Rr:Ilwrjw, Arlyss RAMAN, MD   Subjective:   Chief Complaint: hx colon polyps, constipation  HPI The patient, a 78 year old with a history of polyps, atrial fibrillation, and hemorrhoids, presents to schedule a colonoscopy for  polyp surveillance. He has been experiencing difficulty initiating bowel movements, occurring approximately twice a week. The patient struggles to produce a bowel movement when the urge arises at times. He has been taking a daily teaspoon of Metamucil, but has not tried other laxatives. The patient associates these symptoms with his hemorrhoids, but acknowledges uncertainty about this connection. This change in bowel habits has been ongoing for the past one to two months. The patient is also on Eliquis  for stroke prevention in the context of his atrial fibrillation. Last colonoscopy 10/19/19   - Three 1 to 2 mm polyps in the transverse colon                            and in the ascending colon, removed with a cold                            biopsy forceps. Resected and retrieved. ADENOMAS                           - Diverticulosis in the entire examined colon.                            - Internal hemorrhoids.                           - The examination was otherwise normal on direct                            and retroflexion views. There were tattoos in                            ascending colon from marking of large polypectomy                            site 2017                           - Personal history of colonic polyps. 2017 25 mm tv  adenoma + other diminutive adenomas, 2018 3                            diminutive adenomas and no residual 25 mm polyp  EF 55-60% 04/16/23   3/23 visit with me for globus - negative ba swallow + tablet 3/24 and 3/23 (mild dysmotillity then) and occasional globus now but he has not had true dysphagia Lab Results  Component Value Date   WBC 8.1 03/20/2023   HGB 13.5 03/20/2023   HCT 41.6 03/20/2023   MCV 93.9 03/20/2023   PLT 162 03/20/2023   Lab Results  Component Value Date   TSH 0.70 05/13/2023     Chemistry      Component Value Date/Time   NA 141 03/20/2023 1637   NA 142 05/25/2021 0859   K 4.4 03/20/2023 1637   CL 108 03/20/2023 1637   CO2 26 03/20/2023 1637   BUN 21 03/20/2023 1637   BUN 18 05/25/2021 0859   CREATININE 1.27 (H) 03/20/2023 1637      Component Value Date/Time   CALCIUM  9.1 03/20/2023 1637   ALKPHOS 69 01/29/2023 1510   AST 29 01/29/2023 1510   ALT 24 01/29/2023 1510   BILITOT 1.3 (H) 01/29/2023 1510   BILITOT 1.0 05/25/2021 0859      Allergies  Allergen Reactions   Sulfonamide Derivatives Rash    childhood   Current Meds  Medication Sig   acetaminophen  (TYLENOL ) 500 MG tablet Take 1,000 mg by mouth every 6 (six) hours as needed for mild pain or moderate pain.   apixaban  (ELIQUIS ) 5 MG TABS tablet Take 1 tablet (5 mg total) by mouth 2 (two) times daily.   atorvastatin  (LIPITOR ) 80 MG tablet TAKE 1 TABLET BY MOUTH DAILY AT 6PM   diclofenac  Sodium (VOLTAREN ) 1 % GEL Apply 2 g topically 4 (four) times daily as needed. (Patient taking differently: Apply 2  g topically daily.)   dofetilide  (TIKOSYN ) 500 MCG capsule Take 1 capsule (500 mcg total) by mouth 2 (two) times daily.   doxazosin  (CARDURA ) 4 MG tablet TAKE 1 TABLET BY MOUTH DAILY AFTER BREAKFAST   ezetimibe  (ZETIA ) 10 MG tablet TAKE 1 TABLET BY MOUTH DAILY   finasteride  (PROSCAR ) 5 MG tablet TAKE 1 TABLET BY MOUTH EVERY DAY   furosemide  (LASIX ) 20 MG tablet TAKE 1 TABLET BY MOUTH EVERY OTHER DAY GENERIC EQUIVALENT FOR LASIX    levothyroxine  (SYNTHROID ) 150 MCG tablet Take 1 tablet (150 mcg total) by mouth daily before breakfast. Except skip Sundays.  6 pill per week.   metoprolol  succinate (TOPROL -XL) 25 MG 24 hr tablet TAKE 1 TABLET(25 MG) BY MOUTH DAILY   Multiple Vitamin (MULTIVITAMIN WITH MINERALS) TABS tablet Take 1 tablet by mouth daily. Senior Multivitamin   psyllium (METAMUCIL) 58.6 % packet Take 1 packet by mouth daily as needed (constipation).   ramipril  (ALTACE ) 10 MG capsule TAKE ONE CAPSULE BY MOUTH DAILY. GENERIC EQUIVALENT FOR ALTACE    Past Medical History:  Diagnosis Date   (HFpEF) heart failure with preserved ejection fraction (HCC)    a. 05/2018 Echo: EF 55-60%, gr2 DD.   Acute lower GI bleeding after colonoscopy and polypectomy, resolved 12/12/2015   Ascending aortic aneurysm (HCC)    a. 02/2018 Echo: mildly dil Ao root/asc ao/arch; b. 05/2018 CTA Chest: 4.7cm fusiform aneurysm of Asc Ao.   CAD (coronary artery disease)    a. 05/2018 Cath/PCI: LM nl,LAD 30p, 90/99d/apical, LCX 37m, OM1/2/3 min irregs,  RCA 85p (3.5x18 [place] DES).   Cardiac murmur    a. 02/2018 Echo: EF 60-65%, no rwma, mild AI, mildly dil Ao root/Asc Ao/Arch, Mild MR, mildly dil LA. Nl RV fxn.   Cataract    bilateral surgery to remove   CHF (congestive heart failure) (HCC)    Colon polyp    Constipation    Dysrhythmia    Hx A. Fib   Essential hypertension    Hemorrhoids    Hepatitis    self resolved, likely food exposure, 1974.    History of cardiovascular stress test    a. 02/2018 Myoview : EF  55-65%, small/mild apical defect w/ nl wall motion ->attenuation artifact. No ischemia. Low risk study.   Hx of adenomatous colonic polyps 12/05/2015   Hyperlipidemia    Hypothyroidism    Mitral regurgitation    a. 110/2019 Echo: EF 55-60%. Gr2 DD. Triv AI. Ao root 38mm. Mild MR. Mod dil LA, mildly dil RA.   Osteoarthritis    Oxygen deficiency    Persistent atrial fibrillation (HCC)    a. Dx 02/2018. CHA2DS2VASc = 2-->Eliquis ; b. 03/2018 DCCV (200J); c. 03/2018 recurrent Afib-->flecainide  started 04/2018;  d. 05/06/2018 s/p successful DCCV - 200J x 2-->on amio.   Skin cancer    basal cell, R ear, MOHS   Past Surgical History:  Procedure Laterality Date   BROW LIFT Bilateral 10/23/2016   Procedure: BLEPHAROPLASTY upper eyelid with excess skin;  Surgeon: Greig CHRISTELLA Gay, MD;  Location: [facility];  Service: Ophthalmology;  Laterality: Bilateral;  MAC   CARDIOVERSION N/A 03/18/2018   Procedure: CARDIOVERSION;  Surgeon: Mady Bruckner, MD;  Location: [facility];  Service: Cardiovascular;  Laterality: N/A;   CARDIOVERSION N/A 05/06/2018   Procedure: CARDIOVERSION;  Surgeon: Mady Bruckner, MD;  Location: [facility];  Service: Cardiovascular;  Laterality: N/A;   CATARACT EXTRACTION  1986   OD   CATARACT EXTRACTION Bilateral 1995   x 2 for right and left    COLONOSCOPY     CORONARY STENT INTERVENTION N/A 05/13/2018   Procedure: CORONARY STENT INTERVENTION;  Surgeon: Mady Bruckner, MD;  Location: [facility];  Service: Cardiovascular;  Laterality: N/A;   ECTROPION REPAIR Bilateral 10/23/2016   Procedure: REPAIR OF ECTROPION sutures, extensive;  Surgeon: Greig CHRISTELLA Gay, MD;  Location: [facility];  Service: Ophthalmology;  Laterality: Bilateral;   LIPOMA EXCISION  06/1997   left neck (Juengel)   MOHS SURGERY Right 2013   behind right ear   RIGHT/LEFT HEART CATH AND CORONARY ANGIOGRAPHY N/A 05/12/2018   Procedure: RIGHT/LEFT HEART CATH AND CORONARY ANGIOGRAPHY;  Surgeon: Darron Deatrice LABOR, MD;  Location: [facility];  Service: Cardiovascular;  Laterality: N/A;   TONSILLECTOMY     TOTAL KNEE ARTHROPLASTY Left 01/17/2023   Procedure: LEFT TOTAL KNEE ARTHROPLASTY;  Surgeon: Addie Cordella Hamilton, MD;  Location: [facility] OR;  Service: Orthopedics;  Laterality: Left;   VASECTOMY  1982   Social History   Social History Narrative   Married and lives with wife 1974   One daughter, not local   Retired from [employer], sea based telecommunication/contracting   family history includes Heart disease in his paternal grandfather; Lung cancer in his maternal grandfather; Stroke in his maternal grandfather and mother.   Review of Systems As per HPI  Objective:   Physical Exam @BP  102/68   Pulse 84   Ht 6' (1.829 m)   Wt 258 lb (117 kg)   SpO2 98%   BMI 34.99 kg/m @  General:  NAD Eyes:   anicteric Lungs:  clear Heart::  S1S2 no rubs, murmurs or gallops, reg rhythm Abdomen:  soft and nontender, BS+, obese  no HSM and NT Rectal  - inspected - sl bulge of RP and RA external hemorrhoids suspected Ext:   no edema, cyanosis or clubbing    Data Reviewed:  Cardiology notes 05/20/23

## 2023-08-14 NOTE — Telephone Encounter (Signed)
 Appt notes updated and will remove from pool

## 2023-08-14 NOTE — Telephone Encounter (Signed)
Please advise holding Eliquis prior to Colonoscopy.   Thank you!  DW

## 2023-08-14 NOTE — Patient Instructions (Signed)
 You have been scheduled for a colonoscopy. Please follow written instructions given to you at your visit today.   Please pick up your prep supplies at the pharmacy within the next 1-3 days.  If you use inhalers (even only as needed), please bring them with you on the day of your procedure.  DO NOT TAKE 7 DAYS PRIOR TO TEST- Trulicity (dulaglutide) Ozempic, Wegovy (semaglutide) Mounjaro (tirzepatide) Bydureon Bcise (exanatide extended release)  DO NOT TAKE 1 DAY PRIOR TO YOUR TEST Rybelsus (semaglutide) Adlyxin (lixisenatide) Victoza (liraglutide) Byetta (exanatide) ___________________________________________________________________________  James [name] will be contaced by our office prior to your procedure for directions on holding your Eliquis .  If you do not hear from our office 1 week prior to your scheduled procedure, please call [phone] to discuss.  Take a dose of Miralax  daily. This is over the counter. I appreciate the opportunity to care for you. [name], MD, [facility]

## 2023-08-15 NOTE — Telephone Encounter (Signed)
 Patient with diagnosis of a fib on Eliquis  for anticoagulation.    Procedure: colonoscopy Date of procedure: 09/24/23   CHA2DS2-VASc Score = 5  This indicates a 7.2% annual risk of stroke. The patient's score is based upon: CHF History: 1 HTN History: 1 Diabetes History: 0 Stroke History: 0 Vascular Disease History: 1 Age Score: 2 Gender Score: 0   CrCl 82 ml/min Platelet count 162K   Per office protocol, patient can hold Eliquis  for 2 days prior to procedure. .  **This guidance is not considered finalized until pre-operative APP has relayed final recommendations.**

## 2023-08-21 ENCOUNTER — Ambulatory Visit: Payer: Medicare Other | Admitting: *Deleted

## 2023-08-21 VITALS — Ht 72.0 in | Wt 255.0 lb

## 2023-08-21 DIAGNOSIS — Z0000 Encounter for general adult medical examination without abnormal findings: Secondary | ICD-10-CM

## 2023-08-21 NOTE — Patient Instructions (Signed)
 [name] , Thank you for taking time to come for your Medicare Wellness Visit. I appreciate your ongoing commitment to your health goals. Please review the following plan we discussed and let me know if I can assist you in the future.   Referrals/Orders/Follow-Ups/Clinician Recommendations: Remember to update your Tetanus (TDAP)  This is a list of the screening recommended for you and due dates:  Health Maintenance  Topic Date Due   DTaP/Tdap/Td vaccine (2 - Tdap) 02/18/2018   Colon Cancer Screening  10/19/2022   COVID-19 Vaccine (7 - 2024-25 season) 04/07/2023   Medicare Annual Wellness Visit  08/20/2024   Pneumonia Vaccine  Completed   Flu Shot  Completed   Hepatitis C Screening  Completed   Zoster (Shingles) Vaccine  Completed   HPV Vaccine  Aged Out    Advanced directives: (In Chart) A copy of your advanced directives are scanned into your chart should your provider ever need it.  Next Medicare Annual Wellness Visit scheduled for next year: Yes 08/24/24 @ 8:10

## 2023-08-21 NOTE — Progress Notes (Signed)
Subjective:   James [name] is a 78 y.o. male who presents for Medicare Annual/Subsequent preventive examination.  Visit Complete: Virtual I connected with  James [name] on 08/21/23 by a audio enabled telemedicine application and verified that I am speaking with the correct person using two identifiers. This patient declined Interactive audio and Acupuncturist. Therefore the visit was completed with audio only.   Patient Location: Home  Provider Location: Office/Clinic  I discussed the limitations of evaluation and management by telemedicine. The patient expressed understanding and agreed to proceed.  Vital Signs: Because this visit was a virtual/telehealth visit, some criteria may be missing or patient reported. Any vitals not documented were not able to be obtained and vitals that have been documented are patient reported.  Patient Medicare AWV questionnaire was completed by the patient on 08/18/23; I have confirmed that all information answered by patient is correct and no changes since this date.  Cardiac Risk Factors include: advanced age (>1men, >34 women);male gender;dyslipidemia;obesity (BMI >30kg/m2);hypertension;Other (see comment), Risk factor comments: AFib     Objective:    Today's Vitals   08/21/23 0853  Weight: 255 lb (115.7 kg)  Height: 6' (1.829 m)   Body mass index is 34.58 kg/m.     08/21/2023    9:04 AM 02/20/2023    1:06 PM 01/21/2023   10:00 AM 01/20/2023   11:22 AM 01/17/2023    2:00 PM 01/09/2023    8:22 AM 11/20/2022    8:16 AM  Advanced Directives  Does Patient Have a Medical Advance Directive? Yes Yes No No No Yes Yes  Type of Estate agent of [place];Living will  Healthcare Power of [employer] of [place];Living will  Healthcare Power of [place];Living will Healthcare Power of [place];Living will  Does patient want to make changes to medical advance directive? No - Patient declined Yes  (MAU/Ambulatory/Procedural Areas - Information given) No - Patient declined No - Patient declined No - Patient declined No - Patient declined   Copy of Healthcare Power of Attorney in Chart? Yes - validated most recent copy scanned in chart (See row information)  Yes - validated most recent copy scanned in chart (See row information)   Yes - validated most recent copy scanned in chart (See row information) Yes - validated most recent copy scanned in chart (See row information)  Would patient like information on creating a medical advance directive?  No - Patient declined No - Patient declined No - Patient declined No - Patient declined      Current Medications (verified) Outpatient Encounter Medications as of 08/21/2023  Medication Sig   acetaminophen (TYLENOL) 500 MG tablet Take 1,000 mg by mouth every 6 (six) hours as needed for mild pain or moderate pain.   amoxicillin (AMOXIL) 500 MG tablet 2g po 1 hr prior to dental procedure.   apixaban (ELIQUIS) 5 MG TABS tablet Take 1 tablet (5 mg total) by mouth 2 (two) times daily.   atorvastatin (LIPITOR) 80 MG tablet TAKE 1 TABLET BY MOUTH DAILY AT 6PM   carboxymethylcellulose (REFRESH PLUS) 0.5 % SOLN Place 1-2 drops into both eyes 3 (three) times daily as needed (dry/irritated eyes.).   diclofenac Sodium (VOLTAREN) 1 % GEL Apply 2 g topically 4 (four) times daily as needed. (Patient taking differently: Apply 2 g topically daily.)   dofetilide (TIKOSYN) 500 MCG capsule Take 1 capsule (500 mcg total) by mouth 2 (two) times daily.   doxazosin (CARDURA) 4 MG tablet TAKE 1  TABLET BY MOUTH DAILY AFTER BREAKFAST   ezetimibe (ZETIA) 10 MG tablet TAKE 1 TABLET BY MOUTH DAILY   finasteride (PROSCAR) 5 MG tablet TAKE 1 TABLET BY MOUTH EVERY DAY   furosemide (LASIX) 20 MG tablet TAKE 1 TABLET BY MOUTH EVERY OTHER DAY GENERIC EQUIVALENT FOR LASIX   levothyroxine (SYNTHROID) 150 MCG tablet Take 1 tablet (150 mcg total) by mouth daily before breakfast. Except skip  Sundays.  6 pill per week.   methocarbamol (ROBAXIN) 500 MG tablet Take 1 tablet (500 mg total) by mouth every 8 (eight) hours as needed for muscle spasms.   metoprolol succinate (TOPROL-XL) 25 MG 24 hr tablet TAKE 1 TABLET(25 MG) BY MOUTH DAILY   Multiple Vitamin (MULTIVITAMIN WITH MINERALS) TABS tablet Take 1 tablet by mouth daily. Senior Multivitamin   psyllium (METAMUCIL) 58.6 % packet Take 1 packet by mouth daily as needed (constipation).   ramipril (ALTACE) 10 MG capsule TAKE ONE CAPSULE BY MOUTH DAILY. GENERIC EQUIVALENT FOR ALTACE   No facility-administered encounter medications on file as of 08/21/2023.    Allergies (verified) Sulfonamide derivatives   History: Past Medical History:  Diagnosis Date   (HFpEF) heart failure with preserved ejection fraction (HCC)    a. 05/2018 Echo: EF 55-60%, gr2 DD.   Acute lower GI bleeding after colonoscopy and polypectomy, resolved 12/12/2015   Ascending aortic aneurysm (HCC)    a. 02/2018 Echo: mildly dil Ao root/asc ao/arch; b. 05/2018 CTA Chest: 4.7cm fusiform aneurysm of Asc Ao.   CAD (coronary artery disease)    a. 05/2018 Cath/PCI: LM nl,LAD 30p, 90/99d/apical, LCX 54m, OM1/2/3 min irregs, RCA 85p (3.5x18 [place] DES).   Cardiac murmur    a. 02/2018 Echo: EF 60-65%, no rwma, mild AI, mildly dil Ao root/Asc Ao/Arch, Mild MR, mildly dil LA. Nl RV fxn.   Cataract    bilateral surgery to remove   CHF (congestive heart failure) (HCC)    Colon polyp    Constipation    Dysrhythmia    Hx A. Fib   Essential hypertension    Hemorrhoids    Hepatitis    self resolved, likely food exposure, 1974.    History of cardiovascular stress test    a. 02/2018 Myoview: EF 55-65%, small/mild apical defect w/ nl wall motion ->attenuation artifact. No ischemia. Low risk study.   Hx of adenomatous colonic polyps 12/05/2015   Hyperlipidemia    Hypothyroidism    Mitral regurgitation    a. 110/2019 Echo: EF 55-60%. Gr2 DD. Triv AI. Ao root 38mm. Mild MR. Mod  dil LA, mildly dil RA.   Osteoarthritis    Oxygen deficiency    Persistent atrial fibrillation (HCC)    a. Dx 02/2018. CHA2DS2VASc = 2-->Eliquis; b. 03/2018 DCCV (200J); c. 03/2018 recurrent Afib-->flecainide started 04/2018;  d. 05/06/2018 s/p successful DCCV - 200J x 2-->on amio.   Skin cancer    basal cell, R ear, MOHS   Past Surgical History:  Procedure Laterality Date   BROW LIFT Bilateral 10/23/2016   Procedure: BLEPHAROPLASTY upper eyelid with excess skin;  Surgeon: Imagene Riches, MD;  Location: [facility];  Service: Ophthalmology;  Laterality: Bilateral;  MAC   CARDIOVERSION N/A 03/18/2018   Procedure: CARDIOVERSION;  Surgeon: Yvonne Kendall, MD;  Location: [facility] OR[facility];  Service: Cardiovascular;  Laterality: N/A;   CARDIOVERSION N/A 05/06/2018   Procedure: CARDIOVERSION;  Surgeon: Yvonne Kendall, MD;  Location: [facility];  Service: Cardiovascular;  Laterality: N/A;   CATARACT EXTRACTION  1986   OD  CATARACT EXTRACTION Bilateral 1995   x 2 for right and left    COLONOSCOPY     CORONARY STENT INTERVENTION N/A 05/13/2018   Procedure: CORONARY STENT INTERVENTION;  Surgeon: Yvonne Kendall, MD;  Location: [facility];  Service: Cardiovascular;  Laterality: N/A;   ECTROPION REPAIR Bilateral 10/23/2016   Procedure: REPAIR OF ECTROPION sutures, extensive;  Surgeon: Imagene Riches, MD;  Location: [facility];  Service: Ophthalmology;  Laterality: Bilateral;   LIPOMA EXCISION  06/1997   left neck (Juengel)   MOHS SURGERY Right 2013   behind right ear   RIGHT/LEFT HEART CATH AND CORONARY ANGIOGRAPHY N/A 05/12/2018   Procedure: RIGHT/LEFT HEART CATH AND CORONARY ANGIOGRAPHY;  Surgeon: Iran Ouch, MD;  Location: [facility];  Service: Cardiovascular;  Laterality: N/A;   TONSILLECTOMY     TOTAL KNEE ARTHROPLASTY Left 01/17/2023   Procedure: LEFT TOTAL KNEE ARTHROPLASTY;  Surgeon: Cammy Copa, MD;  Location: [facility];  Service: Orthopedics;  Laterality:  Left;   VASECTOMY  1982   Family History  Problem Relation Age of Onset   Stroke Mother    Lung cancer Maternal Grandfather        smoker   Stroke Maternal Grandfather    Heart disease Paternal Grandfather        MI, old age   Colon cancer Neg Hx    Colon polyps Neg Hx    Esophageal cancer Neg Hx    Rectal cancer Neg Hx    Stomach cancer Neg Hx    Bladder Cancer Neg Hx    Prostate cancer Neg Hx    Social History   Socioeconomic History   Marital status: Married    Spouse name: [name]   Number of children: 1   Years of education: 14   Highest education level: Associate degree: occupational, Scientist, product/process development, or vocational program  Occupational History   Occupation: Retired    Associate Professor: RETIRED    Comment: 1  Tobacco Use   Smoking status: Former    Current packs/day: 0.00    Average packs/day: 2.0 packs/day for 20.0 years (40.0 ttl pk-yrs)    Types: Cigarettes    Start date: 08/07/1963    Quit date: 08/07/1983    Years since quitting: 40.0    Passive exposure: Past   Smokeless tobacco: Never   Tobacco comments:    Former smoker 11/17/21 quit over 40 years ago  Vaping Use   Vaping status: Never Used  Substance and Sexual Activity   Alcohol use: Yes    Comment: 1-2 per day   Drug use: No   Sexual activity: Yes    Partners: Female  Other Topics Concern   Not on file  Social History Narrative   Married and lives with wife 1974   One daughter, not local   Retired from [employer], sea based Administrator, sports   Social Drivers of Health   Financial Resource Strain: Low Risk  (08/21/2023)   Overall Financial Resource Strain (CARDIA)    Difficulty of Paying Living Expenses: Not hard at all  Food Insecurity: No Food Insecurity (08/21/2023)   Hunger Vital Sign    Worried About Running Out of Food in the Last Year: Never true    Ran Out of Food in the Last Year: Never true  Transportation Needs: No Transportation Needs (08/21/2023)   PRAPARE - Scientist, research (physical sciences) (Medical): No    Lack of Transportation (Non-Medical): No  Physical Activity: Insufficiently Active (  08/21/2023)   Exercise Vital Sign    Days of Exercise per Week: 5 days    Minutes of Exercise per Session: 20 min  Stress: No Stress Concern Present (08/21/2023)   [employer] of Occupational Health - Occupational Stress Questionnaire    Feeling of Stress : Not at all  Social Connections: Socially Isolated (08/21/2023)   Social Connection and Isolation Panel [NHANES]    Frequency of Communication with Friends and Family: Once a week    Frequency of Social Gatherings with Friends and Family: Once a week    Attends Religious Services: Never    Database administrator or Organizations: Yes    Attends Banker Meetings: 1 to 4 times per year    Marital Status: Widowed    Tobacco Counseling Counseling given: Not Answered Tobacco comments: Former smoker 11/17/21 quit over 40 years ago   Clinical Intake:  Pre-visit preparation completed: Yes  Pain : No/denies pain     BMI - recorded: 34.58 Nutritional Status: BMI > 30  Obese Nutritional Risks: None Diabetes: No  How often do you need to have someone help you when you read instructions, pamphlets, or other written materials from your doctor or pharmacy?: 1 - Never  Interpreter Needed?: No  Information entered by :: R. Ralph Benavidez LPN   Activities of Daily Living    08/21/2023    8:55 AM 08/18/2023    1:54 PM  In your present state of health, do you have any difficulty performing the following activities:  Hearing? 0 0  Vision? 0 0  Difficulty concentrating or making decisions? 0 0  Walking or climbing stairs? 0 0  Dressing or bathing? 0 0  Doing errands, shopping? 0 0  Preparing Food and eating ? N N  Using the Toilet? N N  In the past six months, have you accidently leaked urine? N N  Do you have problems with loss of bowel control? N N  Managing your Medications? N N  Managing your Finances? N  N  Housekeeping or managing your Housekeeping? N N    Patient Care Team: Joaquim Nam, MD as PCP - General (Family Medicine) End, Cristal Deer, MD as PCP - Cardiology (Cardiology) Lanier Prude, MD as PCP - Electrophysiology (Cardiology) Park Pope, Paramedic as Paramedic (Emergency Medicine)  Indicate any recent Medical Services you may have received from other than [facility] providers in the past year (date may be approximate).     Assessment:   This is a routine wellness examination for [facility].  Hearing/Vision screen Hearing Screening - Comments:: No issues Vision Screening - Comments:: glasses   Goals Addressed             This Visit's Progress    Patient Stated       Wants to start back hiking and fishing again       Depression Screen    08/21/2023    9:00 AM 01/29/2023    2:30 PM 08/20/2022    7:44 AM 08/17/2021    8:22 AM 08/16/2020    8:17 AM 08/11/2019    3:39 PM 08/08/2018    7:59 AM  PHQ 2/9 Scores  PHQ - 2 Score 0 0 0 0 0 0 0  PHQ- 9 Score 0 4   0 0 0    Fall Risk    08/21/2023    8:57 AM 08/18/2023    1:54 PM 01/29/2023    2:29 PM 08/20/2022    7:45 AM 08/18/2022  8:37 AM  Fall Risk   Falls in the past year? 0 0 0 0 0  Number falls in past yr: 0 0 0 0 0  Injury with Fall? 0 0 0 0 0  Risk for fall due to : No Fall Risks  No Fall Risks No Fall Risks   Follow up Falls prevention discussed;Falls evaluation completed  Falls evaluation completed Falls evaluation completed     MEDICARE RISK AT HOME: Medicare Risk at Home Any stairs in or around the home?: (Patient-Rptd) No If so, are there any without handrails?: (Patient-Rptd) No Home free of loose throw rugs in walkways, pet beds, electrical cords, etc?: (Patient-Rptd) Yes Adequate lighting in your home to reduce risk of falls?: (Patient-Rptd) Yes Life alert?: (Patient-Rptd) No Use of a cane, walker or w/c?: (Patient-Rptd) No Grab bars in the bathroom?: (Patient-Rptd) No Shower chair or bench  in shower?: (Patient-Rptd) No Elevated toilet seat or a handicapped toilet?: (Patient-Rptd) No  Cognitive Function:    08/16/2020    8:19 AM 08/11/2019    3:41 PM 08/08/2018    7:58 AM 07/25/2017   10:18 AM  MMSE - Mini Mental State Exam  Orientation to time 5 5 5 5   Orientation to Place 5 5 5 5   Registration 3 3 3 3   Attention/ Calculation 5 5 0 0  Recall 3 3 3 3   Language- name 2 objects   0 0  Language- repeat 1 1 1 1   Language- follow 3 step command   3 3  Language- read & follow direction   0 0  Write a sentence   0 0  Copy design   0 0  Total score   20 20        08/21/2023    9:04 AM 08/20/2022    7:46 AM  6CIT Screen  What Year? 0 points 0 points  What month? 0 points 0 points  What time? 0 points 0 points  Count back from 20 0 points 0 points  Months in reverse 0 points 0 points  Repeat phrase  0 points  Total Score  0 points    Immunizations Immunization History  Administered Date(s) Administered   Fluad Quad(high Dose 65+) 05/07/2019, 04/20/2022   Influenza, High Dose Seasonal PF 04/14/2017   Influenza,inj,Quad PF,6+ Mos 04/16/2018   Influenza-Unspecified 05/06/2013, 04/20/2014, 05/07/2015, 04/06/2016, 04/14/2017, 04/16/2018, 05/06/2020, 04/24/2021   Moderna Covid-19 Fall Seasonal Vaccine 28yrs & older 05/07/2022   PFIZER(Purple Top)SARS-COV-2 Vaccination 08/29/2019, 09/19/2019, 05/05/2020, 11/07/2020   Pfizer Covid-19 Vaccine Bivalent Booster 37yrs & up 04/24/2021   Pneumococcal Conjugate-13 07/20/2014   Pneumococcal Polysaccharide-23 07/16/2013   Respiratory Syncytial Virus Vaccine,Recomb Aduvanted(Arexvy) 04/06/2022   Td 02/19/2008   Zoster Recombinant(Shingrix) 06/11/2019, 08/17/2019   Zoster, Live 09/30/2007    TDAP status: Due, Education has been provided regarding the importance of this vaccine. Advised may receive this vaccine at local pharmacy or Health Dept. Aware to provide a copy of the vaccination record if obtained from local pharmacy or  Health Dept. Verbalized acceptance and understanding.  Flu Vaccine status: Up to date  Pneumococcal vaccine status: Up to date  Covid-19 vaccine status: Completed vaccines  Qualifies for Shingles Vaccine? Yes   Zostavax completed Yes   Shingrix Completed?: Yes  Screening Tests Health Maintenance  Topic Date Due   DTaP/Tdap/Td (2 - Tdap) 02/18/2018   Colonoscopy  10/19/2022   INFLUENZA VACCINE  03/07/2023   COVID-19 Vaccine (7 - 2024-25 season) 04/07/2023   Medicare Annual Wellness (AWV)  08/21/2023   Pneumonia Vaccine 48+ Years old  Completed   Hepatitis C Screening  Completed   Zoster Vaccines- Shingrix  Completed   HPV VACCINES  Aged Out    Health Maintenance  Health Maintenance Due  Topic Date Due   DTaP/Tdap/Td (2 - Tdap) 02/18/2018   Colonoscopy  10/19/2022   INFLUENZA VACCINE  03/07/2023   COVID-19 Vaccine (7 - 2024-25 season) 04/07/2023   Medicare Annual Wellness (AWV)  08/21/2023    Colorectal cancer screening: Type of screening: Colonoscopy. Completed 10/2019. Repeat every 3 years Patient is scheduled for procedure  Lung Cancer Screening: (Low Dose CT Chest recommended if Age 43-80 years, 20 pack-year currently smoking OR have quit w/in 15years.) does not qualify.     Additional Screening:  Hepatitis C Screening: does qualify; Completed 10/2015  Vision Screening: Recommended annual ophthalmology exams for early detection of glaucoma and other disorders of the eye. Is the patient up to date with their annual eye exam?  Yes  Who is the provider or what is the name of the office in which the patient attends annual eye exams? [facility] If pt is not established with a provider, would they like to be referred to a provider to establish care? No .     Community Resource Referral / Chronic Care Management: CRR required this visit?  No   CCM required this visit?  No     Plan:     I have personally reviewed and noted the following in the patient's  chart:   Medical and social history Use of alcohol, tobacco or illicit drugs  Current medications and supplements including opioid prescriptions. Patient is not currently taking opioid prescriptions. Functional ability and status Nutritional status Physical activity Advanced directives List of other physicians Hospitalizations, surgeries, and ER visits in previous 12 months Vitals Screenings to include cognitive, depression, and falls Referrals and appointments  In addition, I have reviewed and discussed with patient certain preventive protocols, quality metrics, and best practice recommendations. A written personalized care plan for preventive services as well as general preventive health recommendations were provided to patient.     Sydell Axon, LPN   [id number]/44/[id number]   After Visit Summary: (MyChart) Due to this being a telephonic visit, the after visit summary with patients personalized plan was offered to patient via MyChart   Nurse Notes: None

## 2023-08-22 ENCOUNTER — Ambulatory Visit: Payer: Medicare Other | Attending: Internal Medicine | Admitting: Internal Medicine

## 2023-08-22 ENCOUNTER — Encounter: Payer: Self-pay | Admitting: Internal Medicine

## 2023-08-22 ENCOUNTER — Ambulatory Visit: Payer: Medicare Other | Admitting: Internal Medicine

## 2023-08-22 VITALS — BP 122/72 | HR 80 | Ht 72.0 in | Wt 257.6 lb

## 2023-08-22 DIAGNOSIS — I712 Thoracic aortic aneurysm, without rupture, unspecified: Secondary | ICD-10-CM | POA: Diagnosis not present

## 2023-08-22 DIAGNOSIS — I2510 Atherosclerotic heart disease of native coronary artery without angina pectoris: Secondary | ICD-10-CM

## 2023-08-22 DIAGNOSIS — Z79899 Other long term (current) drug therapy: Secondary | ICD-10-CM

## 2023-08-22 DIAGNOSIS — I480 Paroxysmal atrial fibrillation: Secondary | ICD-10-CM | POA: Diagnosis not present

## 2023-08-22 DIAGNOSIS — Z01810 Encounter for preprocedural cardiovascular examination: Secondary | ICD-10-CM

## 2023-08-22 DIAGNOSIS — I10 Essential (primary) hypertension: Secondary | ICD-10-CM

## 2023-08-22 DIAGNOSIS — I5032 Chronic diastolic (congestive) heart failure: Secondary | ICD-10-CM

## 2023-08-22 NOTE — Patient Instructions (Signed)
Medication Instructions:  Your physician recommends that you continue on your current medications as directed. Please refer to the Current Medication list given to you today.   *If you need a refill on your cardiac medications before your next appointment, please call your pharmacy*   Lab Work: Your provider would like for you to have following labs drawn today (CBC, CMP, Mg, Lipid).     Testing/Procedures: CT Angiography (CTA) chest/aorta in June, 2025, is a special type of CT scan that uses a computer to produce multi-dimensional views of major blood vessels throughout the body. In CT angiography, a contrast material is injected through an IV to help visualize the blood vessels  Nothing to eat or drink 4 hours prior to test  [facility] [address]  [place], [place] [address]    Follow-Up: At [facility], you and your health needs are our priority.  As part of our continuing mission to provide you with exceptional heart care, we have created designated Provider Care Teams.  These Care Teams include your primary Cardiologist (physician) and Advanced Practice Providers (APPs -  Physician Assistants and Nurse Practitioners) who all work together to provide you with the care you need, when you need it.  We recommend signing up for the patient portal called "MyChart".  Sign up information is provided on this After Visit Summary.  MyChart is used to connect with patients for Virtual Visits (Telemedicine).  Patients are able to view lab/test results, encounter notes, upcoming appointments, etc.  Non-urgent messages can be sent to your provider as well.   To learn more about what you can do with MyChart, go to [email or web address].    Your next appointment:   1 year(s)  Provider:   You may see Yvonne Kendall, MD or one of the following Advanced Practice Providers on your designated Care Team:   Nicolasa Ducking, NP Eula Listen,  PA-C Cadence Fransico Michael, PA-C Charlsie Quest, NP Carlos Levering, NP

## 2023-08-22 NOTE — Progress Notes (Signed)
Cardiology Office Note:  .   Date:  08/22/2023  ID:  James [name], DOB May 02, 1946, MRN [id number] PCP: Joaquim Nam, MD  [facility] Providers Cardiologist:  Yvonne Kendall, MD Electrophysiologist:  Lanier Prude, MD     History of Present Illness: .   Discussed the use of AI scribe software for clinical note transcription with the patient, who gave verbal consent to proceed.  James [name] is a 78 y.o. male with history of coronary artery disease status post PCI to the RCA (2019), persistent atrial fibrillation, thoracic aortic aneurysm, HFpEF, hypertension, hyperlipidemia, hypothyroidism, and obesity, who presents for follow-up of CAD, thoracic aortic aneurysm, and atrial fibrillation as well as preoperative cardiovascular risk assessment in anticipation of colonoscopy.  I last saw him a year ago, at which time he was feeling well.  He has subsequently followed with EP for management of his persistent atrial fibrillation with dofetilide therapy.  He was last seen in 05/2023, at which time he was feeling fairly well and continuing to recover from knee surgery.  He noted a single brief episode of symptomatic atrial fibrillation with associated mild dyspnea about a week before his visit.  No medication changes were made, continuing dofetilide, metoprolol, and apixaban.  Today, James [name] reports a single brief episode of an 'odd sensation' in the chest approximately ten days ago, which he suspected might have been atrial fibrillation. However, the episode was fleeting and he was unable to confirm it with his [facility] device (episode resolved too quickly). He describes the sensation as an increased awareness of his heartbeat, but denies any associated chest pain, shortness of breath, leg swelling, or episodes of lightheadedness, dizziness, falls, or syncope. James [name] continues to take Tikosyn and Eliquis without any missed doses and denies any bleeding complications. He has been  maintaining an active lifestyle by walking a couple of times a day for distances of two to three hundred yards without experiencing any shortness of breath. However, he reports that his walks are limited by hip discomfort. James [name] is scheduled for a routine colonoscopy next month with Dr. Leone Payor. He has not had any issues with previous colonoscopies or anesthesia.     ROS: See HPI  Studies Reviewed: Marland Kitchen   EKG Interpretation Date/Time:  Thursday August 22 2023 08:20:20 EST Ventricular Rate:  80 PR Interval:  200 QRS Duration:  94 QT Interval:  374 QTC Calculation: 432 R Axis:   -48  Text Interpretation: Normal sinus rhythm Left anterior fascicular block Possible Inferior infarct , age undetermined When compared with ECG of 20-May-2023 09:16, Vent. rate has increased BY  27 BPM Otherwise no significant change Confirmed by Samariah Hokenson [phone]) on 08/22/2023 8:34:29 AM Also confirmed by Marciano Mundt [phone])  on 08/22/2023 8:37:45 AM    TTE (04/16/2023): Normal LV size and wall thickness.  LVEF 55-60% with normal wall motion and grade 1 diastolic dysfunction.  Normal RV size and function.  Mild left atrial enlargement.  No pericardial effusion.  Structurally normal mitral valve with mild regurgitation.  Normal tricuspid and aortic valves.  Moderately dilated ascending aorta, measuring 4.7 cm.  Risk Assessment/Calculations:    CHA2DS2-VASc Score = 5   This indicates a 7.2% annual risk of stroke. The patient's score is based upon: CHF History: 1 HTN History: 1 Diabetes History: 0 Stroke History: 0 Vascular Disease History: 1 Age Score: 2 Gender Score: 0            Physical Exam:  VS:  BP 122/72   Pulse 80   Ht 6' (1.829 m)   Wt 257 lb 9.6 oz (116.8 kg)   SpO2 99%   BMI 34.94 kg/m    Wt Readings from Last 3 Encounters:  08/22/23 257 lb 9.6 oz (116.8 kg)  08/21/23 255 lb (115.7 kg)  08/14/23 258 lb (117 kg)    General:  NAD. Neck: No JVD or HJR. Lungs: Clear to  auscultation bilaterally without wheezes or crackles. Heart: Regular rate and rhythm without murmurs, rubs, or gallops. Abdomen: Soft, nontender, nondistended. Extremities: Trace pretibial edema bilaterally.  ASSESSMENT AND PLAN: .    Paroxysmal atrial fibrillation: James [name] reports a single episode during which he had an "odd sensation" in his chest that he speculates was a brief run of atrial fibrillation.  It was too short for him to confirm with his [facility] devic we will plan to continue his current regimen of apixaban, dofetilide, and metoprolol.  QTc remains appropriate on today's EKG.  I will check a CMP and magnesium level as well as CBC today.  Thoracic aortic aneurysm: Moderate dilation of the ascending aorta has been stable on prior scans, most recently in 01/2023.  Will plan to repeat a CTA of the chest in June or July to ensure stability.  Continue medical therapy to prevent progression of disease, including lipid and blood pressure control.  Fluoroquinolone antibiotics should be avoided, if possible.  Coronary artery disease: No angina reported status post PCI to the RCA in 05/2018.  Continue apixaban and lieu of aspirin as well as atorvastatin.  Check CBC, CMP, and lipid panel today.  Chronic HFpEF: Volume status appears stable with trace pretibial edema.  Continue every other day furosemide.  Hypertension: BP at goal.  Continue metoprolol, ramipril, and doxazosin.  Preoperative cardiovascular risk assessment: James [name] is scheduled for colonoscopy next month.  He does not have any symptoms to suggest unstable cardiovascular disease and is able to perform greater than 4 METS without symptoms.  He can proceed with this low risk procedure without additional testing.  I recommend holding apixaban 2 days prior to colonoscopy and resuming apixaban when felt safe to do so by Dr. Leone Payor.  Given history of PCI in 2019, I recommend that James [name] take aspirin 81 mg daily while  apixaban is on hold.    Dispo: Return to clinic in 1 year.  Signed, Yvonne Kendall, MD

## 2023-08-23 ENCOUNTER — Encounter: Payer: Self-pay | Admitting: Internal Medicine

## 2023-08-23 LAB — COMPREHENSIVE METABOLIC PANEL
ALT: 8 [IU]/L (ref 0–44)
AST: 15 [IU]/L (ref 0–40)
Albumin: 3.7 g/dL — ABNORMAL LOW (ref 3.8–4.8)
Alkaline Phosphatase: 78 [IU]/L (ref 44–121)
BUN/Creatinine Ratio: 13 (ref 10–24)
BUN: 16 mg/dL (ref 8–27)
Bilirubin Total: 0.7 mg/dL (ref 0.0–1.2)
CO2: 25 mmol/L (ref 20–29)
Calcium: 9.6 mg/dL (ref 8.6–10.2)
Chloride: 107 mmol/L — ABNORMAL HIGH (ref 96–106)
Creatinine, Ser: 1.28 mg/dL — ABNORMAL HIGH (ref 0.76–1.27)
Globulin, Total: 2 g/dL (ref 1.5–4.5)
Glucose: 103 mg/dL — ABNORMAL HIGH (ref 70–99)
Potassium: 4.7 mmol/L (ref 3.5–5.2)
Sodium: 145 mmol/L — ABNORMAL HIGH (ref 134–144)
Total Protein: 5.7 g/dL — ABNORMAL LOW (ref 6.0–8.5)
eGFR: 58 mL/min/{1.73_m2} — ABNORMAL LOW (ref >59)

## 2023-08-23 LAB — CBC
Hematocrit: 45.3 % (ref 37.5–51.0)
Hemoglobin: 15 g/dL (ref 13.0–17.7)
MCH: 30.7 pg (ref 26.6–33.0)
MCHC: 33.1 g/dL (ref 31.5–35.7)
MCV: 93 fL (ref 79–97)
Platelets: 158 10*3/uL (ref 150–450)
RBC: 4.88 x10E6/uL (ref 4.14–5.80)
RDW: 13 % (ref 11.6–15.4)
WBC: 8 10*3/uL (ref 3.4–10.8)

## 2023-08-23 LAB — LIPID PANEL
Chol/HDL Ratio: 2.8 {ratio} (ref 0.0–5.0)
Cholesterol, Total: 116 mg/dL (ref 100–199)
HDL: 41 mg/dL (ref >39)
LDL Chol Calc (NIH): 60 mg/dL (ref 0–99)
Triglycerides: 71 mg/dL (ref 0–149)
VLDL Cholesterol Cal: 15 mg/dL (ref 5–40)

## 2023-08-23 LAB — MAGNESIUM: Magnesium: 2 mg/dL (ref 1.6–2.3)

## 2023-08-27 NOTE — Telephone Encounter (Signed)
Per Dr Serita Kyle note James [name] informed to hold his apixaban 2 days prior to his procedure and take an 81mg  Aspirin while holding the apixaban. James [name] verbalized understanding.

## 2023-09-13 ENCOUNTER — Other Ambulatory Visit: Payer: Self-pay | Admitting: Family Medicine

## 2023-09-13 DIAGNOSIS — I4891 Unspecified atrial fibrillation: Secondary | ICD-10-CM

## 2023-09-13 NOTE — Telephone Encounter (Signed)
 Last office visit: 01/29/23 Next office visit:nothing scheduled Last refill: ELIQUIS  5MG  TABLETS  08/31/22 180 tablets 3 refills

## 2023-09-15 NOTE — Telephone Encounter (Signed)
 Sent. Thanks.  Please schedule yearly visit for summer 2025.  Thanks.

## 2023-09-16 ENCOUNTER — Other Ambulatory Visit: Payer: Self-pay

## 2023-09-16 DIAGNOSIS — I4891 Unspecified atrial fibrillation: Secondary | ICD-10-CM

## 2023-09-16 MED ORDER — APIXABAN 5 MG PO TABS: 5.0000 mg | ORAL_TABLET | Freq: Two times a day (BID) | ORAL | 1 refills | Status: DC

## 2023-09-16 NOTE — Telephone Encounter (Signed)
 Please schedule patient for one year office visit

## 2023-09-17 ENCOUNTER — Encounter: Payer: Self-pay | Admitting: Internal Medicine

## 2023-09-17 NOTE — Telephone Encounter (Signed)
Spoke to pt, scheduled cpe for 09/30/23

## 2023-09-23 ENCOUNTER — Other Ambulatory Visit: Payer: Self-pay | Admitting: Family Medicine

## 2023-09-23 ENCOUNTER — Other Ambulatory Visit: Payer: Self-pay | Admitting: Internal Medicine

## 2023-09-23 ENCOUNTER — Encounter: Payer: Self-pay | Admitting: Certified Registered Nurse Anesthetist

## 2023-09-23 NOTE — Telephone Encounter (Signed)
Last refill: finasteride (PROSCAR) 5 MG tablet 09/26/22 90 tablets 3 refills Last office visit: 01/29/23 Next office visit: 09/30/23

## 2023-09-24 ENCOUNTER — Ambulatory Visit (AMBULATORY_SURGERY_CENTER): Payer: Medicare Other | Admitting: Internal Medicine

## 2023-09-24 ENCOUNTER — Encounter: Payer: Self-pay | Admitting: Internal Medicine

## 2023-09-24 VITALS — BP 116/76 | HR 77 | Temp 97.3°F | Resp 9 | Ht 72.0 in | Wt 258.0 lb

## 2023-09-24 DIAGNOSIS — Z9889 Other specified postprocedural states: Secondary | ICD-10-CM | POA: Diagnosis not present

## 2023-09-24 DIAGNOSIS — K5730 Diverticulosis of large intestine without perforation or abscess without bleeding: Secondary | ICD-10-CM | POA: Diagnosis not present

## 2023-09-24 DIAGNOSIS — D123 Benign neoplasm of transverse colon: Secondary | ICD-10-CM

## 2023-09-24 DIAGNOSIS — K648 Other hemorrhoids: Secondary | ICD-10-CM

## 2023-09-24 DIAGNOSIS — R194 Change in bowel habit: Secondary | ICD-10-CM

## 2023-09-24 DIAGNOSIS — D122 Benign neoplasm of ascending colon: Secondary | ICD-10-CM | POA: Diagnosis not present

## 2023-09-24 DIAGNOSIS — Z860101 Personal history of adenomatous and serrated colon polyps: Secondary | ICD-10-CM | POA: Diagnosis not present

## 2023-09-24 MED ORDER — SODIUM CHLORIDE 0.9 % IV SOLN: 500.0000 mL | Freq: Once | INTRAVENOUS | Status: DC

## 2023-09-24 NOTE — Patient Instructions (Addendum)
I found and removed 2 small polyps. Saw diverticulosis + hemorroids. No clear cause of bowel habit changes - ? From diverticulosis.  Nothing bad!  Try taking more metamucil 1-2 tablespoons daily and more dietary fiber.  I will let you know pathology results by mail and/or My Chart.  Restart Eliquis tomorrow.  I appreciate the opportunity to care for you. Iva Boop, MD, [facility]  Handout provided on polyps, diverticulosis, and hemorrhoids.    YOU HAD AN ENDOSCOPIC PROCEDURE TODAY AT THE  ENDOSCOPY CENTER:   Refer to the procedure report that was given to you for any specific questions about what was found during the examination.  If the procedure report does not answer your questions, please call your gastroenterologist to clarify.  If you requested that your care partner not be given the details of your procedure findings, then the procedure report has been included in a sealed envelope for you to review at your convenience later.  YOU SHOULD EXPECT: Some feelings of bloating in the abdomen. Passage of more gas than usual.  Walking can help get rid of the air that was put into your GI tract during the procedure and reduce the bloating. If you had a lower endoscopy (such as a colonoscopy or flexible sigmoidoscopy) you may notice spotting of blood in your stool or on the toilet paper. If you underwent a bowel prep for your procedure, you may not have a normal bowel movement for a few days.  Please Note:  You might notice some irritation and congestion in your nose or some drainage.  This is from the oxygen used during your procedure.  There is no need for concern and it should clear up in a day or so.  SYMPTOMS TO REPORT IMMEDIATELY:  Following lower endoscopy (colonoscopy or flexible sigmoidoscopy):  Excessive amounts of blood in the stool  Significant tenderness or worsening of abdominal pains  Swelling of the abdomen that is new, acute  Fever of 100F or higher  For urgent or  emergent issues, a gastroenterologist can be reached at any hour by calling [phone]. Do not use MyChart messaging for urgent concerns.    DIET:  We do recommend a small meal at first, but then you may proceed to your regular diet.  Drink plenty of fluids but you should avoid alcoholic beverages for 24 hours.  ACTIVITY:  You should plan to take it easy for the rest of today and you should NOT DRIVE or use heavy machinery until tomorrow (because of the sedation medicines used during the test).    FOLLOW UP: Our staff will call the number listed on your records the next business day following your procedure.  We will call around 7:15- 8:00 am to check on you and address any questions or concerns that you may have regarding the information given to you following your procedure. If we do not reach you, we will leave a message.     If any biopsies were taken you will be contacted by phone or by letter within the next 1-3 weeks.  Please call us at [phone] if you have not heard about the biopsies in 3 weeks.    SIGNATURES/CONFIDENTIALITY: You and/or your care partner have signed paperwork which will be entered into your electronic medical record.  These signatures attest to the fact that that the information above on your After Visit Summary has been reviewed and is understood.  Full responsibility of the confidentiality of this discharge information lies with  you and/or your care-partner.

## 2023-09-24 NOTE — Progress Notes (Signed)
[facility] History and Physical   Primary Care Physician:  Joaquim Nam, MD   Reason for Procedure:   Change in bowel habits + Hx polyps  Plan:    colonoscopy     HPI: James [name] is a 78 y.o. male The patient, a 78 year old with a history of polyps, atrial fibrillation, and hemorrhoids, presents to schedule a colonoscopy for  polyp surveillance. He has been experiencing difficulty initiating bowel movements, occurring approximately twice a week. The patient struggles to produce a bowel movement when the urge arises at times. He has been taking a daily teaspoon of Metamucil, but has not tried other laxatives. The patient associates these symptoms with his hemorrhoids, but acknowledges uncertainty about this connection. This change in bowel habits has been ongoing for the past one to two months. The patient is also on Eliquis for stroke prevention in the context of his atrial fibrillation. Last colonoscopy 10/19/19   - Three 1 to 2 mm polyps in the transverse colon                            and in the ascending colon, removed with a cold                            biopsy forceps. Resected and retrieved. ADENOMAS                           - Diverticulosis in the entire examined colon.                           - Internal hemorrhoids.                           - The examination was otherwise normal on direct                            and retroflexion views. There were tattoos in                            ascending colon from marking of large polypectomy                            site 2017                           - Personal history of colonic polyps. 2017 25 mm tv                            adenoma + other diminutive adenomas, 2018 3                            diminutive adenomas and no residual 25 mm polyp   EF 55-60% 04/16/23     3/23 visit with me for globus - negative ba swallow + tablet 3/24 and 3/23 (mild dysmotillity then) and occasional globus now but he  has not had true dysphagia   Past Medical History:  Diagnosis Date   (HFpEF) heart failure with preserved ejection fraction (HCC)  a. 05/2018 Echo: EF 55-60%, gr2 DD.   Acute lower GI bleeding after colonoscopy and polypectomy, resolved 12/12/2015   Ascending aortic aneurysm (HCC)    a. 02/2018 Echo: mildly dil Ao root/asc ao/arch; b. 05/2018 CTA Chest: 4.7cm fusiform aneurysm of Asc Ao.   CAD (coronary artery disease)    a. 05/2018 Cath/PCI: LM nl,LAD 30p, 90/99d/apical, LCX 67m, OM1/2/3 min irregs, RCA 85p (3.5x18 [place] DES).   Cardiac murmur    a. 02/2018 Echo: EF 60-65%, no rwma, mild AI, mildly dil Ao root/Asc Ao/Arch, Mild MR, mildly dil LA. Nl RV fxn.   Cataract    bilateral surgery to remove   CHF (congestive heart failure) (HCC)    Colon polyp    Constipation    Dysrhythmia    Hx A. Fib   Essential hypertension    Hemorrhoids    Hepatitis    self resolved, likely food exposure, 1974.    History of cardiovascular stress test    a. 02/2018 Myoview: EF 55-65%, small/mild apical defect w/ nl wall motion ->attenuation artifact. No ischemia. Low risk study.   Hx of adenomatous colonic polyps 12/05/2015   Hyperlipidemia    Hypothyroidism    Mitral regurgitation    a. 110/2019 Echo: EF 55-60%. Gr2 DD. Triv AI. Ao root 38mm. Mild MR. Mod dil LA, mildly dil RA.   Osteoarthritis    Oxygen deficiency    Persistent atrial fibrillation (HCC)    a. Dx 02/2018. CHA2DS2VASc = 2-->Eliquis; b. 03/2018 DCCV (200J); c. 03/2018 recurrent Afib-->flecainide started 04/2018;  d. 05/06/2018 s/p successful DCCV - 200J x 2-->on amio.   Skin cancer    basal cell, R ear, MOHS    Past Surgical History:  Procedure Laterality Date   BROW LIFT Bilateral 10/23/2016   Procedure: BLEPHAROPLASTY upper eyelid with excess skin;  Surgeon: Imagene Riches, MD;  Location: [facility];  Service: Ophthalmology;  Laterality: Bilateral;  MAC   CARDIOVERSION N/A 03/18/2018   Procedure: CARDIOVERSION;  Surgeon:  Yvonne Kendall, MD;  Location: [facility] OR[facility];  Service: Cardiovascular;  Laterality: N/A;   CARDIOVERSION N/A 05/06/2018   Procedure: CARDIOVERSION;  Surgeon: Yvonne Kendall, MD;  Location: [facility] OR[facility];  Service: Cardiovascular;  Laterality: N/A;   CATARACT EXTRACTION  1986   OD   CATARACT EXTRACTION Bilateral 1995   x 2 for right and left    COLONOSCOPY     CORONARY STENT INTERVENTION N/A 05/13/2018   Procedure: CORONARY STENT INTERVENTION;  Surgeon: Yvonne Kendall, MD;  Location: [facility];  Service: Cardiovascular;  Laterality: N/A;   ECTROPION REPAIR Bilateral 10/23/2016   Procedure: REPAIR OF ECTROPION sutures, extensive;  Surgeon: Imagene Riches, MD;  Location: [facility];  Service: Ophthalmology;  Laterality: Bilateral;   LIPOMA EXCISION  06/1997   left neck (Juengel)   MOHS SURGERY Right 2013   behind right ear   RIGHT/LEFT HEART CATH AND CORONARY ANGIOGRAPHY N/A 05/12/2018   Procedure: RIGHT/LEFT HEART CATH AND CORONARY ANGIOGRAPHY;  Surgeon: Iran Ouch, MD;  Location: [facility] INVAS[facility] CV [facility];  Service: Cardiovascular;  Laterality: N/A;   TONSILLECTOMY     TOTAL KNEE ARTHROPLASTY Left 01/17/2023   Procedure: LEFT TOTAL KNEE ARTHROPLASTY;  Surgeon: Cammy Copa, MD;  Location: [facility] OR;  Service: Orthopedics;  Laterality: Left;   VASECTOMY  1982    Prior to Admission medications   Medication Sig Start Date End Date Taking? Authorizing Provider  acetaminophen (TYLENOL) 500 MG tablet Take 1,000 mg by mouth every  6 (six) hours as needed for mild pain or moderate pain.    [provider]  amoxicillin (AMOXIL) 500 MG tablet 2g po 1 hr prior to dental procedure. 02/28/23   Cammy Copa, MD  apixaban (ELIQUIS) 5 MG TABS tablet Take 1 tablet (5 mg total) by mouth 2 (two) times daily. 09/16/23   Joaquim Nam, MD  atorvastatin (LIPITOR) 80 MG tablet TAKE 1 TABLET BY MOUTH DAILY AT 6:00PM 09/23/23   End, Cristal Deer, MD  carboxymethylcellulose (REFRESH  PLUS) 0.5 % SOLN Place 1-2 drops into both eyes 3 (three) times daily as needed (dry/irritated eyes.).    [provider]  diclofenac Sodium (VOLTAREN) 1 % GEL Apply 2 g topically 4 (four) times daily as needed. Patient taking differently: Apply 2 g topically daily. 09/05/20   Joaquim Nam, MD  dofetilide (TIKOSYN) 500 MCG capsule Take 1 capsule (500 mcg total) by mouth 2 (two) times daily. 05/03/23   Sherie Don, NP  doxazosin (CARDURA) 4 MG tablet TAKE 1 TABLET BY MOUTH DAILY AFTER BREAKFAST 09/23/23   Joaquim Nam, MD  ezetimibe (ZETIA) 10 MG tablet TAKE 1 TABLET BY MOUTH DAILY 01/14/23   End, Cristal Deer, MD  finasteride (PROSCAR) 5 MG tablet TAKE 1 TABLET BY MOUTH EVERY DAY 09/23/23   Joaquim Nam, MD  furosemide (LASIX) 20 MG tablet TAKE 1 TABLET BY MOUTH EVERY OTHER DAY GENERIC EQUIVALENT FOR LASIX 07/17/23   Joaquim Nam, MD  levothyroxine (SYNTHROID) 150 MCG tablet Take 1 tablet (150 mcg total) by mouth daily before breakfast. Except skip Sundays.  6 pill per week. 03/22/23   Sherie Don, NP  methocarbamol (ROBAXIN) 500 MG tablet Take 1 tablet (500 mg total) by mouth every 8 (eight) hours as needed for muscle spasms. 01/18/23   Magnant, Charles L, PA-C  metoprolol succinate (TOPROL-XL) 25 MG 24 hr tablet TAKE 1 TABLET(25 MG) BY MOUTH DAILY 08/05/23   End, Cristal Deer, MD  Multiple Vitamin (MULTIVITAMIN WITH MINERALS) TABS tablet Take 1 tablet by mouth daily. Senior Multivitamin    [provider]  psyllium (METAMUCIL) 58.6 % packet Take 1 packet by mouth daily as needed (constipation).    [provider]  ramipril (ALTACE) 10 MG capsule TAKE ONE CAPSULE BY MOUTH DAILY GENERIC EQUIVALENT FOR ALTACE 09/23/23   Joaquim Nam, MD    Current Outpatient Medications  Medication Sig Dispense Refill   atorvastatin (LIPITOR) 80 MG tablet TAKE 1 TABLET BY MOUTH DAILY AT 6:00PM 90 tablet 3   dofetilide (TIKOSYN) 500 MCG capsule Take 1 capsule (500 mcg  total) by mouth 2 (two) times daily. 180 capsule 2   doxazosin (CARDURA) 4 MG tablet TAKE 1 TABLET BY MOUTH DAILY AFTER BREAKFAST 90 tablet 0   ezetimibe (ZETIA) 10 MG tablet TAKE 1 TABLET BY MOUTH DAILY 90 tablet 1   finasteride (PROSCAR) 5 MG tablet TAKE 1 TABLET BY MOUTH EVERY DAY 90 tablet 3   furosemide (LASIX) 20 MG tablet TAKE 1 TABLET BY MOUTH EVERY OTHER DAY GENERIC EQUIVALENT FOR LASIX 45 tablet 1   levothyroxine (SYNTHROID) 150 MCG tablet Take 1 tablet (150 mcg total) by mouth daily before breakfast. Except skip Sundays.  6 pill per week. 90 tablet 3   metoprolol succinate (TOPROL-XL) 25 MG 24 hr tablet TAKE 1 TABLET(25 MG) BY MOUTH DAILY 90 tablet 0   ramipril (ALTACE) 10 MG capsule TAKE ONE CAPSULE BY MOUTH DAILY GENERIC EQUIVALENT FOR ALTACE 90 capsule 0   acetaminophen (TYLENOL) 500  MG tablet Take 1,000 mg by mouth every 6 (six) hours as needed for mild pain or moderate pain.     amoxicillin (AMOXIL) 500 MG tablet 2g po 1 hr prior to dental procedure. 10 tablet 0   apixaban (ELIQUIS) 5 MG TABS tablet Take 1 tablet (5 mg total) by mouth 2 (two) times daily. 180 tablet 1   carboxymethylcellulose (REFRESH PLUS) 0.5 % SOLN Place 1-2 drops into both eyes 3 (three) times daily as needed (dry/irritated eyes.).     diclofenac Sodium (VOLTAREN) 1 % GEL Apply 2 g topically 4 (four) times daily as needed. (Patient taking differently: Apply 2 g topically daily.)     methocarbamol (ROBAXIN) 500 MG tablet Take 1 tablet (500 mg total) by mouth every 8 (eight) hours as needed for muscle spasms. 30 tablet 0   Multiple Vitamin (MULTIVITAMIN WITH MINERALS) TABS tablet Take 1 tablet by mouth daily. Senior Multivitamin     psyllium (METAMUCIL) 58.6 % packet Take 1 packet by mouth daily as needed (constipation).     Current Facility-Administered Medications  Medication Dose Route Frequency Provider Last Rate Last Admin   0.9 %  sodium chloride infusion  500 mL Intravenous Once Iva Boop, MD         Allergies as of 09/24/2023 - Review Complete 09/24/2023  Allergen Reaction Noted   Sulfonamide derivatives Rash 02/21/2007    Family History  Problem Relation Age of Onset   Stroke Mother    Lung cancer Maternal Grandfather        smoker   Stroke Maternal Grandfather    Heart disease Paternal Grandfather        MI, old age   Colon cancer Neg Hx    Colon polyps Neg Hx    Esophageal cancer Neg Hx    Rectal cancer Neg Hx    Stomach cancer Neg Hx    Bladder Cancer Neg Hx    Prostate cancer Neg Hx     Social History   Socioeconomic History   Marital status: Married    Spouse name: [name]   Number of children: 1   Years of education: 14   Highest education level: Tax adviser degree: occupational, Scientist, product/process development, or vocational program  Occupational History   Occupation: Retired    Associate Professor: RETIRED    Comment: 1  Tobacco Use   Smoking status: Former    Current packs/day: 0.00    Average packs/day: 2.0 packs/day for 20.0 years (40.0 ttl pk-yrs)    Types: Cigarettes    Start date: 08/07/1963    Quit date: 08/07/1983    Years since quitting: 40.1    Passive exposure: Past   Smokeless tobacco: Never   Tobacco comments:    Former smoker 11/17/21 quit over 40 years ago  Vaping Use   Vaping status: Never Used  Substance and Sexual Activity   Alcohol use: Yes    Comment: 1-2 per day   Drug use: No   Sexual activity: Yes    Partners: Female  Other Topics Concern   Not on file  Social History Narrative   Married and lives with wife 1974   One daughter, not local   Retired from [employer], sea based telecommunication/contracting   Social Drivers of Health   Financial Resource Strain: Low Risk  (08/21/2023)   Overall Financial Resource Strain (CARDIA)    Difficulty of Paying Living Expenses: Not hard at all  Food Insecurity: No Food Insecurity (08/21/2023)   Hunger Vital Sign  Worried About Programme researcher, broadcasting/film/video in the Last Year: Never true    Ran Out of Food in the Last Year:  Never true  Transportation Needs: No Transportation Needs (08/21/2023)   PRAPARE - Administrator, Civil Service (Medical): No    Lack of Transportation (Non-Medical): No  Physical Activity: Insufficiently Active (08/21/2023)   Exercise Vital Sign    Days of Exercise per Week: 5 days    Minutes of Exercise per Session: 20 min  Stress: No Stress Concern Present (08/21/2023)   [employer] of Occupational Health - Occupational Stress Questionnaire    Feeling of Stress : Not at all  Social Connections: Socially Isolated (08/21/2023)   Social Connection and Isolation Panel [NHANES]    Frequency of Communication with Friends and Family: Once a week    Frequency of Social Gatherings with Friends and Family: Once a week    Attends Religious Services: Never    Database administrator or Organizations: Yes    Attends Banker Meetings: 1 to 4 times per year    Marital Status: Widowed  Intimate Partner Violence: Not At Risk (08/21/2023)   Humiliation, Afraid, Rape, and Kick questionnaire    Fear of Current or Ex-Partner: No    Emotionally Abused: No    Physically Abused: No    Sexually Abused: No    Review of Systems:  All other review of systems negative except as mentioned in the HPI.  Physical Exam: Vital signs BP (!) 165/100   Pulse 75   Temp (!) 97.3 F (36.3 C)   Resp 10   Ht 6' (1.829 m)   Wt 258 lb (117 kg)   SpO2 98%   BMI 34.99 kg/m   General:   Alert,  Well-developed, well-nourished, pleasant and cooperative in NAD Lungs:  Clear throughout to auscultation.   Heart:  Regular rate and rhythm; no murmurs, clicks, rubs,  or gallops. Abdomen:  Soft, nontender and nondistended. Normal bowel sounds.   Neuro/Psych:  Alert and cooperative. Normal mood and affect. A and O x 3   @Makyah Lavigne  Sena Slate, MD, [facility] [phone] (pager) 09/24/2023 1:40 PM@

## 2023-09-24 NOTE — Progress Notes (Signed)
1347 BP 171/106, Labetalol given IV, MD update, vss

## 2023-09-24 NOTE — Op Note (Signed)
[facility] Patient Name: James [name] Procedure Date: 09/24/2023 1:13 PM MRN: [id number] Endoscopist: Iva Boop , MD, [id number] Age: 78 Referring MD:  Date of Birth: [birth year] Gender: Male Account #: [id number] Procedure:                Colonoscopy Indications:              Change in bowel habits + hx colon polyps Medicines:                Monitored Anesthesia Care Procedure:                Pre-Anesthesia Assessment:                           - Prior to the procedure, a History and Physical                            was performed, and patient medications and                            allergies were reviewed. The patient's tolerance of                            previous anesthesia was also reviewed. The risks                            and benefits of the procedure and the sedation                            options and risks were discussed with the patient.                            All questions were answered, and informed consent                            was obtained. Prior Anticoagulants: The patient                            last took Eliquis (apixaban) 2 days prior to the                            procedure. ASA Grade Assessment: III - A patient                            with severe systemic disease. After reviewing the                            risks and benefits, the patient was deemed in                            satisfactory condition to undergo the procedure.                           After obtaining informed consent, the colonoscope  was passed under direct vision. Throughout the                            procedure, the patient's blood pressure, pulse, and                            oxygen saturations were monitored continuously. The                            Olympus Scope [id number] was introduced through the                            anus and advanced to the the cecum, identified by                             appendiceal orifice and ileocecal valve. The                            colonoscopy was performed without difficulty. The                            patient tolerated the procedure well. The quality                            of the bowel preparation was good. The ileocecal                            valve, appendiceal orifice, and rectum were                            photographed. The bowel preparation used was SUPREP                            via split dose instruction. Scope In: 1:45:28 PM Scope Out: 1:57:00 PM Scope Withdrawal Time: 0 hours 9 minutes 2 seconds  Total Procedure Duration: 0 hours 11 minutes 32 seconds  Findings:                 The perianal and digital rectal examinations were                            normal.                           Two sessile polyps were found in the transverse                            colon and ascending colon. The polyps were 2 to 7                            mm in size. These polyps were removed with a cold                            snare. Resection and retrieval were complete.  Verification of patient identification for the                            specimen was done. Estimated blood loss was minimal.                           Multiple diverticula were found in the entire colon.                           A tattoo was seen in the transverse colon.                           Internal hemorrhoids were found.                           The exam was otherwise without abnormality on                            direct and retroflexion views. Complications:            No immediate complications. Estimated Blood Loss:     Estimated blood loss was minimal. Impression:               - Two 2 to 7 mm polyps in the transverse colon and                            in the ascending colon, removed with a cold snare.                            Resected and retrieved.                           - Diverticulosis in the entire examined  colon.                           - A tattoo was seen in the transverse colon.                           - Internal hemorrhoids.                           - The examination was otherwise normal on direct                            and retroflexion views.                           - Personal history of colonic polyps. 12/2015 5                            adenomas largest 25 mm - had transient bleed                           08/2016 3 polyps removed max 6 mm - adenomas recall  10/2019 3 adenomas Recommendation:           - Patient has a contact number available for                            emergencies. The signs and symptoms of potential                            delayed complications were discussed with the                            patient. Return to normal activities tomorrow.                            Written discharge instructions were provided to the                            patient.                           - Resume previous diet. + increase dietary fiber                            and Metamucil up to 2 tbsp/day- Await pathology                            results. Iva Boop, MD 09/24/2023 2:09:00 PM This report has been signed electronically.

## 2023-09-24 NOTE — Progress Notes (Signed)
 Report given to PACU, vss

## 2023-09-24 NOTE — Progress Notes (Signed)
 Called to room to assist during endoscopic procedure.  Patient ID and intended procedure confirmed with present staff. Received instructions for my participation in the procedure from the performing physician.

## 2023-09-25 ENCOUNTER — Telehealth: Payer: Self-pay | Admitting: *Deleted

## 2023-09-25 NOTE — Telephone Encounter (Signed)
  Follow up Call-     09/24/2023   12:32 PM  Call back number  Post procedure Call Back phone  # [phone]  Permission to leave phone message Yes     Patient questions:  Do you have a fever, pain , or abdominal swelling? No. Pain Score  0 *  Have you tolerated food without any problems? Yes.    Have you been able to return to your normal activities? Yes.    Do you have any questions about your discharge instructions: Diet   No. Medications  No. Follow up visit  No.  Do you have questions or concerns about your Care? No.  Actions: * If pain score is 4 or above: No action needed, pain <4.

## 2023-09-27 ENCOUNTER — Encounter: Payer: Self-pay | Admitting: Internal Medicine

## 2023-09-27 LAB — SURGICAL PATHOLOGY

## 2023-09-30 ENCOUNTER — Encounter: Payer: Self-pay | Admitting: Family Medicine

## 2023-09-30 ENCOUNTER — Ambulatory Visit (INDEPENDENT_AMBULATORY_CARE_PROVIDER_SITE_OTHER): Payer: Medicare Other | Admitting: Family Medicine

## 2023-09-30 VITALS — BP 120/74 | HR 45 | Temp 98.0°F | Ht 72.0 in | Wt 255.2 lb

## 2023-09-30 DIAGNOSIS — R351 Nocturia: Secondary | ICD-10-CM

## 2023-09-30 DIAGNOSIS — Z0000 Encounter for general adult medical examination without abnormal findings: Secondary | ICD-10-CM

## 2023-09-30 DIAGNOSIS — I4819 Other persistent atrial fibrillation: Secondary | ICD-10-CM

## 2023-09-30 DIAGNOSIS — N401 Enlarged prostate with lower urinary tract symptoms: Secondary | ICD-10-CM

## 2023-09-30 DIAGNOSIS — I10 Essential (primary) hypertension: Secondary | ICD-10-CM

## 2023-09-30 DIAGNOSIS — E039 Hypothyroidism, unspecified: Secondary | ICD-10-CM

## 2023-09-30 DIAGNOSIS — Z7189 Other specified counseling: Secondary | ICD-10-CM

## 2023-09-30 DIAGNOSIS — E785 Hyperlipidemia, unspecified: Secondary | ICD-10-CM

## 2023-09-30 MED ORDER — DOXAZOSIN MESYLATE 4 MG PO TABS: 4.0000 mg | ORAL_TABLET | Freq: Every day | ORAL | 3 refills | Status: AC

## 2023-09-30 MED ORDER — RAMIPRIL 10 MG PO CAPS: 10.0000 mg | ORAL_CAPSULE | Freq: Every day | ORAL | 3 refills | Status: AC

## 2023-09-30 NOTE — Patient Instructions (Addendum)
 Tetanus shot when possible at the pharmacy.  Take care.  Glad to see you. If you have lightheadedness from low BP, then I would doxazosin in half.

## 2023-09-30 NOTE — Progress Notes (Unsigned)
 Shingrix 2021 Flu 2024 Tetanus 2009. PNA up to date.  covid vaccine 2022 Prostate cancer screening and PSA options (with potential risks and benefits of testing vs not testing) were discussed along with recent recs/guidelines.  He declined testing PSA at this point. Colonoscopy 2025 Wife would be designated if patient were incapacitated.   He had RSV vaccine 2023.    Still with some occ slower to transit with pills with swallowing.  Not getting choked.   Not worse than prior. I asked him to update me as needed.    Hypertension:    Using medication without problems or lightheadedness: occ lower BP noted but not lightheaded.   Chest pain with exertion: no Edema: some occ mild edema.   Short of breath: only with sig exertion.  At baseline and stable.   H/o bradycardia at baseline.  Pulse is higher with activity.  Not lightheaded.    Elevated Cholesterol: Using medications without problems:yes Muscle aches: no Diet compliance: yes Exercise: limited by recent weather, d/w pt.  He is starting to walk more.   He is managing BPH sx. Still on med tx.  He can update me as needed.   Hypothyroidism.   TSH wnl.  Still on levothyroxine.  Compliant.     H/o ascending thoracic aortic diameter followed by cardiology.   He has A fib.  Still anticoagulated, no bleeding.    Meds, vitals, and allergies reviewed.   ROS: Per HPI unless specifically indicated in ROS section   GEN: nad, alert and oriented HEENT: mucous membranes moist NECK: supple w/o LA CV: rrr PULM: ctab, no inc wob ABD: soft, +bs EXT: no edema SKIN: no acute rash

## 2023-10-02 NOTE — Assessment & Plan Note (Signed)
 He is managing BPH sx. Still on med tx.  He can update me as needed.  Continue doxazosin and finasteride.

## 2023-10-02 NOTE — Assessment & Plan Note (Signed)
 Continue ramipril and metoprolol.  Continue doxazosin. If lightheaded from low BP, then I would doxazosin in half. D/w pt.

## 2023-10-02 NOTE — Assessment & Plan Note (Signed)
 Shingrix 2021 Flu 2024 Tetanus 2009. PNA up to date.  covid vaccine 2022 Prostate cancer screening and PSA options (with potential risks and benefits of testing vs not testing) were discussed along with recent recs/guidelines.  He declined testing PSA at this point. Colonoscopy 2025 Wife would be designated if patient were incapacitated.   He had RSV vaccine 2023.

## 2023-10-02 NOTE — Assessment & Plan Note (Signed)
 TSH wnl.  Still on levothyroxine.  Compliant.  continue as is.

## 2023-10-02 NOTE — Assessment & Plan Note (Signed)
 Continue zetia and atorvastatin

## 2023-10-02 NOTE — Assessment & Plan Note (Signed)
 Continue statin, tikosyn, metoprolol, eliquis.

## 2023-10-08 DIAGNOSIS — Z961 Presence of intraocular lens: Secondary | ICD-10-CM | POA: Diagnosis not present

## 2023-10-08 DIAGNOSIS — H26491 Other secondary cataract, right eye: Secondary | ICD-10-CM | POA: Diagnosis not present

## 2023-10-08 DIAGNOSIS — H35372 Puckering of macula, left eye: Secondary | ICD-10-CM | POA: Diagnosis not present

## 2023-10-22 ENCOUNTER — Other Ambulatory Visit: Payer: Self-pay | Admitting: Orthopedic Surgery

## 2023-10-22 DIAGNOSIS — K080 Exfoliation of teeth due to systemic causes: Secondary | ICD-10-CM | POA: Diagnosis not present

## 2023-11-07 ENCOUNTER — Other Ambulatory Visit (HOSPITAL_COMMUNITY): Payer: Self-pay

## 2023-11-07 NOTE — Progress Notes (Unsigned)
 Electrophysiology Clinic Note    Date:  11/08/2023  Patient ID:  James, [name] 1945/12/01, MRN [id number] PCP:  Joaquim Nam, MD  Cardiologist:  Yvonne Kendall, MD Electrophysiologist: Lanier Prude, MD    Discussed the use of AI scribe software for clinical note transcription with the patient, who gave verbal consent to proceed.   Patient Profile    Chief Complaint: AFib follow-up  History of Present Illness: James [name] is a 78 y.o. male with PMH notable for persis AFib, HFpEF, CAD s/p PCI, HTN, thoracic aortic aneurysm, hypothyroid ; seen today for Lanier Prude, MD for routine electrophysiology followup.   I last saw him 05/2023 where he had rare AFib episodes on tikosyn BID.  On follow-up today, he is doing very well. He had maybe a brief episode where he thought he might be going into Afib. By the time he could get his kardiaMobile, the episode had ended. He continues to take tikosyn diligently 8:30am/pm, uses alarms on phone to help remember.  He denies bleeding concerns on eliquis.  He has noticed some DOE when walking through neighborhood picking up trash. Does not have DOE when walking through neighborhood casually. He questions whether his bradycardia may be contributing to the DOE.     Arrhythmia/Device History Amiodarone, stopped to limit off-target effects Tikosyn, started 11/2021      ROS:  Please see the history of present illness. All other systems are reviewed and otherwise negative.    Physical Exam    VS:  BP 112/70 (BP Location: Left Arm, Patient Position: Sitting, Cuff Size: Normal)   Pulse (!) 50   Ht 6' (1.829 m)   Wt 259 lb (117.5 kg)   SpO2 98%   BMI 35.13 kg/m  BMI: Body mass index is 35.13 kg/m.  Wt Readings from Last 3 Encounters:  11/08/23 259 lb (117.5 kg)  09/30/23 255 lb 3.2 oz (115.8 kg)  09/24/23 258 lb (117 kg)     GEN- The patient is well appearing, alert and oriented x 3 today.   Lungs-  Clear to ausculation bilaterally, normal work of breathing.  Heart- Regular rate and rhythm, no murmurs, rubs or gallops Extremities- Trace peripheral edema, warm, dry    Studies Reviewed   Previous EP, cardiology notes.    EKG is ordered. Personal review of EKG from today shows:    EKG Interpretation Date/Time:  Friday November 08 2023 09:19:52 EDT Ventricular Rate:  50 PR Interval:  200 QRS Duration:  96 QT Interval:  464 QTC Calculation: 423 R Axis:   -41  Text Interpretation: Sinus bradycardia Left axis deviation Confirmed by Sherie Don [phone]) on 11/08/2023 9:22:54 AM    05/2023 EKG - SB at 53bpm, LAD; QTC  TTE, 9/10/204  1. Left ventricular ejection fraction, by estimation, is 55 to 60%. The left ventricle has normal function. The left ventricle has no regional wall motion abnormalities. Left ventricular diastolic parameters are consistent with Grade I diastolic dysfunction (impaired relaxation).   2. Right ventricular systolic function is normal. The right ventricular size is normal. Tricuspid regurgitation signal is inadequate for assessing PA pressure.   3. Left atrial size was mildly dilated.   4. The mitral valve is normal in structure. Mild mitral valve regurgitation. No evidence of mitral stenosis.   5. The aortic valve has an indeterminant number of cusps. Aortic valve regurgitation is not visualized. No aortic stenosis is present.   6. There is  moderate dilatation of the ascending aorta, measuring 47 mm.   7. The inferior vena cava is normal in size with greater than 50% respiratory variability, suggesting right atrial pressure of 3 mmHg.    NM myocardial spect, 09/09/2020 Pharmacological myocardial perfusion imaging study with no significant ischemia Small fixed apical defect consistent with prior MI, though attenuation artifact cannot be excluded. Normal wall motion, EF estimated at 57% No EKG changes concerning for ischemia at peak stress or in recovery. CT  attenuation correction images with mild aortic atherosclerosis, moderate three-vessel coronary calcification Low risk scan   TTE, 06/19/2019  1. Left ventricular ejection fraction, by visual estimation, is 55 to 60%. The left ventricle has normal function. There is no left ventricular hypertrophy.   2. Global right ventricle has normal systolic function.The right ventricular size is normal. No increase in right ventricular wall thickness.   3. Left atrial size was mild-moderately dilated.   4. Right atrial size was normal.   5. Mild to moderate mitral valve regurgitation.   6. There is mild dilatation of the aortic root measuring 38 mm. Aortic arch 3.8 cm   7. Mildly elevated pulmonary artery systolic pressure.      Assessment and Plan     #) parox afib #) tikosyn monitoring #) bradycardia AFib well-controlled on tikosyn BID EKG with stable intervals, QTC Will stop metoprolol to see if helps with DOE. If no change, consider ischemic eval Update BMP, Mag  #) Hypercoag d/t parox afib CHA2DS2-VASc Score = at least 5 [CHF History: 1, HTN History: 1, Diabetes History: 0, Stroke History: 0, Vascular Disease History: 1, Age Score: 2, Gender Score: 0].  Therefore, the patient's annual risk of stroke is 7.2 %.    Stroke ppx - 5mg  eliquis BID, appropriately dosed No bleeding concerns         Current medicines are reviewed at length with the patient today.   The patient has concerns regarding his medicines.  The following changes were made today:   STOP metoprolol  Labs/ tests ordered today include:  Orders Placed This Encounter  Procedures   Basic metabolic panel with GFR   Magnesium   EKG 12-Lead     Disposition: Follow up with Dr. Lalla Brothers or EP APP  4-6 months    Signed, Sherie Don, NP  11/08/23  9:47 AM  Electrophysiology [facility]

## 2023-11-08 ENCOUNTER — Encounter: Payer: Self-pay | Admitting: Cardiology

## 2023-11-08 ENCOUNTER — Ambulatory Visit: Payer: Medicare Other | Attending: Cardiology | Admitting: Cardiology

## 2023-11-08 VITALS — BP 112/70 | HR 50 | Ht 72.0 in | Wt 259.0 lb

## 2023-11-08 DIAGNOSIS — Z5181 Encounter for therapeutic drug level monitoring: Secondary | ICD-10-CM | POA: Diagnosis not present

## 2023-11-08 DIAGNOSIS — R001 Bradycardia, unspecified: Secondary | ICD-10-CM | POA: Diagnosis not present

## 2023-11-08 DIAGNOSIS — Z79899 Other long term (current) drug therapy: Secondary | ICD-10-CM | POA: Diagnosis not present

## 2023-11-08 DIAGNOSIS — I480 Paroxysmal atrial fibrillation: Secondary | ICD-10-CM

## 2023-11-08 DIAGNOSIS — D6869 Other thrombophilia: Secondary | ICD-10-CM

## 2023-11-08 NOTE — Patient Instructions (Signed)
 Medication Instructions:  Your physician recommends the following medication changes.  STOP TAKING:  Metoprolol Succinate   *If you need a refill on your cardiac medications before your next appointment, please call your pharmacy*  Lab Work: Your provider would like for you to have following labs drawn today BMP,MAG .   If you have labs (blood work) drawn today and your tests are completely normal, you will receive your results only by: MyChart Message (if you have MyChart) OR A paper copy in the mail If you have any lab test that is abnormal or we need to change your treatment, we will call you to review the results.  Testing/Procedures: No test ordered today   Follow-Up: At [facility], you and your health needs are our priority.  As part of our continuing mission to provide you with exceptional heart care, our providers are all part of one team.  This team includes your primary Cardiologist (physician) and Advanced Practice Providers or APPs (Physician Assistants and Nurse Practitioners) who all work together to provide you with the care you need, when you need it.  Your next appointment:    4-6 month(s)  Provider:   Sherie Don, NP

## 2023-11-09 LAB — BASIC METABOLIC PANEL WITH GFR
BUN/Creatinine Ratio: 12 (ref 10–24)
BUN: 18 mg/dL (ref 8–27)
CO2: 23 mmol/L (ref 20–29)
Calcium: 9.4 mg/dL (ref 8.6–10.2)
Chloride: 104 mmol/L (ref 96–106)
Creatinine, Ser: 1.47 mg/dL — ABNORMAL HIGH (ref 0.76–1.27)
Glucose: 90 mg/dL (ref 70–99)
Potassium: 4.5 mmol/L (ref 3.5–5.2)
Sodium: 140 mmol/L (ref 134–144)
eGFR: 49 mL/min/{1.73_m2} — ABNORMAL LOW (ref >59)

## 2023-11-09 LAB — MAGNESIUM: Magnesium: 2 mg/dL (ref 1.6–2.3)

## 2023-12-05 ENCOUNTER — Other Ambulatory Visit: Payer: Self-pay | Admitting: Internal Medicine

## 2023-12-12 ENCOUNTER — Other Ambulatory Visit: Payer: Self-pay

## 2023-12-12 MED ORDER — EZETIMIBE 10 MG PO TABS: 10.0000 mg | ORAL_TABLET | Freq: Every day | ORAL | 3 refills | Status: DC

## 2023-12-13 ENCOUNTER — Other Ambulatory Visit: Payer: Self-pay | Admitting: Internal Medicine

## 2023-12-19 DIAGNOSIS — K080 Exfoliation of teeth due to systemic causes: Secondary | ICD-10-CM | POA: Diagnosis not present

## 2023-12-27 ENCOUNTER — Other Ambulatory Visit: Payer: Self-pay | Admitting: Surgical

## 2024-01-01 DIAGNOSIS — K080 Exfoliation of teeth due to systemic causes: Secondary | ICD-10-CM | POA: Diagnosis not present

## 2024-01-21 ENCOUNTER — Ambulatory Visit
Admission: RE | Admit: 2024-01-21 | Discharge: 2024-01-21 | Disposition: A | Discharge status: Home / Self Care | Payer: Medicare Other | Source: Ambulatory Visit | Attending: Internal Medicine | Admitting: Internal Medicine

## 2024-01-21 DIAGNOSIS — N281 Cyst of kidney, acquired: Secondary | ICD-10-CM | POA: Diagnosis not present

## 2024-01-21 DIAGNOSIS — I712 Thoracic aortic aneurysm, without rupture, unspecified: Secondary | ICD-10-CM | POA: Insufficient documentation

## 2024-01-21 DIAGNOSIS — I2510 Atherosclerotic heart disease of native coronary artery without angina pectoris: Secondary | ICD-10-CM | POA: Diagnosis not present

## 2024-01-21 DIAGNOSIS — I7121 Aneurysm of the ascending aorta, without rupture: Secondary | ICD-10-CM | POA: Diagnosis not present

## 2024-01-21 DIAGNOSIS — J439 Emphysema, unspecified: Secondary | ICD-10-CM | POA: Diagnosis not present

## 2024-01-21 LAB — POCT I-STAT CREATININE: Creatinine, Ser: 1.3 mg/dL — ABNORMAL HIGH (ref 0.61–1.24)

## 2024-01-21 MED ORDER — IOHEXOL 350 MG/ML SOLN
75.0000 mL | Freq: Once | INTRAVENOUS | Status: AC | PRN
  Administered 2024-01-21: 75 mL via INTRAVENOUS

## 2024-01-26 ENCOUNTER — Other Ambulatory Visit: Payer: Self-pay | Admitting: Family Medicine

## 2024-02-01 ENCOUNTER — Ambulatory Visit: Payer: Self-pay | Admitting: Internal Medicine

## 2024-02-01 DIAGNOSIS — R911 Solitary pulmonary nodule: Secondary | ICD-10-CM

## 2024-02-05 ENCOUNTER — Telehealth: Payer: Self-pay | Admitting: Internal Medicine

## 2024-02-05 MED ORDER — DOFETILIDE 500 MCG PO CAPS: 500.0000 ug | ORAL_CAPSULE | Freq: Two times a day (BID) | ORAL | 2 refills | Status: AC

## 2024-02-05 NOTE — Telephone Encounter (Signed)
*  STAT* If patient is at the pharmacy, call can be transferred to refill team.   1. Which medications need to be refilled? (please list name of each medication and dose if known)   dofetilide  (TIKOSYN ) 500 MCG capsule   2. Would you like to learn more about the convenience, safety, & potential cost savings by using the [facility]?   3. Are you open to using the [facility] (Type [facility]. ).  4. Which pharmacy/location (including street and city if local pharmacy) is medication to be sent to?  [id number] - [place], [place] - [address]   5. Do they need a 30 day or 90 day supply? 90 day  Patient stated he still has some medication.  Patient wants a call back to confirm prescription sent.

## 2024-02-05 NOTE — Telephone Encounter (Signed)
 Pt's medication was sent to pt's pharmacy as requested. Confirmation received.

## 2024-02-17 ENCOUNTER — Other Ambulatory Visit: Payer: Self-pay | Admitting: Internal Medicine

## 2024-02-27 DIAGNOSIS — D6869 Other thrombophilia: Secondary | ICD-10-CM | POA: Diagnosis not present

## 2024-03-02 ENCOUNTER — Telehealth: Payer: Self-pay | Admitting: Cardiology

## 2024-03-02 NOTE — Telephone Encounter (Signed)
 Patient c/o Palpitations:  STAT if patient reporting lightheadedness, shortness of breath, or chest pain  How long have you had palpitations/irregular HR/ Afib? Are you having the symptoms now? Woke up with it, yes  Are you currently experiencing lightheadedness, SOB or CP? Lightheadedness   Do you have a history of afib (atrial fibrillation) or irregular heart rhythm? yes  Have you checked your BP or HR? (document readings if available): 100/65 88  Are you experiencing any other symptoms? no

## 2024-03-02 NOTE — Telephone Encounter (Signed)
 Returned pt call; pt states that he woke up feeling bad; used his Kardia machine at 6:30 am which registered that he was AFIB; pt states that he has dizziness, SOB; he states he has had episodes like this in that past, but they usually go away quickly; pt state he still feels bad; prior to the call his BP 100/65 HR 88; pt states that he took all of his medications; had pt take his blood pressure again while on the phone and he states that it registered 85/63 HR 88; pt states he would like to be seen in the office;  there are currently no appointments available for today; however, I did book him to see our DOD on 7/29 @ 3:20p Advised him that if his symptoms worsen to call 911 or have someone drive him to the ED; pt verbalized understanding

## 2024-03-03 ENCOUNTER — Ambulatory Visit: Admitting: Cardiovascular Disease

## 2024-03-04 DIAGNOSIS — D2272 Melanocytic nevi of left lower limb, including hip: Secondary | ICD-10-CM | POA: Diagnosis not present

## 2024-03-04 DIAGNOSIS — L570 Actinic keratosis: Secondary | ICD-10-CM | POA: Diagnosis not present

## 2024-03-04 DIAGNOSIS — D225 Melanocytic nevi of trunk: Secondary | ICD-10-CM | POA: Diagnosis not present

## 2024-03-04 DIAGNOSIS — D2262 Melanocytic nevi of left upper limb, including shoulder: Secondary | ICD-10-CM | POA: Diagnosis not present

## 2024-03-04 DIAGNOSIS — D2261 Melanocytic nevi of right upper limb, including shoulder: Secondary | ICD-10-CM | POA: Diagnosis not present

## 2024-03-06 NOTE — Telephone Encounter (Signed)
 Called and spoke with patient. Patient states that his heart rate and blood pressures have improved and that is why he canceled his appointment. Patient states that he is not currently having any symptoms. Patient reports that he has an appointment with Suzann Riddle, NP on 03/09/24.

## 2024-03-08 ENCOUNTER — Encounter: Payer: Self-pay | Admitting: Cardiology

## 2024-03-08 NOTE — Progress Notes (Unsigned)
 Electrophysiology Clinic Note    Date:  03/09/2024  Patient ID:  James [name], James [name] Mar 25, 1946, MRN [id number] PCP:  Cleatus Arlyss RAMAN, MD  Cardiologist:  Lonni Hanson, MD   Electrophysiologist:  OLE ONEIDA HOLTS, MD  Electrophysiology APP:  Jerriann Schrom, NP     Discussed the use of AI scribe software for clinical note transcription with the patient, who gave verbal consent to proceed.   Patient Profile    Chief Complaint: AFib, tikosyn  follow-up  History of Present Illness: James [name] is a 78 y.o. male with PMH notable for persis AFib, HFpEF, CAD s/p PCI, HTN, thoracic aortic aneurysm, hypothyroid ; seen today for OLE ONEIDA HOLTS, MD for routine electrophysiology followup.   I last saw him 11/2023 for routine Tikosyn  follow-up.  During that visit he was doing very well from an A-fib perspective, ha had a very brief episode where he thought he may be in A-fib but was unable to capture it on cardia mobile before episode terminated.  He was having intermittent DOE and was bradycardic during the visit.  We stopped metoprolol  at that visit to see if it improved his symptoms. He messaged clinic after the visit that his blood pressure had rose and so he resumed his metoprolol   He called the clinic last week with an episode of A-fib that was lasted longer than it usually does.  BP during episode was 85/64 he was scheduled in a DOD slot but then canceled the appointment when his episode ended and he felt better.   On follow-up today, he estimates last week's AFib episode lasted about 5 hours. He has not had an AF episode in a long time so it was concerning to him. He denied LH, dizziness, or presyncope. Only symptom was unusual sensation in chest. He thinks he may have had another brief episode yesterday.  He continues to take tikosyn  at 8:30am/pm without missing doses. Also takes eliquis  BID, no bleeding concerns.   He has had no change in his lifestyle to account for the  increased AFib burden in the past week.   He denies snoring, daytime somnolence, or morning HA. Currently, he denies chest pain, palpitations, dyspnea, PND, orthopnea, nausea, vomiting, dizziness, syncope, edema, weight gain, or early satiety.      Arrhythmia/Device History Flecainide  - ineffective Amiodarone , stopped to limit off-target effects  Tikosyn , started 11/2021, on currently     ROS:  Please see the history of present illness. All other systems are reviewed and otherwise negative.    Physical Exam    VS:  BP 122/72 (BP Location: Left Arm, Patient Position: Sitting, Cuff Size: Normal)   Pulse (!) 50   Ht 5' 11 (1.803 m)   Wt 256 lb 12.8 oz (116.5 kg)   SpO2 98%   BMI 35.82 kg/m  BMI: Body mass index is 35.82 kg/m.      Wt Readings from Last 3 Encounters:  03/09/24 256 lb 12.8 oz (116.5 kg)  11/08/23 259 lb (117.5 kg)  09/30/23 255 lb 3.2 oz (115.8 kg)     GEN- The patient is well appearing, alert and oriented x 3 today.   Lungs- Clear to ausculation bilaterally, normal work of breathing.  Heart- Regular rate and rhythm, no murmurs, rubs or gallops Extremities- 1+ peripheral edema, warm, dry    Studies Reviewed   Previous EP, cardiology notes.    EKG is ordered. Personal review of EKG from today shows:    EKG Interpretation Date/Time:  Monday March 09 2024 09:15:57 EDT Ventricular Rate:  50 PR Interval:  200 QRS Duration:  94 QT Interval:  466 QTC Calculation: 424 R Axis:   -43  Text Interpretation: Sinus bradycardia Left axis deviation Confirmed by Christorpher Hisaw [phone]) on 03/09/2024 9:23:14 AM    11/2023 EKG - SB at 50, LAD; QTC  TTE, 9/10/204  1. Left ventricular ejection fraction, by estimation, is 55 to 60%. The left ventricle has normal function. The left ventricle has no regional wall motion abnormalities. Left ventricular diastolic parameters are consistent with Grade I diastolic dysfunction (impaired relaxation).   2. Right  ventricular systolic function is normal. The right ventricular size is normal. Tricuspid regurgitation signal is inadequate for assessing PA pressure.   3. Left atrial size was mildly dilated.   4. The mitral valve is normal in structure. Mild mitral valve regurgitation. No evidence of mitral stenosis.   5. The aortic valve has an indeterminant number of cusps. Aortic valve regurgitation is not visualized. No aortic stenosis is present.   6. There is moderate dilatation of the ascending aorta, measuring 47 mm.   7. The inferior vena cava is normal in size with greater than 50% respiratory variability, suggesting right atrial pressure of 3 mmHg.    NM myocardial spect, 09/09/2020 Pharmacological myocardial perfusion imaging study with no significant ischemia Small fixed apical defect consistent with prior MI, though attenuation artifact cannot be excluded. Normal wall motion, EF estimated at 57% No EKG changes concerning for ischemia at peak stress or in recovery. CT attenuation correction images with mild aortic atherosclerosis, moderate three-vessel coronary calcification Low risk scan   TTE, 06/19/2019  1. Left ventricular ejection fraction, by visual estimation, is 55 to 60%. The left ventricle has normal function. There is no left ventricular hypertrophy.   2. Global right ventricle has normal systolic function.The right ventricular size is normal. No increase in right ventricular wall thickness.   3. Left atrial size was mild-moderately dilated.   4. Right atrial size was normal.   5. Mild to moderate mitral valve regurgitation.   6. There is mild dilatation of the aortic root measuring 38 mm. Aortic arch 3.8 cm   7. Mildly elevated pulmonary artery systolic pressure.   Assessment and Plan     #) parox AFib #) tikosyn  monitoring #) bradycardia Had hours-long episode of AFib last week after not having any AFib episodes for several months, along with a brief episode yesterday We  discussed that this was not unreasonable, but would hopefully return to his usual AFib burden QTC stable on tikosyn  Continue to monitor AFib burden Continue 500mcg tikosyn  q12h, 25mg  toprol  daily Update BMP, mag today  #) Hypercoag d/t afib CHA2DS2-VASc Score = at least 5 [CHF History: 1, HTN History: 1, Diabetes History: 0, Stroke History: 0, Vascular Disease History: 1, Age Score: 2, Gender Score: 0].  Therefore, the patient's annual risk of stroke is 7.2 %.    Stroke ppx - 5mg  eliquis  BID, appropriately dosed No bleeding concerns Update CBC  #) HTN Well-controlled on 25mg  toprol , 10mg  ramipril     Current medicines are reviewed at length with the patient today.   The patient does not have concerns regarding his medicines.  The following changes were made today:  none  Labs/ tests ordered today include:  Orders Placed This Encounter  Procedures   Basic metabolic panel with GFR   CBC   Magnesium   EKG 12-Lead     Disposition: Follow up with  Dr. Cindie or EP APP in 3-4 months    Signed, Chantal Needle, NP  03/09/24  9:48 AM  Electrophysiology [facility]

## 2024-03-09 ENCOUNTER — Ambulatory Visit: Attending: Cardiology | Admitting: Cardiology

## 2024-03-09 VITALS — BP 122/72 | HR 50 | Ht 71.0 in | Wt 256.8 lb

## 2024-03-09 DIAGNOSIS — I480 Paroxysmal atrial fibrillation: Secondary | ICD-10-CM

## 2024-03-09 DIAGNOSIS — Z5181 Encounter for therapeutic drug level monitoring: Secondary | ICD-10-CM

## 2024-03-09 DIAGNOSIS — Z79899 Other long term (current) drug therapy: Secondary | ICD-10-CM

## 2024-03-09 DIAGNOSIS — D6869 Other thrombophilia: Secondary | ICD-10-CM

## 2024-03-09 NOTE — Patient Instructions (Addendum)
 Medication Instructions:  The current medical regimen is effective;  continue present plan and medications as directed. Please refer to the Current Medication list given to you today.   *If you need a refill on your cardiac medications before your next appointment, please call your pharmacy*  Lab Work: Your provider would like for you to have following labs drawn today BMET, MAG, CBC.    If you have labs (blood work) drawn today and your tests are completely normal, you will receive your results only by: MyChart Message (if you have MyChart) OR A paper copy in the mail If you have any lab test that is abnormal or we need to change your treatment, we will call you to review the results.  Follow-Up: At [facility], you and your health needs are our priority.  As part of our continuing mission to provide you with exceptional heart care, our providers are all part of one team.  This team includes your primary Cardiologist (physician) and Advanced Practice Providers or APPs (Physician Assistants and Nurse Practitioners) who all work together to provide you with the care you need, when you need it.  Your next appointment:   4 month(s)  Provider:   Ole Holts, MD or Suzann Riddle, NP    We recommend signing up for the patient portal called MyChart.  Sign up information is provided on this After Visit Summary.  MyChart is used to connect with patients for Virtual Visits (Telemedicine).  Patients are able to view lab/test results, encounter notes, upcoming appointments, etc.  Non-urgent messages can be sent to your provider as well.   To learn more about what you can do with MyChart, go to [email or web address].

## 2024-03-10 ENCOUNTER — Ambulatory Visit: Payer: Self-pay | Admitting: Cardiology

## 2024-03-10 LAB — BASIC METABOLIC PANEL WITH GFR
BUN/Creatinine Ratio: 16 (ref 10–24)
BUN: 23 mg/dL (ref 8–27)
CO2: 21 mmol/L (ref 20–29)
Calcium: 9.4 mg/dL (ref 8.6–10.2)
Chloride: 106 mmol/L (ref 96–106)
Creatinine, Ser: 1.4 mg/dL — ABNORMAL HIGH (ref 0.76–1.27)
Glucose: 67 mg/dL — ABNORMAL LOW (ref 70–99)
Potassium: 4.4 mmol/L (ref 3.5–5.2)
Sodium: 141 mmol/L (ref 134–144)
eGFR: 52 mL/min/1.73 — ABNORMAL LOW (ref >59)

## 2024-03-10 LAB — CBC
Hematocrit: 45.6 % (ref 37.5–51.0)
Hemoglobin: 14.6 g/dL (ref 13.0–17.7)
MCH: 30.7 pg (ref 26.6–33.0)
MCHC: 32 g/dL (ref 31.5–35.7)
MCV: 96 fL (ref 79–97)
Platelets: 148 x10E3/uL — ABNORMAL LOW (ref 150–450)
RBC: 4.75 x10E6/uL (ref 4.14–5.80)
RDW: 13.1 % (ref 11.6–15.4)
WBC: 9.2 x10E3/uL (ref 3.4–10.8)

## 2024-03-10 LAB — MAGNESIUM: Magnesium: 2.1 mg/dL (ref 1.6–2.3)

## 2024-03-19 ENCOUNTER — Other Ambulatory Visit: Payer: Self-pay | Admitting: Family Medicine

## 2024-03-19 DIAGNOSIS — I4891 Unspecified atrial fibrillation: Secondary | ICD-10-CM

## 2024-03-19 NOTE — Telephone Encounter (Signed)
 LOV: 09/30/23 NOV: nothing scheduled  Last Refill: apixaban  (ELIQUIS ) 5 MG TABS tablet  09/16/23 180 tablets 1 refill

## 2024-04-13 ENCOUNTER — Other Ambulatory Visit: Payer: Self-pay

## 2024-04-13 DIAGNOSIS — E039 Hypothyroidism, unspecified: Secondary | ICD-10-CM

## 2024-04-16 ENCOUNTER — Encounter: Payer: Self-pay | Admitting: Family Medicine

## 2024-04-19 ENCOUNTER — Other Ambulatory Visit: Payer: Self-pay | Admitting: Family Medicine

## 2024-04-19 DIAGNOSIS — E039 Hypothyroidism, unspecified: Secondary | ICD-10-CM

## 2024-04-19 MED ORDER — LEVOTHYROXINE SODIUM 150 MCG PO TABS: 150.0000 ug | ORAL_TABLET | Freq: Every day | ORAL | 3 refills | Status: AC

## 2024-06-08 ENCOUNTER — Encounter: Payer: Self-pay | Admitting: Radiology

## 2024-07-08 NOTE — Progress Notes (Unsigned)
 Electrophysiology Clinic Note    Date:  07/09/2024  Patient ID:  Keon, Pender Oct 19, 1945, MRN [id number] PCP:  Cleatus Arlyss RAMAN, MD  Cardiologist:  Lonni Hanson, MD   Electrophysiologist:  OLE ONEIDA HOLTS, MD  Electrophysiology APP:  Marque Bango, NP     Discussed the use of AI scribe software for clinical note transcription with the patient, who gave verbal consent to proceed.   Patient Profile    Chief Complaint: AFib, tikosyn  follow-up  History of Present Illness: CAELLUM MANCIL is a 78 y.o. male with PMH notable for persis AFib, HFpEF, CAD s/p PCI, HTN, thoracic aortic aneurysm, hypothyroid ; seen today for OLE ONEIDA HOLTS, MD for routine electrophysiology followup.   He initially saw Dr. Holts 2023 for EP consult of AFib, he was on amiodarone  at that time. Dr. Holts did not think he was a AF ablation candidate given elevated BMI and concern that thoracic aortic aneurysm needed surgical intervention soon.   I last saw him 03/2024 for routine tikosyn  follow-up where he had an increase in AFib burden including a single 5-hour long episode that was quite symptomatic, but burden remained overall low, so Tikosyn  was continued.   On follow-up today, he continues to have paroxysmal AFib episodes 2-3 times a week. Most episodes are a couple minutes in duration and overall do not limit his activity.  He continues to take tikosyn  and eliquis  BID without bleeding concerns.      Arrhythmia/Device History Flecainide  - ineffective Amiodarone , stopped to limit off-target effects  Tikosyn , started 11/2021, on currently     ROS:  Please see the history of present illness. All other systems are reviewed and otherwise negative.    Physical Exam    VS:  BP (!) 100/58 (BP Location: Left Arm, Patient Position: Sitting, Cuff Size: Normal)   Pulse 69   Ht 6' (1.829 m)   Wt 268 lb 6.4 oz (121.7 kg)   SpO2 99%   BMI 36.40 kg/m  BMI: Body mass index is 36.4  kg/m.      Wt Readings from Last 3 Encounters:  07/09/24 268 lb 6.4 oz (121.7 kg)  03/09/24 256 lb 12.8 oz (116.5 kg)  11/08/23 259 lb (117.5 kg)     GEN- The patient is well appearing, alert and oriented x 3 today.   Lungs- Clear to ausculation bilaterally, normal work of breathing.  Heart- Regular rate and rhythm, no murmurs, rubs or gallops Extremities- 1-2+ peripheral edema, warm, dry    Studies Reviewed   Previous EP, cardiology notes.    EKG is ordered. Personal review of EKG from today shows:    EKG Interpretation Date/Time:  Thursday July 09 2024 09:14:07 EST Ventricular Rate:  69 PR Interval:  182 QRS Duration:  90 QT Interval:  426 QTC Calculation: 456 R Axis:   -46  Text Interpretation: Normal sinus rhythm Left anterior fascicular block Confirmed by Ashiyah Pavlak [phone]) on 07/09/2024 9:30:27 AM    03/09/2024 EKG - SB at 50, LAD; QRS 424  11/2023 EKG - SB at 50, LAD; QTC  TTE, 9/10/204  1. Left ventricular ejection fraction, by estimation, is 55 to 60%. The left ventricle has normal function. The left ventricle has no regional wall motion abnormalities. Left ventricular diastolic parameters are consistent with Grade I diastolic dysfunction (impaired relaxation).   2. Right ventricular systolic function is normal. The right ventricular size is normal. Tricuspid regurgitation signal is inadequate for assessing PA pressure.  3. Left atrial size was mildly dilated.   4. The mitral valve is normal in structure. Mild mitral valve regurgitation. No evidence of mitral stenosis.   5. The aortic valve has an indeterminant number of cusps. Aortic valve regurgitation is not visualized. No aortic stenosis is present.   6. There is moderate dilatation of the ascending aorta, measuring 47 mm.   7. The inferior vena cava is normal in size with greater than 50% respiratory variability, suggesting right atrial pressure of 3 mmHg.    NM myocardial spect,  09/09/2020 Pharmacological myocardial perfusion imaging study with no significant ischemia Small fixed apical defect consistent with prior MI, though attenuation artifact cannot be excluded. Normal wall motion, EF estimated at 57% No EKG changes concerning for ischemia at peak stress or in recovery. CT attenuation correction images with mild aortic atherosclerosis, moderate three-vessel coronary calcification Low risk scan   TTE, 06/19/2019  1. Left ventricular ejection fraction, by visual estimation, is 55 to 60%. The left ventricle has normal function. There is no left ventricular hypertrophy.   2. Global right ventricle has normal systolic function.The right ventricular size is normal. No increase in right ventricular wall thickness.   3. Left atrial size was mild-moderately dilated.   4. Right atrial size was normal.   5. Mild to moderate mitral valve regurgitation.   6. There is mild dilatation of the aortic root measuring 38 mm. Aortic arch 3.8 cm   7. Mildly elevated pulmonary artery systolic pressure.   Assessment and Plan     #) parox AFib #) tikosyn  monitoring #) bradycardia Overall AFib burden is very manageable and low, but does continue to have parox afib episodes several times a week We briefly discussed whether AF ablation should be pursued, and he is interested in discussing more should he be a candidate for the procedure.  QTC stable on tikosyn , will continue tikosyn  q12h, 25mg  toprol  daily Update BMP, mag today  #) Hypercoag d/t afib CHA2DS2-VASc Score = at least 5 [CHF History: 1, HTN History: 1, Diabetes History: 0, Stroke History: 0, Vascular Disease History: 1, Age Score: 2, Gender Score: 0].  Therefore, the patient's annual risk of stroke is 7.2 %.    Stroke ppx - 5mg  eliquis  BID, appropriately dosed No bleeding concerns  #) HTN Well-controlled on 25mg  toprol , 10mg  ramipril     Current medicines are reviewed at length with the patient today.   The  patient does not have concerns regarding his medicines.  The following changes were made today:  none  Labs/ tests ordered today include:  Orders Placed This Encounter  Procedures   Basic metabolic panel with GFR   Magnesium   EKG 12-Lead     Disposition: Follow up with Dr. Cindie or EP APP in 3-4 months    Signed, Zuriah Bordas, NP  07/09/24  5:05 PM  Electrophysiology [facility]

## 2024-07-09 ENCOUNTER — Ambulatory Visit: Attending: Cardiology | Admitting: Cardiology

## 2024-07-09 ENCOUNTER — Encounter: Payer: Self-pay | Admitting: Cardiology

## 2024-07-09 VITALS — BP 100/58 | HR 69 | Ht 72.0 in | Wt 268.4 lb

## 2024-07-09 DIAGNOSIS — D6869 Other thrombophilia: Secondary | ICD-10-CM

## 2024-07-09 DIAGNOSIS — Z79899 Other long term (current) drug therapy: Secondary | ICD-10-CM

## 2024-07-09 DIAGNOSIS — Z5181 Encounter for therapeutic drug level monitoring: Secondary | ICD-10-CM

## 2024-07-09 DIAGNOSIS — I480 Paroxysmal atrial fibrillation: Secondary | ICD-10-CM | POA: Diagnosis not present

## 2024-07-09 NOTE — Patient Instructions (Signed)
 Medication Instructions:   Your physician recommends that you continue on your current medications as directed. Please refer to the Current Medication list given to you today.    *If you need a refill on your cardiac medications before your next appointment, please call your pharmacy*  Lab Work:  Your provider would like for you to have following labs drawn today BMP, Magnesium.    If you have labs (blood work) drawn today and your tests are completely normal, you will receive your results only by:  MyChart Message (if you have MyChart) OR  A paper copy in the mail If you have any lab test that is abnormal or we need to change your treatment, we will call you to review the results.  Testing/Procedures:  We will have your CT scheduled today.  Referrals:  None ordered at this time   Follow-Up:  At [facility], you and your health needs are our priority.  As part of our continuing mission to provide you with exceptional heart care, our providers are all part of one team.  This team includes your primary Cardiologist (physician) and Advanced Practice Providers or APPs (Physician Assistants and Nurse Practitioners) who all work together to provide you with the care you need, when you need it.  Your next appointment:    Next available appointment with Dr. Kennyth  Provider:    Fonda Kennyth, MD    We recommend signing up for the patient portal called MyChart.  Sign up information is provided on this After Visit Summary.  MyChart is used to connect with patients for Virtual Visits (Telemedicine).  Patients are able to view lab/test results, encounter notes, upcoming appointments, etc.  Non-urgent messages can be sent to your provider as well.   To learn more about what you can do with MyChart, go to [email or web address].

## 2024-07-10 ENCOUNTER — Ambulatory Visit: Payer: Self-pay | Admitting: Cardiology

## 2024-07-10 LAB — BASIC METABOLIC PANEL WITH GFR
BUN/Creatinine Ratio: 12 (ref 10–24)
BUN: 16 mg/dL (ref 8–27)
CO2: 24 mmol/L (ref 20–29)
Calcium: 9.1 mg/dL (ref 8.6–10.2)
Chloride: 108 mmol/L — ABNORMAL HIGH (ref 96–106)
Creatinine, Ser: 1.34 mg/dL — ABNORMAL HIGH (ref 0.76–1.27)
Glucose: 102 mg/dL — ABNORMAL HIGH (ref 70–99)
Potassium: 4.3 mmol/L (ref 3.5–5.2)
Sodium: 143 mmol/L (ref 134–144)
eGFR: 55 mL/min/{1.73_m2} — ABNORMAL LOW

## 2024-07-10 LAB — MAGNESIUM: Magnesium: 2.1 mg/dL (ref 1.6–2.3)

## 2024-07-15 ENCOUNTER — Ambulatory Visit: Admission: RE | Admit: 2024-07-15 | Discharge: 2024-07-15 | Attending: Internal Medicine | Admitting: Internal Medicine

## 2024-07-15 DIAGNOSIS — R911 Solitary pulmonary nodule: Secondary | ICD-10-CM | POA: Insufficient documentation

## 2024-07-15 DIAGNOSIS — I700 Atherosclerosis of aorta: Secondary | ICD-10-CM | POA: Diagnosis not present

## 2024-07-15 DIAGNOSIS — I7121 Aneurysm of the ascending aorta, without rupture: Secondary | ICD-10-CM | POA: Diagnosis not present

## 2024-07-17 ENCOUNTER — Other Ambulatory Visit: Payer: Self-pay | Admitting: Family Medicine

## 2024-07-17 DIAGNOSIS — I4891 Unspecified atrial fibrillation: Secondary | ICD-10-CM

## 2024-07-17 MED ORDER — APIXABAN 5 MG PO TABS: 5.0000 mg | ORAL_TABLET | Freq: Two times a day (BID) | ORAL | 0 refills | Status: AC

## 2024-07-17 NOTE — Telephone Encounter (Signed)
 Copied from CRM #[id number]. Topic: Clinical - Medication Refill >> Jul 17, 2024 10:18 AM Zebedee SAUNDERS wrote: Medication: ELIQUIS  5 MG TABS tablet  Has the patient contacted their pharmacy? Yes (Agent: If no, request that the patient contact the pharmacy for the refill. If patient does not wish to contact the pharmacy document the reason why and proceed with request.) (Agent: If yes, when and what did the pharmacy advise?)  This is the patient's preferred pharmacy:   [id number] [place], [place] - [address] AT [facility] OF [address] [place] [address] Phone: [phone] Fax: [phone]   Is this the correct pharmacy for this prescription? Yes If no, delete pharmacy and type the correct one.   Has the prescription been filled recently? Yes  Is the patient out of the medication? Yes  Has the patient been seen for an appointment in the last year OR does the patient have an upcoming appointment? Yes  Can we respond through MyChart? Yes  Agent: Please be advised that Rx refills may take up to 3 business days. We ask that you follow-up with your pharmacy.

## 2024-07-22 ENCOUNTER — Ambulatory Visit: Payer: Self-pay | Admitting: Internal Medicine

## 2024-07-22 DIAGNOSIS — I712 Thoracic aortic aneurysm, without rupture, unspecified: Secondary | ICD-10-CM

## 2024-08-11 ENCOUNTER — Ambulatory Visit

## 2024-08-11 VITALS — BP 144/78 | HR 68 | Resp 20 | Ht 72.0 in | Wt 266.2 lb

## 2024-08-11 DIAGNOSIS — I7121 Aneurysm of the ascending aorta, without rupture: Secondary | ICD-10-CM | POA: Diagnosis not present

## 2024-08-11 NOTE — Progress Notes (Signed)
 "      [address] [place] [address]             [id number]            James [name] [id number] 07-07-1946   History of Present Illness:  James [name] is a 79 year old man with medical history of hypertension, persistent atrial fibrillation, CHF, CAD, hypothyroidism, osteoarthritis, BPH and hyperlipidemia who presents for initial encounter of ascending thoracic aortic aneurysm.  On recent CT scan without contrast for a lung nodule, ascending thoracic aorta measured 5.0 cm.  On review of patients chart ascending aortic aneurysm has been present on CTA of chest since 2019 and has consistently measured 4.7 cm since.  Echocardiogram in 2020 showed that the aortic valve was normal in structure.   He presents to the clinic today with his wife and reports that he is doing well.  His blood pressure is slightly elevated at today's visit.  He reports home readings 120s or less/80s. Denies chest pain, shortness of breath.  Medications Ordered Prior to Encounter[1]   ROS: Review of Systems  Cardiovascular:  Negative for chest pain, palpitations and leg swelling.     BP (!) 144/78 (BP Location: Left Arm, Patient Position: Sitting, Cuff Size: Large)   Pulse 68   Resp 20   Ht 6' (1.829 m)   Wt 266 lb 3.2 oz (120.7 kg)   SpO2 97% Comment: RA  BMI 36.10 kg/m   Physical Exam Constitutional:      Appearance: Normal appearance.  HENT:     Head: Normocephalic and atraumatic.  Cardiovascular:     Rate and Rhythm: Normal rate and regular rhythm.     Heart sounds: Normal heart sounds, S1 normal and S2 normal.  Pulmonary:     Effort: Pulmonary effort is normal.     Breath sounds: Normal breath sounds.  Skin:    General: Skin is warm and dry.  Neurological:     General: No focal deficit present.     Mental Status: He is alert and oriented to person, place, and time.      Imaging: CLINICAL DATA:  Lung nodule.   EXAM: CT CHEST WITHOUT CONTRAST    TECHNIQUE: Multidetector CT imaging of the chest was performed following the standard protocol without IV contrast.   RADIATION DOSE REDUCTION: This exam was performed according to the departmental dose-optimization program which includes automated exposure control, adjustment of the mA and/or kV according to patient size and/or use of iterative reconstruction technique.   COMPARISON:  Chest CT dated 01/21/2024.   FINDINGS: Evaluation of this exam is limited in the absence of intravenous contrast.   Cardiovascular: There is no cardiomegaly. Trace pericardial effusion. Advanced 3 vessel coronary vascular calcification. There is a 5 cm ascending aortic aneurysm. There is mild atherosclerotic calcification of the thoracic aorta. The central pulmonary arteries are grossly unremarkable.   Mediastinum/Nodes: No hilar or mediastinal adenopathy. The esophagus is grossly unremarkable. No mediastinal fluid collection.   Lungs/Pleura: The previously seen left posterior subpleural nodule is not identified on today's exam. No suspicious nodules. No focal consolidation, pleural effusion, pneumothorax. The central airways are patent.   Upper Abdomen: No acute abnormality.   Musculoskeletal: Osteopenia with degenerative changes. No acute osseous pathology.   IMPRESSION: 1. No acute intrathoracic pathology. 2. The previously seen left posterior subpleural nodule is not identified on today's exam. 3. A 5 cm ascending aortic aneurysm. Ascending thoracic aortic  aneurysm. Recommend semi-annual imaging followup by CTA or MRA and referral to cardiothoracic surgery if not already obtained. This recommendation follows 2010 ACCF/AHA/AATS/ACR/ASA/SCA/SCAI/SIR/STS/SVM Guidelines for the Diagnosis and Management of Patients With Thoracic Aortic Disease. Circulation. 2010; 121: [id number]. Aortic aneurysm NOS (ICD10-I71.9) 4.  Aortic Atherosclerosis (ICD10-I70.0).     Electronically Signed   By:  Vanetta Chou M.D.   On: 07/21/2024 20:35     A/P: Aneurysm of ascending aorta without rupture -5.0  cm ascending thoracic aortic aneurysm on CT scan of chest without contrast. Previous echocardiogram showed that the aortic valve was normal structure. No aortic valve regurgitation or stenosis. -We discussed the natural history and and risk factors for growth of ascending aortic aneurysms. Discussed recommendations to minimize the risk of further expansion or dissection including careful blood pressure control, avoidance of contact sports and heavy lifting, attention to lipid management.  We covered the importance of continued smoking cessation.  The patient does not yet meet surgical criteria of >5.5cm. The patient is aware of signs and symptoms of aortic dissection and when to present to the emergency department   -Follow up in 6 months with CTA of chest for continued surveillance      Risk Modification:  Statin:  atorvastatin   Smoking cessation instruction/counseling given:  commended patient for quitting and reviewed strategies for preventing relapses  Patient was counseled on importance of Blood Pressure Control  They are instructed to contact their Primary Care Physician if they start to have blood pressure readings over 130s/90s. Do not ever stop blood pressure medications on your own, unless instructed by healthcare professional.  Please avoid use of Fluoroquinolones as this can potentially increase your risk of Aortic Rupture and/or Dissection  Patient educated on signs and symptoms of Aortic Dissection, handout also provided in AVS  James CHRISTELLA Rough, PA-C 08/11/2024     [1]  Current Outpatient Medications on File Prior to Visit  Medication Sig Dispense Refill   acetaminophen  (TYLENOL ) 500 MG tablet Take 1,000 mg by mouth every 6 (six) hours as needed for mild pain or moderate pain.     amoxicillin  (AMOXIL ) 500 MG tablet TAKE 4 TABLETS BY MOUTH 1 HOUR BEFORE DENTAL  PROCEDURE 10 tablet 0   apixaban  (ELIQUIS ) 5 MG TABS tablet Take 1 tablet (5 mg total) by mouth 2 (two) times daily. 180 tablet 0   atorvastatin  (LIPITOR ) 80 MG tablet TAKE 1 TABLET BY MOUTH DAILY AT 6:00PM 90 tablet 3   carboxymethylcellulose (REFRESH PLUS) 0.5 % SOLN Place 1-2 drops into both eyes 3 (three) times daily as needed (dry/irritated eyes.).     dofetilide  (TIKOSYN ) 500 MCG capsule Take 1 capsule (500 mcg total) by mouth 2 (two) times daily. 180 capsule 2   doxazosin  (CARDURA ) 4 MG tablet Take 1 tablet (4 mg total) by mouth daily. 90 tablet 3   ezetimibe  (ZETIA ) 10 MG tablet TAKE 1 TABLET BY MOUTH DAILY 90 tablet 3   finasteride  (PROSCAR ) 5 MG tablet TAKE 1 TABLET BY MOUTH EVERY DAY 90 tablet 3   furosemide  (LASIX ) 20 MG tablet TAKE 1 TABLET BY MOUTH EVERY OTHER DAY. GENERIC EQUIVALENT FOR LASIX  45 tablet 1   levothyroxine  (SYNTHROID ) 150 MCG tablet Take 1 tablet (150 mcg total) by mouth daily before breakfast. Except skip Sundays.  6 pill per week. 90 tablet 3   methocarbamol  (ROBAXIN ) 500 MG tablet Take 1 tablet (500 mg total) by mouth every 8 (eight) hours as needed for muscle spasms. 30 tablet 0   metoprolol   succinate (TOPROL -XL) 25 MG 24 hr tablet TAKE 1 TABLET(25 MG) BY MOUTH DAILY 90 tablet 2   Multiple Vitamin (MULTIVITAMIN WITH MINERALS) TABS tablet Take 1 tablet by mouth daily. Senior Multivitamin     psyllium (METAMUCIL) 58.6 % packet Take 2 packets by mouth daily as needed (constipation).     ramipril  (ALTACE ) 10 MG capsule Take 1 capsule (10 mg total) by mouth daily. 90 capsule 3   No current facility-administered medications on file prior to visit.   "

## 2024-08-11 NOTE — Patient Instructions (Signed)

## 2024-08-18 ENCOUNTER — Other Ambulatory Visit: Payer: Self-pay | Admitting: Family Medicine

## 2024-08-18 ENCOUNTER — Ambulatory Visit: Admitting: Cardiology

## 2024-08-18 NOTE — Telephone Encounter (Signed)
 Has chronically elevated creatinine. Last was 1.34 07-09-24 Will forward to Dr Cleatus for approval

## 2024-08-19 NOTE — Telephone Encounter (Signed)
 Okay to continue.  Rx sent.

## 2024-08-24 ENCOUNTER — Ambulatory Visit (INDEPENDENT_AMBULATORY_CARE_PROVIDER_SITE_OTHER): Payer: Medicare Other

## 2024-08-24 VITALS — Ht 72.0 in | Wt 250.0 lb

## 2024-08-24 DIAGNOSIS — Z0000 Encounter for general adult medical examination without abnormal findings: Secondary | ICD-10-CM | POA: Diagnosis not present

## 2024-08-24 NOTE — Progress Notes (Addendum)
 "  Chief Complaint  Patient presents with   Medicare Wellness     Subjective:   James [name] is a 79 y.o. male who presents for a Medicare Annual Wellness Visit.  Visit info / Clinical Intake: Medicare Wellness Visit Type:: Subsequent Annual Wellness Visit Persons participating in visit and providing information:: patient Medicare Wellness Visit Mode:: Telephone If telephone:: video declined Since this visit was completed virtually, some vitals may be partially provided or unavailable. Missing vitals are due to the limitations of the virtual format.: Documented vitals are patient reported If Telephone or Video please confirm:: I connected with patient using audio/video enable telemedicine. I verified patient identity with two identifiers, discussed telehealth limitations, and patient agreed to proceed. Patient Location:: Home Provider Location:: Office Interpreter Needed?: No Pre-visit prep was completed: yes AWV questionnaire completed by patient prior to visit?: no Living arrangements:: lives with spouse/significant other Patient's Overall Health Status Rating: good Typical amount of pain: none Does pain affect daily life?: no Are you currently prescribed opioids?: no  Dietary Habits and Nutritional Risks How many meals a day?: 3 Eats fruit and vegetables daily?: yes Most meals are obtained by: preparing own meals In the last 2 weeks, have you had any of the following?: none Diabetic:: no  Functional Status Activities of Daily Living (to include ambulation/medication): Independent Ambulation: Independent with device- listed below Home Assistive Devices/Equipment: Eyeglasses Medication Administration: Independent Home Management (perform basic housework or laundry): Independent Manage your own finances?: yes Primary transportation is: driving Concerns about vision?: no *vision screening is required for WTM* Concerns about hearing?: no  Fall Screening Falls in the past  year?: 0 Number of falls in past year: 0 Was there an injury with Fall?: 0 Fall Risk Category Calculator: 0 Patient Fall Risk Level: Low Fall Risk  Fall Risk Patient at Risk for Falls Due to: No Fall Risks Fall risk Follow up: Falls evaluation completed; Falls prevention discussed  Home and Transportation Safety: All rugs have non-skid backing?: N/A, no rugs All stairs or steps have railings?: N/A, no stairs Grab bars in the bathtub or shower?: (!) no Have non-skid surface in bathtub or shower?: yes Good home lighting?: yes Regular seat belt use?: yes Hospital stays in the last year:: no  Cognitive Assessment Difficulty concentrating, remembering, or making decisions? : no Will 6CIT or Mini Cog be Completed: yes What year is it?: 0 points What month is it?: 0 points Give patient an address phrase to remember (5 components): [address] [place], [place] About what time is it?: 0 points Count backwards from 20 to 1: 0 points Say the months of the year in reverse: 0 points Repeat the address phrase from earlier: 0 points 6 CIT Score: 0 points  Advance Directives (For Healthcare) Does Patient Have a Medical Advance Directive?: Yes Does patient want to make changes to medical advance directive?: Yes (Inpatient - patient requests chaplain consult to change a medical advance directive) Type of Advance Directive: Healthcare Power of [place]; Living will Copy of Healthcare Power of Attorney in Chart?: Yes - validated most recent copy scanned in chart (See row information) Copy of Living Will in Chart?: Yes - validated most recent copy scanned in chart (See row information)  Reviewed/Updated  Reviewed/Updated: Reviewed All (Medical, Surgical, Family, Medications, Allergies, Care Teams, Patient Goals)    Allergies (verified) Quinolones and Sulfonamide derivatives   Current Medications (verified) Outpatient Encounter Medications as of 08/24/2024  Medication Sig   acetaminophen   (TYLENOL ) 500 MG tablet  Take 1,000 mg by mouth every 6 (six) hours as needed for mild pain or moderate pain.   amoxicillin  (AMOXIL ) 500 MG tablet TAKE 4 TABLETS BY MOUTH 1 HOUR BEFORE DENTAL PROCEDURE (Patient taking differently: Take 500 mg by mouth 2 (two) times daily. PRN - TAKE 4 TABLETS BY MOUTH 1 HOUR BEFORE DENTAL PROCEDURE)   apixaban  (ELIQUIS ) 5 MG TABS tablet Take 1 tablet (5 mg total) by mouth 2 (two) times daily.   atorvastatin  (LIPITOR ) 80 MG tablet TAKE 1 TABLET BY MOUTH DAILY AT 6:00PM   carboxymethylcellulose (REFRESH PLUS) 0.5 % SOLN Place 1-2 drops into both eyes 3 (three) times daily as needed (dry/irritated eyes.).   dofetilide  (TIKOSYN ) 500 MCG capsule Take 1 capsule (500 mcg total) by mouth 2 (two) times daily.   doxazosin  (CARDURA ) 4 MG tablet Take 1 tablet (4 mg total) by mouth daily.   ezetimibe  (ZETIA ) 10 MG tablet TAKE 1 TABLET BY MOUTH DAILY   finasteride  (PROSCAR ) 5 MG tablet TAKE 1 TABLET BY MOUTH EVERY DAY   furosemide  (LASIX ) 20 MG tablet TAKE 1 TABLET BY MOUTH EVERY OTHER DAY. GENERIC EQUIVALENT FOR LASIX    levothyroxine  (SYNTHROID ) 150 MCG tablet Take 1 tablet (150 mcg total) by mouth daily before breakfast. Except skip Sundays.  6 pill per week.   methocarbamol  (ROBAXIN ) 500 MG tablet Take 1 tablet (500 mg total) by mouth every 8 (eight) hours as needed for muscle spasms.   metoprolol  succinate (TOPROL -XL) 25 MG 24 hr tablet TAKE 1 TABLET(25 MG) BY MOUTH DAILY   Multiple Vitamin (MULTIVITAMIN WITH MINERALS) TABS tablet Take 1 tablet by mouth daily. Senior Multivitamin   psyllium (METAMUCIL) 58.6 % packet Take 2 packets by mouth daily as needed (constipation).   ramipril  (ALTACE ) 10 MG capsule Take 1 capsule (10 mg total) by mouth daily.   No facility-administered encounter medications on file as of 08/24/2024.    History: Past Medical History:  Diagnosis Date   (HFpEF) heart failure with preserved ejection fraction (HCC)    a. 05/2018 Echo: EF 55-60%, gr2 DD.    Acute lower GI bleeding after colonoscopy and polypectomy, resolved 12/12/2015   Ascending aortic aneurysm    a. 02/2018 Echo: mildly dil Ao root/asc ao/arch; b. 05/2018 CTA Chest: 4.7cm fusiform aneurysm of Asc Ao.   CAD (coronary artery disease)    a. 05/2018 Cath/PCI: LM nl,LAD 30p, 90/99d/apical, LCX 30m, OM1/2/3 min irregs, RCA 85p (3.5x18 Sierra DES).   Cardiac murmur    a. 02/2018 Echo: EF 60-65%, no rwma, mild AI, mildly dil Ao root/Asc Ao/Arch, Mild MR, mildly dil LA. Nl RV fxn.   Cataract    bilateral surgery to remove   CHF (congestive heart failure) (HCC)    Colon polyp    Constipation    Dysrhythmia    Hx A. Fib   Essential hypertension    Hemorrhoids    Hepatitis    self resolved, likely food exposure, 1974.    History of cardiovascular stress test    a. 02/2018 Myoview : EF 55-65%, small/mild apical defect w/ nl wall motion ->attenuation artifact. No ischemia. Low risk study.   HTN (hypertension)    Hx of adenomatous colonic polyps 12/05/2015   Hyperlipidemia    Hypothyroidism    Mitral regurgitation    a. 110/2019 Echo: EF 55-60%. Gr2 DD. Triv AI. Ao root 38mm. Mild MR. Mod dil LA, mildly dil RA.   Osteoarthritis    Oxygen deficiency    Persistent atrial fibrillation (HCC)  a. Dx 02/2018. CHA2DS2VASc = 2-->Eliquis ; b. 03/2018 DCCV (200J); c. 03/2018 recurrent Afib-->flecainide  started 04/2018;  d. 05/06/2018 s/p successful DCCV - 200J x 2-->on amio.   Skin cancer    basal cell, R ear, MOHS   Visit for monitoring Tikosyn  therapy    Past Surgical History:  Procedure Laterality Date   BROW LIFT Bilateral 10/23/2016   Procedure: BLEPHAROPLASTY upper eyelid with excess skin;  Surgeon: Greig CHRISTELLA Gay, MD;  Location: [facility];  Service: Ophthalmology;  Laterality: Bilateral;  MAC   CARDIOVERSION N/A 03/18/2018   Procedure: CARDIOVERSION;  Surgeon: Mady Bruckner, MD;  Location: [facility];  Service: Cardiovascular;  Laterality: N/A;   CARDIOVERSION N/A 05/06/2018    Procedure: CARDIOVERSION;  Surgeon: Mady Bruckner, MD;  Location: [facility];  Service: Cardiovascular;  Laterality: N/A;   CATARACT EXTRACTION  1986   OD   CATARACT EXTRACTION Bilateral 1995   x 2 for right and left    COLONOSCOPY     CORONARY STENT INTERVENTION N/A 05/13/2018   Procedure: CORONARY STENT INTERVENTION;  Surgeon: Mady Bruckner, MD;  Location: [facility];  Service: Cardiovascular;  Laterality: N/A;   ECTROPION REPAIR Bilateral 10/23/2016   Procedure: REPAIR OF ECTROPION sutures, extensive;  Surgeon: Greig CHRISTELLA Gay, MD;  Location: [facility];  Service: Ophthalmology;  Laterality: Bilateral;   LIPOMA EXCISION  06/1997   left neck (Juengel)   MOHS SURGERY Right 2013   behind right ear   RIGHT/LEFT HEART CATH AND CORONARY ANGIOGRAPHY N/A 05/12/2018   Procedure: RIGHT/LEFT HEART CATH AND CORONARY ANGIOGRAPHY;  Surgeon: Darron Deatrice LABOR, MD;  Location: [facility];  Service: Cardiovascular;  Laterality: N/A;   TONSILLECTOMY     TOTAL KNEE ARTHROPLASTY Left 01/17/2023   Procedure: LEFT TOTAL KNEE ARTHROPLASTY;  Surgeon: Addie Cordella Hamilton, MD;  Location[facility];  Service: Orthopedics;  Laterality: Left;   VASECTOMY  1982   Family History  Problem Relation Age of Onset   Stroke Mother    Lung cancer Maternal Grandfather        smoker   Stroke Maternal Grandfather    Heart disease Paternal Grandfather        MI, old age   Colon cancer Neg Hx    Colon polyps Neg Hx    Esophageal cancer Neg Hx    Rectal cancer Neg Hx    Stomach cancer Neg Hx    Bladder Cancer Neg Hx    Prostate cancer Neg Hx    Social History   Occupational History   Occupation: Retired    Associate Professor: RETIRED    Comment: 1  Tobacco Use   Smoking status: Former    Current packs/day: 0.00    Average packs/day: 2.0 packs/day for 20.0 years (40.0 ttl pk-yrs)    Types: Cigarettes    Start date: 08/07/1963    Quit date: 08/07/1983    Years since quitting: 41.0    Passive exposure:  Past   Smokeless tobacco: Never   Tobacco comments:    Former smoker 11/17/21 quit over 40 years ago  Vaping Use   Vaping status: Never Used  Substance and Sexual Activity   Alcohol  use: Yes    Alcohol /week: 1.0 standard drink of alcohol     Types: 1 Glasses of wine per week    Comment: 1-2 per day   Drug use: No   Sexual activity: Yes    Partners: Female   Tobacco Counseling Counseling given: Yes Tobacco comments: Former smoker 11/17/21  quit over 40 years ago  SDOH Screenings   Food Insecurity: No Food Insecurity (08/24/2024)  Housing: Unknown (08/24/2024)  Transportation Needs: No Transportation Needs (08/24/2024)  Utilities: Not At Risk (08/24/2024)  Alcohol  Screen: Low Risk (08/21/2023)  Depression (PHQ2-9): Low Risk (08/24/2024)  Financial Resource Strain: Low Risk (08/21/2023)  Physical Activity: Inactive (08/24/2024)  Social Connections: Moderately Integrated (08/24/2024)  Stress: No Stress Concern Present (08/24/2024)  Tobacco Use: Medium Risk (08/24/2024)  Health Literacy: Adequate Health Literacy (08/24/2024)   See flowsheets for full screening details  Depression Screen PHQ 2 & 9 Depression Scale- Over the past 2 weeks, how often have you been bothered by any of the following problems? Little interest or pleasure in doing things: 0 Feeling down, depressed, or hopeless (PHQ Adolescent also includes...irritable): 0 PHQ-2 Total Score: 0 Trouble falling or staying asleep, or sleeping too much: 0 Feeling tired or having little energy: 0 Poor appetite or overeating (PHQ Adolescent also includes...weight loss): 0 Feeling bad about yourself - or that you are a failure or have let yourself or your family down: 0 Trouble concentrating on things, such as reading the newspaper or watching television (PHQ Adolescent also includes...like school work): 0 Moving or speaking so slowly that other people could have noticed. Or the opposite - being so fidgety or restless that you have been  moving around a lot more than usual: 0 Thoughts that you would be better off dead, or of hurting yourself in some way: 0 PHQ-9 Total Score: 0 If you checked off any problems, how difficult have these problems made it for you to do your work, take care of things at home, or get along with other people?: Not difficult at all  Depression Treatment Depression Interventions/Treatment : EYV7-0 Score <4 Follow-up Not Indicated     Goals Addressed               This Visit's Progress     Patient Stated (pt-stated)        Patient stated he plans to stay active             Objective:    Today's Vitals   08/24/24 0806  Weight: 250 lb (113.4 kg)  Height: 6' (1.829 m)   Body mass index is 33.91 kg/m.  Hearing/Vision screen Hearing Screening - Comments:: Denies hearing difficulties   Vision Screening - Comments:: Wears rx glasses - up to date with routine eye exams with [facility] Immunizations and Health Maintenance Health Maintenance  Topic Date Due   DTaP/Tdap/Td (2 - Tdap) 09/29/2024 (Originally 02/18/2018)   COVID-19 Vaccine (9 - Pfizer risk 2025-26 season) 10/20/2024   Medicare Annual Wellness (AWV)  08/24/2025   Pneumococcal Vaccine: 50+ Years  Completed   Influenza Vaccine  Completed   Hepatitis C Screening  Completed   Zoster Vaccines- Shingrix  Completed   Meningococcal B Vaccine  Aged Out   Colonoscopy  Discontinued        Assessment/Plan:  This is a routine wellness examination for [facility].  Patient Care Team: Cleatus Arlyss RAMAN, MD as PCP - General (Family Medicine) End, Lonni, MD as PCP - Cardiology (Cardiology) Cindie Ole DASEN, MD (Inactive) as PCP - Electrophysiology (Cardiology) Verla Amos, Paramedic as Paramedic (Emergency Medicine) Avram Lupita BRAVO, MD as Consulting Physician (Gastroenterology) Riddle, Suzann, NP as Nurse Practitioner (Clinical Cardiac Electrophysiology)  I have personally reviewed and noted the following in the  patients chart:   Medical and social history Use of alcohol , tobacco or illicit drugs  Current medications and supplements including opioid prescriptions. Functional ability and status Nutritional status Physical activity Advanced directives List of other physicians Hospitalizations, surgeries, and ER visits in previous 12 months Vitals Screenings to include cognitive, depression, and falls Referrals and appointments  No orders of the defined types were placed in this encounter.  In addition, I have reviewed and discussed with patient certain preventive protocols, quality metrics, and best practice recommendations. A written personalized care plan for preventive services as well as general preventive health recommendations were provided to patient.   Verdie CHRISTELLA Saba, CMA   08/24/2024   Return in 1 year (on 08/24/2025).  After Visit Summary: (MyChart) Due to this being a telephonic visit, the after visit summary with patients personalized plan was offered to patient via MyChart   Nurse Notes: scheduled 2027 AWV sppt "

## 2024-08-24 NOTE — Patient Instructions (Addendum)
 James [name],  Thank you for taking the time for your Medicare Wellness Visit. I appreciate your continued commitment to your health goals. Please review the care plan we discussed, and feel free to reach out if I can assist you further.  Please note that Annual Wellness Visits do not include a physical exam. Some assessments may be limited, especially if the visit was conducted virtually. If needed, we may recommend an in-person follow-up with your provider.  Ongoing Care Seeing your primary care provider every 3 to 6 months helps us  monitor your health and provide consistent, personalized care.   Referrals If a referral was made during today's visit and you haven't received any updates within two weeks, please contact the referred provider directly to check on the status.  Recommended Screenings:  Health Maintenance  Topic Date Due   DTaP/Tdap/Td vaccine (2 - Tdap) 09/29/2024*   COVID-19 Vaccine (9 - Pfizer risk 2025-26 season) 10/20/2024   Medicare Annual Wellness Visit  08/24/2025   Pneumococcal Vaccine for age over 66  Completed   Flu Shot  Completed   Hepatitis C Screening  Completed   Zoster (Shingles) Vaccine  Completed   Meningitis B Vaccine  Aged Out   Colon Cancer Screening  Discontinued  *Topic was postponed. The date shown is not the original due date.       08/24/2024    8:09 AM  Advanced Directives  Does Patient Have a Medical Advance Directive? Yes  Type of Estate Agent of [place];Living will  Does patient want to make changes to medical advance directive? Yes (Inpatient - patient requests chaplain consult to change a medical advance directive)  Copy of Healthcare Power of Attorney in Chart? Yes - validated most recent copy scanned in chart (See row information)    Vision: Annual vision screenings are recommended for early detection of glaucoma, cataracts, and diabetic retinopathy. These exams can also reveal signs of chronic conditions such as  diabetes and high blood pressure.  Dental: Annual dental screenings help detect early signs of oral cancer, gum disease, and other conditions linked to overall health, including heart disease and diabetes.

## 2024-09-09 ENCOUNTER — Ambulatory Visit: Admitting: Internal Medicine

## 2024-09-09 ENCOUNTER — Encounter: Payer: Self-pay | Admitting: Internal Medicine

## 2024-09-09 VITALS — BP 120/70 | HR 54 | Ht 72.0 in | Wt 267.4 lb

## 2024-09-09 DIAGNOSIS — I4819 Other persistent atrial fibrillation: Secondary | ICD-10-CM

## 2024-09-09 DIAGNOSIS — I10 Essential (primary) hypertension: Secondary | ICD-10-CM

## 2024-09-09 DIAGNOSIS — I5032 Chronic diastolic (congestive) heart failure: Secondary | ICD-10-CM | POA: Diagnosis not present

## 2024-09-09 DIAGNOSIS — I2510 Atherosclerotic heart disease of native coronary artery without angina pectoris: Secondary | ICD-10-CM

## 2024-09-09 DIAGNOSIS — E785 Hyperlipidemia, unspecified: Secondary | ICD-10-CM

## 2024-09-09 DIAGNOSIS — I712 Thoracic aortic aneurysm, without rupture, unspecified: Secondary | ICD-10-CM

## 2024-09-09 DIAGNOSIS — I480 Paroxysmal atrial fibrillation: Secondary | ICD-10-CM

## 2024-09-09 NOTE — Patient Instructions (Signed)

## 2024-09-09 NOTE — Progress Notes (Unsigned)
 " Cardiology Office Note:  .   Date:  09/10/2024  ID:  James [name], DOB [birth year], MRN [id number] PCP: [name] Arlyss RAMAN, MD  James [name] Cardiologist:  Lonni Hanson, MD Electrophysiologist:  Fonda Kitty, MD  Electrophysiology APP:  Riddle, Suzann, NP     History of Present Illness: .   James [name] is a 79 y.o. male with history of coronary artery disease status post PCI to the RCA in 2019, paroxysmal atrial fibrillation, thoracic aortic aneurysm, chronic HFpEF, hypertension, hyperlipidemia, hypothyroidism, and obesity, who presents for follow-up of CAD, TAA, and atrial fibrillation.  I last saw him a year ago, at which time he reported a single brief episode of an odd sensation in his chest that he thought may have been atrial fibrillation.  He was otherwise feeling well.  Follow-up CTA of the chest in 01/2024 showed stable moderate dilation of the ascending aorta at 4.7 cm.  It was measured at 5 cm on unenhanced CT of the chest in 07/2024.  James [name] was referred to cardiac surgery with plans to reimage his aorta in 6 months.  Today, James [name] reports that he is feeling well with fewer palpitations compared to when he saw James Needle, NP, in December.  He had some palpitations yesterday, though he notes that his [facility] device indicated only sinus bradycardia.  He denies chest pain, shortness of breath, and lightheadedness.  Chronic lower extremity edema is stable.  ROS: See HPI  Studies Reviewed: SABRA   EKG Interpretation Date/Time:  Wednesday September 09 2024 09:43:57 EST Ventricular Rate:  54 PR Interval:  188 QRS Duration:  94 QT Interval:  442 QTC Calculation: 419 R Axis:   -41  Text Interpretation: Sinus bradycardia Left anterior fasicular block Abnormal ECG When compared with ECG of 09-Jul-2024 09:14, HEART RATE has decreased Otherwise no significant change Confirmed by James [name], Lonni [phone]) on 09/10/2024 12:09:12 PM    Risk  Assessment/Calculations:    CHA2DS2-VASc Score = 5   This indicates a 7.2% annual risk of stroke. The patient's score is based upon: CHF History: 1 HTN History: 1 Diabetes History: 0 Stroke History: 0 Vascular Disease History: 1 Age Score: 2 Gender Score: 0            Physical Exam:   VS:  BP 120/70 (BP Location: Left Arm, Patient Position: Sitting, Cuff Size: Normal)   Pulse (!) 54 Comment: 70 oximeter  Ht 6' (1.829 m)   Wt 267 lb 6.4 oz (121.3 kg)   SpO2 100%   BMI 36.27 kg/m    Wt Readings from Last 3 Encounters:  09/09/24 267 lb 6.4 oz (121.3 kg)  08/24/24 250 lb (113.4 kg)  08/11/24 266 lb 3.2 oz (120.7 kg)    General:  NAD. Neck: No JVD or HJR. Lungs: Clear to auscultation bilaterally without wheezes or crackles. Heart: Regular rate and rhythm without murmurs, rubs, or gallops. Abdomen: Soft, nontender, nondistended. Extremities: 1+ pretibial edema bilaterally.  ASSESSMENT AND PLAN: .    Paroxysmal atrial fibrillation: James [name] is in sinus rhythm today and reports fewer palpitations since his visit with Mr. [name] in December.  Continue apixaban , dofetilide , and metoprolol  with ongoing management through EP.  QTc remains normal today.  Coronary artery disease: No angina reported.  Continue atorvastatin  and ezetimibe , and apixaban  and lieu of aspirin  for secondary prevention.  Thoracic aortic aneurysm: Patient now established with cardiac surgery with plans for reimaging of his ascending aortic aneurysm most relationally measured  at 5.0 cm around 01/2025.  Continue blood pressure and lipid control.  Fluoroquinolones should be avoided if possible.  Chronic HFpEF: James [name] reports stable NYHA class II symptoms.  His chronic lower extremity edema is unchanged.  Continue furosemide  20 mg every other day.  Hypertension: BP well-controlled.  Continue current regimen of metoprolol  and ramipril  as well as doxazosin  (also used for BPH).  Hyperlipidemia: Lipids at  goal on last check a year ago.  Continue current regimen of atorvastatin  and ezetimibe ; repeat labs to be drawn at upcoming physical with James [name].    Dispo: Return to clinic in 1 year.  Signed, Lonni Hanson, MD  "

## 2024-09-10 ENCOUNTER — Encounter: Payer: Self-pay | Admitting: Internal Medicine

## 2024-10-01 ENCOUNTER — Encounter: Admitting: Family Medicine

## 2024-10-27 ENCOUNTER — Ambulatory Visit: Admitting: Cardiology

## 2025-08-25 ENCOUNTER — Ambulatory Visit
